# Patient Record
Sex: Female | Born: 1970
Health system: Southern US, Community
[De-identification: ages and names within clinical notes are randomized; demographics above are authoritative.]

## PROBLEM LIST (undated history)

## (undated) DIAGNOSIS — I951 Orthostatic hypotension: Secondary | ICD-10-CM

## (undated) DIAGNOSIS — M62838 Other muscle spasm: Secondary | ICD-10-CM

## (undated) DIAGNOSIS — R000 Tachycardia, unspecified: Secondary | ICD-10-CM

## (undated) DIAGNOSIS — R52 Pain, unspecified: Secondary | ICD-10-CM

## (undated) DIAGNOSIS — G43909 Migraine, unspecified, not intractable, without status migrainosus: Secondary | ICD-10-CM

## (undated) DIAGNOSIS — S3769XA Other injury of uterus, initial encounter: Secondary | ICD-10-CM

## (undated) DIAGNOSIS — I498 Other specified cardiac arrhythmias: Secondary | ICD-10-CM

## (undated) DIAGNOSIS — F319 Bipolar disorder, unspecified: Secondary | ICD-10-CM

## (undated) DIAGNOSIS — R55 Syncope and collapse: Secondary | ICD-10-CM

## (undated) DIAGNOSIS — G90A Postural orthostatic tachycardia syndrome (POTS): Secondary | ICD-10-CM

## (undated) DIAGNOSIS — F410 Panic disorder [episodic paroxysmal anxiety] without agoraphobia: Secondary | ICD-10-CM

## (undated) DIAGNOSIS — M797 Fibromyalgia: Secondary | ICD-10-CM

## (undated) DIAGNOSIS — M1990 Unspecified osteoarthritis, unspecified site: Secondary | ICD-10-CM

## (undated) HISTORY — PX: DILATION AND CURETTAGE OF UTERUS: SHX78

## (undated) HISTORY — DX: Syncope and collapse: R55

## (undated) HISTORY — DX: Other injury of uterus, initial encounter: S37.69XA

## (undated) HISTORY — DX: Other muscle spasm: M62.838

## (undated) HISTORY — DX: Migraine, unspecified, not intractable, without status migrainosus: G43.909

## (undated) HISTORY — DX: Panic disorder (episodic paroxysmal anxiety): F41.0

---

## 1998-11-16 ENCOUNTER — Emergency Department (HOSPITAL_COMMUNITY): Admission: EM | Admit: 1998-11-16 | Discharge: 1998-11-16 | Payer: Self-pay | Admitting: Emergency Medicine

## 1999-09-17 ENCOUNTER — Other Ambulatory Visit: Admission: RE | Admit: 1999-09-17 | Discharge: 1999-09-17 | Payer: Self-pay | Admitting: *Deleted

## 2000-11-16 ENCOUNTER — Other Ambulatory Visit: Admission: RE | Admit: 2000-11-16 | Discharge: 2000-11-16 | Payer: Self-pay | Admitting: *Deleted

## 2001-12-04 ENCOUNTER — Other Ambulatory Visit: Admission: RE | Admit: 2001-12-04 | Discharge: 2001-12-04 | Payer: Self-pay | Admitting: *Deleted

## 2002-12-06 ENCOUNTER — Other Ambulatory Visit: Admission: RE | Admit: 2002-12-06 | Discharge: 2002-12-06 | Payer: Self-pay | Admitting: Obstetrics and Gynecology

## 2004-05-21 ENCOUNTER — Observation Stay (HOSPITAL_COMMUNITY): Admission: AD | Admit: 2004-05-21 | Discharge: 2004-05-22 | Payer: Self-pay | Admitting: Obstetrics & Gynecology

## 2005-02-03 ENCOUNTER — Inpatient Hospital Stay: Payer: Self-pay | Admitting: Unknown Physician Specialty

## 2006-11-27 ENCOUNTER — Other Ambulatory Visit: Payer: Self-pay

## 2006-11-28 ENCOUNTER — Observation Stay: Payer: Self-pay | Admitting: Internal Medicine

## 2007-12-11 ENCOUNTER — Emergency Department: Payer: Self-pay | Admitting: Internal Medicine

## 2008-05-28 ENCOUNTER — Emergency Department: Payer: Self-pay | Admitting: Emergency Medicine

## 2009-03-11 ENCOUNTER — Emergency Department: Payer: Self-pay | Admitting: Emergency Medicine

## 2009-03-24 ENCOUNTER — Emergency Department: Payer: Self-pay | Admitting: Emergency Medicine

## 2009-04-30 ENCOUNTER — Inpatient Hospital Stay: Payer: Self-pay | Admitting: Psychiatry

## 2009-10-05 ENCOUNTER — Ambulatory Visit: Payer: Self-pay | Admitting: Internal Medicine

## 2009-10-05 ENCOUNTER — Emergency Department: Payer: Self-pay | Admitting: Emergency Medicine

## 2009-12-26 ENCOUNTER — Emergency Department: Payer: Self-pay | Admitting: Emergency Medicine

## 2010-01-21 ENCOUNTER — Emergency Department: Payer: Self-pay | Admitting: Internal Medicine

## 2010-06-23 ENCOUNTER — Emergency Department: Payer: Self-pay | Admitting: Emergency Medicine

## 2010-09-30 ENCOUNTER — Other Ambulatory Visit: Payer: Self-pay | Admitting: Obstetrics and Gynecology

## 2010-11-12 ENCOUNTER — Emergency Department: Payer: Self-pay

## 2010-12-02 ENCOUNTER — Emergency Department: Payer: Self-pay | Admitting: Emergency Medicine

## 2010-12-08 ENCOUNTER — Emergency Department: Payer: Self-pay | Admitting: *Deleted

## 2010-12-29 ENCOUNTER — Observation Stay: Payer: Self-pay | Admitting: Internal Medicine

## 2011-02-24 ENCOUNTER — Emergency Department: Payer: Self-pay | Admitting: Emergency Medicine

## 2011-06-02 ENCOUNTER — Inpatient Hospital Stay: Payer: Self-pay | Admitting: Internal Medicine

## 2011-08-08 ENCOUNTER — Emergency Department: Payer: Self-pay | Admitting: Emergency Medicine

## 2011-08-08 LAB — COMPREHENSIVE METABOLIC PANEL
Albumin: 3.8 g/dL (ref 3.4–5.0)
Alkaline Phosphatase: 68 U/L (ref 50–136)
Anion Gap: 10 (ref 7–16)
BUN: 10 mg/dL (ref 7–18)
Bilirubin,Total: 0.3 mg/dL (ref 0.2–1.0)
Calcium, Total: 8.2 mg/dL — ABNORMAL LOW (ref 8.5–10.1)
Creatinine: 0.95 mg/dL (ref 0.60–1.30)
SGPT (ALT): 22 U/L
Total Protein: 6.7 g/dL (ref 6.4–8.2)

## 2011-08-08 LAB — CBC WITH DIFFERENTIAL/PLATELET
Basophil #: 0.1 10*3/uL (ref 0.0–0.1)
Basophil %: 1.1 %
Eosinophil #: 0.1 10*3/uL (ref 0.0–0.7)
Eosinophil %: 0.8 %
HCT: 40.6 % (ref 35.0–47.0)
HGB: 13.6 g/dL (ref 12.0–16.0)
Lymphocyte %: 31 %
MCH: 28.7 pg (ref 26.0–34.0)
MCHC: 33.6 g/dL (ref 32.0–36.0)
MCV: 86 fL (ref 80–100)
Monocyte #: 0.4 10*3/uL (ref 0.0–0.7)
Neutrophil %: 61.8 %
RBC: 4.75 10*6/uL (ref 3.80–5.20)

## 2011-08-27 ENCOUNTER — Emergency Department: Payer: Self-pay | Admitting: Internal Medicine

## 2011-08-27 LAB — URINALYSIS, COMPLETE
Bacteria: NONE SEEN
Bilirubin,UR: NEGATIVE
Glucose,UR: NEGATIVE mg/dL (ref 0–75)
Ketone: NEGATIVE
Leukocyte Esterase: NEGATIVE
Nitrite: NEGATIVE
Ph: 7 (ref 4.5–8.0)
Squamous Epithelial: <1
WBC UR: 2 /HPF (ref 0–5)

## 2011-08-27 LAB — DRUG SCREEN, URINE
Amphetamines, Ur Screen: NEGATIVE (ref ?–1000)
Barbiturates, Ur Screen: NEGATIVE (ref ?–200)
Benzodiazepine, Ur Scrn: NEGATIVE (ref ?–200)
Cocaine Metabolite,Ur ~~LOC~~: NEGATIVE (ref ?–300)
MDMA (Ecstasy)Ur Screen: NEGATIVE (ref ?–500)
Methadone, Ur Screen: NEGATIVE (ref ?–300)
Tricyclic, Ur Screen: NEGATIVE (ref ?–1000)

## 2011-08-27 LAB — COMPREHENSIVE METABOLIC PANEL
Anion Gap: 10 (ref 7–16)
BUN: 8 mg/dL (ref 7–18)
Bilirubin,Total: 0.2 mg/dL (ref 0.2–1.0)
Calcium, Total: 8.1 mg/dL — ABNORMAL LOW (ref 8.5–10.1)
Chloride: 112 mmol/L — ABNORMAL HIGH (ref 98–107)
Co2: 22 mmol/L (ref 21–32)
EGFR (African American): >60
EGFR (Non-African Amer.): >60
Potassium: 3.7 mmol/L (ref 3.5–5.1)
SGOT(AST): 24 U/L (ref 15–37)
SGPT (ALT): 21 U/L
Total Protein: 6.7 g/dL (ref 6.4–8.2)

## 2011-08-27 LAB — CBC
Platelet: 184 10*3/uL (ref 150–440)
RBC: 4.45 10*6/uL (ref 3.80–5.20)
RDW: 15.2 % — ABNORMAL HIGH (ref 11.5–14.5)
WBC: 10 10*3/uL (ref 3.6–11.0)

## 2011-08-27 LAB — HCG, QUANTITATIVE, PREGNANCY: Beta Hcg, Quant.: <1 m[IU]/mL — ABNORMAL LOW

## 2011-08-27 LAB — TROPONIN I: Troponin-I: <0.02 ng/mL

## 2011-08-27 LAB — PREGNANCY, URINE: Pregnancy Test, Urine: NEGATIVE m[IU]/mL

## 2011-08-27 LAB — CK TOTAL AND CKMB (NOT AT ARMC): CK, Total: 65 U/L (ref 21–215)

## 2011-09-10 DIAGNOSIS — F329 Major depressive disorder, single episode, unspecified: Secondary | ICD-10-CM | POA: Insufficient documentation

## 2011-09-10 DIAGNOSIS — K219 Gastro-esophageal reflux disease without esophagitis: Secondary | ICD-10-CM | POA: Insufficient documentation

## 2011-09-10 DIAGNOSIS — G478 Other sleep disorders: Secondary | ICD-10-CM | POA: Insufficient documentation

## 2011-09-10 DIAGNOSIS — R42 Dizziness and giddiness: Secondary | ICD-10-CM | POA: Insufficient documentation

## 2011-09-10 DIAGNOSIS — R5383 Other fatigue: Secondary | ICD-10-CM | POA: Insufficient documentation

## 2011-09-10 DIAGNOSIS — F32A Depression, unspecified: Secondary | ICD-10-CM | POA: Insufficient documentation

## 2011-09-10 DIAGNOSIS — R5381 Other malaise: Secondary | ICD-10-CM | POA: Insufficient documentation

## 2011-09-10 DIAGNOSIS — G479 Sleep disorder, unspecified: Secondary | ICD-10-CM | POA: Insufficient documentation

## 2011-09-20 DIAGNOSIS — R102 Pelvic and perineal pain: Secondary | ICD-10-CM | POA: Insufficient documentation

## 2012-02-03 ENCOUNTER — Inpatient Hospital Stay: Payer: Self-pay | Admitting: Internal Medicine

## 2012-02-03 LAB — CBC WITH DIFFERENTIAL/PLATELET
Basophil #: 0.1 10*3/uL (ref 0.0–0.1)
Eosinophil #: 0.1 10*3/uL (ref 0.0–0.7)
Eosinophil %: 1.5 %
HCT: 39.3 % (ref 35.0–47.0)
Lymphocyte #: 3.2 10*3/uL (ref 1.0–3.6)
MCHC: 32.8 g/dL (ref 32.0–36.0)
MCV: 85 fL (ref 80–100)
Monocyte %: 6.8 %
Neutrophil #: 5.5 10*3/uL (ref 1.4–6.5)
RBC: 4.6 10*6/uL (ref 3.80–5.20)
RDW: 14.7 % — ABNORMAL HIGH (ref 11.5–14.5)
WBC: 9.7 10*3/uL (ref 3.6–11.0)

## 2012-02-03 LAB — COMPREHENSIVE METABOLIC PANEL
Albumin: 3.5 g/dL (ref 3.4–5.0)
Alkaline Phosphatase: 64 U/L (ref 50–136)
Bilirubin,Total: 0.2 mg/dL (ref 0.2–1.0)
Calcium, Total: 8.2 mg/dL — ABNORMAL LOW (ref 8.5–10.1)
Co2: 21 mmol/L (ref 21–32)
Creatinine: 0.99 mg/dL (ref 0.60–1.30)
Glucose: 83 mg/dL (ref 65–99)
Osmolality: 282 (ref 275–301)
SGPT (ALT): 23 U/L (ref 12–78)

## 2012-02-03 LAB — CK TOTAL AND CKMB (NOT AT ARMC): CK-MB: 0.6 ng/mL (ref 0.5–3.6)

## 2012-02-04 LAB — LIPID PANEL
Cholesterol: 203 mg/dL — ABNORMAL HIGH (ref 0–200)
Ldl Cholesterol, Calc: 114 mg/dL — ABNORMAL HIGH (ref 0–100)
Triglycerides: 300 mg/dL — ABNORMAL HIGH (ref 0–200)
VLDL Cholesterol, Calc: 60 mg/dL — ABNORMAL HIGH (ref 5–40)

## 2012-08-28 DIAGNOSIS — H6500 Acute serous otitis media, unspecified ear: Secondary | ICD-10-CM | POA: Insufficient documentation

## 2012-09-12 ENCOUNTER — Encounter (HOSPITAL_COMMUNITY): Payer: Self-pay | Admitting: Emergency Medicine

## 2012-09-12 ENCOUNTER — Emergency Department (HOSPITAL_COMMUNITY)
Admission: EM | Admit: 2012-09-12 | Discharge: 2012-09-12 | Disposition: A | Discharge status: Home / Self Care | Payer: BC Managed Care – PPO | Attending: Emergency Medicine | Admitting: Emergency Medicine

## 2012-09-12 DIAGNOSIS — Z79899 Other long term (current) drug therapy: Secondary | ICD-10-CM | POA: Insufficient documentation

## 2012-09-12 DIAGNOSIS — H9209 Otalgia, unspecified ear: Secondary | ICD-10-CM | POA: Insufficient documentation

## 2012-09-12 DIAGNOSIS — IMO0001 Reserved for inherently not codable concepts without codable children: Secondary | ICD-10-CM | POA: Insufficient documentation

## 2012-09-12 DIAGNOSIS — M542 Cervicalgia: Secondary | ICD-10-CM | POA: Insufficient documentation

## 2012-09-12 DIAGNOSIS — G8929 Other chronic pain: Secondary | ICD-10-CM

## 2012-09-12 DIAGNOSIS — M797 Fibromyalgia: Secondary | ICD-10-CM

## 2012-09-12 DIAGNOSIS — M549 Dorsalgia, unspecified: Secondary | ICD-10-CM | POA: Insufficient documentation

## 2012-09-12 DIAGNOSIS — T733XXA Exhaustion due to excessive exertion, initial encounter: Secondary | ICD-10-CM | POA: Insufficient documentation

## 2012-09-12 DIAGNOSIS — R112 Nausea with vomiting, unspecified: Secondary | ICD-10-CM | POA: Insufficient documentation

## 2012-09-12 DIAGNOSIS — Z792 Long term (current) use of antibiotics: Secondary | ICD-10-CM | POA: Insufficient documentation

## 2012-09-12 DIAGNOSIS — Y93E9 Activity, other interior property and clothing maintenance: Secondary | ICD-10-CM | POA: Insufficient documentation

## 2012-09-12 DIAGNOSIS — R1084 Generalized abdominal pain: Secondary | ICD-10-CM | POA: Insufficient documentation

## 2012-09-12 DIAGNOSIS — Y92009 Unspecified place in unspecified non-institutional (private) residence as the place of occurrence of the external cause: Secondary | ICD-10-CM | POA: Insufficient documentation

## 2012-09-12 DIAGNOSIS — F319 Bipolar disorder, unspecified: Secondary | ICD-10-CM | POA: Insufficient documentation

## 2012-09-12 HISTORY — DX: Fibromyalgia: M79.7

## 2012-09-12 HISTORY — DX: Bipolar disorder, unspecified: F31.9

## 2012-09-12 LAB — COMPREHENSIVE METABOLIC PANEL
ALT: 12 U/L (ref 0–35)
AST: 14 U/L (ref 0–37)
Albumin: 3.5 g/dL (ref 3.5–5.2)
BUN: 8 mg/dL (ref 6–23)
CO2: 20 mEq/L (ref 19–32)
Calcium: 8.8 mg/dL (ref 8.4–10.5)
Chloride: 107 mEq/L (ref 96–112)
Creatinine, Ser: 1 mg/dL (ref 0.50–1.10)
GFR calc non Af Amer: 68 mL/min — ABNORMAL LOW (ref >90)
Glucose, Bld: 85 mg/dL (ref 70–99)
Sodium: 138 mEq/L (ref 135–145)
Total Bilirubin: 0.2 mg/dL — ABNORMAL LOW (ref 0.3–1.2)
Total Protein: 6.6 g/dL (ref 6.0–8.3)

## 2012-09-12 LAB — ACETAMINOPHEN LEVEL: Acetaminophen (Tylenol), Serum: <15 ug/mL (ref 10–30)

## 2012-09-12 MED ORDER — DIAZEPAM 5 MG PO TABS: 5.0000 mg | ORAL_TABLET | Freq: Three times a day (TID) | ORAL | Status: DC | PRN

## 2012-09-12 MED ORDER — HYDROMORPHONE HCL PF 2 MG/ML IJ SOLN
2.0000 mg | INTRAMUSCULAR | Status: AC
  Administered 2012-09-12: 2 mg via INTRAMUSCULAR
  Filled 2012-09-12: qty 1

## 2012-09-12 MED ORDER — OXYCODONE-ACETAMINOPHEN 7.5-325 MG PO TABS: 1.0000 | ORAL_TABLET | ORAL | Status: DC | PRN

## 2012-09-12 MED ORDER — DIAZEPAM 5 MG PO TABS
5.0000 mg | ORAL_TABLET | Freq: Once | ORAL | Status: AC
  Administered 2012-09-12: 5 mg via ORAL
  Filled 2012-09-12: qty 1

## 2012-09-12 NOTE — ED Notes (Signed)
Pt c/o generalized body aches from fibromyalgia x 5 days with home meds not helping; pt told to come by pain clinic

## 2012-09-12 NOTE — ED Provider Notes (Signed)
History    This chart was scribed for non-physician practitioner, Trixie Dredge working with Nelia Shi, MD by Smitty Pluck, ED scribe. This patient was seen in room TR08C/TR08C and the patient's care was started at 5:47 PM.   CSN: [id number]  Arrival date & time 09/12/12  1504      Chief Complaint  Patient presents with  . Generalized Body Aches    The history is provided by the patient. No language interpreter was used.   Melissa [name] is a 42 y.o. female with hx of fibromyalgia (diagnosed 20 years) who presents to the Emergency Department complaining of waxing and waning, aching, severe generalized body aches onset 5 days ago. Pain is rated at 9/10. She reports that the pain will be tolerable for about 1 hour then suddenly return. Pt states that she was doing house work 5 days ago and had gradual worsening after completing the chores, thinks she "overdid it" and that is what has caused her increased pain. She states she has moderate bilateral arm pain, back pain, hip pain, bilateral leg pain and neck pain. States the quality and location of the pain is the same as her normal fibromyalgia pain, different only because it is lasting longer and is more painful than usual.  Pt has had nausea and vomiting onset 2 days ago (two episodes, one yesterday, one today). She mentions that she normally does not have nausea and vomiting with her episodes of fibromyalgia. She states she has taken percocet and tylenol without relief. She reports that she has taken 2 percocet (5-325 mg) and 2 500mg  tylenol every 5 hours for the past 2 days. She was seen by pain management clinic and was told to come to ED if her pain was not controlled by pain medication at home. She mentions having right ear pain due to eustachian tube being blocked. She has taken steroids and antibiotics for ear symptoms but has not had relief. Pt denies fever, chills, sore throat, cough, SOB, abdominal pain, urinary symptoms, abnormal vaginal  discharge, diarrhea,  and any other pain. She has been given dilaudid in the past with relief.    Past Medical History  Diagnosis Date  . Fibromyalgia   . Bipolar disorder     History reviewed. No pertinent past surgical history.  History reviewed. No pertinent family history.  History  Substance Use Topics  . Smoking status: Never Smoker   . Smokeless tobacco: Not on file  . Alcohol Use: No    OB History   Grav Para Term Preterm Abortions TAB SAB Ect Mult Living                  Review of Systems  Constitutional: Negative for fever and chills.  HENT: Positive for neck pain.   Respiratory: Negative for cough and shortness of breath.   Gastrointestinal: Negative for nausea, vomiting and diarrhea.  Musculoskeletal: Positive for back pain and arthralgias.  Neurological: Negative for weakness.    Allergies  Azithromycin; Penicillins; and Sulfa antibiotics  Home Medications   Current Outpatient Rx  Name  Route  Sig  Dispense  Refill  . acetaminophen (TYLENOL) 500 MG tablet   Oral   Take 1,000 mg by mouth every 6 (six) hours as needed for pain.         . clonazePAM (KLONOPIN) 1 MG tablet   Oral   Take 1 mg by mouth 3 (three) times daily as needed for anxiety (or sleep).         Marland Kitchen  fexofenadine (ALLEGRA) 180 MG tablet   Oral   Take 180 mg by mouth daily.         Marland Kitchen gabapentin (NEURONTIN) 300 MG capsule   Oral   Take 600 mg by mouth 2 (two) times daily.         . meclizine (ANTIVERT) 25 MG tablet   Oral   Take 25-50 mg by mouth daily as needed for dizziness.         Marland Kitchen oxyCODONE-acetaminophen (PERCOCET/ROXICET) 5-325 MG per tablet   Oral   Take 1 tablet by mouth every 6 (six) hours as needed for pain.         . ranitidine (ZANTAC) 150 MG tablet   Oral   Take 150 mg by mouth daily.         Marland Kitchen tiZANidine (ZANAFLEX) 4 MG tablet   Oral   Take 4-8 mg by mouth at bedtime.         . topiramate (TOPAMAX) 50 MG tablet   Oral   Take 50 mg by mouth  2 (two) times daily.         . traZODone (DESYREL) 100 MG tablet   Oral   Take 100-200 mg by mouth at bedtime.         Marland Kitchen venlafaxine XR (EFFEXOR-XR) 75 MG 24 hr capsule   Oral   Take 150 mg by mouth every morning.         Marland Kitchen doxycycline (VIBRA-TABS) 100 MG tablet   Oral   Take 100 mg by mouth 2 (two) times daily.           BP 115/83  Pulse 132  Temp(Src) 98.1 F (36.7 C) (Oral)  Resp 18  SpO2 95%  Physical Exam  Nursing note and vitals reviewed. Constitutional: She appears well-developed and well-nourished. No distress.  HENT:  Head: Normocephalic and atraumatic.  Right Ear: Tympanic membrane and ear canal normal.  Neck: Neck supple.  Cardiovascular: Normal rate and regular rhythm.   Pulmonary/Chest: Effort normal and breath sounds normal. No respiratory distress. She has no wheezes. She has no rales.  Abdominal: Soft. She exhibits no distension. There is generalized tenderness (diffuse). There is no rebound and no guarding.  Mild diffuse abdominal tenderness  Musculoskeletal: She exhibits tenderness.  All joints examined.  No erythema, edema, warmth.  Pt is tender to palpation throughout body.  No specific location worse than any other.  No skin changes noted.   Neurological: She is alert.  Skin: She is not diaphoretic.    ED Course  Procedures (including critical care time) DIAGNOSTIC STUDIES: Oxygen Saturation is 95% on room air, adequate by my interpretation.    COORDINATION OF CARE: 5:54 PM Discussed ED treatment with pt and pt agrees.  5:59 PM Ordered:  Medications  HYDROmorphone (DILAUDID) injection 2 mg (not administered)     Labs Reviewed  COMPREHENSIVE METABOLIC PANEL - Abnormal; Notable for the following:    Total Bilirubin 0.2 (*)    GFR calc non Af Amer 68 (*)    GFR calc Af Amer 79 (*)    All other components within normal limits  ACETAMINOPHEN LEVEL   No results found.   1. Fibromyalgia   2. Chronic pain       MDM  Pt with  fibromyalgia with pain exacerbation.  Has been taking percocet and tylenol, taking more than she should.  Discussed risks of overdose with patient and spouse.  6:05 PM Ordered tylenol level and CMP.  APAP level and LFTs are normal.  Pt treated with IM dilaudid.  D/C home with short term increase in her percocet dosage and change from flexeril to valium. Encouraged patient to return for any new symptoms.  No additional symptoms at this time to suggest infection causing exacerbation.  Likely exacerbation due to overexertion with housework as patient suggests.  No concerning findings to suggest septic joints.  Pt is afebrile, nontoxic.   Discussed all results and plan with patient and spouse.  Pt given return precautions.  Pt verbalizes understanding and agrees with plan.       I personally performed the services described in this documentation, which was scribed in my presence. The recorded information has been reviewed and is accurate.    Trixie Dredge, PA-C 09/12/12 1953

## 2012-09-12 NOTE — ED Notes (Signed)
Discussed not performing activities that requires alertness when taking the prescribed medications.

## 2012-09-13 NOTE — ED Provider Notes (Signed)
Medical screening examination/treatment/procedure(s) were performed by non-physician practitioner and as supervising physician I was immediately available for consultation/collaboration.   Nicholson Starace L Altamese Deguire, MD 09/13/12 2240 

## 2012-09-18 ENCOUNTER — Emergency Department (HOSPITAL_COMMUNITY): Payer: BC Managed Care – PPO

## 2012-09-18 ENCOUNTER — Emergency Department (HOSPITAL_COMMUNITY)
Admission: EM | Admit: 2012-09-18 | Discharge: 2012-09-18 | Disposition: A | Discharge status: Home / Self Care | Payer: BC Managed Care – PPO | Attending: Emergency Medicine | Admitting: Emergency Medicine

## 2012-09-18 ENCOUNTER — Encounter (HOSPITAL_COMMUNITY): Payer: Self-pay | Admitting: Emergency Medicine

## 2012-09-18 DIAGNOSIS — F319 Bipolar disorder, unspecified: Secondary | ICD-10-CM | POA: Insufficient documentation

## 2012-09-18 DIAGNOSIS — Z79899 Other long term (current) drug therapy: Secondary | ICD-10-CM | POA: Insufficient documentation

## 2012-09-18 DIAGNOSIS — IMO0001 Reserved for inherently not codable concepts without codable children: Secondary | ICD-10-CM | POA: Insufficient documentation

## 2012-09-18 DIAGNOSIS — R55 Syncope and collapse: Secondary | ICD-10-CM | POA: Insufficient documentation

## 2012-09-18 DIAGNOSIS — R51 Headache: Secondary | ICD-10-CM | POA: Insufficient documentation

## 2012-09-18 LAB — CSF CELL COUNT WITH DIFFERENTIAL
Eosinophils, CSF: NONE SEEN % (ref 0–1)
Tube #: 1
Tube #: 4

## 2012-09-18 LAB — BASIC METABOLIC PANEL WITH GFR
BUN: 8 mg/dL (ref 6–23)
CO2: 20 meq/L (ref 19–32)
Calcium: 8.7 mg/dL (ref 8.4–10.5)
Chloride: 110 meq/L (ref 96–112)
Creatinine, Ser: 0.95 mg/dL (ref 0.50–1.10)
GFR calc Af Amer: 84 mL/min — ABNORMAL LOW
GFR calc non Af Amer: 72 mL/min — ABNORMAL LOW
Glucose, Bld: 91 mg/dL (ref 70–99)
Potassium: 4.7 meq/L (ref 3.5–5.1)
Sodium: 139 meq/L (ref 135–145)

## 2012-09-18 LAB — URINALYSIS, ROUTINE W REFLEX MICROSCOPIC
Bilirubin Urine: NEGATIVE
Glucose, UA: NEGATIVE mg/dL
Hgb urine dipstick: NEGATIVE
Ketones, ur: NEGATIVE mg/dL
Leukocytes, UA: NEGATIVE
Nitrite: NEGATIVE
Protein, ur: NEGATIVE mg/dL
Specific Gravity, Urine: 1.005 (ref 1.005–1.030)
Urobilinogen, UA: 0.2 mg/dL (ref 0.0–1.0)
pH: 6.5 (ref 5.0–8.0)

## 2012-09-18 LAB — CBC WITH DIFFERENTIAL/PLATELET
Basophils Absolute: 0 10*3/uL (ref 0.0–0.1)
Eosinophils Relative: 1 % (ref 0–5)
Lymphocytes Relative: 28 % (ref 12–46)
MCV: 84 fL (ref 78.0–100.0)
Neutro Abs: 5 10*3/uL (ref 1.7–7.7)
Platelets: 241 10*3/uL (ref 150–400)
RDW: 13.7 % (ref 11.5–15.5)
WBC: 7.9 10*3/uL (ref 4.0–10.5)

## 2012-09-18 LAB — POCT I-STAT TROPONIN I

## 2012-09-18 LAB — PROTEIN AND GLUCOSE, CSF
Glucose, CSF: 60 mg/dL (ref 43–76)
Total  Protein, CSF: 27 mg/dL (ref 15–45)

## 2012-09-18 MED ORDER — HYDROMORPHONE HCL PF 1 MG/ML IJ SOLN
1.0000 mg | Freq: Once | INTRAMUSCULAR | Status: AC
  Administered 2012-09-18: 1 mg via INTRAVENOUS
  Filled 2012-09-18: qty 1

## 2012-09-18 MED ORDER — MORPHINE SULFATE 4 MG/ML IJ SOLN
4.0000 mg | Freq: Once | INTRAMUSCULAR | Status: AC
  Administered 2012-09-18: 4 mg via INTRAVENOUS
  Filled 2012-09-18: qty 1

## 2012-09-18 MED ORDER — SODIUM CHLORIDE 0.9 % IV BOLUS (SEPSIS)
1000.0000 mL | Freq: Once | INTRAVENOUS | Status: AC
  Administered 2012-09-18: 1000 mL via INTRAVENOUS

## 2012-09-18 MED ORDER — MORPHINE SULFATE 4 MG/ML IJ SOLN: 4.0000 mg | Freq: Once | INTRAMUSCULAR | Status: DC

## 2012-09-18 MED ORDER — FENTANYL CITRATE 0.05 MG/ML IJ SOLN
100.0000 ug | Freq: Once | INTRAMUSCULAR | Status: AC
  Administered 2012-09-18: 100 ug via INTRAVENOUS
  Filled 2012-09-18: qty 2

## 2012-09-18 NOTE — ED Notes (Signed)
Pt states numbness around mouth; pt alert and oriented at this time; pt c/o dizziness and lightheadedness; pt denies fever; pt denies n/v/d; family at bedside with pt

## 2012-09-18 NOTE — ED Notes (Signed)
Husband at bedside and PA at bedside assessing pt

## 2012-09-18 NOTE — ED Notes (Signed)
Pt states she still feels dizzy; pt denies numbness and tingling; pt alert and mentating appropriately; NAD noted currently

## 2012-09-18 NOTE — ED Notes (Signed)
post lumbar punture; pt able to move all extremities; no swelling or discoloration noted at puncture site; pt states she is a little dizzy; no drainage noted at site; bandaid in place; pt alert and mentating appropriately; pt c/o pain

## 2012-09-18 NOTE — Procedures (Signed)
LP R L3-4 8 cc clear CSF Opening P 14 cm water No comp

## 2012-09-18 NOTE — ED Provider Notes (Signed)
History     CSN: [id number]  Arrival date & time 09/18/12  1500   First MD Initiated Contact with Patient 09/18/12 1503      Chief Complaint  Patient presents with  . Loss of Consciousness    (Consider location/radiation/quality/duration/timing/severity/associated sxs/prior treatment) HPI Comments: Pt presenting to the ED via EMS for two syncopal episodes occuring PTA.  Pt states she was eating lunch with her husband when she began feeling weak.  States he escorted her to the bathroom.  When she went to pull up her pants, she began having a sudden headache along her left temple.  Headache is without visual disturbance, photophobia, or phonophobia.  Started a new medication today, naltrexone, from her pain management physician.  States she did not want to come to the ED but EMS encouraged her to do so.  Pt has long hx of syncopal episodes evaluated with prior head MRI and CT at [facility] without explanation of her sx.  Last MRI 03/2012.  Right AOM present x 2 months- 2 courses of abx from PCP without resolution of sx.  Denies any chest pain, SOB, abdominal pain, nausea, vomiting, diarrhea, fever, chills, or dysuria.  Husband notes that she has not had a healthy appetite lately.  The history is provided by the patient.    Past Medical History  Diagnosis Date  . Fibromyalgia   . Bipolar disorder     History reviewed. No pertinent past surgical history.  History reviewed. No pertinent family history.  History  Substance Use Topics  . Smoking status: Never Smoker   . Smokeless tobacco: Not on file  . Alcohol Use: No    OB History   Grav Para Term Preterm Abortions TAB SAB Ect Mult Living                  Review of Systems  Neurological: Positive for syncope.  All other systems reviewed and are negative.    Allergies  Azithromycin; Penicillins; and Sulfa antibiotics  Home Medications   Current Outpatient Rx  Name  Route  Sig  Dispense  Refill  . acetaminophen (TYLENOL)  500 MG tablet   Oral   Take 1,000 mg by mouth every 6 (six) hours as needed for pain.         . clonazePAM (KLONOPIN) 1 MG tablet   Oral   Take 1 mg by mouth 3 (three) times daily as needed for anxiety (or sleep).         . diazepam (VALIUM) 5 MG tablet   Oral   Take 1 tablet (5 mg total) by mouth every 8 (eight) hours as needed (muscle spasm or pain).   10 tablet   0   . doxycycline (VIBRA-TABS) 100 MG tablet   Oral   Take 100 mg by mouth 2 (two) times daily.         . fexofenadine (ALLEGRA) 180 MG tablet   Oral   Take 180 mg by mouth daily.         Marland Kitchen gabapentin (NEURONTIN) 300 MG capsule   Oral   Take 600 mg by mouth 2 (two) times daily.         . meclizine (ANTIVERT) 25 MG tablet   Oral   Take 25-50 mg by mouth daily as needed for dizziness.         Marland Kitchen oxyCODONE-acetaminophen (PERCOCET) 7.5-325 MG per tablet   Oral   Take 1-2 tablets by mouth every 4 (four) hours as needed for  pain.   8 tablet   0   . ranitidine (ZANTAC) 150 MG tablet   Oral   Take 150 mg by mouth daily.         Marland Kitchen tiZANidine (ZANAFLEX) 4 MG tablet   Oral   Take 4-8 mg by mouth at bedtime.         . topiramate (TOPAMAX) 50 MG tablet   Oral   Take 50 mg by mouth 2 (two) times daily.         . traZODone (DESYREL) 100 MG tablet   Oral   Take 100-200 mg by mouth at bedtime.         Marland Kitchen venlafaxine XR (EFFEXOR-XR) 75 MG 24 hr capsule   Oral   Take 150 mg by mouth every morning.           BP 110/72  Pulse 104  Temp(Src) 97.7 F (36.5 C) (Oral)  Resp 21  SpO2 98%  Physical Exam  Nursing note and vitals reviewed. Constitutional: She is oriented to person, place, and time. She appears well-developed and well-nourished.  HENT:  Head: Normocephalic and atraumatic.  Mouth/Throat: Oropharynx is clear and moist.  Eyes: Conjunctivae and EOM are normal. Pupils are equal, round, and reactive to light.  Neck: Normal range of motion. Neck supple.  Cardiovascular: Normal rate,  regular rhythm and normal heart sounds.   Pulmonary/Chest: Effort normal and breath sounds normal. She has no wheezes.  Abdominal: Soft. Bowel sounds are normal. There is no tenderness. There is no guarding.  Musculoskeletal: Normal range of motion. She exhibits no edema.  Neurological: She is alert and oriented to person, place, and time. She has normal strength. She displays no tremor. No cranial nerve deficit or sensory deficit. She displays no seizure activity.  Pt awake and oriented, speaking in full sentences; Normal strength UE and LE bilaterally, no facial droop appreciated  Skin: Skin is warm and dry.  Psychiatric: She has a normal mood and affect.    ED Course  Procedures (including critical care time)   Date: 09/18/2012  Rate: 104  Rhythm: sinus tachycardia  QRS Axis: normal  Intervals: normal  ST/T Wave abnormalities: normal  Conduction Disutrbances:none  Narrative Interpretation:   Old EKG Reviewed: none available   Labs Reviewed  BASIC METABOLIC PANEL - Abnormal; Notable for the following:    GFR calc non Af Amer 72 (*)    GFR calc Af Amer 84 (*)    All other components within normal limits  CSF CELL COUNT WITH DIFFERENTIAL - Abnormal; Notable for the following:    RBC Count, CSF 3 (*)    All other components within normal limits  CSF CELL COUNT WITH DIFFERENTIAL - Abnormal; Notable for the following:    RBC Count, CSF 1 (*)    All other components within normal limits  GRAM STAIN  CSF CULTURE  URINALYSIS, ROUTINE W REFLEX MICROSCOPIC  PROTEIN AND GLUCOSE, CSF  CBC WITH DIFFERENTIAL  CBC WITH DIFFERENTIAL  POCT I-STAT TROPONIN I   Ct Head Wo Contrast  09/18/2012  *RADIOLOGY REPORT*  Clinical Data: Perioral numbness.  Dizziness.  Lightheadedness. Headache.  Syncope.  CT HEAD WITHOUT CONTRAST  Technique:  Contiguous axial images were obtained from the base of the skull through the vertex without contrast.  Comparison: None.  Findings: Right-sided mastoid  effusion fills the mastoid air cells. Greater than expected soft tissue density along the right ossicular chain, suspicious for otitis media.  No CT findings of otitis externa.  No asymmetric soft tissue stranding or bony destructive findings in the mastoid.  The brain stem, cerebellum, cerebral peduncles, thalami, basal ganglia, basilar cisterns, and ventricular system appear unremarkable.  No intracranial hemorrhage, mass lesion, or acute infarction is identified.  Partial opacification of several left sided ethmoid air cells as on image 13 of series 3.  IMPRESSION:  1.  Right mastoid effusion, possibly with a small amount of fluid in the right middle ear. 2.  Minimal left ethmoid chronic sinusitis. 3.   Otherwise, no significant abnormality identified.   Original Report Authenticated By: Gaylyn Rong, M.D.    Dg Fluoro Guide Lumbar Puncture  09/18/2012  *RADIOLOGY REPORT*  Clinical Data:  Severe headache  DIAGNOSTIC LUMBAR PUNCTURE UNDER FLUOROSCOPIC GUIDANCE  Fluoroscopy time:  1.0 minutes.  Technique:  Informed consent was obtained from the patient prior to the procedure, including potential complications of headache, allergy, and pain.   With the patient prone, the lower back was prepped with Betadine.  1% Lidocaine was used for local anesthesia. Lumbar puncture was performed at the right L3-4 level using a 20 gauge needle with return of clear CSF with an opening pressure of 14 cm water.   Eight ml of CSF were obtained for laboratory studies.  The patient tolerated the procedure well and there were no apparent complications.  IMPRESSION: Successful lumbar puncture for fluid.   Original Report Authenticated By: Jolaine Click, M.D.      1. Syncopal episodes       MDM   Pt with recurrent syncopal episodes over the past few months, evaluated by head CT and MRI without explanation to her sx.  Concern for [facility] given sudden onset of headache earlier today without trauma.  CT head, LP, and cardiac  work-up negative.  Labs largely normal.  No further syncopal episodes in the ED.  VS stable.  Pt ok for discharge.  Pt on several sedating medications as well as a new medication, naltrexone- FU with PCP to discuss these issues.  Discussed with Dr. Manus Gunning who agrees with plan.  Return precautions advised.        Garlon Hatchet, PA-C 09/18/12 2348

## 2012-09-18 NOTE — ED Notes (Signed)
Per EMS: pt fainted twice; pt took naltrexone, this was her first time taking this medication and states she did not feel well; pt went back to [facility] to ask about medication was helped to floor by husband states passed out and came to with sternal rub; pt passed out again with EMS and responded only to sternal rub again; pt has hx of passing out/fainting and has been diagnosed with anything; normal 12 lead; BP 105/70 pulse 90 regular CBG 82; pt c/o headache; pt alert oriented x 4; pt did not originally want to come to ED

## 2012-09-18 NOTE — ED Notes (Signed)
Pt alert and mentating appropriately upon d/c; pt given d/c teaching and follow up care instructions; pt verbalizes understanding of d/c teaching and has no further questions upon d/c. NAD noted upon d/c. Pt brought to husband's vehicle in wheelchair; pt instructed not to drive and husband is driving home

## 2012-09-18 NOTE — ED Notes (Signed)
Urine was taken at 3:07pm JG.

## 2012-09-19 NOTE — ED Provider Notes (Signed)
Medical screening examination/treatment/procedure(s) were conducted as a shared visit with non-physician practitioner(s) and myself.  I personally evaluated the patient during the encounter  Sudden onset headache today followed by syncopal episode.  Previous history of syncopal episodes. HA is new.  CN 2-12 intact, 5/5 strength throughout, no ataxia on finger to nose.  CT/LP given sudden onset headache and syncope.  Glynn Octave, MD 09/19/12 [place] Manners

## 2012-09-22 LAB — CSF CULTURE W GRAM STAIN

## 2012-10-05 DIAGNOSIS — E782 Mixed hyperlipidemia: Secondary | ICD-10-CM | POA: Insufficient documentation

## 2012-10-05 DIAGNOSIS — R799 Abnormal finding of blood chemistry, unspecified: Secondary | ICD-10-CM | POA: Insufficient documentation

## 2012-10-05 DIAGNOSIS — J309 Allergic rhinitis, unspecified: Secondary | ICD-10-CM | POA: Insufficient documentation

## 2012-10-05 DIAGNOSIS — F3170 Bipolar disorder, currently in remission, most recent episode unspecified: Secondary | ICD-10-CM | POA: Insufficient documentation

## 2012-10-05 DIAGNOSIS — Z3009 Encounter for other general counseling and advice on contraception: Secondary | ICD-10-CM | POA: Insufficient documentation

## 2012-11-02 ENCOUNTER — Emergency Department: Payer: Self-pay | Admitting: Emergency Medicine

## 2012-11-02 LAB — TROPONIN I: Troponin-I: <0.02 ng/mL

## 2012-11-02 LAB — BASIC METABOLIC PANEL
Anion Gap: 8 (ref 7–16)
BUN: 13 mg/dL (ref 7–18)
Calcium, Total: 8.7 mg/dL (ref 8.5–10.1)
Chloride: 112 mmol/L — ABNORMAL HIGH (ref 98–107)
EGFR (African American): >60
Glucose: 116 mg/dL — ABNORMAL HIGH (ref 65–99)
Osmolality: 282 (ref 275–301)

## 2012-11-02 LAB — URINALYSIS, COMPLETE
Blood: NEGATIVE
Ketone: NEGATIVE
Nitrite: NEGATIVE
Ph: 7 (ref 4.5–8.0)
RBC,UR: 1 /HPF (ref 0–5)
Specific Gravity: 1.004 (ref 1.003–1.030)
Squamous Epithelial: 1
WBC UR: 7 /HPF (ref 0–5)

## 2012-11-02 LAB — CBC
HGB: 14 g/dL (ref 12.0–16.0)
RBC: 4.93 10*6/uL (ref 3.80–5.20)

## 2012-11-04 ENCOUNTER — Observation Stay: Payer: Self-pay | Admitting: Internal Medicine

## 2012-11-04 LAB — COMPREHENSIVE METABOLIC PANEL
Albumin: 3.6 g/dL (ref 3.4–5.0)
Alkaline Phosphatase: 70 U/L (ref 50–136)
Anion Gap: 11 (ref 7–16)
Bilirubin,Total: 0.2 mg/dL (ref 0.2–1.0)
Calcium, Total: 8.3 mg/dL — ABNORMAL LOW (ref 8.5–10.1)
Chloride: 110 mmol/L — ABNORMAL HIGH (ref 98–107)
Co2: 20 mmol/L — ABNORMAL LOW (ref 21–32)
Creatinine: 1.06 mg/dL (ref 0.60–1.30)
EGFR (African American): >60
EGFR (Non-African Amer.): >60
Glucose: 150 mg/dL — ABNORMAL HIGH (ref 65–99)
Sodium: 141 mmol/L (ref 136–145)
Total Protein: 6.8 g/dL (ref 6.4–8.2)

## 2012-11-04 LAB — URINALYSIS, COMPLETE
Bacteria: NONE SEEN
Bilirubin,UR: NEGATIVE
Blood: NEGATIVE
Ketone: NEGATIVE
Ph: 7 (ref 4.5–8.0)
Protein: NEGATIVE
RBC,UR: 1 /HPF (ref 0–5)
Squamous Epithelial: 1

## 2012-11-04 LAB — PREGNANCY, URINE: Pregnancy Test, Urine: NEGATIVE m[IU]/mL

## 2012-11-04 LAB — CBC
HCT: 41.8 % (ref 35.0–47.0)
HGB: 14 g/dL (ref 12.0–16.0)
Platelet: 267 10*3/uL (ref 150–440)
RBC: 5.02 10*6/uL (ref 3.80–5.20)
WBC: 15.6 10*3/uL — ABNORMAL HIGH (ref 3.6–11.0)

## 2012-11-04 LAB — TROPONIN I: Troponin-I: <0.02 ng/mL

## 2012-11-04 LAB — CK TOTAL AND CKMB (NOT AT ARMC): CK-MB: <0.5 ng/mL — ABNORMAL LOW (ref 0.5–3.6)

## 2012-11-05 LAB — BASIC METABOLIC PANEL
BUN: 14 mg/dL (ref 7–18)
Calcium, Total: 8.1 mg/dL — ABNORMAL LOW (ref 8.5–10.1)
Co2: 23 mmol/L (ref 21–32)
Creatinine: 1.03 mg/dL (ref 0.60–1.30)
Glucose: 79 mg/dL (ref 65–99)
Osmolality: 283 (ref 275–301)
Potassium: 3.9 mmol/L (ref 3.5–5.1)
Sodium: 142 mmol/L (ref 136–145)

## 2012-11-05 LAB — CBC WITH DIFFERENTIAL/PLATELET
Basophil %: 0.4 %
Eosinophil #: 0.1 10*3/uL (ref 0.0–0.7)
Eosinophil %: 0.6 %
Lymphocyte #: 4.2 10*3/uL — ABNORMAL HIGH (ref 1.0–3.6)
Lymphocyte %: 32.5 %
MCH: 28.7 pg (ref 26.0–34.0)
MCHC: 33.9 g/dL (ref 32.0–36.0)
MCV: 85 fL (ref 80–100)
Neutrophil %: 59.2 %
RDW: 14.7 % — ABNORMAL HIGH (ref 11.5–14.5)
WBC: 13 10*3/uL — ABNORMAL HIGH (ref 3.6–11.0)

## 2012-11-06 LAB — CK: CK, Total: 31 U/L (ref 21–215)

## 2012-11-28 ENCOUNTER — Encounter: Payer: Self-pay | Admitting: Family Medicine

## 2012-11-28 ENCOUNTER — Ambulatory Visit (INDEPENDENT_AMBULATORY_CARE_PROVIDER_SITE_OTHER): Payer: BC Managed Care – PPO | Admitting: Family Medicine

## 2012-11-28 VITALS — BP 110/72 | HR 96 | Temp 97.8°F | Ht 69.0 in | Wt 147.5 lb

## 2012-11-28 DIAGNOSIS — IMO0001 Reserved for inherently not codable concepts without codable children: Secondary | ICD-10-CM

## 2012-11-28 DIAGNOSIS — F319 Bipolar disorder, unspecified: Secondary | ICD-10-CM

## 2012-11-28 DIAGNOSIS — Z309 Encounter for contraceptive management, unspecified: Secondary | ICD-10-CM

## 2012-11-28 DIAGNOSIS — R55 Syncope and collapse: Secondary | ICD-10-CM

## 2012-11-28 DIAGNOSIS — M797 Fibromyalgia: Secondary | ICD-10-CM

## 2012-11-28 DIAGNOSIS — G43909 Migraine, unspecified, not intractable, without status migrainosus: Secondary | ICD-10-CM

## 2012-11-28 NOTE — Patient Instructions (Addendum)
We'll request your records and then go from there.  Take care.

## 2012-11-29 ENCOUNTER — Encounter: Payer: Self-pay | Admitting: Family Medicine

## 2012-11-29 DIAGNOSIS — G43909 Migraine, unspecified, not intractable, without status migrainosus: Secondary | ICD-10-CM | POA: Insufficient documentation

## 2012-11-29 DIAGNOSIS — Z309 Encounter for contraceptive management, unspecified: Secondary | ICD-10-CM | POA: Insufficient documentation

## 2012-11-29 DIAGNOSIS — R55 Syncope and collapse: Secondary | ICD-10-CM | POA: Insufficient documentation

## 2012-11-29 NOTE — Assessment & Plan Note (Signed)
No change in meds.   Requesting records.  I'll have to review before we do anything further.   

## 2012-11-29 NOTE — Assessment & Plan Note (Addendum)
With f/u pending.  She'll have [facility] send records.  >30 min spent with face to face with patient, >50% counseling and/or coordinating care.  She agrees with the plan for me to review records first.

## 2012-11-29 NOTE — Progress Notes (Signed)
New patient.  To est care.   Chronic pain/fibromyalgia.  Followed by [facility] pain clinic.  Requesting records.  Longstanding diffuse pain.    Syncope. Mult episodes, f/u at [facility] syncope clinic pending.   BAD per psych.   Requesting records.  In counseling.    On depo for ~10 years per patient.  Has seen gyn clinic.   Requesting records.  Recently admitted to [facility] with weakness.  Unclear cause per patient.   Requesting records.  Improving some, using a walker now.   PMH and SH reviewed  ROS: See HPI, otherwise noncontributory.  Meds, vitals, and allergies reviewed.   nad ncat Mmm rrr ctab Abd soft, not ttp Ext w/o edema Speech fluent.

## 2012-11-29 NOTE — Assessment & Plan Note (Signed)
Improved with topamax.  Much less frequent.  No change in meds.   Requesting records.  I'll have to review before we do anything further.

## 2012-11-29 NOTE — Assessment & Plan Note (Signed)
No change in meds.   Requesting records.  I'll have to review before we do anything further.

## 2012-12-09 ENCOUNTER — Encounter (HOSPITAL_COMMUNITY): Payer: Self-pay | Admitting: *Deleted

## 2012-12-09 ENCOUNTER — Emergency Department (HOSPITAL_COMMUNITY)
Admission: EM | Admit: 2012-12-09 | Discharge: 2012-12-10 | Disposition: A | Discharge status: Home / Self Care | Payer: BC Managed Care – PPO | Attending: Emergency Medicine | Admitting: Emergency Medicine

## 2012-12-09 DIAGNOSIS — Z880 Allergy status to penicillin: Secondary | ICD-10-CM | POA: Insufficient documentation

## 2012-12-09 DIAGNOSIS — F319 Bipolar disorder, unspecified: Secondary | ICD-10-CM | POA: Insufficient documentation

## 2012-12-09 DIAGNOSIS — F410 Panic disorder [episodic paroxysmal anxiety] without agoraphobia: Secondary | ICD-10-CM | POA: Insufficient documentation

## 2012-12-09 DIAGNOSIS — Z8739 Personal history of other diseases of the musculoskeletal system and connective tissue: Secondary | ICD-10-CM | POA: Insufficient documentation

## 2012-12-09 DIAGNOSIS — Z79899 Other long term (current) drug therapy: Secondary | ICD-10-CM | POA: Insufficient documentation

## 2012-12-09 DIAGNOSIS — R42 Dizziness and giddiness: Secondary | ICD-10-CM | POA: Insufficient documentation

## 2012-12-09 DIAGNOSIS — Z87828 Personal history of other (healed) physical injury and trauma: Secondary | ICD-10-CM | POA: Insufficient documentation

## 2012-12-09 DIAGNOSIS — R5381 Other malaise: Secondary | ICD-10-CM | POA: Insufficient documentation

## 2012-12-09 DIAGNOSIS — R55 Syncope and collapse: Secondary | ICD-10-CM | POA: Insufficient documentation

## 2012-12-09 DIAGNOSIS — G43909 Migraine, unspecified, not intractable, without status migrainosus: Secondary | ICD-10-CM | POA: Insufficient documentation

## 2012-12-09 DIAGNOSIS — E860 Dehydration: Secondary | ICD-10-CM | POA: Insufficient documentation

## 2012-12-09 LAB — POCT I-STAT, CHEM 8
BUN: 9 mg/dL (ref 6–23)
HCT: 42 % (ref 36.0–46.0)
Sodium: 143 mEq/L (ref 135–145)
TCO2: 19 mmol/L (ref 0–100)

## 2012-12-09 MED ORDER — SODIUM CHLORIDE 0.9 % IV BOLUS (SEPSIS)
1000.0000 mL | Freq: Once | INTRAVENOUS | Status: AC
  Administered 2012-12-09: 1000 mL via INTRAVENOUS

## 2012-12-09 NOTE — ED Provider Notes (Signed)
History     CSN: [id number]  Arrival date & time 12/09/12  1818   First MD Initiated Contact with Patient 12/09/12 2140      Chief Complaint  Patient presents with  . Near Syncope   HPI  History provided by the patient. Patient is a 42 year old female with a history of bipolar disorder, fibromyalgia, POTS presents with complaints of feeling dehydrated and near syncopal. Patient states she has been large part of the day outside and has not been drinking good amounts of water. She is felt weak and dehydrated. She has also felt lightheaded and like passing out when she stands. This is similar to her previous episodes of syncope and she believes is related to her POTS. She denies having any chest pain or shortness of breath. She denies any headache, numbness or weakness. No other aggravating or alleviating factors. No other associated symptoms. No fever, chills or sweats. No nausea or vomiting.    Past Medical History  Diagnosis Date  . Bipolar disorder   . Fibromyalgia     on disability  . Migraine   . Panic attack   . Muscle spasm   . Uterine perforation     hx of  . Syncope     Past Surgical History  Procedure Laterality Date  . Dilation and curettage of uterus      Family History  Problem Relation Age of Onset  . Diabetes Mother   . Hypertension Mother   . Cancer Father     brain tumor    History  Substance Use Topics  . Smoking status: Never Smoker   . Smokeless tobacco: Not on file  . Alcohol Use: No    OB History   Grav Para Term Preterm Abortions TAB SAB Ect Mult Living                  Review of Systems  Constitutional: Negative for fever, chills and diaphoresis.  Respiratory: Negative for shortness of breath.   Cardiovascular: Negative for chest pain.  Neurological: Positive for light-headedness. Negative for syncope, weakness, numbness and headaches.  All other systems reviewed and are negative.    Allergies  Azithromycin; Depakote; Lithium;  Penicillins; Seroquel; and Sulfa antibiotics  Home Medications   Current Outpatient Rx  Name  Route  Sig  Dispense  Refill  . acetaminophen (TYLENOL) 500 MG tablet   Oral   Take 1,000 mg by mouth every 6 (six) hours as needed for pain.         . clonazePAM (KLONOPIN) 1 MG tablet   Oral   Take 1 mg by mouth 3 (three) times daily as needed for anxiety (or sleep).         . fexofenadine (ALLEGRA) 180 MG tablet   Oral   Take 180 mg by mouth daily.         Marland Kitchen gabapentin (NEURONTIN) 300 MG capsule   Oral   Take 600 mg by mouth 2 (two) times daily.         . medroxyPROGESTERone (DEPO-PROVERA) 150 MG/ML injection   Intramuscular   Inject 150 mg into the muscle every 3 (three) months.         Marland Kitchen NALTREXONE HCL PO   Oral   Take 4.5 mg by mouth daily. Pt has medication compounded         . oxyCODONE-acetaminophen (PERCOCET/ROXICET) 5-325 MG per tablet   Oral   Take 1 tablet by mouth every 6 (six) hours as needed  for pain.         . ranitidine (ZANTAC) 150 MG tablet   Oral   Take 150 mg by mouth daily.         . SUMAtriptan (IMITREX) 100 MG tablet   Oral   Take 100 mg by mouth daily as needed for migraine.         Marland Kitchen tiZANidine (ZANAFLEX) 4 MG tablet   Oral   Take 12 mg by mouth at bedtime.          . topiramate (TOPAMAX) 50 MG tablet   Oral   Take 100 mg by mouth daily.          . traZODone (DESYREL) 100 MG tablet   Oral   Take 200 mg by mouth at bedtime.          Marland Kitchen venlafaxine XR (EFFEXOR-XR) 75 MG 24 hr capsule   Oral   Take 150 mg by mouth every morning.           BP 110/63  Pulse 109  Temp(Src) 98.4 F (36.9 C) (Oral)  Resp 16  SpO2 98%  Physical Exam  Nursing note and vitals reviewed. Constitutional: She is oriented to person, place, and time. She appears well-developed and well-nourished. No distress.  HENT:  Head: Normocephalic.  Cardiovascular: Normal rate and regular rhythm.   No murmur heard. Pulmonary/Chest: Effort  normal and breath sounds normal. No respiratory distress. She has no wheezes. She has no rales.  Abdominal: Soft. There is no tenderness.  Musculoskeletal: Normal range of motion. She exhibits no edema and no tenderness.  Neurological: She is alert and oriented to person, place, and time.  Skin: Skin is warm and dry. No rash noted.  Psychiatric: She has a normal mood and affect. Her behavior is normal.    ED Course  Procedures   Results for orders placed during the hospital encounter of 12/09/12  POCT I-STAT, CHEM 8      Result Value Range   Sodium 143  135 - 145 mEq/L   Potassium 3.8  3.5 - 5.1 mEq/L   Chloride 111  96 - 112 mEq/L   BUN 9  6 - 23 mg/dL   Creatinine, Ser 2.95 (*) 0.50 - 1.10 mg/dL   Glucose, Bld 98  70 - 99 mg/dL   Calcium, Ion 6.21  3.08 - 1.23 mmol/L   TCO2 19  0 - 100 mmol/L   Hemoglobin 14.3  12.0 - 15.0 g/dL   HCT 65.7  84.6 - 96.2 %       1. Dehydration   2. Near syncope       MDM  9:40PM patient seen and evaluated. Patient resting comfortably appears in no acute distress.  Symptoms to her she'll like dehydration similar to her pots syndrome. Will rehydrate with IV fluids.  Patient feeling much better after 2 L of fluids. She is now ready to return home.   Date: 12/09/2012  Rate: 102  Rhythm: sinus tachycardia  QRS Axis: right  Intervals: normal  ST/T Wave abnormalities: normal  Conduction Disutrbances:none  Narrative Interpretation:   Old EKG Reviewed: unchanged     Angus Seller, PA-C 12/09/12 2351

## 2012-12-09 NOTE — ED Notes (Signed)
Discontinued IV Line.

## 2012-12-09 NOTE — ED Notes (Signed)
After walking and being up for over five mins pt's heart rate was 101 when I checked it, also o2 is 100% on room air. 10:00pm JG.

## 2012-12-09 NOTE — ED Notes (Signed)
Pt reports having near syncopal episodes x 4 years, recently diagnosed with POTS. Reports not drinking enough fluids today and being out in the sun, feeling weak and dehydrated.

## 2012-12-12 NOTE — ED Provider Notes (Signed)
Medical screening examination/treatment/procedure(s) were performed by non-physician practitioner and as supervising physician I was immediately available for consultation/collaboration.   Marly Schuld, MD 12/12/12 2336 

## 2012-12-17 ENCOUNTER — Telehealth: Payer: Self-pay | Admitting: Family Medicine

## 2012-12-17 NOTE — Telephone Encounter (Signed)
Call pt.  I reviewed records from [facility] pain clinic, GYN clinic and [facility].  I can follow for general medical concerns.  However, she'll need to follow with psych clinic for BAD and her psych meds, pain clinic for her pain meds, and gyn clinic re: depo (vs alternative).  I am awaiting [facility] syncope clinic notes.  I don't have anything to offer at this point.

## 2012-12-18 ENCOUNTER — Telehealth: Payer: Self-pay | Admitting: Family Medicine

## 2012-12-18 NOTE — Telephone Encounter (Signed)
Patient was seen for her initial visit and you reviewed her old records.  Patient is calling saying that your personalities didn't click and she needs a doctor that is more lighthearted.  Patient said she would like to see another doctor here.  Patient said it's fine for her to see her specialists like you advised.  Please advise about her seeing another provider.

## 2012-12-18 NOTE — Telephone Encounter (Signed)
Left detailed message on voicemail.  

## 2012-12-19 NOTE — Telephone Encounter (Addendum)
Remove me from the PCP line.  She'll need to make an appointment with another MD.  I have no preference about who she establishes with.

## 2012-12-19 NOTE — Telephone Encounter (Signed)
Yes I can see her at my first available new pt appointment.

## 2012-12-19 NOTE — Telephone Encounter (Signed)
Dr.Aron can you see this patient?  Please see the phone note below.  If you do agree to see her, does she wait until your next new patient opening in September or can she just switch to you?

## 2012-12-21 ENCOUNTER — Emergency Department: Payer: Self-pay | Admitting: Emergency Medicine

## 2012-12-21 LAB — COMPREHENSIVE METABOLIC PANEL
Anion Gap: 8 (ref 7–16)
BUN: 7 mg/dL (ref 7–18)
Calcium, Total: 8.2 mg/dL — ABNORMAL LOW (ref 8.5–10.1)
Creatinine: 1.2 mg/dL (ref 0.60–1.30)
EGFR (Non-African Amer.): 55 — ABNORMAL LOW
Osmolality: 281 (ref 275–301)
SGOT(AST): 19 U/L (ref 15–37)
SGPT (ALT): 21 U/L (ref 12–78)
Sodium: 141 mmol/L (ref 136–145)
Total Protein: 6.9 g/dL (ref 6.4–8.2)

## 2012-12-21 LAB — CBC
HCT: 39.1 % (ref 35.0–47.0)
MCV: 83 fL (ref 80–100)
RDW: 14.9 % — ABNORMAL HIGH (ref 11.5–14.5)
WBC: 8.8 10*3/uL (ref 3.6–11.0)

## 2012-12-26 ENCOUNTER — Encounter: Payer: Self-pay | Admitting: Family Medicine

## 2012-12-27 ENCOUNTER — Emergency Department: Payer: Self-pay | Admitting: Emergency Medicine

## 2013-01-03 ENCOUNTER — Emergency Department: Payer: Self-pay | Admitting: Internal Medicine

## 2013-01-17 ENCOUNTER — Emergency Department (HOSPITAL_COMMUNITY)
Admission: EM | Admit: 2013-01-17 | Discharge: 2013-01-17 | Disposition: A | Discharge status: Home / Self Care | Payer: BC Managed Care – PPO | Attending: Emergency Medicine | Admitting: Emergency Medicine

## 2013-01-17 ENCOUNTER — Encounter (HOSPITAL_COMMUNITY): Payer: Self-pay | Admitting: *Deleted

## 2013-01-17 DIAGNOSIS — R42 Dizziness and giddiness: Secondary | ICD-10-CM | POA: Insufficient documentation

## 2013-01-17 DIAGNOSIS — E860 Dehydration: Secondary | ICD-10-CM | POA: Insufficient documentation

## 2013-01-17 DIAGNOSIS — Z880 Allergy status to penicillin: Secondary | ICD-10-CM | POA: Insufficient documentation

## 2013-01-17 DIAGNOSIS — Z79899 Other long term (current) drug therapy: Secondary | ICD-10-CM | POA: Insufficient documentation

## 2013-01-17 DIAGNOSIS — F319 Bipolar disorder, unspecified: Secondary | ICD-10-CM | POA: Insufficient documentation

## 2013-01-17 DIAGNOSIS — F410 Panic disorder [episodic paroxysmal anxiety] without agoraphobia: Secondary | ICD-10-CM | POA: Insufficient documentation

## 2013-01-17 DIAGNOSIS — Z8739 Personal history of other diseases of the musculoskeletal system and connective tissue: Secondary | ICD-10-CM | POA: Insufficient documentation

## 2013-01-17 DIAGNOSIS — I498 Other specified cardiac arrhythmias: Secondary | ICD-10-CM | POA: Insufficient documentation

## 2013-01-17 DIAGNOSIS — G43909 Migraine, unspecified, not intractable, without status migrainosus: Secondary | ICD-10-CM | POA: Insufficient documentation

## 2013-01-17 DIAGNOSIS — G90A Postural orthostatic tachycardia syndrome (POTS): Secondary | ICD-10-CM

## 2013-01-17 HISTORY — DX: Orthostatic hypotension: I95.1

## 2013-01-17 HISTORY — DX: Other specified cardiac arrhythmias: I49.8

## 2013-01-17 HISTORY — DX: Tachycardia, unspecified: R00.0

## 2013-01-17 HISTORY — DX: Postural orthostatic tachycardia syndrome (POTS): G90.A

## 2013-01-17 LAB — POCT I-STAT, CHEM 8
BUN: 11 mg/dL (ref 6–23)
Calcium, Ion: 1.07 mmol/L — ABNORMAL LOW (ref 1.12–1.23)
Chloride: 114 mEq/L — ABNORMAL HIGH (ref 96–112)
Creatinine, Ser: 1 mg/dL (ref 0.50–1.10)
Glucose, Bld: 94 mg/dL (ref 70–99)
HCT: 36 % (ref 36.0–46.0)
Hemoglobin: 12.2 g/dL (ref 12.0–15.0)
Potassium: 4.7 mEq/L (ref 3.5–5.1)
Sodium: 140 mEq/L (ref 135–145)
TCO2: 16 mmol/L (ref 0–100)

## 2013-01-17 MED ORDER — SODIUM CHLORIDE 0.9 % IV BOLUS (SEPSIS)
1000.0000 mL | Freq: Once | INTRAVENOUS | Status: AC
  Administered 2013-01-17: 1000 mL via INTRAVENOUS

## 2013-01-17 MED ORDER — OXYCODONE-ACETAMINOPHEN 5-325 MG PO TABS
1.0000 | ORAL_TABLET | Freq: Once | ORAL | Status: AC
  Administered 2013-01-17: 1 via ORAL
  Filled 2013-01-17: qty 1

## 2013-01-17 MED ORDER — ONDANSETRON HCL 4 MG/2ML IJ SOLN
4.0000 mg | Freq: Once | INTRAMUSCULAR | Status: AC
  Administered 2013-01-17: 4 mg via INTRAVENOUS
  Filled 2013-01-17: qty 2

## 2013-01-17 MED ORDER — LORAZEPAM 1 MG PO TABS
1.0000 mg | ORAL_TABLET | Freq: Once | ORAL | Status: AC
  Administered 2013-01-17: 1 mg via ORAL
  Filled 2013-01-17: qty 1

## 2013-01-17 NOTE — ED Notes (Signed)
Bed:WA22<BR> Expected date:<BR> Expected time:<BR> Means of arrival:<BR> Comments:<BR> ems °

## 2013-01-17 NOTE — ED Provider Notes (Signed)
CSN: [id number]     Arrival date & time 01/17/13  1525 History     First MD Initiated Contact with Patient 01/17/13 1548     Chief Complaint  Patient presents with  . hypotension/ POTS    (Consider location/radiation/quality/duration/timing/severity/associated sxs/prior Treatment) HPI  History provided by the patient. Patient is a 42 year old female with a history of bipolar disorder, fibromyalgia, POTS presents with complaints of feeling dehydrated and near syncopal. Her sister in law was in a very bad accident yesterday and she did not get the fluids she was supposed to get for her POTS. She has a note from her physician at [facility] here with her expressing that sometimes the patient needs two liters of fluids that she has no heart problems and that her EF is greater than 55% checked only a few months ago. She feels as though this is her normal exacerbation of POTS. She denies having any chest pain or shortness of breath. She denies any headache, numbness or weakness. No other aggravating or alleviating factors. No other associated symptoms. No fever, chills or sweats. No nausea or vomiting.   Past Medical History  Diagnosis Date  . Bipolar disorder   . Fibromyalgia     on disability  . Migraine   . Panic attack   . Muscle spasm   . Uterine perforation     hx of  . Syncope   . POTS (postural orthostatic tachycardia syndrome)    Past Surgical History  Procedure Laterality Date  . Dilation and curettage of uterus     Family History  Problem Relation Age of Onset  . Diabetes Mother   . Hypertension Mother   . Cancer Father     brain tumor   History  Substance Use Topics  . Smoking status: Never Smoker   . Smokeless tobacco: Not on file  . Alcohol Use: No   OB History   Grav Para Term Preterm Abortions TAB SAB Ect Mult Living                 Review of Systems Constitutional: Negative for fever, chills and diaphoresis.  Respiratory: Negative for shortness of breath.   Cardiovascular: Negative for chest pain.  Neurological: Positive for light-headedness. Negative for syncope, weakness, numbness and headaches.  All other systems reviewed and are negative.  Allergies  Azithromycin; Depakote; Lithium; Penicillins; Seroquel; and Sulfa antibiotics  Home Medications   Current Outpatient Rx  Name  Route  Sig  Dispense  Refill  . acetaminophen (TYLENOL) 500 MG tablet   Oral   Take 1,000 mg by mouth every 6 (six) hours as needed for pain.         Marland Kitchen atenolol (TENORMIN) 25 MG tablet   Oral   Take 12.5 mg by mouth daily.         . clonazePAM (KLONOPIN) 1 MG tablet   Oral   Take 1 mg by mouth 3 (three) times daily as needed for anxiety.          . fexofenadine (ALLEGRA) 180 MG tablet   Oral   Take 180 mg by mouth daily.         Marland Kitchen gabapentin (NEURONTIN) 300 MG capsule   Oral   Take 600 mg by mouth 2 (two) times daily.         . medroxyPROGESTERone (DEPO-PROVERA) 150 MG/ML injection   Intramuscular   Inject 150 mg into the muscle every 3 (three) months.         Marland Kitchen  NALTREXONE HCL PO   Oral   Take 4.5 mg by mouth daily. Pt has medication compounded         . oxyCODONE-acetaminophen (PERCOCET/ROXICET) 5-325 MG per tablet   Oral   Take 1 tablet by mouth every 6 (six) hours as needed for pain.         . ranitidine (ZANTAC) 150 MG tablet   Oral   Take 150 mg by mouth daily.         . SUMAtriptan (IMITREX) 100 MG tablet   Oral   Take 100 mg by mouth daily as needed for migraine.         Marland Kitchen tiZANidine (ZANAFLEX) 4 MG tablet   Oral   Take 4 mg by mouth every 8 (eight) hours as needed (for muscle spasms).          . topiramate (TOPAMAX) 50 MG tablet   Oral   Take 100 mg by mouth daily.          . traZODone (DESYREL) 100 MG tablet   Oral   Take 200 mg by mouth at bedtime.          Marland Kitchen venlafaxine XR (EFFEXOR-XR) 75 MG 24 hr capsule   Oral   Take 150 mg by mouth every morning.          BP 106/61  Pulse 84   Temp(Src) 97.8 F (36.6 C) (Oral)  Resp 20  SpO2 98% Physical Exam  Nursing note and vitals reviewed. Constitutional: She appears well-developed and well-nourished. No distress.  HENT:  Head: Normocephalic and atraumatic.  Eyes: Pupils are equal, round, and reactive to light.  Neck: Normal range of motion. Neck supple.  Cardiovascular: Normal rate and regular rhythm.   Pulmonary/Chest: Effort normal.  Abdominal: Soft.  Neurological: She is alert.  Skin: Skin is warm and dry.    ED Course   Procedures (including critical care time)  Labs Reviewed  POCT I-STAT, CHEM 8 - Abnormal; Notable for the following:    Chloride 114 (*)    Calcium, Ion 1.07 (*)    All other components within normal limits   No results found. 1. POTS (postural orthostatic tachycardia syndrome)   2. Dehydration     MDM  4pm-  Pt seen and evaluated, resting comfortably and not in any distress.   6:14 pm - Patient re-evaluated. Fluids have finished After fluids, nausea and pain medication patient admits that she feels very anxious because she has so much going on. She takes psychiatric medications and does not have her anxiety medication here. Requests that I give her anxiety medication. Then to recheck on her. She does not want any  More tests done.  7:35pm - pt and husband would like to leave. She is no longer feeling anxious although she does not feel 100% better. She was offered transfer to [facility] where her provider is but she declines, she also does not want to stay here or any more work up done. Her vitals are stable and her blood work is not acute. Will d/c her.  42 y.o.Quita Skye Hildreth's evaluation in the Emergency Department is complete. It has been determined that no acute conditions requiring further emergency intervention are present at this time. The patient/guardian have been advised of the diagnosis and plan. We have discussed signs and symptoms that warrant return to the ED, such as changes or  worsening in symptoms.  Vital signs are stable at discharge. Filed Vitals:   01/17/13 1827  BP:  119/68  Pulse: 81  Temp:   Resp: 18    Patient/guardian has voiced understanding and agreed to follow-up with the PCP or specialist.          Dorthula Matas, PA-C 01/17/13 1937

## 2013-01-17 NOTE — ED Notes (Signed)
Pt's husband called out stating the pt fell. Upon entering the room, the pt was laying on the floor on several blankets that had been spread out underneath her. Her husband said he was not sure what happened and pt was not responding to verbal prompting. A light sternal rub was given and pt immediately began responding. Upon further discussion, it was discovered that the pt spread the blankets on the floor and laid down before calling out for assistance. No fall occurred.

## 2013-01-17 NOTE — ED Notes (Signed)
Pt reports hx of POTS, has note from [facility] stating pt requires aggressive hydration. See note pt has.

## 2013-01-17 NOTE — ED Notes (Signed)
Bedside report received from previous RN, Taquita. 

## 2013-01-18 NOTE — ED Provider Notes (Signed)
Medical screening examination/treatment/procedure(s) were performed by non-physician practitioner and as supervising physician I was immediately available for consultation/collaboration.  Diana Armijo, MD 01/18/13 0015 

## 2013-01-27 DIAGNOSIS — A09 Infectious gastroenteritis and colitis, unspecified: Secondary | ICD-10-CM | POA: Insufficient documentation

## 2013-02-08 ENCOUNTER — Encounter (HOSPITAL_COMMUNITY): Payer: Self-pay

## 2013-02-08 ENCOUNTER — Emergency Department (HOSPITAL_COMMUNITY)
Admission: EM | Admit: 2013-02-08 | Discharge: 2013-02-08 | Disposition: A | Discharge status: Home / Self Care | Payer: BC Managed Care – PPO | Attending: Emergency Medicine | Admitting: Emergency Medicine

## 2013-02-08 DIAGNOSIS — IMO0001 Reserved for inherently not codable concepts without codable children: Secondary | ICD-10-CM | POA: Insufficient documentation

## 2013-02-08 DIAGNOSIS — E860 Dehydration: Secondary | ICD-10-CM

## 2013-02-08 DIAGNOSIS — G43909 Migraine, unspecified, not intractable, without status migrainosus: Secondary | ICD-10-CM | POA: Insufficient documentation

## 2013-02-08 DIAGNOSIS — F410 Panic disorder [episodic paroxysmal anxiety] without agoraphobia: Secondary | ICD-10-CM | POA: Insufficient documentation

## 2013-02-08 DIAGNOSIS — Z880 Allergy status to penicillin: Secondary | ICD-10-CM | POA: Insufficient documentation

## 2013-02-08 DIAGNOSIS — Z8679 Personal history of other diseases of the circulatory system: Secondary | ICD-10-CM | POA: Insufficient documentation

## 2013-02-08 DIAGNOSIS — R5381 Other malaise: Secondary | ICD-10-CM | POA: Insufficient documentation

## 2013-02-08 DIAGNOSIS — Z8739 Personal history of other diseases of the musculoskeletal system and connective tissue: Secondary | ICD-10-CM | POA: Insufficient documentation

## 2013-02-08 DIAGNOSIS — R42 Dizziness and giddiness: Secondary | ICD-10-CM | POA: Insufficient documentation

## 2013-02-08 DIAGNOSIS — R000 Tachycardia, unspecified: Secondary | ICD-10-CM | POA: Insufficient documentation

## 2013-02-08 DIAGNOSIS — Z79899 Other long term (current) drug therapy: Secondary | ICD-10-CM | POA: Insufficient documentation

## 2013-02-08 DIAGNOSIS — R110 Nausea: Secondary | ICD-10-CM | POA: Insufficient documentation

## 2013-02-08 DIAGNOSIS — F319 Bipolar disorder, unspecified: Secondary | ICD-10-CM | POA: Insufficient documentation

## 2013-02-08 LAB — URINALYSIS, ROUTINE W REFLEX MICROSCOPIC
Glucose, UA: NEGATIVE mg/dL
Hgb urine dipstick: NEGATIVE
Protein, ur: NEGATIVE mg/dL
Urobilinogen, UA: 0.2 mg/dL (ref 0.0–1.0)

## 2013-02-08 LAB — CBC WITH DIFFERENTIAL/PLATELET
Eosinophils Absolute: 0.1 10*3/uL (ref 0.0–0.7)
Eosinophils Relative: 1 % (ref 0–5)
Hemoglobin: 13 g/dL (ref 12.0–15.0)
Lymphocytes Relative: 34 % (ref 12–46)
Lymphs Abs: 2.2 10*3/uL (ref 0.7–4.0)
MCH: 27.8 pg (ref 26.0–34.0)
MCV: 83.5 fL (ref 78.0–100.0)
Monocytes Relative: 6 % (ref 3–12)
Neutrophils Relative %: 58 % (ref 43–77)
RBC: 4.67 MIL/uL (ref 3.87–5.11)
WBC: 6.7 10*3/uL (ref 4.0–10.5)

## 2013-02-08 LAB — BASIC METABOLIC PANEL
BUN: 9 mg/dL (ref 6–23)
CO2: 18 mEq/L — ABNORMAL LOW (ref 19–32)
GFR calc non Af Amer: 66 mL/min — ABNORMAL LOW (ref >90)
Glucose, Bld: 97 mg/dL (ref 70–99)
Potassium: 3.7 mEq/L (ref 3.5–5.1)
Sodium: 138 mEq/L (ref 135–145)

## 2013-02-08 MED ORDER — PROMETHAZINE HCL 25 MG RE SUPP: 25.0000 mg | Freq: Four times a day (QID) | RECTAL | Status: DC | PRN

## 2013-02-08 MED ORDER — SODIUM CHLORIDE 0.9 % IV BOLUS (SEPSIS)
1000.0000 mL | Freq: Once | INTRAVENOUS | Status: AC
  Administered 2013-02-08: 1000 mL via INTRAVENOUS

## 2013-02-08 MED ORDER — PROMETHAZINE HCL 25 MG/ML IJ SOLN
12.5000 mg | Freq: Once | INTRAMUSCULAR | Status: AC
  Administered 2013-02-08: 12.5 mg via INTRAVENOUS
  Filled 2013-02-08: qty 1

## 2013-02-08 NOTE — ED Notes (Signed)
States that she has been dizzy since yesterday. Has a letter with her from [facility] stating that she may need intermittent hydration of 2L due to inadequate PO intake. States that she has chronic diarrhea due to IBS. States that the medication to treat POTS is causing nausea and is difficult to get fluid in.

## 2013-02-08 NOTE — ED Provider Notes (Signed)
Labs reviewed and discussed with patient.  Will add urine culture to collected specimen.  Discharge instructions reviewed with patient.  Jimmye Norman, NP 02/08/13 [phone]

## 2013-02-08 NOTE — ED Notes (Signed)
PA at bedside.

## 2013-02-08 NOTE — ED Notes (Signed)
Pt presents with NAD reports HX of POTS c/o fo dizziness with N/D for several days. Continuous nausea from meds, diarrhea from IBS, eased several days ago.  Pt here for hydration.  Pt denies CP.  HX of the same

## 2013-02-08 NOTE — ED Provider Notes (Signed)
Medical screening examination/treatment/procedure(s) were performed by non-physician practitioner and as supervising physician I was immediately available for consultation/collaboration.  Shon Baton, MD 02/08/13 [phone]

## 2013-02-08 NOTE — ED Provider Notes (Signed)
CSN: [id number]     Arrival date & time 02/08/13  0845 History     First MD Initiated Contact with Patient 02/08/13 0920     Chief Complaint  Patient presents with  . Nausea  . Diarrhea    x5 days stopped 2 days ago Hx of POTS   (Consider location/radiation/quality/duration/timing/severity/associated sxs/prior Treatment) Patient is a 42 y.o. female presenting with weakness. The history is provided by the patient. No language interpreter was used.  Weakness This is a recurrent problem. The current episode started today. Associated symptoms include myalgias, nausea and weakness. Pertinent negatives include no chest pain, chills, coughing or fever. Associated symptoms comments: She presents with dizziness, generalized weakness and feels dehydrated similar to multiple previous presentations secondary to POTS syndrome. She is currently being treated by cardiology who she reports is changing medications to treat orthostasis. No new symptoms. She denies pain, fever, hematemesis or bloody bowel movements. She has chronic diarrhea which she reports is actually doing well today. .    Past Medical History  Diagnosis Date  . Bipolar disorder   . Fibromyalgia     on disability  . Migraine   . Panic attack   . Muscle spasm   . Uterine perforation     hx of  . Syncope   . POTS (postural orthostatic tachycardia syndrome)    Past Surgical History  Procedure Laterality Date  . Dilation and curettage of uterus     Family History  Problem Relation Age of Onset  . Diabetes Mother   . Hypertension Mother   . Cancer Father     brain tumor   History  Substance Use Topics  . Smoking status: Never Smoker   . Smokeless tobacco: Not on file  . Alcohol Use: No   OB History   Grav Para Term Preterm Abortions TAB SAB Ect Mult Living                 Review of Systems  Constitutional: Negative for fever and chills.  Respiratory: Negative.  Negative for cough and shortness of breath.    Cardiovascular: Negative.  Negative for chest pain.  Gastrointestinal: Positive for nausea and diarrhea.  Genitourinary: Negative.  Negative for dysuria and flank pain.  Musculoskeletal: Positive for myalgias.  Skin: Negative.   Neurological: Positive for dizziness and weakness.  Psychiatric/Behavioral: Negative.  Negative for confusion.    Allergies  Azithromycin; Depakote; Lithium; Penicillins; Seroquel; and Sulfa antibiotics  Home Medications   Current Outpatient Rx  Name  Route  Sig  Dispense  Refill  . acetaminophen (TYLENOL) 500 MG tablet   Oral   Take 1,000 mg by mouth every 6 (six) hours as needed for pain.         Marland Kitchen atenolol (TENORMIN) 25 MG tablet   Oral   Take 12.5 mg by mouth daily.         . clonazePAM (KLONOPIN) 1 MG tablet   Oral   Take 1 mg by mouth 3 (three) times daily as needed for anxiety.          . fexofenadine (ALLEGRA) 180 MG tablet   Oral   Take 180 mg by mouth daily.         Marland Kitchen gabapentin (NEURONTIN) 300 MG capsule   Oral   Take 600 mg by mouth 2 (two) times daily.         . medroxyPROGESTERone (DEPO-PROVERA) 150 MG/ML injection   Intramuscular   Inject 150 mg into the muscle  every 3 (three) months.         Marland Kitchen NALTREXONE HCL PO   Oral   Take 4.5 mg by mouth daily. Pt has medication compounded         . oxyCODONE-acetaminophen (PERCOCET/ROXICET) 5-325 MG per tablet   Oral   Take 1 tablet by mouth every 6 (six) hours as needed for pain.         . ranitidine (ZANTAC) 150 MG tablet   Oral   Take 150 mg by mouth daily.         . SUMAtriptan (IMITREX) 100 MG tablet   Oral   Take 100 mg by mouth daily as needed for migraine.         Marland Kitchen tiZANidine (ZANAFLEX) 4 MG tablet   Oral   Take 4 mg by mouth every 8 (eight) hours as needed (for muscle spasms).          . topiramate (TOPAMAX) 50 MG tablet   Oral   Take 100 mg by mouth daily.          . traZODone (DESYREL) 100 MG tablet   Oral   Take 200 mg by mouth at  bedtime.          Marland Kitchen venlafaxine XR (EFFEXOR-XR) 75 MG 24 hr capsule   Oral   Take 150 mg by mouth every morning.          BP 102/69  Pulse 78  Temp(Src) 99 F (37.2 C) (Rectal)  Resp 16  Ht 5\' 9"  (1.753 m)  Wt 170 lb (77.111 kg)  BMI 25.09 kg/m2  SpO2 100% Physical Exam  Constitutional: She is oriented to person, place, and time. She appears well-developed and well-nourished.  HENT:  Head: Normocephalic.  Mouth/Throat: Mucous membranes are dry.  Neck: Normal range of motion. Neck supple.  Cardiovascular: Normal rate and regular rhythm.   Pulmonary/Chest: Effort normal and breath sounds normal.  Abdominal: Soft. Bowel sounds are normal. There is no tenderness. There is no rebound and no guarding.  Musculoskeletal: Normal range of motion.  Neurological: She is alert and oriented to person, place, and time.  Skin: Skin is warm and dry. No rash noted.  Psychiatric: She has a normal mood and affect.    ED Course   Procedures (including critical care time)  Labs Reviewed  URINALYSIS, ROUTINE W REFLEX MICROSCOPIC - Abnormal; Notable for the following:    APPearance CLOUDY (*)    Bilirubin Urine SMALL (*)    Leukocytes, UA SMALL (*)    All other components within normal limits  URINE MICROSCOPIC-ADD ON - Abnormal; Notable for the following:    Squamous Epithelial / LPF FEW (*)    All other components within normal limits  BASIC METABOLIC PANEL - Abnormal; Notable for the following:    CO2 18 (*)    Calcium 8.2 (*)    GFR calc non Af Amer 66 (*)    GFR calc Af Amer 77 (*)    All other components within normal limits  CBC WITH DIFFERENTIAL   No results found. No diagnosis found. 1. Dehydration  MDM  She is feeling improved with IV fluids. An additional liter is ordered during PO challenge. Anticipate discharge home.   Arnoldo Hooker, PA-C 02/08/13 1418

## 2013-02-09 LAB — URINE CULTURE
Colony Count: NO GROWTH
Culture: NO GROWTH

## 2013-02-09 NOTE — ED Provider Notes (Signed)
I was available for consultation the completion of this patient's care.   Gerhard Munch, MD 02/09/13 [phone]

## 2013-02-16 ENCOUNTER — Ambulatory Visit: Payer: BC Managed Care – PPO | Admitting: Family Medicine

## 2013-02-26 ENCOUNTER — Ambulatory Visit (INDEPENDENT_AMBULATORY_CARE_PROVIDER_SITE_OTHER): Payer: BC Managed Care – PPO | Admitting: Family Medicine

## 2013-02-26 ENCOUNTER — Encounter: Payer: Self-pay | Admitting: Family Medicine

## 2013-02-26 VITALS — BP 98/60 | HR 100 | Temp 97.9°F | Wt 173.0 lb

## 2013-02-26 DIAGNOSIS — Z309 Encounter for contraceptive management, unspecified: Secondary | ICD-10-CM

## 2013-02-26 DIAGNOSIS — M545 Low back pain, unspecified: Secondary | ICD-10-CM | POA: Insufficient documentation

## 2013-02-26 DIAGNOSIS — M797 Fibromyalgia: Secondary | ICD-10-CM

## 2013-02-26 DIAGNOSIS — G43909 Migraine, unspecified, not intractable, without status migrainosus: Secondary | ICD-10-CM

## 2013-02-26 DIAGNOSIS — Z8639 Personal history of other endocrine, nutritional and metabolic disease: Secondary | ICD-10-CM

## 2013-02-26 DIAGNOSIS — Z793 Long term (current) use of hormonal contraceptives: Secondary | ICD-10-CM

## 2013-02-26 DIAGNOSIS — Z79899 Other long term (current) drug therapy: Secondary | ICD-10-CM

## 2013-02-26 DIAGNOSIS — R000 Tachycardia, unspecified: Secondary | ICD-10-CM

## 2013-02-26 DIAGNOSIS — Z136 Encounter for screening for cardiovascular disorders: Secondary | ICD-10-CM

## 2013-02-26 DIAGNOSIS — G90A Postural orthostatic tachycardia syndrome (POTS): Secondary | ICD-10-CM | POA: Insufficient documentation

## 2013-02-26 DIAGNOSIS — IMO0001 Reserved for inherently not codable concepts without codable children: Secondary | ICD-10-CM

## 2013-02-26 DIAGNOSIS — F319 Bipolar disorder, unspecified: Secondary | ICD-10-CM

## 2013-02-26 DIAGNOSIS — Z1231 Encounter for screening mammogram for malignant neoplasm of breast: Secondary | ICD-10-CM

## 2013-02-26 DIAGNOSIS — I951 Orthostatic hypotension: Secondary | ICD-10-CM

## 2013-02-26 LAB — LDL CHOLESTEROL, DIRECT: Direct LDL: 107.8 mg/dL

## 2013-02-26 LAB — LIPID PANEL: HDL: 34.2 mg/dL — ABNORMAL LOW (ref >39.00)

## 2013-02-26 MED ORDER — DEXAMETHASONE SOD PHOSPHATE PF 10 MG/ML IJ SOLN
10.0000 mg | Freq: Once | INTRAMUSCULAR | Status: AC
  Administered 2013-02-26: 10 mg via INTRAMUSCULAR

## 2013-02-26 NOTE — Patient Instructions (Addendum)
Good to see you. We will call you with your lab results.  We will also call you with your cardiology, mammogram and bone density appointments- please ask them about 3 D mammogram.  Please make an appointment to see me in October for a follow up and Depo shot.

## 2013-02-26 NOTE — Progress Notes (Signed)
Subjective:    Patient ID: Melissa [name], female    DOB: September 27, 1970, 42 y.o.   MRN: [id number]  HPI  42 yo pt with complicated medical history here with her husband to establish care with me- was seeing Dr. Para March.  Recurrent syncope- recently diagnosed with POTs after full work up.  Seeing Dr. Pattricia Boss- Arvilla Market at [facility]. Brings in note from her today that states she needs 2 L IV NS infusion weekly and Ardyth would like to have that done locally rather than at [facility].  Per pt, Dr. Keith Rake tried to call [facility] and was told someone locally needed to order this.  Normal echo on 06/30/2012.  Low back pain- 1.5 weeks ago, was walking in store and felt "something catch."  Went to walk in clinic, given "an injection" and symptoms improved slightly but now she feels it is worse. No radiculopathy, no LE weakness.    Fibromyalgia- followed by pain management.  On Gabapentin, Naltrexone and receives lidocaine infusions.   Still has chronic pain.  Not taking any supplements.  Does report a h/o Vit D deficiency.  Migraines- have been well controlled on prophylactic topamax.  Uses Imitrex for abortive therapy.  Has been on depro provera for over 10 years for contraception.  Has OBGYN (Dr. Seymour Bars).  Has never had a mammogram.  Due for Pap smear in spring 2015.  Patient Active Problem List   Diagnosis Date Noted  . POTS (postural orthostatic tachycardia syndrome) 02/26/2013  . Low back pain 02/26/2013  . Syncope 11/29/2012  . Migraine 11/29/2012  . Contraception management 11/29/2012  . Fibromyalgia   . Bipolar disorder    Past Medical History  Diagnosis Date  . Bipolar disorder   . Fibromyalgia     on disability  . Migraine   . Panic attack   . Muscle spasm   . Uterine perforation     hx of  . Syncope   . POTS (postural orthostatic tachycardia syndrome)    Past Surgical History  Procedure Laterality Date  . Dilation and curettage of uterus     History  Substance  Use Topics  . Smoking status: Never Smoker   . Smokeless tobacco: Not on file  . Alcohol Use: No   Family History  Problem Relation Age of Onset  . Diabetes Mother   . Hypertension Mother   . Cancer Father     brain tumor   Allergies  Allergen Reactions  . Azithromycin     rash  . Depakote [Divalproex Sodium]     Hair loss  . Lithium     Rash  . Penicillins     rash  . Seroquel [Quetiapine Fumarate]     irritable  . Sulfa Antibiotics     Rash   Current Outpatient Prescriptions on File Prior to Visit  Medication Sig Dispense Refill  . acetaminophen (TYLENOL) 500 MG tablet Take 1,000 mg by mouth every 6 (six) hours as needed for pain.      Marland Kitchen atenolol (TENORMIN) 25 MG tablet Take 12.5 mg by mouth daily.      . clonazePAM (KLONOPIN) 1 MG tablet Take 1 mg by mouth 3 (three) times daily as needed for anxiety.       . fexofenadine (ALLEGRA) 180 MG tablet Take 180 mg by mouth daily.      Marland Kitchen gabapentin (NEURONTIN) 300 MG capsule Take 600 mg by mouth 2 (two) times daily.      . medroxyPROGESTERone (DEPO-PROVERA) 150 MG/ML  injection Inject 150 mg into the muscle every 3 (three) months.      Marland Kitchen NALTREXONE HCL PO Take 4.5 mg by mouth daily. Pt has medication compounded      . oxyCODONE-acetaminophen (PERCOCET/ROXICET) 5-325 MG per tablet Take 1 tablet by mouth every 6 (six) hours as needed for pain.      . promethazine (PHENERGAN) 25 MG suppository Place 1 suppository (25 mg total) rectally every 6 (six) hours as needed for nausea.  12 each  0  . ranitidine (ZANTAC) 150 MG tablet Take 150 mg by mouth daily.      . SUMAtriptan (IMITREX) 100 MG tablet Take 100 mg by mouth daily as needed for migraine.      Marland Kitchen tiZANidine (ZANAFLEX) 4 MG tablet Take 4 mg by mouth every 8 (eight) hours as needed (for muscle spasms).       . topiramate (TOPAMAX) 50 MG tablet Take 100 mg by mouth daily.       . traZODone (DESYREL) 100 MG tablet Take 200 mg by mouth at bedtime.       Marland Kitchen venlafaxine XR (EFFEXOR-XR)  75 MG 24 hr capsule Take 150 mg by mouth every morning.       No current facility-administered medications on file prior to visit.   The PMH, PSH, Social History, Family History, Medications, and allergies have been reviewed in [facility], and have been updated if relevant.     Review of Systems See HPI No CP or SOB +peristent dizziness +shoulder, arm, leg pain    Objective:   Physical Exam BP 98/60  Pulse 100  Temp(Src) 97.9 F (36.6 C)  Wt 173 lb (78.472 kg)  BMI 25.54 kg/m2  General:  Well-developed,well-nourished,in no acute distress; alert,appropriate and cooperative throughout examination Head:  normocephalic and atraumatic.   Eyes:  vision grossly intact, pupils equal, pupils round, and pupils reactive to light.   Ears:  R ear normal and L ear normal.   Nose:  no external deformity.   Mouth:  good dentition.   Neck:  No deformities, masses, or tenderness noted. Lungs:  Normal respiratory effort, chest expands symmetrically. Lungs are clear to auscultation, no crackles or wheezes. Heart:  Normal rate and regular rhythm. S1 and S2 normal without gallop, murmur, click, rub or other extra sounds. Abdomen:  Bowel sounds positive,abdomen soft and non-tender without masses, organomegaly or hernias noted. Msk:  No deformity or scoliosis noted of thoracic or lumbar spine.  No TTP over spine, SLR neg bilaterally. Extremities:  No clubbing, cyanosis, edema, or deformity noted with normal full range of motion of all joints.   Neurologic:  alert & oriented X3 and gait normal.   Skin:  Intact without suspicious lesions or rashes Cervical Nodes:  No lymphadenopathy noted Axillary Nodes:  No palpable lymphadenopathy Psych:  Cognition and judgment appear intact. Alert and cooperative with normal attention span and concentration. No apparent delusions, illusions, hallucinations     Assessment & Plan:  1. POTS (postural orthostatic tachycardia syndrome) I cannot order infusions at a cancer  center as I am not treating her for POTs.  I did explain this to her.  Will refer her to local cardiologist but I did advise she continue seeing her specialist at [facility]. The patient indicates understanding of these issues and agrees with the plan.  - Ambulatory referral to Cardiology  2. Fibromyalgia Managed by pain management.  I did discuss supplements with her- given Dr. Fatima Sanger fibromyalgia handout. Will also check Vit D today.  4. Migraine Well controlled on prophylaxis.  5. Contraception management On Depo provera long term- DEXA ordered.  See below.  6. Long term current use of hormonal contraceptive  - DG Bone Density; Future  7. History of vitamin D deficiency  - Vitamin D, 25-hydroxy  8. Screening for ischemic heart disease  - Lipid Panel  9. Other screening mammogram  - MM Digital Screening; Future  10. Low back pain No red flag symptoms, imaging not necessary at this time.  IM decadron given today. - dexamethasone Sod Phosphate PF SOLN 10 mg; Inject 1 mL (10 mg total) as directed once.

## 2013-02-27 LAB — VITAMIN D 25 HYDROXY (VIT D DEFICIENCY, FRACTURES): Vit D, 25-Hydroxy: 18 ng/mL — ABNORMAL LOW (ref 30–89)

## 2013-02-28 MED ORDER — VITAMIN D (ERGOCALCIFEROL) 1.25 MG (50000 UNIT) PO CAPS: ORAL_CAPSULE | ORAL | Status: DC

## 2013-02-28 NOTE — Addendum Note (Signed)
Addended by: Eliezer Bottom on: 02/28/2013 04:19 PM   Modules accepted: Orders

## 2013-03-01 ENCOUNTER — Ambulatory Visit: Payer: Self-pay | Admitting: Family Medicine

## 2013-03-02 ENCOUNTER — Emergency Department (HOSPITAL_COMMUNITY): Payer: BC Managed Care – PPO

## 2013-03-02 ENCOUNTER — Encounter: Payer: Self-pay | Admitting: Family Medicine

## 2013-03-02 ENCOUNTER — Emergency Department (HOSPITAL_COMMUNITY)
Admission: EM | Admit: 2013-03-02 | Discharge: 2013-03-02 | Disposition: A | Discharge status: Home / Self Care | Payer: BC Managed Care – PPO | Attending: Emergency Medicine | Admitting: Emergency Medicine

## 2013-03-02 ENCOUNTER — Encounter (HOSPITAL_COMMUNITY): Payer: Self-pay | Admitting: Emergency Medicine

## 2013-03-02 DIAGNOSIS — G90A Postural orthostatic tachycardia syndrome (POTS): Secondary | ICD-10-CM

## 2013-03-02 DIAGNOSIS — R42 Dizziness and giddiness: Secondary | ICD-10-CM | POA: Insufficient documentation

## 2013-03-02 DIAGNOSIS — F411 Generalized anxiety disorder: Secondary | ICD-10-CM | POA: Insufficient documentation

## 2013-03-02 DIAGNOSIS — M542 Cervicalgia: Secondary | ICD-10-CM | POA: Insufficient documentation

## 2013-03-02 DIAGNOSIS — G43909 Migraine, unspecified, not intractable, without status migrainosus: Secondary | ICD-10-CM | POA: Insufficient documentation

## 2013-03-02 DIAGNOSIS — Z87448 Personal history of other diseases of urinary system: Secondary | ICD-10-CM | POA: Insufficient documentation

## 2013-03-02 DIAGNOSIS — R61 Generalized hyperhidrosis: Secondary | ICD-10-CM | POA: Insufficient documentation

## 2013-03-02 DIAGNOSIS — I498 Other specified cardiac arrhythmias: Secondary | ICD-10-CM | POA: Insufficient documentation

## 2013-03-02 DIAGNOSIS — Z880 Allergy status to penicillin: Secondary | ICD-10-CM | POA: Insufficient documentation

## 2013-03-02 DIAGNOSIS — R0602 Shortness of breath: Secondary | ICD-10-CM | POA: Insufficient documentation

## 2013-03-02 DIAGNOSIS — G8929 Other chronic pain: Secondary | ICD-10-CM | POA: Insufficient documentation

## 2013-03-02 DIAGNOSIS — R197 Diarrhea, unspecified: Secondary | ICD-10-CM | POA: Insufficient documentation

## 2013-03-02 DIAGNOSIS — R071 Chest pain on breathing: Secondary | ICD-10-CM | POA: Insufficient documentation

## 2013-03-02 DIAGNOSIS — R110 Nausea: Secondary | ICD-10-CM | POA: Insufficient documentation

## 2013-03-02 DIAGNOSIS — M797 Fibromyalgia: Secondary | ICD-10-CM

## 2013-03-02 DIAGNOSIS — R0789 Other chest pain: Secondary | ICD-10-CM

## 2013-03-02 DIAGNOSIS — Z79899 Other long term (current) drug therapy: Secondary | ICD-10-CM | POA: Insufficient documentation

## 2013-03-02 DIAGNOSIS — M549 Dorsalgia, unspecified: Secondary | ICD-10-CM | POA: Insufficient documentation

## 2013-03-02 DIAGNOSIS — F319 Bipolar disorder, unspecified: Secondary | ICD-10-CM | POA: Insufficient documentation

## 2013-03-02 DIAGNOSIS — IMO0001 Reserved for inherently not codable concepts without codable children: Secondary | ICD-10-CM | POA: Insufficient documentation

## 2013-03-02 LAB — CBC
HCT: 42.5 % (ref 36.0–46.0)
Hemoglobin: 14.2 g/dL (ref 12.0–15.0)
MCV: 83.3 fL (ref 78.0–100.0)
RBC: 5.1 MIL/uL (ref 3.87–5.11)
RDW: 14.2 % (ref 11.5–15.5)
WBC: 12.1 10*3/uL — ABNORMAL HIGH (ref 4.0–10.5)

## 2013-03-02 LAB — BASIC METABOLIC PANEL
BUN: 8 mg/dL (ref 6–23)
CO2: 19 mEq/L (ref 19–32)
Chloride: 110 mEq/L (ref 96–112)
Creatinine, Ser: 0.92 mg/dL (ref 0.50–1.10)
GFR calc Af Amer: 87 mL/min — ABNORMAL LOW (ref >90)
Glucose, Bld: 103 mg/dL — ABNORMAL HIGH (ref 70–99)
Potassium: 3.6 mEq/L (ref 3.5–5.1)

## 2013-03-02 LAB — URINALYSIS, ROUTINE W REFLEX MICROSCOPIC
Hgb urine dipstick: NEGATIVE
Nitrite: NEGATIVE
Protein, ur: NEGATIVE mg/dL
Specific Gravity, Urine: 1.017 (ref 1.005–1.030)
Urobilinogen, UA: 0.2 mg/dL (ref 0.0–1.0)

## 2013-03-02 LAB — POCT I-STAT TROPONIN I: Troponin i, poc: 0 ng/mL (ref 0.00–0.08)

## 2013-03-02 LAB — URINE MICROSCOPIC-ADD ON

## 2013-03-02 MED ORDER — SODIUM CHLORIDE 0.9 % IV BOLUS (SEPSIS): 500.0000 mL | Freq: Once | INTRAVENOUS | Status: DC

## 2013-03-02 MED ORDER — MORPHINE SULFATE 4 MG/ML IJ SOLN
4.0000 mg | Freq: Once | INTRAMUSCULAR | Status: AC
  Administered 2013-03-02: 4 mg via INTRAVENOUS
  Filled 2013-03-02: qty 1

## 2013-03-02 MED ORDER — ONDANSETRON HCL 4 MG/2ML IJ SOLN
4.0000 mg | Freq: Once | INTRAMUSCULAR | Status: AC
  Administered 2013-03-02: 4 mg via INTRAVENOUS
  Filled 2013-03-02: qty 2

## 2013-03-02 MED ORDER — SODIUM CHLORIDE 0.9 % IV BOLUS (SEPSIS)
1000.0000 mL | Freq: Once | INTRAVENOUS | Status: AC
  Administered 2013-03-02: 1000 mL via INTRAVENOUS

## 2013-03-02 MED ORDER — HYDROMORPHONE HCL PF 1 MG/ML IJ SOLN
1.0000 mg | Freq: Once | INTRAMUSCULAR | Status: AC
  Administered 2013-03-02: 1 mg via INTRAVENOUS
  Filled 2013-03-02: qty 1

## 2013-03-02 NOTE — ED Notes (Signed)
Off floor for testing 

## 2013-03-02 NOTE — ED Provider Notes (Signed)
CSN: [id number]     Arrival date & time 03/02/13  1645 History   First MD Initiated Contact with Patient 03/02/13 1659     Chief Complaint  Patient presents with  . Chest Pain   (Consider location/radiation/quality/duration/timing/severity/associated sxs/prior Treatment) The history is provided by the patient and medical records. No language interpreter was used.    Melissa [name] is a 42 y.o. female  with a hx of bipolar disorder, fibromyalgia, POTS presents to the Emergency Department complaining of gradual, persistent, progressively worsening general illness 2/2 fibromyalgia.  Pt began to have epigastric and substernal chest pain beginning at 12:00pm today.  Pt reports she thought it was anxiety and took clonazepam and a nap without relief. Associated symptoms include nausea, SOB, left sided neck muscle tightness and pain.  Pt reports chest pain is non radiating, chest tightness, dull pain, rated at an 8/10.  Pt also reports that she has associated diaphoresis, dizziness, diarrhea, frontal headache, nausea and back pain all of which are present at baseline.  Nothing makes the chest pain better or worse.  Pt denies fever, chills, dysuria, heamturia.  Pt reports normal stress test and echo in 2013.  She is seeing Dr. Pattricia Boss- Arvilla Market at [facility] for management of her POTS.       Past Medical History  Diagnosis Date  . Bipolar disorder   . Fibromyalgia     on disability  . Migraine   . Panic attack   . Muscle spasm   . Uterine perforation     hx of  . Syncope   . POTS (postural orthostatic tachycardia syndrome)    Past Surgical History  Procedure Laterality Date  . Dilation and curettage of uterus     Family History  Problem Relation Age of Onset  . Diabetes Mother   . Hypertension Mother   . Cancer Father     brain tumor   History  Substance Use Topics  . Smoking status: Never Smoker   . Smokeless tobacco: Not on file  . Alcohol Use: No   OB History   Grav Para Term Preterm Abortions TAB SAB Ect Mult Living                 Review of Systems  Constitutional: Positive for diaphoresis (chronic). Negative for fever, appetite change, fatigue and unexpected weight change.  HENT: Positive for neck pain. Negative for facial swelling, mouth sores and neck stiffness.   Eyes: Negative for visual disturbance.  Respiratory: Positive for chest tightness and shortness of breath. Negative for cough and wheezing.   Cardiovascular: Positive for chest pain.  Gastrointestinal: Positive for nausea (chronic) and diarrhea (chronic). Negative for vomiting, abdominal pain and constipation.  Endocrine: Negative for polydipsia, polyphagia and polyuria.  Genitourinary: Negative for dysuria, urgency, frequency and hematuria.  Musculoskeletal: Positive for myalgias (chronic) and back pain ( chronic).  Skin: Negative for rash.  Allergic/Immunologic: Negative for immunocompromised state.  Neurological: Positive for dizziness (chronic) and headaches ( chronic). Negative for syncope and light-headedness.  Hematological: Does not bruise/bleed easily.  Psychiatric/Behavioral: Negative for sleep disturbance. The patient is nervous/anxious.     Allergies  Azithromycin; Depakote; Lithium; Penicillins; Seroquel; and Sulfa antibiotics  Home Medications   Current Outpatient Rx  Name  Route  Sig  Dispense  Refill  . acetaminophen (TYLENOL) 500 MG tablet   Oral   Take 1,000 mg by mouth every 6 (six) hours as needed for pain.         Marland Kitchen  atenolol (TENORMIN) 25 MG tablet   Oral   Take 12.5 mg by mouth daily.         . clonazePAM (KLONOPIN) 1 MG tablet   Oral   Take 1 mg by mouth 3 (three) times daily as needed for anxiety.          . fexofenadine (ALLEGRA) 180 MG tablet   Oral   Take 180 mg by mouth daily.         Marland Kitchen gabapentin (NEURONTIN) 300 MG capsule   Oral   Take 600 mg by mouth 2 (two) times daily.         . medroxyPROGESTERone (DEPO-PROVERA) 150  MG/ML injection   Intramuscular   Inject 150 mg into the muscle every 3 (three) months.         Marland Kitchen NALTREXONE HCL PO   Oral   Take 4.5 mg by mouth daily. Pt has medication compounded         . oxyCODONE-acetaminophen (PERCOCET/ROXICET) 5-325 MG per tablet   Oral   Take 1 tablet by mouth every 6 (six) hours as needed for pain.         . ranitidine (ZANTAC) 150 MG tablet   Oral   Take 150 mg by mouth daily.         . SUMAtriptan (IMITREX) 100 MG tablet   Oral   Take 100 mg by mouth daily as needed for migraine.         Marland Kitchen tiZANidine (ZANAFLEX) 4 MG tablet   Oral   Take 4 mg by mouth every 8 (eight) hours as needed (for muscle spasms).          . topiramate (TOPAMAX) 50 MG tablet   Oral   Take 100 mg by mouth daily.          . traZODone (DESYREL) 100 MG tablet   Oral   Take 200 mg by mouth at bedtime.          Marland Kitchen venlafaxine XR (EFFEXOR-XR) 75 MG 24 hr capsule   Oral   Take 150 mg by mouth every morning.         . Vitamin D, Ergocalciferol, (DRISDOL) 50000 UNITS CAPS capsule      Take one by mouth once a week x 6 weeks.   6 capsule   0    BP 117/61  Pulse 96  Temp(Src) 98.3 F (36.8 C) (Oral)  Resp 16  SpO2 95% Physical Exam  Nursing note and vitals reviewed. Constitutional: She is oriented to person, place, and time. She appears well-developed and well-nourished. No distress.  Awake, alert, nontoxic appearance  HENT:  Head: Normocephalic and atraumatic.  Mouth/Throat: Oropharynx is clear and moist. No oropharyngeal exudate.  Eyes: Conjunctivae are normal. Pupils are equal, round, and reactive to light. No scleral icterus.  Neck: Normal range of motion. Neck supple.  Cardiovascular: Regular rhythm, normal heart sounds and intact distal pulses.   No murmur heard. Tachycardia  Pulmonary/Chest: Effort normal and breath sounds normal. No respiratory distress. She has no wheezes. She has no rales. She exhibits tenderness.    Mild anterior chest  tenderness to palpation   Abdominal: Soft. Bowel sounds are normal. She exhibits no distension and no mass. There is no tenderness. There is no rebound and no guarding.  Musculoskeletal: Normal range of motion. She exhibits no edema.  Lymphadenopathy:    She has no cervical adenopathy.  Neurological: She is alert and oriented to person, place, and time. She  exhibits normal muscle tone. Coordination normal.  Speech is clear and goal oriented Moves extremities without ataxia  Skin: Skin is warm and dry. She is not diaphoretic. No erythema.  Psychiatric: She has a normal mood and affect.    ED Course  Procedures (including critical care time) Labs Review Labs Reviewed  CBC - Abnormal; Notable for the following:    WBC 12.1 (*)    All other components within normal limits  BASIC METABOLIC PANEL - Abnormal; Notable for the following:    Glucose, Bld 103 (*)    GFR calc non Af Amer 75 (*)    GFR calc Af Amer 87 (*)    All other components within normal limits  URINALYSIS, ROUTINE W REFLEX MICROSCOPIC - Abnormal; Notable for the following:    Leukocytes, UA MODERATE (*)    All other components within normal limits  URINE MICROSCOPIC-ADD ON  POCT I-STAT TROPONIN I  POCT I-STAT TROPONIN I   Imaging Review Dg Chest 2 View  03/02/2013   CLINICAL DATA:  Chest pain, shortness of breath  EXAM: CHEST  2 VIEW  COMPARISON:  None.  FINDINGS: The heart size and mediastinal contours are within normal limits. Both lungs are clear. The visualized skeletal structures are unremarkable.  IMPRESSION: No active cardiopulmonary disease.   Electronically Signed   By: Natasha Mead   On: 03/02/2013 18:10    ECG:  Date: 03/02/2013  Rate: 121  Rhythm: sinus tachycardia  QRS Axis: normal  Intervals: normal  ST/T Wave abnormalities: normal  Conduction Disutrbances:none  Narrative Interpretation: inverted p waves V1-V2 (L atrial abnormality); nonischemic ECG, unchanged from previous on 12/09/12  Old EKG  Reviewed: unchanged    MDM   1. Chest wall pain   2. POTS (postural orthostatic tachycardia syndrome)   3. Fibromyalgia      Volney Presser presents with CP and now resolved SOB.  Pt without hypoxia.  Tachycardic on exam; pt reports this is baseline for her.  Pt also with anterior chest tenderness on exam.  Pt with hx of fibromyalgia and she reports that at times this makes her chest hurt.  She reports more stress than normal at home in the past few weeks.  Will treat pain, give fluids and proceed with cardiac work-up.  Record review shows that pt was seen recently with c/o feeling dehydrated and near syncopal with a note from her physician at [facility] expressing that sometimes the patient needs two liters of fluids and that she has no heart problems with an EF of greater than 55% checked only a few months ago.    Patient is to be discharged with recommendation to follow up with PCP in regards to today's hospital visit. Chest pain is not likely of cardiac or pulmonary etiology d/t presentation, perc negative, VSS, no tracheal deviation, no JVD or new murmur, RRR, breath sounds equal bilaterally, EKG without acute abnormalities, negative troponin x2, and negative CXR. Pt is no longer tachycardic and chest pain is controlled here in the ED.  patient remains alert, nontoxic, nonseptic appearing. Her vital signs are stable. Chest pain remains reproducible.    Pt has been advised to return to the ED if CP becomes exertional, associated with diaphoresis or nausea, radiates to left jaw/arm, worsens or becomes concerning in any way. Pt appears reliable for follow up and is agreeable to discharge.   Case has been discussed with Dr. Pricilla Loveless who agrees with the above plan to discharge.  Dahlia Client Haydan Wedig, PA-C 03/02/13 2111

## 2013-03-02 NOTE — ED Notes (Signed)
Pt reports 8/10 centralized chest pain with radiation to back and neck. Pt reports shortness of breath, dizziness, lightheadedness, nausea, back pain, and weakness, and elevated cholesterol.

## 2013-03-03 NOTE — ED Provider Notes (Signed)
Medical screening examination/treatment/procedure(s) were performed by non-physician practitioner and as supervising physician I was immediately available for consultation/collaboration.   Audree Camel, MD 03/03/13 0002

## 2013-03-07 DIAGNOSIS — R079 Chest pain, unspecified: Secondary | ICD-10-CM | POA: Insufficient documentation

## 2013-03-08 ENCOUNTER — Encounter: Payer: Self-pay | Admitting: Family Medicine

## 2013-03-08 ENCOUNTER — Ambulatory Visit: Payer: Self-pay | Admitting: Family Medicine

## 2013-03-12 ENCOUNTER — Ambulatory Visit: Payer: BC Managed Care – PPO | Admitting: Cardiovascular Disease

## 2013-03-16 ENCOUNTER — Other Ambulatory Visit: Payer: Self-pay

## 2013-03-16 MED ORDER — OXYCODONE-ACETAMINOPHEN 5-325 MG PO TABS: 1.0000 | ORAL_TABLET | Freq: Four times a day (QID) | ORAL | Status: DC | PRN

## 2013-03-16 NOTE — Telephone Encounter (Signed)
Pt left v/m requesting rx # 60 of oxycodone apap. Call when ready for pick up.

## 2013-03-19 ENCOUNTER — Encounter: Payer: Self-pay | Admitting: Internal Medicine

## 2013-03-19 ENCOUNTER — Ambulatory Visit (INDEPENDENT_AMBULATORY_CARE_PROVIDER_SITE_OTHER): Payer: BC Managed Care – PPO | Admitting: Internal Medicine

## 2013-03-19 VITALS — BP 118/68 | HR 72 | Ht 69.0 in

## 2013-03-19 DIAGNOSIS — R000 Tachycardia, unspecified: Secondary | ICD-10-CM

## 2013-03-19 DIAGNOSIS — R55 Syncope and collapse: Secondary | ICD-10-CM

## 2013-03-19 DIAGNOSIS — G90A Postural orthostatic tachycardia syndrome (POTS): Secondary | ICD-10-CM

## 2013-03-19 DIAGNOSIS — I951 Orthostatic hypotension: Secondary | ICD-10-CM

## 2013-03-19 MED ORDER — OXYCODONE-ACETAMINOPHEN 5-325 MG PO TABS: 1.0000 | ORAL_TABLET | Freq: Four times a day (QID) | ORAL | Status: DC | PRN

## 2013-03-19 MED ORDER — SODIUM CHLORIDE 0.9 % IV BOLUS (SEPSIS)
1500.0000 mL | Freq: Once | INTRAVENOUS | Status: AC
  Administered 2013-03-19 (×2): 1500 mL via INTRAVENOUS

## 2013-03-19 MED ORDER — SODIUM CHLORIDE 0.9 % IV SOLN: 1500.0000 mL | Freq: Once | INTRAVENOUS | Status: DC

## 2013-03-19 MED ORDER — SODIUM CHLORIDE 0.9 % IV SOLN: 1000.0000 mL | Freq: Once | INTRAVENOUS | Status: DC

## 2013-03-19 MED ORDER — SODIUM CHLORIDE 0.9 % IV BOLUS (SEPSIS): 500.0000 mL | Freq: Once | INTRAVENOUS | Status: DC

## 2013-03-19 NOTE — Addendum Note (Signed)
Addended by: Antony Odea on: 03/19/2013 06:04 PM   Modules accepted: Orders

## 2013-03-19 NOTE — Progress Notes (Signed)
ELECTROPHYSIOLOGY CONSULT NOTE  Patient ID: Melissa [name], MRN: [id number], DOB/AGE: 1970-11-13 42 y.o. Admit date: (Not on file) Date of Consult: 03/19/2013  Primary Physician: Ruthe Mannan, MD Primary Cardiologist: none  Chief Complaint:  POTS   HPI Melissa [name] is a 42 y.o. female with a complex history of bipolar disease, fibromyalgia, recurrent syncope and an unusual form of POTS which was associated with post tachycardic vasodilatation. She is followed at [facility].  She also has a history of recurrent chest pains and was recently evaluated at [facility] where was felt to be noncardiac. Stress testing was recommended to help confirm this diagnosis; the patient thought that based on the very unlikely scenario as suggested by the cardiologist that it wasn't necessary to pursue.  She is here today as her PCP would like her to see if we could arrange for local infusions to have "a cardiologist" available. Past Medical History  Diagnosis Date  . Bipolar disorder   . Fibromyalgia     on disability  . Migraine   . Panic attack   . Muscle spasm   . Uterine perforation     hx of  . Syncope   . POTS (postural orthostatic tachycardia syndrome)       Surgical History:  Past Surgical History  Procedure Laterality Date  . Dilation and curettage of uterus       Home Meds: Prior to Admission medications   Medication Sig Start Date End Date Taking? Authorizing Provider  acetaminophen (TYLENOL) 500 MG tablet Take 1,000 mg by mouth every 6 (six) hours as needed for pain.   Yes Historical Provider, MD  atenolol (TENORMIN) 25 MG tablet Take 12.5 mg by mouth daily.   Yes Historical Provider, MD  clonazePAM (KLONOPIN) 1 MG tablet Take 1 mg by mouth 3 (three) times daily as needed for anxiety.    Yes Historical Provider, MD  fexofenadine (ALLEGRA) 180 MG tablet Take 180 mg by mouth daily.   Yes Historical Provider, MD  gabapentin (NEURONTIN) 300 MG capsule Take 600 mg by mouth 2 (two) times daily.    Yes Historical Provider, MD  medroxyPROGESTERone (DEPO-PROVERA) 150 MG/ML injection Inject 150 mg into the muscle every 3 (three) months.   Yes Historical Provider, MD  NALTREXONE HCL PO Take 4.5 mg by mouth daily. Pt has medication compounded   Yes Historical Provider, MD  oxyCODONE-acetaminophen (PERCOCET/ROXICET) 5-325 MG per tablet Take 1 tablet by mouth every 6 (six) hours as needed for pain. 03/19/13  Yes Dianne Dun, MD  ranitidine (ZANTAC) 150 MG tablet Take 150 mg by mouth daily.   Yes Historical Provider, MD  SUMAtriptan (IMITREX) 100 MG tablet Take 100 mg by mouth daily as needed for migraine.   Yes Historical Provider, MD  tiZANidine (ZANAFLEX) 4 MG tablet Take 4 mg by mouth every 8 (eight) hours as needed (for muscle spasms).    Yes Historical Provider, MD  topiramate (TOPAMAX) 50 MG tablet Take 100 mg by mouth daily.    Yes Historical Provider, MD  traZODone (DESYREL) 100 MG tablet Take 200 mg by mouth at bedtime.    Yes Historical Provider, MD  venlafaxine XR (EFFEXOR-XR) 75 MG 24 hr capsule Take 150 mg by mouth every morning.   Yes Historical Provider, MD  Vitamin D, Ergocalciferol, (DRISDOL) 50000 UNITS CAPS capsule Take one by mouth once a week x 6 weeks. 02/28/13  Yes Dianne Dun, MD    Inpatient Medications:  . sodium chloride  1,500 mL  Intravenous Once     Allergies:  Allergies  Allergen Reactions  . Azithromycin     rash  . Depakote [Divalproex Sodium]     Hair loss  . Lithium     Rash  . Penicillins     rash  . Seroquel [Quetiapine Fumarate]     irritable  . Sulfa Antibiotics     Rash    History   Social History  . Marital Status: Married    Spouse Name: N/A    Number of Children: N/A  . Years of Education: N/A   Occupational History  . Not on file.   Social History Main Topics  . Smoking status: Never Smoker   . Smokeless tobacco: Not on file  . Alcohol Use: No  . Drug Use: No  . Sexual Activity: Not on file   Other Topics Concern  . Not on  file   Social History Narrative   Married 20+ years   From [place]   On disability for fibromyalgia     Family History  Problem Relation Age of Onset  . Diabetes Mother   . Hypertension Mother   . Cancer Father     brain tumor     ROS:  Please see the history of present illness.     All other systems reviewed and negative.    Physical Exam:   Blood pressure 100/60, pulse 102, height 5\' 9"  (1.753 m), SpO2 99.00%. General: Well developed, well nourished female in no acute distress. Head: Normocephalic, atraumatic, sclera non-icteric, no xanthomas, nares are without discharge. EENT: normal Lymph Nodes:  none Back: without scoliosis/kyphosis, no CVA tendersness Neck: Negative for carotid bruits. JVD not elevated. Lungs: Clear bilaterally to auscultation without wheezes, rales, or rhonchi. Breathing is unlabored. Heart: RRR with S1 S2. No  murmur , rubs, or gallops appreciated. Abdomen: Soft, non-tender, non-distended with normoactive bowel sounds. No hepatomegaly. No rebound/guarding. No obvious abdominal masses. Msk:  Strength and tone appear normal for age. Extremities: No clubbing or cyanosis. No  edema.  Distal pedal pulses are 2+ and equal bilaterally. Skin: Warm and Dry Neuro: Alert and oriented X 3. CN III-XII intact Grossly normal sensory and motor function . Psych:  Responds to questions appropriately with a normal affect.      Labs: Cardiac Enzymes No results found for this basename: CKTOTAL, CKMB, TROPONINI,  in the last 72 hours CBC Lab Results  Component Value Date   WBC 12.1* 03/02/2013   HGB 14.2 03/02/2013   HCT 42.5 03/02/2013   MCV 83.3 03/02/2013   PLT 239 03/02/2013   PROTIME: No results found for this basename: LABPROT, INR,  in the last 72 hours Chemistry No results found for this basename: NA, K, CL, CO2, BUN, CREATININE, CALCIUM, LABALBU, PROT, BILITOT, ALKPHOS, ALT, AST, GLUCOSE,  in the last 168 hours Lipids Lab Results  Component Value Date    CHOL 245* 02/26/2013   HDL 34.20* 02/26/2013   TRIG 742.0* 02/26/2013   BNP No results found for this basename: probnp   Miscellaneous No results found for this basename: DDIMER    Radiology/Studies:  Dg Chest 2 View  03/02/2013   CLINICAL DATA:  Chest pain, shortness of breath  EXAM: CHEST  2 VIEW  COMPARISON:  None.  FINDINGS: The heart size and mediastinal contours are within normal limits. Both lungs are clear. The visualized skeletal structures are unremarkable.  IMPRESSION: No active cardiopulmonary disease.   Electronically Signed   By: Natasha Mead  On: 03/02/2013 18:10    EKG: nsr with out acute cahnges 8/14   Assessment and Plan:   Dyautonomia The patient has a complex dysautonomic syndrome as noted above. She is under the care of Dr. Lollie Sails. The physiology is thought to be POTS followed by vasodilatation and has been managed most effectively by volume repletion. There've been some concerns about the use of beta blockers. I discussed with Dr. Lollie Sails the role of Mestinon and ProAmatine. At this point she would like to increase her Florinef from 0.1--0.2 mg twice daily. She will arrange for followup of potassium next week.  We will work on trying to arrange 3 L saline infusions every week. It is not anticipated that there'll be a central line.  We'll see her again at the request of Dr. Webb Laws

## 2013-03-19 NOTE — Addendum Note (Signed)
Addended by: Dianne Dun on: 03/19/2013 12:14 PM   Modules accepted: Orders

## 2013-03-19 NOTE — Telephone Encounter (Signed)
Patient notified that script is up front and ready for pickup. 

## 2013-03-19 NOTE — Patient Instructions (Addendum)
Your physician recommends that you schedule a follow-up appointment in: WITH [facility] (DR. Read Drivers)

## 2013-03-19 NOTE — Telephone Encounter (Signed)
Script did not apparently print.

## 2013-03-28 ENCOUNTER — Ambulatory Visit (INDEPENDENT_AMBULATORY_CARE_PROVIDER_SITE_OTHER): Payer: Medicare Other | Admitting: Internal Medicine

## 2013-03-28 ENCOUNTER — Encounter: Payer: Self-pay | Admitting: Internal Medicine

## 2013-03-28 VITALS — BP 90/60 | HR 112 | Temp 98.2°F | Resp 20 | Ht 69.0 in | Wt 169.5 lb

## 2013-03-28 DIAGNOSIS — R197 Diarrhea, unspecified: Secondary | ICD-10-CM

## 2013-03-28 DIAGNOSIS — R110 Nausea: Secondary | ICD-10-CM

## 2013-03-28 MED ORDER — ONDANSETRON 4 MG PO TBDP: 4.0000 mg | ORAL_TABLET | Freq: Three times a day (TID) | ORAL | Status: DC | PRN

## 2013-03-28 NOTE — Progress Notes (Signed)
Subjective:    Patient ID: Melissa [name], female    DOB: [birth year], 42 y.o.   MRN: [id number]  HPI  Pt presents to the clinic today with c/o nausea, vomiting and diarrhea. This started on Friday. It came on all of a sudden. She did not eat anything that would have caused this. She has not traveled, or drank contaminated water. She reports that anytime she eats/drinks anything it goes right through her. She is having multiple loose stools per day (5-7). She denies blood in her stool. She has not vomited in the last 24 hours and has not had diarrhea today. She has taken some left over phenergan tablets as well as Imodium. She is not sure this is helping. She does have a history of IBS with diarrhea but she reports it has never been this bad. She has not had abx in the last 4 weeks. She does reports she has felt like she is running a fever with chills and sweats but has not taken her temperature. She denies contact with pt with similar symptoms.  Review of Systems      Past Medical History  Diagnosis Date  . Bipolar disorder   . Fibromyalgia     on disability  . Migraine   . Panic attack   . Muscle spasm   . Uterine perforation     hx of  . Syncope   . POTS (postural orthostatic tachycardia syndrome)     Current Outpatient Prescriptions  Medication Sig Dispense Refill  . acetaminophen (TYLENOL) 500 MG tablet Take 1,000 mg by mouth every 6 (six) hours as needed for pain.      Marland Kitchen atenolol (TENORMIN) 25 MG tablet Take 12.5 mg by mouth daily.      . clonazePAM (KLONOPIN) 1 MG tablet Take 1 mg by mouth 3 (three) times daily as needed for anxiety.       . fexofenadine (ALLEGRA) 180 MG tablet Take 180 mg by mouth daily.      Marland Kitchen gabapentin (NEURONTIN) 300 MG capsule Take 600 mg by mouth 2 (two) times daily.      . medroxyPROGESTERone (DEPO-PROVERA) 150 MG/ML injection Inject 150 mg into the muscle every 3 (three) months.      Marland Kitchen NALTREXONE HCL PO Take 4.5 mg by mouth daily. Pt has medication  compounded      . oxyCODONE-acetaminophen (PERCOCET/ROXICET) 5-325 MG per tablet Take 1 tablet by mouth every 6 (six) hours as needed for pain.  60 tablet  0  . ranitidine (ZANTAC) 150 MG tablet Take 150 mg by mouth daily.      . sodium chloride 0.9 % Inject 500 mLs into the vein once.  125 mL  0  . SUMAtriptan (IMITREX) 100 MG tablet Take 100 mg by mouth daily as needed for migraine.      Marland Kitchen tiZANidine (ZANAFLEX) 4 MG tablet Take 4 mg by mouth every 8 (eight) hours as needed (for muscle spasms).       . topiramate (TOPAMAX) 50 MG tablet Take 100 mg by mouth daily.       . traZODone (DESYREL) 100 MG tablet Take 200 mg by mouth at bedtime.       Marland Kitchen venlafaxine XR (EFFEXOR-XR) 75 MG 24 hr capsule Take 150 mg by mouth every morning.      . Vitamin D, Ergocalciferol, (DRISDOL) 50000 UNITS CAPS capsule Take one by mouth once a week x 6 weeks.  6 capsule  0   No current facility-administered  medications for this visit.    Allergies  Allergen Reactions  . Azithromycin     rash  . Depakote [Divalproex Sodium]     Hair loss  . Lithium     Rash  . Penicillins     rash  . Seroquel [Quetiapine Fumarate]     irritable  . Sulfa Antibiotics     Rash    Family History  Problem Relation Age of Onset  . Diabetes Mother   . Hypertension Mother   . Cancer Father     brain tumor    History   Social History  . Marital Status: Married    Spouse Name: N/A    Number of Children: N/A  . Years of Education: N/A   Occupational History  . Not on file.   Social History Main Topics  . Smoking status: Never Smoker   . Smokeless tobacco: Not on file  . Alcohol Use: No  . Drug Use: No  . Sexual Activity: Not on file   Other Topics Concern  . Not on file   Social History Narrative   Married 20+ years   From [place]   On disability for fibromyalgia     Constitutional: Pt reports fever. Denies malaise, fatigue, headache or abrupt weight changes.  Gastrointestinal: Pt reports nausea and  diarrhea. Denies abdominal pain, bloating, constipation, or blood in the stool.  GU: Denies urgency, frequency, pain with urination, burning sensation, blood in urine, odor or discharge.  Neurological: Denies dizziness, difficulty with memory, difficulty with speech or problems with balance and coordination.   No other specific complaints in a complete review of systems (except as listed in HPI above).  Objective:   Physical Exam   BP 90/60  Pulse 112  Temp(Src) 98.2 F (36.8 C) (Oral)  Resp 20  Ht 5\' 9"  (1.753 m)  Wt 169 lb 8 oz (76.885 kg)  BMI 25.02 kg/m2 Wt Readings from Last 3 Encounters:  03/28/13 169 lb 8 oz (76.885 kg)  02/26/13 173 lb (78.472 kg)  02/08/13 170 lb (77.111 kg)    General: Appears her stated age, overweight but well developed, well nourished in NAD. Skin: Warm, dry and intact. No rashes, lesions or ulcerations noted. Increased tenting. Cardiovascular: Normal rate and rhythm. S1,S2 noted.  No murmur, rubs or gallops noted. No JVD or BLE edema. No carotid bruits noted. Pulmonary/Chest: Normal effort and positive vesicular breath sounds. No respiratory distress. No wheezes, rales or ronchi noted.  Abdomen: Soft and nontender. Hypoactive bowel sounds, no bruits noted. No distention or masses noted. Liver, spleen and kidneys non palpable.  Neurological: Alert and oriented. Cranial nerves II-XII intact. Coordination normal. +DTRs bilaterally.  BMET    Component Value Date/Time   NA 139 03/02/2013 1718   K 3.6 03/02/2013 1718   CL 110 03/02/2013 1718   CO2 19 03/02/2013 1718   GLUCOSE 103* 03/02/2013 1718   BUN 8 03/02/2013 1718   CREATININE 0.92 03/02/2013 1718   CALCIUM 8.8 03/02/2013 1718   GFRNONAA 75* 03/02/2013 1718   GFRAA 87* 03/02/2013 1718    Lipid Panel     Component Value Date/Time   CHOL 245* 02/26/2013 1053   TRIG 742.0* 02/26/2013 1053   HDL 34.20* 02/26/2013 1053   CHOLHDL 7 02/26/2013 1053   VLDL 148.4* 02/26/2013 1053    CBC    Component Value  Date/Time   WBC 12.1* 03/02/2013 1718   RBC 5.10 03/02/2013 1718   HGB 14.2 03/02/2013 1718  HCT 42.5 03/02/2013 1718   PLT 239 03/02/2013 1718   MCV 83.3 03/02/2013 1718   MCH 27.8 03/02/2013 1718   MCHC 33.4 03/02/2013 1718   RDW 14.2 03/02/2013 1718   LYMPHSABS 2.2 02/08/2013 0930   MONOABS 0.4 02/08/2013 0930   EOSABS 0.1 02/08/2013 0930   BASOSABS 0.1 02/08/2013 0930    Hgb A1C No results found for this basename: HGBA1C        Assessment & Plan:   Nausea and diarrhea, unknown origin:  Low suspicion for C diff or parasite infection but will check stool culture and Cdiff eRx for Zofran ODT for nausea Try to drink plenty of fluids to avoid dehydration No Imodium until Cdiff results come back Information given on diet for diarrhea  Will follow up with after test results in. If diarrhea worsens or you become dehydrated, go to the nearest ER/Urgent Care

## 2013-03-28 NOTE — Patient Instructions (Signed)
Diarrhea Diarrhea is frequent loose and watery bowel movements. It can cause you to feel weak and dehydrated. Dehydration can cause you to become tired and thirsty, have a dry mouth, and have decreased urination that often is dark yellow. Diarrhea is a sign of another problem, most often an infection that will not last long. In most cases, diarrhea typically lasts 2 3 days. However, it can last longer if it is a sign of something more serious. It is important to treat your diarrhea as directed by your caregive to lessen or prevent future episodes of diarrhea. CAUSES  Some common causes include:  Gastrointestinal infections caused by viruses, bacteria, or parasites.  Food poisoning or food allergies.  Certain medicines, such as antibiotics, chemotherapy, and laxatives.  Artificial sweeteners and fructose.  Digestive disorders. HOME CARE INSTRUCTIONS  Ensure adequate fluid intake (hydration): have 1 cup (8 oz) of fluid for each diarrhea episode. Avoid fluids that contain simple sugars or sports drinks, fruit juices, whole milk products, and sodas. Your urine should be clear or pale yellow if you are drinking enough fluids. Hydrate with an oral rehydration solution that you can purchase at pharmacies, retail stores, and online. You can prepare an oral rehydration solution at home by mixing the following ingredients together:    tsp table salt.   tsp baking soda.   tsp salt substitute containing potassium chloride.  1  tablespoons sugar.  1 L (34 oz) of water.  Certain foods and beverages may increase the speed at which food moves through the gastrointestinal (GI) tract. These foods and beverages should be avoided and include:  Caffeinated and alcoholic beverages.  High-fiber foods, such as raw fruits and vegetables, nuts, seeds, and whole grain breads and cereals.  Foods and beverages sweetened with sugar alcohols, such as xylitol, sorbitol, and mannitol.  Some foods may be well  tolerated and may help thicken stool including:  Starchy foods, such as rice, toast, pasta, low-sugar cereal, oatmeal, grits, baked potatoes, crackers, and bagels.  Bananas.  Applesauce.  Add probiotic-rich foods to help increase healthy bacteria in the GI tract, such as yogurt and fermented milk products.  Wash your hands well after each diarrhea episode.  Only take over-the-counter or prescription medicines as directed by your caregiver.  Take a warm bath to relieve any burning or pain from frequent diarrhea episodes. SEEK IMMEDIATE MEDICAL CARE IF:   You are unable to keep fluids down.  You have persistent vomiting.  You have blood in your stool, or your stools are black and tarry.  You do not urinate in 6 8 hours, or there is only a small amount of very dark urine.  You have abdominal pain that increases or localizes.  You have weakness, dizziness, confusion, or lightheadedness.  You have a severe headache.  Your diarrhea gets worse or does not get better.  You have a fever or persistent symptoms for more than 2 3 days.  You have a fever and your symptoms suddenly get worse. MAKE SURE YOU:   Understand these instructions.  Will watch your condition.  Will get help right away if you are not doing well or get worse. Document Released: 05/28/2002 Document Revised: 05/24/2012 Document Reviewed: 02/13/2012 [employer] Patient Information 2014 [employer]  

## 2013-03-30 NOTE — Addendum Note (Signed)
Addended by: Alvina Chou on: 03/30/2013 10:05 AM   Modules accepted: Orders

## 2013-04-03 ENCOUNTER — Other Ambulatory Visit: Payer: Self-pay | Admitting: *Deleted

## 2013-04-03 ENCOUNTER — Ambulatory Visit (INDEPENDENT_AMBULATORY_CARE_PROVIDER_SITE_OTHER): Payer: BC Managed Care – PPO | Admitting: Family Medicine

## 2013-04-03 VITALS — BP 108/68 | HR 88 | Temp 97.9°F | Ht 69.0 in | Wt 172.8 lb

## 2013-04-03 DIAGNOSIS — Z0000 Encounter for general adult medical examination without abnormal findings: Secondary | ICD-10-CM | POA: Insufficient documentation

## 2013-04-03 DIAGNOSIS — E785 Hyperlipidemia, unspecified: Secondary | ICD-10-CM | POA: Insufficient documentation

## 2013-04-03 DIAGNOSIS — E559 Vitamin D deficiency, unspecified: Secondary | ICD-10-CM | POA: Insufficient documentation

## 2013-04-03 DIAGNOSIS — M8580 Other specified disorders of bone density and structure, unspecified site: Secondary | ICD-10-CM | POA: Insufficient documentation

## 2013-04-03 DIAGNOSIS — M899 Disorder of bone, unspecified: Secondary | ICD-10-CM

## 2013-04-03 DIAGNOSIS — Z309 Encounter for contraceptive management, unspecified: Secondary | ICD-10-CM

## 2013-04-03 LAB — STOOL CULTURE

## 2013-04-03 LAB — POCT URINE PREGNANCY: Preg Test, Ur: NEGATIVE

## 2013-04-03 MED ORDER — MEDROXYPROGESTERONE ACETATE 150 MG/ML IM SUSP
150.0000 mg | Freq: Once | INTRAMUSCULAR | Status: AC
  Administered 2013-04-03: 150 mg via INTRAMUSCULAR

## 2013-04-03 MED ORDER — TOPIRAMATE 50 MG PO TABS
100.0000 mg | ORAL_TABLET | Freq: Every day | ORAL | Status: DC
Start: 2013-04-03 — End: 2013-09-25

## 2013-04-03 MED ORDER — ALENDRONATE SODIUM 70 MG PO TABS: 70.0000 mg | ORAL_TABLET | ORAL | Status: DC

## 2013-04-03 MED ORDER — CLONAZEPAM 1 MG PO TABS: 1.0000 mg | ORAL_TABLET | Freq: Two times a day (BID) | ORAL | Status: DC | PRN

## 2013-04-03 NOTE — Telephone Encounter (Signed)
Last filled for # 60 on 03/01/13

## 2013-04-03 NOTE — Progress Notes (Signed)
Subjective:    Patient ID: Melissa [name], female    DOB: 1971/06/12, 42 y.o.   MRN: [id number]  HPI  42 yo pt with complicated medical history here with her for CPX.  Sees Dr. Seymour Bars for yearly GYN visits. UTD mammogram (02/2013). Does receive depo injections and would like to come here rather than drive to [place] to receive them.  Osteopenia- has been on depo provera for over 10 years.  T score -1.6.  She is now on high dose vit D and calcium supplements.  Also trying to eat more yogurt and cheeses.  Does not like milk or several other sources of non dairy calcium like salmon, oatmeal, tofu.  Fibromyalgia- followed by pain management.  On Gabapentin, Naltrexone and receives lidocaine infusions.   Still has chronic pain.   Started supplements from Dr. Fatima Sanger rheum handout I gave to her last time.  Vit D deficiency- 18 this month- she is taking high dose Vit D as I prescribed.  Migraines- have been well controlled on prophylactic topamax.  Uses Imitrex for abortive therapy.  HLD- TG extremely elevated but she refuses fasting labs and or lipid tx.    Patient Active Problem List   Diagnosis Date Noted  . Unspecified vitamin D deficiency 04/03/2013  . HLD (hyperlipidemia) 04/03/2013  . Routine general medical examination at a health care facility 04/03/2013  . Osteopenia 04/03/2013  . POTS (postural orthostatic tachycardia syndrome) 02/26/2013  . Syncope 11/29/2012  . Migraine 11/29/2012  . Fibromyalgia   . Bipolar disorder    Past Medical History  Diagnosis Date  . Bipolar disorder   . Fibromyalgia     on disability  . Migraine   . Panic attack   . Muscle spasm   . Uterine perforation     hx of  . Syncope   . POTS (postural orthostatic tachycardia syndrome)    Past Surgical History  Procedure Laterality Date  . Dilation and curettage of uterus     History  Substance Use Topics  . Smoking status: Never Smoker   . Smokeless tobacco: Not on file  . Alcohol  Use: No   Family History  Problem Relation Age of Onset  . Diabetes Mother   . Hypertension Mother   . Cancer Father     brain tumor   Allergies  Allergen Reactions  . Azithromycin     rash  . Depakote [Divalproex Sodium]     Hair loss  . Lithium     Rash  . Penicillins     rash  . Seroquel [Quetiapine Fumarate]     irritable  . Sulfa Antibiotics     Rash   Current Outpatient Prescriptions on File Prior to Visit  Medication Sig Dispense Refill  . acetaminophen (TYLENOL) 500 MG tablet Take 1,000 mg by mouth every 6 (six) hours as needed for pain.      Marland Kitchen atenolol (TENORMIN) 25 MG tablet Take 12.5 mg by mouth daily.      . clonazePAM (KLONOPIN) 1 MG tablet Take 1 mg by mouth 3 (three) times daily as needed for anxiety.       . fexofenadine (ALLEGRA) 180 MG tablet Take 180 mg by mouth daily.      Marland Kitchen gabapentin (NEURONTIN) 300 MG capsule Take 600 mg by mouth 2 (two) times daily.      . medroxyPROGESTERone (DEPO-PROVERA) 150 MG/ML injection Inject 150 mg into the muscle every 3 (three) months.      Marland Kitchen NALTREXONE HCL  PO Take 4.5 mg by mouth daily. Pt has medication compounded      . ondansetron (ZOFRAN-ODT) 4 MG disintegrating tablet Take 1 tablet (4 mg total) by mouth every 8 (eight) hours as needed for nausea.  20 tablet  0  . oxyCODONE-acetaminophen (PERCOCET/ROXICET) 5-325 MG per tablet Take 1 tablet by mouth every 6 (six) hours as needed for pain.  60 tablet  0  . ranitidine (ZANTAC) 150 MG tablet Take 150 mg by mouth daily.      . sodium chloride 0.9 % Inject 500 mLs into the vein once.  125 mL  0  . SUMAtriptan (IMITREX) 100 MG tablet Take 100 mg by mouth daily as needed for migraine.      Marland Kitchen tiZANidine (ZANAFLEX) 4 MG tablet Take 4 mg by mouth every 8 (eight) hours as needed (for muscle spasms).       . topiramate (TOPAMAX) 50 MG tablet Take 100 mg by mouth daily.       . traZODone (DESYREL) 100 MG tablet Take 200 mg by mouth at bedtime.       Marland Kitchen venlafaxine XR (EFFEXOR-XR) 75  MG 24 hr capsule Take 150 mg by mouth every morning.      . Vitamin D, Ergocalciferol, (DRISDOL) 50000 UNITS CAPS capsule Take one by mouth once a week x 6 weeks.  6 capsule  0   No current facility-administered medications on file prior to visit.   The PMH, PSH, Social History, Family History, Medications, and allergies have been reviewed in [facility], and have been updated if relevant.     Review of Systems See HPI +malaise No changes in bowel habits No CP or SOB No Edema No vaginal pain    Objective:   Physical Exam BP 108/68  Pulse 88  Temp(Src) 97.9 F (36.6 C) (Oral)  Ht 5\' 9"  (1.753 m)  Wt 172 lb 12 oz (78.359 kg)  BMI 25.5 kg/m2  General:  Well-developed,well-nourished,in no acute distress; alert,appropriate and cooperative throughout examination Head:  normocephalic and atraumatic.   Eyes:  vision grossly intact, pupils equal, pupils round, and pupils reactive to light.   Ears:  R ear normal and L ear normal.   Nose:  no external deformity.   Mouth:  good dentition.   Neck:  No deformities, masses, or tenderness noted. Lungs:  Normal respiratory effort, chest expands symmetrically. Lungs are clear to auscultation, no crackles or wheezes. Heart:  Normal rate and regular rhythm. S1 and S2 normal without gallop, murmur, click, rub or other extra sounds. Abdomen:  Bowel sounds positive,abdomen soft and non-tender without masses, organomegaly or hernias noted. Msk:  No deformity or scoliosis noted of thoracic or lumbar spine.  No TTP over spine, SLR neg bilaterally. Extremities:  No clubbing, cyanosis, edema, or deformity noted with normal full range of motion of all joints.   Neurologic:  alert & oriented X3 and gait normal.   Skin:  Intact without suspicious lesions or rashes Cervical Nodes:  No lymphadenopathy noted Axillary Nodes:  No palpable lymphadenopathy Psych:  Cognition and judgment appear intact. Alert and cooperative with normal attention span and concentration.  No apparent delusions, illusions, hallucinations     Assessment & Plan:   1. Unspecified vitamin D deficiency Continue Vit D supplementation.  Schedule follow up labs.  2. HLD (hyperlipidemia) Refusing repeat fasting labs and lipid medication.  3. Routine general medical examination at a health care facility Reviewed preventive care protocols, scheduled due services, and updated immunizations Discussed nutrition,  exercise, diet, and healthy lifestyle.   4. Osteopenia New.  Discussed tx options.  She is willing to start fosamax.  eRx sent.  She will continue calcium and vit D. Repeat DEXA in 1 year.  5. Contraception management  - POCT urine pregnancy - medroxyPROGESTERone (DEPO-PROVERA) injection 150 mg; Inject 1 mL (150 mg total) into the muscle once.

## 2013-04-03 NOTE — Patient Instructions (Signed)
Good to see you. We are starting Fosamax- please take as directed. If you cannot tolerate this, we can try Prolia which is a twice yearly injection.  Please make sure you have a lab appointment to recheck your Vitamin D within the next 3 months.

## 2013-04-05 NOTE — Telephone Encounter (Signed)
rx called into pharmacy

## 2013-04-12 ENCOUNTER — Institutional Professional Consult (permissible substitution): Payer: BC Managed Care – PPO | Admitting: Internal Medicine

## 2013-04-18 ENCOUNTER — Encounter (HOSPITAL_COMMUNITY): Payer: Self-pay | Admitting: Emergency Medicine

## 2013-04-18 ENCOUNTER — Emergency Department (HOSPITAL_COMMUNITY)
Admission: EM | Admit: 2013-04-18 | Discharge: 2013-04-18 | Disposition: A | Discharge status: Home / Self Care | Payer: Medicare Other | Attending: Emergency Medicine | Admitting: Emergency Medicine

## 2013-04-18 DIAGNOSIS — IMO0001 Reserved for inherently not codable concepts without codable children: Secondary | ICD-10-CM | POA: Insufficient documentation

## 2013-04-18 DIAGNOSIS — G90A Postural orthostatic tachycardia syndrome (POTS): Secondary | ICD-10-CM

## 2013-04-18 DIAGNOSIS — Z79899 Other long term (current) drug therapy: Secondary | ICD-10-CM | POA: Insufficient documentation

## 2013-04-18 DIAGNOSIS — R197 Diarrhea, unspecified: Secondary | ICD-10-CM | POA: Insufficient documentation

## 2013-04-18 DIAGNOSIS — R5381 Other malaise: Secondary | ICD-10-CM | POA: Insufficient documentation

## 2013-04-18 DIAGNOSIS — R6883 Chills (without fever): Secondary | ICD-10-CM | POA: Insufficient documentation

## 2013-04-18 DIAGNOSIS — R110 Nausea: Secondary | ICD-10-CM | POA: Insufficient documentation

## 2013-04-18 DIAGNOSIS — N390 Urinary tract infection, site not specified: Secondary | ICD-10-CM

## 2013-04-18 DIAGNOSIS — Z880 Allergy status to penicillin: Secondary | ICD-10-CM | POA: Insufficient documentation

## 2013-04-18 DIAGNOSIS — R000 Tachycardia, unspecified: Secondary | ICD-10-CM | POA: Insufficient documentation

## 2013-04-18 DIAGNOSIS — R42 Dizziness and giddiness: Secondary | ICD-10-CM | POA: Insufficient documentation

## 2013-04-18 DIAGNOSIS — F319 Bipolar disorder, unspecified: Secondary | ICD-10-CM | POA: Insufficient documentation

## 2013-04-18 DIAGNOSIS — I951 Orthostatic hypotension: Secondary | ICD-10-CM | POA: Insufficient documentation

## 2013-04-18 LAB — URINALYSIS, ROUTINE W REFLEX MICROSCOPIC
Nitrite: NEGATIVE
Specific Gravity, Urine: 1.032 — ABNORMAL HIGH (ref 1.005–1.030)
pH: 6 (ref 5.0–8.0)

## 2013-04-18 LAB — CBC WITH DIFFERENTIAL/PLATELET
Basophils Absolute: 0.1 10*3/uL (ref 0.0–0.1)
Lymphocytes Relative: 33 % (ref 12–46)
Lymphs Abs: 2.5 10*3/uL (ref 0.7–4.0)
Neutro Abs: 4.4 10*3/uL (ref 1.7–7.7)
Platelets: 248 10*3/uL (ref 150–400)
RBC: 4.83 MIL/uL (ref 3.87–5.11)
RDW: 14 % (ref 11.5–15.5)
WBC: 7.6 10*3/uL (ref 4.0–10.5)

## 2013-04-18 LAB — URINE MICROSCOPIC-ADD ON

## 2013-04-18 LAB — POCT I-STAT, CHEM 8
BUN: 8 mg/dL (ref 6–23)
Calcium, Ion: 1.21 mmol/L (ref 1.12–1.23)
Creatinine, Ser: 1.2 mg/dL — ABNORMAL HIGH (ref 0.50–1.10)
Glucose, Bld: 84 mg/dL (ref 70–99)
Hemoglobin: 13.9 g/dL (ref 12.0–15.0)
TCO2: 17 mmol/L (ref 0–100)

## 2013-04-18 MED ORDER — SODIUM CHLORIDE 0.9 % IV SOLN
1000.0000 mL | Freq: Once | INTRAVENOUS | Status: AC
  Administered 2013-04-18: 1000 mL via INTRAVENOUS

## 2013-04-18 MED ORDER — CEPHALEXIN 500 MG PO CAPS: 500.0000 mg | ORAL_CAPSULE | Freq: Four times a day (QID) | ORAL | Status: DC

## 2013-04-18 MED ORDER — SODIUM CHLORIDE 0.9 % IV SOLN: 1000.0000 mL | INTRAVENOUS | Status: DC

## 2013-04-18 MED ORDER — ONDANSETRON HCL 4 MG/2ML IJ SOLN
4.0000 mg | Freq: Once | INTRAMUSCULAR | Status: AC
  Administered 2013-04-18: 4 mg via INTRAVENOUS
  Filled 2013-04-18: qty 2

## 2013-04-18 NOTE — ED Notes (Signed)
Pt walked to restroom and became lightheaded and as I went to get a wheel chair pt was in the floor when I came back. Pt states she sat on the floor and her husband laid her back, pt states that's how she passes out. I have pt a sternum rub with pt waking up and a/o x4

## 2013-04-18 NOTE — ED Provider Notes (Addendum)
CSN: [id number]     Arrival date & time 04/18/13  1200 History   First MD Initiated Contact with Patient 04/18/13 1255     Chief Complaint  Patient presents with  . Dehydration    HPI Pt states she has not been feeling well lately.  She has history of POTS and needs to drink 3-5 liters a day.  Pt states she cannot get these fluids in usually and has to go to [facility] weekly for a couple of liters of fluid.  She has not been able to get to [facility] due to family circumstances.  She has been feeling weaker and dizzier.  The symptoms have progressed.  She constantly feels like she might pass out. Past Medical History  Diagnosis Date  . Bipolar disorder   . Fibromyalgia     on disability  . Migraine   . Panic attack   . Muscle spasm   . Uterine perforation     hx of  . Syncope   . POTS (postural orthostatic tachycardia syndrome)    Past Surgical History  Procedure Laterality Date  . Dilation and curettage of uterus     Family History  Problem Relation Age of Onset  . Diabetes Mother   . Hypertension Mother   . Cancer Father     brain tumor   History  Substance Use Topics  . Smoking status: Never Smoker   . Smokeless tobacco: Not on file  . Alcohol Use: No   OB History   Grav Para Term Preterm Abortions TAB SAB Ect Mult Living                 Review of Systems  Constitutional: Positive for chills. Negative for fever.  Gastrointestinal: Positive for nausea and diarrhea. Negative for vomiting.  Genitourinary: Negative for dysuria.  All other systems reviewed and are negative.    Allergies  Azithromycin; Depakote; Lithium; Penicillins; Seroquel; and Sulfa antibiotics  Home Medications   Current Outpatient Rx  Name  Route  Sig  Dispense  Refill  . acetaminophen (TYLENOL) 500 MG tablet   Oral   Take 1,000 mg by mouth every 6 (six) hours as needed for pain.         Marland Kitchen alendronate (FOSAMAX) 70 MG tablet   Oral   Take 70 mg by mouth every 7 (seven) days. Take with a full  glass of water on an empty stomach. Take on Wednesday.         Marland Kitchen atenolol (TENORMIN) 25 MG tablet   Oral   Take 12.5 mg by mouth daily.         . clonazePAM (KLONOPIN) 1 MG tablet   Oral   Take 1 mg by mouth 3 (three) times daily as needed for anxiety.         . fexofenadine (ALLEGRA) 180 MG tablet   Oral   Take 180 mg by mouth daily.         Marland Kitchen gabapentin (NEURONTIN) 300 MG capsule   Oral   Take 600 mg by mouth 2 (two) times daily.         . medroxyPROGESTERone (DEPO-PROVERA) 150 MG/ML injection   Intramuscular   Inject 150 mg into the muscle every 3 (three) months.         Marland Kitchen NALTREXONE HCL PO   Oral   Take 4.5 mg by mouth daily. Pt has medication compounded         . ondansetron (ZOFRAN-ODT) 4 MG disintegrating tablet  Oral   Take 1 tablet (4 mg total) by mouth every 8 (eight) hours as needed for nausea.   20 tablet   0   . oxyCODONE-acetaminophen (PERCOCET/ROXICET) 5-325 MG per tablet   Oral   Take 1 tablet by mouth every 6 (six) hours as needed for pain.   60 tablet   0   . ranitidine (ZANTAC) 150 MG tablet   Oral   Take 150 mg by mouth daily.         . SUMAtriptan (IMITREX) 100 MG tablet   Oral   Take 100 mg by mouth daily as needed for migraine.         Marland Kitchen tiZANidine (ZANAFLEX) 4 MG tablet   Oral   Take 4 mg by mouth every 8 (eight) hours as needed (for muscle spasms).          . topiramate (TOPAMAX) 50 MG tablet   Oral   Take 2 tablets (100 mg total) by mouth daily.   60 tablet   3   . traZODone (DESYREL) 100 MG tablet   Oral   Take 200 mg by mouth at bedtime.          Marland Kitchen venlafaxine XR (EFFEXOR-XR) 75 MG 24 hr capsule   Oral   Take 150 mg by mouth every morning.         . Vitamin D, Ergocalciferol, (DRISDOL) 50000 UNITS CAPS capsule      Take one by mouth once a week x 6 weeks.   6 capsule   0   . cephALEXin (KEFLEX) 500 MG capsule   Oral   Take 1 capsule (500 mg total) by mouth 4 (four) times daily.   20 capsule    0    BP 110/74  Pulse 98  Temp(Src) 97.6 F (36.4 C) (Oral)  Resp 16  Ht 5\' 9"  (1.753 m)  Wt 170 lb (77.111 kg)  BMI 25.09 kg/m2  SpO2 96% Physical Exam  Nursing note and vitals reviewed. Constitutional: She appears well-developed and well-nourished. No distress.  HENT:  Head: Normocephalic and atraumatic.  Right Ear: External ear normal.  Left Ear: External ear normal.  Eyes: Conjunctivae are normal. Right eye exhibits no discharge. Left eye exhibits no discharge. No scleral icterus.  Neck: Neck supple. No tracheal deviation present.  Cardiovascular: Normal rate, regular rhythm and intact distal pulses.   Pulmonary/Chest: Effort normal and breath sounds normal. No stridor. No respiratory distress. She has no wheezes. She has no rales.  Abdominal: Soft. Bowel sounds are normal. She exhibits no distension. There is no tenderness. There is no rebound and no guarding.  Musculoskeletal: She exhibits no edema and no tenderness.  Neurological: She is alert. She has normal strength. No sensory deficit. Cranial nerve deficit:  no gross defecits noted. She exhibits normal muscle tone. She displays no seizure activity. Coordination normal.  Skin: Skin is warm and dry. No rash noted.  Psychiatric: She has a normal mood and affect.    ED Course  Procedures (including critical care time) Labs Review Labs Reviewed  URINALYSIS, ROUTINE W REFLEX MICROSCOPIC - Abnormal; Notable for the following:    APPearance CLOUDY (*)    Specific Gravity, Urine 1.032 (*)    Leukocytes, UA MODERATE (*)    All other components within normal limits  URINE MICROSCOPIC-ADD ON - Abnormal; Notable for the following:    Squamous Epithelial / LPF FEW (*)    Bacteria, UA FEW (*)    All other components  within normal limits  POCT I-STAT, CHEM 8 - Abnormal; Notable for the following:    Potassium 3.4 (*)    Creatinine, Ser 1.20 (*)    All other components within normal limits  URINE CULTURE  CBC WITH  DIFFERENTIAL   Imaging Review No results found.  EKG Interpretation     Ventricular Rate:  64 PR Interval:  170 QRS Duration: 81 QT Interval:  423 QTC Calculation: 436 R Axis:   81 Text Interpretation:  Sinus rhythm Low voltage, precordial leads No significant change since last tracing except rate slower           While in the emergency department, the patient walked to the bathroom. She began feeling very lightheaded.  The nurse  found her lying down on the ground. She was helped to the floor by her husband. The patient is now alert  MDM   1. POTS (postural orthostatic tachycardia syndrome)   2. UTI (urinary tract infection)     The patient has known postural orthostatic tachycardic syndrome.  She has had recurrent syncopal evidence for associated with this condition.   The patient has seen cardiology in [place]. He plans to have her get weekly infusions of normal saline. The patient has not been able to do that for the last couple of weeks. She was hydrated here in emergent apartment with several liters of fluid. Her electrolytes are unremarkable. The patient's blood pressure is 110/74 which is normal for her.      Incidentally, the patient appears to have a urinary tract infection. I will discharge her home on a course of Keflex. Her penicillin allergy is a rash   Celene Kras, MD 04/18/13 1550  Celene Kras, MD 04/18/13 [phone]

## 2013-04-18 NOTE — Progress Notes (Signed)
   CARE MANAGEMENT ED NOTE 04/18/2013  Patient:  Melissa [name], Melissa [name]   Account Number:  [id number]  Date Initiated:  04/18/2013  Documentation initiated by:  Edd Arbour  Subjective/Objective Assessment:   42 yr old female d/c from [facility] ED on keflex 500 mg po qid CM received a call from [place] [facility] requesting change in Rx for pt who is allergic to PCN Pt at the pharmacy to get rx filled     Subjective/Objective Assessment Detail:   Pt also allergic to sulfa and zithromax     Action/Plan:   CM spoke with Dr Cheri Rous who provided oral order change to Cipro 500 mg po bid for 7 days total tab count 14 no refills   Action/Plan Detail:   Anticipated DC Date:  04/18/2013     Status Recommendation to Physician:   Result of Recommendation:    Other ED Services  Consult Working Plan    DC Planning Services  Medication Assistance  Other    Choice offered to / List presented to:            Status of service:  Completed, signed off  ED Comments:   ED Comments Detail:

## 2013-04-18 NOTE — ED Notes (Signed)
Pt states last night started to feel dehydrated and cold chills/ hot flashes, states she has POTS syndrome and requires IV fluids at times.

## 2013-04-19 ENCOUNTER — Telehealth: Payer: Self-pay | Admitting: Internal Medicine

## 2013-04-19 NOTE — Telephone Encounter (Signed)
New message     Dr Graciela Husbands was going was going to make it possible where pt could go to [facility] and get outpt fluid.  She has not heard and wanted to know if this has been scheduled.

## 2013-04-20 LAB — URINE CULTURE

## 2013-04-20 NOTE — Telephone Encounter (Signed)
Explained to patient that I am in process of getting order form from short stay so that Dr. Graciela Husbands may fill out order request allowing her to get IVF boluses. I will contact her once we have finalized order and sent it to short stay department. Patient verbalized understanding and agreeable to plan.

## 2013-04-27 NOTE — Telephone Encounter (Signed)
Left message with husband (per ok from wife) that POTS orders for fluids are at short stay - so patient may go if she needs a bolus. Patient's husband very thankful that we got this arranged for his wife. He verbalized understanding and agreeable to pass information to patient.

## 2013-05-01 ENCOUNTER — Other Ambulatory Visit (HOSPITAL_COMMUNITY): Payer: Self-pay | Admitting: *Deleted

## 2013-05-02 ENCOUNTER — Other Ambulatory Visit (HOSPITAL_COMMUNITY): Payer: Self-pay | Admitting: *Deleted

## 2013-05-02 ENCOUNTER — Encounter (HOSPITAL_COMMUNITY)
Admission: RE | Admit: 2013-05-02 | Discharge: 2013-05-02 | Disposition: A | Discharge status: Home / Self Care | Payer: Medicare Other | Source: Ambulatory Visit | Attending: Internal Medicine | Admitting: Internal Medicine

## 2013-05-02 DIAGNOSIS — I951 Orthostatic hypotension: Secondary | ICD-10-CM | POA: Insufficient documentation

## 2013-05-02 DIAGNOSIS — I498 Other specified cardiac arrhythmias: Secondary | ICD-10-CM | POA: Insufficient documentation

## 2013-05-02 MED ORDER — SODIUM CHLORIDE 0.9 % IV SOLN
Freq: Once | INTRAVENOUS | Status: AC
  Administered 2013-05-02: 1000 mL via INTRAVENOUS

## 2013-05-08 ENCOUNTER — Telehealth: Payer: Self-pay | Admitting: Internal Medicine

## 2013-05-08 NOTE — Telephone Encounter (Signed)
Patient not at home - husband explains that they want to see if Dr. Graciela Husbands is ok to increase her prn IVF bolus to 2L (it is currently written for 1L). Patient also interested in Zofran prn when receiving fluids. I will discuss with Dr Graciela Husbands and get back with them on decision. Patient's husband agreeable to plan.

## 2013-05-08 NOTE — Telephone Encounter (Signed)
New Problem:  Pt states she is calling in regards to her short stay infusion and she has some concerns about the orders. Pt states she wants to see if the doctor can change the orders. Please advise

## 2013-05-09 ENCOUNTER — Encounter (HOSPITAL_COMMUNITY): Payer: Self-pay | Admitting: Emergency Medicine

## 2013-05-09 ENCOUNTER — Telehealth: Payer: Self-pay | Admitting: Internal Medicine

## 2013-05-09 ENCOUNTER — Emergency Department (HOSPITAL_COMMUNITY)
Admission: EM | Admit: 2013-05-09 | Discharge: 2013-05-09 | Disposition: A | Discharge status: Home / Self Care | Payer: Medicare Other | Attending: Emergency Medicine | Admitting: Emergency Medicine

## 2013-05-09 DIAGNOSIS — G90A Postural orthostatic tachycardia syndrome (POTS): Secondary | ICD-10-CM

## 2013-05-09 DIAGNOSIS — I951 Orthostatic hypotension: Secondary | ICD-10-CM | POA: Insufficient documentation

## 2013-05-09 DIAGNOSIS — G43909 Migraine, unspecified, not intractable, without status migrainosus: Secondary | ICD-10-CM | POA: Insufficient documentation

## 2013-05-09 DIAGNOSIS — F319 Bipolar disorder, unspecified: Secondary | ICD-10-CM | POA: Insufficient documentation

## 2013-05-09 DIAGNOSIS — R5381 Other malaise: Secondary | ICD-10-CM | POA: Insufficient documentation

## 2013-05-09 DIAGNOSIS — Z880 Allergy status to penicillin: Secondary | ICD-10-CM | POA: Insufficient documentation

## 2013-05-09 DIAGNOSIS — Z8739 Personal history of other diseases of the musculoskeletal system and connective tissue: Secondary | ICD-10-CM | POA: Insufficient documentation

## 2013-05-09 DIAGNOSIS — Z3202 Encounter for pregnancy test, result negative: Secondary | ICD-10-CM | POA: Insufficient documentation

## 2013-05-09 DIAGNOSIS — Z79899 Other long term (current) drug therapy: Secondary | ICD-10-CM | POA: Insufficient documentation

## 2013-05-09 DIAGNOSIS — R000 Tachycardia, unspecified: Secondary | ICD-10-CM | POA: Insufficient documentation

## 2013-05-09 DIAGNOSIS — F410 Panic disorder [episodic paroxysmal anxiety] without agoraphobia: Secondary | ICD-10-CM | POA: Insufficient documentation

## 2013-05-09 LAB — POCT I-STAT, CHEM 8
BUN: 5 mg/dL — ABNORMAL LOW (ref 6–23)
Calcium, Ion: 1.14 mmol/L (ref 1.12–1.23)
Chloride: 112 mEq/L (ref 96–112)
Creatinine, Ser: 1.1 mg/dL (ref 0.50–1.10)
Glucose, Bld: 92 mg/dL (ref 70–99)
HCT: 41 % (ref 36.0–46.0)
Hemoglobin: 13.9 g/dL (ref 12.0–15.0)
Potassium: 3.6 mEq/L (ref 3.5–5.1)
Sodium: 143 mEq/L (ref 135–145)
TCO2: 16 mmol/L (ref 0–100)

## 2013-05-09 LAB — CBC
HCT: 40.3 % (ref 36.0–46.0)
Hemoglobin: 13.6 g/dL (ref 12.0–15.0)
MCH: 28.5 pg (ref 26.0–34.0)
MCHC: 33.7 g/dL (ref 30.0–36.0)
MCV: 84.3 fL (ref 78.0–100.0)
Platelets: 209 10*3/uL (ref 150–400)
RBC: 4.78 MIL/uL (ref 3.87–5.11)
RDW: 14 % (ref 11.5–15.5)
WBC: 7.4 10*3/uL (ref 4.0–10.5)

## 2013-05-09 LAB — POCT PREGNANCY, URINE: Preg Test, Ur: NEGATIVE

## 2013-05-09 MED ORDER — ONDANSETRON 8 MG PO TBDP
8.0000 mg | ORAL_TABLET | Freq: Once | ORAL | Status: AC
  Administered 2013-05-09: 8 mg via ORAL
  Filled 2013-05-09: qty 1

## 2013-05-09 MED ORDER — OXYCODONE-ACETAMINOPHEN 5-325 MG PO TABS
1.0000 | ORAL_TABLET | Freq: Once | ORAL | Status: AC
  Administered 2013-05-09: 1 via ORAL
  Filled 2013-05-09: qty 1

## 2013-05-09 MED ORDER — ONDANSETRON HCL 4 MG/2ML IJ SOLN
4.0000 mg | Freq: Once | INTRAMUSCULAR | Status: AC
  Administered 2013-05-09: 4 mg via INTRAVENOUS
  Filled 2013-05-09: qty 2

## 2013-05-09 MED ORDER — SODIUM CHLORIDE 0.9 % IV BOLUS (SEPSIS)
1000.0000 mL | Freq: Once | INTRAVENOUS | Status: AC
  Administered 2013-05-09: 1000 mL via INTRAVENOUS

## 2013-05-09 NOTE — ED Notes (Signed)
Pt states that about two hours ago she has a condition (POTS) if she doesn't have enough fluid on her that she gets syncopal.  Pt c/o dizziness.

## 2013-05-09 NOTE — ED Provider Notes (Signed)
CSN: [id number]     Arrival date & time 05/09/13  1222 History   First MD Initiated Contact with Patient 05/09/13 1300     Chief Complaint  Patient presents with  . Near Syncope  . Dizziness   (Consider location/radiation/quality/duration/timing/severity/associated sxs/prior Treatment) HPI Comments: The patient is a 42 year-old female with a past medical history of Postural Orthostatic Tachycardia Syndrome, presenting the Emergency Department with a chief complaint of near syncope and weakness. She denies LOC or trauma to her head. She reports a gradual onset of weakness and lightheadedness while driving today.  She states she had associated body pain.  She reports her last 2L NS infusion was 4 weeks ago.  She reports only getting 1 L of fluids 2 weeks ago.  No nausea, vomiting, tachycardia, Lower extremity swelling, reports she has depo and has not had a period in 5 years,  abdominal pain, fever or chills.    The history is provided by the patient. No language interpreter was used.    Past Medical History  Diagnosis Date  . Bipolar disorder   . Fibromyalgia     on disability  . Migraine   . Panic attack   . Muscle spasm   . Uterine perforation     hx of  . Syncope   . POTS (postural orthostatic tachycardia syndrome)    Past Surgical History  Procedure Laterality Date  . Dilation and curettage of uterus     Family History  Problem Relation Age of Onset  . Diabetes Mother   . Hypertension Mother   . Cancer Father     brain tumor   History  Substance Use Topics  . Smoking status: Never Smoker   . Smokeless tobacco: Not on file  . Alcohol Use: No   OB History   Grav Para Term Preterm Abortions TAB SAB Ect Mult Living                 Review of Systems  All other systems reviewed and are negative.    Allergies  Azithromycin; Depakote; Lithium; Penicillins; Seroquel; and Sulfa antibiotics  Home Medications   Current Outpatient Rx  Name  Route  Sig  Dispense   Refill  . acetaminophen (TYLENOL) 500 MG tablet   Oral   Take 1,000 mg by mouth every 6 (six) hours as needed for pain.         Marland Kitchen atenolol (TENORMIN) 25 MG tablet   Oral   Take 12.5 mg by mouth daily.         . cholecalciferol (VITAMIN D) 400 UNITS TABS tablet   Oral   Take 2,000 Units by mouth daily.         . clonazePAM (KLONOPIN) 1 MG tablet   Oral   Take 1 mg by mouth 3 (three) times daily as needed for anxiety.         . conjugated estrogens (PREMARIN) vaginal cream   Vaginal   Place 1 Applicatorful vaginally 2 (two) times daily.         . fexofenadine (ALLEGRA) 180 MG tablet   Oral   Take 180 mg by mouth daily.         Marland Kitchen gabapentin (NEURONTIN) 300 MG capsule   Oral   Take 600 mg by mouth 2 (two) times daily.         . medroxyPROGESTERone (DEPO-PROVERA) 150 MG/ML injection   Intramuscular   Inject 150 mg into the muscle every 3 (three) months.         Marland Kitchen  oxyCODONE-acetaminophen (PERCOCET/ROXICET) 5-325 MG per tablet   Oral   Take 1 tablet by mouth every 6 (six) hours as needed for pain.   60 tablet   0   . promethazine (PHENERGAN) 25 MG tablet   Oral   Take 25 mg by mouth every 6 (six) hours as needed for nausea or vomiting.         . ranitidine (ZANTAC) 150 MG tablet   Oral   Take 150 mg by mouth daily.         . SUMAtriptan (IMITREX) 100 MG tablet   Oral   Take 100 mg by mouth daily as needed for migraine.         Marland Kitchen tiZANidine (ZANAFLEX) 4 MG tablet   Oral   Take 4 mg by mouth every 8 (eight) hours as needed (for muscle spasms).          . topiramate (TOPAMAX) 50 MG tablet   Oral   Take 2 tablets (100 mg total) by mouth daily.   60 tablet   3   . traZODone (DESYREL) 100 MG tablet   Oral   Take 200 mg by mouth at bedtime.          Marland Kitchen venlafaxine XR (EFFEXOR-XR) 75 MG 24 hr capsule   Oral   Take 150 mg by mouth every morning.          BP 108/64  Pulse 92  Temp(Src) 97.5 F (36.4 C) (Oral)  Resp 14  SpO2  97% Physical Exam  Nursing note and vitals reviewed. Constitutional: She is oriented to person, place, and time. She appears well-developed and well-nourished. No distress.  HENT:  Head: Normocephalic and atraumatic.  Neck: Neck supple.  Cardiovascular: Normal rate, regular rhythm and normal heart sounds.   Pulmonary/Chest: Effort normal and breath sounds normal. No respiratory distress. She has no wheezes. She has no rales.  Abdominal: Soft. Bowel sounds are normal. She exhibits no distension. There is no tenderness. There is no rebound and no guarding.  Musculoskeletal: She exhibits no edema.  Neurological: She is alert and oriented to person, place, and time.  Skin: Skin is warm. She is not diaphoretic.  Psychiatric: She has a normal mood and affect.    ED Course  Procedures (including critical care time) Labs Review Labs Reviewed  POCT I-STAT, CHEM 8 - Abnormal; Notable for the following:    BUN 5 (*)    All other components within normal limits  CBC  GLUCOSE, CAPILLARY  POCT PREGNANCY, URINE   Imaging Review No results found.  EKG Interpretation    Date/Time:  Wednesday May 09 2013 12:38:06 EST Ventricular Rate:  85 PR Interval:  159 QRS Duration: 74 QT Interval:  382 QTC Calculation: 454 R Axis:   14 Text Interpretation:  Sinus rhythm Probable left atrial enlargement No significant change since last tracing Confirmed by KNAPP  MD-J, JON ([id number]) on 05/09/2013 4:12:41 PM            MDM   1. POTS (postural orthostatic tachycardia syndrome)     The patient with POTS, with multiple episodes of near syncope in the past. She reports she has been unable to get her normal weekly 2L infusions for the past 4 weeks.  Labs order. BP 108/64 EKG-without significant changes from previous. Discussed patient condition with Dr. Lynelle Doctor who reports he has evaluated the patient in the past and reports checking labs and if no abnormality give her 2L NS.   Patient denies  nausea reports minimal relief with pain.  Pain medication given. Discussed lab results, imagine results, and treatment plan with the patient.  She reports understanding and no other concerns at this time.   Patient is stable for discharge at this time.  Meds given in ED:  Medications  ondansetron (ZOFRAN-ODT) disintegrating tablet 8 mg (not administered)  sodium chloride 0.9 % bolus 1,000 mL (0 mLs Intravenous Stopped 05/09/13 1525)  ondansetron (ZOFRAN) injection 4 mg (4 mg Intravenous Given 05/09/13 1441)  oxyCODONE-acetaminophen (PERCOCET/ROXICET) 5-325 MG per tablet 1 tablet (1 tablet Oral Given 05/09/13 1442)  sodium chloride 0.9 % bolus 1,000 mL (1,000 mLs Intravenous New Bag/Given 05/09/13 1524)  oxyCODONE-acetaminophen (PERCOCET/ROXICET) 5-325 MG per tablet 1 tablet (1 tablet Oral Given 05/09/13 1602)    New Prescriptions   No medications on file      Clabe Seal, PA-C 05/09/13 1641

## 2013-05-09 NOTE — Telephone Encounter (Signed)
New message     They did give pt nausea medication-------He said you would understand.

## 2013-05-09 NOTE — ED Notes (Signed)
Pt walking in room and bathroom before discharge. Pt discharged and upon walking out pt reported passing out. Pt stated that she was nauseated and was given zofran and evaluated by Leotis Shames PA twice. Pt did wanted to leave with no further tx. Assisted out to car in wheelchair.

## 2013-05-09 NOTE — Telephone Encounter (Signed)
Patient is currently at [facility], according to husband. Patient went there for IVF. I explained to husband that I did get new orders for her per Dr. Graciela Husbands and I would send them over to short stay for the next time patient needs to receive fluids. I did not send order for Zofran over, as Dr. Graciela Husbands would like patient to ask for it as needed. Patient's husband verbalized understanding and agreeable to plan.

## 2013-05-10 ENCOUNTER — Encounter (HOSPITAL_COMMUNITY): Payer: BC Managed Care – PPO

## 2013-05-10 NOTE — ED Provider Notes (Signed)
Medical screening examination/treatment/procedure(s) were conducted as a shared visit with non-physician practitioner(s) and myself.  I personally evaluated the patient during the encounter.  EKG Interpretation    Date/Time:  Wednesday May 09 2013 12:38:06 EST Ventricular Rate:  85 PR Interval:  159 QRS Duration: 74 QT Interval:  382 QTC Calculation: 454 R Axis:   14 Text Interpretation:  Sinus rhythm Probable left atrial enlargement No significant change since last tracing Confirmed by Tawania Daponte  MD-J, Ingri Diemer ([id number]) on 05/09/2013 4:12:41 PM            She is well known to the emergency room. She presents with recurrent episodes of postural orthostatic tachycardia syndrome. Today in the emergency department the patient was not hypotensive or tachycardic. She requested IV fluids. While here the patient was requesting IV narcotic pain medications for her fibromyalgia chronic condition  Celene Kras, MD 05/10/13 1557

## 2013-05-11 NOTE — Telephone Encounter (Signed)
Patient received IVF at hospital for her POTs - I asked them to keep [place] informed if they needed zofran during this procedure.

## 2013-05-14 ENCOUNTER — Other Ambulatory Visit (HOSPITAL_COMMUNITY): Payer: Self-pay | Admitting: *Deleted

## 2013-05-15 ENCOUNTER — Encounter (HOSPITAL_COMMUNITY)
Admission: RE | Admit: 2013-05-15 | Discharge: 2013-05-15 | Disposition: A | Discharge status: Home / Self Care | Payer: Medicare Other | Source: Ambulatory Visit | Attending: Internal Medicine | Admitting: Internal Medicine

## 2013-05-15 MED ORDER — SODIUM CHLORIDE 0.9 % IV SOLN
Freq: Once | INTRAVENOUS | Status: AC
  Administered 2013-05-15: 09:00:00 via INTRAVENOUS

## 2013-05-15 NOTE — Progress Notes (Signed)
IVF infusion complete. Pt tolerated well.

## 2013-05-21 HISTORY — PX: CATARACT EXTRACTION: SUR2

## 2013-05-21 HISTORY — PX: LASIK: SHX215

## 2013-05-22 ENCOUNTER — Other Ambulatory Visit (HOSPITAL_COMMUNITY): Payer: Self-pay | Admitting: *Deleted

## 2013-05-23 ENCOUNTER — Other Ambulatory Visit (HOSPITAL_COMMUNITY): Payer: Self-pay

## 2013-05-23 ENCOUNTER — Encounter (HOSPITAL_COMMUNITY): Payer: BC Managed Care – PPO

## 2013-05-24 ENCOUNTER — Encounter (HOSPITAL_COMMUNITY)
Admission: RE | Admit: 2013-05-24 | Discharge: 2013-05-24 | Disposition: A | Discharge status: Home / Self Care | Payer: Medicare Other | Source: Ambulatory Visit | Attending: Internal Medicine | Admitting: Internal Medicine

## 2013-05-24 DIAGNOSIS — I951 Orthostatic hypotension: Secondary | ICD-10-CM | POA: Insufficient documentation

## 2013-05-24 DIAGNOSIS — I498 Other specified cardiac arrhythmias: Secondary | ICD-10-CM | POA: Insufficient documentation

## 2013-05-24 MED ORDER — SODIUM CHLORIDE 0.9 % IV SOLN
INTRAVENOUS | Status: DC
  Administered 2013-05-24 (×2): via INTRAVENOUS

## 2013-06-05 ENCOUNTER — Encounter (HOSPITAL_COMMUNITY)
Admission: RE | Admit: 2013-06-05 | Discharge: 2013-06-05 | Disposition: A | Discharge status: Home / Self Care | Payer: Medicare Other | Source: Ambulatory Visit | Attending: Internal Medicine | Admitting: Internal Medicine

## 2013-06-05 ENCOUNTER — Other Ambulatory Visit (HOSPITAL_COMMUNITY): Payer: Self-pay

## 2013-06-05 MED ORDER — SODIUM CHLORIDE 0.9 % IV SOLN
INTRAVENOUS | Status: DC
  Administered 2013-06-05: 11:00:00 via INTRAVENOUS

## 2013-06-12 ENCOUNTER — Encounter (HOSPITAL_COMMUNITY)
Admission: RE | Admit: 2013-06-12 | Discharge: 2013-06-12 | Disposition: A | Discharge status: Home / Self Care | Payer: Medicare Other | Source: Ambulatory Visit | Attending: Internal Medicine | Admitting: Internal Medicine

## 2013-06-12 MED ORDER — SODIUM CHLORIDE 0.9 % IV SOLN
INTRAVENOUS | Status: DC
  Administered 2013-06-12 (×2): 1000 mL via INTRAVENOUS

## 2013-06-19 ENCOUNTER — Encounter (HOSPITAL_COMMUNITY)
Admission: RE | Admit: 2013-06-19 | Discharge: 2013-06-19 | Disposition: A | Discharge status: Home / Self Care | Payer: Medicare Other | Source: Ambulatory Visit | Attending: Internal Medicine | Admitting: Internal Medicine

## 2013-06-19 MED ORDER — ONDANSETRON HCL 4 MG/2ML IJ SOLN
4.0000 mg | Freq: Once | INTRAMUSCULAR | Status: AC
  Administered 2013-06-19: 4 mg via INTRAVENOUS

## 2013-06-19 MED ORDER — ONDANSETRON HCL 4 MG/2ML IJ SOLN
INTRAMUSCULAR | Status: AC
  Filled 2013-06-19: qty 2

## 2013-06-19 MED ORDER — SODIUM CHLORIDE 0.9 % IV SOLN: INTRAVENOUS | Status: DC

## 2013-06-27 ENCOUNTER — Encounter: Payer: Self-pay | Admitting: Family Medicine

## 2013-06-27 ENCOUNTER — Other Ambulatory Visit (HOSPITAL_COMMUNITY): Payer: Self-pay | Admitting: *Deleted

## 2013-06-27 ENCOUNTER — Ambulatory Visit (INDEPENDENT_AMBULATORY_CARE_PROVIDER_SITE_OTHER): Payer: Medicare Other | Admitting: Family Medicine

## 2013-06-27 VITALS — BP 122/68 | HR 96 | Temp 97.8°F | Ht 68.0 in | Wt 172.2 lb

## 2013-06-27 DIAGNOSIS — M8580 Other specified disorders of bone density and structure, unspecified site: Secondary | ICD-10-CM

## 2013-06-27 DIAGNOSIS — Z3042 Encounter for surveillance of injectable contraceptive: Secondary | ICD-10-CM

## 2013-06-27 DIAGNOSIS — R000 Tachycardia, unspecified: Secondary | ICD-10-CM

## 2013-06-27 DIAGNOSIS — IMO0001 Reserved for inherently not codable concepts without codable children: Secondary | ICD-10-CM

## 2013-06-27 DIAGNOSIS — F319 Bipolar disorder, unspecified: Secondary | ICD-10-CM

## 2013-06-27 DIAGNOSIS — Z3049 Encounter for surveillance of other contraceptives: Secondary | ICD-10-CM

## 2013-06-27 DIAGNOSIS — M797 Fibromyalgia: Secondary | ICD-10-CM

## 2013-06-27 DIAGNOSIS — G90A Postural orthostatic tachycardia syndrome (POTS): Secondary | ICD-10-CM

## 2013-06-27 DIAGNOSIS — G43909 Migraine, unspecified, not intractable, without status migrainosus: Secondary | ICD-10-CM

## 2013-06-27 DIAGNOSIS — Z309 Encounter for contraceptive management, unspecified: Secondary | ICD-10-CM

## 2013-06-27 DIAGNOSIS — E559 Vitamin D deficiency, unspecified: Secondary | ICD-10-CM

## 2013-06-27 DIAGNOSIS — I951 Orthostatic hypotension: Secondary | ICD-10-CM

## 2013-06-27 DIAGNOSIS — E785 Hyperlipidemia, unspecified: Secondary | ICD-10-CM

## 2013-06-27 LAB — POCT URINE PREGNANCY: Preg Test, Ur: NEGATIVE

## 2013-06-27 MED ORDER — OXYCODONE-ACETAMINOPHEN 5-325 MG PO TABS: 1.0000 | ORAL_TABLET | Freq: Four times a day (QID) | ORAL | Status: DC | PRN

## 2013-06-27 MED ORDER — MEDROXYPROGESTERONE ACETATE 150 MG/ML IM SUSP
150.0000 mg | Freq: Once | INTRAMUSCULAR | Status: AC
  Administered 2013-06-27: 150 mg via INTRAMUSCULAR

## 2013-06-27 NOTE — Assessment & Plan Note (Signed)
Doing well with infusions.

## 2013-06-27 NOTE — Assessment & Plan Note (Signed)
Continue current dose of supplementation. Recheck Vit D today. Orders Placed This Encounter  Procedures  . Vitamin D, 25-hydroxy  . POCT urine pregnancy  \

## 2013-06-27 NOTE — Progress Notes (Signed)
Subjective:    Patient ID: Melissa [name], female    DOB: [birth year], 43 y.o.   MRN: [id number]  HPI  43 yo pt with complicated medical history here for 3 month follow up.    POTS- saw Dr. Caryl Comes in 02/2013 who was able to set up her infusions at short stay which is wonderful.  Now she no longer needs to drive to [facility] to get her infusions.  Receiving 2 L every week.     Osteopenia- has been on depo provera for over 10 years.  T score -1.6.  She is now on high dose vit D and calcium supplements and weekly fosamax.  Also trying to eat more yogurt and cheeses.  Does not like milk or several other sources of non dairy calcium like salmon, oatmeal, tofu.  Fibromyalgia- followed by pain management.  On Gabapentin, Naltrexone and receives lidocaine infusions.   Still has chronic pain.   Started supplements from Dr. Arlean Hopping rheum handout I gave to her last time.  Vit D deficiency- s/p high dose supplementation, now taking 2000 IU daily.  Last Vit D was 18.      Patient Active Problem List   Diagnosis Date Noted  . Unspecified vitamin D deficiency 04/03/2013  . HLD (hyperlipidemia) 04/03/2013  . Routine general medical examination at a health care facility 04/03/2013  . Osteopenia 04/03/2013  . POTS (postural orthostatic tachycardia syndrome) 02/26/2013  . Syncope 11/29/2012  . Migraine 11/29/2012  . Fibromyalgia   . Bipolar disorder    Past Medical History  Diagnosis Date  . Bipolar disorder   . Fibromyalgia     on disability  . Migraine   . Panic attack   . Muscle spasm   . Uterine perforation     hx of  . Syncope   . POTS (postural orthostatic tachycardia syndrome)    Past Surgical History  Procedure Laterality Date  . Dilation and curettage of uterus    . Cataract extraction  dec 2014  . Lasik  dec 2014   History  Substance Use Topics  . Smoking status: Never Smoker   . Smokeless tobacco: Not on file  . Alcohol Use: No   Family History  Problem Relation Age of  Onset  . Diabetes Mother   . Hypertension Mother   . Cancer Father     brain tumor   Allergies  Allergen Reactions  . Azithromycin     rash  . Depakote [Divalproex Sodium]     Hair loss  . Lithium     Rash  . Penicillins     rash  . Seroquel [Quetiapine Fumarate]     irritable  . Sulfa Antibiotics     Rash   Current Outpatient Prescriptions on File Prior to Visit  Medication Sig Dispense Refill  . acetaminophen (TYLENOL) 500 MG tablet Take 1,000 mg by mouth every 6 (six) hours as needed for pain.      Marland Kitchen atenolol (TENORMIN) 25 MG tablet Take 12.5 mg by mouth daily.      . cholecalciferol (VITAMIN D) 400 UNITS TABS tablet Take 2,000 Units by mouth daily.      . clonazePAM (KLONOPIN) 1 MG tablet Take 1 mg by mouth 3 (three) times daily as needed for anxiety.      . conjugated estrogens (PREMARIN) vaginal cream Place 1 Applicatorful vaginally 2 (two) times daily.      . fexofenadine (ALLEGRA) 180 MG tablet Take 180 mg by mouth daily.      Marland Kitchen  gabapentin (NEURONTIN) 300 MG capsule Take 600 mg by mouth 2 (two) times daily.      . medroxyPROGESTERone (DEPO-PROVERA) 150 MG/ML injection Inject 150 mg into the muscle every 3 (three) months.      Marland Kitchen oxyCODONE-acetaminophen (PERCOCET/ROXICET) 5-325 MG per tablet Take 1 tablet by mouth every 6 (six) hours as needed for pain.  60 tablet  0  . promethazine (PHENERGAN) 25 MG tablet Take 25 mg by mouth every 6 (six) hours as needed for nausea or vomiting.      . ranitidine (ZANTAC) 150 MG tablet Take 150 mg by mouth daily.      . SUMAtriptan (IMITREX) 100 MG tablet Take 100 mg by mouth daily as needed for migraine.      Marland Kitchen tiZANidine (ZANAFLEX) 4 MG tablet Take 4 mg by mouth every 8 (eight) hours as needed (for muscle spasms).       . topiramate (TOPAMAX) 50 MG tablet Take 2 tablets (100 mg total) by mouth daily.  60 tablet  3  . traZODone (DESYREL) 100 MG tablet Take 200 mg by mouth at bedtime.       Marland Kitchen venlafaxine XR (EFFEXOR-XR) 75 MG 24 hr  capsule Take 150 mg by mouth every morning.       No current facility-administered medications on file prior to visit.   The PMH, PSH, Social History, Family History, Medications, and allergies have been reviewed in [facility], and have been updated if relevant.     Review of Systems See HPI +malaise No changes in bowel habits No CP or SOB No Edema No vaginal pain    Objective:   Physical Exam BP 122/68  Pulse 96  Temp(Src) 97.8 F (36.6 C) (Oral)  Ht 5\' 8"  (1.727 m)  Wt 172 lb 4 oz (78.132 kg)  BMI 26.20 kg/m2  SpO2 97%  General:  Well-developed,well-nourished,in no acute distress; alert,appropriate and cooperative throughout examination Head:  normocephalic and atraumatic.   Eyes:  vision grossly intact, pupils equal, pupils round, and pupils reactive to light.   Ears:  R ear normal and L ear normal.   Nose:  no external deformity.   Mouth:  good dentition.   Neck:  No deformities, masses, or tenderness noted. Lungs:  Normal respiratory effort, chest expands symmetrically. Lungs are clear to auscultation, no crackles or wheezes. Heart:  Normal rate and regular rhythm. S1 and S2 normal without gallop, murmur, click, rub or other extra sounds. Abdomen:  Bowel sounds positive,abdomen soft and non-tender without masses, organomegaly or hernias noted. Msk:  No deformity or scoliosis noted of thoracic or lumbar spine.  No TTP over spine, SLR neg bilaterally. Extremities:  No clubbing, cyanosis, edema, or deformity noted with normal full range of motion of all joints.   Neurologic:  alert & oriented X3 and gait normal.   Skin:  Intact without suspicious lesions or rashes Cervical Nodes:  No lymphadenopathy noted Axillary Nodes:  No palpable lymphadenopathy Psych:  Cognition and judgment appear intact. Alert and cooperative with normal attention span and concentration. No apparent delusions, illusions, hallucinations     Assessment & Plan:

## 2013-06-27 NOTE — Assessment & Plan Note (Signed)
Continue Fosamax and caltrate. She will talk to her GYN about coming off of Depo now that her husband had vasectomy. She did have menorrhagia in past so she would like to discuss her options.

## 2013-06-27 NOTE — Patient Instructions (Signed)
Great to see you. I will call you with your Vitamin D results.  Follow up in 3 months.

## 2013-06-27 NOTE — Progress Notes (Signed)
Pre-visit discussion using our clinic review tool. No additional management support is needed unless otherwise documented below in the visit note.  

## 2013-06-28 ENCOUNTER — Encounter: Payer: Self-pay | Admitting: *Deleted

## 2013-06-28 ENCOUNTER — Encounter (HOSPITAL_COMMUNITY)
Admission: RE | Admit: 2013-06-28 | Discharge: 2013-06-28 | Disposition: A | Discharge status: Home / Self Care | Payer: Medicare Other | Source: Ambulatory Visit | Attending: Internal Medicine | Admitting: Internal Medicine

## 2013-06-28 DIAGNOSIS — I951 Orthostatic hypotension: Secondary | ICD-10-CM | POA: Insufficient documentation

## 2013-06-28 DIAGNOSIS — I498 Other specified cardiac arrhythmias: Secondary | ICD-10-CM | POA: Insufficient documentation

## 2013-06-28 LAB — VITAMIN D 25 HYDROXY (VIT D DEFICIENCY, FRACTURES): Vit D, 25-Hydroxy: 38 ng/mL (ref 30–89)

## 2013-06-28 MED ORDER — SODIUM CHLORIDE 0.9 % IV SOLN
INTRAVENOUS | Status: DC
  Administered 2013-06-28: 08:00:00 via INTRAVENOUS

## 2013-06-29 ENCOUNTER — Other Ambulatory Visit: Payer: Self-pay | Admitting: *Deleted

## 2013-07-05 DIAGNOSIS — M545 Low back pain, unspecified: Secondary | ICD-10-CM | POA: Insufficient documentation

## 2013-07-12 ENCOUNTER — Encounter (HOSPITAL_COMMUNITY)
Admission: RE | Admit: 2013-07-12 | Discharge: 2013-07-12 | Disposition: A | Discharge status: Home / Self Care | Payer: Medicare Other | Source: Ambulatory Visit | Attending: Internal Medicine | Admitting: Internal Medicine

## 2013-07-12 MED ORDER — SODIUM CHLORIDE 0.9 % IV SOLN
INTRAVENOUS | Status: DC
  Administered 2013-07-12: 12:00:00 via INTRAVENOUS
  Administered 2013-07-12: 1000 mL via INTRAVENOUS

## 2013-07-19 ENCOUNTER — Inpatient Hospital Stay (HOSPITAL_COMMUNITY): Admission: RE | Admit: 2013-07-19 | Payer: Medicare Other | Source: Ambulatory Visit

## 2013-08-20 ENCOUNTER — Other Ambulatory Visit: Payer: Self-pay

## 2013-08-20 MED ORDER — GABAPENTIN 300 MG PO CAPS: 600.0000 mg | ORAL_CAPSULE | Freq: Two times a day (BID) | ORAL | Status: DC

## 2013-08-20 NOTE — Telephone Encounter (Signed)
Melissa [name] left v/m; [facility] sent refill request last week for gabapentin and has not gotten response; request refill to [facility].Please advise.

## 2013-09-13 ENCOUNTER — Encounter (HOSPITAL_COMMUNITY)
Admission: RE | Admit: 2013-09-13 | Discharge: 2013-09-13 | Disposition: A | Discharge status: Home / Self Care | Payer: Medicare Other | Source: Ambulatory Visit | Attending: Internal Medicine | Admitting: Internal Medicine

## 2013-09-13 DIAGNOSIS — I498 Other specified cardiac arrhythmias: Secondary | ICD-10-CM | POA: Insufficient documentation

## 2013-09-13 DIAGNOSIS — I951 Orthostatic hypotension: Secondary | ICD-10-CM | POA: Insufficient documentation

## 2013-09-13 MED ORDER — SODIUM CHLORIDE 0.9 % IV SOLN
INTRAVENOUS | Status: DC
  Administered 2013-09-13 (×2): 1000 mL/h via INTRAVENOUS

## 2013-09-20 ENCOUNTER — Ambulatory Visit (INDEPENDENT_AMBULATORY_CARE_PROVIDER_SITE_OTHER): Payer: Medicare Other | Admitting: Family Medicine

## 2013-09-20 ENCOUNTER — Encounter: Payer: Self-pay | Admitting: Family Medicine

## 2013-09-20 VITALS — BP 124/70 | HR 99 | Temp 98.1°F | Wt 168.0 lb

## 2013-09-20 DIAGNOSIS — K589 Irritable bowel syndrome without diarrhea: Secondary | ICD-10-CM | POA: Insufficient documentation

## 2013-09-20 DIAGNOSIS — E559 Vitamin D deficiency, unspecified: Secondary | ICD-10-CM

## 2013-09-20 DIAGNOSIS — I951 Orthostatic hypotension: Principal | ICD-10-CM

## 2013-09-20 DIAGNOSIS — Z309 Encounter for contraceptive management, unspecified: Secondary | ICD-10-CM | POA: Insufficient documentation

## 2013-09-20 DIAGNOSIS — G90A Postural orthostatic tachycardia syndrome (POTS): Secondary | ICD-10-CM

## 2013-09-20 DIAGNOSIS — R000 Tachycardia, unspecified: Principal | ICD-10-CM

## 2013-09-20 DIAGNOSIS — I498 Other specified cardiac arrhythmias: Secondary | ICD-10-CM

## 2013-09-20 MED ORDER — MEDROXYPROGESTERONE ACETATE 150 MG/ML IM SUSP
150.0000 mg | Freq: Once | INTRAMUSCULAR | Status: AC
  Administered 2013-09-20: 150 mg via INTRAMUSCULAR

## 2013-09-20 NOTE — Progress Notes (Signed)
Pre visit review using our clinic review tool, if applicable. No additional management support is needed unless otherwise documented below in the visit note. 

## 2013-09-20 NOTE — Assessment & Plan Note (Signed)
Improved.  Continue current dose vit D. Recheck Vit D in 3 months. The patient indicates understanding of these issues and agrees with the plan.

## 2013-09-20 NOTE — Assessment & Plan Note (Signed)
Advised adding probiotic. The patient indicates understanding of these issues and agrees with the plan.

## 2013-09-20 NOTE — Progress Notes (Signed)
Subjective:    Patient ID: Melissa [name], female    DOB: [birth year], 43 y.o.   MRN: [id number]  HPI  43 yo pleasant female with complicated medical history here for 3 month follow up.    Has been under more stress lately- mother in law is staying with her.  Causes her to have lose stools.  POTS- saw Dr. Caryl Comes in 02/2013 who was able to set up her infusions at short stay which is wonderful.  Now she no longer needs to drive to [facility] to get her infusions.  Receiving 2 L every week.   She did miss a few doses when she went to [place] to see her mother.  Last infusion was last week.   Osteopenia- has been on depo provera for over 10 years.  T score -1.6.  She is now on high dose vit D and calcium supplements and weekly fosamax.  Also trying to eat more yogurt and cheeses.  Does not like milk or several other sources of non dairy calcium like salmon, oatmeal, tofu.  Fibromyalgia- followed by pain management.  On Gabapentin, Naltrexone and receives lidocaine infusions.     Vit D deficiency- s/p high dose supplementation, now taking 2000 IU daily.    Vit D 2 months ago normal at 38.    Patient Active Problem List   Diagnosis Date Noted  . Contraception management 09/20/2013  . Unspecified vitamin D deficiency 04/03/2013  . HLD (hyperlipidemia) 04/03/2013  . Osteopenia 04/03/2013  . POTS (postural orthostatic tachycardia syndrome) 02/26/2013  . Syncope 11/29/2012  . Migraine 11/29/2012  . Fibromyalgia   . Bipolar disorder    Past Medical History  Diagnosis Date  . Bipolar disorder   . Fibromyalgia     on disability  . Migraine   . Panic attack   . Muscle spasm   . Uterine perforation     hx of  . Syncope   . POTS (postural orthostatic tachycardia syndrome)    Past Surgical History  Procedure Laterality Date  . Dilation and curettage of uterus    . Cataract extraction  dec 2014  . Lasik  dec 2014   History  Substance Use Topics  . Smoking status: Never Smoker   . Smokeless  tobacco: Not on file  . Alcohol Use: No   Family History  Problem Relation Age of Onset  . Diabetes Mother   . Hypertension Mother   . Cancer Father     brain tumor   Allergies  Allergen Reactions  . Azithromycin     rash  . Depakote [Divalproex Sodium]     Hair loss  . Lithium     Rash  . Penicillins     rash  . Seroquel [Quetiapine Fumarate]     irritable  . Sulfa Antibiotics     Rash   Current Outpatient Prescriptions on File Prior to Visit  Medication Sig Dispense Refill  . acetaminophen (TYLENOL) 500 MG tablet Take 1,000 mg by mouth every 6 (six) hours as needed for pain.      . cholecalciferol (VITAMIN D) 400 UNITS TABS tablet Take 2,000 Units by mouth daily.      . clonazePAM (KLONOPIN) 1 MG tablet Take 1 mg by mouth 3 (three) times daily as needed for anxiety.      . fexofenadine (ALLEGRA) 180 MG tablet Take 180 mg by mouth daily.      Marland Kitchen gabapentin (NEURONTIN) 300 MG capsule Take 2 capsules (600 mg total) by  mouth 2 (two) times daily.  120 capsule  2  . medroxyPROGESTERone (DEPO-PROVERA) 150 MG/ML injection Inject 150 mg into the muscle every 3 (three) months.      Marland Kitchen oxyCODONE-acetaminophen (PERCOCET/ROXICET) 5-325 MG per tablet Take 1 tablet by mouth every 6 (six) hours as needed.  60 tablet  0  . promethazine (PHENERGAN) 25 MG tablet Take 25 mg by mouth every 6 (six) hours as needed for nausea or vomiting.      . ranitidine (ZANTAC) 150 MG tablet Take 150 mg by mouth daily.      . SUMAtriptan (IMITREX) 100 MG tablet Take 100 mg by mouth daily as needed for migraine.      Marland Kitchen tiZANidine (ZANAFLEX) 4 MG tablet Take 4 mg by mouth every 8 (eight) hours as needed (for muscle spasms).       . topiramate (TOPAMAX) 50 MG tablet Take 2 tablets (100 mg total) by mouth daily.  60 tablet  3  . traZODone (DESYREL) 100 MG tablet Take 200 mg by mouth at bedtime.       Marland Kitchen venlafaxine XR (EFFEXOR-XR) 75 MG 24 hr capsule Take 150 mg by mouth every morning.       No current  facility-administered medications on file prior to visit.   The PMH, PSH, Social History, Family History, Medications, and allergies have been reviewed in [facility], and have been updated if relevant.     Review of Systems See HPI +anxiety  No depression No SI or HI + loose stools No abdominal pain No blood in stool No n/v    Objective:   Physical Exam BP 124/70  Pulse 99  Temp(Src) 98.1 F (36.7 C) (Oral)  Wt 168 lb (76.204 kg)  SpO2 98%  General:  Well-developed,well-nourished,in no acute distress; alert,appropriate and cooperative throughout examination Head:  normocephalic and atraumatic.   Eyes:  vision grossly intact, pupils equal, pupils round, and pupils reactive to light.   Ears:  R ear normal and L ear normal.   Nose:  no external deformity.   Mouth:  good dentition.   Neck:  No deformities, masses, or tenderness noted. Lungs:  Normal respiratory effort, chest expands symmetrically. Lungs are clear to auscultation, no crackles or wheezes. Heart:  Normal rate and regular rhythm. S1 and S2 normal without gallop, murmur, click, rub or other extra sounds. Abdomen:  Bowel sounds positive,abdomen soft and non-tender without masses, organomegaly or hernias noted. Msk:  No deformity or scoliosis noted of thoracic or lumbar spine.  No TTP over spine, SLR neg bilaterally. Extremities:  No clubbing, cyanosis, edema, or deformity noted with normal full range of motion of all joints.   Neurologic:  alert & oriented X3 and gait normal.   Skin:  Intact without suspicious lesions or rashes Cervical Nodes:  No lymphadenopathy noted Axillary Nodes:  No palpable lymphadenopathy Psych:  Cognition and judgment appear intact. Alert and cooperative with normal attention span and concentration. No apparent delusions, illusions, hallucinations     Assessment & Plan:

## 2013-09-20 NOTE — Patient Instructions (Addendum)
Please restart your probiotics- Humphrey Rolls is a good product.  Hang in there.  Let me know if I can help.

## 2013-09-20 NOTE — Assessment & Plan Note (Signed)
IM depo today. 

## 2013-09-20 NOTE — Assessment & Plan Note (Signed)
Continue infusions. Advised not skipping them. The patient indicates understanding of these issues and agrees with the plan.

## 2013-09-20 NOTE — Addendum Note (Signed)
Addended by: Modena Nunnery on: 09/20/2013 08:26 AM   Modules accepted: Orders

## 2013-09-24 ENCOUNTER — Telehealth: Payer: Self-pay

## 2013-09-24 NOTE — Telephone Encounter (Signed)
Mr Kuntzman said pt passed out this afternoon 1 -1:30 pm; pt was nauseated and Mr Nielson gave pt Phenergan. Pt complaining of trouble breathing. Pt has POTS. Pt and Mr Phung went to sleep and I woke them up when I called back; pt still has trouble breathing (hard to get her breath) It has been 1 year since pt passed out. Asked if pt could be at office by 4PM but Mr Wussow said no; spoke with RN team lead and she advised to take pt to [facility] now. Mr Sannes will take pt to [facility] now.

## 2013-09-25 ENCOUNTER — Telehealth: Payer: Self-pay | Admitting: Internal Medicine

## 2013-09-25 ENCOUNTER — Other Ambulatory Visit (HOSPITAL_COMMUNITY): Payer: Self-pay | Admitting: *Deleted

## 2013-09-25 ENCOUNTER — Emergency Department (HOSPITAL_COMMUNITY)
Admission: EM | Admit: 2013-09-25 | Discharge: 2013-09-25 | Disposition: A | Discharge status: Home / Self Care | Payer: Medicare Other | Attending: Emergency Medicine | Admitting: Emergency Medicine

## 2013-09-25 ENCOUNTER — Encounter (HOSPITAL_COMMUNITY): Payer: Self-pay | Admitting: Emergency Medicine

## 2013-09-25 DIAGNOSIS — F319 Bipolar disorder, unspecified: Secondary | ICD-10-CM | POA: Insufficient documentation

## 2013-09-25 DIAGNOSIS — E860 Dehydration: Secondary | ICD-10-CM

## 2013-09-25 DIAGNOSIS — Z3202 Encounter for pregnancy test, result negative: Secondary | ICD-10-CM | POA: Insufficient documentation

## 2013-09-25 DIAGNOSIS — Z79899 Other long term (current) drug therapy: Secondary | ICD-10-CM | POA: Insufficient documentation

## 2013-09-25 DIAGNOSIS — Z880 Allergy status to penicillin: Secondary | ICD-10-CM | POA: Insufficient documentation

## 2013-09-25 DIAGNOSIS — Z87828 Personal history of other (healed) physical injury and trauma: Secondary | ICD-10-CM | POA: Insufficient documentation

## 2013-09-25 DIAGNOSIS — R1110 Vomiting, unspecified: Secondary | ICD-10-CM | POA: Insufficient documentation

## 2013-09-25 DIAGNOSIS — Z8679 Personal history of other diseases of the circulatory system: Secondary | ICD-10-CM | POA: Insufficient documentation

## 2013-09-25 DIAGNOSIS — G43909 Migraine, unspecified, not intractable, without status migrainosus: Secondary | ICD-10-CM | POA: Insufficient documentation

## 2013-09-25 DIAGNOSIS — F410 Panic disorder [episodic paroxysmal anxiety] without agoraphobia: Secondary | ICD-10-CM | POA: Insufficient documentation

## 2013-09-25 LAB — COMPREHENSIVE METABOLIC PANEL
ALT: 13 U/L (ref 0–35)
AST: 18 U/L (ref 0–37)
Albumin: 3.9 g/dL (ref 3.5–5.2)
Alkaline Phosphatase: 48 U/L (ref 39–117)
BUN: 12 mg/dL (ref 6–23)
CO2: 19 mEq/L (ref 19–32)
Calcium: 8.7 mg/dL (ref 8.4–10.5)
Chloride: 109 mEq/L (ref 96–112)
Creatinine, Ser: 1.09 mg/dL (ref 0.50–1.10)
GFR calc Af Amer: 70 mL/min — ABNORMAL LOW (ref >90)
GFR calc non Af Amer: 61 mL/min — ABNORMAL LOW (ref >90)
Glucose, Bld: 76 mg/dL (ref 70–99)
Potassium: 4.1 mEq/L (ref 3.7–5.3)
Sodium: 139 mEq/L (ref 137–147)
Total Bilirubin: 0.3 mg/dL (ref 0.3–1.2)
Total Protein: 7 g/dL (ref 6.0–8.3)

## 2013-09-25 LAB — CBC WITH DIFFERENTIAL/PLATELET
Basophils Absolute: 0 10*3/uL (ref 0.0–0.1)
Basophils Relative: 1 % (ref 0–1)
Eosinophils Absolute: 0.1 10*3/uL (ref 0.0–0.7)
Eosinophils Relative: 1 % (ref 0–5)
HCT: 42.8 % (ref 36.0–46.0)
Hemoglobin: 14.3 g/dL (ref 12.0–15.0)
Lymphocytes Relative: 30 % (ref 12–46)
Lymphs Abs: 2.4 10*3/uL (ref 0.7–4.0)
MCH: 28.7 pg (ref 26.0–34.0)
MCHC: 33.4 g/dL (ref 30.0–36.0)
MCV: 85.8 fL (ref 78.0–100.0)
Monocytes Absolute: 0.7 10*3/uL (ref 0.1–1.0)
Monocytes Relative: 9 % (ref 3–12)
Neutro Abs: 4.8 10*3/uL (ref 1.7–7.7)
Neutrophils Relative %: 60 % (ref 43–77)
Platelets: 264 10*3/uL (ref 150–400)
RBC: 4.99 MIL/uL (ref 3.87–5.11)
RDW: 13.8 % (ref 11.5–15.5)
WBC: 8.1 10*3/uL (ref 4.0–10.5)

## 2013-09-25 LAB — URINALYSIS, ROUTINE W REFLEX MICROSCOPIC
Bilirubin Urine: NEGATIVE
Glucose, UA: NEGATIVE mg/dL
Hgb urine dipstick: NEGATIVE
Ketones, ur: NEGATIVE mg/dL
Nitrite: NEGATIVE
Protein, ur: NEGATIVE mg/dL
Specific Gravity, Urine: 1.017 (ref 1.005–1.030)
Urobilinogen, UA: 0.2 mg/dL (ref 0.0–1.0)
pH: 6 (ref 5.0–8.0)

## 2013-09-25 LAB — URINE MICROSCOPIC-ADD ON

## 2013-09-25 LAB — PREGNANCY, URINE: Preg Test, Ur: NEGATIVE

## 2013-09-25 MED ORDER — SODIUM CHLORIDE 0.9 % IV BOLUS (SEPSIS)
1000.0000 mL | Freq: Once | INTRAVENOUS | Status: AC
  Administered 2013-09-25: 1000 mL via INTRAVENOUS

## 2013-09-25 MED ORDER — ONDANSETRON HCL 4 MG/2ML IJ SOLN
4.0000 mg | Freq: Once | INTRAMUSCULAR | Status: AC
  Administered 2013-09-25: 4 mg via INTRAVENOUS
  Filled 2013-09-25: qty 2

## 2013-09-25 MED ORDER — MORPHINE SULFATE 4 MG/ML IJ SOLN
6.0000 mg | Freq: Once | INTRAMUSCULAR | Status: AC
  Administered 2013-09-25: 6 mg via INTRAVENOUS
  Filled 2013-09-25: qty 2

## 2013-09-25 MED ORDER — PROMETHAZINE HCL 25 MG PO TABS: 25.0000 mg | ORAL_TABLET | Freq: Three times a day (TID) | ORAL | Status: DC | PRN

## 2013-09-25 MED ORDER — PROMETHAZINE HCL 25 MG RE SUPP: 25.0000 mg | Freq: Four times a day (QID) | RECTAL | Status: DC | PRN

## 2013-09-25 NOTE — Discharge Instructions (Signed)
Return here as needed.  Followup with your primary care Dr. for recheck.  Slowly increase your fluid intake, rest as much possible

## 2013-09-25 NOTE — Telephone Encounter (Signed)
Orders faxed

## 2013-09-25 NOTE — ED Provider Notes (Signed)
CSN: [id number]     Arrival date & time 09/25/13  1038 History   First MD Initiated Contact with Patient 09/25/13 1125     Chief Complaint  Patient presents with  . Dizziness  . Weakness  . Numbness     (Consider location/radiation/quality/duration/timing/severity/associated sxs/prior Treatment) HPI Patient presents to the emergency department with  weakness, , vomiting and dizzinessPatient, states, that this started yesterday.  She states she has had tingling in the area around her mouth .patient denies chest pain, shortness of breath, headache, blurred vision, back pain, fever, cough, nasal congestion, rash or syncope.  Patient, states she did not take any medications prior to arrival.  States nothing seems make her condition, better or worse. Past Medical History  Diagnosis Date  . Bipolar disorder   . Fibromyalgia     on disability  . Migraine   . Panic attack   . Muscle spasm   . Uterine perforation     hx of  . Syncope   . POTS (postural orthostatic tachycardia syndrome)    Past Surgical History  Procedure Laterality Date  . Dilation and curettage of uterus    . Cataract extraction  dec 2014  . Lasik  dec 2014   Family History  Problem Relation Age of Onset  . Diabetes Mother   . Hypertension Mother   . Cancer Father     brain tumor   History  Substance Use Topics  . Smoking status: Never Smoker   . Smokeless tobacco: Not on file  . Alcohol Use: No   OB History   Grav Para Term Preterm Abortions TAB SAB Ect Mult Living                 Review of Systems   All other systems negative except as documented in the HPI. All pertinent positives and negatives as reviewed in the HPI. Allergies  Azithromycin; Depakote; Lithium; Penicillins; Seroquel; and Sulfa antibiotics  Home Medications   Current Outpatient Rx  Name  Route  Sig  Dispense  Refill  . acetaminophen (TYLENOL) 500 MG tablet   Oral   Take 1,000 mg by mouth every 6 (six) hours as needed for  pain.         . cholecalciferol (VITAMIN D) 400 UNITS TABS tablet   Oral   Take 2,000 Units by mouth daily.         . clonazePAM (KLONOPIN) 1 MG tablet   Oral   Take 1 mg by mouth 3 (three) times daily as needed for anxiety.         . fexofenadine (ALLEGRA) 180 MG tablet   Oral   Take 180 mg by mouth daily.         Marland Kitchen gabapentin (NEURONTIN) 300 MG capsule   Oral   Take 600 mg by mouth 2 (two) times daily.         Marland Kitchen oxyCODONE-acetaminophen (PERCOCET/ROXICET) 5-325 MG per tablet   Oral   Take 1 tablet by mouth every 6 (six) hours as needed for severe pain.         . Probiotic Product (PROBIOTIC DAILY PO)   Oral   Take by mouth. Take 1 tablet by mouth daily         . promethazine (PHENERGAN) 25 MG tablet   Oral   Take 25 mg by mouth every 6 (six) hours as needed for nausea or vomiting.         . ranitidine (ZANTAC) 150 MG tablet  Oral   Take 150 mg by mouth daily.         Marland Kitchen tiZANidine (ZANAFLEX) 4 MG tablet   Oral   Take 4 mg by mouth every 8 (eight) hours as needed (for muscle spasms).          . topiramate (TOPAMAX) 50 MG tablet   Oral   Take 100 mg by mouth daily.         . traZODone (DESYREL) 100 MG tablet   Oral   Take 200 mg by mouth at bedtime.          Marland Kitchen venlafaxine XR (EFFEXOR-XR) 75 MG 24 hr capsule   Oral   Take 150 mg by mouth every morning.         . medroxyPROGESTERone (DEPO-PROVERA) 150 MG/ML injection   Intramuscular   Inject 150 mg into the muscle every 3 (three) months.         . SUMAtriptan (IMITREX) 100 MG tablet   Oral   Take 100 mg by mouth daily as needed for migraine.          BP 115/65  Pulse 88  Temp(Src) 98.1 F (36.7 C) (Oral)  Resp 18  SpO2 99% Physical Exam  Nursing note and vitals reviewed. Constitutional: She is oriented to person, place, and time. She appears well-developed and well-nourished. No distress.  HENT:  Head: Normocephalic and atraumatic.  Mouth/Throat: Mucous membranes are  dry.  Eyes: Pupils are equal, round, and reactive to light.  Neck: Normal range of motion. Neck supple.  Cardiovascular: Normal rate, regular rhythm and normal heart sounds.  Exam reveals no gallop and no friction rub.   No murmur heard. Pulmonary/Chest: Effort normal and breath sounds normal. No respiratory distress.  Abdominal: Soft. Bowel sounds are normal. She exhibits no distension. There is no tenderness. There is no guarding.  Neurological: She is alert and oriented to person, place, and time. She exhibits normal muscle tone. Coordination normal.  Skin: Skin is warm and dry. No rash noted.    ED Course  Procedures (including critical care time) Labs Review Labs Reviewed  COMPREHENSIVE METABOLIC PANEL - Abnormal; Notable for the following:    GFR calc non Af Amer 61 (*)    GFR calc Af Amer 70 (*)    All other components within normal limits  URINALYSIS, ROUTINE W REFLEX MICROSCOPIC - Abnormal; Notable for the following:    Leukocytes, UA MODERATE (*)    All other components within normal limits  URINE CULTURE  CBC WITH DIFFERENTIAL  PREGNANCY, URINE  URINE MICROSCOPIC-ADD ON    Patient has some improvement following IV fluids.  Patient, states, that she has pain when she gets cold with her fibromyalgia.  Patient will be referred back to her primary care Dr. she states she gets IV fluids on a regular basis.  This is fairly consistent with her normal presentation, when she needs fluids.  Patient's mucous membranes were dry on exam  Patient is feeling better following IV fluids.  She is told to follow up with her primary care Dr. told to return here as needed.   Brent General, PA-C 09/25/13 [phone]

## 2013-09-25 NOTE — Telephone Encounter (Signed)
New message     Need orders for normal saline ordered for tomorrow.  Fax order to [phone]

## 2013-09-25 NOTE — ED Notes (Signed)
Pt reports dizziness, weakness, left face numbness since yesterday at 1200. Pt went to [facility], sent here for eval. Stroke screen negative, equal smile, equal hand grips, arm strength. Left leg slightly weaker than right, pt states this is unchanged from baseline, has been weaker for years. Sensation to face she sts is equal, pressure feels the same on each side, just slightly numb and tingling on left. Pt sts she was unable to sleep last night because she couldn't catch her breath. Used husbands albuterol inhaler without relief, shob worse lying flat. C/o nausea as well.

## 2013-09-25 NOTE — ED Notes (Signed)
Pt then reports she passed out yesterday. Spouse sts that is the first time since last summer. Pt has Hx of POTS.

## 2013-09-26 ENCOUNTER — Encounter (HOSPITAL_COMMUNITY): Payer: Medicare Other

## 2013-09-26 LAB — URINE CULTURE: Colony Count: 80000

## 2013-09-28 NOTE — ED Provider Notes (Signed)
Medical screening examination/treatment/procedure(s) were performed by non-physician practitioner and as supervising physician I was immediately available for consultation/collaboration.   EKG Interpretation None        Hoy Morn, MD 09/28/13 [phone]

## 2013-10-03 ENCOUNTER — Emergency Department (HOSPITAL_COMMUNITY)
Admission: EM | Admit: 2013-10-03 | Discharge: 2013-10-03 | Disposition: A | Discharge status: Home / Self Care | Payer: Medicare Other | Attending: Emergency Medicine | Admitting: Emergency Medicine

## 2013-10-03 ENCOUNTER — Encounter (HOSPITAL_COMMUNITY): Payer: Self-pay | Admitting: Emergency Medicine

## 2013-10-03 ENCOUNTER — Other Ambulatory Visit: Payer: Self-pay

## 2013-10-03 ENCOUNTER — Emergency Department (HOSPITAL_COMMUNITY): Payer: Medicare Other

## 2013-10-03 DIAGNOSIS — Z79899 Other long term (current) drug therapy: Secondary | ICD-10-CM | POA: Insufficient documentation

## 2013-10-03 DIAGNOSIS — Z87828 Personal history of other (healed) physical injury and trauma: Secondary | ICD-10-CM | POA: Insufficient documentation

## 2013-10-03 DIAGNOSIS — Z880 Allergy status to penicillin: Secondary | ICD-10-CM | POA: Insufficient documentation

## 2013-10-03 DIAGNOSIS — Z3202 Encounter for pregnancy test, result negative: Secondary | ICD-10-CM | POA: Insufficient documentation

## 2013-10-03 DIAGNOSIS — I951 Orthostatic hypotension: Secondary | ICD-10-CM

## 2013-10-03 DIAGNOSIS — R000 Tachycardia, unspecified: Secondary | ICD-10-CM

## 2013-10-03 DIAGNOSIS — R55 Syncope and collapse: Secondary | ICD-10-CM | POA: Insufficient documentation

## 2013-10-03 DIAGNOSIS — G43909 Migraine, unspecified, not intractable, without status migrainosus: Secondary | ICD-10-CM | POA: Insufficient documentation

## 2013-10-03 DIAGNOSIS — F319 Bipolar disorder, unspecified: Secondary | ICD-10-CM | POA: Insufficient documentation

## 2013-10-03 DIAGNOSIS — I498 Other specified cardiac arrhythmias: Secondary | ICD-10-CM | POA: Insufficient documentation

## 2013-10-03 DIAGNOSIS — F410 Panic disorder [episodic paroxysmal anxiety] without agoraphobia: Secondary | ICD-10-CM | POA: Insufficient documentation

## 2013-10-03 DIAGNOSIS — G90A Postural orthostatic tachycardia syndrome (POTS): Secondary | ICD-10-CM

## 2013-10-03 DIAGNOSIS — R51 Headache: Secondary | ICD-10-CM | POA: Insufficient documentation

## 2013-10-03 LAB — CBC WITH DIFFERENTIAL/PLATELET
BASOS PCT: 1 % (ref 0–1)
Basophils Absolute: 0.1 10*3/uL (ref 0.0–0.1)
EOS ABS: 0.1 10*3/uL (ref 0.0–0.7)
Eosinophils Relative: 1 % (ref 0–5)
HCT: 39.4 % (ref 36.0–46.0)
HEMOGLOBIN: 13.2 g/dL (ref 12.0–15.0)
Lymphocytes Relative: 31 % (ref 12–46)
Lymphs Abs: 2.3 10*3/uL (ref 0.7–4.0)
MCH: 28.7 pg (ref 26.0–34.0)
MCHC: 33.5 g/dL (ref 30.0–36.0)
MCV: 85.7 fL (ref 78.0–100.0)
MONOS PCT: 12 % (ref 3–12)
Monocytes Absolute: 0.9 10*3/uL (ref 0.1–1.0)
Neutro Abs: 4.1 10*3/uL (ref 1.7–7.7)
Neutrophils Relative %: 56 % (ref 43–77)
PLATELETS: 223 10*3/uL (ref 150–400)
RBC: 4.6 MIL/uL (ref 3.87–5.11)
RDW: 13.8 % (ref 11.5–15.5)
WBC: 7.4 10*3/uL (ref 4.0–10.5)

## 2013-10-03 LAB — URINALYSIS, ROUTINE W REFLEX MICROSCOPIC
Bilirubin Urine: NEGATIVE
Glucose, UA: NEGATIVE mg/dL
HGB URINE DIPSTICK: NEGATIVE
Ketones, ur: NEGATIVE mg/dL
Nitrite: NEGATIVE
Protein, ur: NEGATIVE mg/dL
SPECIFIC GRAVITY, URINE: 1.007 (ref 1.005–1.030)
Urobilinogen, UA: 0.2 mg/dL (ref 0.0–1.0)
pH: 7 (ref 5.0–8.0)

## 2013-10-03 LAB — URINE MICROSCOPIC-ADD ON

## 2013-10-03 LAB — I-STAT TROPONIN, ED: Troponin i, poc: 0 ng/mL (ref 0.00–0.08)

## 2013-10-03 LAB — BASIC METABOLIC PANEL
BUN: 9 mg/dL (ref 6–23)
CO2: 19 mEq/L (ref 19–32)
Calcium: 8.5 mg/dL (ref 8.4–10.5)
Chloride: 113 mEq/L — ABNORMAL HIGH (ref 96–112)
Creatinine, Ser: 1.11 mg/dL — ABNORMAL HIGH (ref 0.50–1.10)
GFR, EST AFRICAN AMERICAN: 69 mL/min — AB (ref >90)
GFR, EST NON AFRICAN AMERICAN: 59 mL/min — AB (ref >90)
Glucose, Bld: 84 mg/dL (ref 70–99)
Potassium: 4.5 mEq/L (ref 3.7–5.3)
Sodium: 143 mEq/L (ref 137–147)

## 2013-10-03 LAB — POC URINE PREG, ED: Preg Test, Ur: NEGATIVE

## 2013-10-03 MED ORDER — MORPHINE SULFATE 4 MG/ML IJ SOLN
4.0000 mg | Freq: Once | INTRAMUSCULAR | Status: AC
  Administered 2013-10-03: 4 mg via INTRAVENOUS
  Filled 2013-10-03: qty 1

## 2013-10-03 MED ORDER — HYDROMORPHONE HCL PF 1 MG/ML IJ SOLN
1.0000 mg | Freq: Once | INTRAMUSCULAR | Status: AC
  Administered 2013-10-03: 1 mg via INTRAVENOUS
  Filled 2013-10-03: qty 1

## 2013-10-03 MED ORDER — ONDANSETRON HCL 4 MG/2ML IJ SOLN
4.0000 mg | Freq: Once | INTRAMUSCULAR | Status: AC
  Administered 2013-10-03: 4 mg via INTRAVENOUS
  Filled 2013-10-03: qty 2

## 2013-10-03 MED ORDER — PROCHLORPERAZINE EDISYLATE 5 MG/ML IJ SOLN: 10.0000 mg | Freq: Once | INTRAMUSCULAR | Status: DC

## 2013-10-03 MED ORDER — SODIUM CHLORIDE 0.9 % IV BOLUS (SEPSIS)
1000.0000 mL | Freq: Once | INTRAVENOUS | Status: AC
  Administered 2013-10-03: 1000 mL via INTRAVENOUS

## 2013-10-03 MED ORDER — SUMATRIPTAN SUCCINATE 6 MG/0.5ML ~~LOC~~ SOLN
6.0000 mg | Freq: Once | SUBCUTANEOUS | Status: AC
  Administered 2013-10-03: 6 mg via SUBCUTANEOUS
  Filled 2013-10-03: qty 0.5

## 2013-10-03 MED ORDER — KETOROLAC TROMETHAMINE 30 MG/ML IJ SOLN
30.0000 mg | Freq: Once | INTRAMUSCULAR | Status: AC
  Administered 2013-10-03: 30 mg via INTRAVENOUS
  Filled 2013-10-03: qty 1

## 2013-10-03 NOTE — ED Provider Notes (Signed)
CSN: [id number]     Arrival date & time 10/03/13  1144 History   First MD Initiated Contact with Patient 10/03/13 1202     Chief Complaint  Patient presents with  . Loss of Consciousness     (Consider location/radiation/quality/duration/timing/severity/associated sxs/prior Treatment) HPI Comments: Patient is a 43 year old female with history of POTS, bipolar disorder, fibromyalgia, migraines, syncope who presents today after having 2 syncopal episodes. She reports that she was sitting in the MRI waiting room, waiting for her husband when she began to feel "bad". She states she had a gradually worsening headache which was worse with standing up. She felt as though she needed fresh air and walked outside. She returned to the waiting area and asked to lay down as she felt as though she was going to pass out. She laid down on a bed. When she tried to go from a laying to standing position she synopsized, falling back on to the bed. She was unconscious for a matter of seconds. She then had an additional syncopal episode transitioning from the bed to the wheelchair. She was woken with an ammonia stick and lost consciousness for 4 minutes. She did not hit her head during either of these episodes. This feels similar to her POTS, but she received 2L of [place] on Monday. Generally when she does this is prevents these episodes. No fevers, chills, vomiting, shortness of breath, or chest pain.   The history is provided by the patient. No language interpreter was used.    Past Medical History  Diagnosis Date  . Bipolar disorder   . Fibromyalgia     on disability  . Migraine   . Panic attack   . Muscle spasm   . Uterine perforation     hx of  . Syncope   . POTS (postural orthostatic tachycardia syndrome)    Past Surgical History  Procedure Laterality Date  . Dilation and curettage of uterus    . Cataract extraction  dec 2014  . Lasik  dec 2014   Family History  Problem Relation Age of Onset  .  Diabetes Mother   . Hypertension Mother   . Cancer Father     brain tumor   History  Substance Use Topics  . Smoking status: Never Smoker   . Smokeless tobacco: Not on file  . Alcohol Use: No   OB History   Grav Para Term Preterm Abortions TAB SAB Ect Mult Living                 Review of Systems  Constitutional: Negative for fever and chills.  Eyes: Negative for photophobia and visual disturbance.  Respiratory: Negative for shortness of breath.   Cardiovascular: Negative for chest pain.  Gastrointestinal: Negative for vomiting and abdominal pain.  Neurological: Positive for syncope and headaches.  All other systems reviewed and are negative.     Allergies  Azithromycin; Depakote; Lithium; Penicillins; Seroquel; and Sulfa antibiotics  Home Medications   Prior to Admission medications   Medication Sig Start Date End Date Taking? Authorizing Provider  acetaminophen (TYLENOL) 500 MG tablet Take 1,000 mg by mouth every 6 (six) hours as needed for pain.   Yes Historical Provider, MD  alendronate (FOSAMAX) 70 MG tablet Take 70 mg by mouth once a week. Take with a full glass of water on an empty stomach. On saturdays   Yes Historical Provider, MD  cholecalciferol (VITAMIN D) 400 UNITS TABS tablet Take 3,000 Units by mouth daily.  Yes Historical Provider, MD  clonazePAM (KLONOPIN) 1 MG tablet Take 1 mg by mouth 3 (three) times daily as needed for anxiety. 04/03/13  Yes Lucille Passy, MD  fexofenadine (ALLEGRA) 180 MG tablet Take 180 mg by mouth daily.   Yes Historical Provider, MD  gabapentin (NEURONTIN) 300 MG capsule Take 600 mg by mouth 2 (two) times daily.   Yes Historical Provider, MD  medroxyPROGESTERone (DEPO-PROVERA) 150 MG/ML injection Inject 150 mg into the muscle every 3 (three) months.   Yes Historical Provider, MD  oxyCODONE-acetaminophen (PERCOCET/ROXICET) 5-325 MG per tablet Take 1 tablet by mouth every 6 (six) hours as needed for severe pain.   Yes Historical  Provider, MD  Probiotic Product (PROBIOTIC DAILY PO) Take 1 tablet by mouth daily.    Yes Historical Provider, MD  promethazine (PHENERGAN) 25 MG tablet Take 1 tablet (25 mg total) by mouth every 8 (eight) hours as needed for nausea or vomiting. 09/25/13  Yes Resa Miner Lawyer, PA-C  ranitidine (ZANTAC) 150 MG tablet Take 150 mg by mouth daily.   Yes Historical Provider, MD  SUMAtriptan (IMITREX) 100 MG tablet Take 100 mg by mouth daily as needed for migraine.   Yes Historical Provider, MD  tiZANidine (ZANAFLEX) 4 MG tablet Take 4 mg by mouth every 8 (eight) hours as needed (for muscle spasms).    Yes Historical Provider, MD  topiramate (TOPAMAX) 50 MG tablet Take 100 mg by mouth daily.   Yes Historical Provider, MD  traZODone (DESYREL) 100 MG tablet Take 200 mg by mouth at bedtime.    Yes Historical Provider, MD  venlafaxine XR (EFFEXOR-XR) 75 MG 24 hr capsule Take 150 mg by mouth every morning.   Yes Historical Provider, MD   BP 104/63  Pulse 80  Temp(Src) 97.7 F (36.5 C) (Oral)  Resp 19  SpO2 96% Physical Exam  Nursing note and vitals reviewed. Constitutional: She is oriented to person, place, and time. She appears well-developed and well-nourished. No distress.  Laying comfortably flat on bed  HENT:  Head: Normocephalic and atraumatic.  Right Ear: External ear normal.  Left Ear: External ear normal.  Nose: Nose normal.  Mouth/Throat: Oropharynx is clear and moist.  Eyes: Conjunctivae and EOM are normal. Pupils are equal, round, and reactive to light.  Neck: Normal range of motion.  No nuchal rigidity or meningeal signs  Cardiovascular: Normal rate, regular rhythm, normal heart sounds, intact distal pulses and normal pulses.   Pulmonary/Chest: Effort normal and breath sounds normal. No stridor. No respiratory distress. She has no wheezes. She has no rales.  Abdominal: Soft. She exhibits no distension. There is no tenderness.  Musculoskeletal: Normal range of motion.   Neurological: She is alert and oriented to person, place, and time. She has normal strength. No sensory deficit.  Finger nose finger normal, rapid alternating movements normal.  Grip strength 5/5 Strength 5/5 in all extremities.   Skin: Skin is warm and dry. She is not diaphoretic. No erythema.  Psychiatric: She has a normal mood and affect. Her behavior is normal.    ED Course  Procedures (including critical care time) Labs Review Labs Reviewed  BASIC METABOLIC PANEL - Abnormal; Notable for the following:    Chloride 113 (*)    Creatinine, Ser 1.11 (*)    GFR calc non Af Amer 59 (*)    GFR calc Af Amer 69 (*)    All other components within normal limits  URINALYSIS, ROUTINE W REFLEX MICROSCOPIC - Abnormal; Notable for the  following:    Leukocytes, UA MODERATE (*)    All other components within normal limits  URINE MICROSCOPIC-ADD ON - Abnormal; Notable for the following:    Squamous Epithelial / LPF MANY (*)    All other components within normal limits  URINE CULTURE  CBC WITH DIFFERENTIAL  I-STAT TROPOININ, ED  POC URINE PREG, ED    Imaging Review Ct Head Wo Contrast  10/03/2013   CLINICAL DATA:  Severe headache; syncope  EXAM: CT HEAD WITHOUT CONTRAST  TECHNIQUE: Contiguous axial images were obtained from the base of the skull through the vertex without intravenous contrast. Study was obtained within 24 hr of patient's arrival at the emergency department.  COMPARISON:  September 18, 2012  FINDINGS: The ventricles are normal in size and configuration. Slight frontal atrophy bilaterally is stable. There is no mass, hemorrhage, extra-axial fluid collection, or midline shift. Gray-white compartments appear normal. There is no demonstrable acute infarct. Bony calvarium appears intact. Mastoid air cells are clear.  IMPRESSION: Slight frontal atrophy bilaterally. No intracranial mass, hemorrhage, or acute infarct.   Electronically Signed   By: Lowella Grip M.D.   On: 10/03/2013 15:55      EKG Interpretation None      MDM   Final diagnoses:  Syncope and collapse  POTS (postural orthostatic tachycardia syndrome)    Patient presents to ED after 2 episodes of syncope. Hx of POTS disease. Labs, EKG, and head CT are unremarkable. Likely etiology of syncope related to POTS disease. Patient does not feel improved after morphine, Toradol, and Imitrex. Will given aggressive IVF and reassess. Patient signed out to [place], PA-C at change of shift. She will reassess, likely dispo home. Discussed case with Dr. Reather Converse who agrees with plan. Patient is hemodynamically stable. Patient / Family / Caregiver informed of clinical course, understand medical decision-making process, and agree with plan.    Elwyn Lade, PA-C 10/04/13 1006

## 2013-10-03 NOTE — ED Notes (Signed)
Initial Contact - pt resting on stretcher, reports hx of syncope secondary to POTS, reports rec'd 2L [place] x2 days ago "and it's strange that i passed out after that".  Pt reports c/o 8/10 diffuse HA onset approx 1/2hr prior to event.  Pt reports hx of migranes "but this isn't the same".  Pt reports HA worse with position changes.  Pt denies CP/SOB, n/v or other complaints.  Skin PWD.  A+Ox4.  Neuros grossly intact.  NAD.

## 2013-10-03 NOTE — ED Notes (Signed)
EDPA notified, pt continues to c/o 9/10 HA.  Awaiting orders.

## 2013-10-03 NOTE — ED Notes (Signed)
Bed: WA20 Expected date:  Expected time:  Means of arrival:  Comments: EMS- Syncope x 2

## 2013-10-03 NOTE — ED Provider Notes (Signed)
Pt received in sign out from PA Merrell at shift change.  43 y.o. F with hx of POTS disease, bipolar d/o, fibromyalgia, migraines, recurrent syncope, presenting to the ED for 2 syncopal episodes today.  States she had a worsening headache, went to sit down and syncopized onto a bed.  Had another syncopal episode while getting into wheelchair.  CT head obtained which was normal.  Labs are reassuring.  Neuro exam is non-focal.  Plan:  IVFB given, will re-check and likely discharge home.  Results for orders placed during the hospital encounter of 10/03/13  CBC WITH DIFFERENTIAL      Result Value Ref Range   WBC 7.4  4.0 - 10.5 K/uL   RBC 4.60  3.87 - 5.11 MIL/uL   Hemoglobin 13.2  12.0 - 15.0 g/dL   HCT 39.4  36.0 - 46.0 %   MCV 85.7  78.0 - 100.0 fL   MCH 28.7  26.0 - 34.0 pg   MCHC 33.5  30.0 - 36.0 g/dL   RDW 13.8  11.5 - 15.5 %   Platelets 223  150 - 400 K/uL   Neutrophils Relative % 56  43 - 77 %   Neutro Abs 4.1  1.7 - 7.7 K/uL   Lymphocytes Relative 31  12 - 46 %   Lymphs Abs 2.3  0.7 - 4.0 K/uL   Monocytes Relative 12  3 - 12 %   Monocytes Absolute 0.9  0.1 - 1.0 K/uL   Eosinophils Relative 1  0 - 5 %   Eosinophils Absolute 0.1  0.0 - 0.7 K/uL   Basophils Relative 1  0 - 1 %   Basophils Absolute 0.1  0.0 - 0.1 K/uL  BASIC METABOLIC PANEL      Result Value Ref Range   Sodium 143  137 - 147 mEq/L   Potassium 4.5  3.7 - 5.3 mEq/L   Chloride 113 (*) 96 - 112 mEq/L   CO2 19  19 - 32 mEq/L   Glucose, Bld 84  70 - 99 mg/dL   BUN 9  6 - 23 mg/dL   Creatinine, Ser 1.11 (*) 0.50 - 1.10 mg/dL   Calcium 8.5  8.4 - 10.5 mg/dL   GFR calc non Af Amer 59 (*) >90 mL/min   GFR calc Af Amer 69 (*) >90 mL/min  URINALYSIS, ROUTINE W REFLEX MICROSCOPIC      Result Value Ref Range   Color, Urine YELLOW  YELLOW   APPearance CLEAR  CLEAR   Specific Gravity, Urine 1.007  1.005 - 1.030   pH 7.0  5.0 - 8.0   Glucose, UA NEGATIVE  NEGATIVE mg/dL   Hgb urine dipstick NEGATIVE  NEGATIVE   Bilirubin Urine NEGATIVE  NEGATIVE   Ketones, ur NEGATIVE  NEGATIVE mg/dL   Protein, ur NEGATIVE  NEGATIVE mg/dL   Urobilinogen, UA 0.2  0.0 - 1.0 mg/dL   Nitrite NEGATIVE  NEGATIVE   Leukocytes, UA MODERATE (*) NEGATIVE  URINE MICROSCOPIC-ADD ON      Result Value Ref Range   Squamous Epithelial / LPF MANY (*) RARE   WBC, UA 7-10  <3 WBC/hpf   Bacteria, UA RARE  RARE  I-STAT TROPOININ, ED      Result Value Ref Range   Troponin i, poc 0.00  0.00 - 0.08 ng/mL   Comment 3           POC URINE PREG, ED      Result Value Ref Range   Preg  Test, Ur NEGATIVE  NEGATIVE   Ct Head Wo Contrast  10/03/2013   CLINICAL DATA:  Severe headache; syncope  EXAM: CT HEAD WITHOUT CONTRAST  TECHNIQUE: Contiguous axial images were obtained from the base of the skull through the vertex without intravenous contrast. Study was obtained within 24 hr of patient's arrival at the emergency department.  COMPARISON:  September 18, 2012  FINDINGS: The ventricles are normal in size and configuration. Slight frontal atrophy bilaterally is stable. There is no mass, hemorrhage, extra-axial fluid collection, or midline shift. Gray-white compartments appear normal. There is no demonstrable acute infarct. Bony calvarium appears intact. Mastoid air cells are clear.  IMPRESSION: Slight frontal atrophy bilaterally. No intracranial mass, hemorrhage, or acute infarct.   Electronically Signed   By: Lowella Grip M.D.   On: 10/03/2013 15:55   Fluids have finished infusing, patient given additional dose of dilaudid for her ongoing headache with some improvement. Neuro exam remains non-focal, VS stable.  She states now she feels sleepy and is comfortable going home to take home meds and rest.  She will FU with her PCP within 1 week for re-check and to discuss this ED visit.  Discussed plan with pt and husband, she acknowdedged understanding and agreed with plan of care.  Larene Pickett, PA-C 10/03/13 1921

## 2013-10-03 NOTE — Discharge Instructions (Signed)
Continue all home meds as directed. Follow up with your primary care physician within 1 week for re-check and to discuss this ED visit. Return to the ED for new concerns.

## 2013-10-03 NOTE — ED Notes (Signed)
PER EMS - pt with syncope x2 this AM, no fall, pt c/o HA prior to events.  Pt with POTS and normally receives 2L [place]/week, syncope is not abnormal per pt.  Pt reports HA is new.  Stroke scale negative per EMS.  A+Ox4.  No complaints CP or other complaints.  153ml NS given.

## 2013-10-04 ENCOUNTER — Telehealth: Payer: Self-pay | Admitting: Family Medicine

## 2013-10-04 ENCOUNTER — Emergency Department (HOSPITAL_COMMUNITY)
Admission: EM | Admit: 2013-10-04 | Discharge: 2013-10-04 | Disposition: A | Discharge status: Home / Self Care | Payer: Medicare Other | Attending: Emergency Medicine | Admitting: Emergency Medicine

## 2013-10-04 ENCOUNTER — Encounter (HOSPITAL_COMMUNITY): Payer: Self-pay | Admitting: Emergency Medicine

## 2013-10-04 DIAGNOSIS — G8929 Other chronic pain: Secondary | ICD-10-CM | POA: Insufficient documentation

## 2013-10-04 DIAGNOSIS — IMO0001 Reserved for inherently not codable concepts without codable children: Secondary | ICD-10-CM | POA: Insufficient documentation

## 2013-10-04 DIAGNOSIS — F410 Panic disorder [episodic paroxysmal anxiety] without agoraphobia: Secondary | ICD-10-CM | POA: Insufficient documentation

## 2013-10-04 DIAGNOSIS — R7989 Other specified abnormal findings of blood chemistry: Secondary | ICD-10-CM | POA: Insufficient documentation

## 2013-10-04 DIAGNOSIS — R3915 Urgency of urination: Secondary | ICD-10-CM | POA: Insufficient documentation

## 2013-10-04 DIAGNOSIS — R51 Headache: Secondary | ICD-10-CM

## 2013-10-04 DIAGNOSIS — Z79899 Other long term (current) drug therapy: Secondary | ICD-10-CM | POA: Insufficient documentation

## 2013-10-04 DIAGNOSIS — G43909 Migraine, unspecified, not intractable, without status migrainosus: Secondary | ICD-10-CM | POA: Insufficient documentation

## 2013-10-04 DIAGNOSIS — R300 Dysuria: Secondary | ICD-10-CM | POA: Insufficient documentation

## 2013-10-04 DIAGNOSIS — R42 Dizziness and giddiness: Secondary | ICD-10-CM | POA: Insufficient documentation

## 2013-10-04 DIAGNOSIS — R5381 Other malaise: Secondary | ICD-10-CM | POA: Insufficient documentation

## 2013-10-04 DIAGNOSIS — R519 Headache, unspecified: Secondary | ICD-10-CM

## 2013-10-04 DIAGNOSIS — M549 Dorsalgia, unspecified: Secondary | ICD-10-CM | POA: Insufficient documentation

## 2013-10-04 DIAGNOSIS — Z8679 Personal history of other diseases of the circulatory system: Secondary | ICD-10-CM | POA: Insufficient documentation

## 2013-10-04 DIAGNOSIS — F319 Bipolar disorder, unspecified: Secondary | ICD-10-CM | POA: Insufficient documentation

## 2013-10-04 DIAGNOSIS — R110 Nausea: Secondary | ICD-10-CM | POA: Insufficient documentation

## 2013-10-04 DIAGNOSIS — Z880 Allergy status to penicillin: Secondary | ICD-10-CM | POA: Insufficient documentation

## 2013-10-04 DIAGNOSIS — R5383 Other fatigue: Secondary | ICD-10-CM

## 2013-10-04 DIAGNOSIS — R269 Unspecified abnormalities of gait and mobility: Secondary | ICD-10-CM | POA: Insufficient documentation

## 2013-10-04 LAB — URINE CULTURE: Colony Count: 50000

## 2013-10-04 LAB — URINALYSIS, ROUTINE W REFLEX MICROSCOPIC
Bilirubin Urine: NEGATIVE
Glucose, UA: NEGATIVE mg/dL
Hgb urine dipstick: NEGATIVE
Ketones, ur: NEGATIVE mg/dL
Nitrite: NEGATIVE
Protein, ur: NEGATIVE mg/dL
Specific Gravity, Urine: 1.005 (ref 1.005–1.030)
Urobilinogen, UA: 0.2 mg/dL (ref 0.0–1.0)
pH: 6.5 (ref 5.0–8.0)

## 2013-10-04 LAB — URINE MICROSCOPIC-ADD ON

## 2013-10-04 MED ORDER — KETOROLAC TROMETHAMINE 30 MG/ML IJ SOLN
30.0000 mg | Freq: Once | INTRAMUSCULAR | Status: AC
  Administered 2013-10-04: 30 mg via INTRAVENOUS
  Filled 2013-10-04: qty 1

## 2013-10-04 MED ORDER — ASPIRIN 81 MG PO CHEW
324.0000 mg | CHEWABLE_TABLET | Freq: Once | ORAL | Status: AC
  Administered 2013-10-04: 324 mg via ORAL
  Filled 2013-10-04: qty 4

## 2013-10-04 MED ORDER — HYDROMORPHONE HCL PF 1 MG/ML IJ SOLN
1.0000 mg | Freq: Once | INTRAMUSCULAR | Status: AC
  Administered 2013-10-04: 1 mg via INTRAVENOUS
  Filled 2013-10-04: qty 1

## 2013-10-04 MED ORDER — METOCLOPRAMIDE HCL 5 MG/ML IJ SOLN
10.0000 mg | Freq: Once | INTRAMUSCULAR | Status: AC
  Administered 2013-10-04: 10 mg via INTRAVENOUS
  Filled 2013-10-04: qty 2

## 2013-10-04 MED ORDER — SODIUM CHLORIDE 0.9 % IV BOLUS (SEPSIS)
1000.0000 mL | INTRAVENOUS | Status: AC
  Administered 2013-10-04: 1000 mL via INTRAVENOUS

## 2013-10-04 MED ORDER — DIPHENHYDRAMINE HCL 50 MG/ML IJ SOLN
25.0000 mg | Freq: Once | INTRAMUSCULAR | Status: AC
  Administered 2013-10-04: 25 mg via INTRAVENOUS
  Filled 2013-10-04: qty 1

## 2013-10-04 NOTE — ED Provider Notes (Signed)
Patient received in handoff . Intractable migraine headaches and hx of POTS. Fluids, and multiple meds for pain relief given. CT negative. No focal neuro deficits on exam. I spoke with Dr. Leonel Ramsay who recommends depakote, however patient is allergic. Patient  Seen 3 days ago with negative work up at that time.  Pt HA treated without improvement. HA and non concerning for [facility], ICH, Meningitis, or temporal arteritis. Pt is afebrile with no focal neuro deficits, nuchal rigidity, or change in vision. Pt is to follow up with PCP to discuss medication. Pt verbalizes understanding and is agreeable with plan to dc.      Margarita Mail, PA-C 10/08/13 2219

## 2013-10-04 NOTE — ED Notes (Signed)
Pt was here yesterday, c/o migraine today.

## 2013-10-04 NOTE — Discharge Instructions (Signed)
You are having a headache. No specific cause was found today for your headache. It may have been a migraine or other cause of headache. Stress, anxiety, fatigue, and depression are common triggers for headaches. Your headache today does not appear to be life-threatening or require hospitalization, but often the exact cause of headaches is not determined in the emergency department. Therefore, follow-up with your doctor is very important to find out what may have caused your headache, and whether or not you need any further diagnostic testing or treatment. Sometimes headaches can appear benign (not harmful), but then more serious symptoms can develop which should prompt an immediate re-evaluation by your doctor or the emergency department. SEEK MEDICAL ATTENTION IF: You develop possible problems with medications prescribed.  The medications don't resolve your headache, if it recurs , or if you have multiple episodes of vomiting or can't take fluids. You have a change from the usual headache. RETURN IMMEDIATELY IF you develop a sudden, severe headache or confusion, become poorly responsive or faint, develop a fever above 100.19F or problem breathing, have a change in speech, vision, swallowing, or understanding, or develop new weakness, numbness, tingling, incoordination, or have a seizure.  Please have your primary care doctor recheck your creatinine in the next 48 hours.

## 2013-10-04 NOTE — Telephone Encounter (Signed)
Patient Information:  Caller Name: Lynnlee  Phone: [phone]  Patient: Melissa [name], Melissa [name]  Gender: Female  DOB: [birth year]  Age: 43 Years  PCP: Arnette Norris [facility])  Pregnant: No  Office Follow Up:  Does the office need to follow up with this patient?: No  Instructions For The Office: N/A  RN Note:  On Depoprovera/no menses in years.  Reports sudden onset (within 1 minute) of severe right sided headache while waiting for huband at [place]. After sudden headache, felt weak, and "strange all over" so staff had her lie down.  Taken by 911 to [facility] ED due to syncope twice when tried to get up at [facility].  No head injury.  CT scan negative. Blood sugar was 93.  Treated with IV fluids.  Was advised to follow up with PCP within 1 week.  Current head pain rated 8/10 and severely dizzy. Requires assistance to walk.  Reports numbness around mouth that was present during evaluation in ED. Reports chest discomfort present now (and at ED visit) described as "more effort to breath." Can take a deep breath but it makes head pain worse.  Headache also worsens when stand up or sits; slightly better when lies down. Currently a passenger in car coming back from appointment with psychiatrist.  Symptoms  Reason For Call & Symptoms: Headache for 24 hours. Head pain is on right side of head.  Reviewed Health History In EMR: Yes  Reviewed Medications In EMR: Yes  Reviewed Allergies In EMR: Yes  Reviewed Surgeries / Procedures: Yes  Date of Onset of Symptoms: 10/03/2013  Treatments Tried: Percocet, Tylenol, Topomax, Imitrex, IV fluids  Treatments Tried Worked: No OB / GYN:  LMP: Unknown  Guideline(s) Used:  Headache  Disposition Per Guideline:   Go to ED Now (or to Office with PCP Approval)  Reason For Disposition Reached:   Patient sounds very sick or weak to the triager  Advice Given:  Rest:   Lie down in a dark, quiet place and try to relax. Close your eyes and  imagine your entire body relaxing.  Call Back If:  Headache lasts longer than 24 hours  You become worse.  RN Overrode Recommendation:  Go To ED  Disposition discussed with office staff who spoke with Dr Deborra Medina; ordered to return to ED now.

## 2013-10-04 NOTE — ED Provider Notes (Signed)
Medical screening examination/treatment/procedure(s) were performed by non-physician practitioner and as supervising physician I was immediately available for consultation/collaboration.   EKG Interpretation None        Mariea Clonts, MD 10/04/13 1556

## 2013-10-04 NOTE — ED Provider Notes (Signed)
CSN: [id number]     Arrival date & time 10/04/13  1311 History   First MD Initiated Contact with Patient 10/04/13 1333     Chief Complaint  Patient presents with  . Headache     (Consider location/radiation/quality/duration/timing/severity/associated sxs/prior Treatment) HPI Pt is a 43yo female with hx of bipolar disorder, fibromyalgia, migraine, panic attack, syncope and POTS presenting to ED c/o gradually worsening headache that started yesterday.  Pt was evaluated in ED yesterday, 4/15 for reports of multiple syncopal episodes.  Pt states yesterday they were not able to determine cause of her headache in her lab work or head CT.  Pt states her headache was improved while in ED yesterday because she was "doped up" on medication.  Pt states she was able to get about 4 hours of sleep last night and when she woke up, her headache had improved, however, as she attempted to do different things today, her headache worsened. Pt states this morning she did a load of laundry, then road in a car to [facility] for her psychiatrist. On the way, they stopped in a store.  Pt states she car ride as well as walking around the store made her headache worse, causing her to return to the ED today.  Pt also reports associated nausea, dizziness, and difficulty urinating. Pt states she does have back pain but that is chronic.  Today's headache is different from previous migraines as today she does not have photophobia and pain is worse with head movements. Denies falls or additional syncopal episodes. Denies fever, vomiting or diarrhea.   Past Medical History  Diagnosis Date  . Bipolar disorder   . Fibromyalgia     on disability  . Migraine   . Panic attack   . Muscle spasm   . Uterine perforation     hx of  . Syncope   . POTS (postural orthostatic tachycardia syndrome)    Past Surgical History  Procedure Laterality Date  . Dilation and curettage of uterus    . Cataract extraction  dec 2014  . Lasik  dec 2014    Family History  Problem Relation Age of Onset  . Diabetes Mother   . Hypertension Mother   . Cancer Father     brain tumor   History  Substance Use Topics  . Smoking status: Never Smoker   . Smokeless tobacco: Not on file  . Alcohol Use: No   OB History   Grav Para Term Preterm Abortions TAB SAB Ect Mult Living                 Review of Systems  Constitutional: Positive for fatigue. Negative for fever and chills.  Respiratory: Negative for cough, chest tightness and shortness of breath.   Cardiovascular: Negative for chest pain and palpitations.  Gastrointestinal: Positive for nausea. Negative for vomiting, abdominal pain, diarrhea and constipation.  Genitourinary: Positive for dysuria and urgency. Negative for frequency, hematuria, flank pain, vaginal bleeding, vaginal discharge, vaginal pain and pelvic pain.  Musculoskeletal: Positive for back pain ( chronic). Negative for myalgias, neck pain and neck stiffness.  Neurological: Positive for dizziness, weakness and headaches. Negative for seizures, syncope, speech difficulty, light-headedness and numbness.  All other systems reviewed and are negative.     Allergies  Azithromycin; Depakote; Lithium; Penicillins; Seroquel; and Sulfa antibiotics  Home Medications   Prior to Admission medications   Medication Sig Start Date End Date Taking? Authorizing Provider  acetaminophen (TYLENOL) 500 MG tablet Take 1,000 mg by  mouth every 6 (six) hours as needed for pain.   Yes Historical Provider, MD  alendronate (FOSAMAX) 70 MG tablet Take 70 mg by mouth once a week. Take with a full glass of water on an empty stomach. On saturdays   Yes Historical Provider, MD  cholecalciferol (VITAMIN D) 400 UNITS TABS tablet Take 3,000 Units by mouth daily.    Yes Historical Provider, MD  clonazePAM (KLONOPIN) 1 MG tablet Take 1 mg by mouth 3 (three) times daily as needed for anxiety. 04/03/13  Yes Lucille Passy, MD  fexofenadine (ALLEGRA) 180 MG  tablet Take 180 mg by mouth daily.   Yes Historical Provider, MD  gabapentin (NEURONTIN) 300 MG capsule Take 600 mg by mouth 2 (two) times daily.   Yes Historical Provider, MD  medroxyPROGESTERone (DEPO-PROVERA) 150 MG/ML injection Inject 150 mg into the muscle every 3 (three) months.   Yes Historical Provider, MD  oxyCODONE-acetaminophen (PERCOCET/ROXICET) 5-325 MG per tablet Take 1 tablet by mouth every 6 (six) hours as needed for severe pain.   Yes Historical Provider, MD  Probiotic Product (PROBIOTIC DAILY PO) Take 1 tablet by mouth daily.    Yes Historical Provider, MD  promethazine (PHENERGAN) 25 MG tablet Take 1 tablet (25 mg total) by mouth every 8 (eight) hours as needed for nausea or vomiting. 09/25/13  Yes Resa Miner Lawyer, PA-C  ranitidine (ZANTAC) 150 MG tablet Take 150 mg by mouth daily.   Yes Historical Provider, MD  SUMAtriptan (IMITREX) 100 MG tablet Take 100 mg by mouth daily as needed for migraine.   Yes Historical Provider, MD  tiZANidine (ZANAFLEX) 4 MG tablet Take 4 mg by mouth every 8 (eight) hours as needed (for muscle spasms).    Yes Historical Provider, MD  topiramate (TOPAMAX) 50 MG tablet Take 100 mg by mouth daily.   Yes Historical Provider, MD  traZODone (DESYREL) 100 MG tablet Take 200 mg by mouth at bedtime.    Yes Historical Provider, MD  venlafaxine XR (EFFEXOR-XR) 75 MG 24 hr capsule Take 150 mg by mouth every morning.   Yes Historical Provider, MD   BP 104/64  Pulse 64  Temp(Src) 98.4 F (36.9 C) (Oral)  Resp 18  SpO2 99% Physical Exam  Nursing note and vitals reviewed. Constitutional: She is oriented to person, place, and time. She appears well-developed and well-nourished. No distress.  Pt lying flat on exam bed, lights turned on in exam room, NAD.  HENT:  Head: Normocephalic and atraumatic.  Eyes: Conjunctivae and EOM are normal. Pupils are equal, round, and reactive to light. No scleral icterus.  Neck: Normal range of motion. Neck supple.  No  nuchal rigidity or meningeal signs.  Cardiovascular: Normal rate, regular rhythm and normal heart sounds.   Pulmonary/Chest: Effort normal and breath sounds normal. No respiratory distress. She has no wheezes. She has no rales. She exhibits no tenderness.  Abdominal: Soft. Bowel sounds are normal. She exhibits no distension and no mass. There is no tenderness. There is no rebound and no guarding.  Musculoskeletal: Normal range of motion.  Neurological: She is alert and oriented to person, place, and time. She has normal strength. No cranial nerve deficit or sensory deficit. Gait abnormal. Coordination normal. GCS eye subscore is 4. GCS verbal subscore is 5. GCS motor subscore is 6.  Headache and lightheadedness worsen with change in position. Pt falls backward with romberg test (chronic per pt and husband)  Slow steady gait.   Skin: Skin is warm and dry. She  is not diaphoretic.    ED Course  Procedures (including critical care time) Labs Review Labs Reviewed  URINALYSIS, ROUTINE W REFLEX MICROSCOPIC    Imaging Review Ct Head Wo Contrast  10/03/2013   CLINICAL DATA:  Severe headache; syncope  EXAM: CT HEAD WITHOUT CONTRAST  TECHNIQUE: Contiguous axial images were obtained from the base of the skull through the vertex without intravenous contrast. Study was obtained within 24 hr of patient's arrival at the emergency department.  COMPARISON:  September 18, 2012  FINDINGS: The ventricles are normal in size and configuration. Slight frontal atrophy bilaterally is stable. There is no mass, hemorrhage, extra-axial fluid collection, or midline shift. Gray-white compartments appear normal. There is no demonstrable acute infarct. Bony calvarium appears intact. Mastoid air cells are clear.  IMPRESSION: Slight frontal atrophy bilaterally. No intracranial mass, hemorrhage, or acute infarct.   Electronically Signed   By: Lowella Grip M.D.   On: 10/03/2013 15:55     EKG Interpretation None      MDM    Final diagnoses:  None    pt with hx of migraines evaluated yesterday in ED for headache and syncopal episodes c/o gradually worsening headache.  Pt's medical records reviewed which indicated Labs and head CT were unremarkable. Today, pt appears well, lying flat on exam bed, normal neuro exam. Normal vitals. Pt is c/o urinary symptoms. Will get UA to ensure UTI is not cause of symptoms.  Pt tx with reglan, benadryl, IV fluids and aspirin. Headache was not improved.  Pt given dilaudid and toradol which pt stated made her more fatigued.    Discussed pt with Dr. Ashok Cordia who stated pt may be discharged home if UA unremarkable and pt feels comfortable with outpatient neurology f/u as pt has had multiple head CTs and MRIs w/o obvious source of pt's symptoms.  Doubt normal pressure hydrocephalus, TIA, CVA, or SAH.  3:50 PM Pt signed out to Margarita Mail, PA-C at shift change. Plan is to f/u on UA and reevaluate steadiness of pt.  May consider neurology consult.  If pt feels comfortable being discharge home for outpatient neurology f/u, tx appropriately with nausea and pain medication as well as strict return precautions.     Noland Fordyce, PA-C 10/04/13 [phone]

## 2013-10-05 ENCOUNTER — Telehealth: Payer: Self-pay | Admitting: *Deleted

## 2013-10-05 NOTE — Telephone Encounter (Signed)
Spoke to pts husband who states that pt returned to ED at the advice of Dr Deborra Medina. He states that when they returned they were instructed that there was nothing else that they could do for her, and that she should f/u with her PCP. The ED has confirmed with the neurologist that the pt did not have a stroke, and was only having a bad HA. Pts husband feels as though "we are going in circles." I explained to him that standard protocol for the pt to f/u with her PCP after being seen in the ER. The reasoning behind her returning to the ED is so that she may be able to receive urgent, more extensive care. There may be Tx there that we may not be able to offer her in office, or in the event that we are booked and are unable to be seen here, she may go directly to the ED to receive and be seen.

## 2013-10-05 NOTE — Telephone Encounter (Signed)
Yes call a nurse gave that advise for reasons you stated and they were appropriate.  I agree with what you told him. Marland Kitchen

## 2013-10-08 NOTE — ED Provider Notes (Signed)
Medical screening examination/treatment/procedure(s) were conducted as a shared visit with non-physician practitioner(s) and myself.  I personally evaluated the patient during the encounter.   EKG Interpretation None      Pt with hx fibromyalgia, migraine headaches, c/o intermittent dull, moderate, frontal headaches. Recent eval for same. No positional change to headaches. No problems w balance or coordination. No neck stiffness or rigidity. Afeb.   Mirna Mires, MD 10/08/13 1009

## 2013-10-11 ENCOUNTER — Other Ambulatory Visit: Payer: Self-pay

## 2013-10-11 ENCOUNTER — Other Ambulatory Visit: Payer: Self-pay | Admitting: Family Medicine

## 2013-10-11 MED ORDER — OXYCODONE-ACETAMINOPHEN 5-325 MG PO TABS: 1.0000 | ORAL_TABLET | Freq: Four times a day (QID) | ORAL | Status: DC | PRN

## 2013-10-11 NOTE — Telephone Encounter (Signed)
Pt notified Rx ready for pickup 

## 2013-10-11 NOTE — Telephone Encounter (Signed)
Electronic refill request, last OV 09/20/13

## 2013-10-11 NOTE — Telephone Encounter (Signed)
Rx placed at front desk with her other controlled sub. for pick-up, pt aware

## 2013-10-11 NOTE — Telephone Encounter (Signed)
Pt left v/m requesting rx oxycodone apap. Call when ready for pick up. Pt going out of town next week.

## 2013-10-12 ENCOUNTER — Other Ambulatory Visit: Payer: Self-pay | Admitting: *Deleted

## 2013-10-12 ENCOUNTER — Encounter: Payer: Self-pay | Admitting: Family Medicine

## 2013-10-12 MED ORDER — TIZANIDINE HCL 4 MG PO TABS: 4.0000 mg | ORAL_TABLET | Freq: Three times a day (TID) | ORAL | Status: DC | PRN

## 2013-10-12 NOTE — Telephone Encounter (Signed)
Ok to refill? It doesn't appear that you have refilled this for her before.

## 2013-10-17 ENCOUNTER — Encounter (HOSPITAL_COMMUNITY)
Admission: RE | Admit: 2013-10-17 | Discharge: 2013-10-17 | Disposition: A | Discharge status: Home / Self Care | Payer: Medicare Other | Source: Ambulatory Visit | Attending: Internal Medicine | Admitting: Internal Medicine

## 2013-10-17 DIAGNOSIS — I498 Other specified cardiac arrhythmias: Secondary | ICD-10-CM | POA: Insufficient documentation

## 2013-10-17 DIAGNOSIS — I951 Orthostatic hypotension: Secondary | ICD-10-CM | POA: Insufficient documentation

## 2013-10-17 MED ORDER — SODIUM CHLORIDE 0.9 % IV SOLN
INTRAVENOUS | Status: DC
  Administered 2013-10-17: 11:00:00 via INTRAVENOUS

## 2013-11-05 ENCOUNTER — Emergency Department (HOSPITAL_COMMUNITY)
Admission: EM | Admit: 2013-11-05 | Discharge: 2013-11-05 | Disposition: A | Discharge status: Home / Self Care | Payer: Medicare Other | Attending: Emergency Medicine | Admitting: Emergency Medicine

## 2013-11-05 ENCOUNTER — Encounter (HOSPITAL_COMMUNITY): Payer: Self-pay | Admitting: Emergency Medicine

## 2013-11-05 ENCOUNTER — Emergency Department (HOSPITAL_COMMUNITY): Payer: Medicare Other

## 2013-11-05 ENCOUNTER — Telehealth: Payer: Self-pay | Admitting: Family Medicine

## 2013-11-05 ENCOUNTER — Encounter (HOSPITAL_COMMUNITY)
Admission: RE | Admit: 2013-11-05 | Discharge: 2013-11-05 | Disposition: A | Discharge status: Home / Self Care | Payer: Medicare Other | Source: Ambulatory Visit | Attending: Internal Medicine | Admitting: Internal Medicine

## 2013-11-05 DIAGNOSIS — I498 Other specified cardiac arrhythmias: Secondary | ICD-10-CM | POA: Insufficient documentation

## 2013-11-05 DIAGNOSIS — M542 Cervicalgia: Secondary | ICD-10-CM | POA: Insufficient documentation

## 2013-11-05 DIAGNOSIS — Z880 Allergy status to penicillin: Secondary | ICD-10-CM | POA: Insufficient documentation

## 2013-11-05 DIAGNOSIS — R6889 Other general symptoms and signs: Secondary | ICD-10-CM | POA: Insufficient documentation

## 2013-11-05 DIAGNOSIS — M79609 Pain in unspecified limb: Secondary | ICD-10-CM | POA: Insufficient documentation

## 2013-11-05 DIAGNOSIS — Z79899 Other long term (current) drug therapy: Secondary | ICD-10-CM | POA: Insufficient documentation

## 2013-11-05 DIAGNOSIS — M79601 Pain in right arm: Secondary | ICD-10-CM

## 2013-11-05 DIAGNOSIS — Z8742 Personal history of other diseases of the female genital tract: Secondary | ICD-10-CM | POA: Insufficient documentation

## 2013-11-05 DIAGNOSIS — F410 Panic disorder [episodic paroxysmal anxiety] without agoraphobia: Secondary | ICD-10-CM | POA: Insufficient documentation

## 2013-11-05 DIAGNOSIS — Z87828 Personal history of other (healed) physical injury and trauma: Secondary | ICD-10-CM | POA: Insufficient documentation

## 2013-11-05 DIAGNOSIS — G43909 Migraine, unspecified, not intractable, without status migrainosus: Secondary | ICD-10-CM | POA: Insufficient documentation

## 2013-11-05 DIAGNOSIS — Z8679 Personal history of other diseases of the circulatory system: Secondary | ICD-10-CM | POA: Insufficient documentation

## 2013-11-05 DIAGNOSIS — I951 Orthostatic hypotension: Secondary | ICD-10-CM | POA: Insufficient documentation

## 2013-11-05 DIAGNOSIS — Z8739 Personal history of other diseases of the musculoskeletal system and connective tissue: Secondary | ICD-10-CM

## 2013-11-05 DIAGNOSIS — M79604 Pain in right leg: Secondary | ICD-10-CM

## 2013-11-05 DIAGNOSIS — R6883 Chills (without fever): Secondary | ICD-10-CM | POA: Insufficient documentation

## 2013-11-05 DIAGNOSIS — M549 Dorsalgia, unspecified: Secondary | ICD-10-CM | POA: Insufficient documentation

## 2013-11-05 DIAGNOSIS — R209 Unspecified disturbances of skin sensation: Secondary | ICD-10-CM | POA: Insufficient documentation

## 2013-11-05 DIAGNOSIS — F319 Bipolar disorder, unspecified: Secondary | ICD-10-CM | POA: Insufficient documentation

## 2013-11-05 LAB — CBC WITH DIFFERENTIAL/PLATELET
BASOS ABS: 0 10*3/uL (ref 0.0–0.1)
Basophils Relative: 1 % (ref 0–1)
EOS PCT: 1 % (ref 0–5)
Eosinophils Absolute: 0.1 10*3/uL (ref 0.0–0.7)
HEMATOCRIT: 40 % (ref 36.0–46.0)
Hemoglobin: 13.4 g/dL (ref 12.0–15.0)
LYMPHS ABS: 2.1 10*3/uL (ref 0.7–4.0)
Lymphocytes Relative: 32 % (ref 12–46)
MCH: 29.3 pg (ref 26.0–34.0)
MCHC: 33.5 g/dL (ref 30.0–36.0)
MCV: 87.5 fL (ref 78.0–100.0)
Monocytes Absolute: 0.5 10*3/uL (ref 0.1–1.0)
Monocytes Relative: 7 % (ref 3–12)
Neutro Abs: 3.8 10*3/uL (ref 1.7–7.7)
Neutrophils Relative %: 59 % (ref 43–77)
Platelets: 219 10*3/uL (ref 150–400)
RBC: 4.57 MIL/uL (ref 3.87–5.11)
RDW: 13.6 % (ref 11.5–15.5)
WBC: 6.5 10*3/uL (ref 4.0–10.5)

## 2013-11-05 LAB — BASIC METABOLIC PANEL
BUN: 9 mg/dL (ref 6–23)
CALCIUM: 8.2 mg/dL — AB (ref 8.4–10.5)
CO2: 17 mEq/L — ABNORMAL LOW (ref 19–32)
Chloride: 110 mEq/L (ref 96–112)
Creatinine, Ser: 1.11 mg/dL — ABNORMAL HIGH (ref 0.50–1.10)
GFR calc Af Amer: 69 mL/min — ABNORMAL LOW (ref >90)
GFR calc non Af Amer: 59 mL/min — ABNORMAL LOW (ref >90)
GLUCOSE: 85 mg/dL (ref 70–99)
Potassium: 4.4 mEq/L (ref 3.7–5.3)
Sodium: 139 mEq/L (ref 137–147)

## 2013-11-05 MED ORDER — HYDROMORPHONE HCL PF 1 MG/ML IJ SOLN
1.0000 mg | Freq: Once | INTRAMUSCULAR | Status: AC
  Administered 2013-11-05: 1 mg via INTRAVENOUS
  Filled 2013-11-05: qty 1

## 2013-11-05 MED ORDER — SODIUM CHLORIDE 0.9 % IV SOLN
INTRAVENOUS | Status: DC
  Administered 2013-11-05: 1000 mL via INTRAVENOUS

## 2013-11-05 MED ORDER — PREDNISONE 10 MG PO TABS: 20.0000 mg | ORAL_TABLET | Freq: Two times a day (BID) | ORAL | Status: DC

## 2013-11-05 MED ORDER — METHYLPREDNISOLONE SODIUM SUCC 125 MG IJ SOLR
125.0000 mg | Freq: Once | INTRAMUSCULAR | Status: AC
  Administered 2013-11-05: 125 mg via INTRAVENOUS
  Filled 2013-11-05: qty 2

## 2013-11-05 MED ORDER — KETOROLAC TROMETHAMINE 30 MG/ML IJ SOLN
30.0000 mg | Freq: Once | INTRAMUSCULAR | Status: AC
  Administered 2013-11-05: 30 mg via INTRAVENOUS
  Filled 2013-11-05: qty 1

## 2013-11-05 MED ORDER — SODIUM CHLORIDE 0.9 % IV BOLUS (SEPSIS)
1000.0000 mL | Freq: Once | INTRAVENOUS | Status: AC
  Administered 2013-11-05: 1000 mL via INTRAVENOUS

## 2013-11-05 NOTE — ED Notes (Signed)
Called for triage with no answer

## 2013-11-05 NOTE — ED Notes (Signed)
Pt c/o pain in right arm and leg that started 4 days ago then right lateral neck pain started today. Denies decrease in mobility, sts that with movement pain increases. Denies swelling/bruising/injury/trauma to all these locations. Pt has hx of fibromyalgia, sts this pain isnt in her normal fibromyalgia spots. Nad, skin warm and dry, resp e/u.

## 2013-11-05 NOTE — ED Notes (Addendum)
She c/o R arm, R leg, R side of neck pain since Wednesday. She just drove in car from [place] and that's when the pain started. She denies any injuries. Her pain management doctor did not return their phone call so they decided to come here.

## 2013-11-05 NOTE — Telephone Encounter (Signed)
Attempted to contact pt. Person answering the phone indicates that the pt is currently being seen at [facility] ED

## 2013-11-05 NOTE — Telephone Encounter (Signed)
Is this her fibromyalgia pain?  I think she is followed by pain clinic so she would need to contact them.

## 2013-11-05 NOTE — ED Notes (Signed)
Pt in radiology 

## 2013-11-05 NOTE — ED Notes (Signed)
Pt returned from radiology.

## 2013-11-05 NOTE — Telephone Encounter (Signed)
Pt's husband called and stated that they had left a message on 11/02/13 with the "triage nurse" and had not heard anything, checked with Levada Dy, no calls came through at [facility].  Pt is in significant pain, right arm and leg, pain is at a 10 on scale of 1-10.  Best number to contact is [phone].  [facility] choice [facility]

## 2013-11-05 NOTE — Discharge Instructions (Signed)
Prednisone as prescribed.  Continue your Percocet as needed for pain.  Followup with your primary Dr. if not improving in the next several days, and return if your symptoms substantially worsen or change.   Musculoskeletal Pain Musculoskeletal pain is muscle and boney aches and pains. These pains can occur in any part of the body. Your caregiver may treat you without knowing the cause of the pain. They may treat you if blood or urine tests, X-rays, and other tests were normal.  CAUSES There is often not a definite cause or reason for these pains. These pains may be caused by a type of germ (virus). The discomfort may also come from overuse. Overuse includes working out too hard when your body is not fit. Boney aches also come from weather changes. Bone is sensitive to atmospheric pressure changes. HOME CARE INSTRUCTIONS   Ask when your test results will be ready. Make sure you get your test results.  Only take over-the-counter or prescription medicines for pain, discomfort, or fever as directed by your caregiver. If you were given medications for your condition, do not drive, operate machinery or power tools, or sign legal documents for 24 hours. Do not drink alcohol. Do not take sleeping pills or other medications that may interfere with treatment.  Continue all activities unless the activities cause more pain. When the pain lessens, slowly resume normal activities. Gradually increase the intensity and duration of the activities or exercise.  During periods of severe pain, bed rest may be helpful. Lay or sit in any position that is comfortable.  Putting ice on the injured area.  Put ice in a bag.  Place a towel between your skin and the bag.  Leave the ice on for 15 to 20 minutes, 3 to 4 times a day.  Follow up with your caregiver for continued problems and no reason can be found for the pain. If the pain becomes worse or does not go away, it may be necessary to repeat tests or do  additional testing. Your caregiver may need to look further for a possible cause. SEEK IMMEDIATE MEDICAL CARE IF:  You have pain that is getting worse and is not relieved by medications.  You develop chest pain that is associated with shortness or breath, sweating, feeling sick to your stomach (nauseous), or throw up (vomit).  Your pain becomes localized to the abdomen.  You develop any new symptoms that seem different or that concern you. MAKE SURE YOU:   Understand these instructions.  Will watch your condition.  Will get help right away if you are not doing well or get worse. Document Released: 06/07/2005 Document Revised: 08/30/2011 Document Reviewed: 02/09/2013 [facility] Patient Information 2014 [place].

## 2013-11-05 NOTE — ED Provider Notes (Signed)
CSN: [id number]     Arrival date & time 11/05/13  1039 History  This chart was scribed for non-physician practitioner, Debroah Baller, [place] working with Veryl Speak, MD by Frederich Balding, ED scribe. This patient was seen in room TR05C/TR05C and the patient's care was started at 11:21 AM.   Chief Complaint  Patient presents with  . Generalized Body Aches   The history is provided by the patient. No language interpreter was used.   HPI Comments: Melissa [name] is a 43 y.o. female with history of fibromyalgia and POTS who presents to the Emergency Department complaining of worsening generalized myalgias started about one week ago. She states the pain is primarily on the right side of her neck and right side of her back. Pt states this is not like her normal fibromyalgia pain. States she has had numbness in RUE, chills and cold sweats. Denies fever, sore throat, ear pain, cough, chest pain, palpitations. Pt has tried to get an appointment with her PCP but has not gotten a call back yet. States her blood pressure is normally low. Pt get IV fluids every other week for POTS.  Past Medical History  Diagnosis Date  . Bipolar disorder   . Fibromyalgia     on disability  . Migraine   . Panic attack   . Muscle spasm   . Uterine perforation     hx of  . Syncope   . POTS (postural orthostatic tachycardia syndrome)    Past Surgical History  Procedure Laterality Date  . Dilation and curettage of uterus    . Cataract extraction  dec 2014  . Lasik  dec 2014   Family History  Problem Relation Age of Onset  . Diabetes Mother   . Hypertension Mother   . Cancer Father     brain tumor   History  Substance Use Topics  . Smoking status: Never Smoker   . Smokeless tobacco: Not on file  . Alcohol Use: No   OB History   Grav Para Term Preterm Abortions TAB SAB Ect Mult Living                 Review of Systems  Constitutional: Positive for chills. Negative for fever.  HENT: Negative for sore throat.    Respiratory: Negative for cough.   Cardiovascular: Negative for chest pain and palpitations.  Musculoskeletal: Positive for back pain, myalgias and neck pain.  Neurological: Positive for numbness.  All other systems reviewed and are negative.  Allergies  Azithromycin; Depakote; Lithium; Penicillins; Seroquel; and Sulfa antibiotics  Home Medications   Prior to Admission medications   Medication Sig Start Date End Date Taking? Authorizing Provider  acetaminophen (TYLENOL) 500 MG tablet Take 1,000 mg by mouth every 6 (six) hours as needed for pain.    Historical Provider, MD  alendronate (FOSAMAX) 70 MG tablet Take 70 mg by mouth once a week. Take with a full glass of water on an empty stomach. On saturdays    Historical Provider, MD  cholecalciferol (VITAMIN D) 400 UNITS TABS tablet Take 3,000 Units by mouth daily.     Historical Provider, MD  clonazePAM (KLONOPIN) 1 MG tablet TAKE ONE TABLET TWICE DAILY FOR ANXIETY 10/11/13   Lucille Passy, MD  fexofenadine (ALLEGRA) 180 MG tablet Take 180 mg by mouth daily.    Historical Provider, MD  gabapentin (NEURONTIN) 300 MG capsule Take 600 mg by mouth 2 (two) times daily.    Historical Provider, MD  medroxyPROGESTERone (DEPO-PROVERA)  150 MG/ML injection Inject 150 mg into the muscle every 3 (three) months.    Historical Provider, MD  oxyCODONE-acetaminophen (PERCOCET/ROXICET) 5-325 MG per tablet Take 1 tablet by mouth every 6 (six) hours as needed for severe pain. 10/11/13   Lucille Passy, MD  Probiotic Product (PROBIOTIC DAILY PO) Take 1 tablet by mouth daily.     Historical Provider, MD  promethazine (PHENERGAN) 25 MG tablet Take 1 tablet (25 mg total) by mouth every 8 (eight) hours as needed for nausea or vomiting. 09/25/13   [place], PA-C  ranitidine (ZANTAC) 150 MG tablet Take 150 mg by mouth daily.    Historical Provider, MD  SUMAtriptan (IMITREX) 100 MG tablet Take 100 mg by mouth daily as needed for migraine.    Historical Provider,  MD  tiZANidine (ZANAFLEX) 4 MG tablet Take 1 tablet (4 mg total) by mouth every 8 (eight) hours as needed (for muscle spasms). 10/12/13   Lucille Passy, MD  topiramate (TOPAMAX) 50 MG tablet Take 100 mg by mouth daily.    Historical Provider, MD  traZODone (DESYREL) 100 MG tablet Take 200 mg by mouth at bedtime.     Historical Provider, MD  venlafaxine XR (EFFEXOR-XR) 75 MG 24 hr capsule Take 150 mg by mouth every morning.    Historical Provider, MD   BP 99/57  Pulse 88  Temp(Src) 98.1 F (36.7 C) (Oral)  Resp 16  SpO2 97%  Physical Exam  Nursing note and vitals reviewed. Constitutional: She is oriented to person, place, and time. She appears well-developed and well-nourished. No distress.  HENT:  Head: Normocephalic and atraumatic.  Right Ear: Tympanic membrane and ear canal normal.  Left Ear: Tympanic membrane and ear canal normal.  Mouth/Throat: Uvula is midline and oropharynx is clear and moist. No posterior oropharyngeal edema or posterior oropharyngeal erythema.  Eyes: EOM are normal.  Neck: Neck supple. No tracheal deviation present.  Cardiovascular: Normal rate.   Pulmonary/Chest: Effort normal. No respiratory distress.  Musculoskeletal: Normal range of motion.  Tenderness over cervical spine. Tenderness over right side of the neck.   Lymphadenopathy:    She has no cervical adenopathy.  Neurological: She is alert and oriented to person, place, and time.  Reflex Scores:      Patellar reflexes are 2+ on the right side and 2+ on the left side. Slightly decreased grip strength on the right.   Skin: Skin is warm and dry.  Psychiatric: She has a normal mood and affect. Her behavior is normal.    ED Course  Procedures (including critical care time)  DIAGNOSTIC STUDIES: Oxygen Saturation is 97% on RA, normal by my interpretation.    COORDINATION OF CARE: 11:28 AM-Discussed treatment plan which includes lab work with pt at bedside and pt agreed to plan.   Labs Review  MDM   43 y.o. female with right side pain and weakness x 24 hours that has gotten worse. I discussed with Dr. Stark Jock and he will assume care of the patient for further evaluation. She will be moved to [place] A room # 2.   I personally performed the services described in this documentation, which was scribed in my presence. The recorded information has been reviewed and is accurate.  Ashley [place], [place] 11/05/13 1141

## 2013-11-06 NOTE — ED Provider Notes (Signed)
Medical screening examination/treatment/procedure(s) were conducted as a shared visit with non-physician practitioner(s) and myself.  I personally evaluated the patient during the encounter. Patient is a 43 year old female with history of fibromyalgia, POTS disease, migraines. She presents today with complaints of pain in the right arm and right leg in the absence of any injury or trauma. She denies any weakness or numbness. She denies any neck pain. She states this feels different from her fibromyalgia pain.  On exam, vitals are stable and the patient is afebrile. Head is atraumatic, normocephalic. Neck is supple. There is no soft tissue or midline tenderness to palpation. She has full range of motion without limitation. Heart is regular rate and rhythm and lungs are clear. The right upper and right lower extremity appear grossly normal and are symmetrical with the left. Strength is 5 out of 5 in the bilateral upper and lower extremities. Patellar and Achilles DTRs are 2+ and symmetrical.  Patient presents with pain in the right arm and leg which appears musculoskeletal in nature. There is no evidence for CVA or cervical spine pathology. She is feeling better with medications given in the ER and I feel as though she is stable for discharge. She understands to return if her symptoms substantially worsen or change.     Veryl Speak, MD 11/06/13 [phone]

## 2013-11-14 ENCOUNTER — Other Ambulatory Visit: Payer: Self-pay | Admitting: Family Medicine

## 2013-11-14 ENCOUNTER — Encounter: Payer: Self-pay | Admitting: Family Medicine

## 2013-11-15 ENCOUNTER — Ambulatory Visit (INDEPENDENT_AMBULATORY_CARE_PROVIDER_SITE_OTHER): Payer: Medicare Other | Admitting: Family Medicine

## 2013-11-15 ENCOUNTER — Encounter: Payer: Self-pay | Admitting: Family Medicine

## 2013-11-15 VITALS — BP 124/78 | HR 102 | Temp 97.9°F | Wt 171.2 lb

## 2013-11-15 DIAGNOSIS — I951 Orthostatic hypotension: Secondary | ICD-10-CM

## 2013-11-15 DIAGNOSIS — IMO0001 Reserved for inherently not codable concepts without codable children: Secondary | ICD-10-CM

## 2013-11-15 DIAGNOSIS — G90A Postural orthostatic tachycardia syndrome (POTS): Secondary | ICD-10-CM

## 2013-11-15 DIAGNOSIS — N289 Disorder of kidney and ureter, unspecified: Secondary | ICD-10-CM

## 2013-11-15 DIAGNOSIS — M797 Fibromyalgia: Secondary | ICD-10-CM

## 2013-11-15 DIAGNOSIS — R52 Pain, unspecified: Secondary | ICD-10-CM

## 2013-11-15 DIAGNOSIS — R000 Tachycardia, unspecified: Secondary | ICD-10-CM

## 2013-11-15 DIAGNOSIS — I498 Other specified cardiac arrhythmias: Secondary | ICD-10-CM

## 2013-11-15 LAB — COMPREHENSIVE METABOLIC PANEL
ALBUMIN: 3.8 g/dL (ref 3.5–5.2)
ALT: 24 U/L (ref 0–35)
AST: 23 U/L (ref 0–37)
Alkaline Phosphatase: 35 U/L — ABNORMAL LOW (ref 39–117)
BUN: 7 mg/dL (ref 6–23)
CALCIUM: 8.5 mg/dL (ref 8.4–10.5)
CHLORIDE: 114 meq/L — AB (ref 96–112)
CO2: 22 mEq/L (ref 19–32)
Creatinine, Ser: 1.3 mg/dL — ABNORMAL HIGH (ref 0.4–1.2)
GFR: 48.54 mL/min — ABNORMAL LOW (ref >60.00)
GLUCOSE: 100 mg/dL — AB (ref 70–99)
POTASSIUM: 3.5 meq/L (ref 3.5–5.1)
Sodium: 140 mEq/L (ref 135–145)
TOTAL PROTEIN: 6.4 g/dL (ref 6.0–8.3)
Total Bilirubin: 0.5 mg/dL (ref 0.2–1.2)

## 2013-11-15 LAB — T4, FREE: Free T4: 0.67 ng/dL (ref 0.60–1.60)

## 2013-11-15 LAB — VITAMIN B12: Vitamin B-12: 202 pg/mL — ABNORMAL LOW (ref 211–911)

## 2013-11-15 MED ORDER — OXYCODONE-ACETAMINOPHEN 5-325 MG PO TABS: 1.0000 | ORAL_TABLET | Freq: Four times a day (QID) | ORAL | Status: DC | PRN

## 2013-11-15 NOTE — Assessment & Plan Note (Signed)
In a very complicated pt. Will check some further lab work today.  Since she feels this is different from her fibromyalgia pain and resistant to her typical fibro treatment, will also get an MRI of her head. Consider neuro referral if neg. Percocet rx refilled and given to pt.

## 2013-11-15 NOTE — Progress Notes (Signed)
   Subjective:   Patient ID: Melissa [name], female    DOB: Apr 25, 1971, 43 y.o.   MRN: [id number]  Melissa [name] is a pleasant 43 y.o. year old female who presents to clinic today with Fibromyalgia  on 11/15/2013  HPI: Known h/o fibromyalgia and POTs here for ER follow up. Was seen in ER on 11/05/2013- note reviewed. Seen for worsening generalized myalgias and right sided pain of her body- leg, ankle, hip, arms, back.  Per pt, different than her fibromyalgia pain.  Cervical spine xray neg.  Dg Cervical Spine Complete  11/05/2013   CLINICAL DATA:  Right neck and arm pain.  No injury.  EXAM: CERVICAL SPINE  4+ VIEWS  COMPARISON:  None.  FINDINGS: Vertebral body alignment, heights and disc spaces are within normal. There is mild spondylosis present. Prevertebral soft tissues are within normal. There is no significant neural foraminal narrowing. The atlantoaxial articulation is within normal.  IMPRESSION: Mild spondylosis.   Electronically Signed   By: Marin Olp M.D.   On: 11/05/2013 13:16    CBC, BMET unremarkable.  Was sent home and advised to follow up with me.  Does receive weekly IVF infusions for POTs.  Saw her pain doctor last Thursday and was told it was her fibromyalgia- given injection of Toradol which did not help. Also received her lidocaine infusion (for fibro)on Monday which typically helps her fibro but right side of her body is still hurting.  No results found for this basename: TSH   Today having pain in right shoulder into her hands, right hip all the way into her ankle and toes.  Pain is constant- 9/10 all the time.  Crying in pain.  No known injury.  Never had anything like this in past.  Percocet helps but does not take it away.      Review of Systems No new hand tremors +bilateral HA No blurred vision  No nausea or vomiting     Objective:    BP 124/78  Pulse 102  Temp(Src) 97.9 F (36.6 C) (Oral)  Wt 171 lb 4 oz (77.678 kg)  SpO2 98%   Physical  Exam  Gen: alert, tearful Neuro: Slow gait, no shuffling No facial droop CNII-XII intact Right decreased grip strength +mildly increased Patellar reflexes      Assessment & Plan:   Pain of right side of body No Follow-up on file.

## 2013-11-15 NOTE — Progress Notes (Signed)
Pre visit review using our clinic review tool, if applicable. No additional management support is needed unless otherwise documented below in the visit note. 

## 2013-11-15 NOTE — Patient Instructions (Signed)
Good to see you. I am so sorry you are hurting.  Hang in there.  After you go to the lab, please stop by to see Rosaria Ferries on your way out.

## 2013-11-16 LAB — VITAMIN D 25 HYDROXY (VIT D DEFICIENCY, FRACTURES): Vit D, 25-Hydroxy: 62 ng/mL (ref 30–89)

## 2013-11-16 NOTE — Addendum Note (Signed)
Addended by: Modena Nunnery on: 11/16/2013 02:39 PM   Modules accepted: Orders

## 2013-11-18 ENCOUNTER — Ambulatory Visit
Admission: RE | Admit: 2013-11-18 | Discharge: 2013-11-18 | Disposition: A | Discharge status: Home / Self Care | Payer: Medicare Other | Source: Ambulatory Visit | Attending: Family Medicine | Admitting: Family Medicine

## 2013-11-18 DIAGNOSIS — R52 Pain, unspecified: Secondary | ICD-10-CM

## 2013-11-18 MED ORDER — GADOBENATE DIMEGLUMINE 529 MG/ML IV SOLN
15.0000 mL | Freq: Once | INTRAVENOUS | Status: AC | PRN
  Administered 2013-11-18: 15 mL via INTRAVENOUS

## 2013-11-19 ENCOUNTER — Other Ambulatory Visit: Payer: Self-pay | Admitting: Family Medicine

## 2013-11-19 DIAGNOSIS — R52 Pain, unspecified: Secondary | ICD-10-CM

## 2013-11-20 ENCOUNTER — Emergency Department (HOSPITAL_COMMUNITY)
Admission: EM | Admit: 2013-11-20 | Discharge: 2013-11-21 | Disposition: A | Discharge status: Home / Self Care | Payer: Medicare Other | Attending: Emergency Medicine | Admitting: Emergency Medicine

## 2013-11-20 ENCOUNTER — Encounter (HOSPITAL_COMMUNITY): Payer: Self-pay | Admitting: Emergency Medicine

## 2013-11-20 ENCOUNTER — Telehealth: Payer: Self-pay | Admitting: *Deleted

## 2013-11-20 DIAGNOSIS — IMO0001 Reserved for inherently not codable concepts without codable children: Secondary | ICD-10-CM | POA: Insufficient documentation

## 2013-11-20 DIAGNOSIS — F319 Bipolar disorder, unspecified: Secondary | ICD-10-CM | POA: Insufficient documentation

## 2013-11-20 DIAGNOSIS — M79609 Pain in unspecified limb: Secondary | ICD-10-CM | POA: Insufficient documentation

## 2013-11-20 DIAGNOSIS — R52 Pain, unspecified: Secondary | ICD-10-CM

## 2013-11-20 DIAGNOSIS — F410 Panic disorder [episodic paroxysmal anxiety] without agoraphobia: Secondary | ICD-10-CM | POA: Insufficient documentation

## 2013-11-20 DIAGNOSIS — Z79899 Other long term (current) drug therapy: Secondary | ICD-10-CM | POA: Insufficient documentation

## 2013-11-20 DIAGNOSIS — G90A Postural orthostatic tachycardia syndrome (POTS): Secondary | ICD-10-CM

## 2013-11-20 DIAGNOSIS — Z8742 Personal history of other diseases of the female genital tract: Secondary | ICD-10-CM | POA: Insufficient documentation

## 2013-11-20 DIAGNOSIS — G43909 Migraine, unspecified, not intractable, without status migrainosus: Secondary | ICD-10-CM | POA: Insufficient documentation

## 2013-11-20 DIAGNOSIS — Z880 Allergy status to penicillin: Secondary | ICD-10-CM | POA: Insufficient documentation

## 2013-11-20 DIAGNOSIS — R000 Tachycardia, unspecified: Secondary | ICD-10-CM

## 2013-11-20 DIAGNOSIS — Z3202 Encounter for pregnancy test, result negative: Secondary | ICD-10-CM | POA: Insufficient documentation

## 2013-11-20 DIAGNOSIS — G909 Disorder of the autonomic nervous system, unspecified: Secondary | ICD-10-CM | POA: Insufficient documentation

## 2013-11-20 DIAGNOSIS — R55 Syncope and collapse: Secondary | ICD-10-CM

## 2013-11-20 DIAGNOSIS — I951 Orthostatic hypotension: Secondary | ICD-10-CM

## 2013-11-20 LAB — BASIC METABOLIC PANEL
BUN: 10 mg/dL (ref 6–23)
CHLORIDE: 110 meq/L (ref 96–112)
CO2: 21 mEq/L (ref 19–32)
Calcium: 8.7 mg/dL (ref 8.4–10.5)
Creatinine, Ser: 1.1 mg/dL (ref 0.50–1.10)
GFR calc Af Amer: 70 mL/min — ABNORMAL LOW (ref >90)
GFR calc non Af Amer: 60 mL/min — ABNORMAL LOW (ref >90)
Glucose, Bld: 86 mg/dL (ref 70–99)
POTASSIUM: 4.4 meq/L (ref 3.7–5.3)
SODIUM: 140 meq/L (ref 137–147)

## 2013-11-20 LAB — CBC
HEMATOCRIT: 37.8 % (ref 36.0–46.0)
Hemoglobin: 12.6 g/dL (ref 12.0–15.0)
MCH: 29 pg (ref 26.0–34.0)
MCHC: 33.3 g/dL (ref 30.0–36.0)
MCV: 87.1 fL (ref 78.0–100.0)
PLATELETS: 239 10*3/uL (ref 150–400)
RBC: 4.34 MIL/uL (ref 3.87–5.11)
RDW: 13.7 % (ref 11.5–15.5)
WBC: 8.3 10*3/uL (ref 4.0–10.5)

## 2013-11-20 LAB — TROPONIN I

## 2013-11-20 LAB — PREGNANCY, URINE: PREG TEST UR: NEGATIVE

## 2013-11-20 MED ORDER — HYDROMORPHONE HCL PF 1 MG/ML IJ SOLN
1.0000 mg | Freq: Once | INTRAMUSCULAR | Status: AC
  Administered 2013-11-20: 1 mg via INTRAVENOUS
  Filled 2013-11-20: qty 1

## 2013-11-20 MED ORDER — SODIUM CHLORIDE 0.9 % IV BOLUS (SEPSIS)
1000.0000 mL | Freq: Once | INTRAVENOUS | Status: AC
  Administered 2013-11-20: 1000 mL via INTRAVENOUS

## 2013-11-20 MED ORDER — ONDANSETRON HCL 4 MG/2ML IJ SOLN
4.0000 mg | Freq: Once | INTRAMUSCULAR | Status: AC
  Administered 2013-11-20: 4 mg via INTRAVENOUS
  Filled 2013-11-20: qty 2

## 2013-11-20 MED ORDER — HYDROMORPHONE HCL PF 1 MG/ML IJ SOLN
1.0000 mg | Freq: Once | INTRAMUSCULAR | Status: AC
Start: 2013-11-20 — End: 2013-11-20
  Administered 2013-11-20: 1 mg via INTRAVENOUS
  Filled 2013-11-20: qty 1

## 2013-11-20 MED ORDER — MORPHINE SULFATE 4 MG/ML IJ SOLN
4.0000 mg | Freq: Once | INTRAMUSCULAR | Status: DC
  Filled 2013-11-20: qty 1

## 2013-11-20 NOTE — ED Notes (Signed)
Pt reports R side pain since 5/15.  She reports having neck pain with her R side pain, also R arm and R leg pain.  Reports having MRI of her neck recently.  Pt has hx of fibromyalgia.  Pt was on the floor, pt's husband reports pt has been having syncopal episodes, last episode was yesterday.  Pt is A&Ox 4.

## 2013-11-20 NOTE — Telephone Encounter (Signed)
pts husband contact office on today and wanted to discuss wife's pain. He was informed that the referral has been placed for wife to see neuro. Dr Deborra Medina also advised pt to contact fibromyalgia physician to see if they are able to help pt manage pain until she can be seen by neuro. Pts husband verbally expressed understanding and states that they will await a call from [facility] with appt details.

## 2013-11-20 NOTE — ED Provider Notes (Signed)
CSN: [id number]     Arrival date & time 11/20/13  1821 History   First MD Initiated Contact with Patient 11/20/13 2042     Chief Complaint  Patient presents with  . Extremity Pain     (Consider location/radiation/quality/duration/timing/severity/associated sxs/prior Treatment) HPI Comments: Patient is a 43 year old female past medical history significant for bipolar disorder, fibromyalgia, migraines, panic attack, muscle spasm, syncope, postural orthostatic tachycardia syndrome presenting to the emergency department for complaints. Patient's first complaint is continued right-sided, arm and leg pain. Patient states she has had increased pain from her typical chronic pain flares since just prior to May 15th without any known injuries or falls. Patient was evaluated in the emergency department initially for this on the 18th and had a normal MRI of her brain and normal films of her cervical spine on the 28th.  Patient denies any changes to her pain or new symptoms or new injuries. She states she feels that her current care physician is not appropriately managing her chronic pain.  Patient's second complaint is 2 episodes of syncope. Patient states that she had a syncopal episode yesterday without precipitating headache, chest pain, shortness of breath. Patient states that she had a syncopal episode in triage d/t increased right arm pain during palpation portion of assessment. Patient states she is mostly concerned about her right-sided pain rather then syncopal episodes.   Denies any fevers, chills, nausea, vomiting, diarrhea, abdominal pain.    Past Medical History  Diagnosis Date  . Bipolar disorder   . Fibromyalgia     on disability  . Migraine   . Panic attack   . Muscle spasm   . Uterine perforation     hx of  . Syncope   . POTS (postural orthostatic tachycardia syndrome)    Past Surgical History  Procedure Laterality Date  . Dilation and curettage of uterus    . Cataract  extraction  dec 2014  . Lasik  dec 2014   Family History  Problem Relation Age of Onset  . Diabetes Mother   . Hypertension Mother   . Cancer Father     brain tumor   History  Substance Use Topics  . Smoking status: Never Smoker   . Smokeless tobacco: Not on file  . Alcohol Use: No   OB History   Grav Para Term Preterm Abortions TAB SAB Ect Mult Living                 Review of Systems  Constitutional: Negative for fever and chills.  Respiratory: Negative for shortness of breath.   Cardiovascular: Negative for chest pain.  Gastrointestinal: Negative for nausea, vomiting and abdominal pain.  Musculoskeletal: Positive for arthralgias and myalgias.  Neurological: Positive for syncope. Negative for dizziness, light-headedness and headaches.  All other systems reviewed and are negative.     Allergies  Azithromycin; Depakote; Lithium; Penicillins; Seroquel; and Sulfa antibiotics  Home Medications   Prior to Admission medications   Medication Sig Start Date End Date Taking? Authorizing Provider  alendronate (FOSAMAX) 70 MG tablet Take 70 mg by mouth once a week. Take with a full glass of water on an empty stomach. On saturdays   Yes Historical Provider, MD  cholecalciferol (VITAMIN D) 400 UNITS TABS tablet Take 3,000 Units by mouth daily.    Yes Historical Provider, MD  clonazePAM (KLONOPIN) 1 MG tablet Take 1 mg by mouth 3 (three) times daily as needed for anxiety (anxiety).   Yes Historical Provider, MD  fexofenadine Delma Freeze)  180 MG tablet Take 180 mg by mouth daily.   Yes Historical Provider, MD  Lurasidone HCl (LATUDA) 20 MG TABS Take 1 tablet by mouth daily.   Yes Historical Provider, MD  Probiotic Product (PROBIOTIC DAILY PO) Take 1 tablet by mouth daily.    Yes Historical Provider, MD  promethazine (PHENERGAN) 25 MG tablet Take 1 tablet (25 mg total) by mouth every 8 (eight) hours as needed for nausea or vomiting. 09/25/13  Yes Resa Miner [place], PA-C  ranitidine  (ZANTAC) 150 MG tablet Take 150 mg by mouth daily.   Yes Historical Provider, MD  tiZANidine (ZANAFLEX) 4 MG tablet TAKE 1 TABLET EVERY 8 HOURS AS NEEDED FOR MUSCLE SPASMS. 11/14/13  Yes Lucille Passy, MD  topiramate (TOPAMAX) 50 MG tablet Take 100 mg by mouth daily.   Yes Historical Provider, MD  traZODone (DESYREL) 100 MG tablet Take 200 mg by mouth at bedtime.    Yes Historical Provider, MD  venlafaxine XR (EFFEXOR-XR) 75 MG 24 hr capsule Take 150 mg by mouth every morning.   Yes Historical Provider, MD  vitamin B-12 (CYANOCOBALAMIN) 1000 MCG tablet Take 1,000 mcg by mouth daily.   Yes Historical Provider, MD  gabapentin (NEURONTIN) 300 MG capsule TAKE TWO CAPSULES TWICE A DAY 11/14/13   Lucille Passy, MD  medroxyPROGESTERone (DEPO-PROVERA) 150 MG/ML injection Inject 150 mg into the muscle every 3 (three) months.    Historical Provider, MD  oxyCODONE-acetaminophen (PERCOCET/ROXICET) 5-325 MG per tablet Take 1 tablet by mouth every 6 (six) hours as needed for severe pain. 11/15/13   Lucille Passy, MD   BP 109/65  Pulse 88  Temp(Src) 98.2 F (36.8 C) (Oral)  Resp 18  SpO2 98% Physical Exam  Nursing note and vitals reviewed. Constitutional: She is oriented to person, place, and time. She appears well-developed and well-nourished. No distress.  HENT:  Head: Normocephalic and atraumatic.  Right Ear: External ear normal.  Left Ear: External ear normal.  Nose: Nose normal.  Mouth/Throat: Oropharynx is clear and moist. No oropharyngeal exudate.  Eyes: Conjunctivae and EOM are normal. Pupils are equal, round, and reactive to light.  Neck: Normal range of motion. Neck supple.  Cardiovascular: Normal rate, regular rhythm, normal heart sounds and intact distal pulses.   Pulmonary/Chest: Effort normal and breath sounds normal. No respiratory distress.  Abdominal: Soft. There is no tenderness.  Musculoskeletal: She exhibits no edema.  Neurological: She is alert and oriented to person, place, and time.  She displays no tremor. No cranial nerve deficit or sensory deficit. She displays no seizure activity. Gait normal. GCS eye subscore is 4. GCS verbal subscore is 5. GCS motor subscore is 6.  Right upper and right lower extremity 4+/5  LUE and LLE extremity 5/5.  Sensation grossly intact.  No pronator drift.  Bilateral heel-knee-shin intact.  Skin: Skin is warm and dry. She is not diaphoretic.    ED Course  Procedures (including critical care time) Medications  sodium chloride 0.9 % bolus 1,000 mL (0 mLs Intravenous Stopped 11/20/13 2303)  ondansetron (ZOFRAN) injection 4 mg (4 mg Intravenous Given 11/20/13 2211)  HYDROmorphone (DILAUDID) injection 1 mg (1 mg Intravenous Given 11/20/13 2238)  HYDROmorphone (DILAUDID) injection 1 mg (1 mg Intravenous Given 11/20/13 2344)    Labs Review Labs Reviewed  BASIC METABOLIC PANEL - Abnormal; Notable for the following:    GFR calc non Af Amer 60 (*)    GFR calc Af Amer 70 (*)    All other components  within normal limits  PREGNANCY, URINE  CBC  TROPONIN I    Imaging Review No results found.   EKG Interpretation None       Date: 11/21/2013  Rate: 60  Rhythm: normal sinus rhythm  QRS Axis: normal  Intervals: normal  ST/T Wave abnormalities: normal  Conduction Disutrbances:none  Narrative Interpretation:   Old EKG Reviewed: unchanged    MDM   Final diagnoses:  Pain of right side of body  Syncope and collapse  POTS (postural orthostatic tachycardia syndrome)    Filed Vitals:   11/21/13 0030  BP: 109/65  Pulse: 88  Temp: 98.2 F (36.8 C)  Resp: 18   Afebrile, NAD, non-toxic appearing, AAOx4.   Patient endorsed a syncopal episode during triaging assessment, there is no collaborating story with nursing and triage staff that this actually occurred.  Plan attempted to give patient pain medication for chronic right-sided pain she proceeded to verbally accost him regarding the pain medication choice.   Patient with 4+/5  strength in right upper and lower extremities, this is baseline for patient. Otherwise no neural focal deficits noted. The rest of the physical examination is unremarkable. EKG normal w/o evidence of pregnancy negative. CBC and BMP are unremarkable. Syncopal episode likely related to her POTS. Pain and symptoms improved on emergency department. Discussed with patient that she will need to follow up with her primary care physician to discuss the chronic nature of her pain further. Advised her to keep her neurology followup. Return precautions discussed. Patient is agreeable to plan. Patient stable at time of discharge.  [place], PA-C 11/21/13 0132

## 2013-11-21 ENCOUNTER — Encounter: Payer: Self-pay | Admitting: Family Medicine

## 2013-11-21 ENCOUNTER — Ambulatory Visit (INDEPENDENT_AMBULATORY_CARE_PROVIDER_SITE_OTHER): Payer: Medicare Other | Admitting: Family Medicine

## 2013-11-21 VITALS — BP 110/66 | HR 96 | Temp 97.6°F | Wt 172.5 lb

## 2013-11-21 DIAGNOSIS — R52 Pain, unspecified: Secondary | ICD-10-CM

## 2013-11-21 DIAGNOSIS — R55 Syncope and collapse: Secondary | ICD-10-CM

## 2013-11-21 DIAGNOSIS — IMO0001 Reserved for inherently not codable concepts without codable children: Secondary | ICD-10-CM

## 2013-11-21 DIAGNOSIS — M797 Fibromyalgia: Secondary | ICD-10-CM

## 2013-11-21 NOTE — Progress Notes (Signed)
Pre visit review using our clinic review tool, if applicable. No additional management support is needed unless otherwise documented below in the visit note. 

## 2013-11-21 NOTE — Assessment & Plan Note (Signed)
Discussed with referral's cooridinator. Appt with [facility] scheduled for this Friday. Pt pleased with plan.

## 2013-11-21 NOTE — ED Provider Notes (Signed)
Medical screening examination/treatment/procedure(s) were performed by non-physician practitioner and as supervising physician I was immediately available for consultation/collaboration.   EKG Interpretation None        Malvin Johns, MD 11/21/13 [phone]

## 2013-11-21 NOTE — Progress Notes (Signed)
   Subjective:   Patient ID: Melissa [name], female    DOB: 17-Apr-1971, 43 y.o.   MRN: [id number]  Melissa [name] is a pleasant 43 y.o. year old female who presents to clinic today with Hospitalization Follow-up  on 11/21/2013  HPI: I saw her for this complaint on 5/28- ordered MRI which was neg and referred to neuro. Percocet also refilled.  Recent cervical spine neg (5/18).  Known h/o fibromyalgia and POTs.  Returned to [facility] ER again last night (6/2)- note reviewed.  CBC, BMET, Troponin unremarkable.  Was sent home and advised to follow up with me.  She is frustrated because she does not have a neuro appt yet.    Does receive weekly IVF infusions for POTs.  Saw her pain doctor last week and was told it was her fibromyalgia- given injection of Toradol which did not help. Also received her lidocaine infusion (for fibro)on Monday which typically helps her fibro but right side of her body is still hurting.  She is frustrated.    No results found for this basename: TSH   No known injury.  Never had anything like this in past.  Percocet helps but does not take it away.      Review of Systems No new hand tremors +bilateral HA No blurred vision  No nausea or vomiting     Objective:    BP 110/66  Pulse 96  Temp(Src) 97.6 F (36.4 C) (Oral)  Wt 172 lb 8 oz (78.245 kg)  SpO2 97%   Physical Exam  Gen: alert, tearful Neuro: Slow gait, no shuffling No facial droop CNII-XII intact Right decreased grip strength (basline_ +mildly increased Patellar reflexes      Assessment & Plan:   Fibromyalgia  Syncope  Pain of right side of body No Follow-up on file.

## 2013-11-21 NOTE — Discharge Instructions (Signed)
Please follow up with your primary care physician in 1-2 days. If you do not have one please call the [place] number listed above. Please follow up with the neurologist at scheduled follow up appointment. Please take your at home pain medications as prescribed by your doctor. Please discuss any further pain medications needed with them. Please read all discharge instructions and return precautions.   Syncope Syncope is a fainting spell. This means the person loses consciousness and drops to the ground. The person is generally unconscious for less than 5 minutes. The person may have some muscle twitches for up to 15 seconds before waking up and returning to normal. Syncope occurs more often in elderly people, but it can happen to anyone. While most causes of syncope are not dangerous, syncope can be a sign of a serious medical problem. It is important to seek medical care.  CAUSES  Syncope is caused by a sudden decrease in blood flow to the brain. The specific cause is often not determined. Factors that can trigger syncope include:  Taking medicines that lower blood pressure.  Sudden changes in posture, such as standing up suddenly.  Taking more medicine than prescribed.  Standing in one place for too long.  Seizure disorders.  Dehydration and excessive exposure to heat.  Low blood sugar (hypoglycemia).  Straining to have a bowel movement.  Heart disease, irregular heartbeat, or other circulatory problems.  Fear, emotional distress, seeing blood, or severe pain. SYMPTOMS  Right before fainting, you may:  Feel dizzy or lightheaded.  Feel nauseous.  See all white or all black in your field of vision.  Have cold, clammy skin. DIAGNOSIS  Your caregiver will ask about your symptoms, perform a physical exam, and perform electrocardiography (ECG) to record the electrical activity of your heart. Your caregiver may also perform other heart or blood tests to  determine the cause of your syncope. TREATMENT  In most cases, no treatment is needed. Depending on the cause of your syncope, your caregiver may recommend changing or stopping some of your medicines. HOME CARE INSTRUCTIONS  Have someone stay with you until you feel stable.  Do not drive, operate machinery, or play sports until your caregiver says it is okay.  Keep all follow-up appointments as directed by your caregiver.  Lie down right away if you start feeling like you might faint. Breathe deeply and steadily. Wait until all the symptoms have passed.  Drink enough fluids to keep your urine clear or pale yellow.  If you are taking blood pressure or heart medicine, get up slowly, taking several minutes to sit and then stand. This can reduce dizziness. SEEK IMMEDIATE MEDICAL CARE IF:   You have a severe headache.  You have unusual pain in the chest, abdomen, or back.  You are bleeding from the mouth or rectum, or you have black or tarry stool.  You have an irregular or very fast heartbeat.  You have pain with breathing.  You have repeated fainting or seizure-like jerking during an episode.  You faint when sitting or lying down.  You have confusion.  You have difficulty walking.  You have severe weakness.  You have vision problems. If you fainted, call your local emergency services (911 in [place] Do not drive yourself to the hospital.  MAKE SURE YOU:  Understand these instructions.  Will watch your condition.  Will get help right away if you are not doing well or get worse. Document Released: 06/07/2005 Document Revised: 12/07/2011  Document Reviewed: 08/06/2011 [facility] Patient Information 2014 [place].

## 2013-11-23 ENCOUNTER — Encounter: Payer: Self-pay | Admitting: Neurology

## 2013-11-23 ENCOUNTER — Ambulatory Visit (INDEPENDENT_AMBULATORY_CARE_PROVIDER_SITE_OTHER): Payer: Medicare Other | Admitting: Neurology

## 2013-11-23 VITALS — BP 103/73 | HR 111 | Temp 97.0°F | Ht 69.0 in | Wt 170.0 lb

## 2013-11-23 DIAGNOSIS — M542 Cervicalgia: Secondary | ICD-10-CM | POA: Insufficient documentation

## 2013-11-23 NOTE — Progress Notes (Signed)
PATIENT: Melissa [name] DOB: 1970/08/10  HISTORICAL  MELITTA TIGUE physical 43 years old right-handed Caucasian female, accompanied by her husband, referred by her primary care physician Dr. Deborra Medina for evaluation of severe pain involving her right arm, and leg.  She has past medical history of bipolar disorder, fibromyalgia for more than 20 years, has been on chronic polypharmacy treatment, including, pain, Neurontin, tizanidine, Topamax for migraine prevention, trazodone, Effexor,   In Nov 02 2013, without clear trigger, while riding the car, she began to complain right arm, right leg pain, getting worse over the past few weeks, now also involving the right neck, constant, deep achy pain,  She has to take out of recommended dose of tizanidine 4 mg 3 tablets at one time, also oxycodone, Tylenol, for her pain, she has tried Neurontin, tramadol, without helping her symptoms,  MRI brain reviewed in April 2015 at [facility], that was normal, x-ray of cervical showed mild spondylitic changes.  REVIEW OF SYSTEMS: Full 14 system review of systems performed and notable only for fatigue, blurred vision, diarrhea, urination problem, achy muscles, memory loss, confusion, weakness, dizziness, passing out, anxiety, change in appetite, disinterested in activities  ALLERGIES: Allergies  Allergen Reactions  . Azithromycin     rash  . Depakote [Divalproex Sodium]     Hair loss  . Lithium     Rash  . Penicillins     rash  . Seroquel [Quetiapine Fumarate]     irritable  . Sulfa Antibiotics     Rash  . Pregabalin Palpitations    Manic symptoms, no sleep x's 3 days, no appetite.    HOME MEDICATIONS: Current Outpatient Prescriptions on File Prior to Visit  Medication Sig Dispense Refill  . alendronate (FOSAMAX) 70 MG tablet Take 70 mg by mouth once a week. Take with a full glass of water on an empty stomach. On saturdays      . cholecalciferol (VITAMIN D) 400 UNITS TABS tablet Take 3,000  Units by mouth daily.       . clonazePAM (KLONOPIN) 1 MG tablet Take 1 mg by mouth 3 (three) times daily as needed for anxiety (anxiety).      . fexofenadine (ALLEGRA) 180 MG tablet Take 180 mg by mouth daily.      Marland Kitchen gabapentin (NEURONTIN) 300 MG capsule TAKE TWO CAPSULES TWICE A DAY  120 capsule  0  . Lurasidone HCl (LATUDA) 20 MG TABS Take 1 tablet by mouth daily.      . medroxyPROGESTERone (DEPO-PROVERA) 150 MG/ML injection Inject 150 mg into the muscle every 3 (three) months.      Marland Kitchen oxyCODONE-acetaminophen (PERCOCET/ROXICET) 5-325 MG per tablet Take 1 tablet by mouth every 6 (six) hours as needed for severe pain.  30 tablet  0  . promethazine (PHENERGAN) 25 MG tablet Take 1 tablet (25 mg total) by mouth every 8 (eight) hours as needed for nausea or vomiting.  15 tablet  0  . ranitidine (ZANTAC) 150 MG tablet Take 150 mg by mouth daily.      Marland Kitchen tiZANidine (ZANAFLEX) 4 MG tablet TAKE 1 TABLET EVERY 8 HOURS AS NEEDED FOR MUSCLE SPASMS.  30 tablet  0  . topiramate (TOPAMAX) 50 MG tablet Take 100 mg by mouth daily.      . traZODone (DESYREL) 100 MG tablet Take 200 mg by mouth at bedtime.       Marland Kitchen venlafaxine XR (EFFEXOR-XR) 75 MG 24 hr capsule Take 150 mg by mouth every morning.      Marland Kitchen  vitamin B-12 (CYANOCOBALAMIN) 1000 MCG tablet Take 1,000 mcg by mouth daily.         PAST MEDICAL HISTORY: Past Medical History  Diagnosis Date  . Bipolar disorder   . Fibromyalgia     on disability  . Migraine   . Panic attack   . Muscle spasm   . Uterine perforation     hx of  . Syncope   . POTS (postural orthostatic tachycardia syndrome)     PAST SURGICAL HISTORY: Past Surgical History  Procedure Laterality Date  . Dilation and curettage of uterus    . Cataract extraction  dec 2014  . Lasik  dec 2014    FAMILY HISTORY: Family History  Problem Relation Age of Onset  . Diabetes Mother   . Hypertension Mother   . Cancer Father     brain tumor  . Cancer Maternal Grandmother   . Cancer  Maternal Grandfather   . Cancer Paternal Grandmother   . Cancer Paternal Grandfather     SOCIAL HISTORY:  History   Social History  . Marital Status: Married    Spouse Name: Izell Wetherington    Number of Children: 0  . Years of Education: HS/college   Occupational History    disabled   Social History Main Topics  . Smoking status: Never Smoker   . Smokeless tobacco: Never Used  . Alcohol Use: No     Comment: 1 holiday drink yearly  . Drug Use: No  . Sexual Activity: Not on file   Other Topics Concern  . Not on file   Social History Narrative   Married 20+ years   From [place]   On disability for fibromyalgia   Caffeine Use: 1 soda daily     PHYSICAL EXAM   Filed Vitals:   11/23/13 0841  BP: 103/73  Pulse: 111  Temp: 97 F (36.1 C)  TempSrc: Oral  Height: 5\' 9"  (1.753 m)  Weight: 170 lb (77.111 kg)    Body mass index is 25.09 kg/(m^2).   Generalized: In no acute distress  Neck: Supple, no carotid bruits   Cardiac: Regular rate rhythm  Pulmonary: Clear to auscultation bilaterally  Musculoskeletal: No deformity  Neurological examination  Mentation: Alert oriented to time, place, history taking, and causual conversation  Cranial nerve II-XII: Pupils were equal round reactive to light. Extraocular movements were full.  Visual field were full on confrontational test. Bilateral fundi were sharp.  Facial sensation and strength were normal. Hearing was intact to finger rubbing bilaterally. Uvula tongue midline.  Head turning and shoulder shrug and were normal and symmetric.Tongue protrusion into cheek strength was normal.  Motor: Normal tone, bulk and strength.  Sensory: Intact to fine touch, pinprick, preserved vibratory sensation, and proprioception at toes.  Coordination: Normal finger to nose, heel-to-shin bilaterally there was no truncal ataxia  Gait: Rising up from seated position without assistance, normal stance, without trunk ataxia, moderate stride,  good arm swing, smooth turning, able to perform tiptoe, and heel walking without difficulty.   Romberg signs: Negative  Deep tendon reflexes: Brachioradialis 2/2, biceps 2/2, triceps 2/2, patellar 2/2, Achilles 2/2, plantar responses were flexor bilaterally.   DIAGNOSTIC DATA (LABS, IMAGING, TESTING) - I reviewed patient records, labs, notes, testing and imaging myself where available.  Lab Results  Component Value Date   WBC 8.3 11/20/2013   HGB 12.6 11/20/2013   HCT 37.8 11/20/2013   MCV 87.1 11/20/2013   PLT 239 11/20/2013      Component Value  Date/Time   NA 140 11/20/2013 2050   K 4.4 11/20/2013 2050   CL 110 11/20/2013 2050   CO2 21 11/20/2013 2050   GLUCOSE 86 11/20/2013 2050   BUN 10 11/20/2013 2050   CREATININE 1.10 11/20/2013 2050   CALCIUM 8.7 11/20/2013 2050   PROT 6.4 11/15/2013 0809   ALBUMIN 3.8 11/15/2013 0809   AST 23 11/15/2013 0809   ALT 24 11/15/2013 0809   ALKPHOS 35* 11/15/2013 0809   BILITOT 0.5 11/15/2013 0809   GFRNONAA 60* 11/20/2013 2050   GFRAA 70* 11/20/2013 2050   Lab Results  Component Value Date   CHOL 245* 02/26/2013   HDL 34.20* 02/26/2013   LDLDIRECT 107.8 02/26/2013   TRIG 742.0* 02/26/2013   CHOLHDL 7 02/26/2013   No results found for this basename: HGBA1C   Lab Results  Component Value Date   VITAMINB12 202* 11/15/2013   ASSESSMENT AND PLAN  SITLALI KOERNER is a 43 y.o. female complains of  right-sided neck pain, right arm, neck achy pain, essentially normal neurological examination, normal MRI of brain,  1, need to rule out right cervical pathology, MRI of cervical, EMG nerve conduction study  2. She is already on polypharmacy treatment, tends to take medicine out of recommended ranges, I will refer her to pain management  Marcial Pacas, M.D. Ph.D.  [facility] [address] [place], [place] [address] [phone]

## 2013-11-28 ENCOUNTER — Other Ambulatory Visit: Payer: Self-pay

## 2013-11-28 ENCOUNTER — Encounter: Payer: Self-pay | Admitting: Neurology

## 2013-11-28 ENCOUNTER — Emergency Department (HOSPITAL_COMMUNITY)
Admission: EM | Admit: 2013-11-28 | Discharge: 2013-11-28 | Disposition: A | Discharge status: Home / Self Care | Payer: Medicare Other | Attending: Emergency Medicine | Admitting: Emergency Medicine

## 2013-11-28 ENCOUNTER — Encounter (HOSPITAL_COMMUNITY): Payer: Self-pay | Admitting: Emergency Medicine

## 2013-11-28 DIAGNOSIS — Z79899 Other long term (current) drug therapy: Secondary | ICD-10-CM | POA: Insufficient documentation

## 2013-11-28 DIAGNOSIS — N390 Urinary tract infection, site not specified: Secondary | ICD-10-CM

## 2013-11-28 DIAGNOSIS — G8929 Other chronic pain: Secondary | ICD-10-CM | POA: Insufficient documentation

## 2013-11-28 DIAGNOSIS — IMO0001 Reserved for inherently not codable concepts without codable children: Secondary | ICD-10-CM | POA: Insufficient documentation

## 2013-11-28 DIAGNOSIS — G43909 Migraine, unspecified, not intractable, without status migrainosus: Secondary | ICD-10-CM | POA: Diagnosis not present

## 2013-11-28 DIAGNOSIS — Z3202 Encounter for pregnancy test, result negative: Secondary | ICD-10-CM | POA: Insufficient documentation

## 2013-11-28 DIAGNOSIS — F319 Bipolar disorder, unspecified: Secondary | ICD-10-CM | POA: Diagnosis not present

## 2013-11-28 DIAGNOSIS — M62838 Other muscle spasm: Secondary | ICD-10-CM | POA: Insufficient documentation

## 2013-11-28 DIAGNOSIS — R000 Tachycardia, unspecified: Secondary | ICD-10-CM | POA: Diagnosis not present

## 2013-11-28 DIAGNOSIS — R404 Transient alteration of awareness: Secondary | ICD-10-CM | POA: Diagnosis present

## 2013-11-28 DIAGNOSIS — Z880 Allergy status to penicillin: Secondary | ICD-10-CM | POA: Insufficient documentation

## 2013-11-28 DIAGNOSIS — F410 Panic disorder [episodic paroxysmal anxiety] without agoraphobia: Secondary | ICD-10-CM | POA: Diagnosis not present

## 2013-11-28 DIAGNOSIS — Z9889 Other specified postprocedural states: Secondary | ICD-10-CM | POA: Diagnosis not present

## 2013-11-28 DIAGNOSIS — R55 Syncope and collapse: Secondary | ICD-10-CM

## 2013-11-28 HISTORY — DX: Pain, unspecified: R52

## 2013-11-28 LAB — URINALYSIS, ROUTINE W REFLEX MICROSCOPIC
Bilirubin Urine: NEGATIVE
Glucose, UA: NEGATIVE mg/dL
Ketones, ur: NEGATIVE mg/dL
Nitrite: NEGATIVE
Protein, ur: NEGATIVE mg/dL
Specific Gravity, Urine: 1.015 (ref 1.005–1.030)
Urobilinogen, UA: 0.2 mg/dL (ref 0.0–1.0)
pH: 5.5 (ref 5.0–8.0)

## 2013-11-28 LAB — BASIC METABOLIC PANEL
BUN: 9 mg/dL (ref 6–23)
CO2: 17 meq/L — AB (ref 19–32)
Calcium: 9.6 mg/dL (ref 8.4–10.5)
Chloride: 102 mEq/L (ref 96–112)
Creatinine, Ser: 1.04 mg/dL (ref 0.50–1.10)
GFR calc Af Amer: 75 mL/min — ABNORMAL LOW (ref >90)
GFR, EST NON AFRICAN AMERICAN: 64 mL/min — AB (ref >90)
GLUCOSE: 98 mg/dL (ref 70–99)
POTASSIUM: 3.9 meq/L (ref 3.7–5.3)
SODIUM: 138 meq/L (ref 137–147)

## 2013-11-28 LAB — I-STAT TROPONIN, ED: Troponin i, poc: 0 ng/mL (ref 0.00–0.08)

## 2013-11-28 LAB — CBC
HCT: 41.5 % (ref 36.0–46.0)
HEMOGLOBIN: 14.2 g/dL (ref 12.0–15.0)
MCH: 29.3 pg (ref 26.0–34.0)
MCHC: 34.2 g/dL (ref 30.0–36.0)
MCV: 85.6 fL (ref 78.0–100.0)
Platelets: 253 10*3/uL (ref 150–400)
RBC: 4.85 MIL/uL (ref 3.87–5.11)
RDW: 13.6 % (ref 11.5–15.5)
WBC: 8.6 10*3/uL (ref 4.0–10.5)

## 2013-11-28 LAB — URINE MICROSCOPIC-ADD ON

## 2013-11-28 LAB — PREGNANCY, URINE: Preg Test, Ur: NEGATIVE

## 2013-11-28 LAB — CK: Total CK: 75 U/L (ref 7–177)

## 2013-11-28 MED ORDER — OXYCODONE-ACETAMINOPHEN 5-325 MG PO TABS: 1.0000 | ORAL_TABLET | Freq: Four times a day (QID) | ORAL | Status: DC | PRN

## 2013-11-28 MED ORDER — CIPROFLOXACIN HCL 500 MG PO TABS: 500.0000 mg | ORAL_TABLET | Freq: Once | ORAL | Status: DC

## 2013-11-28 MED ORDER — LEVOFLOXACIN 750 MG PO TABS
750.0000 mg | ORAL_TABLET | Freq: Once | ORAL | Status: AC
  Administered 2013-11-28: 750 mg via ORAL
  Filled 2013-11-28: qty 1

## 2013-11-28 MED ORDER — SODIUM CHLORIDE 0.9 % IV BOLUS (SEPSIS)
2000.0000 mL | Freq: Once | INTRAVENOUS | Status: AC
  Administered 2013-11-28: 2000 mL via INTRAVENOUS

## 2013-11-28 MED ORDER — OXYCODONE-ACETAMINOPHEN 5-325 MG PO TABS
2.0000 | ORAL_TABLET | Freq: Once | ORAL | Status: AC
Start: 2013-11-28 — End: 2013-11-28
  Administered 2013-11-28: 2 via ORAL
  Filled 2013-11-28: qty 2

## 2013-11-28 MED ORDER — SODIUM CHLORIDE 0.9 % IV BOLUS (SEPSIS)
1000.0000 mL | Freq: Once | INTRAVENOUS | Status: AC
  Administered 2013-11-28: 1000 mL via INTRAVENOUS

## 2013-11-28 MED ORDER — LEVOFLOXACIN 750 MG PO TABS: 750.0000 mg | ORAL_TABLET | Freq: Every day | ORAL | Status: DC

## 2013-11-28 NOTE — Telephone Encounter (Signed)
Pt request rx oxycodone apap. Call when ready for pick up. Pt has 2 tabs left; pt said Dr Deborra Medina aware of her situation; pt said taking more than usual due to rt side pain.

## 2013-11-28 NOTE — Progress Notes (Signed)
This encounter was created in error - please disregard.

## 2013-11-28 NOTE — Telephone Encounter (Signed)
Spoke to pt and informed her Rx is available at the front desk for pickup; advised a govt issued photo id required for pickup

## 2013-11-28 NOTE — ED Notes (Signed)
Per pt was at the doctor office with her husband and was standing in line and passed out. denies hitting head. Per husband pt slid at the store. Per pt sts HA. sts she hasn't been drinking a lot and possibly dehydrated.

## 2013-11-28 NOTE — ED Provider Notes (Signed)
CSN: [id number]     Arrival date & time 11/28/13  1455 History   First MD Initiated Contact with Patient 11/28/13 1614     Chief Complaint  Patient presents with  . Dehydration  . Loss of Consciousness     HPI Pt was seen at 1615. Per pt and her husband, c/o sudden onset and resolution of 2 brief episodes of syncope that occurred today PTA. Pt was standing in line at the doctor's office with her husband when her husband states she "very slowly and gracefully fell to the floor onto her side." Pt's husband states pt did not hit her head. Pt's husband states the office staff woke pt up with an ammonia inhalant. The office staff helped lift the pt into a wheelchair when she "passed out again." Pt again was arousable to an ammonia inhalant. Pt states she has hx frequent episodes of syncope due to dx POTS. Endorses she has not been drinking "my usual 5 to 7 liters of fluid a day like I'm supposed to" for the past 1 to 2 days. Denies prodromal symptoms before syncope. Denies CP/palpitations, no SOB/cough, no abd pain, no N/V/D, no focal motor weakness, no tingling/numbness in extremities. Pt is requesting her usual dose of percocet on arrival to the ED "because I missed my dose today." Pt states she is taking percocet for her chronic right UE and LE "pain." States these "pains" are currently being worked up as outpatient by her PMD and Neuro MD. Denies any change in her usual chronic pain today.     Past Medical History  Diagnosis Date  . Bipolar disorder   . Fibromyalgia     on disability  . Migraine   . Panic attack   . Muscle spasm   . Uterine perforation     hx of  . Syncope     recurrent  . POTS (postural orthostatic tachycardia syndrome)   . Pain of right side of body     chronic   Past Surgical History  Procedure Laterality Date  . Dilation and curettage of uterus    . Cataract extraction  dec 2014  . Lasik  dec 2014   Family History  Problem Relation Age of Onset  . Diabetes  Mother   . Hypertension Mother   . Cancer Father     brain tumor  . Cancer Maternal Grandmother   . Cancer Maternal Grandfather   . Cancer Paternal Grandmother   . Cancer Paternal Grandfather    History  Substance Use Topics  . Smoking status: Never Smoker   . Smokeless tobacco: Never Used  . Alcohol Use: No     Comment: 1 holiday drink yearly    Review of Systems ROS: Statement: All systems negative except as marked or noted in the HPI; Constitutional: Negative for fever and chills. ; ; Eyes: Negative for eye pain, redness and discharge. ; ; ENMT: Negative for ear pain, hoarseness, nasal congestion, sinus pressure and sore throat. ; ; Cardiovascular: Negative for chest pain, palpitations, diaphoresis, dyspnea and peripheral edema. ; ; Respiratory: Negative for cough, wheezing and stridor. ; ; Gastrointestinal: Negative for nausea, vomiting, diarrhea, abdominal pain, blood in stool, hematemesis, jaundice and rectal bleeding. . ; ; Genitourinary: Negative for dysuria, flank pain and hematuria. ; ; Musculoskeletal: Negative for back pain and neck pain. Negative for swelling and deformity.; ; Skin: Negative for pruritus, rash, abrasions, blisters, bruising and skin lesion.; ; Neuro: Negative for headache, lightheadedness and neck stiffness. Negative  for weakness, altered level of consciousness , altered mental status, extremity weakness, paresthesias, involuntary movement, seizure and +syncope.      Allergies  Azithromycin; Depakote; Lithium; Penicillins; Seroquel; Sulfa antibiotics; and Pregabalin  Home Medications   Prior to Admission medications   Medication Sig Start Date End Date Taking? Authorizing Provider  alendronate (FOSAMAX) 70 MG tablet Take 70 mg by mouth once a week. Take with a full glass of water on an empty stomach. On saturdays   Yes Historical Provider, MD  cholecalciferol (VITAMIN D) 1000 UNITS tablet Take 3,000 Units by mouth daily.   Yes Historical Provider, MD   clonazePAM (KLONOPIN) 1 MG tablet Take 1 mg by mouth 3 (three) times daily as needed for anxiety (anxiety).   Yes Historical Provider, MD  fexofenadine (ALLEGRA) 180 MG tablet Take 180 mg by mouth daily.   Yes Historical Provider, MD  gabapentin (NEURONTIN) 300 MG capsule Take 600 mg by mouth 2 (two) times daily.   Yes Historical Provider, MD  lurasidone (LATUDA) 40 MG TABS tablet Take 40 mg by mouth daily with breakfast.   Yes Historical Provider, MD  medroxyPROGESTERone (DEPO-PROVERA) 150 MG/ML injection Inject 150 mg into the muscle every 3 (three) months.   Yes Historical Provider, MD  oxyCODONE-acetaminophen (PERCOCET/ROXICET) 5-325 MG per tablet Take 1 tablet by mouth every 6 (six) hours as needed for severe pain. 11/28/13  Yes Lucille Passy, MD  promethazine (PHENERGAN) 25 MG tablet Take 1 tablet (25 mg total) by mouth every 8 (eight) hours as needed for nausea or vomiting. 09/25/13  Yes Resa Miner Lawyer, PA-C  ranitidine (ZANTAC) 150 MG tablet Take 150 mg by mouth daily.   Yes Historical Provider, MD  tiZANidine (ZANAFLEX) 4 MG tablet Take 4 mg by mouth every 8 (eight) hours as needed for muscle spasms.   Yes Historical Provider, MD  topiramate (TOPAMAX) 50 MG tablet Take 100 mg by mouth daily.   Yes Historical Provider, MD  traZODone (DESYREL) 100 MG tablet Take 200 mg by mouth at bedtime.    Yes Historical Provider, MD  venlafaxine XR (EFFEXOR-XR) 75 MG 24 hr capsule Take 225 mg by mouth every morning.    Yes Historical Provider, MD  vitamin B-12 (CYANOCOBALAMIN) 1000 MCG tablet Take 1,000 mcg by mouth daily.   Yes Historical Provider, MD   BP 116/90  Pulse 112  Temp(Src) 98.6 F (37 C) (Oral)  Resp 20  SpO2 97%  Filed Vitals:   11/28/13 1830 11/28/13 1854 11/28/13 1900 11/28/13 2003  BP: 116/67 121/55 121/64 113/75  Pulse: 93 102 102   Temp:  98.4 F (36.9 C)  98.6 F (37 C)  TempSrc:  Oral  Oral  Resp: 18 24 22 18   SpO2: 100% 100% 100% 98%     Physical  Exam 1620: Physical examination:  Nursing notes reviewed; Vital signs and O2 SAT reviewed;  Constitutional: Well developed, Well nourished, Well hydrated, In no acute distress; Head:  Normocephalic, atraumatic; Eyes: EOMI, PERRL, No scleral icterus; ENMT: Mouth and pharynx normal, Mucous membranes moist; Neck: Supple, Full range of motion, No lymphadenopathy; Cardiovascular: Tachycardic rate and rhythm, No gallop; Respiratory: Breath sounds clear & equal bilaterally, No wheezes.  Speaking full sentences with ease, Normal respiratory effort/excursion; Chest: Nontender, Movement normal; Abdomen: Soft, Nontender, Nondistended, Normal bowel sounds; Genitourinary: No CVA tenderness; Extremities: Pulses normal, No tenderness, No edema, No calf edema or asymmetry.; Neuro: AA&Ox3, Major CN grossly intact. No facial droop. Speech clear. No gross focal motor or sensory  deficits in extremities. Climbs on and off stretcher easily by herself. Gait steady.; Skin: Color normal, Warm, Dry.    ED Course  Procedures     EKG Interpretation   Date/Time:  Wednesday November 28 2013 15:12:57 EDT Ventricular Rate:  127 PR Interval:  142 QRS Duration: 72 QT Interval:  294 QTC Calculation: 427 R Axis:   134 Text Interpretation:  Sinus tachycardia Baseline wander Possible Left  atrial enlargement Right axis deviation Cannot rule out Anterior infarct ,  age undetermined Abnormal ECG When compared with ECG of 11/20/2013 Rate  faster Confirmed by [facility]  MD, Nunzio Cory [phone]) on 11/28/2013 4:28:47 PM      MDM  MDM Reviewed: previous chart, nursing note and vitals Reviewed previous: labs and ECG Interpretation: labs and ECG    Results for orders placed during the hospital encounter of 11/28/13  CBC      Result Value Ref Range   WBC 8.6  4.0 - 10.5 K/uL   RBC 4.85  3.87 - 5.11 MIL/uL   Hemoglobin 14.2  12.0 - 15.0 g/dL   HCT 41.5  36.0 - 46.0 %   MCV 85.6  78.0 - 100.0 fL   MCH 29.3  26.0 - 34.0 pg   MCHC  34.2  30.0 - 36.0 g/dL   RDW 13.6  11.5 - 15.5 %   Platelets 253  150 - 400 K/uL  BASIC METABOLIC PANEL      Result Value Ref Range   Sodium 138  137 - 147 mEq/L   Potassium 3.9  3.7 - 5.3 mEq/L   Chloride 102  96 - 112 mEq/L   CO2 17 (*) 19 - 32 mEq/L   Glucose, Bld 98  70 - 99 mg/dL   BUN 9  6 - 23 mg/dL   Creatinine, Ser 1.04  0.50 - 1.10 mg/dL   Calcium 9.6  8.4 - 10.5 mg/dL   GFR calc non Af Amer 64 (*) >90 mL/min   GFR calc Af Amer 75 (*) >90 mL/min  URINALYSIS, ROUTINE W REFLEX MICROSCOPIC      Result Value Ref Range   Color, Urine YELLOW  YELLOW   APPearance CLOUDY (*) CLEAR   Specific Gravity, Urine 1.015  1.005 - 1.030   pH 5.5  5.0 - 8.0   Glucose, UA NEGATIVE  NEGATIVE mg/dL   Hgb urine dipstick SMALL (*) NEGATIVE   Bilirubin Urine NEGATIVE  NEGATIVE   Ketones, ur NEGATIVE  NEGATIVE mg/dL   Protein, ur NEGATIVE  NEGATIVE mg/dL   Urobilinogen, UA 0.2  0.0 - 1.0 mg/dL   Nitrite NEGATIVE  NEGATIVE   Leukocytes, UA MODERATE (*) NEGATIVE  PREGNANCY, URINE      Result Value Ref Range   Preg Test, Ur NEGATIVE  NEGATIVE  CK      Result Value Ref Range   Total CK 75  7 - 177 U/L  URINE MICROSCOPIC-ADD ON      Result Value Ref Range   Squamous Epithelial / LPF MANY (*) RARE   WBC, UA 11-20  <3 WBC/hpf   RBC / HPF 0-2  <3 RBC/hpf   Bacteria, UA FEW (*) RARE   Urine-Other MUCOUS PRESENT    I-STAT TROPOININ, ED      Result Value Ref Range   Troponin i, poc 0.00  0.00 - 0.08 ng/mL   Comment 3              1945:  Pt states she feels "better now" and  wants to go home. Tachycardia has improved after IVF. +UTI, UC pending; pt with multiple drug allergies, will dose levaquin. Dx and testing d/w pt and family.  Questions answered.  Verb understanding, agreeable to d/c home with outpt f/u.      Alfonzo Feller, DO 11/30/13 1709

## 2013-11-28 NOTE — Discharge Instructions (Signed)
°Emergency Department Resource Guide °1) Find a Doctor and Pay Out of Pocket °Although you won't have to find out who is covered by your insurance plan, it is a good idea to ask around and get recommendations. You will then need to call the office and see if the doctor you have chosen will accept you as a new patient and what types of options they offer for patients who are self-pay. Some doctors offer discounts or will set up payment plans for their patients who do not have insurance, but you will need to ask so you aren't surprised when you get to your appointment. ° °2) Contact Your Local Health Department °Not all health departments have doctors that can see patients for sick visits, but many do, so it is worth a call to see if yours does. If you don't know where your local health department is, you can check in your phone book. The CDC also has a tool to help you locate your state's health department, and many state websites also have listings of all of their local health departments. ° °3) Find a Walk-in Clinic °If your illness is not likely to be very severe or complicated, you may want to try a walk in clinic. These are popping up all over the country in pharmacies, drugstores, and shopping centers. They're usually staffed by nurse practitioners or physician assistants that have been trained to treat common illnesses and complaints. They're usually fairly quick and inexpensive. However, if you have serious medical issues or chronic medical problems, these are probably not your best option. ° °No Primary Care Doctor: °- Call [facility] Connect at  832-8000 - they can help you locate a primary care doctor that  accepts your insurance, provides certain services, etc. °- Physician Referral Service- 1-800-533-3463 ° °Chronic Pain Problems: °Organization         Address  Phone   Notes  °[facility]  (336) 297-2271 Patients need to be referred by their primary care doctor.  ° °Medication  Assistance: °Organization         Address  Phone   Notes  °[place] [facility] [address] °[place], [place] [address] (336) 641-8030 --Must be a resident of [place] °-- Must have NO insurance coverage whatsoever (no Medicaid/ Medicare, etc.) °-- The pt. MUST have a primary care doctor that directs their care regularly and follows them in the community °  °[facility]  (866) 331-1348   °[employer]  (888) 892-1162   ° °Agencies that provide inexpensive medical care: °Organization         Address  Phone   Notes  °[facility]  (336) 832-8035   °[facility]    (336) 832-7272   °[facility] [address] °[place], [place] [address] (336) 832-4777   °[facility] [address], °[place] (336) 271-4999   °[facility]    (336) 373-0678   °[facility]    (336) 272-1050   °[facility] ° [address], [place] Phone:  (336) 832-4444, Fax:  (336) 832-4440 Hours of Operation:  9 am - 6 pm, M-F.  Also accepts Medicaid/Medicare and self-pay.  °[facility] ° [address],  Phone: (336) 832-3150, Fax: (336) 832-3151. Hours of Operation:  8:30 am - 5:30 pm, M-F.  Also accepts Medicaid and self-pay.  °[facility] [place] [address], [place] Phone: (336) 878-6027   °[facility] [address], [place], [place] (336)723-1848, Ext. 123 Mondays & Thursdays: 7-9 AM.  First 15 patients are seen on a first come, first serve basis. °  ° °Medicaid-accepting [place] Providers: ° °Organization         Address  Phone   Notes  °[facility] [address], [place] (336) 641-2100 Also accepts self-pay patients.  °[facility] [address], [place] ° (336) 856-9996   °[facility] [address], [place]  (336) 288-8857   °[facility] [address] [place] [address], [place] (336) 299-7000   °Veita Bland [address], [place]  ° (336) 373-1557 Only accepts [place] Access Medicaid patients after they have their name applied to their card.  ° °Self-Pay (no insurance) in [place]: ° °Organization         Address  Phone   Notes  °Sickle Cell Patients, [facility] [address], [place] (336) 832-1970   °[facility] [address], [place] (336) 832-4400   °[facility] [place] ° [address], [place] (336) 992-4800   °[facility]/Dr. Osei-Bonsu ° [address] [place] [address], [place] or [address], [place] (336) 841-8500 Phone number for both [place] and [place] locations is the same.  °[facility] [address], [place] (336) 299-0000   °[facility] [address] [place] [address], [place] or [address] (336) 852-7530 °(336) 878-2260   °[facility] [address], [place] (336) 350-1642, phone; (336) 294-5005, fax Sees patients 1st and 3rd Saturday of every month.  Must not qualify for public or private insurance (i.e. Medicaid, Medicare, [place] Health Choice, Veterans' Benefits) • Household income should be no more than 200% of the poverty level •The clinic cannot treat you if you are pregnant or think you are pregnant • Sexually transmitted diseases are not treated at the clinic.  ° ° °Dental Care: °Organization         Address  Phone  Notes  °[place] [facility] [address], [place] (336) 641-6152 Accepts children up to age 21 who are enrolled in Medicaid or [place] Health Choice; pregnant women with a Medicaid card; and children who have applied for Medicaid or [place] Health Choice, but were declined, whose parents can pay a reduced fee at time of service.  °[place]  [facility] [place]  [address], [place] (336) 641-7733 Accepts children up to age 21 who are enrolled in Medicaid or [place] Health Choice; pregnant women with a Medicaid card; and children who have applied for Medicaid or [place] Health Choice, but were declined, whose parents can pay a reduced fee at time of service.  °[facility] ° [address], [place] (336) 641-4533 Patients are seen by appointment only. Walk-ins are not accepted. [facility] will see patients 18 years of age and older. °Monday - Tuesday (8am-5pm) °Most Wednesdays (8:30-5pm) °$30 per visit, cash only  °[facility] ° [address], [place] (336) 641-4533 Patients are seen by appointment only. Walk-ins are not accepted. [facility] will see patients 18 years of age and older. °One   Wednesday Evening (Monthly: Volunteer Based).  $30 per visit, cash only  °[facility]  (919) 537-3737 for adults; Children under age 4, call Graduate Pediatric Dentistry at (919) [phone]. Children aged 4-14, please call (919) [phone] to request a pediatric application. ° Dental services are provided in all areas of dental care including fillings, crowns and bridges, complete and partial dentures, implants, gum treatment, root canals, and extractions. Preventive care is also provided. Treatment is provided to both adults and children. °Patients are selected via a lottery and there is often a waiting list. °  °[facility] [address], °[place] ° (336) 763-8833 www.drcivils.com °  °[facility] [address], [place], [place] (336)723-1848, Ext. 123 Second and Fourth Thursday of each month, opens at 6:30 AM; Clinic ends at 9 AM.  Patients are seen on a first-come first-served basis, and a limited number are seen during each clinic.  ° °[facility] ° [address], [place], [place] (336) 723-7904    Eligibility Requirements °You must have lived in [place], [place], or [place] counties for at least the last three months. °  You cannot be eligible for state or federal sponsored healthcare insurance, including Veterans Administration, Medicaid, or Medicare. °  You generally cannot be eligible for healthcare insurance through your employer.  °  How to apply: °Eligibility screenings are held every Tuesday and Wednesday afternoon from 1:00 pm until 4:00 pm. You do not need an appointment for the interview!  °[facility] [address], [place], [place] 336-631-2330   °[facility]  336-342-8273   °[facility]  336-703-3100   °[facility]  336-570-6415   ° °Behavioral Health Resources in the Community: °Intensive Outpatient Programs °Organization         Address  Phone  Notes  °[facility] [address], [place], [place] 336-878-6098   °[facility] [address], [place], [place] 336-832-9800   °[facility] [address], [place], [place] ° 336-882-2125   °[facility] [address],  °[place], [place] 1-800-853-5163 or 336-641-4981   °Substance Abuse Resources °Organization         Address  Phone  Notes  °[facility]  336-882-2125   °[facility]  336-784-9470   °[facility]  336-285-9073   °[facility]  336-845-3988   °Residential & Outpatient Substance Abuse Program  1-800-659-3381   °Psychological Services °Organization         Address  Phone  Notes  °[facility]  336- 832-9600   °[facility]  336- 378-7881   °[facility] [address], [place] 1-800-853-5163 or 336-641-4981   ° °Mobile Crisis Teams °Organization         Address  Phone  Notes  °[facility], Mobile Crisis Care Unit  1-877-626-1772   °Assertive °[facility] ° [address]  [place], [place] 336-834-9664   °Sharon DeEsch [address] °[place] 336-554-5454   ° °Self-Help/Support Groups °Organization         Address  Phone             Notes  °[facility]  - variety of support groups  336- 373-1402 Call for more information  °Narcotics Anonymous (NA), [facility] [address], °[place]  2 meetings at this location  ° °  Residential Treatment Programs Organization         Address  Phone  Notes  [facility] [address]    [place]  1-450-753-0124   [facility]  [address] [place] [address], [place], [place]   [place], [place] 424-230-6403 Admissions: 8am-3pm M-F  [facility] [place] [address]    [place], [place] 882-800-3491   [facility] [address] [place], [place], [place]   [facility] [place] [address]  [place], [place]   [facility] Intensive Outpatient [address], [place], [place]   [facility] [address]  [place], [place] 1-905-584-0395 or 251-335-0380   [facility] ([facility] [address] [place], [place] Accepts Medicaid  [facility] [address]  [place] 1-(902)750-2269 Substance Abuse/Addiction Treatment   [facility] Organization         Address  Phone  Notes  [facility]  6096592606   Domenic Schwab, PhD [address] [place], [place]   [phone] or 276-742-6300   [place], [place] [phone]   [facility] [address], [place], [place] [phone] Insurance/Medicaid/sponsorship through [facility] [address]                                    [place], [place] (857) [phone] [facility] [address], [place] [phone]    Dr. Adele Schilder  [phone]   [facility] Dept. 1) [address], [place] 2) [place] 3)  [address], [place] [phone] [phone])[phone]  [phone]   [place] 415-662-3143 or 7862793737 (After Hours)       Take the prescription as directed.  Call your regular medical doctor tomorrow morning to schedule a follow up appointment within the next 2 days.  Return to the Emergency Department immediately sooner if worsening.

## 2013-11-29 ENCOUNTER — Telehealth: Payer: Self-pay | Admitting: *Deleted

## 2013-11-29 ENCOUNTER — Ambulatory Visit
Admission: RE | Admit: 2013-11-29 | Discharge: 2013-11-29 | Disposition: A | Discharge status: Home / Self Care | Payer: Medicare Other | Source: Ambulatory Visit | Attending: Neurology | Admitting: Neurology

## 2013-11-29 DIAGNOSIS — M542 Cervicalgia: Secondary | ICD-10-CM

## 2013-11-29 NOTE — Telephone Encounter (Signed)
I called and spoke to [place], husband.  Follow-up phone call after syncope episodes (2) yesterday in office. Pt did go to ER as recommended.  Was evaluated.  Given 3 liter of IVF, was dehydrated, also UTI.  Started on abx.  Is feeling better today. Has f/u appt with Dr. Aleen Sells at [facility].

## 2013-11-30 ENCOUNTER — Other Ambulatory Visit (INDEPENDENT_AMBULATORY_CARE_PROVIDER_SITE_OTHER): Payer: Medicare Other

## 2013-11-30 DIAGNOSIS — N289 Disorder of kidney and ureter, unspecified: Secondary | ICD-10-CM

## 2013-11-30 LAB — BASIC METABOLIC PANEL
BUN: 11 mg/dL (ref 6–23)
CHLORIDE: 111 meq/L (ref 96–112)
CO2: 22 mEq/L (ref 19–32)
CREATININE: 1.2 mg/dL (ref 0.4–1.2)
Calcium: 9 mg/dL (ref 8.4–10.5)
GFR: 52.32 mL/min — ABNORMAL LOW (ref >60.00)
Glucose, Bld: 81 mg/dL (ref 70–99)
Potassium: 4 mEq/L (ref 3.5–5.1)
Sodium: 139 mEq/L (ref 135–145)

## 2013-11-30 LAB — URINE CULTURE

## 2013-12-04 NOTE — Progress Notes (Signed)
Quick Note:  Shared MR Cervical Spine results with patient, she verbalized understanding ______

## 2013-12-05 ENCOUNTER — Encounter (HOSPITAL_COMMUNITY)
Admission: RE | Admit: 2013-12-05 | Discharge: 2013-12-05 | Disposition: A | Discharge status: Home / Self Care | Payer: Medicare Other | Source: Ambulatory Visit | Attending: Internal Medicine | Admitting: Internal Medicine

## 2013-12-05 DIAGNOSIS — I951 Orthostatic hypotension: Secondary | ICD-10-CM | POA: Insufficient documentation

## 2013-12-05 DIAGNOSIS — I498 Other specified cardiac arrhythmias: Secondary | ICD-10-CM | POA: Insufficient documentation

## 2013-12-05 MED ORDER — SODIUM CHLORIDE 0.9 % IV SOLN
INTRAVENOUS | Status: DC
  Administered 2013-12-05: 12:00:00 via INTRAVENOUS

## 2013-12-06 ENCOUNTER — Telehealth (HOSPITAL_BASED_OUTPATIENT_CLINIC_OR_DEPARTMENT_OTHER): Payer: Self-pay | Admitting: Emergency Medicine

## 2013-12-06 NOTE — Telephone Encounter (Signed)
Post ED Visit - Positive Culture Follow-up  Culture report reviewed by antimicrobial stewardship pharmacist: []  Wes Rosenberg, Pharm.D., BCPS []  Heide Guile, Pharm.D., BCPS [x]  Alycia Rossetti, Pharm.D., BCPS []  [place], [place].D., BCPS, AAHIVP []  Legrand Como, Pharm.D., BCPS, AAHIVP []  Juliene Pina, Pharm.D.  Positive urine culture Treated with Levofloxacin, organism sensitive to the same and no further patient follow-up is required at this time.  Enigma, Rex Kras 12/06/2013, 8:16 PM

## 2013-12-17 ENCOUNTER — Other Ambulatory Visit (HOSPITAL_COMMUNITY): Payer: Self-pay | Admitting: *Deleted

## 2013-12-18 ENCOUNTER — Ambulatory Visit (INDEPENDENT_AMBULATORY_CARE_PROVIDER_SITE_OTHER): Payer: Medicare Other | Admitting: Family Medicine

## 2013-12-18 ENCOUNTER — Encounter (HOSPITAL_COMMUNITY)
Admission: RE | Admit: 2013-12-18 | Discharge: 2013-12-18 | Disposition: A | Discharge status: Home / Self Care | Payer: Medicare Other | Source: Ambulatory Visit | Attending: Internal Medicine | Admitting: Internal Medicine

## 2013-12-18 ENCOUNTER — Encounter: Payer: Self-pay | Admitting: Family Medicine

## 2013-12-18 VITALS — BP 124/68 | HR 99 | Temp 98.2°F | Wt 168.0 lb

## 2013-12-18 DIAGNOSIS — N390 Urinary tract infection, site not specified: Secondary | ICD-10-CM | POA: Insufficient documentation

## 2013-12-18 DIAGNOSIS — R52 Pain, unspecified: Secondary | ICD-10-CM

## 2013-12-18 DIAGNOSIS — G90A Postural orthostatic tachycardia syndrome (POTS): Secondary | ICD-10-CM

## 2013-12-18 DIAGNOSIS — N3000 Acute cystitis without hematuria: Secondary | ICD-10-CM

## 2013-12-18 DIAGNOSIS — R000 Tachycardia, unspecified: Principal | ICD-10-CM

## 2013-12-18 DIAGNOSIS — I951 Orthostatic hypotension: Principal | ICD-10-CM

## 2013-12-18 DIAGNOSIS — I498 Other specified cardiac arrhythmias: Secondary | ICD-10-CM

## 2013-12-18 DIAGNOSIS — Z309 Encounter for contraceptive management, unspecified: Secondary | ICD-10-CM

## 2013-12-18 MED ORDER — SODIUM CHLORIDE 0.9 % IV SOLN
INTRAVENOUS | Status: DC
  Administered 2013-12-18 (×2): via INTRAVENOUS

## 2013-12-18 MED ORDER — OXYCODONE-ACETAMINOPHEN 5-325 MG PO TABS: 1.0000 | ORAL_TABLET | Freq: Four times a day (QID) | ORAL | Status: DC | PRN

## 2013-12-18 MED ORDER — MEDROXYPROGESTERONE ACETATE 150 MG/ML IM SUSP
150.0000 mg | Freq: Once | INTRAMUSCULAR | Status: AC
  Administered 2013-12-18: 150 mg via INTRAMUSCULAR

## 2013-12-18 NOTE — Assessment & Plan Note (Signed)
With recurrent syncope. Followed by cards, receives infusions.

## 2013-12-18 NOTE — Assessment & Plan Note (Signed)
>  25 minutes spent in face to face time with patient, >50% spent in counselling or coordination of care She is very upset- feels frustrated that it will take weeks to get in with a pain clinic.  I advised her that I cannot do anything about this and I suggested she try again with her fibro pain management doctor.  She already knows her and perhaps they can work something out.  She has asked for another neuro Melissa [name], does not want to see [name] again.  I discussed possibly not changing doctors so frequently ( I started caring for her when she fired [name] in our practice).  It is hard for doctors to help her with her complicated case when she is changing doctors so frequently.  She did not leave my office very happy today- she may consider firing me as well.

## 2013-12-18 NOTE — Assessment & Plan Note (Signed)
Asymptomatic s/p course of levaquin.

## 2013-12-18 NOTE — Progress Notes (Signed)
Subjective:   Patient ID: Melissa [name], female    DOB: [birth year], 43 y.o.   MRN: [id number]  CIRCE CHILTON is a pleasant 43 y.o. year old female who presents to clinic today with Follow-up  on 12/18/2013  HPI:  Right sided pain/weakness-  I saw her for this complaint on 5/28- ordered MRI which was neg and referred to neuro. Percocet also refilled.  Neuro note reviewed from 6/5- saw [name]- MRI of cervical spine neg. EMG ordered- but pt refused because she had been through two in past. Referred to pain management.  She is very unhappy with that visit.  Has multiple issues to discuss today.  Known h/o fibromyalgia and POTs.  Was seen in ER on 6/10 for two episodes of syncope. Notes reviewed.  Treated for UTI with Levquin.  Urine cx >100,000 GBS.  Psychiatrist is adjusting her medications because of a study of "how she metabolizes meds." Awaiting records.  UTD DEXA- due for Depo today.   Current Outpatient Prescriptions on File Prior to Visit  Medication Sig Dispense Refill  . cholecalciferol (VITAMIN D) 1000 UNITS tablet Take 3,000 Units by mouth daily.      . clonazePAM (KLONOPIN) 1 MG tablet Take 1 mg by mouth 3 (three) times daily as needed for anxiety (anxiety).      . fexofenadine (ALLEGRA) 180 MG tablet Take 180 mg by mouth daily.      Marland Kitchen gabapentin (NEURONTIN) 300 MG capsule Take 600 mg by mouth 2 (two) times daily.      . medroxyPROGESTERone (DEPO-PROVERA) 150 MG/ML injection Inject 150 mg into the muscle every 3 (three) months.      . promethazine (PHENERGAN) 25 MG tablet Take 1 tablet (25 mg total) by mouth every 8 (eight) hours as needed for nausea or vomiting.  15 tablet  0  . ranitidine (ZANTAC) 150 MG tablet Take 150 mg by mouth daily.      Marland Kitchen tiZANidine (ZANAFLEX) 4 MG tablet Take 4 mg by mouth every 8 (eight) hours as needed for muscle spasms.      Marland Kitchen topiramate (TOPAMAX) 50 MG tablet Take 100 mg by mouth daily.      Marland Kitchen venlafaxine XR (EFFEXOR-XR) 75 MG 24 hr  capsule Take 75 mg by mouth every morning.       . vitamin B-12 (CYANOCOBALAMIN) 1000 MCG tablet Take 1,000 mcg by mouth daily.       No current facility-administered medications on file prior to visit.    Allergies  Allergen Reactions  . Azithromycin     rash  . Depakote [Divalproex Sodium]     Hair loss  . Lithium     Rash  . Penicillins     rash  . Seroquel [Quetiapine Fumarate]     irritable  . Sulfa Antibiotics     Rash  . Pregabalin Palpitations    Manic symptoms, no sleep x's 3 days, no appetite.    Past Medical History  Diagnosis Date  . Bipolar disorder   . Fibromyalgia     on disability  . Migraine   . Panic attack   . Muscle spasm   . Uterine perforation     hx of  . Syncope     recurrent  . POTS (postural orthostatic tachycardia syndrome)   . Pain of right side of body     chronic    Past Surgical History  Procedure Laterality Date  . Dilation and curettage of uterus    .  Cataract extraction  dec 2014  . Lasik  dec 2014    Family History  Problem Relation Age of Onset  . Diabetes Mother   . Hypertension Mother   . Cancer Father     brain tumor  . Cancer Maternal Grandmother   . Cancer Maternal Grandfather   . Cancer Paternal Grandmother   . Cancer Paternal Grandfather     History   Social History  . Marital Status: Married    Spouse Name: Izell [place]    Number of Children: 0  . Years of Education: HS/college   Occupational History  .  Other    disabled   Social History Main Topics  . Smoking status: Never Smoker   . Smokeless tobacco: Never Used  . Alcohol Use: No     Comment: 1 holiday drink yearly  . Drug Use: No  . Sexual Activity: Not on file   Other Topics Concern  . Not on file   Social History Narrative   Married 20+ years   From [place]   On disability for fibromyalgia   Caffeine Use: 1 soda daily   The PMH, PSH, Social History, Family History, Medications, and allergies have been reviewed in [facility], and have been  updated if relevant.     Review of Systems No new hand tremors +bilateral HA No blurred vision  No nausea or vomiting No dysuria     Objective:    BP 124/68  Pulse 99  Temp(Src) 98.2 F (36.8 C) (Oral)  Wt 168 lb (76.204 kg)  SpO2 97%   Physical Exam  Gen: alert, NAD, tearful Abd:  Soft, NT, no CVA tenderness Neuro: Slow gait, no shuffling No facial droop CNII-XII intact Right decreased grip strength (baseline) Normal gait      Assessment & Plan:   POTS (postural orthostatic tachycardia syndrome)  Pain of right side of body  Acute cystitis without hematuria No Follow-up on file.

## 2013-12-18 NOTE — Addendum Note (Signed)
Addended by: Modena Nunnery on: 12/18/2013 08:06 AM   Modules accepted: Orders

## 2013-12-18 NOTE — Progress Notes (Signed)
Pre visit review using our clinic review tool, if applicable. No additional management support is needed unless otherwise documented below in the visit note. 

## 2013-12-24 ENCOUNTER — Other Ambulatory Visit: Payer: Self-pay | Admitting: Family Medicine

## 2013-12-24 NOTE — Telephone Encounter (Signed)
Lm on pts vm informing her Rx has been faxed to requested pharmacy.  

## 2013-12-24 NOTE — Telephone Encounter (Signed)
Pt requesting medication refill. Last f/u appt 11/2013 with no future appts scheduled. pls advise

## 2014-01-02 ENCOUNTER — Telehealth: Payer: Self-pay | Admitting: *Deleted

## 2014-01-02 NOTE — Telephone Encounter (Signed)
lmtcb   (when pt calls back - need to inform her that guidelines show non-therapeutic value from prn IVF, and as such Dr. Caryl Comes is not ordering these anymore)

## 2014-01-02 NOTE — Telephone Encounter (Signed)
Dr. Caryl Comes called and spoke with patient, explaining reasoning for no more prn IVF.

## 2014-01-02 NOTE — Telephone Encounter (Signed)
Pt calls me back, informed her of new guidelines/findings. Pt is concerned as to what to do if she can't get prn IVF. She states that if she doesn't get them she ends up passing out at home, ends up in the ED and still receives fluids for dehydration. She wants recommendations about this. Will review with Dr. Caryl Comes and get back with her and she is agreeable.

## 2014-01-03 ENCOUNTER — Encounter (HOSPITAL_COMMUNITY): Payer: Medicare Other

## 2014-01-03 ENCOUNTER — Encounter: Payer: Self-pay | Admitting: Neurology

## 2014-01-03 ENCOUNTER — Ambulatory Visit (INDEPENDENT_AMBULATORY_CARE_PROVIDER_SITE_OTHER): Payer: Medicare Other | Admitting: Neurology

## 2014-01-03 ENCOUNTER — Encounter (INDEPENDENT_AMBULATORY_CARE_PROVIDER_SITE_OTHER): Payer: Self-pay

## 2014-01-03 VITALS — BP 102/69 | HR 87 | Ht 69.0 in | Wt 167.4 lb

## 2014-01-03 DIAGNOSIS — R5383 Other fatigue: Secondary | ICD-10-CM

## 2014-01-03 DIAGNOSIS — R202 Paresthesia of skin: Secondary | ICD-10-CM

## 2014-01-03 DIAGNOSIS — R209 Unspecified disturbances of skin sensation: Secondary | ICD-10-CM

## 2014-01-03 DIAGNOSIS — R531 Weakness: Secondary | ICD-10-CM

## 2014-01-03 DIAGNOSIS — R5381 Other malaise: Secondary | ICD-10-CM

## 2014-01-03 NOTE — Patient Instructions (Signed)
Overall you are doing fairly well but I do want to suggest a few things today:   Remember to drink plenty of fluid, eat healthy meals and do not skip any meals. Try to eat protein with a every meal and eat a healthy snack such as fruit or nuts in between meals. Try to keep a regular sleep-wake schedule and try to exercise daily, particularly in the form of walking, 20-30 minutes a day, if you can.   Please follow up with your pain clinic referral. If you have trouble getting an appointment then please call us and we will find you another doctor.   As far as diagnostic testing:  1)Please schedule an EMG/NCS when you check out 2)Please have some blood work completed today  I would like to see you back once the workup is completed, sooner if we need to. Please call us with any interim questions, concerns, problems, updates or refill requests.   My clinical assistant and will answer any of your questions and relay your messages to me and also relay most of my messages to you.   Our phone number is [phone]. We also have an after hours call service for urgent matters and there is a physician on-call for urgent questions. For any emergencies you know to call 911 or go to the nearest emergency room

## 2014-01-03 NOTE — Progress Notes (Signed)
PATIENT: Melissa [name] DOB: [birth year]  HISTORICAL  Melissa [name] is a 43 years old right-handed Caucasian female, accompanied by her husband, referred by her primary care physician Dr. Deborra Medina for evaluation of severe pain involving her right arm, and leg. At last visit had a MRI of the cervical spine which was unremarkable. Pain is mainly the whole right side arm and leg. Described as a constant aching pain that radiates proximal to distal. Can be worsened by exertion but no triggering factors. No alleviating factors. Notes intermittent episodes of pins and needles in her distal extremities on the right. Denies any weakness. Notes intermittent episodes of blurry vision. Had recent cataract surgery. She has had a recent MRI of brain and C spine which were both unremarkable. Has not had an EMG/NCS since recent onset of these symptoms.   She has a history of fibromyalgia and states that this is a different type of pain. She was diagnosed with fibromyalgia around 18 years ago.   Has been given Percocet by her PCP which seems to help. Currently taking Gabapentin 616m BID, zanaflex 410mbid, topamax 10029mAlso takes Effexor and Geodon for Bipolar disorder.   Prior visit with Dr YanKrista Bluehe has past medical history of bipolar disorder, fibromyalgia for more than 20 years, has been on chronic polypharmacy treatment, including, pain, Neurontin, tizanidine, Topamax for migraine prevention, trazodone, Effexor,   In Nov 02 2013, without clear trigger, while riding the car, she began to complain right arm, right leg pain, getting worse over the past few weeks, now also involving the right neck, constant, deep achy pain,  She has to take out of recommended dose of tizanidine 4 mg 3 tablets at one time, also oxycodone, Tylenol, for her pain, she has tried Neurontin, tramadol, without helping her symptoms,  [facility] that [facility] normal, x-ray of cervical showed mild  spondylitic changes.  REVIEW OF SYSTEMS: Full 14 system review of systems performed and notable only for fatigue, blurred vision, diarrhea, urination problem, achy muscles, memory loss, confusion, weakness, dizziness, passing out, anxiety, change in appetite, disinterested in activities  ALLERGIES: Allergies  Allergen Reactions  . Azithromycin     rash  . Depakote [Divalproex Sodium]     Hair loss  . Lithium     Rash  . Penicillins     rash  . Seroquel [Quetiapine Fumarate]     irritable  . Sulfa Antibiotics     Rash  . Pregabalin Palpitations    Manic symptoms, no sleep x's 3 days, no appetite.    HOME MEDICATIONS: Current Outpatient Prescriptions on File Prior to Visit  Medication Sig Dispense Refill  . alendronate (FOSAMAX) 70 MG tablet Take 70 mg by mouth once a week. Take with a full glass of water on an empty stomach. On saturdays      . cholecalciferol (VITAMIN D) 400 UNITS TABS tablet Take 3,000 Units by mouth daily.       . clonazePAM (KLONOPIN) 1 MG tablet Take 1 mg by mouth 3 (three) times daily as needed for anxiety (anxiety).      . fexofenadine (ALLEGRA) 180 MG tablet Take 180 mg by mouth daily.      . gMarland Kitchenbapentin (NEURONTIN) 300 MG capsule TAKE TWO CAPSULES TWICE A DAY  120 capsule  0  . Lurasidone HCl (LATUDA) 20 MG TABS Take 1 tablet by mouth daily.      . medroxyPROGESTERone (DEPO-PROVERA) 150 MG/ML injection  Inject 150 mg into the muscle every 3 (three) months.      Marland Kitchen oxyCODONE-acetaminophen (PERCOCET/ROXICET) 5-325 MG per tablet Take 1 tablet by mouth every 6 (six) hours as needed for severe pain.  30 tablet  0  . promethazine (PHENERGAN) 25 MG tablet Take 1 tablet (25 mg total) by mouth every 8 (eight) hours as needed for nausea or vomiting.  15 tablet  0  . ranitidine (ZANTAC) 150 MG tablet Take 150 mg by mouth daily.      Marland Kitchen tiZANidine (ZANAFLEX) 4 MG tablet TAKE 1 TABLET EVERY 8 HOURS AS NEEDED FOR MUSCLE SPASMS.  30 tablet  0  . topiramate (TOPAMAX) 50 MG  tablet Take 100 mg by mouth daily.      . traZODone (DESYREL) 100 MG tablet Take 200 mg by mouth at bedtime.       Marland Kitchen venlafaxine XR (EFFEXOR-XR) 75 MG 24 hr capsule Take 150 mg by mouth every morning.      . vitamin B-12 (CYANOCOBALAMIN) 1000 MCG tablet Take 1,000 mcg by mouth daily.         PAST MEDICAL HISTORY: Past Medical History  Diagnosis Date  . Bipolar disorder   . Fibromyalgia     on disability  . Migraine   . Panic attack   . Muscle spasm   . Uterine perforation     hx of  . Syncope   . POTS (postural orthostatic tachycardia syndrome)     PAST SURGICAL HISTORY: Past Surgical History  Procedure Laterality Date  . Dilation and curettage of uterus    . Cataract extraction  dec 2014  . Lasik  dec 2014    FAMILY HISTORY: Family History  Problem Relation Age of Onset  . Diabetes Mother   . Hypertension Mother   . Cancer Father     brain tumor  . Cancer Maternal Grandmother   . Cancer Maternal Grandfather   . Cancer Paternal Grandmother   . Cancer Paternal Grandfather     SOCIAL HISTORY:  History   Social History  . Marital Status: Married    Spouse Name: Izell [place]    Number of Children: 0  . Years of Education: HS/college   Occupational History    disabled   Social History Main Topics  . Smoking status: Never Smoker   . Smokeless tobacco: Never Used  . Alcohol Use: No     Comment: 1 holiday drink yearly  . Drug Use: No  . Sexual Activity: Not on file   Other Topics Concern  . Not on file   Social History Narrative   Married 20+ years   From [place]   On disability for fibromyalgia   Caffeine Use: 1 soda daily     PHYSICAL EXAM   Filed Vitals:   01/03/14 1001  BP: 102/69  Pulse: 87  Height: '5\' 9"'  (1.753 m)  Weight: 167 lb 6.4 oz (75.932 kg)    Body mass index is 24.71 kg/(m^2).   Generalized: In no acute distress  Neck: Supple, no carotid bruits   Cardiac: Regular rate rhythm  Pulmonary: Clear to auscultation  bilaterally  Musculoskeletal: No deformity  Neurological examination  Mentation: Alert oriented to time, place, history taking, and causual conversation  Cranial nerve II-XII: Pupils were equal round reactive to light. Extraocular movements were full.  Visual field were full on confrontational test. Bilateral fundi were sharp.  Facial sensation and strength were normal. Hearing was intact to finger rubbing bilaterally. Uvula tongue midline.  Head turning and shoulder shrug and were normal and symmetric.Tongue protrusion into cheek strength was normal.  Motor: Normal tone, bulk and strength.  Sensory: Intact to fine touch, pinprick, preserved vibratory sensation, and proprioception at toes.  Coordination: Normal finger to nose, heel-to-shin bilaterally there was no truncal ataxia  Gait: Rising up from seated position without assistance, normal stance, without trunk ataxia, moderate stride, good arm swing, smooth turning, able to perform tiptoe, and heel walking without difficulty.   Romberg signs: Negative  Deep tendon reflexes: Brachioradialis 2/2, biceps 2/2, triceps 2/2, patellar 2/2, Achilles 2/2, plantar responses were flexor bilaterally.   DIAGNOSTIC DATA (LABS, IMAGING, TESTING) - I reviewed patient records, labs, notes, testing and imaging myself where available.  Lab Results  Component Value Date   WBC 8.6 11/28/2013   HGB 14.2 11/28/2013   HCT 41.5 11/28/2013   MCV 85.6 11/28/2013   PLT 253 11/28/2013      Component Value Date/Time   NA 139 11/30/2013 1431   K 4.0 11/30/2013 1431   CL 111 11/30/2013 1431   CO2 22 11/30/2013 1431   GLUCOSE 81 11/30/2013 1431   BUN 11 11/30/2013 1431   CREATININE 1.2 11/30/2013 1431   CALCIUM 9.0 11/30/2013 1431   PROT 6.4 11/15/2013 0809   ALBUMIN 3.8 11/15/2013 0809   AST 23 11/15/2013 0809   ALT 24 11/15/2013 0809   ALKPHOS 35* 11/15/2013 0809   BILITOT 0.5 11/15/2013 0809   GFRNONAA 64* 11/28/2013 1550   GFRAA 75* 11/28/2013 1550   Lab  Results  Component Value Date   CHOL 245* 02/26/2013   HDL 34.20* 02/26/2013   LDLDIRECT 107.8 02/26/2013   TRIG 742.0* 02/26/2013   CHOLHDL 7 02/26/2013   No results found for this basename: HGBA1C   Lab Results  Component Value Date   VITAMINB12 202* 11/15/2013   ASSESSMENT AND PLAN  LEA BAINE is a 43 y.o. female complains of  right-sided pain, predominantly of the UE and LE in a radiating pattern. She has had a normal brain and cervical spine MRI. Unclear etiology of her symptoms. Radiating symptoms raise concern for possible radiculopathy. Patient does have a history of fibromyalgia which could be contributing to her symptoms. Will check EMG/NCS and ANA, Lyme, ESR. Follow up once workup completed. Can consider switching gabapentin to Lyrica. Patient has pain clinic referral pending.   Jim Like, DO  [facility] [address], [place], [place] [address] [phone]

## 2014-01-04 LAB — SEDIMENTATION RATE: SED RATE: 2 mm/h (ref 0–32)

## 2014-01-04 LAB — ANA W/REFLEX IF POSITIVE: ANA: NEGATIVE

## 2014-01-04 LAB — LYME, TOTAL AB TEST/REFLEX: Lyme IgG/IgM Ab: <0.91 {ISR} (ref 0.00–0.90)

## 2014-01-07 ENCOUNTER — Telehealth: Payer: Self-pay | Admitting: Internal Medicine

## 2014-01-07 ENCOUNTER — Other Ambulatory Visit: Payer: Self-pay

## 2014-01-07 ENCOUNTER — Telehealth: Payer: Self-pay

## 2014-01-07 ENCOUNTER — Telehealth: Payer: Self-pay | Admitting: *Deleted

## 2014-01-07 MED ORDER — OXYCODONE-ACETAMINOPHEN 5-325 MG PO TABS: 1.0000 | ORAL_TABLET | Freq: Three times a day (TID) | ORAL | Status: DC

## 2014-01-07 NOTE — Telephone Encounter (Signed)
°  Patient has questions about going over to short stay and getting fluids. Please call and advise.

## 2014-01-07 NOTE — Telephone Encounter (Signed)
Message copied by Vivi Barrack on Mon Jan 07, 2014  2:26 PM ------      Message from: Drema Dallas      Created: Mon Jan 07, 2014  7:45 AM       Please let her know her labs are normal and we are awaiting her EMG/NCS study. Thanks. ------

## 2014-01-07 NOTE — Telephone Encounter (Signed)
Pain clinic may be managing this now.  Please check.

## 2014-01-07 NOTE — Telephone Encounter (Signed)
Marion, did we not already refer her to pain management?  I thought she had an appointment coming up.

## 2014-01-07 NOTE — Telephone Encounter (Signed)
Per Dr Deborra Medina, ok to change sig and number of tabs. Spoke to pt and informed her Rx is available for pickup at the front desk

## 2014-01-07 NOTE — Telephone Encounter (Signed)
Mr Eustache is concerned about pt having so much pain; request that pt be referred to pain mgt. Mr Bath request cb. Advised Mr Novak could not find DPR form so I cannot give him any information but I can send the note. Mr Prashad said he and his wife were coming to office this afternoon and pt would sign DPR.

## 2014-01-07 NOTE — Telephone Encounter (Signed)
Spoke to patient and she is aware of normal lab results. Patient is aware that someone will call her back when the NCS/EMG results become available.

## 2014-01-07 NOTE — Telephone Encounter (Signed)
Spoke with pt's husband (pt brushing her teeth). Pt states Dr. Caryl Comes stated ok to continue prn IVF for another 2 months. I explained that I would need to verify with him and get back with them, they are agreeable to this.

## 2014-01-07 NOTE — Telephone Encounter (Signed)
Spoke to pt who states that it has been appx 8-9 wks since she was to see the pain clinic, but has not been scheduled an appt as of yet. Last refill showing 12/18/13, which indicates the refill is early. Pt states that she has been taking meds TID. Pls advise

## 2014-01-07 NOTE — Telephone Encounter (Signed)
Pt left v/m requesting rx oxycodone apap. Call when ready for pick up.

## 2014-01-10 NOTE — Telephone Encounter (Signed)
Spoke with patient yesterday.  Orders for another 2 months faxed to short stay.

## 2014-01-11 ENCOUNTER — Encounter (HOSPITAL_COMMUNITY)
Admission: RE | Admit: 2014-01-11 | Discharge: 2014-01-11 | Disposition: A | Discharge status: Home / Self Care | Payer: Medicare Other | Source: Ambulatory Visit | Attending: Internal Medicine | Admitting: Internal Medicine

## 2014-01-11 DIAGNOSIS — I498 Other specified cardiac arrhythmias: Secondary | ICD-10-CM | POA: Diagnosis present

## 2014-01-11 DIAGNOSIS — I951 Orthostatic hypotension: Secondary | ICD-10-CM | POA: Diagnosis not present

## 2014-01-11 MED ORDER — SODIUM CHLORIDE 0.9 % IV SOLN
INTRAVENOUS | Status: DC
  Administered 2014-01-11: 09:00:00 via INTRAVENOUS

## 2014-01-14 NOTE — Telephone Encounter (Signed)
This patient was referred to Dr Neomia Dear by Dr Krista Blue for Pain Management.

## 2014-01-21 ENCOUNTER — Other Ambulatory Visit (HOSPITAL_COMMUNITY): Payer: Self-pay | Admitting: *Deleted

## 2014-01-22 ENCOUNTER — Encounter (INDEPENDENT_AMBULATORY_CARE_PROVIDER_SITE_OTHER): Payer: Self-pay | Admitting: Radiology

## 2014-01-22 ENCOUNTER — Inpatient Hospital Stay (HOSPITAL_COMMUNITY): Admission: RE | Admit: 2014-01-22 | Payer: Medicare Other | Source: Ambulatory Visit

## 2014-01-22 ENCOUNTER — Ambulatory Visit (INDEPENDENT_AMBULATORY_CARE_PROVIDER_SITE_OTHER): Payer: Medicare Other | Admitting: Diagnostic Neuroimaging

## 2014-01-22 DIAGNOSIS — Z0289 Encounter for other administrative examinations: Secondary | ICD-10-CM

## 2014-01-22 DIAGNOSIS — R5381 Other malaise: Secondary | ICD-10-CM

## 2014-01-22 DIAGNOSIS — R202 Paresthesia of skin: Secondary | ICD-10-CM

## 2014-01-22 DIAGNOSIS — M542 Cervicalgia: Secondary | ICD-10-CM

## 2014-01-22 DIAGNOSIS — R531 Weakness: Secondary | ICD-10-CM

## 2014-01-22 DIAGNOSIS — R5383 Other fatigue: Secondary | ICD-10-CM

## 2014-01-22 NOTE — Procedures (Signed)
   [facility]  NCS (NERVE CONDUCTION STUDY) WITH EMG (ELECTROMYOGRAPHY) REPORT   STUDY DATE: 01/22/14 PATIENT NAME: Melissa [name] DOB: 06-01-1971 MRN: [id number]  ORDERING CLINICIAN: Jim Like, DO   TECHNOLOGIST: Towana Badger  ELECTROMYOGRAPHER: Earlean Polka. Makaela Cando, MD  CLINICAL INFORMATION: 43 year old female with right arm and right leg pain and numbness.  FINDINGS: NERVE CONDUCTION STUDY: Right median, right ulnar, right peroneal, right tibial motor responses and F-wave latencies are normal. Right median, right ulnar, right sural sensory responses are normal.  NEEDLE ELECTROMYOGRAPHY: Needle examination of right upper extremity deltoid, biceps, triceps, flexor carpi radialis, first dorsal interosseous is normal.  IMPRESSION:  Normal study. No evidence of large fiber neuropathy at this time.    INTERPRETING PHYSICIAN:  Penni Bombard, MD Certified in Neurology, Neurophysiology and Neuroimaging  [facility] [address] [place], [place] [address] [phone]

## 2014-01-24 ENCOUNTER — Ambulatory Visit (INDEPENDENT_AMBULATORY_CARE_PROVIDER_SITE_OTHER)
Admission: RE | Admit: 2014-01-24 | Discharge: 2014-01-24 | Disposition: A | Discharge status: Home / Self Care | Payer: Medicare Other | Source: Ambulatory Visit | Attending: Internal Medicine | Admitting: Internal Medicine

## 2014-01-24 ENCOUNTER — Encounter: Payer: Self-pay | Admitting: Internal Medicine

## 2014-01-24 ENCOUNTER — Ambulatory Visit (INDEPENDENT_AMBULATORY_CARE_PROVIDER_SITE_OTHER): Payer: Medicare Other | Admitting: Internal Medicine

## 2014-01-24 VITALS — BP 124/62 | HR 68 | Temp 98.2°F | Wt 171.0 lb

## 2014-01-24 DIAGNOSIS — R1115 Cyclical vomiting syndrome unrelated to migraine: Secondary | ICD-10-CM | POA: Diagnosis not present

## 2014-01-24 DIAGNOSIS — R1032 Left lower quadrant pain: Secondary | ICD-10-CM | POA: Diagnosis not present

## 2014-01-24 DIAGNOSIS — R1110 Vomiting, unspecified: Secondary | ICD-10-CM

## 2014-01-24 LAB — COMPREHENSIVE METABOLIC PANEL
ALBUMIN: 4.2 g/dL (ref 3.5–5.2)
ALK PHOS: 42 U/L (ref 39–117)
ALT: 9 U/L (ref 0–35)
AST: 18 U/L (ref 0–37)
BILIRUBIN TOTAL: 0.4 mg/dL (ref 0.2–1.2)
BUN: 9 mg/dL (ref 6–23)
CO2: 19 mEq/L (ref 19–32)
Calcium: 8.9 mg/dL (ref 8.4–10.5)
Chloride: 111 mEq/L (ref 96–112)
Creatinine, Ser: 1.2 mg/dL (ref 0.4–1.2)
GFR: 50.32 mL/min — ABNORMAL LOW (ref >60.00)
GLUCOSE: 90 mg/dL (ref 70–99)
POTASSIUM: 3.9 meq/L (ref 3.5–5.1)
SODIUM: 139 meq/L (ref 135–145)
Total Protein: 7.1 g/dL (ref 6.0–8.3)

## 2014-01-24 LAB — POCT URINALYSIS DIPSTICK
BILIRUBIN UA: NEGATIVE
Blood, UA: NEGATIVE
Glucose, UA: NEGATIVE
KETONES UA: NEGATIVE
LEUKOCYTES UA: NEGATIVE
Nitrite, UA: NEGATIVE
PROTEIN UA: NEGATIVE
SPEC GRAV UA: 1.01
Urobilinogen, UA: NEGATIVE
pH, UA: 6

## 2014-01-24 LAB — CBC
HEMATOCRIT: 40.5 % (ref 36.0–46.0)
Hemoglobin: 13.4 g/dL (ref 12.0–15.0)
MCHC: 33.1 g/dL (ref 30.0–36.0)
MCV: 86.1 fl (ref 78.0–100.0)
Platelets: 236 10*3/uL (ref 150.0–400.0)
RBC: 4.7 Mil/uL (ref 3.87–5.11)
RDW: 14.4 % (ref 11.5–15.5)
WBC: 9.4 10*3/uL (ref 4.0–10.5)

## 2014-01-24 LAB — POCT URINE PREGNANCY: Preg Test, Ur: NEGATIVE

## 2014-01-24 MED ORDER — KETOROLAC TROMETHAMINE 30 MG/ML IJ SOLN
30.0000 mg | Freq: Once | INTRAMUSCULAR | Status: AC
  Administered 2014-01-24: 30 mg via INTRAMUSCULAR

## 2014-01-24 MED ORDER — IOHEXOL 300 MG/ML  SOLN
100.0000 mL | Freq: Once | INTRAMUSCULAR | Status: AC | PRN
  Administered 2014-01-24: 100 mL via INTRAVENOUS

## 2014-01-24 NOTE — Addendum Note (Signed)
Addended by: Royann Shivers A on: 01/24/2014 01:50 PM   Modules accepted: Orders

## 2014-01-24 NOTE — Progress Notes (Signed)
Pre visit review using our clinic review tool, if applicable. No additional management support is needed unless otherwise documented below in the visit note. 

## 2014-01-24 NOTE — Progress Notes (Signed)
Subjective:    Patient ID: Melissa [name], female    DOB: [birth year], 43 y.o.   MRN: [id number]  HPI  Pt presents to the clinic today with c/o LLQ pain. She reports this started today. It came on very sudden. She describes the pain as sharp, shooting and constant.There is nausea and vomiting associated with it. She denies diarrhea, constipation, blood in stool or urinary symptoms. She does not menstruate. She is on Depo every 3 months. She has never had pain like this in the past. She has not tried anything OTC.  Review of Systems   Past Medical History  Diagnosis Date  . Bipolar disorder   . Fibromyalgia     on disability  . Migraine   . Panic attack   . Muscle spasm   . Uterine perforation     hx of  . Syncope     recurrent  . POTS (postural orthostatic tachycardia syndrome)   . Pain of right side of body     chronic    Current Outpatient Prescriptions  Medication Sig Dispense Refill  . cholecalciferol (VITAMIN D) 1000 UNITS tablet Take 3,000 Units by mouth daily.      . clonazePAM (KLONOPIN) 1 MG tablet TAKE 1 TABLET BY MOUTH TWICE A DAILY FORANXIETY  60 tablet  0  . fexofenadine (ALLEGRA) 180 MG tablet Take 180 mg by mouth daily.      Marland Kitchen gabapentin (NEURONTIN) 300 MG capsule Take 600 mg by mouth 2 (two) times daily.      . medroxyPROGESTERone (DEPO-PROVERA) 150 MG/ML injection Inject 150 mg into the muscle every 3 (three) months.      Marland Kitchen oxyCODONE-acetaminophen (PERCOCET/ROXICET) 5-325 MG per tablet Take 1 tablet by mouth 3 (three) times daily.  90 tablet  0  . promethazine (PHENERGAN) 25 MG tablet Take 1 tablet (25 mg total) by mouth every 8 (eight) hours as needed for nausea or vomiting.  15 tablet  0  . ranitidine (ZANTAC) 150 MG tablet Take 150 mg by mouth daily.      Marland Kitchen tiZANidine (ZANAFLEX) 4 MG tablet Take 4 mg by mouth every 8 (eight) hours as needed for muscle spasms.      Marland Kitchen topiramate (TOPAMAX) 50 MG tablet Take 100 mg by mouth daily.      Marland Kitchen venlafaxine (EFFEXOR)  37.5 MG tablet Take 37.5 mg by mouth 2 (two) times daily.      Marland Kitchen venlafaxine XR (EFFEXOR-XR) 75 MG 24 hr capsule Take 75 mg by mouth every morning.       . vitamin B-12 (CYANOCOBALAMIN) 1000 MCG tablet Take 1,000 mcg by mouth daily.      . ziprasidone (GEODON) 60 MG capsule Take 60 mg by mouth daily.       No current facility-administered medications for this visit.    Allergies  Allergen Reactions  . Azithromycin     rash  . Depakote [Divalproex Sodium]     Hair loss  . Lithium     Rash  . Penicillins     rash  . Seroquel [Quetiapine Fumarate]     irritable  . Sulfa Antibiotics     Rash  . Pregabalin Palpitations    Manic symptoms, no sleep x's 3 days, no appetite.    Family History  Problem Relation Age of Onset  . Diabetes Mother   . Hypertension Mother   . Cancer Father     brain tumor  . Cancer Maternal Grandmother   . Cancer  Maternal Grandfather   . Cancer Paternal Grandmother   . Cancer Paternal Grandfather     History   Social History  . Marital Status: Married    Spouse Name: Izell [place]    Number of Children: 0  . Years of Education: HS/college   Occupational History  .  Other    disabled   Social History Main Topics  . Smoking status: Never Smoker   . Smokeless tobacco: Never Used  . Alcohol Use: No     Comment: 1 holiday drink yearly  . Drug Use: No  . Sexual Activity: Not on file   Other Topics Concern  . Not on file   Social History Narrative   Married 20+ years   From [place]   On disability for fibromyalgia   Caffeine Use: 1 soda daily     Constitutional: Denies fever, malaise, fatigue, headache or abrupt weight changes.  Respiratory: Denies difficulty breathing, shortness of breath, cough or sputum production.   Cardiovascular: Denies chest pain, chest tightness, palpitations or swelling in the hands or feet.  Gastrointestinal: Pt reports abdominal pain. Denies bloating, constipation, diarrhea or blood in the stool.  GU: Denies  urgency, frequency, pain with urination, burning sensation, blood in urine, odor or discharge. Skin: Denies redness, rashes, lesions or ulcercations.   No other specific complaints in a complete review of systems (except as listed in HPI above).   Objective:   Physical Exam   BP 124/62  Pulse 68  Temp(Src) 98.2 F (36.8 C) (Oral)  Wt 171 lb (77.565 kg) Wt Readings from Last 3 Encounters:  01/24/14 171 lb (77.565 kg)  01/03/14 167 lb 6.4 oz (75.932 kg)  12/18/13 168 lb (76.204 kg)    General: Appears her stated age, in obvious pain, shaking but in NAD. Cardiovascular: Normal rate and rhythm. S1,S2 noted.  No murmur, rubs or gallops noted. No JVD or BLE edema. No carotid bruits noted. Pulmonary/Chest: Normal effort and positive vesicular breath sounds. No respiratory distress. No wheezes, rales or ronchi noted.  Abdomen: Soft and tender over the bladder and LLQ. She is guarding. Normal bowel sounds, no bruits noted. No distention or masses noted. Liver, spleen and kidneys non palpable. No CVA tenderness.   BMET    Component Value Date/Time   NA 139 11/30/2013 1431   K 4.0 11/30/2013 1431   CL 111 11/30/2013 1431   CO2 22 11/30/2013 1431   GLUCOSE 81 11/30/2013 1431   BUN 11 11/30/2013 1431   CREATININE 1.2 11/30/2013 1431   CALCIUM 9.0 11/30/2013 1431   GFRNONAA 64* 11/28/2013 1550   GFRAA 75* 11/28/2013 1550    Lipid Panel     Component Value Date/Time   CHOL 245* 02/26/2013 1053   TRIG 742.0* 02/26/2013 1053   HDL 34.20* 02/26/2013 1053   CHOLHDL 7 02/26/2013 1053   VLDL 148.4* 02/26/2013 1053    CBC    Component Value Date/Time   WBC 8.6 11/28/2013 1550   RBC 4.85 11/28/2013 1550   HGB 14.2 11/28/2013 1550   HCT 41.5 11/28/2013 1550   PLT 253 11/28/2013 1550   MCV 85.6 11/28/2013 1550   MCH 29.3 11/28/2013 1550   MCHC 34.2 11/28/2013 1550   RDW 13.6 11/28/2013 1550   LYMPHSABS 2.1 11/05/2013 1239   MONOABS 0.5 11/05/2013 1239   EOSABS 0.1 11/05/2013 1239   BASOSABS 0.0 11/05/2013  1239    Hgb A1C No results found for this basename: HGBA1C  Assessment & Plan:   LLQ abdominal pain:  Urinalysis: normal Urine Hcg: neg Will obtain STAT CBC and CMET Toradol 30 mg IM today Will order CT scan of abd- diverticulitis vs ovarian torsion  Will follow up with you after the test results are back. If pain worsens, go to ER immediately

## 2014-01-24 NOTE — Patient Instructions (Addendum)

## 2014-01-29 ENCOUNTER — Telehealth: Payer: Self-pay | Admitting: *Deleted

## 2014-01-29 NOTE — Telephone Encounter (Signed)
Called Dr. Unice Bailey office to ask about the Pain Clinic referral that was sent in on November 23, 2013.  The patient states she has not heard anything from her office.

## 2014-01-30 ENCOUNTER — Encounter (HOSPITAL_COMMUNITY)
Admission: RE | Admit: 2014-01-30 | Discharge: 2014-01-30 | Disposition: A | Discharge status: Home / Self Care | Payer: Medicare Other | Source: Ambulatory Visit | Attending: Internal Medicine | Admitting: Internal Medicine

## 2014-01-30 DIAGNOSIS — I951 Orthostatic hypotension: Secondary | ICD-10-CM | POA: Diagnosis not present

## 2014-01-30 DIAGNOSIS — I498 Other specified cardiac arrhythmias: Secondary | ICD-10-CM | POA: Diagnosis not present

## 2014-01-30 MED ORDER — SODIUM CHLORIDE 0.9 % IV SOLN
INTRAVENOUS | Status: DC
  Administered 2014-01-30: 2000 mL via INTRAVENOUS

## 2014-01-30 NOTE — Telephone Encounter (Signed)
Called Dr. Unice Bailey office again and was sent to the vm left a detailed message asking for a call back to check on the referral status for the patient.

## 2014-02-01 ENCOUNTER — Telehealth: Payer: Self-pay | Admitting: Family Medicine

## 2014-02-01 NOTE — Telephone Encounter (Signed)
Spoke to pts huband, Izell [place], who states that they live 45 min away from the office and would not be able to make it here within the 24min frame provided. Pts husband advised per Dr Diona Browner and verbally expressed understanding.

## 2014-02-01 NOTE — Telephone Encounter (Signed)
Dr Deborra Medina is out of the office for the remainder of the day; Rena asked that it be forwarded it to you

## 2014-02-01 NOTE — Telephone Encounter (Signed)
Patient Information:  Caller Name: Melissa [name]  Phone: [phone]  Patient: Melissa [name], Melissa [name]  Gender: Female  DOB: 06-11-1971  Age: 43 Years  PCP: Melissa [name] [facility])  Pregnant: No  Office Follow Up:  Does the office need to follow up with this patient?: Yes  Instructions For The Office: Migraine  RN Note:  Spouse calling regarding wife/Melissa [name] who has a history of migraines.  States "Headache started around 11:30.  Melissa [name] took a Ambulance person and laid down.  She has now tried Imitrex without relief."  Calling to see if they can come to office for injection.  Symptoms  Reason For Call & Symptoms: migraines  Reviewed Health History In EMR: Yes  Reviewed Medications In EMR: Yes  Reviewed Allergies In EMR: Yes  Reviewed Surgeries / Procedures: Yes  Date of Onset of Symptoms: 02/01/2014 OB / GYN:  LMP: Unknown  Guideline(s) Used:  Headache  Disposition Per Guideline:   Callback by PCP or Subspecialist within 1 Hour  Reason For Disposition Reached:   Severe headache and has had severe headaches before  Advice Given:  N/A  Patient Will Follow Care Advice:  YES

## 2014-02-01 NOTE — Telephone Encounter (Signed)
If pt can get to office in next 15 minutes I could see her. Otherwise, she can repeat imitrex 2 hours after initial dose and if migraine not resolved, she would need to go to urgent care to treat.

## 2014-02-06 NOTE — Telephone Encounter (Signed)
Dr. Ace Gins office sent over letters that they do no accept [facility] so I am forwarding the referral to [facility].

## 2014-02-07 ENCOUNTER — Ambulatory Visit (INDEPENDENT_AMBULATORY_CARE_PROVIDER_SITE_OTHER): Payer: Medicare Other | Admitting: Internal Medicine

## 2014-02-07 ENCOUNTER — Encounter: Payer: Self-pay | Admitting: Internal Medicine

## 2014-02-07 VITALS — BP 122/84 | HR 77 | Temp 97.9°F | Wt 169.0 lb

## 2014-02-07 DIAGNOSIS — G43919 Migraine, unspecified, intractable, without status migrainosus: Secondary | ICD-10-CM

## 2014-02-07 MED ORDER — SUMATRIPTAN SUCCINATE 100 MG PO TABS: 100.0000 mg | ORAL_TABLET | ORAL | Status: DC | PRN

## 2014-02-07 MED ORDER — KETOROLAC TROMETHAMINE 30 MG/ML IJ SOLN
30.0000 mg | Freq: Once | INTRAMUSCULAR | Status: AC
  Administered 2014-02-07: 30 mg via INTRAMUSCULAR

## 2014-02-07 MED ORDER — PROMETHAZINE HCL 25 MG/ML IJ SOLN
25.0000 mg | Freq: Once | INTRAMUSCULAR | Status: AC
  Administered 2014-02-07: 12.5 mg via INTRAMUSCULAR

## 2014-02-07 NOTE — Addendum Note (Signed)
Addended by: Lurlean Nanny on: 02/07/2014 04:09 PM   Modules accepted: Orders

## 2014-02-07 NOTE — Progress Notes (Signed)
Pre visit review using our clinic review tool, if applicable. No additional management support is needed unless otherwise documented below in the visit note. 

## 2014-02-07 NOTE — Patient Instructions (Signed)

## 2014-02-07 NOTE — Progress Notes (Signed)
Subjective:    Patient ID: Melissa [name], female    DOB: 1970-10-25, 43 y.o.   MRN: [id number]  HPI  Pt presents to the clinic today with c/o a migraine. She reports this started last night. She has had some associated nausea and sensitivity to light and sound. She reports that she has tried her Imitrex but it has not really helped. She has also tried Tylenol. She did have a migraine like this 1 week ago. She went to [facility] where they gave her toradol and phenergan injections. This did provided good relief.  Review of Systems      Past Medical History  Diagnosis Date  . Bipolar disorder   . Fibromyalgia     on disability  . Migraine   . Panic attack   . Muscle spasm   . Uterine perforation     hx of  . Syncope     recurrent  . POTS (postural orthostatic tachycardia syndrome)   . Pain of right side of body     chronic    Current Outpatient Prescriptions  Medication Sig Dispense Refill  . cholecalciferol (VITAMIN D) 1000 UNITS tablet Take 3,000 Units by mouth daily.      . clonazePAM (KLONOPIN) 1 MG tablet TAKE 1 TABLET BY MOUTH TWICE A DAILY FORANXIETY  60 tablet  0  . fexofenadine (ALLEGRA) 180 MG tablet Take 180 mg by mouth daily.      Marland Kitchen gabapentin (NEURONTIN) 300 MG capsule Take 600 mg by mouth 2 (two) times daily.      . medroxyPROGESTERone (DEPO-PROVERA) 150 MG/ML injection Inject 150 mg into the muscle every 3 (three) months.      Marland Kitchen oxyCODONE-acetaminophen (PERCOCET/ROXICET) 5-325 MG per tablet Take 1 tablet by mouth 3 (three) times daily.  90 tablet  0  . promethazine (PHENERGAN) 25 MG tablet Take 1 tablet (25 mg total) by mouth every 8 (eight) hours as needed for nausea or vomiting.  15 tablet  0  . ranitidine (ZANTAC) 150 MG tablet Take 150 mg by mouth daily.      Marland Kitchen tiZANidine (ZANAFLEX) 4 MG tablet Take 4 mg by mouth every 8 (eight) hours as needed for muscle spasms.      Marland Kitchen topiramate (TOPAMAX) 50 MG tablet Take 100 mg by mouth daily.      Marland Kitchen venlafaxine XR (EFFEXOR-XR)  75 MG 24 hr capsule Take 75 mg by mouth every morning.       . vitamin B-12 (CYANOCOBALAMIN) 1000 MCG tablet Take 1,000 mcg by mouth daily.      . ziprasidone (GEODON) 60 MG capsule Take 60 mg by mouth daily.       No current facility-administered medications for this visit.    Allergies  Allergen Reactions  . Azithromycin     rash  . Depakote [Divalproex Sodium]     Hair loss  . Lithium     Rash  . Penicillins     rash  . Seroquel [Quetiapine Fumarate]     irritable  . Sulfa Antibiotics     Rash  . Pregabalin Palpitations    Manic symptoms, no sleep x's 3 days, no appetite.    Family History  Problem Relation Age of Onset  . Diabetes Mother   . Hypertension Mother   . Cancer Father     brain tumor  . Cancer Maternal Grandmother   . Cancer Maternal Grandfather   . Cancer Paternal Grandmother   . Cancer Paternal Grandfather  History   Social History  . Marital Status: Married    Spouse Name: Izell [place]    Number of Children: 0  . Years of Education: HS/college   Occupational History  .  Other    disabled   Social History Main Topics  . Smoking status: Never Smoker   . Smokeless tobacco: Never Used  . Alcohol Use: No     Comment: 1 holiday drink yearly  . Drug Use: No  . Sexual Activity: Not on file   Other Topics Concern  . Not on file   Social History Narrative   Married 20+ years   From [place]   On disability for fibromyalgia   Caffeine Use: 1 soda daily     Constitutional: Pt reports headache. Denies fever, malaise, fatigue, or abrupt weight changes.  HEENT: Pt reports pain behind the eyes. Denies eye pain, eye redness, ear pain, ringing in the ears, wax buildup, runny nose, nasal congestion, bloody nose, or sore throat. Respiratory: Denies difficulty breathing, shortness of breath, cough or sputum production.   Cardiovascular: Denies chest pain, chest tightness, palpitations or swelling in the hands or feet.  Gastrointestinal: Pt reports  nausea. Denies abdominal pain, bloating, constipation, diarrhea or blood in the stool.  Neurological: Denies dizziness, difficulty with memory, difficulty with speech or problems with balance and coordination.   No other specific complaints in a complete review of systems (except as listed in HPI above).  Objective:   Physical Exam   BP 122/84  Pulse 77  Temp(Src) 97.9 F (36.6 C) (Oral)  Wt 169 lb (76.658 kg)  SpO2 99% Wt Readings from Last 3 Encounters:  02/07/14 169 lb (76.658 kg)  01/24/14 171 lb (77.565 kg)  01/03/14 167 lb 6.4 oz (75.932 kg)    General:  Appears her stated age, well developed, well nourished in NAD. HEENT: Head: normal shape and size; Eyes: sclera white, no icterus, conjunctiva pink, PERRLA and EOMs intact; Cardiovascular: Normal rate and rhythm. S1,S2 noted.  No murmur, rubs or gallops noted. No JVD or BLE edema. No carotid bruits noted. Pulmonary/Chest: Normal effort and positive vesicular breath sounds. No respiratory distress. No wheezes, rales or ronchi noted.  Abdomen: Soft and nontender. Normal bowel sounds, no bruits noted. No distention or masses noted. Liver, spleen and kidneys non palpable. Neurological: Alert and oriented.   BMET    Component Value Date/Time   NA 139 01/24/2014 1345   K 3.9 01/24/2014 1345   CL 111 01/24/2014 1345   CO2 19 01/24/2014 1345   GLUCOSE 90 01/24/2014 1345   BUN 9 01/24/2014 1345   CREATININE 1.2 01/24/2014 1345   CALCIUM 8.9 01/24/2014 1345   GFRNONAA 64* 11/28/2013 1550   GFRAA 75* 11/28/2013 1550    Lipid Panel     Component Value Date/Time   CHOL 245* 02/26/2013 1053   TRIG 742.0* 02/26/2013 1053   HDL 34.20* 02/26/2013 1053   CHOLHDL 7 02/26/2013 1053   VLDL 148.4* 02/26/2013 1053    CBC    Component Value Date/Time   WBC 9.4 01/24/2014 1345   RBC 4.70 01/24/2014 1345   HGB 13.4 01/24/2014 1345   HCT 40.5 01/24/2014 1345   PLT 236.0 01/24/2014 1345   MCV 86.1 01/24/2014 1345   MCH 29.3 11/28/2013 1550   MCHC 33.1 01/24/2014 1345     RDW 14.4 01/24/2014 1345   LYMPHSABS 2.1 11/05/2013 1239   MONOABS 0.5 11/05/2013 1239   EOSABS 0.1 11/05/2013 1239   BASOSABS 0.0 11/05/2013  1239    Hgb A1C No results found for this basename: HGBA1C        Assessment & Plan:   Migraine:  Advised her to drink plenty of fluids as dehydration can make this worse 30 mg Toradol IM today 12.5 mg Phenergan IM today Imitrex refilled  RTC as needed or if symptoms persist or worsen

## 2014-02-08 NOTE — Telephone Encounter (Signed)
Kristeen Miss calling to get clarification on patient's referral, states that they are not a pain management clinic so they are wondering why the patient is being referred to them. Please return her call at [phone].

## 2014-02-08 NOTE — Telephone Encounter (Signed)
Noted, and the referral was sent to Dr. Dillard Cannon office on 02/07/2014 and waiting to hear if they will be able to see the patient.

## 2014-02-12 ENCOUNTER — Encounter: Payer: Self-pay | Admitting: Neurology

## 2014-02-12 ENCOUNTER — Ambulatory Visit (INDEPENDENT_AMBULATORY_CARE_PROVIDER_SITE_OTHER): Payer: Medicare Other | Admitting: Neurology

## 2014-02-12 VITALS — BP 120/80 | HR 93 | Ht 69.0 in | Wt 169.0 lb

## 2014-02-12 DIAGNOSIS — R259 Unspecified abnormal involuntary movements: Secondary | ICD-10-CM

## 2014-02-12 DIAGNOSIS — R251 Tremor, unspecified: Secondary | ICD-10-CM

## 2014-02-12 MED ORDER — GABAPENTIN 600 MG PO TABS: 600.0000 mg | ORAL_TABLET | Freq: Three times a day (TID) | ORAL | Status: DC

## 2014-02-12 NOTE — Telephone Encounter (Signed)
I called over to the [name] and they had been trying to reach the patient, was in the office seeing [name] so we scheduled an appointment for September 8th at 8:30 am there.

## 2014-02-12 NOTE — Patient Instructions (Signed)
Overall you are doing fairly well but I do want to suggest a few things today:   Remember to drink plenty of fluid, eat healthy meals and do not skip any meals. Try to eat protein with a every meal and eat a healthy snack such as fruit or nuts in between meals. Try to keep a regular sleep-wake schedule and try to exercise daily, particularly in the form of walking, 20-30 minutes a day, if you can.   As far as your medications are concerned, I would like to suggest you increase the gabapentin to 600mg  three times a day  As far as diagnostic testing: 1)Please have some blood work completed today   Please follow up with Dr Brenton Grills. Please call us with any interim questions, concerns, problems, updates or refill requests.   My clinical assistant and will answer any of your questions and relay your messages to me and also relay most of my messages to you.   Our phone number is [phone]. We also have an after hours call service for urgent matters and there is a physician on-call for urgent questions. For any emergencies you know to call 911 or go to the nearest emergency room

## 2014-02-12 NOTE — Progress Notes (Signed)
PATIENT: Melissa [name] DOB: 1971/06/10  HISTORICAL  Melissa [name] is a 43 years old right-handed Caucasian female, accompanied by her husband, referred by her primary care physician Dr. Deborra Medina for evaluation of severe pain involving her right arm, and leg. She has had an extensive workup done, including MRI brain, C spine and EMG/NCS, all of which have been unremarkable. Continues to have difficulty with pain and muscle stiffness of her RUE. Notes some difficulty with fine motor tasks, notes increasing muscle cramps. No improvement in symptoms. No noted benefit from gabapentin, currently taking 600mg  BID.   Had recent migraine flare up. Taking Topamax 100mg  daily, reports getting good benefit from this medication, currently having < 1 per month.   She has a history of fibromyalgia and states that this is a different type of pain. She was diagnosed with fibromyalgia around 18 years ago.   Currently taking Gabapentin 600mg  BID, zanaflex 4mg  bid, topamax 100mg . Also takes Effexor and Latuda for Bipolar disorder.   Prior visit with Dr Krista Blue: She has past medical history of bipolar disorder, fibromyalgia for more than 20 years, has been on chronic polypharmacy treatment, including, pain, Neurontin, tizanidine, Topamax for migraine prevention, trazodone, Effexor,   In Nov 02 2013, without clear trigger, while riding the car, she began to complain right arm, right leg pain, getting worse over the past few weeks, now also involving the right neck, constant, deep achy pain,  She has to take out of recommended dose of tizanidine 4 mg 3 tablets at one time, also oxycodone, Tylenol, for her pain, she has tried Neurontin, tramadol, without helping her symptoms,  MRI brain reviewed in April 2015 at [facility], that was normal, x-ray of cervical showed mild spondylitic changes.  REVIEW OF SYSTEMS: Full 14 system review of systems performed and notable only for fatigue, blurred vision, diarrhea,  urination problem, achy muscles, memory loss, confusion, weakness, dizziness, passing out, anxiety, change in appetite, disinterested in activities  ALLERGIES: Allergies  Allergen Reactions  . Azithromycin     rash  . Depakote [Divalproex Sodium]     Hair loss  . Lithium     Rash  . Penicillins     rash  . Seroquel [Quetiapine Fumarate]     irritable  . Sulfa Antibiotics     Rash  . Pregabalin Palpitations    Manic symptoms, no sleep x's 3 days, no appetite.    HOME MEDICATIONS: Current Outpatient Prescriptions on File Prior to Visit  Medication Sig Dispense Refill  . alendronate (FOSAMAX) 70 MG tablet Take 70 mg by mouth once a week. Take with a full glass of water on an empty stomach. On saturdays      . cholecalciferol (VITAMIN D) 400 UNITS TABS tablet Take 3,000 Units by mouth daily.       . clonazePAM (KLONOPIN) 1 MG tablet Take 1 mg by mouth 3 (three) times daily as needed for anxiety (anxiety).      . fexofenadine (ALLEGRA) 180 MG tablet Take 180 mg by mouth daily.      Marland Kitchen gabapentin (NEURONTIN) 300 MG capsule TAKE TWO CAPSULES TWICE A DAY  120 capsule  0  . Lurasidone HCl (LATUDA) 20 MG TABS Take 1 tablet by mouth daily.      . medroxyPROGESTERone (DEPO-PROVERA) 150 MG/ML injection Inject 150 mg into the muscle every 3 (three) months.      Marland Kitchen oxyCODONE-acetaminophen (PERCOCET/ROXICET) 5-325 MG per tablet Take 1 tablet by mouth every 6 (six)  hours as needed for severe pain.  30 tablet  0  . promethazine (PHENERGAN) 25 MG tablet Take 1 tablet (25 mg total) by mouth every 8 (eight) hours as needed for nausea or vomiting.  15 tablet  0  . ranitidine (ZANTAC) 150 MG tablet Take 150 mg by mouth daily.      Marland Kitchen tiZANidine (ZANAFLEX) 4 MG tablet TAKE 1 TABLET EVERY 8 HOURS AS NEEDED FOR MUSCLE SPASMS.  30 tablet  0  . topiramate (TOPAMAX) 50 MG tablet Take 100 mg by mouth daily.      . traZODone (DESYREL) 100 MG tablet Take 200 mg by mouth at bedtime.       Marland Kitchen venlafaxine XR  (EFFEXOR-XR) 75 MG 24 hr capsule Take 150 mg by mouth every morning.      . vitamin B-12 (CYANOCOBALAMIN) 1000 MCG tablet Take 1,000 mcg by mouth daily.         PAST MEDICAL HISTORY: Past Medical History  Diagnosis Date  . Bipolar disorder   . Fibromyalgia     on disability  . Migraine   . Panic attack   . Muscle spasm   . Uterine perforation     hx of  . Syncope   . POTS (postural orthostatic tachycardia syndrome)     PAST SURGICAL HISTORY: Past Surgical History  Procedure Laterality Date  . Dilation and curettage of uterus    . Cataract extraction  dec 2014  . Lasik  dec 2014    FAMILY HISTORY: Family History  Problem Relation Age of Onset  . Diabetes Mother   . Hypertension Mother   . Cancer Father     brain tumor  . Cancer Maternal Grandmother   . Cancer Maternal Grandfather   . Cancer Paternal Grandmother   . Cancer Paternal Grandfather     SOCIAL HISTORY:  History   Social History  . Marital Status: Married    Spouse Name: Izell [place]el[place]    Number of Children: 0  . Years of Education: HS/college   Occupational History    disabled   Social History Main Topics  . Smoking status: Never Smoker   . Smokeless tobacco: Never Used  . Alcohol Use: No     Comment: 1 holiday drink yearly  . Drug Use: No  . Sexual Activity: Not on file   Other Topics Concern  . Not on file   Social History Narrative   Married 20+ years   From [place]   On disability for fibromyalgia   Caffeine Use: 1 soda daily     PHYSICAL EXAM   There were no vitals filed for this visit.  There is no weight on file to calculate BMI.   Generalized: In no acute distress  Neck: Supple, no carotid bruits   Cardiac: Regular rate rhythm  Pulmonary: Clear to auscultation bilaterally  Musculoskeletal: No deformity  Neurological examination  Mentation: Alert oriented to time, place, history taking, and causual conversation  Cranial nerve II-XII: Pupils were equal round reactive  to light. Extraocular movements were full.  Visual field were full on confrontational test. Bilateral fundi were sharp.  Facial sensation and strength were normal. Hearing was intact to finger rubbing bilaterally. Uvula tongue midline.  Head turning and shoulder shrug and were normal and symmetric.Tongue protrusion into cheek strength was normal.  Motor: Normal tone, bulk and strength.  Sensory: Intact to fine touch, pinprick, preserved vibratory sensation, and proprioception at toes.  Coordination: Normal finger to nose, heel-to-shin bilaterally there was  no truncal ataxia. Mild rest and postural tremor noted in RUE  Gait: Rising up from seated position without assistance, normal stance, without trunk ataxia, moderate stride, good arm swing, smooth turning, able to perform tiptoe, and heel walking without difficulty.   Romberg signs: Negative  Deep tendon reflexes: Brachioradialis 2/2, biceps 2/2, triceps 2/2, patellar 2/2, Achilles 2/2, plantar responses were flexor bilaterally.   DIAGNOSTIC DATA (LABS, IMAGING, TESTING) - I reviewed patient records, labs, notes, testing and imaging myself where available.  Lab Results  Component Value Date   WBC 9.4 01/24/2014   HGB 13.4 01/24/2014   HCT 40.5 01/24/2014   MCV 86.1 01/24/2014   PLT 236.0 01/24/2014      Component Value Date/Time   NA 139 01/24/2014 1345   K 3.9 01/24/2014 1345   CL 111 01/24/2014 1345   CO2 19 01/24/2014 1345   GLUCOSE 90 01/24/2014 1345   BUN 9 01/24/2014 1345   CREATININE 1.2 01/24/2014 1345   CALCIUM 8.9 01/24/2014 1345   PROT 7.1 01/24/2014 1345   ALBUMIN 4.2 01/24/2014 1345   AST 18 01/24/2014 1345   ALT 9 01/24/2014 1345   ALKPHOS 42 01/24/2014 1345   BILITOT 0.4 01/24/2014 1345   GFRNONAA 64* 11/28/2013 1550   GFRAA 75* 11/28/2013 1550   Lab Results  Component Value Date   CHOL 245* 02/26/2013   HDL 34.20* 02/26/2013   LDLDIRECT 107.8 02/26/2013   TRIG 742.0* 02/26/2013   CHOLHDL 7 02/26/2013   No results found for this basename: HGBA1C     Lab Results  Component Value Date   VITAMINB12 202* 11/15/2013   ASSESSMENT AND PLAN  KINZEE HAPPEL is a 43 y.o. female complains of  right-sided pain, predominantly of the UE and LE in a radiating pattern. Of note she does appear to have a mild rest tremor in her RUE. She has had a normal brain and cervical spine MRI. Normal EMG/NCS. Lab workup so far unremarkable. Unclear etiology of her symptoms.  Patient does have a history of fibromyalgia which could be contributing to her symptoms. Will check ceruloplasmin and heavy metal screen. Follow up once workup completed. Increased gabapentin to 600mg  TID. She is unable to tolerate Lyrica. Patient has pain clinic referral pending.   Jim Like, DO  [facility] [address], [place], [place] [address] [phone]

## 2014-02-14 LAB — HEAVY METALS, BLOOD
Arsenic: 7 ug/L (ref 2–23)
Lead, Blood: NOT DETECTED ug/dL (ref 0–19)
Mercury: NOT DETECTED ug/L (ref 0.0–14.9)

## 2014-02-14 LAB — CERULOPLASMIN: Ceruloplasmin: 30.2 mg/dL (ref 16.0–45.0)

## 2014-02-20 ENCOUNTER — Ambulatory Visit (INDEPENDENT_AMBULATORY_CARE_PROVIDER_SITE_OTHER): Payer: Medicare Other | Admitting: Family Medicine

## 2014-02-20 ENCOUNTER — Encounter: Payer: Self-pay | Admitting: Family Medicine

## 2014-02-20 VITALS — BP 146/82 | HR 87 | Temp 98.1°F | Wt 166.5 lb

## 2014-02-20 DIAGNOSIS — R112 Nausea with vomiting, unspecified: Secondary | ICD-10-CM | POA: Insufficient documentation

## 2014-02-20 DIAGNOSIS — R1115 Cyclical vomiting syndrome unrelated to migraine: Secondary | ICD-10-CM

## 2014-02-20 MED ORDER — TOPIRAMATE 50 MG PO TABS: 100.0000 mg | ORAL_TABLET | Freq: Every day | ORAL | Status: DC

## 2014-02-20 MED ORDER — TIZANIDINE HCL 4 MG PO TABS: 4.0000 mg | ORAL_TABLET | Freq: Three times a day (TID) | ORAL | Status: DC | PRN

## 2014-02-20 MED ORDER — CLONAZEPAM 1 MG PO TABS: ORAL_TABLET | ORAL | Status: DC

## 2014-02-20 NOTE — Progress Notes (Signed)
Subjective:   Patient ID: Melissa [name], female    DOB: Nov 18, 1970, 43 y.o.   MRN: [id number]  Melissa [name] is a pleasant 43 y.o. year old female who presents to clinic today with Nausea and Emesis  on 06/23/7515  HPI: Very complicated medical history, including POTS, fibromyalgia, bipolar disorder.  Past several months, nausea and vomiting"around the time I would get my period if I wasn't on Depo." She also gets very diaphoretic and has diarrhea associated with these episodes. She thinks perhaps she has had syncope with some of the episodes.  Only change is she started Geodon through her psychiatrist shortly before these episodes began.  Followed by neurology- saw [name] on 8/25- note reviewed.  Saw [facility] two times in last month for nausea and migraine.  Notes reviewed.  CT of abdomen done:  EXAM:  CT ABDOMEN AND PELVIS WITH CONTRAST  TECHNIQUE:  Multidetector CT imaging of the abdomen and pelvis was performed  using the standard protocol following bolus administration of  intravenous contrast.  CONTRAST: 150mL OMNIPAQUE IOHEXOL 300 MG/ML SOLN  COMPARISON: None  FINDINGS:  The lung bases are clear. No pleural effusion noted.  Low-attenuation structure within the posterior right hepatic lobe  measures 9 mm. This measures 37 Hounsfield units. No additional  liver abnormalities noted. The gallbladder appears normal. No  biliary dilatation. Normal appearance of the pancreas. The spleen  appears normal. The adrenal glands are both normal. 4 mm cyst is too  small to reliably characterize arising from the inferior pole of the  right kidney. The left kidney is normal. The urinary bladder appears  within normal limits. The uterus and adnexal structures are both  unremarkable. Normal caliber of the abdominal aorta. No aneurysm.  There is no upper abdominal adenopathy. No pelvic or inguinal  adenopathy. The uterus and adnexal structures are both unremarkable.  No free  fluid or fluid collections within the abdomen or pelvis. The  stomach appears normal. The small bowel loops have a normal course  and caliber without evidence for bowel obstruction. A normal  appearing appendix is identified within the left side of pelvis,  image 74/series 3. The colon appears within normal limits. No  evidence for diverticulitis.  Review of the visualized bony structures is negative.  IMPRESSION:  1. No acute findings within the abdomen or pelvis. No explanation  for patient's left lower quadrant pain and vomiting  2. Indeterminate, low to intermediate attenuation structure in the  right hepatic lobe measures 9 mm. Followup examination in 6 months  is recommended. At this time the study of choice would be a contrast  enhanced MRI of the liver.    Current Outpatient Prescriptions on File Prior to Visit  Medication Sig Dispense Refill  . Cholecalciferol (VITAMIN D3) 1000 UNITS CAPS Take by mouth. 3000 units daily      . fexofenadine (ALLEGRA) 180 MG tablet Take 180 mg by mouth daily.      Marland Kitchen gabapentin (NEURONTIN) 600 MG tablet Take 1 tablet (600 mg total) by mouth 3 (three) times daily.  90 tablet  6  . medroxyPROGESTERone (DEPO-PROVERA) 150 MG/ML injection Inject 150 mg into the muscle every 3 (three) months.      Marland Kitchen oxyCODONE-acetaminophen (PERCOCET/ROXICET) 5-325 MG per tablet Take 1 tablet by mouth 3 (three) times daily.  90 tablet  0  . promethazine (PHENERGAN) 25 MG tablet Take 1 tablet (25 mg total) by mouth every 8 (eight) hours as needed for nausea or  vomiting.  15 tablet  0  . ranitidine (ZANTAC) 150 MG tablet Take 150 mg by mouth daily.      . SUMAtriptan (IMITREX) 100 MG tablet Take 1 tablet (100 mg total) by mouth every 2 (two) hours as needed for migraine or headache. May repeat in 2 hours if headache persists or recurs.  10 tablet  0  . venlafaxine XR (EFFEXOR-XR) 75 MG 24 hr capsule Take 75 mg by mouth every morning.       . vitamin B-12 (CYANOCOBALAMIN) 1000  MCG tablet Take 1,000 mcg by mouth daily.      . ziprasidone (GEODON) 60 MG capsule Take 60 mg by mouth daily.       No current facility-administered medications on file prior to visit.    Allergies  Allergen Reactions  . Azithromycin     rash  . Depakote [Divalproex Sodium]     Hair loss  . Lithium     Rash  . Penicillins     rash  . Seroquel [Quetiapine Fumarate]     irritable  . Sulfa Antibiotics     Rash  . Pregabalin Palpitations    Manic symptoms, no sleep x's 3 days, no appetite.    Past Medical History  Diagnosis Date  . Bipolar disorder   . Fibromyalgia     on disability  . Migraine   . Panic attack   . Muscle spasm   . Uterine perforation     hx of  . Syncope     recurrent  . POTS (postural orthostatic tachycardia syndrome)   . Pain of right side of body     chronic    Past Surgical History  Procedure Laterality Date  . Dilation and curettage of uterus    . Cataract extraction  dec 2014  . Lasik  dec 2014    Family History  Problem Relation Age of Onset  . Diabetes Mother   . Hypertension Mother   . Cancer Father     brain tumor  . Cancer Maternal Grandmother   . Cancer Maternal Grandfather   . Cancer Paternal Grandmother   . Cancer Paternal Grandfather     History   Social History  . Marital Status: Married    Spouse Name: [name]    Number of Children: 0  . Years of Education: HS/college   Occupational History  .  Other    disabled   Social History Main Topics  . Smoking status: Never Smoker   . Smokeless tobacco: Never Used  . Alcohol Use: No     Comment: 1 holiday drink yearly  . Drug Use: No  . Sexual Activity: Not on file   Other Topics Concern  . Not on file   Social History Narrative   Patient is married [name]) and lives at home with her husband.   Married 20+ years   From [place]   On disability for fibromyalgia   Caffeine Use: 1 soda daily   The PMH, PSH, Social History, Family History, Medications, and  allergies have been reviewed in [facility], and have been updated if relevant.   Review of Systems See HPI No HA- episodes not associated with her migraines No seizure like activity No CP or SOB    Objective:    BP 146/82  Pulse 87  Temp(Src) 98.1 F (36.7 C) (Oral)  Wt 166 lb 8 oz (75.524 kg)  SpO2 98%   Physical Exam  Nursing note and vitals reviewed. Constitutional: She  appears well-developed and well-nourished. No distress.  Neurological: She is alert.  Skin: Skin is warm and dry.  Psychiatric: She has a normal mood and affect. Her behavior is normal. Judgment and thought content normal.          Assessment & Plan:   Intractable cyclical vomiting with nausea No Follow-up on file.

## 2014-02-20 NOTE — Assessment & Plan Note (Signed)
Cyclical in nature. >25 minutes spent in face to face time with patient and her husband, >50% spent in counselling or coordination of care. Reviewed side effects of Geodon- this could be contributing factor- advised touching base with her psychiatrist. Also POTs could be part of this issue as well. She will follow up with me after she talks with psychiatrist.

## 2014-02-20 NOTE — Progress Notes (Signed)
Pre visit review using our clinic review tool, if applicable. No additional management support is needed unless otherwise documented below in the visit note. 

## 2014-02-21 ENCOUNTER — Encounter (HOSPITAL_COMMUNITY): Payer: Self-pay | Admitting: Emergency Medicine

## 2014-02-21 ENCOUNTER — Emergency Department (HOSPITAL_COMMUNITY)
Admission: EM | Admit: 2014-02-21 | Discharge: 2014-02-21 | Disposition: A | Discharge status: Home / Self Care | Payer: Medicare Other | Attending: Emergency Medicine | Admitting: Emergency Medicine

## 2014-02-21 DIAGNOSIS — G43909 Migraine, unspecified, not intractable, without status migrainosus: Secondary | ICD-10-CM | POA: Insufficient documentation

## 2014-02-21 DIAGNOSIS — Z880 Allergy status to penicillin: Secondary | ICD-10-CM | POA: Insufficient documentation

## 2014-02-21 DIAGNOSIS — Z8679 Personal history of other diseases of the circulatory system: Secondary | ICD-10-CM | POA: Diagnosis not present

## 2014-02-21 DIAGNOSIS — IMO0001 Reserved for inherently not codable concepts without codable children: Secondary | ICD-10-CM | POA: Insufficient documentation

## 2014-02-21 DIAGNOSIS — F319 Bipolar disorder, unspecified: Secondary | ICD-10-CM | POA: Diagnosis not present

## 2014-02-21 DIAGNOSIS — F410 Panic disorder [episodic paroxysmal anxiety] without agoraphobia: Secondary | ICD-10-CM | POA: Insufficient documentation

## 2014-02-21 DIAGNOSIS — R55 Syncope and collapse: Secondary | ICD-10-CM | POA: Diagnosis present

## 2014-02-21 DIAGNOSIS — E860 Dehydration: Secondary | ICD-10-CM

## 2014-02-21 DIAGNOSIS — R000 Tachycardia, unspecified: Secondary | ICD-10-CM | POA: Diagnosis not present

## 2014-02-21 DIAGNOSIS — Z79899 Other long term (current) drug therapy: Secondary | ICD-10-CM | POA: Diagnosis not present

## 2014-02-21 DIAGNOSIS — Z3202 Encounter for pregnancy test, result negative: Secondary | ICD-10-CM | POA: Insufficient documentation

## 2014-02-21 LAB — CBC
HCT: 41 % (ref 36.0–46.0)
HEMOGLOBIN: 14 g/dL (ref 12.0–15.0)
MCH: 28.5 pg (ref 26.0–34.0)
MCHC: 34.1 g/dL (ref 30.0–36.0)
MCV: 83.3 fL (ref 78.0–100.0)
Platelets: 265 10*3/uL (ref 150–400)
RBC: 4.92 MIL/uL (ref 3.87–5.11)
RDW: 13.3 % (ref 11.5–15.5)
WBC: 8.1 10*3/uL (ref 4.0–10.5)

## 2014-02-21 LAB — CBG MONITORING, ED: GLUCOSE-CAPILLARY: 90 mg/dL (ref 70–99)

## 2014-02-21 LAB — I-STAT CHEM 8, ED
BUN: 6 mg/dL (ref 6–23)
CREATININE: 1 mg/dL (ref 0.50–1.10)
Calcium, Ion: 1.17 mmol/L (ref 1.12–1.23)
Chloride: 111 mEq/L (ref 96–112)
Glucose, Bld: 115 mg/dL — ABNORMAL HIGH (ref 70–99)
HEMATOCRIT: 42 % (ref 36.0–46.0)
HEMOGLOBIN: 14.3 g/dL (ref 12.0–15.0)
Potassium: 3.2 mEq/L — ABNORMAL LOW (ref 3.7–5.3)
SODIUM: 143 meq/L (ref 137–147)
TCO2: 19 mmol/L (ref 0–100)

## 2014-02-21 LAB — POC URINE PREG, ED: Preg Test, Ur: NEGATIVE

## 2014-02-21 MED ORDER — SODIUM CHLORIDE 0.9 % IV SOLN
INTRAVENOUS | Status: DC
  Administered 2014-02-21: 17:00:00 via INTRAVENOUS

## 2014-02-21 MED ORDER — SODIUM CHLORIDE 0.9 % IV BOLUS (SEPSIS)
1000.0000 mL | Freq: Once | INTRAVENOUS | Status: AC
  Administered 2014-02-21: 1000 mL via INTRAVENOUS

## 2014-02-21 MED ORDER — POTASSIUM CHLORIDE CRYS ER 20 MEQ PO TBCR
40.0000 meq | EXTENDED_RELEASE_TABLET | Freq: Once | ORAL | Status: AC
  Administered 2014-02-21: 40 meq via ORAL
  Filled 2014-02-21: qty 2

## 2014-02-21 NOTE — ED Notes (Signed)
Pt states that she has "POTS and has been sick with diarrhea and believe i am dehydrated bc i feel like i am going to pass out."

## 2014-02-21 NOTE — Discharge Instructions (Signed)
Dehydration, Adult Dehydration is when you lose more fluids from the body than you take in. Vital organs like the kidneys, brain, and heart cannot function without a proper amount of fluids and salt. Any loss of fluids from the body can cause dehydration.  CAUSES   Vomiting.  Diarrhea.  Excessive sweating.  Excessive urine output.  Fever. SYMPTOMS  Mild dehydration  Thirst.  Dry lips.  Slightly dry mouth. Moderate dehydration  Very dry mouth.  Sunken eyes.  Skin does not bounce back quickly when lightly pinched and released.  Dark urine and decreased urine production.  Decreased tear production.  Headache. Severe dehydration  Very dry mouth.  Extreme thirst.  Rapid, weak pulse (more than 100 beats per minute at rest).  Cold hands and feet.  Not able to sweat in spite of heat and temperature.  Rapid breathing.  Blue lips.  Confusion and lethargy.  Difficulty being awakened.  Minimal urine production.  No tears. DIAGNOSIS  Your caregiver will diagnose dehydration based on your symptoms and your exam. Blood and urine tests will help confirm the diagnosis. The diagnostic evaluation should also identify the cause of dehydration. TREATMENT  Treatment of mild or moderate dehydration can often be done at home by increasing the amount of fluids that you drink. It is best to drink small amounts of fluid more often. Drinking too much at one time can make vomiting worse. Refer to the home care instructions below. Severe dehydration needs to be treated at the hospital where you will probably be given intravenous (IV) fluids that contain water and electrolytes. HOME CARE INSTRUCTIONS   Ask your caregiver about specific rehydration instructions.  Drink enough fluids to keep your urine clear or pale yellow.  Drink small amounts frequently if you have nausea and vomiting.  Eat as you normally do.  Avoid:  Foods or drinks high in sugar.  Carbonated  drinks.  Juice.  Extremely hot or cold fluids.  Drinks with caffeine.  Fatty, greasy foods.  Alcohol.  Tobacco.  Overeating.  Gelatin desserts.  Wash your hands well to avoid spreading bacteria and viruses.  Only take over-the-counter or prescription medicines for pain, discomfort, or fever as directed by your caregiver.  Ask your caregiver if you should continue all prescribed and over-the-counter medicines.  Keep all follow-up appointments with your caregiver. SEEK MEDICAL CARE IF:  You have abdominal pain and it increases or stays in one area (localizes).  You have a rash, stiff neck, or severe headache.  You are irritable, sleepy, or difficult to awaken.  You are weak, dizzy, or extremely thirsty. SEEK IMMEDIATE MEDICAL CARE IF:   You are unable to keep fluids down or you get worse despite treatment.  You have frequent episodes of vomiting or diarrhea.  You have blood or green matter (bile) in your vomit.  You have blood in your stool or your stool looks black and tarry.  You have not urinated in 6 to 8 hours, or you have only urinated a small amount of very dark urine.  You have a fever.  You faint. MAKE SURE YOU:   Understand these instructions.  Will watch your condition.  Will get help right away if you are not doing well or get worse. Document Released: 06/07/2005 Document Revised: 08/30/2011 Document Reviewed: 01/25/2011 Exit[employer] Patient Information 2015 [employer]. This information is not intended to replace advice given to you by your health care provider. Make sure you discuss any questions you have with your health care   provider.  

## 2014-02-21 NOTE — ED Provider Notes (Signed)
CSN: [id number]     Arrival date & time 02/21/14  1336 History   First MD Initiated Contact with Patient 02/21/14 1512     Chief Complaint  Patient presents with  . dehydrated   . Near Syncope     (Consider location/radiation/quality/duration/timing/severity/associated sxs/prior Treatment) HPI Comments: Patient here complaining of dizziness and near syncope. History of pots syndrome and states that any medications also have nausea and vomiting and diarrhea. Denies any fever chills. States that her vomiting diarrhea been nonbilious. No blood noted. Denies any abdominal or chest pain. Those symptoms have resolved but she still feels as if she is going to pass out when she stands up. She is requesting IV fluids.  Patient is a 43 y.o. female presenting with near-syncope. The history is provided by the patient and the spouse.  Near Syncope    Past Medical History  Diagnosis Date  . Bipolar disorder   . Fibromyalgia     on disability  . Migraine   . Panic attack   . Muscle spasm   . Uterine perforation     hx of  . Syncope     recurrent  . POTS (postural orthostatic tachycardia syndrome)   . Pain of right side of body     chronic   Past Surgical History  Procedure Laterality Date  . Dilation and curettage of uterus    . Cataract extraction  dec 2014  . Lasik  dec 2014   Family History  Problem Relation Age of Onset  . Diabetes Mother   . Hypertension Mother   . Cancer Father     brain tumor  . Cancer Maternal Grandmother   . Cancer Maternal Grandfather   . Cancer Paternal Grandmother   . Cancer Paternal Grandfather    History  Substance Use Topics  . Smoking status: Never Smoker   . Smokeless tobacco: Never Used  . Alcohol Use: No     Comment: 1 holiday drink yearly   OB History   Grav Para Term Preterm Abortions TAB SAB Ect Mult Living                 Review of Systems  Cardiovascular: Positive for near-syncope.  All other systems reviewed and are  negative.     Allergies  Azithromycin; Depakote; Lithium; Penicillins; Seroquel; Sulfa antibiotics; and Pregabalin  Home Medications   Prior to Admission medications   Medication Sig Start Date End Date Taking? Authorizing Provider  Cholecalciferol (VITAMIN D3) 1000 UNITS CAPS Take by mouth. 3000 units daily    Historical Provider, MD  clonazePAM (KLONOPIN) 1 MG tablet TAKE 1 TABLET BY MOUTH TWICE A DAILY FORANXIETY 02/20/14   Lucille Passy, MD  fexofenadine (ALLEGRA) 180 MG tablet Take 180 mg by mouth daily.    Historical Provider, MD  gabapentin (NEURONTIN) 600 MG tablet Take 1 tablet (600 mg total) by mouth 3 (three) times daily. 02/12/14   Hulen Luster, DO  medroxyPROGESTERone (DEPO-PROVERA) 150 MG/ML injection Inject 150 mg into the muscle every 3 (three) months.    Historical Provider, MD  oxyCODONE-acetaminophen (PERCOCET/ROXICET) 5-325 MG per tablet Take 1 tablet by mouth 3 (three) times daily. 01/07/14   Lucille Passy, MD  promethazine (PHENERGAN) 25 MG tablet Take 1 tablet (25 mg total) by mouth every 8 (eight) hours as needed for nausea or vomiting. 09/25/13   [place], PA-C  ranitidine (ZANTAC) 150 MG tablet Take 150 mg by mouth daily.    Historical Provider,  MD  SUMAtriptan (IMITREX) 100 MG tablet Take 1 tablet (100 mg total) by mouth every 2 (two) hours as needed for migraine or headache. May repeat in 2 hours if headache persists or recurs. 02/07/14   Jearld Fenton, NP  tiZANidine (ZANAFLEX) 4 MG tablet Take 1 tablet (4 mg total) by mouth every 8 (eight) hours as needed for muscle spasms. 02/20/14   Lucille Passy, MD  topiramate (TOPAMAX) 50 MG tablet Take 2 tablets (100 mg total) by mouth daily. 02/20/14   Lucille Passy, MD  venlafaxine XR (EFFEXOR-XR) 75 MG 24 hr capsule Take 75 mg by mouth every morning.     Historical Provider, MD  vitamin B-12 (CYANOCOBALAMIN) 1000 MCG tablet Take 1,000 mcg by mouth daily.    Historical Provider, MD  ziprasidone (GEODON) 60 MG  capsule Take 60 mg by mouth daily.    Historical Provider, MD   BP 134/88  Pulse 110  Temp(Src) 98.5 F (36.9 C) (Oral)  Resp 18  SpO2 98% Physical Exam  Nursing note and vitals reviewed. Constitutional: She is oriented to person, place, and time. She appears well-developed and well-nourished.  Non-toxic appearance. No distress.  HENT:  Head: Normocephalic and atraumatic.  Eyes: Conjunctivae, EOM and lids are normal. Pupils are equal, round, and reactive to light.  Neck: Normal range of motion. Neck supple. No tracheal deviation present. No mass present.  Cardiovascular: Regular rhythm and normal heart sounds.  Tachycardia present.  Exam reveals no gallop.   No murmur heard. Pulmonary/Chest: Effort normal and breath sounds normal. No stridor. No respiratory distress. She has no decreased breath sounds. She has no wheezes. She has no rhonchi. She has no rales.  Abdominal: Soft. Normal appearance and bowel sounds are normal. She exhibits no distension. There is no tenderness. There is no rebound and no CVA tenderness.  Musculoskeletal: Normal range of motion. She exhibits no edema and no tenderness.  Neurological: She is alert and oriented to person, place, and time. She has normal strength. No cranial nerve deficit or sensory deficit. GCS eye subscore is 4. GCS verbal subscore is 5. GCS motor subscore is 6.  Skin: Skin is warm and dry. No abrasion and no rash noted.  Psychiatric: She has a normal mood and affect. Her speech is normal and behavior is normal.    ED Course  Procedures (including critical care time) Labs Review Labs Reviewed  I-STAT CHEM 8, ED - Abnormal; Notable for the following:    Potassium 3.2 (*)    Glucose, Bld 115 (*)    All other components within normal limits  CBC  CBG MONITORING, ED  POC URINE PREG, ED    Imaging Review No results found.   EKG Interpretation   Date/Time:  Thursday February 21 2014 14:01:31 EDT Ventricular Rate:  111 PR Interval:   146 QRS Duration: 75 QT Interval:  336 QTC Calculation: 457 R Axis:   153 Text Interpretation:  Sinus tachycardia Right axis deviation Confirmed by  Chyanna Flock  MD, Bryna Razavi ([id number]) on 02/21/2014 3:12:48 PM      MDM   Final diagnoses:  None     Patient given IV fluids here feels better. Orthostatics are negative and she is stable for discharge   Leota Jacobsen, MD 02/21/14 1714

## 2014-02-27 ENCOUNTER — Encounter (HOSPITAL_COMMUNITY)
Admission: RE | Admit: 2014-02-27 | Discharge: 2014-02-27 | Disposition: A | Discharge status: Home / Self Care | Payer: Medicare Other | Source: Ambulatory Visit | Attending: Internal Medicine | Admitting: Internal Medicine

## 2014-02-27 DIAGNOSIS — I951 Orthostatic hypotension: Secondary | ICD-10-CM | POA: Insufficient documentation

## 2014-02-27 DIAGNOSIS — I498 Other specified cardiac arrhythmias: Secondary | ICD-10-CM | POA: Insufficient documentation

## 2014-02-27 MED ORDER — SODIUM CHLORIDE 0.9 % IV SOLN
Freq: Once | INTRAVENOUS | Status: AC
  Administered 2014-02-27: 2000 mL via INTRAVENOUS

## 2014-03-07 ENCOUNTER — Other Ambulatory Visit (HOSPITAL_COMMUNITY): Payer: Self-pay | Admitting: *Deleted

## 2014-03-08 ENCOUNTER — Encounter (HOSPITAL_COMMUNITY)
Admission: RE | Admit: 2014-03-08 | Discharge: 2014-03-08 | Disposition: A | Discharge status: Home / Self Care | Payer: Medicare Other | Source: Ambulatory Visit | Attending: Internal Medicine | Admitting: Internal Medicine

## 2014-03-08 DIAGNOSIS — I498 Other specified cardiac arrhythmias: Secondary | ICD-10-CM | POA: Diagnosis not present

## 2014-03-08 MED ORDER — SODIUM CHLORIDE 0.9 % IV SOLN
INTRAVENOUS | Status: DC
Start: 2014-03-08 — End: 2014-03-09
  Administered 2014-03-08 (×2): via INTRAVENOUS

## 2014-03-11 ENCOUNTER — Ambulatory Visit: Payer: Medicare Other | Admitting: Neurology

## 2014-03-12 ENCOUNTER — Encounter: Payer: Self-pay | Admitting: Family Medicine

## 2014-03-12 ENCOUNTER — Ambulatory Visit (INDEPENDENT_AMBULATORY_CARE_PROVIDER_SITE_OTHER): Payer: Medicare Other | Admitting: Family Medicine

## 2014-03-12 VITALS — BP 100/62 | HR 93 | Temp 97.8°F | Wt 166.5 lb

## 2014-03-12 DIAGNOSIS — K589 Irritable bowel syndrome without diarrhea: Secondary | ICD-10-CM

## 2014-03-12 DIAGNOSIS — I498 Other specified cardiac arrhythmias: Secondary | ICD-10-CM

## 2014-03-12 DIAGNOSIS — R1115 Cyclical vomiting syndrome unrelated to migraine: Secondary | ICD-10-CM

## 2014-03-12 DIAGNOSIS — I951 Orthostatic hypotension: Secondary | ICD-10-CM

## 2014-03-12 DIAGNOSIS — IMO0001 Reserved for inherently not codable concepts without codable children: Secondary | ICD-10-CM

## 2014-03-12 DIAGNOSIS — R5381 Other malaise: Secondary | ICD-10-CM

## 2014-03-12 DIAGNOSIS — F314 Bipolar disorder, current episode depressed, severe, without psychotic features: Secondary | ICD-10-CM

## 2014-03-12 DIAGNOSIS — R5383 Other fatigue: Secondary | ICD-10-CM

## 2014-03-12 DIAGNOSIS — G90A Postural orthostatic tachycardia syndrome (POTS): Secondary | ICD-10-CM

## 2014-03-12 DIAGNOSIS — M797 Fibromyalgia: Secondary | ICD-10-CM

## 2014-03-12 DIAGNOSIS — E538 Deficiency of other specified B group vitamins: Secondary | ICD-10-CM

## 2014-03-12 DIAGNOSIS — Z309 Encounter for contraceptive management, unspecified: Secondary | ICD-10-CM

## 2014-03-12 DIAGNOSIS — R000 Tachycardia, unspecified: Secondary | ICD-10-CM

## 2014-03-12 LAB — TSH: TSH: 1.27 u[IU]/mL (ref 0.35–4.50)

## 2014-03-12 LAB — T4, FREE: Free T4: 0.73 ng/dL (ref 0.60–1.60)

## 2014-03-12 LAB — VITAMIN B12: VITAMIN B 12: 421 pg/mL (ref 211–911)

## 2014-03-12 MED ORDER — MEDROXYPROGESTERONE ACETATE 150 MG/ML IM SUSP
150.0000 mg | Freq: Once | INTRAMUSCULAR | Status: AC
  Administered 2014-03-12: 150 mg via INTRAMUSCULAR

## 2014-03-12 NOTE — Assessment & Plan Note (Signed)
Persistent. Likely related to IBS and changes in rxs. Will check h pylori today. Advised GI follow up if this continues. She declined starting probiotic as they have caused her diarrhea in the past.

## 2014-03-12 NOTE — Assessment & Plan Note (Signed)
Deteriorated and not currently taking any rx. Advised immediate follow up with psychiatry. The patient indicates understanding of these issues and agrees with the plan.

## 2014-03-12 NOTE — Progress Notes (Signed)
Subjective:   Patient ID: Melissa [name], female    DOB: 1971/02/17, 43 y.o.   MRN: [id number]  Melissa [name] is a pleasant 43 y.o. year old female who presents to clinic today with Follow-up and Contraception  on 0/27/7412  HPI: Very complicated medical history, including POTS, fibromyalgia, bipolar disorder.  Past several months, nausea and vomiting. She also gets very diaphoretic and has diarrhea associated with these episodes. She thinks perhaps she has had syncope with some of the episodes.  Only change is she started Geodon through her psychiatrist shortly before these episodes began.  She has since stopped taking this and effexor with no improvement of symptoms.   CT of abdomen done:  EXAM:  CT ABDOMEN AND PELVIS WITH CONTRAST  TECHNIQUE:  Multidetector CT imaging of the abdomen and pelvis was performed  using the standard protocol following bolus administration of  intravenous contrast.  CONTRAST: 110mL OMNIPAQUE IOHEXOL 300 MG/ML SOLN  COMPARISON: None  FINDINGS:  The lung bases are clear. No pleural effusion noted.  Low-attenuation structure within the posterior right hepatic lobe  measures 9 mm. This measures 37 Hounsfield units. No additional  liver abnormalities noted. The gallbladder appears normal. No  biliary dilatation. Normal appearance of the pancreas. The spleen  appears normal. The adrenal glands are both normal. 4 mm cyst is too  small to reliably characterize arising from the inferior pole of the  right kidney. The left kidney is normal. The urinary bladder appears  within normal limits. The uterus and adnexal structures are both  unremarkable. Normal caliber of the abdominal aorta. No aneurysm.  There is no upper abdominal adenopathy. No pelvic or inguinal  adenopathy. The uterus and adnexal structures are both unremarkable.  No free fluid or fluid collections within the abdomen or pelvis. The  stomach appears normal. The small bowel loops have a  normal course  and caliber without evidence for bowel obstruction. A normal  appearing appendix is identified within the left side of pelvis,  image 74/series 3. The colon appears within normal limits. No  evidence for diverticulitis.  Review of the visualized bony structures is negative.  IMPRESSION:  1. No acute findings within the abdomen or pelvis. No explanation  for patient's left lower quadrant pain and vomiting  2. Indeterminate, low to intermediate attenuation structure in the  right hepatic lobe measures 9 mm. Followup examination in 6 months  is recommended. At this time the study of choice would be a contrast  enhanced MRI of the liver.   Feels much more depressed. Fibromyalgia pain is worse as well. Denies SI or HI but does not "want to do anything."  Has appt in syncope clinic 10/2. Current Outpatient Prescriptions on File Prior to Visit  Medication Sig Dispense Refill  . acetaminophen (TYLENOL) 500 MG tablet Take 500-1,000 mg by mouth every 6 (six) hours as needed for mild pain.      . Cholecalciferol (VITAMIN D3) 1000 UNITS CAPS Take 3,000 Units by mouth daily.       . clonazePAM (KLONOPIN) 1 MG tablet Take 1 mg by mouth 3 (three) times daily as needed for anxiety.      . fexofenadine (ALLEGRA) 180 MG tablet Take 180 mg by mouth daily.      Marland Kitchen gabapentin (NEURONTIN) 600 MG tablet Take 1 tablet (600 mg total) by mouth 3 (three) times daily.  90 tablet  6  . medroxyPROGESTERone (DEPO-PROVERA) 150 MG/ML injection Inject 150 mg into the muscle every  3 (three) months.      Marland Kitchen oxyCODONE-acetaminophen (PERCOCET/ROXICET) 5-325 MG per tablet Take 1 tablet by mouth 3 (three) times daily as needed for severe pain.      . promethazine (PHENERGAN) 25 MG tablet Take 1 tablet (25 mg total) by mouth every 8 (eight) hours as needed for nausea or vomiting.  15 tablet  0  . ranitidine (ZANTAC) 150 MG tablet Take 150 mg by mouth daily.      . SUMAtriptan (IMITREX) 100 MG tablet Take 1 tablet  (100 mg total) by mouth every 2 (two) hours as needed for migraine or headache. May repeat in 2 hours if headache persists or recurs.  10 tablet  0  . tiZANidine (ZANAFLEX) 4 MG tablet Take 1 tablet (4 mg total) by mouth every 8 (eight) hours as needed for muscle spasms.  30 tablet  1  . topiramate (TOPAMAX) 50 MG tablet Take 2 tablets (100 mg total) by mouth daily.  60 tablet  3  . venlafaxine XR (EFFEXOR-XR) 37.5 MG 24 hr capsule Take 37.5 mg by mouth daily with breakfast.      . vitamin B-12 (CYANOCOBALAMIN) 1000 MCG tablet Take 1,000 mcg by mouth daily.       No current facility-administered medications on file prior to visit.    Allergies  Allergen Reactions  . Depakote [Divalproex Sodium] Other (See Comments)    Hair loss  . Seroquel [Quetiapine Fumarate] Other (See Comments)    irritable  . Azithromycin Rash  . Lithium Rash  . Penicillins Rash  . Pregabalin Palpitations    Manic symptoms, no sleep x's 3 days, no appetite.  . Sulfa Antibiotics Rash    Past Medical History  Diagnosis Date  . Bipolar disorder   . Fibromyalgia     on disability  . Migraine   . Panic attack   . Muscle spasm   . Uterine perforation     hx of  . Syncope     recurrent  . POTS (postural orthostatic tachycardia syndrome)   . Pain of right side of body     chronic    Past Surgical History  Procedure Laterality Date  . Dilation and curettage of uterus    . Cataract extraction  dec 2014  . Lasik  dec 2014    Family History  Problem Relation Age of Onset  . Diabetes Mother   . Hypertension Mother   . Cancer Father     brain tumor  . Cancer Maternal Grandmother   . Cancer Maternal Grandfather   . Cancer Paternal Grandmother   . Cancer Paternal Grandfather     History   Social History  . Marital Status: Married    Spouse Name: Melissa [name]    Number of Children: 0  . Years of Education: HS/college   Occupational History  .  Other    disabled   Social History Main Topics  .  Smoking status: Never Smoker   . Smokeless tobacco: Never Used  . Alcohol Use: No     Comment: 1 holiday drink yearly  . Drug Use: No  . Sexual Activity: Not on file   Other Topics Concern  . Not on file   Social History Narrative   Patient is married [place]) and lives at home with her husband.   Married 20+ years   From [place]   On disability for fibromyalgia   Caffeine Use: 1 soda daily   The PMH, PSH, Social History, Family History, Medications,  and allergies have been reviewed in [facility], and have been updated if relevant.   Review of Systems  Constitutional: Positive for diaphoresis and fatigue. Negative for fever and appetite change.  Cardiovascular: Negative.   Gastrointestinal: Negative for abdominal pain.  Psychiatric/Behavioral: Negative for suicidal ideas, sleep disturbance and self-injury. The patient is not nervous/anxious.    See HPI No HA- episodes not associated with her migraines No seizure like activity No CP or SOB   Abdominal pain has resolved +fatigue Objective:    BP 100/62  Pulse 93  Temp(Src) 97.8 F (36.6 C) (Oral)  Wt 166 lb 8 oz (75.524 kg)  SpO2 98%   Physical Exam  Nursing note and vitals reviewed. Constitutional: She appears well-developed and well-nourished. No distress.  HENT:  Head: Normocephalic.  Abdominal: Soft. Bowel sounds are normal.  Musculoskeletal: Normal range of motion.  Neurological: She is alert.  Skin: Skin is warm and dry.  Psychiatric: She has a normal mood and affect. Her behavior is normal. Judgment and thought content normal.          Assessment & Plan:   IBS (irritable bowel syndrome) - Plan: H Pylori, IGM, IGG, IGA AB  Vitamin B12 deficiency - Plan: Vitamin B12  Other malaise and fatigue - Plan: TSH, T4, Free  Fibromyalgia - Plan: Ambulatory referral to Pain Clinic  Unspecified contraceptive management - Plan: medroxyPROGESTERone (DEPO-PROVERA) injection 150 mg No Follow-up on file.

## 2014-03-12 NOTE — Assessment & Plan Note (Signed)
Taking OTC supplements. Will recheck B12 today.

## 2014-03-12 NOTE — Progress Notes (Signed)
Pre visit review using our clinic review tool, if applicable. No additional management support is needed unless otherwise documented below in the visit note. 

## 2014-03-12 NOTE — Assessment & Plan Note (Signed)
Last went to ER at the beginning of month for dehydration. Advised to keep appt at syncope clinic.

## 2014-03-12 NOTE — Patient Instructions (Signed)
It was great to see you. Hang in there. Call your psychiatrist.  I will call you with your results.

## 2014-03-25 ENCOUNTER — Telehealth: Payer: Self-pay | Admitting: Family Medicine

## 2014-03-25 NOTE — Telephone Encounter (Signed)
Noted! Thank you

## 2014-03-25 NOTE — Telephone Encounter (Signed)
Called the patient about her husbands referral and she tells me to cancel the Referral to [facility] Wilford Grist that we requested last week. She says she is seeing [facility] and likes it very much. I will cancel the Referral, just wanted you to be aware of who she is now seeing.

## 2014-04-02 ENCOUNTER — Other Ambulatory Visit (HOSPITAL_COMMUNITY): Payer: Self-pay | Admitting: *Deleted

## 2014-04-03 ENCOUNTER — Encounter (HOSPITAL_COMMUNITY)
Admission: RE | Admit: 2014-04-03 | Discharge: 2014-04-03 | Disposition: A | Discharge status: Home / Self Care | Payer: Medicare Other | Source: Ambulatory Visit | Attending: Internal Medicine | Admitting: Internal Medicine

## 2014-04-03 DIAGNOSIS — I498 Other specified cardiac arrhythmias: Secondary | ICD-10-CM | POA: Diagnosis not present

## 2014-04-03 DIAGNOSIS — I951 Orthostatic hypotension: Secondary | ICD-10-CM | POA: Insufficient documentation

## 2014-04-03 MED ORDER — SODIUM CHLORIDE 0.9 % IV SOLN
INTRAVENOUS | Status: DC
  Administered 2014-04-03: 08:00:00 via INTRAVENOUS

## 2014-04-15 ENCOUNTER — Other Ambulatory Visit (HOSPITAL_COMMUNITY): Payer: Self-pay | Admitting: *Deleted

## 2014-04-16 ENCOUNTER — Encounter (HOSPITAL_COMMUNITY)
Admission: RE | Admit: 2014-04-16 | Discharge: 2014-04-16 | Disposition: A | Discharge status: Home / Self Care | Payer: Medicare Other | Source: Ambulatory Visit | Attending: Cardiology | Admitting: Cardiology

## 2014-04-16 DIAGNOSIS — I498 Other specified cardiac arrhythmias: Secondary | ICD-10-CM | POA: Diagnosis not present

## 2014-04-16 MED ORDER — SODIUM CHLORIDE 0.9 % IV SOLN
INTRAVENOUS | Status: AC
  Administered 2014-04-16: 08:00:00 via INTRAVENOUS

## 2014-04-17 ENCOUNTER — Encounter (HOSPITAL_COMMUNITY): Payer: Medicare Other

## 2014-04-21 ENCOUNTER — Emergency Department (HOSPITAL_COMMUNITY)
Admission: EM | Admit: 2014-04-21 | Discharge: 2014-04-21 | Disposition: A | Discharge status: Home / Self Care | Payer: Medicare Other | Attending: Emergency Medicine | Admitting: Emergency Medicine

## 2014-04-21 ENCOUNTER — Emergency Department (HOSPITAL_COMMUNITY): Payer: Medicare Other

## 2014-04-21 ENCOUNTER — Encounter (HOSPITAL_COMMUNITY): Payer: Self-pay | Admitting: Emergency Medicine

## 2014-04-21 DIAGNOSIS — F319 Bipolar disorder, unspecified: Secondary | ICD-10-CM | POA: Diagnosis not present

## 2014-04-21 DIAGNOSIS — H53133 Sudden visual loss, bilateral: Secondary | ICD-10-CM | POA: Diagnosis not present

## 2014-04-21 DIAGNOSIS — Z880 Allergy status to penicillin: Secondary | ICD-10-CM | POA: Insufficient documentation

## 2014-04-21 DIAGNOSIS — Z79899 Other long term (current) drug therapy: Secondary | ICD-10-CM | POA: Diagnosis not present

## 2014-04-21 DIAGNOSIS — G43909 Migraine, unspecified, not intractable, without status migrainosus: Secondary | ICD-10-CM | POA: Diagnosis not present

## 2014-04-21 DIAGNOSIS — Z7952 Long term (current) use of systemic steroids: Secondary | ICD-10-CM | POA: Diagnosis not present

## 2014-04-21 DIAGNOSIS — F410 Panic disorder [episodic paroxysmal anxiety] without agoraphobia: Secondary | ICD-10-CM | POA: Diagnosis not present

## 2014-04-21 DIAGNOSIS — Z8679 Personal history of other diseases of the circulatory system: Secondary | ICD-10-CM | POA: Diagnosis not present

## 2014-04-21 DIAGNOSIS — M797 Fibromyalgia: Secondary | ICD-10-CM | POA: Insufficient documentation

## 2014-04-21 DIAGNOSIS — M1990 Unspecified osteoarthritis, unspecified site: Secondary | ICD-10-CM | POA: Insufficient documentation

## 2014-04-21 DIAGNOSIS — H547 Unspecified visual loss: Secondary | ICD-10-CM | POA: Diagnosis present

## 2014-04-21 HISTORY — DX: Unspecified osteoarthritis, unspecified site: M19.90

## 2014-04-21 LAB — CBC WITH DIFFERENTIAL/PLATELET
Basophils Absolute: 0.1 K/uL (ref 0.0–0.1)
Basophils Relative: 1 % (ref 0–1)
Eosinophils Absolute: 0.2 K/uL (ref 0.0–0.7)
Eosinophils Relative: 2 % (ref 0–5)
HCT: 41.3 % (ref 36.0–46.0)
Hemoglobin: 13.5 g/dL (ref 12.0–15.0)
Lymphocytes Relative: 29 % (ref 12–46)
Lymphs Abs: 2.7 K/uL (ref 0.7–4.0)
MCH: 27.9 pg (ref 26.0–34.0)
MCHC: 32.7 g/dL (ref 30.0–36.0)
MCV: 85.3 fL (ref 78.0–100.0)
Monocytes Absolute: 0.8 K/uL (ref 0.1–1.0)
Monocytes Relative: 9 % (ref 3–12)
Neutro Abs: 5.6 K/uL (ref 1.7–7.7)
Neutrophils Relative %: 59 % (ref 43–77)
Platelets: 269 K/uL (ref 150–400)
RBC: 4.84 MIL/uL (ref 3.87–5.11)
RDW: 13.6 % (ref 11.5–15.5)
WBC: 9.4 K/uL (ref 4.0–10.5)

## 2014-04-21 LAB — URINALYSIS, ROUTINE W REFLEX MICROSCOPIC
BILIRUBIN URINE: NEGATIVE
Glucose, UA: NEGATIVE mg/dL
Hgb urine dipstick: NEGATIVE
Ketones, ur: NEGATIVE mg/dL
Leukocytes, UA: NEGATIVE
Nitrite: NEGATIVE
PH: 7.5 (ref 5.0–8.0)
Protein, ur: NEGATIVE mg/dL
Specific Gravity, Urine: 1.008 (ref 1.005–1.030)
Urobilinogen, UA: 0.2 mg/dL (ref 0.0–1.0)

## 2014-04-21 LAB — BASIC METABOLIC PANEL
Anion gap: 11 (ref 5–15)
BUN: 10 mg/dL (ref 6–23)
CALCIUM: 8.9 mg/dL (ref 8.4–10.5)
CO2: 20 mEq/L (ref 19–32)
Chloride: 109 mEq/L (ref 96–112)
Creatinine, Ser: 1.12 mg/dL — ABNORMAL HIGH (ref 0.50–1.10)
GFR calc Af Amer: 68 mL/min — ABNORMAL LOW (ref >90)
GFR calc non Af Amer: 59 mL/min — ABNORMAL LOW (ref >90)
GLUCOSE: 100 mg/dL — AB (ref 70–99)
Potassium: 4 mEq/L (ref 3.7–5.3)
SODIUM: 140 meq/L (ref 137–147)

## 2014-04-21 LAB — PREGNANCY, URINE: Preg Test, Ur: NEGATIVE

## 2014-04-21 LAB — RAPID URINE DRUG SCREEN, HOSP PERFORMED
Amphetamines: NOT DETECTED
BARBITURATES: NOT DETECTED
Benzodiazepines: NOT DETECTED
Cocaine: NOT DETECTED
OPIATES: NOT DETECTED
TETRAHYDROCANNABINOL: NOT DETECTED

## 2014-04-21 NOTE — ED Provider Notes (Signed)
CSN: [id number]     Arrival date & time 04/21/14  1510 History   First MD Initiated Contact with Patient 04/21/14 1541     Chief Complaint  Patient presents with  . Loss of Vision     (Consider location/radiation/quality/duration/timing/severity/associated sxs/prior Treatment) Patient is a 43 y.o. female presenting with eye problem. The history is provided by the patient. No language interpreter was used.  Eye Problem Location:  Both Quality: sudden onset visual loss. Severity:  Severe Onset quality:  Sudden Duration: 10--15 seconds. Timing:  Intermittent Progression:  Resolved (4 episodes since last night) Chronicity:  New Relieved by:  Nothing Worsened by:  Nothing tried Ineffective treatments:  None tried Associated symptoms: no decreased vision, no discharge, no double vision, no headaches, no inflammation, no itching, no nausea, no numbness, no photophobia, no scotomas, no swelling, no vomiting and no weakness   Risk factors: no conjunctival hemorrhage, not exposed to pinkeye, no previous injury to eye, no recent herpes zoster and no recent URI   Risk factors comment:  History of bilateral cataract removal   Past Medical History  Diagnosis Date  . Bipolar disorder   . Fibromyalgia     on disability  . Migraine   . Panic attack   . Muscle spasm   . Uterine perforation     hx of  . Syncope     recurrent  . POTS (postural orthostatic tachycardia syndrome)   . Pain of right side of body     chronic  . Arthritis    Past Surgical History  Procedure Laterality Date  . Dilation and curettage of uterus    . Cataract extraction  dec 2014  . Lasik  dec 2014   Family History  Problem Relation Age of Onset  . Diabetes Mother   . Hypertension Mother   . Cancer Father     brain tumor  . Cancer Maternal Grandmother   . Cancer Maternal Grandfather   . Cancer Paternal Grandmother   . Cancer Paternal Grandfather    History  Substance Use Topics  . Smoking status:  Never Smoker   . Smokeless tobacco: Never Used  . Alcohol Use: No     Comment: 1 holiday drink yearly   OB History    No data available     Review of Systems  Constitutional: Negative for fever, chills, diaphoresis, activity change, appetite change and fatigue.  HENT: Negative for congestion, facial swelling, rhinorrhea and sore throat.   Eyes: Negative for double vision, photophobia, discharge and itching.  Respiratory: Negative for cough, chest tightness and shortness of breath.   Cardiovascular: Negative for chest pain, palpitations and leg swelling.  Gastrointestinal: Negative for nausea, vomiting, abdominal pain and diarrhea.  Endocrine: Negative for polydipsia and polyuria.  Genitourinary: Negative for dysuria, frequency, difficulty urinating and pelvic pain.  Musculoskeletal: Negative for back pain, arthralgias, neck pain and neck stiffness.  Skin: Negative for color change and wound.  Allergic/Immunologic: Negative for immunocompromised state.  Neurological: Negative for facial asymmetry, weakness, numbness and headaches.  Hematological: Does not bruise/bleed easily.  Psychiatric/Behavioral: Negative for confusion and agitation.      Allergies  Depakote; Seroquel; Azithromycin; Penicillins; Pregabalin; and Sulfa antibiotics  Home Medications   Prior to Admission medications   Medication Sig Start Date End Date Taking? Authorizing Provider  acetaminophen (TYLENOL) 500 MG tablet Take 500-1,000 mg by mouth every 6 (six) hours as needed for mild pain.   Yes Historical Provider, MD  Cholecalciferol (VITAMIN D3) 1000  UNITS CAPS Take 3,000 Units by mouth daily.    Yes Historical Provider, MD  clonazePAM (KLONOPIN) 1 MG tablet Take 1 mg by mouth 3 (three) times daily as needed for anxiety.   Yes Historical Provider, MD  fexofenadine (ALLEGRA) 180 MG tablet Take 180 mg by mouth daily.   Yes Historical Provider, MD  gabapentin (NEURONTIN) 600 MG tablet Take 1 tablet (600 mg  total) by mouth 3 (three) times daily. 02/12/14  Yes Hulen Luster, DO  lithium carbonate (LITHOBID) 300 MG CR tablet Take 300 mg by mouth 2 (two) times daily.   Yes Historical Provider, MD  medroxyPROGESTERone (DEPO-PROVERA) 150 MG/ML injection Inject 150 mg into the muscle every 3 (three) months.   Yes Historical Provider, MD  oxymorphone (OPANA) 10 MG tablet Take 10 mg by mouth every 8 (eight) hours as needed for pain.    Yes Historical Provider, MD  promethazine (PHENERGAN) 25 MG tablet Take 1 tablet (25 mg total) by mouth every 8 (eight) hours as needed for nausea or vomiting. 09/25/13  Yes Resa Miner Lawyer, PA-C  ranitidine (ZANTAC) 150 MG tablet Take 150 mg by mouth daily.   Yes Historical Provider, MD  SUMAtriptan (IMITREX) 100 MG tablet Take 1 tablet (100 mg total) by mouth every 2 (two) hours as needed for migraine or headache. May repeat in 2 hours if headache persists or recurs. 02/07/14  Yes Jearld Fenton, NP  tiZANidine (ZANAFLEX) 4 MG tablet Take 1 tablet (4 mg total) by mouth every 8 (eight) hours as needed for muscle spasms. Patient taking differently: Take 4 mg by mouth at bedtime.  02/20/14  Yes Lucille Passy, MD  topiramate (TOPAMAX) 50 MG tablet Take 2 tablets (100 mg total) by mouth daily. 02/20/14  Yes Lucille Passy, MD  vitamin B-12 (CYANOCOBALAMIN) 1000 MCG tablet Take 1,000 mcg by mouth daily.   Yes Historical Provider, MD   BP 108/71 mmHg  Pulse 95  Temp(Src) 98.6 F (37 C) (Oral)  Resp 18  SpO2 100% Physical Exam  Constitutional: She is oriented to person, place, and time. She appears well-developed and well-nourished. No distress.  HENT:  Head: Normocephalic and atraumatic.  Mouth/Throat: No oropharyngeal exudate.  Eyes: Conjunctivae, EOM and lids are normal. Pupils are equal, round, and reactive to light. Right eye exhibits no chemosis and no discharge. Left eye exhibits no chemosis and no discharge. No scleral icterus.  Fundoscopic exam:      The right eye  shows no hemorrhage and no papilledema. The right eye shows red reflex.       The left eye shows no hemorrhage and no papilledema. The left eye shows red reflex.  Neck: Normal range of motion. Neck supple.  Cardiovascular: Normal rate, regular rhythm and normal heart sounds.  Exam reveals no gallop and no friction rub.   No murmur heard. Pulmonary/Chest: Effort normal and breath sounds normal. No respiratory distress. She has no wheezes. She has no rales.  Abdominal: Soft. Bowel sounds are normal. She exhibits no distension and no mass. There is no tenderness. There is no rebound and no guarding.  Musculoskeletal: Normal range of motion. She exhibits no edema or tenderness.  Neurological: She is alert and oriented to person, place, and time. She has normal strength. She displays no tremor. No cranial nerve deficit or sensory deficit. She exhibits normal muscle tone. She displays a negative Romberg sign. Coordination and gait normal. GCS eye subscore is 4. GCS verbal subscore is 5. GCS motor  subscore is 6.  Skin: Skin is warm and dry.  Psychiatric: She has a normal mood and affect.    ED Course  Procedures (including critical care time) Labs Review Labs Reviewed  URINE CULTURE  CBC WITH DIFFERENTIAL  BASIC METABOLIC PANEL  URINE RAPID DRUG SCREEN (HOSP PERFORMED)  URINALYSIS, ROUTINE W REFLEX MICROSCOPIC  PREGNANCY, URINE    Imaging Review No results found.   EKG Interpretation None      MDM   Final diagnoses:  Acute loss of vision, bilateral    Pt is a 43 y.o. female with Pmhx as above who presents with 4 episodes of sudden onset BL vision loss lasting 10-15 secs w/o assoc symptoms. 2 episodes while sitting, tow while standing. She has hx of POTS and frequent prior syncopal episodes and states that this was not likea syncopal or near syncopal episode. She has no complaints currently other than being somewhat more fatigued than usual over the past several weeks. Vital signs are  stable she is in no acute distress. Neuro exam is benign. HEENT exam is benign. Spoke w/ neurology will get MRI.   MRI nml, I do not feel pt requires further w/u. Symptoms not c/w CVA, TIA given nml MRI. Also not c/w glaucoma, retinal detachment. Will recommend inc fluid intake, outpt PCP f/u.         Ernestina Patches, MD 04/22/14 1901

## 2014-04-21 NOTE — Discharge Instructions (Signed)
Visual Disturbances You have had a disturbance in your vision. This may be caused by various conditions, such as:  Migraines. Migraine headaches are often preceded by a disturbance in vision. Blind spots or light flashes are followed by a headache. This type of visual disturbance is temporary. It does not damage the eye.  Glaucoma. This is caused by increased pressure in the eye. Symptoms include haziness, blurred vision, or seeing rainbow colored circles when looking at bright lights. Partial or complete visual loss can occur. You may or may not experience eye pain. Visual loss may be gradual or sudden and is irreversible. Glaucoma is the leading cause of blindness.  Retina problems. Vision will be reduced if the retina becomes detached or if there is a circulation problem as with diabetes, high blood pressure, or a mini-stroke. Symptoms include seeing "floaters," flashes of light, or shadows, as if a curtain has fallen over your eye.  Optic nerve problems. The main nerve in your eye can be damaged by redness, soreness, and swelling (inflammation), poor circulation, drugs, and toxins. It is very important to have a complete exam done by a specialist to determine the exact cause of your eye problem. The specialist may recommend medicines or surgery, depending on the cause of the problem. This can help prevent further loss of vision or reduce the risk of having a stroke. Contact the caregiver to whom you have been referred and arrange for follow-up care right away. SEEK IMMEDIATE MEDICAL CARE IF:   Your vision gets worse.  You develop severe headaches.  You have any weakness or numbness in the face, arms, or legs.  You have any trouble speaking or walking. Document Released: 07/15/2004 Document Revised: 08/30/2011 Document Reviewed: 11/05/2009 [facility] Patient Information 2015 [place], [place]. This information is not intended to replace advice given to you by your health care provider. Make sure  you discuss any questions you have with your health care provider.  Stress and Stress Management Stress is a normal reaction to life events. It is what you feel when life demands more than you are used to or more than you can handle. Some stress can be useful. For example, the stress reaction can help you catch the last bus of the day, study for a test, or meet a deadline at work. But stress that occurs too often or for too long can cause problems. It can affect your emotional health and interfere with relationships and normal daily activities. Too much stress can weaken your immune system and increase your risk for physical illness. If you already have a medical problem, stress can make it worse. CAUSES  All sorts of life events may cause stress. An event that causes stress for one person may not be stressful for another person. Major life events commonly cause stress. These may be positive or negative. Examples include losing your job, moving into a new home, getting married, having a baby, or losing a loved one. Less obvious life events may also cause stress, especially if they occur day after day or in combination. Examples include working long hours, driving in traffic, caring for children, being in debt, or being in a difficult relationship. SIGNS AND SYMPTOMS Stress may cause emotional symptoms including, the following:  Anxiety. This is feeling worried, afraid, on edge, overwhelmed, or out of control.  Anger. This is feeling irritated or impatient.  Depression. This is feeling sad, down, helpless, or guilty.  Difficulty focusing, remembering, or making decisions. Stress may cause physical symptoms, including  the following:   Aches and pains. These may affect your head, neck, back, stomach, or other areas of your body.  Tight muscles or clenched jaw.  Low energy or trouble sleeping. Stress may cause unhealthy behaviors, including the following:   Eating to feel better (overeating) or  skipping meals.  Sleeping too little, too much, or both.  Working too much or putting off tasks (procrastination).  Smoking, drinking alcohol, or using drugs to feel better. DIAGNOSIS  Stress is diagnosed through an assessment by your health care provider. Your health care provider will ask questions about your symptoms and any stressful life events.Your health care provider will also ask about your medical history and may order blood tests or other tests. Certain medical conditions and medicine can cause physical symptoms similar to stress. Mental illness can cause emotional symptoms and unhealthy behaviors similar to stress. Your health care provider may refer you to a mental health professional for further evaluation.  TREATMENT  Stress management is the recommended treatment for stress.The goals of stress management are reducing stressful life events and coping with stress in healthy ways.  Techniques for reducing stressful life events include the following:  Stress identification. Self-monitor for stress and identify what causes stress for you. These skills may help you to avoid some stressful events.  Time management. Set your priorities, keep a calendar of events, and learn to say "no." These tools can help you avoid making too many commitments. Techniques for coping with stress include the following:  Rethinking the problem. Try to think realistically about stressful events rather than ignoring them or overreacting. Try to find the positives in a stressful situation rather than focusing on the negatives.  Exercise. Physical exercise can release both physical and emotional tension. The key is to find a form of exercise you enjoy and do it regularly.  Relaxation techniques. These relax the body and mind. Examples include yoga, meditation, tai chi, biofeedback, deep breathing, progressive muscle relaxation, listening to music, being out in nature, journaling, and other hobbies. Again,  the key is to find one or more that you enjoy and can do regularly.  Healthy lifestyle. Eat a balanced diet, get plenty of sleep, and do not smoke. Avoid using alcohol or drugs to relax.  Strong support network. Spend time with family, friends, or other people you enjoy being around.Express your feelings and talk things over with someone you trust. Counseling or talktherapy with a mental health professional may be helpful if you are having difficulty managing stress on your own. Medicine is typically not recommended for the treatment of stress.Talk to your health care provider if you think you need medicine for symptoms of stress. HOME CARE INSTRUCTIONS  Keep all follow-up visits as directed by your health care provider.  Take all medicines as directed by your health care provider. SEEK MEDICAL CARE IF:  Your symptoms get worse or you start having new symptoms.  You feel overwhelmed by your problems and can no longer manage them on your own. SEEK IMMEDIATE MEDICAL CARE IF:  You feel like hurting yourself or someone else. Document Released: 12/01/2000 Document Revised: 10/22/2013 Document Reviewed: 01/30/2013 [facility] Patient Information 2015 [place], [place]. This information is not intended to replace advice given to you by your health care provider. Make sure you discuss any questions you have with your health care provider.

## 2014-04-21 NOTE — ED Notes (Signed)
Pt states that beginning last night, she started having intermittent complete loss of vision in bilateral eyes.  Pt states that these episodes last about 15 seconds and then she is back to normal.  No pain associated.  Pt states that last episode was about 1330 today.  No symptoms right now.  PERRLA.

## 2014-04-23 ENCOUNTER — Other Ambulatory Visit: Payer: Self-pay | Admitting: Family Medicine

## 2014-04-23 LAB — URINE CULTURE
COLONY COUNT: NO GROWTH
Culture: NO GROWTH

## 2014-04-23 NOTE — Telephone Encounter (Signed)
Pt requesting medication refill. Last f/u appt 02/2014. Ok to fill per Dr Deborra Medina. Rx to be faxed to requested pharmacy before end of day, 04/24/14

## 2014-05-20 ENCOUNTER — Encounter: Payer: Self-pay | Admitting: Family Medicine

## 2014-05-20 ENCOUNTER — Ambulatory Visit (INDEPENDENT_AMBULATORY_CARE_PROVIDER_SITE_OTHER): Payer: Medicare Other | Admitting: Family Medicine

## 2014-05-20 VITALS — BP 128/76 | HR 90 | Temp 98.1°F | Wt 168.2 lb

## 2014-05-20 DIAGNOSIS — G894 Chronic pain syndrome: Secondary | ICD-10-CM | POA: Insufficient documentation

## 2014-05-20 DIAGNOSIS — M797 Fibromyalgia: Secondary | ICD-10-CM

## 2014-05-20 MED ORDER — VENLAFAXINE HCL ER 75 MG PO CP24: 75.0000 mg | ORAL_CAPSULE | Freq: Two times a day (BID) | ORAL | Status: DC

## 2014-05-20 NOTE — Progress Notes (Signed)
Subjective:   Patient ID: Melissa [name], female    DOB: [birth year], 43 y.o.   MRN: [id number]  Melissa [name] is a pleasant 43 y.o. year old female who presents to clinic today with Referral  on 32/35/5732  HPI: Very complicated medical history, including POTS, fibromyalgia, bipolar disorder here for referral to new pain management clinic. Has been very pleased with Opana- controlling her pain well but her new insurance company will no longer cover that clinic.  No longer going to [facility] to see Fibromyalgia specialist since Lidocaine infusions were not helping.  She would like for me to take over prescribing her Effexor.   Current Outpatient Prescriptions on File Prior to Visit  Medication Sig Dispense Refill  . Cholecalciferol (VITAMIN D3) 1000 UNITS CAPS Take 3,000 Units by mouth daily.     . clonazePAM (KLONOPIN) 1 MG tablet TAKE ONE TABLET TWICE DAILY FOR ANXIETY 60 tablet 0  . fexofenadine (ALLEGRA) 180 MG tablet Take 180 mg by mouth daily.    Marland Kitchen gabapentin (NEURONTIN) 600 MG tablet Take 1 tablet (600 mg total) by mouth 3 (three) times daily. 90 tablet 6  . lithium carbonate (LITHOBID) 300 MG CR tablet Take 300 mg by mouth 3 (three) times daily.     . medroxyPROGESTERone (DEPO-PROVERA) 150 MG/ML injection Inject 150 mg into the muscle every 3 (three) months.    Marland Kitchen oxymorphone (OPANA) 10 MG tablet Take 10 mg by mouth every 8 (eight) hours as needed for pain.     . promethazine (PHENERGAN) 25 MG tablet Take 1 tablet (25 mg total) by mouth every 8 (eight) hours as needed for nausea or vomiting. 15 tablet 0  . ranitidine (ZANTAC) 150 MG tablet Take 150 mg by mouth daily.    . SUMAtriptan (IMITREX) 100 MG tablet Take 1 tablet (100 mg total) by mouth every 2 (two) hours as needed for migraine or headache. May repeat in 2 hours if headache persists or recurs. 10 tablet 0  . tiZANidine (ZANAFLEX) 4 MG tablet TAKE ONE TABLET EVERY EIGHT (8) HOURS ASNEEDED FOR MUSCLE SPASMS 30 tablet 1  .  topiramate (TOPAMAX) 50 MG tablet Take 2 tablets (100 mg total) by mouth daily. 60 tablet 3  . vitamin B-12 (CYANOCOBALAMIN) 1000 MCG tablet Take 1,000 mcg by mouth daily.     No current facility-administered medications on file prior to visit.    Allergies  Allergen Reactions  . Depakote [Divalproex Sodium] Other (See Comments)    Hair loss  . Seroquel [Quetiapine Fumarate] Other (See Comments)    irritable  . Azithromycin Rash  . Penicillins Rash  . Pregabalin Palpitations    Manic symptoms, no sleep x's 3 days, no appetite.  . Sulfa Antibiotics Rash    Past Medical History  Diagnosis Date  . Bipolar disorder   . Fibromyalgia     on disability  . Migraine   . Panic attack   . Muscle spasm   . Uterine perforation     hx of  . Syncope     recurrent  . POTS (postural orthostatic tachycardia syndrome)   . Pain of right side of body     chronic  . Arthritis     Past Surgical History  Procedure Laterality Date  . Dilation and curettage of uterus    . Cataract extraction  dec 2014  . Lasik  dec 2014    Family History  Problem Relation Age of Onset  . Diabetes Mother   .  Hypertension Mother   . Cancer Father     brain tumor  . Cancer Maternal Grandmother   . Cancer Maternal Grandfather   . Cancer Paternal Grandmother   . Cancer Paternal Grandfather     History   Social History  . Marital Status: Married    Spouse Name: Melissa [name]    Number of Children: 0  . Years of Education: HS/college   Occupational History  .  Other    disabled   Social History Main Topics  . Smoking status: Never Smoker   . Smokeless tobacco: Never Used  . Alcohol Use: No     Comment: 1 holiday drink yearly  . Drug Use: No  . Sexual Activity: Not on file   Other Topics Concern  . Not on file   Social History Narrative   Patient is married Melissa [name]) and lives at home with her husband.   Married 20+ years   From [place]   On disability for fibromyalgia   Caffeine Use: 1 soda  daily   The PMH, PSH, Social History, Family History, Medications, and allergies have been reviewed in [facility], and have been updated if relevant.   Review of Systems    See HPI No HA No CP No SOB  Objective:    BP 128/76 mmHg  Pulse 90  Temp(Src) 98.1 F (36.7 C) (Oral)  Wt 168 lb 4 oz (76.318 kg)  SpO2 98%   Physical Exam  Constitutional: She is oriented to person, place, and time. She appears well-developed and well-nourished. No distress.  HENT:  Head: Normocephalic and atraumatic.  Neurological: She is alert and oriented to person, place, and time.  Skin: Skin is warm and dry.  Psychiatric: She has a normal mood and affect. Her behavior is normal. Judgment and thought content normal.  Nursing note and vitals reviewed.         Assessment & Plan:   Fibromyalgia  Chronic pain syndrome No Follow-up on file.

## 2014-05-20 NOTE — Progress Notes (Signed)
Pre visit review using our clinic review tool, if applicable. No additional management support is needed unless otherwise documented below in the visit note. 

## 2014-05-20 NOTE — Assessment & Plan Note (Signed)
>  25 minutes spent in face to face time with patient, >50% spent in counselling or coordination of care Refer to pain clinic- she is getting relief from Turner. She is also considering epidural injections and asked my opinion about this.

## 2014-05-20 NOTE — Assessment & Plan Note (Signed)
I agreed to refill her Effexor. She does feel this is helping with both her mood and Fibro pain.

## 2014-06-04 ENCOUNTER — Telehealth: Payer: Self-pay | Admitting: Family Medicine

## 2014-06-04 NOTE — Telephone Encounter (Signed)
Butch Penny calling [facility] in [facility], they are saying no to the evaluation because drug test was negative for opiates.

## 2014-06-10 ENCOUNTER — Ambulatory Visit (INDEPENDENT_AMBULATORY_CARE_PROVIDER_SITE_OTHER): Payer: Medicare Other | Admitting: Family Medicine

## 2014-06-10 ENCOUNTER — Encounter: Payer: Self-pay | Admitting: Family Medicine

## 2014-06-10 VITALS — BP 106/62 | HR 85 | Temp 98.1°F | Wt 166.5 lb

## 2014-06-10 DIAGNOSIS — R232 Flushing: Secondary | ICD-10-CM | POA: Insufficient documentation

## 2014-06-10 DIAGNOSIS — N951 Menopausal and female climacteric states: Secondary | ICD-10-CM

## 2014-06-10 DIAGNOSIS — Z3009 Encounter for other general counseling and advice on contraception: Secondary | ICD-10-CM

## 2014-06-10 MED ORDER — MEDROXYPROGESTERONE ACETATE 150 MG/ML IM SUSP
150.0000 mg | Freq: Once | INTRAMUSCULAR | Status: AC
  Administered 2014-06-10: 150 mg via INTRAMUSCULAR

## 2014-06-10 NOTE — Progress Notes (Signed)
Pre visit review using our clinic review tool, if applicable. No additional management support is needed unless otherwise documented below in the visit note. 

## 2014-06-10 NOTE — Assessment & Plan Note (Addendum)
Im depo given today. Did discuss with Melissa [name] that she could possibly be perimenopausal, would be very difficult to assess while she is taking contraceptive. Advised to discuss further with GYN. The patient indicates understanding of these issues and agrees with the plan.  >15 minutes spent in face to face time with patient, >50% spent in counselling or coordination of care

## 2014-06-10 NOTE — Progress Notes (Signed)
Subjective:   Patient ID: Melissa [name], female    DOB: [birth year], 43 y.o.   MRN: [id number]  Melissa [name] is a pleasant 43 y.o. year old female who presents to clinic today with Follow-up  on 85/07/7739  HPI: Very complicated medical history, including POTS, fibromyalgia, bipolar disorder here for her q 3 months depo provera injection.  Also having questions about hot flashes she is having lately.  Has appt with her GYN in a few months.  Has always had issues with insomnia- this is no worse. Denies vaginal dryness or worsening mood swings.  No breast tenderness.  No recent ER visits for POTs.   Current Outpatient Prescriptions on File Prior to Visit  Medication Sig Dispense Refill  . Cholecalciferol (VITAMIN D3) 1000 UNITS CAPS Take 3,000 Units by mouth daily.     . clonazePAM (KLONOPIN) 1 MG tablet TAKE ONE TABLET TWICE DAILY FOR ANXIETY 60 tablet 0  . fexofenadine (ALLEGRA) 180 MG tablet Take 180 mg by mouth daily.    Marland Kitchen gabapentin (NEURONTIN) 600 MG tablet Take 1 tablet (600 mg total) by mouth 3 (three) times daily. 90 tablet 6  . lithium carbonate (LITHOBID) 300 MG CR tablet Take 300 mg by mouth 3 (three) times daily.     . medroxyPROGESTERone (DEPO-PROVERA) 150 MG/ML injection Inject 150 mg into the muscle every 3 (three) months.    Marland Kitchen oxymorphone (OPANA) 10 MG tablet Take 10 mg by mouth every 8 (eight) hours as needed for pain.     . promethazine (PHENERGAN) 25 MG tablet Take 1 tablet (25 mg total) by mouth every 8 (eight) hours as needed for nausea or vomiting. 15 tablet 0  . ranitidine (ZANTAC) 150 MG tablet Take 150 mg by mouth daily.    . SUMAtriptan (IMITREX) 100 MG tablet Take 1 tablet (100 mg total) by mouth every 2 (two) hours as needed for migraine or headache. May repeat in 2 hours if headache persists or recurs. 10 tablet 0  . tiZANidine (ZANAFLEX) 4 MG tablet TAKE ONE TABLET EVERY EIGHT (8) HOURS ASNEEDED FOR MUSCLE SPASMS 30 tablet 1  . topiramate (TOPAMAX) 50 MG  tablet Take 2 tablets (100 mg total) by mouth daily. 60 tablet 3  . venlafaxine XR (EFFEXOR-XR) 75 MG 24 hr capsule Take 1 capsule (75 mg total) by mouth 2 (two) times daily. 60 capsule 3  . vitamin B-12 (CYANOCOBALAMIN) 1000 MCG tablet Take 1,000 mcg by mouth daily.     No current facility-administered medications on file prior to visit.    Allergies  Allergen Reactions  . Depakote [Divalproex Sodium] Other (See Comments)    Hair loss  . Seroquel [Quetiapine Fumarate] Other (See Comments)    irritable  . Azithromycin Rash  . Penicillins Rash  . Pregabalin Palpitations    Manic symptoms, no sleep x's 3 days, no appetite.  . Sulfa Antibiotics Rash    Past Medical History  Diagnosis Date  . Bipolar disorder   . Fibromyalgia     on disability  . Migraine   . Panic attack   . Muscle spasm   . Uterine perforation     hx of  . Syncope     recurrent  . POTS (postural orthostatic tachycardia syndrome)   . Pain of right side of body     chronic  . Arthritis     Past Surgical History  Procedure Laterality Date  . Dilation and curettage of uterus    . Cataract extraction  dec 2014  . Lasik  dec 2014    Family History  Problem Relation Age of Onset  . Diabetes Mother   . Hypertension Mother   . Cancer Father     brain tumor  . Cancer Maternal Grandmother   . Cancer Maternal Grandfather   . Cancer Paternal Grandmother   . Cancer Paternal Grandfather     History   Social History  . Marital Status: Married    Spouse Name: Izell [place]    Number of Children: 0  . Years of Education: HS/college   Occupational History  .  Other    disabled   Social History Main Topics  . Smoking status: Never Smoker   . Smokeless tobacco: Never Used  . Alcohol Use: No     Comment: 1 holiday drink yearly  . Drug Use: No  . Sexual Activity: Not on file   Other Topics Concern  . Not on file   Social History Narrative   Patient is married Izell ) and lives at home with her  husband.   Married 20+ years   From [place]   On disability for fibromyalgia   Caffeine Use: 1 soda daily   The PMH, PSH, Social History, Family History, Medications, and allergies have been reviewed in [facility], and have been updated if relevant.   Review of Systems  Constitutional: Positive for diaphoresis and fatigue. Negative for fever and appetite change.  Cardiovascular: Negative.   Gastrointestinal: Negative for abdominal pain.  Psychiatric/Behavioral: Negative for suicidal ideas, sleep disturbance and self-injury. The patient is not nervous/anxious.    See HPI  No CP or SOB   Abdominal pain has resolved +fatigue Denies feeling depressed or anxious No blurred vision  Objective:    BP 106/62 mmHg  Pulse 85  Temp(Src) 98.1 F (36.7 C) (Oral)  Wt 166 lb 8 oz (75.524 kg)  SpO2 98%   Physical Exam  Constitutional: She is oriented to person, place, and time. She appears well-developed and well-nourished. No distress.  HENT:  Head: Normocephalic and atraumatic.  Cardiovascular: Normal rate.   Pulmonary/Chest: Effort normal and breath sounds normal.  Musculoskeletal: Normal range of motion.  Neurological: She is alert and oriented to person, place, and time.  Skin: Skin is dry.  Psychiatric: She has a normal mood and affect. Her behavior is normal. Judgment and thought content normal.          Assessment & Plan:   Encounter for other general counseling or advice on contraception - Plan: medroxyPROGESTERone (DEPO-PROVERA) injection 150 mg No Follow-up on file.

## 2014-06-17 ENCOUNTER — Emergency Department: Payer: Self-pay | Admitting: Emergency Medicine

## 2014-06-17 LAB — CBC
HCT: 46.4 % (ref 35.0–47.0)
HGB: 14.9 g/dL (ref 12.0–16.0)
MCH: 27.4 pg (ref 26.0–34.0)
MCHC: 32.1 g/dL (ref 32.0–36.0)
MCV: 85 fL (ref 80–100)
PLATELETS: 290 10*3/uL (ref 150–440)
RBC: 5.43 10*6/uL — AB (ref 3.80–5.20)
RDW: 14.7 % — ABNORMAL HIGH (ref 11.5–14.5)
WBC: 10 10*3/uL (ref 3.6–11.0)

## 2014-06-17 LAB — COMPREHENSIVE METABOLIC PANEL
ALT: 16 U/L
AST: 11 U/L — AB (ref 15–37)
Albumin: 4 g/dL (ref 3.4–5.0)
Alkaline Phosphatase: 74 U/L
Anion Gap: 9 (ref 7–16)
BUN: 11 mg/dL (ref 7–18)
Bilirubin,Total: 0.2 mg/dL (ref 0.2–1.0)
CALCIUM: 8.4 mg/dL — AB (ref 8.5–10.1)
CHLORIDE: 109 mmol/L — AB (ref 98–107)
Co2: 23 mmol/L (ref 21–32)
Creatinine: 1.16 mg/dL (ref 0.60–1.30)
EGFR (African American): >60
EGFR (Non-African Amer.): 54 — ABNORMAL LOW
Glucose: 88 mg/dL (ref 65–99)
Osmolality: 280 (ref 275–301)
Potassium: 4 mmol/L (ref 3.5–5.1)
SODIUM: 141 mmol/L (ref 136–145)
TOTAL PROTEIN: 7.5 g/dL (ref 6.4–8.2)

## 2014-06-18 ENCOUNTER — Telehealth: Payer: Self-pay | Admitting: Family Medicine

## 2014-06-18 ENCOUNTER — Ambulatory Visit: Payer: Medicare Other | Admitting: Internal Medicine

## 2014-06-18 NOTE — Telephone Encounter (Signed)
FYI  Pt spouse came in today for labs and canceled  ms Minnie appointment.  Her appointments was for trouble breathing/sent to [facility] pt

## 2014-06-18 NOTE — Telephone Encounter (Signed)
noted 

## 2014-06-24 ENCOUNTER — Other Ambulatory Visit: Payer: Self-pay | Admitting: Family Medicine

## 2014-06-24 NOTE — Telephone Encounter (Signed)
Rx called in to requested pharmacy 

## 2014-06-24 NOTE — Telephone Encounter (Signed)
Pt requesting medication refill. Last f/u appt 05/2014. pls advise

## 2014-07-31 ENCOUNTER — Other Ambulatory Visit: Payer: Self-pay | Admitting: Family Medicine

## 2014-08-31 ENCOUNTER — Other Ambulatory Visit: Payer: Self-pay | Admitting: Family Medicine

## 2014-09-09 ENCOUNTER — Encounter: Payer: Self-pay | Admitting: Family Medicine

## 2014-09-09 ENCOUNTER — Ambulatory Visit (INDEPENDENT_AMBULATORY_CARE_PROVIDER_SITE_OTHER): Payer: PPO | Admitting: Family Medicine

## 2014-09-09 VITALS — BP 112/70 | HR 86 | Temp 98.3°F | Wt 172.0 lb

## 2014-09-09 DIAGNOSIS — R232 Flushing: Secondary | ICD-10-CM

## 2014-09-09 DIAGNOSIS — E559 Vitamin D deficiency, unspecified: Secondary | ICD-10-CM | POA: Insufficient documentation

## 2014-09-09 DIAGNOSIS — K769 Liver disease, unspecified: Secondary | ICD-10-CM | POA: Insufficient documentation

## 2014-09-09 DIAGNOSIS — K7689 Other specified diseases of liver: Secondary | ICD-10-CM

## 2014-09-09 DIAGNOSIS — N951 Menopausal and female climacteric states: Secondary | ICD-10-CM

## 2014-09-09 DIAGNOSIS — Z3042 Encounter for surveillance of injectable contraceptive: Secondary | ICD-10-CM

## 2014-09-09 DIAGNOSIS — E538 Deficiency of other specified B group vitamins: Secondary | ICD-10-CM

## 2014-09-09 LAB — COMPREHENSIVE METABOLIC PANEL
ALT: 16 U/L (ref 0–35)
AST: 24 U/L (ref 0–37)
Albumin: 4.1 g/dL (ref 3.5–5.2)
Alkaline Phosphatase: 56 U/L (ref 39–117)
BILIRUBIN TOTAL: 0.4 mg/dL (ref 0.2–1.2)
BUN: 8 mg/dL (ref 6–23)
CO2: 24 mEq/L (ref 19–32)
Calcium: 9.3 mg/dL (ref 8.4–10.5)
Chloride: 107 mEq/L (ref 96–112)
Creatinine, Ser: 1.05 mg/dL (ref 0.40–1.20)
GFR: 60.23 mL/min (ref >60.00)
GLUCOSE: 79 mg/dL (ref 70–99)
POTASSIUM: 4.2 meq/L (ref 3.5–5.1)
SODIUM: 139 meq/L (ref 135–145)
Total Protein: 6.9 g/dL (ref 6.0–8.3)

## 2014-09-09 LAB — VITAMIN D 25 HYDROXY (VIT D DEFICIENCY, FRACTURES): VITD: 35.49 ng/mL (ref 30.00–100.00)

## 2014-09-09 LAB — VITAMIN B12: Vitamin B-12: 777 pg/mL (ref 211–911)

## 2014-09-09 MED ORDER — MEDROXYPROGESTERONE ACETATE 150 MG/ML IM SUSP
150.0000 mg | Freq: Once | INTRAMUSCULAR | Status: AC
  Administered 2014-09-09: 150 mg via INTRAMUSCULAR

## 2014-09-09 MED ORDER — CLONAZEPAM 1 MG PO TABS: ORAL_TABLET | ORAL | Status: DC

## 2014-09-09 NOTE — Progress Notes (Signed)
Subjective:   Patient ID: Melissa [name], female    DOB: Mar 04, 1971, 44 y.o.   MRN: [id number]  Melissa [name] is a pleasant 44 y.o. year old female who presents to clinic today with Follow-up  on 0/01/6760  HPI: Very complicated medical history, including POTS, fibromyalgia, bipolar disorder here for her q 3 months depo provera injection.  Hot flashes are a little better. Stopped Effexor, now on Prozac. Has always had issues with insomnia- this is no worse. Denies vaginal dryness or worsening mood swings- if anything, she and her husband feel these are better.  No recent ER visits for POTs.  Liver lesion on CT- abd CT from 01/24/14- recommended 6 month follow up imaging, preferably MRI. Per pt, liver function "looked good" when checked by psychiatry last month.  CLINICAL DATA: Left lower quadrant pain  EXAM: CT ABDOMEN AND PELVIS WITH CONTRAST  TECHNIQUE: Multidetector CT imaging of the abdomen and pelvis was performed using the standard protocol following bolus administration of intravenous contrast.  CONTRAST: 173mL OMNIPAQUE IOHEXOL 300 MG/ML SOLN  COMPARISON: None  FINDINGS: The lung bases are clear. No pleural effusion noted.  Low-attenuation structure within the posterior right hepatic lobe measures 9 mm. This measures 37 Hounsfield units. No additional liver abnormalities noted. The gallbladder appears normal. No biliary dilatation. Normal appearance of the pancreas. The spleen appears normal. The adrenal glands are both normal. 4 mm cyst is too small to reliably characterize arising from the inferior pole of the right kidney. The left kidney is normal. The urinary bladder appears within normal limits. The uterus and adnexal structures are both unremarkable. Normal caliber of the abdominal aorta. No aneurysm. There is no upper abdominal adenopathy. No pelvic or inguinal adenopathy. The uterus and adnexal structures are both unremarkable.  No free  fluid or fluid collections within the abdomen or pelvis. The stomach appears normal. The small bowel loops have a normal course and caliber without evidence for bowel obstruction. A normal appearing appendix is identified within the left side of pelvis, image 74/series 3. The colon appears within normal limits. No evidence for diverticulitis.  Review of the visualized bony structures is negative.  IMPRESSION: 1. No acute findings within the abdomen or pelvis. No explanation for patient's left lower quadrant pain and vomiting 2. Indeterminate, low to intermediate attenuation structure in the right hepatic lobe measures 9 mm. Followup examination in 6 months is recommended. At this time the study of choice would be a contrast enhanced MRI of the liver.   Current Outpatient Prescriptions on File Prior to Visit  Medication Sig Dispense Refill  . Cholecalciferol (VITAMIN D3) 1000 UNITS CAPS Take 3,000 Units by mouth daily.     . fexofenadine (ALLEGRA) 180 MG tablet Take 180 mg by mouth daily.    Marland Kitchen gabapentin (NEURONTIN) 600 MG tablet Take 1 tablet (600 mg total) by mouth 3 (three) times daily. 90 tablet 6  . lithium carbonate (LITHOBID) 300 MG CR tablet Take 300 mg by mouth 3 (three) times daily.     . medroxyPROGESTERone (DEPO-PROVERA) 150 MG/ML injection Inject 150 mg into the muscle every 3 (three) months.    Marland Kitchen oxymorphone (OPANA) 10 MG tablet Take 10 mg by mouth every 8 (eight) hours as needed for pain.     . promethazine (PHENERGAN) 25 MG tablet Take 1 tablet (25 mg total) by mouth every 8 (eight) hours as needed for nausea or vomiting. 15 tablet 0  . ranitidine (ZANTAC) 150 MG tablet Take 150  mg by mouth daily.    . SUMAtriptan (IMITREX) 100 MG tablet Take 1 tablet (100 mg total) by mouth every 2 (two) hours as needed for migraine or headache. May repeat in 2 hours if headache persists or recurs. 10 tablet 0  . tiZANidine (ZANAFLEX) 4 MG tablet TAKE ONE TABLET EVERY EIGHT (8) HOURS  ASNEEDED FOR MUSCLE SPASMS 30 tablet 5  . vitamin B-12 (CYANOCOBALAMIN) 1000 MCG tablet Take 1,000 mcg by mouth daily.     No current facility-administered medications on file prior to visit.    Allergies  Allergen Reactions  . Depakote [Divalproex Sodium] Other (See Comments)    Hair loss  . Seroquel [Quetiapine Fumarate] Other (See Comments)    irritable  . Azithromycin Rash  . Penicillins Rash  . Pregabalin Palpitations    Manic symptoms, no sleep x's 3 days, no appetite.  . Sulfa Antibiotics Rash    Past Medical History  Diagnosis Date  . Bipolar disorder   . Fibromyalgia     on disability  . Migraine   . Panic attack   . Muscle spasm   . Uterine perforation     hx of  . Syncope     recurrent  . POTS (postural orthostatic tachycardia syndrome)   . Pain of right side of body     chronic  . Arthritis     Past Surgical History  Procedure Laterality Date  . Dilation and curettage of uterus    . Cataract extraction  dec 2014  . Lasik  dec 2014    Family History  Problem Relation Age of Onset  . Diabetes Mother   . Hypertension Mother   . Cancer Father     brain tumor  . Cancer Maternal Grandmother   . Cancer Maternal Grandfather   . Cancer Paternal Grandmother   . Cancer Paternal Grandfather     History   Social History  . Marital Status: Married    Spouse Name: Izell [place]  . Number of Children: 0  . Years of Education: HS/college   Occupational History  .  Other    disabled   Social History Main Topics  . Smoking status: Never Smoker   . Smokeless tobacco: Never Used  . Alcohol Use: No     Comment: 1 holiday drink yearly  . Drug Use: No  . Sexual Activity: Not on file   Other Topics Concern  . Not on file   Social History Narrative   Patient is married Izell [place]) and lives at home with her husband.   Married 20+ years   From [place]   On disability for fibromyalgia   Caffeine Use: 1 soda daily   The PMH, PSH, Social History, Family  History, Medications, and allergies have been reviewed in [facility], and have been updated if relevant.   Review of Systems  Constitutional: Positive for fatigue. Negative for fever, diaphoresis and appetite change.  HENT: Negative.   Cardiovascular: Negative.   Gastrointestinal: Negative for vomiting and abdominal pain.  Endocrine: Negative.   Genitourinary: Negative.   Musculoskeletal: Negative.   Skin: Negative for color change.  Allergic/Immunologic: Negative.   Neurological: Negative.   Hematological: Negative.   Psychiatric/Behavioral: Negative for suicidal ideas, sleep disturbance and self-injury. The patient is not nervous/anxious.     Objective:    BP 112/70 mmHg  Pulse 86  Temp(Src) 98.3 F (36.8 C) (Oral)  Wt 172 lb (78.019 kg)  SpO2 97%   Physical Exam  Constitutional: She is oriented  to person, place, and time. She appears well-developed and well-nourished. No distress.  HENT:  Head: Normocephalic and atraumatic.  Eyes: Conjunctivae are normal. No scleral icterus.  Cardiovascular: Normal rate.   Pulmonary/Chest: Effort normal and breath sounds normal.  Abdominal: Soft. Bowel sounds are normal. She exhibits no distension. There is no tenderness.  Musculoskeletal: Normal range of motion.  Neurological: She is alert and oriented to person, place, and time.  Skin: Skin is warm and dry.  Psychiatric: She has a normal mood and affect. Her behavior is normal. Judgment and thought content normal.          Assessment & Plan:   Liver lesion - Plan: MR Abdomen W Contrast, Comprehensive metabolic panel  Vitamin Y61 deficiency - Plan: Vitamin B12  Encounter for surveillance of injectable contraceptive - Plan: medroxyPROGESTERone (DEPO-PROVERA) injection 150 mg  Vitamin D deficiency - Plan: Vit D  25 hydroxy (rtn osteoporosis monitoring) No Follow-up on file.

## 2014-09-09 NOTE — Patient Instructions (Signed)
We will call you with your lab results. Happy birthday!  I have ordered your MRI- please stop by to see Rosaria Ferries on your way out.

## 2014-09-09 NOTE — Assessment & Plan Note (Signed)
Improving. Continue current rx- no changes made today.

## 2014-09-09 NOTE — Assessment & Plan Note (Signed)
Due for labs today. 

## 2014-09-09 NOTE — Progress Notes (Signed)
Pre visit review using our clinic review tool, if applicable. No additional management support is needed unless otherwise documented below in the visit note. 

## 2014-09-09 NOTE — Assessment & Plan Note (Signed)
Discussed with pt - reviewed images/answered questions. She agrees to follow up imaging- MRI referral placed.

## 2014-09-10 ENCOUNTER — Encounter: Payer: Self-pay | Admitting: Family Medicine

## 2014-09-12 ENCOUNTER — Other Ambulatory Visit: Payer: Self-pay | Admitting: Family Medicine

## 2014-09-12 DIAGNOSIS — K769 Liver disease, unspecified: Secondary | ICD-10-CM

## 2014-09-12 NOTE — Addendum Note (Signed)
Addended by: Modena Nunnery on: 09/12/2014 10:59 AM   Modules accepted: Orders

## 2014-09-24 ENCOUNTER — Ambulatory Visit (HOSPITAL_COMMUNITY)
Admission: RE | Admit: 2014-09-24 | Discharge: 2014-09-24 | Disposition: A | Discharge status: Home / Self Care | Payer: PPO | Source: Ambulatory Visit | Attending: Family Medicine | Admitting: Family Medicine

## 2014-09-24 DIAGNOSIS — D1803 Hemangioma of intra-abdominal structures: Secondary | ICD-10-CM | POA: Diagnosis not present

## 2014-09-24 DIAGNOSIS — K769 Liver disease, unspecified: Secondary | ICD-10-CM | POA: Diagnosis present

## 2014-09-24 MED ORDER — GADOBENATE DIMEGLUMINE 529 MG/ML IV SOLN
16.0000 mL | Freq: Once | INTRAVENOUS | Status: AC | PRN
  Administered 2014-09-24: 16 mL via INTRAVENOUS

## 2014-10-02 ENCOUNTER — Ambulatory Visit (INDEPENDENT_AMBULATORY_CARE_PROVIDER_SITE_OTHER): Payer: PPO | Admitting: Internal Medicine

## 2014-10-02 ENCOUNTER — Encounter: Payer: Self-pay | Admitting: Internal Medicine

## 2014-10-02 VITALS — BP 120/78 | HR 90 | Temp 98.4°F | Wt 172.0 lb

## 2014-10-02 DIAGNOSIS — R209 Unspecified disturbances of skin sensation: Secondary | ICD-10-CM | POA: Diagnosis not present

## 2014-10-02 NOTE — Progress Notes (Signed)
Subjective:    Patient ID: Melissa [name], female    DOB: 1971/04/08, 44 y.o.   MRN: [id number]  HPI  Pt presents to the clinic today with c/o a rash. She first noticed it 3 months ago. It is mainly located on her upper back but she has noticed similar spots on her abdomen. She saw Dr. Deborra Medina for the same, she felt like the rash was acne and advised her to use topical OTC treatment. She is currently using Stridex, Erythromycin gel without relief. She reports the area has a burning sensation. It is constant but seems worse when she lays or her back. The only thing that has helped was laying on an ice pack. She does have fibromyalgia and takes Zanaflex and Gabapentin but reports no relief from those medications. She does have an appt with dermatology but not until May.  Review of Systems      Past Medical History  Diagnosis Date  . Bipolar disorder   . Fibromyalgia     on disability  . Migraine   . Panic attack   . Muscle spasm   . Uterine perforation     hx of  . Syncope     recurrent  . POTS (postural orthostatic tachycardia syndrome)   . Pain of right side of body     chronic  . Arthritis     Current Outpatient Prescriptions  Medication Sig Dispense Refill  . Cholecalciferol (VITAMIN D3) 1000 UNITS CAPS Take 3,000 Units by mouth daily.     . clonazePAM (KLONOPIN) 1 MG tablet TAKE ONE TABLET TWICE DAILY FOR ANXIETY 60 tablet 0  . fexofenadine (ALLEGRA) 180 MG tablet Take 180 mg by mouth daily.    Marland Kitchen gabapentin (NEURONTIN) 600 MG tablet Take 1 tablet (600 mg total) by mouth 3 (three) times daily. 90 tablet 6  . medroxyPROGESTERone (DEPO-PROVERA) 150 MG/ML injection Inject 150 mg into the muscle every 3 (three) months.    Marland Kitchen oxymorphone (OPANA) 10 MG tablet Take 10 mg by mouth every 8 (eight) hours as needed for pain.     . promethazine (PHENERGAN) 25 MG tablet Take 1 tablet (25 mg total) by mouth every 8 (eight) hours as needed for nausea or vomiting. 15 tablet 0  . ranitidine  (ZANTAC) 150 MG tablet Take 150 mg by mouth daily.    . SUMAtriptan (IMITREX) 100 MG tablet Take 1 tablet (100 mg total) by mouth every 2 (two) hours as needed for migraine or headache. May repeat in 2 hours if headache persists or recurs. 10 tablet 0  . tiZANidine (ZANAFLEX) 4 MG tablet TAKE ONE TABLET EVERY EIGHT (8) HOURS ASNEEDED FOR MUSCLE SPASMS 30 tablet 5  . vitamin B-12 (CYANOCOBALAMIN) 1000 MCG tablet Take 1,000 mcg by mouth daily.    Marland Kitchen lithium carbonate (LITHOBID) 300 MG CR tablet Take 300 mg by mouth 3 (three) times daily.      No current facility-administered medications for this visit.    Allergies  Allergen Reactions  . Depakote [Divalproex Sodium] Other (See Comments)    Hair loss  . Seroquel [Quetiapine Fumarate] Other (See Comments)    irritable  . Azithromycin Rash  . Penicillins Rash  . Pregabalin Palpitations    Manic symptoms, no sleep x's 3 days, no appetite.  . Sulfa Antibiotics Rash    Family History  Problem Relation Age of Onset  . Diabetes Mother   . Hypertension Mother   . Cancer Father     brain tumor  .  Cancer Maternal Grandmother   . Cancer Maternal Grandfather   . Cancer Paternal Grandmother   . Cancer Paternal Grandfather     History   Social History  . Marital Status: Married    Spouse Name: Izell [place]  . Number of Children: 0  . Years of Education: HS/college   Occupational History  .  Other    disabled   Social History Main Topics  . Smoking status: Never Smoker   . Smokeless tobacco: Never Used  . Alcohol Use: No     Comment: 1 holiday drink yearly  . Drug Use: No  . Sexual Activity: Not on file   Other Topics Concern  . Not on file   Social History Narrative   Patient is married Izell [place]) and lives at home with her husband.   Married 20+ years   From [place]   On disability for fibromyalgia   Caffeine Use: 1 soda daily     Constitutional: Denies fever, malaise, fatigue, headache or abrupt weight changes.  Respiratory:  Denies difficulty breathing, shortness of breath, cough or sputum production.   Cardiovascular: Denies chest pain, chest tightness, palpitations or swelling in the hands or feet.  Skin: Pt reports rash on back and burning sensation of skin. Denies redness, lesions or ulcercations.     No other specific complaints in a complete review of systems (except as listed in HPI above).  Objective:   Physical Exam    BP 120/78 mmHg  Pulse 90  Temp(Src) 98.4 F (36.9 C) (Oral)  Wt 172 lb (78.019 kg)  SpO2 98% Wt Readings from Last 3 Encounters:  10/02/14 172 lb (78.019 kg)  09/09/14 172 lb (78.019 kg)  06/10/14 166 lb 8 oz (75.524 kg)    General: Appears her stated age, well developed, well nourished in NAD. Skin: Warm, dry and intact. Comeodonic acne and some blackheads noted on superior back. The abnormal skin sensation is located bilaterally on both sides of the thoracic spine. Cardiovascular: Normal rate and rhythm. S1,S2 noted.  No murmur, rubs or gallops noted.  Pulmonary/Chest: Normal effort and positive vesicular breath sounds. No respiratory distress. No wheezes, rales or ronchi noted.  Musculoskeletal: Trapezius tender to palpation but no tension noted.  Neurological: Alert and oriented. Marland Kitchen   BMET    Component Value Date/Time   NA 139 09/09/2014 0904   K 4.2 09/09/2014 0904   CL 107 09/09/2014 0904   CO2 24 09/09/2014 0904   GLUCOSE 79 09/09/2014 0904   BUN 8 09/09/2014 0904   CREATININE 1.05 09/09/2014 0904   CALCIUM 9.3 09/09/2014 0904   GFRNONAA 59* 04/21/2014 1619   GFRAA 68* 04/21/2014 1619    Lipid Panel     Component Value Date/Time   CHOL 245* 02/26/2013 1053   TRIG 742.0* 02/26/2013 1053   HDL 34.20* 02/26/2013 1053   CHOLHDL 7 02/26/2013 1053   VLDL 148.4* 02/26/2013 1053    CBC    Component Value Date/Time   WBC 9.4 04/21/2014 1619   RBC 4.84 04/21/2014 1619   HGB 13.5 04/21/2014 1619   HCT 41.3 04/21/2014 1619   PLT 269 04/21/2014 1619   MCV  85.3 04/21/2014 1619   MCH 27.9 04/21/2014 1619   MCHC 32.7 04/21/2014 1619   RDW 13.6 04/21/2014 1619   LYMPHSABS 2.7 04/21/2014 1619   MONOABS 0.8 04/21/2014 1619   EOSABS 0.2 04/21/2014 1619   BASOSABS 0.1 04/21/2014 1619    Hgb A1C No results found for: HGBA1C  Assessment & Plan:   Abnormal skin sensation of her back:  This does not seem to be related to the acne on her back ? If it is neuropathic pain r/t her fibromyalgia She is already on gabapentin 400 mg TID, she reports she can not increase due to sever diarrhea She can not take Lyrica secondary to causing mania Advised her to continue ice if it is helping Advised her to keep her follow up appt with Derm  RTC as needed if symptoms persist or worsen

## 2014-10-02 NOTE — Patient Instructions (Signed)

## 2014-10-02 NOTE — Progress Notes (Signed)
Pre visit review using our clinic review tool, if applicable. No additional management support is needed unless otherwise documented below in the visit note. 

## 2014-10-08 NOTE — H&P (Signed)
PATIENT NAME:  Melissa [name], Melissa [name] MR#:  [id number] DATE OF BIRTH:  Nov 15, 1970  DATE OF ADMISSION:  02/03/2012  PRIMARY CARE PHYSICIAN: Dr. Lenon Oms  REFERRING PHYSICIAN: Dr. Ulice Brilliant  CHIEF COMPLAINT: Right side weakness and syncope today.   HISTORY OF PRESENT ILLNESS: 44 year old Caucasian female with past history of syncope, fibromyalgia and chronic pain, bipolar disorder, chronic migraine presented to the ED with right side weakness and syncope today. Patient is alert, awake, oriented in no acute distress. Patient said she started to have right side weakness, numbness and tingling about 2:00 p.m. while she was shopping. The weakness became worse so she tried to walk to her car and called her husband so her husband tool her to the truck. She said  she had neck pain and then passed out in her husband's truck. Patient was sent to urgent care where she passed out again. She said she cannot remember the time she passed out but she said it is a very short time. In addition she said she has a headache and dizziness but denies any incontinence or seizure activity. Patient's CAT scan of head and neck was normal. She is admitted to rule out cerebrovascular accident.   PAST MEDICAL HISTORY: As mentioned above:  1. Syncope. 2. Fibromyalgia. 3. Chronic pain. 4. Bipolar disorder. 5. Panic disorder. 6. Anxiety. 7. Chronic migraine. 8. Personality disorder. 9. Hyperlipidemia. 10. Gastroesophageal reflux disease. 11. Previous history of depression and suicidal ideation.   PAST SURGICAL HISTORY: Dilatation and curettage.    ALLERGIES: Penicillin, sulfa drugs.   HOME MEDICATIONS:  1. Allegra 180 mg p.o. daily.  2. Clonazepam 1 mg p.o. t.i.d. p.r.n. for anxiety.  3. Effexor XR 150 mg capsule 1 cap p.o. daily.  4. Famotidine 20 mg p.o. 1 to 2 tablets p.o. daily.  5. Neurontin 600 mg p.o. b.i.d.  6. Percocet 5/325 mg p.o. 1 tablet every six hours p.r.n.  7. Topamax 50 mg p.o. daily.  8. Trazodone 100  mg p.o. 2 tablets at bedtime.  9. Tylenol caplet extra strength 500 mg p.o. 2 tablets q.4 hours p.r.n.  10. Zanaflex 4 mg p.o. tablet 2 tablets once a day at bedtime and takes 2 more tablets during the day p.r.n.    SOCIAL HISTORY: Denies any smoking, alcohol drinking or illicit drugs.   FAMILY HISTORY: Mother has diabetes, but no stroke or heart attack in her family.    REVIEW OF SYSTEMS: CONSTITUTIONAL: Patient has no fever, chills but has a headache and dizziness and weakness. EYES: No double vision, blurred vision. ENT: No epistaxis, discharge. No dysphagia or slurred speech. RESPIRATORY: No cough, sputum, shortness of breath, or hematemesis. CARDIOVASCULAR: No chest pain, palpitation, orthopnea, or nocturnal dyspnea. No leg edema. PULMONARY: No cough, sputum, shortness of breath, or hemoptysis. GASTROINTESTINAL: No abdominal pain, nausea, vomiting, or diarrhea. No melena or bloody stool. GENITOURINARY: No dysuria, hematuria, or incontinence. HEMATOLOGY: No easy bruising, bleeding. ENDOCRINE: No polyuria, polydipsia but has cold intolerance. MUSCULOSKELETAL: Patient has fibromyalgia and has body pain all over including neck pain. NEUROLOGY: Positive for syncope, loss of consciousness but no seizure. Has right side weakness and numbness and tingling. PSYCH: Denies any anxiety or depression.   PHYSICAL EXAMINATION:  VITAL SIGNS: Temperature 97.3, blood pressure 137/80, pulse 90, respirations 18, oxygen saturation 97% on room air.   GENERAL: Patient is alert, awake, oriented in no acute distress.   HEENT: Pupils round, equal, reactive to light and accommodation. Moist oral mucosa. Clear oropharynx.   NECK: Supple. No  JVD or carotid bruits. No lymphadenopathy. No thyromegaly.   CARDIOVASCULAR: S1, S2 regular rate, rhythm. No murmurs, gallops.   PULMONARY: Bilateral air entry. No wheezing, rales. No use of accessory muscles to breathe.   ABDOMEN: Soft. No distention or tenderness. No  organomegaly. Bowel sounds present.   EXTREMITIES: No edema, clubbing, or cyanosis. No calf tenderness. Strong bilateral pedal pulses.    SKIN: No rash or jaundice.   NEUROLOGIC: Alert and oriented x3. Right upper and lower extremities weakness, power about 3 to 4/5. Sensation intact. Left side 5/5. No facial droop. Deep tendon reflexes 2+. Babinski negative.   LABORATORY, DIAGNOSTIC AND RADIOLOGICAL DATA: CBC normal. Glucose 83, BUN 8, creatinine 0.99. Electrolytes normal. CK 202. CAT scan of head without contrast no evidence of acute intracranial abnormality. CAT scan of cervical spine showed no acute abnormality. EKG showed sinus tachycardia at 50 beats per minute.   IMPRESSION:  1. Right side weakness, rule out cerebrovascular accident.  2. Syncope, unknown etiology.  3. Sinus bradycardia.  4. Hyperlipidemia.  5. History of fibromyalgia and chronic pain. 6. Bipolar disorder. 7. Chronic migraine. 8. Gastroesophageal reflux disease. 9. Personality disorder.   PLAN OF TREATMENT: Patient will be admitted to medical floor with telemonitor. Will get MRI of brain, carotid duplex, echocardiograph. Will get neuro check. Also will start aspirin 325 mg p.o. daily and start Zocor 20 mg p.o. at bedtime and check lipid panel. Also will give pain control for fibromyalgia and chronic pain. GI and deep vein thrombosis prophylaxis.  Discussed patient's situation and plan of treatment with patient.   TIME SPENT: About 60 minutes.   ____________________________ Demetrios Loll, MD qc:cms D: 02/03/2012 21:14:44 ET T: 02/04/2012 07:10:24 ET JOB#: [id number]  cc: Demetrios Loll, MD, <Dictator> Dr. Lenon Oms   Demetrios Loll MD ELECTRONICALLY SIGNED 02/05/2012 13:38

## 2014-10-08 NOTE — Discharge Summary (Signed)
PATIENT NAME:  Melissa [name], Melissa [name] MR#:  [id number] DATE OF BIRTH:  04/15/1971  DATE OF ADMISSION:  02/03/2012 DATE OF DISCHARGE:  02/04/2012  DISCHARGE DIAGNOSES:  1. Right-sided weakness, likely due to underlying fibromyalgia. No stroke or transient ischemic attack. 2. Hypercholesterolemia. The patient would not tolerate statin medication due to her fibromyalgia and reaction. She was recommended to start garlic tablet and fish oil tablets.  SECONDARY DIAGNOSES:  1. History of syncope. 2. Fibromyalgia.  3. Chronic pain. 4. Bipolar disorder.  5. Panic disorder. 6. Anxiety. 7. Chronic migraines. 8. Personality disorder.  9. Hyperlipidemia.  10. Gastroesophageal reflux disease. 11. Depression and suicidal ideation.   CONSULTANTS: Physical therapy.   PROCEDURES/RADIOLOGY: CT scan of the head without contrast on 02/03/2012 showed no acute intracranial abnormality.   CT scan of the cervical spine without contrast on 02/03/2012 showed no acute osseous abnormality.   Thoracic spine x-ray on 02/03/2012 showed no evidence of acute thoracic spine abnormality.   MRI of the brain without contrast showed no acute abnormalities.   Bilateral carotid Doppler's on 02/03/2012 showed no evidence of hemodynamically significant stenosis.   2-D echocardiogram on 02/04/2012 showed mild to moderate mitral regurgitation, mild tricuspid regurgitation, and ejection fraction of more than 55%.  HISTORY AND SHORT HOSPITAL COURSE: The patient is a 44 year old female with the above-mentioned medical problems who was admitted for right-sided weakness. There was a concern for possible stroke and/or transient ischemic attack, but she had a complete negative neurological work-up, as dictated above. She was feeling much better. She was evaluated by physical therapy who recommended discharging home. She did not have any skilled therapy needs.  DISCHARGE PHYSICAL EXAMINATION:  VITALS: On the day of discharge her  temperature was 98, heart rate 79 per minute, respirations 18 per minute, blood pressure 138/72 mmHg, and she was saturating 96% on room air.   CARDIOVASCULAR: S1 and S2 normal. No murmurs, rubs, or gallops.   LUNGS: Clear to auscultation bilaterally. No wheezing, rales, rhonchi, or crepitation.   ABDOMEN: Soft and benign.   NEUROLOGIC: She had about 2 to 4/5 strength on the right side. There was some inconsistency with her strength and functional mobility. All other physical examination remained at baseline.   DISCHARGE MEDICATIONS:  1. Allegra 180 mg p.o. daily. 2. Effexor-XR 150 mg p.o. daily. 3. Clonazepam 1 mg two or three times daily as needed.  4. Famotidine 20 mg 1 to 2 tablets p.o. daily. 5. Neurontin 600 mg p.o. twice a day. 6. Percocet 5/325 mg 1 tablet p.o. every six hours as needed.  7. Trazodone 100 mg 2 tablets p.o. at bedtime. 8. Zanaflex 8 mg p.o. at bedtime and can take 2 tablets more during the day as needed.  9. Tylenol 1000 mg p.o. every four hours as needed.  10. Topamax 50 mg p.o. daily.   DISCHARGE DIET: Low sodium.   DISCHARGE ACTIVITY: As tolerated.   DISCHARGE INSTRUCTIONS AND FOLLOW-UP: The patient was instructed to follow-up with her primary physician, Dr. Lenon Oms, in 1 to 2 weeks. She was also instructed to follow-up with [facility] in 2 to 4 weeks.   TOTAL TIME DISCHARGING THIS PATIENT: 55 minutes. ____________________________ Lucina Mellow. Manuella Ghazi, MD vss:slb D: 02/06/2012 23:52:23 ET T: 02/07/2012 11:52:28 ET JOB#: [id number]  cc: Alverna Fawley S. Manuella Ghazi, MD, <Dictator> PCP - Lenon Oms, MD [facility] Lucina Mellow [facility] MD ELECTRONICALLY SIGNED 02/08/2012 10:34

## 2014-10-11 NOTE — H&P (Signed)
PATIENT NAME:  Melissa [name], Melissa [name] MR#:  [id number] DATE OF BIRTH:  [birth year]  DATE OF ADMISSION:  11/04/2012  PRIMARY CARE PHYSICIAN: Regan Rakers, MD  ER PHYSICIAN: Dr. Corky Downs   CHIEF COMPLAINT: Chest pain, trouble breathing.   HISTORY OF PRESENT ILLNESS: This 44 year old female patient came in because of chest pain and shortness of breath. Chest pain started about two days ago in the middle of the chest, not radiating to the arms or the back, constant, no relieving factors, no aggravating factors. The patient also has trouble breathing that started today. The patient says that she is having shortness of breath, unable to ambulate. No orthopnea or PND. The patient has shortness of breath all the time. The patient was evaluated in the Emergency Room when she came. Her heart rate was around 100, so the patient was seen by ER doctor and the patient thought she had a PE. CT chest was done, which was negative for pulmonary emboli and the ER doctor went to discharge the patient but the patient was unable to walk even to the commode with severe weakness in the legs and the patient was referred for admission. History obtained from the patient. The patient says that she is on prednisone for fullness in the ear and eustachian tube dysfunction. Seen Dr. Kathyrn Sheriff since February of this year and she is taking prednisone for that. The patient says the chest pain and trouble breathing recently started. The patient says that she lost her dog on Wednesday and she is under a lot of stress with stress in the family and she is on disability with money issues. History is also obtained from the patient and patient's wife according to them. The patient unable to ambulate since this morning. She had to literally crawl from the bathroom to the bed with weakness in hands and legs. She also complains that she is not able to eat or drink for the past two days.   PAST MEDICAL HISTORY: Significant for fibromyalgia, bipolar depression,  history of anxiety, hyperlipidemia, GERD, history of depression and suicidal ideation, chronic migraines.   ALLERGIES: ERYTHROMYCIN, PENICILLIN AND SULFA.   SOCIAL HISTORY: No smoking, no drinking, no drugs.   PAST SURGICAL HISTORY: History of D and C.   MEDICATIONS: She is on multiple medications including Percocet 5/325 t.i.d., clonazepam 1 mg p.o. b.i.d., Diazepam 5 mg p.o. every 8 hours as needed, Neurontin 600 mg p.o. b.i.d., naproxen 500 mg p.o. b.i.d., Ondansetron 4 mg t.i.d., prednisone 10 mg daily; she has 2 more doses to go, Premarin one dose vaginally 2 times daily, (Dictation Anomaly) 20 mg q.6 hours, Tizanidine 4 mg p.o. t.i.d., Topamax 50 mg p.o. b.i.d., trazodone 100 mg p.o. daily, venlafaxine 75 mg; she takes 2 capsules in the morning, Ventolin 90 mcg, 2 puffs 4 times daily.   FAMILY HISTORY: No hypertension or diabetes. The patient's uncle had lung cancer.   SOCIAL HISTORY: As I mentioned. The patient used to work in a [employer] in [place] for 4 years.   REVIEW OF SYSTEMS:  CONSTITUTIONAL: Feels very weak and tired.  EYES: No blurred vision.  ENT: No tinnitus. No epistaxis. No difficulty swallowing.  RESPIRATORY: Shortness of breath since this morning. Denies any cough or wheezing. No chronic obstructive pulmonary disease.  CARDIOVASCULAR: Complains of mid-sternal chest pain. No orthopnea, no PND, no palpitations.  GASTROINTESTINAL: No nausea. No vomiting. No abdominal pain.  GENITOURINARY: No dysuria.  ENDOCRINE: No diabetes.  HEMATOLOGIC: No anemia.  INTEGUMENTARY: No skin rashes.  MUSCULOSKELETAL: Complains of joint pains and chronic fibromyalgia.  NEUROLOGIC: The patient feels weak in her legs and hands. No dysarthria, no epilepsy. Has chronic migraines.  PSYCHIATRIC: Has anxiety, bipolar disorder.   PHYSICAL EXAMINATION: VITAL SIGNS: Temperature 97.7, heart rate is 100, blood pressure 117/72, saturation 96% on room air.  GENERAL: She is an alert, awake, oriented female  slightly in distress because of her financial issues and stress. The patient also feels so weak in her legs and feels very sad about that.  HEENT: Head atraumatic, normocephalic. Pupils equally reacting to light. Extraocular movements are intact.  ENT: No tympanic membrane congestion. No turbinate hypertrophy. No oropharyngeal erythema.  NECK: Normal range of motion. No JVD. No carotid bruit. Thyroid is in the midline.  RESPIRATORY: Clear to auscultation. No use of accessory muscles for respiration. CARDIOVASCULAR: S1, S2 regular. No murmurs.  LUNGS: Clear to auscultation.  GASTROINTESTINAL: Abdomen is soft, nontender, nondistended. Bowel sounds present. No CVA tenderness.  MUSCULOSKELETAL: The patient has decreased strength in upper extremities and lower extremities. Complains of pain in the joints.  SKIN: Has no rashes.  NEUROLOGIC: Alert, awake, oriented. Cranial nerves II through XII intact. Power 5/5 in upper and lower extremities. Sensation intact. Deep tendon reflexes 2+ bilaterally.  PSYCHIATRIC: The patient is depressed. Judgment is intact.   LABORATORY, DIAGNOSTIC AND RADIOLOGICAL DATA: CT of the chest showed no evidence of PE, no evidence of acute pathology. Cardiac chambers are top-normal range. Mildly increased interstitial markings in the lungs, and she has a small pleural effusion.   Chest x-ray shows slightly increased prominence of pulmonary interstitial markings.   The patient's WBC 15.6, hemoglobin 14, hematocrit 41.8, platelets 267. The patient's electrolytes: Sodium is 141, potassium 4.1, chloride 110, bicarbonate 20, BUN 17, creatinine 1.06, glucose 150, troponin less than 0.02. BNP 0.02.   The patient's EKG shows normal sinus, no ST-T changes.   ASSESSMENT AND PLAN: A 44 year old female patient with nonspecific complaints of chest pain, shortness of breath, moderate weakness, unable to ambulate. The patient's symptoms mostly consistent with anxiety and stress reaction  rather than acute problem. Even though the CT chest shows some pulmonary infiltrate, the patient has no wheezing and no chronic obstructive pulmonary disease changes. The patient can have outpatient pulmonary followup regarding her chest pain. She already had troponins negative. EKG is normal. Likely secondary to anxiety and stress with the family and recent loss of her dog and had to close her house with financial issues. The patient referred for psychiatry consult. She said she has a psychiatrist, Dr. Leisure centre manager) at [facility]. She does not want to see any psychiatrist here. For her bipolar, we are going to continue her medications. Also, for her depression, we are going to continue her clonazepam and also trazodone and Topamax. The patient will be hydrated with (Dictation Anomaly) of normal saline for about a liter. For her weakness, hopefully she should get better by tomorrow. Her CK is also normal, so I believe it is most likely stress induced, so watch her overnight and see how she does. The patient will have Ventolin for her inhalers and she wanted me to check vitamin D levels for her muscle weakness and joint pains, so we are going to order vitamin D and she will be on observation status.   Time spent on history and physical about 55 minutes. Discussed the plan with the patient and patient's husband.    ____________________________ Epifanio Lesches, MD sk:aw D: 11/04/2012 41:74:08 ET T:  11/05/2012 05:15:23 ET JOB#: [id number]  cc: Epifanio Lesches, MD, <Dictator> Epifanio Lesches MD ELECTRONICALLY SIGNED 11/14/2012 22:58

## 2014-10-11 NOTE — Consult Note (Signed)
Referring Physician:  Epifanio Lesches :   Primary Care Physician:  Epifanio Lesches : [facility], [facility], [address] [place], [place] [address]  Reason for Consult: Admit Date: 04-Nov-2012  Chief Complaint: chest pain  Reason for Consult: weakness   History of Present Illness: History of Present Illness:   44 yo RHD F presents with chest pain and anxiety.  One day into admission she states that she is just diffusely weak.  No numbness, tingling, vision changes or new pain.  Pt has constant diffuse pain from fibromyalgia.  Pt denies diplopia, dysphagia or dyspnea as well.  She states that her weakness is actually better today but that is it is generalized and nonspecific.  Pt denies previous episode like this.  ROS:  General weakness  fatigue   HEENT ear pain   Lungs SOB   Cardiac chest pain   GI no complaints   GU no complaints   Musculoskeletal back pain   Skin no complaints   Neuro diffuse weakness   Endocrine no complaints   Psych no complaints   Past Medical/Surgical Hx:  Hyperlipidemia:   Anxiety:   migraines:   bipolar:   depression:   Fibromyalgia:   D&C - Dilation and Curretage:   Past Medical/ Surgical Hx:  Past Medical History as above   Past Surgical History as above   Home Medications: Medication Instructions Last Modified Date/Time  ciprofloxacin 500 mg oral tablet 1 tab(s) orally every 12 hours x 5 days 18-May-14 10:11  Ventolin HFA 90 mcg/inh inhalation aerosol 1-2 puffs  inhaled 4 times a day, As Needed- for Shortness of Breath , for Wheezing  17-May-14 21:42  ondansetron 4 mg oral tablet 1 tab(s) orally 3 times a day, As Needed - for Nausea, Vomiting 17-May-14 21:42  naproxen 500 mg oral tablet 1 tab(s) orally 2 times a day 17-May-14 21:42  acetaminophen-hydrocodone 325 mg-5 mg oral tablet 1 tab(s) orally 3 times a day, As Needed- for Pain  17-May-14 21:42  predniSONE 10 mg oral tablet  Taper as directed 17-May-14 21:42  diazepam 5 mg oral tablet 1 tab(s) orally every 8 hours, As Needed - for Anxiety or sleep 17-May-14 21:42  Topamax 50 mg oral tablet 1 tab(s) orally 2 times a day 17-May-14 21:42  promethazine 25 mg oral tablet 1 tab(s) orally every 6 hours, As Needed - for Nausea 17-May-14 21:42  Premarin Vaginal 0.625 mg/g vaginal cream with applicator 1 dose(s) vaginal 2 times a week 17-May-14 21:42  medroxyPROGESTERone 150 mg/mL intramuscular suspension 1 milliliter(s) intramuscular every 11-12 weeks 17-May-14 21:42  venlafaxine extended release 75 mg oral capsule, extended release 2 cap(s) orally once a day (in the morning) and 1 cap in the afternoon 17-May-14 21:42  gabapentin 600 mg oral tablet 2 tabs (1263m) orally once a day. 17-May-14 21:42  trazodone 100 mg oral tablet 1 to 2 tab(s) orally once a day (at bedtime). 17-May-14 21:42  clonazepam 1 mg oral tablet 1 tab(s) orally 2 times a day, As Needed 17-May-14 21:42  tiZANidine 4 mg oral capsule 1 cap(s) orally 3 times a day, As Needed 17-May-14 21:42   Allergies:  Sulfa drugs: Anaphylaxis, Swelling  PCN: Swelling  Azithromycin: Swelling  Allergies:  Allergies see above   Social/Family History: Lives With: spouse  Social History: denies EtOH, tob and illicits  Family History: n/c   Vital Signs: **Vital Signs.:   19-May-14 15:24  Vital Signs Type Routine  Temperature Temperature (F) 98.8  Celsius 37.1  Temperature Source oral  Pulse Pulse 65  Respirations Respirations 20  Systolic BP Systolic BP 353  Diastolic BP (mmHg) Diastolic BP (mmHg) 66  Mean BP 78  Pulse Ox % Pulse Ox % 95  Pulse Ox Activity Level  At rest  Oxygen Delivery Room Air/ 21 %   Physical Exam: General: NAD but extremely anxious, overweight  HEENT: normocephalic, sclera nonicteric, oropharynx clear  Neck: supple, no JVD, no bruits  Chest: CTA B, no wheezing, good movement  Cardiac: RRR, no murmurs, no edema, 2+ pulses   Extremities: no C/C/E, FROM   Neurologic Exam: Mental Status: alert and oriented x 3, normal speech and language, follows complex commands  Cranial Nerves: PERRLA, EOMI, nl VF, face symmetric, tongue midline, shoulder shrug equal  Motor Exam: 4/5 B UE, 2/5 B LE, nl tone;  pt puts no effort into exam; + Hoover B  Deep Tendon Reflexes: 2+/4 B, plantars downgoing B, no Hoffman  Sensory Exam: pinprick, temperature, and vibration intact B  Coordination: F to N normal, does not try to walk or tap shinsM   Lab Results: Hepatic:  17-May-14 16:37   Bilirubin, Total 0.2  Alkaline Phosphatase 70  SGPT (ALT) 30  SGOT (AST) 21  Total Protein, Serum 6.8  Albumin, Serum 3.6  Routine Chem:  17-May-14 16:37   B-Type Natriuretic Peptide ([facility]) 42 (Result(s) reported on 04 Nov 2012 at 07:38PM.)  eGFR (African American) >60  eGFR (Non-African American) >60 (eGFR values <63m/min/1.73 m2 may be an indication of chronic kidney disease (CKD). Calculated eGFR is useful in patients with stable renal function. The eGFR calculation will not be reliable in acutely ill patients when serum creatinine is changing rapidly. It is not useful in  patients on dialysis. The eGFR calculation may not be applicable to patients at the low and high extremes of body sizes, pregnant women, and vegetarians.)  18-May-14 05:47   Glucose, Serum 79  BUN 14  Creatinine (comp) 1.03  Sodium, Serum 142  Potassium, Serum 3.9  Chloride, Serum  112  CO2, Serum 23  Calcium (Total), Serum  8.1  Anion Gap 7 (Result(s) reported on 05 Nov 2012 at 06:39AM.)  Osmolality (calc) 283  Cardiac:  17-May-14 16:37   CPK-MB, Serum  < 0.5 (Result(s) reported on 04 Nov 2012 at 05:12PM.)  Troponin I < 0.02 (0.00-0.05 0.05 ng/mL or less: NEGATIVE  Repeat testing in 3-6 hrs  if clinically indicated. >0.05 ng/mL: POTENTIAL  MYOCARDIAL INJURY. Repeat  testing in 3-6 hrs if  clinically indicated. NOTE: An increase or decrease  of 30% or  more on serial  testing suggests a  clinically important change)  19-May-14 14:12   CK, Total 31 (Result(s) reported on 06 Nov 2012 at 02:35PM.)  Routine UA:  17-May-14 16:50   Color (UA) Yellow  Clarity (UA) Clear  Glucose (UA) Negative  Bilirubin (UA) Negative  Ketones (UA) Negative  Specific Gravity (UA) 1.012  Blood (UA) Negative  pH (UA) 7.0  Protein (UA) Negative  Nitrite (UA) Negative  Leukocyte Esterase (UA) Trace (Result(s) reported on 04 Nov 2012 at 05:10PM.)  RBC (UA) 1 /HPF  WBC (UA) 3 /HPF  Bacteria (UA) NONE SEEN  Epithelial Cells (UA) 1 /HPF  Mucous (UA) PRESENT  Amorphous Crystal (UA) PRESENT (Result(s) reported on 04 Nov 2012 at 05:10PM.)  Routine Sero:  17-May-14 16:50   Pregnancy Test, Urine NEGATIVE (The results of the qualitative urine HCG (Pregnancy Test) should be evaluated in light of other  clinical information.  There are limitations to the test which, in certain clinical situations, may result in a false positive or negative result. Thehigh dose hook effect can occur in urine samples with extremely high HCG concentrations.  This effect can produce a negative result in certain situations. It is suggested that results of the qualitative HCG be confirmed by an alternate methodology, such as the quantitative serum beta HCG test.)  Routine Hem:  18-May-14 05:47   WBC (CBC)  13.0  RBC (CBC) 4.51  Hemoglobin (CBC) 12.9  Hematocrit (CBC) 38.1  Platelet Count (CBC) 223  MCV 85  MCH 28.7  MCHC 33.9  RDW  14.7  Neutrophil % 59.2  Lymphocyte % 32.5  Monocyte % 7.3  Eosinophil % 0.6  Basophil % 0.4  Neutrophil #  7.7  Lymphocyte #  4.2  Monocyte #  1.0  Eosinophil # 0.1  Basophil # 0.1 (Result(s) reported on 05 Nov 2012 at 06:39AM.)   Radiology Results: MRI:    19-May-14 10:01, MRI Brain Without Contrast  MRI Brain Without Contrast   REASON FOR EXAM:    cva  COMMENTS:       PROCEDURE: MR  - MR BRAIN WO CONTRAST  - Nov 06 2012 10:01AM      RESULT: History: CVA    Technique: Multiplanar, multisequence MRI of the brain was obtained   without IV contrast.     Comparison:  None    Findings:     There is no acute infarct. There is no hemorrhage. There is no pathologic     extra-axial fluid collection.   There is no hydrocephalus. The ventricles   are normal for age. The basal cisterns are patent. There are no abnormal   extra-axial fluid collections.     There is mild right mastoid sinus mucosal thickening. The skull base and   calvarium demonstrate normal signal. The major intracranial flow voids,   including dural venous sinuses appear patent.    IMPRESSION:     No acute intracranial pathology.    Right mastoid sinus disease.    Dictation Site: 1    Verified By: Jennette Banker, M.D., MD  CT:    18-May-14 12:02, CT Head Without Contrast  CT Head Without Contrast   REASON FOR EXAM:    dizziness  COMMENTS:       PROCEDURE: CT  - CT HEAD WITHOUT CONTRAST  - Nov 05 2012 12:02PM     RESULT: Axial noncontrast CT scanning was performed through the brain   with reconstructions at 5 mm intervals and slice thicknesses. Comparison   is  made to study of February 03, 2012.    There is mild stable bifrontal cerebral atrophy. The ventricles are   normal in size and position. There is no intracranial hemorrhage nor   intracranial mass effect. The cerebellum and brainstem are normal in   density. There is no evidence of an evolving ischemic infarction. At bone   window settings the observed portions of the paranasal sinuses and   mastoid air cells are clear. There is no evidence of an acute skull     fracture.    IMPRESSION:   1. There is no evidence of an acute ischemic or hemorrhagic infarction.   2. There is no intracranial mass effect or hydrocephalus.   3. There is mild stable bifrontal cerebral atrophy.     Dictation Site: 5        Verified By: DAVID A. Martinique, M.D., MD  Radiology  Impression: Radiology Impression: MRI of brain personally reviewed by me and completely normal   Impression/Recommendations: Recommendations:   labs reviewed by me and normal notes reviewed by me   Generalized weakness-  this is not neurologic in origin and does not fit typical neurologic pathways.  Pt clearly does not try on exam and + hoover eludes to malingering vs. conversion.  There is a small chance that this could be steroid myopathy but the pattern of weakness would not be this either.  This is not myasthenia, AIDP or any other acute neurologic process including myositis with normal labs and exam. no further neurologic assessment necessary PT/OT d/c steroids will sign off  Electronic Signatures: Jamison Neighbor (MD)  (Signed 19-May-14 17:38)  Authored: REFERRING PHYSICIAN, Primary Care Physician, Consult, History of Present Illness, Review of Systems, PAST MEDICAL/SURGICAL HISTORY, HOME MEDICATIONS, ALLERGIES, Social/Family History, NURSING VITAL SIGNS, Physical Exam-, LAB RESULTS, RADIOLOGY RESULTS, Recommendations   Last Updated: 19-May-14 17:38 by Jamison Neighbor (MD)

## 2014-10-11 NOTE — Discharge Summary (Signed)
PATIENT NAME:  Melissa [name], Melissa [name] MR#:  [id number] DATE OF BIRTH:  Oct 22, 1970  DATE OF ADMISSION:  11/04/2012 DATE OF DISCHARGE:    ADMISSION DIAGNOSES:  1.  Atypical chest pain.  2.  Weakness.   DISCHARGE DIAGNOSES:  1.  Atypical chest pain.  2.  Weakness, malingering versus conversion disorder versus steroid-induced.  3.  Bipolar affective disorder.  4.  Shortness of breath, improved.   CONSULTS: Neurology.   PERTINENT IMAGING: CT of the chest showed no evidence of a pulmonary emboli or pneumonia. CT of the head showed no acute intracranial hemorrhage or CVA. MRI of the brain showed no evidence of acute intracranial pathology.   HOSPITAL COURSE: This is a 44 year old female with a history of fibromyalgia and bipolar affective disorder, who presented with atypical chest pain/vague symptoms of shortness of breath and weakness. For further details, please refer to the history and physical.  1.  Atypical chest pain. The patient's troponins were negative. Telemetry was negative. Unclear as to the etiology. I suspect this could be related to her fibromyalgia. Her chest pain seems to be constant in nature, lasting just days. Her CT of the chest showed no PE. This is not a typical presentation for an acute MI or acute coronary syndrome.  2.  Weakness. The patient apparently was unsteady on her feet. She was  week, so we obtained a physical therapy consult. We also obtained a neurology consult after a CT of the head and MRI were normal. As per neurology, they did not feel it was neurological origin and does not fit typical neurological pathways. The patient was not trying on her physical examination. This was either malingering versus conversion disorder. A small chance it could be steroid myopathy, but the pattern of weakness would not be consistent with this. At this time, physical therapy recommended home with home health care or skilled nursing facility for short-term placement; however, the patient only  wants to go to a skilled nursing if her insurance covers it, so we are awaiting this for now. Out discharge plan is home with home health care.  3.  Shortness of breath. Her CT of the chest was negative for PE. This actually has resolved. She was never hypoxic.  4.  Bipolar affective disorder. The patient was continued on medications. 5.  Fibromyalgia. The patient will continue her outpatient medications.  6.  Urinary tract infection. The patient was treated with ciprofloxacin. No urine culture was ordered.  DISCHARGE MEDICATIONS:  1.  Diazepam 5 mg every 8 hours p.r.n. anxiety.  2.  Topamax 50 mg b.i.d.  3.  Promethazine 25 mg every 6 hours p.r.n. nausea.  4.  Premarin 0.625 mg 3 times a week.  5.  Medroxyprogesterone 1 mL every 11 to 12 weeks.  6.  Effexor 75 mg 2 tablets a.m. and 1 tablet in the afternoon.  7.  Percocet 5/325 t.i.d. p.r.n. pain.  8.  Naproxen 500 mg b.i.d.  9.  Zofran 4 mg t.i.d. p.r.n. nausea.  10. Ventolin HFA 1 to 2 puffs 4 times a day p.r.n. shortness of breath.  11. Gabapentin 600 mg 2 tablets daily.  12. Trazodone 100 mg 1 to 2 tablets at bedtime.  13. Clonazepam 1 mg b.i.d. p.r.n.  14. Tizanidine 4 mg t.i.d. p.r.n.  15. Ciprofloxacin 500 mg q. 12 hours for 5 days for urinary tract infection.   DISCHARGE PLAN: Right now is for discharge with home health care and physical therapy.   DISCHARGE DIET: Regular diet.  DISCHARGE FOLLOWUP: She will follow up with Dr. Regan Rakers, her primary care physician, in 1 to 2 weeks.    TIME SPENT: Approximately 35 minutes.  ____________________________ Donell Beers. Benjie Karvonen, MD spm:aw D: 11/07/2012 14:06:05 ET T: 11/07/2012 14:25:29 ET JOB#: [id number]  cc: Briceson Broadwater P. Benjie Karvonen, MD, <Dictator> Regan Rakers, MD Donell Beers Kalisi Bevill MD ELECTRONICALLY SIGNED 11/07/2012 15:04

## 2014-10-13 NOTE — Consult Note (Signed)
PATIENT NAME:  Melissa [name], Melissa [name] MR#:  [id number] DATE OF BIRTH:  Sep 28, 1970  DATE OF CONSULTATION:  06/03/2011  REFERRING PHYSICIAN:  Loletha Grayer, MD  CONSULTING PHYSICIAN:  Hafiz Irion K. Manuella Ghazi, MD  REASON FOR CONSULTATION: Headache and left hemibody weakness and numbness.   HISTORY OF PRESENT ILLNESS: [name] is a 44 year old Caucasian female with a history of fibromyalgia, bipolar disorder, panic disorder, chronic migraine, histrionic personality disorder, etc. who was having diarrhea since November of 2012 and was "dehydrated". She went to Urgent Care on [facility] on 06/01/2011. While getting IV fluids, she had sudden onset of numbness on the top of the head up to the upper lip and then after 30 minutes the numbness came suddenly to the jaw area bilaterally.   After some time she felt that her left side was weak and numb (she had left-sided weakness for the last six months but it just got worse).   She also had significant pain in the left side, some chest pain which has improved.   She presently feels like the weakness has not improved. She has received MRI of the brain and cervical spine which have been unremarkable.   The patient had some difficulty breathing and some shortness of breath, was transferred to the [facility] ER and got hospitalized. She had Neurology evaluation.   The patient also complained of sudden pop in her head with headache which went down all over her body.   Medication-wise for her fibromyalgia, she has been tried on Lyrica. She takes Gabapentin 600 mg 4 times a day which does help her.   She mentioned that lately her life has been very stressful. The husband is out of work due to back pain on Workers Comp. She used to work as a Education officer, community and she is applying for [employer] disability as well as fighting to get short-term disability. Currently they are living off of their retirement.   PAST MEDICAL HISTORY:  1. Recurrent syncope related to  dehydration. Last year she was evaluated at [facility] with negative work-up.  2. Fibromyalgia, chronic pain (diagnosed since around the mid 1990's).. 3. Bipolar disorder. 4. Panic disorder. 5. Chronic migraine. 6. Histrionic personality disorder.  7. Hyperlipidemia.  8. Gastroesophageal reflux disease.  9. History of admission for depression and suicidal ideation.   PAST SURGICAL HISTORY: Significant for D and C.   ALLERGIES: She is allergic to penicillin and sulfa.  MEDICATIONS: I reviewed her hospital medication list.   REVIEW OF SYSTEMS: Positive for headache, facial numbness, left hemibody weakness, numbness, occasional chest pain, stomach pain, difficulty with urination, feeling of dryness, poor balance etc.   I reviewed her labs. I reviewed her MRI of the brain and cervical spine which were unremarkable.   PHYSICAL EXAMINATION:   VITAL SIGNS: Temperature 98.2, pulse 91, respiratory rate 18, blood pressure 117/75, pulse oximetry 96% on 2 liters.   GENERAL: She is a middle-aged Caucasian female lying in bed not in acute distress with the lights on. When I asked her if she was able to sit up and sit on the side of the bed, I did not see any neurological neglect or any difficulty with movement of the left upper and lower extremity. (Prosody of the movement of the left hand was normal). She used both the hands to put her hair back to take the nasal cannula out, put her eyeglasses on, etc.   She did use her left hand to put on the rail and on  the bed to adjust herself.   LUNGS: Clear to auscultation.   HEART: S1, S2 heart sounds. Carotid exam did not reveal any bruit.   MENTAL STATUS: She was alert, oriented. She provided full history. Occasionally she would have to ask the husband exactly what happened but she was able to recall all the events really well.   Her attention and concentration seem to be okay.   On her cranial nerve exam, her pupils are equal, round, and reactive.  Extraocular movements are intact. Her face was symmetric. Tongue was midline. Facial sensations were intact. Her visual fields seemed full. Her hearing was intact.   On her motor exam, she has normal tone. She had some give way weakness of the left upper extremity (she did not squeeze my left hand fingers as hard and she did not push with the left upper extremity but prosody of the movement was normal as well as when she stood up she did put good enough pressure with her left hand to maintain balance).   She took a couple of steps forward and backward and her left leg strength seemed to be normal at the time.   Her reflexes are symmetric +3. Her toes were downgoing. She does not have a Hoffmann sign.   Her gait was normal.   ASSESSMENT AND PLAN:  1. Headache. I had an extensive discussion with the patient regarding nonpharmacological treatment of the headache. I strongly advised the patient to refrain from narcotics for the headache which usually complicates the headache and increases chances of having medication rebound headache. She is on a prophylactic medication, Topamax. I gave her a reference of Dolovent which is a supplement for headache. If they cannot buy this proprietary combination, I gave them contents and they should be able to get it cheap formulation over-the-counter.  2. For her fibromyalgia, she should keep her appointment with [place] fibromyalgia specialist.  3. For her left-sided weakness and numbness and whole face numbness which is difficult to localize, she also has negative MRI of the brain and cervical spine and negative EEG.   I feel like further invasive and noninvasive diagnostic work-up for this might not be indicated at this time.   The patient seems to be agreeable to this plan and asked me that when she can be released. I will leave that up to Dr. Leslye Peer. Feel free to contact me with any further questions. I will see this patient on an as necessary basis.    ____________________________ Royetta Crochet. Manuella Ghazi, MD hks:drc D: 06/03/2011 17:40:30 ET T: 06/04/2011 06:41:18 ET JOB#: [id number]  cc: Amberia Bayless K. Manuella Ghazi, MD, <Dictator> Royetta Crochet [facility] MD ELECTRONICALLY SIGNED 06/25/2011 9:08

## 2014-10-16 ENCOUNTER — Ambulatory Visit (INDEPENDENT_AMBULATORY_CARE_PROVIDER_SITE_OTHER): Payer: PPO | Admitting: Primary Care

## 2014-10-16 ENCOUNTER — Encounter: Payer: Self-pay | Admitting: Primary Care

## 2014-10-16 VITALS — BP 118/82 | HR 88 | Temp 98.3°F | Ht 69.0 in | Wt 175.8 lb

## 2014-10-16 DIAGNOSIS — G43911 Migraine, unspecified, intractable, with status migrainosus: Secondary | ICD-10-CM | POA: Diagnosis not present

## 2014-10-16 MED ORDER — KETOROLAC TROMETHAMINE 60 MG/2ML IM SOLN
60.0000 mg | Freq: Once | INTRAMUSCULAR | Status: AC
Start: 2014-10-16 — End: 2014-10-16
  Administered 2014-10-16: 60 mg via INTRAMUSCULAR

## 2014-10-16 NOTE — Progress Notes (Signed)
Subjective:    Patient ID: Melissa [name], female    DOB: 1970/11/11, 44 y.o.   MRN: [id number]  HPI  Melissa [name] is a 44 year old female who presents today with a chief complaint of migraine headache. The headache has been present since last Friday evening with waxing and waning of symptoms, but has not completely dissapated. Her migraine is located to the left parietal side of cranium. She describes her pain as throbbing and constant. She has taken Imitrex yesterday (1 tablet then 2 hours later took another), phenergan, benadryl, tramadol, percocet, Excedrin migraine, and tylenol with some relief but without abortive effects. She was on preventative medication in the past (Topamax) but has not taken in a year or so due to concerns for memory loss. She has photophobia and phonophobia.   Review of Systems  Constitutional: Negative for fever and chills.  Eyes: Positive for photophobia. Negative for pain.  Respiratory: Negative for shortness of breath.   Cardiovascular: Positive for chest pain.  Gastrointestinal: Positive for nausea. Negative for vomiting.  Neurological: Positive for headaches. Negative for dizziness, weakness and numbness.       Past Medical History  Diagnosis Date  . Bipolar disorder   . Fibromyalgia     on disability  . Migraine   . Panic attack   . Muscle spasm   . Uterine perforation     hx of  . Syncope     recurrent  . POTS (postural orthostatic tachycardia syndrome)   . Pain of right side of body     chronic  . Arthritis     History   Social History  . Marital Status: Married    Spouse Name: Melissa [name]  . Number of Children: 0  . Years of Education: HS/college   Occupational History  .  Other    disabled   Social History Main Topics  . Smoking status: Never Smoker   . Smokeless tobacco: Never Used  . Alcohol Use: No     Comment: 1 holiday drink yearly  . Drug Use: No  . Sexual Activity: Not on file   Other Topics Concern  . Not on file    Social History Narrative   Patient is married Melissa [name]) and lives at home with her husband.   Married 20+ years   From [place]   On disability for fibromyalgia   Caffeine Use: 1 soda daily    Past Surgical History  Procedure Laterality Date  . Dilation and curettage of uterus    . Cataract extraction  dec 2014  . Lasik  dec 2014    Family History  Problem Relation Age of Onset  . Diabetes Mother   . Hypertension Mother   . Cancer Father     brain tumor  . Cancer Maternal Grandmother   . Cancer Maternal Grandfather   . Cancer Paternal Grandmother   . Cancer Paternal Grandfather     Allergies  Allergen Reactions  . Depakote [Divalproex Sodium] Other (See Comments)    Hair loss  . Seroquel [Quetiapine Fumarate] Other (See Comments)    irritable  . Azithromycin Rash  . Penicillins Rash  . Pregabalin Palpitations    Manic symptoms, no sleep x's 3 days, no appetite.  . Sulfa Antibiotics Rash    Current Outpatient Prescriptions on File Prior to Visit  Medication Sig Dispense Refill  . Cholecalciferol (VITAMIN D3) 1000 UNITS CAPS Take 3,000 Units by mouth daily.     . clonazePAM (KLONOPIN) 1  MG tablet TAKE ONE TABLET TWICE DAILY FOR ANXIETY 60 tablet 0  . fexofenadine (ALLEGRA) 180 MG tablet Take 180 mg by mouth daily.    Marland Kitchen gabapentin (NEURONTIN) 600 MG tablet Take 1 tablet (600 mg total) by mouth 3 (three) times daily. 90 tablet 6  . medroxyPROGESTERone (DEPO-PROVERA) 150 MG/ML injection Inject 150 mg into the muscle every 3 (three) months.    . promethazine (PHENERGAN) 25 MG tablet Take 1 tablet (25 mg total) by mouth every 8 (eight) hours as needed for nausea or vomiting. 15 tablet 0  . ranitidine (ZANTAC) 150 MG tablet Take 150 mg by mouth daily.    . SUMAtriptan (IMITREX) 100 MG tablet Take 1 tablet (100 mg total) by mouth every 2 (two) hours as needed for migraine or headache. May repeat in 2 hours if headache persists or recurs. 10 tablet 0  . tiZANidine (ZANAFLEX) 4  MG tablet TAKE ONE TABLET EVERY EIGHT (8) HOURS ASNEEDED FOR MUSCLE SPASMS 30 tablet 5  . vitamin B-12 (CYANOCOBALAMIN) 1000 MCG tablet Take 1,000 mcg by mouth daily.    Marland Kitchen lithium carbonate (LITHOBID) 300 MG CR tablet Take 300 mg by mouth 3 (three) times daily.     Marland Kitchen oxymorphone (OPANA) 10 MG tablet Take 10 mg by mouth every 8 (eight) hours as needed for pain.      No current facility-administered medications on file prior to visit.    BP 118/82 mmHg  Pulse 88  Temp(Src) 98.3 F (36.8 C) (Oral)  Ht 5\' 9"  (1.753 m)  Wt 175 lb 12.8 oz (79.742 kg)  BMI 25.95 kg/m2  SpO2 95%    Objective:   Physical Exam  Constitutional: She is oriented to person, place, and time.  Appears to be in moderate pain. She is shielding her eyes from the light in the room.  Eyes: EOM are normal. Pupils are equal, round, and reactive to light.  Neck: Neck supple.  Cardiovascular: Normal rate and regular rhythm.   Pulmonary/Chest: Effort normal and breath sounds normal.  Lymphadenopathy:    She has no cervical adenopathy.  Neurological: She is alert and oriented to person, place, and time. She has normal reflexes. No cranial nerve deficit. Coordination normal.  Skin: Skin is warm and dry.  Psychiatric:  Mood is appropriate for circumstances.          Assessment & Plan:

## 2014-10-16 NOTE — Patient Instructions (Signed)
You've been provided with a shot of Toradol 60 mg today for your migraine. This should start to work in your body within 15-30 minutes.  You may take an Imitrex tablet this afternoon, repeat in 2 hours if you're not feeling better.  If your migraine persists after the shot in the office and the 2 doses of imitrex, you will need to go to the emergency department. Make a follow up appointment with Dr. Deborra Medina for management of your migraines. It was nice to meet you. I hope you feel better soon!  Migraine Headache A migraine headache is an intense, throbbing pain on one or both sides of your head. A migraine can last for 30 minutes to several hours. CAUSES  The exact cause of a migraine headache is not always known. However, a migraine may be caused when nerves in the brain become irritated and release chemicals that cause inflammation. This causes pain. Certain things may also trigger migraines, such as:  Alcohol.  Smoking.  Stress.  Menstruation.  Aged cheeses.  Foods or drinks that contain nitrates, glutamate, aspartame, or tyramine.  Lack of sleep.  Chocolate.  Caffeine.  Hunger.  Physical exertion.  Fatigue.  Medicines used to treat chest pain (nitroglycerine), birth control pills, estrogen, and some blood pressure medicines. SIGNS AND SYMPTOMS  Pain on one or both sides of your head.  Pulsating or throbbing pain.  Severe pain that prevents daily activities.  Pain that is aggravated by any physical activity.  Nausea, vomiting, or both.  Dizziness.  Pain with exposure to bright lights, loud noises, or activity.  General sensitivity to bright lights, loud noises, or smells. Before you get a migraine, you may get warning signs that a migraine is coming (aura). An aura may include:  Seeing flashing lights.  Seeing bright spots, halos, or zigzag lines.  Having tunnel vision or blurred vision.  Having feelings of numbness or tingling.  Having trouble  talking.  Having muscle weakness. DIAGNOSIS  A migraine headache is often diagnosed based on:  Symptoms.  Physical exam.  A CT scan or MRI of your head. These imaging tests cannot diagnose migraines, but they can help rule out other causes of headaches. TREATMENT Medicines may be given for pain and nausea. Medicines can also be given to help prevent recurrent migraines.  HOME CARE INSTRUCTIONS  Only take over-the-counter or prescription medicines for pain or discomfort as directed by your health care provider. The use of long-term narcotics is not recommended.  Lie down in a dark, quiet room when you have a migraine.  Keep a journal to find out what may trigger your migraine headaches. For example, write down:  What you eat and drink.  How much sleep you get.  Any change to your diet or medicines.  Limit alcohol consumption.  Quit smoking if you smoke.  Get 7-9 hours of sleep, or as recommended by your health care provider.  Limit stress.  Keep lights dim if bright lights bother you and make your migraines worse. SEEK IMMEDIATE MEDICAL CARE IF:   Your migraine becomes severe.  You have a fever.  You have a stiff neck.  You have vision loss.  You have muscular weakness or loss of muscle control.  You start losing your balance or have trouble walking.  You feel faint or pass out.  You have severe symptoms that are different from your first symptoms. MAKE SURE YOU:   Understand these instructions.  Will watch your condition.  Will get help  right away if you are not doing well or get worse. Document Released: 06/07/2005 Document Revised: 10/22/2013 Document Reviewed: 02/12/2013 [facility] Patient Information 2015 [place], [place]. This information is not intended to replace advice given to you by your health care provider. Make sure you discuss any questions you have with your health care provider.

## 2014-10-16 NOTE — Progress Notes (Signed)
Pre visit review using our clinic review tool, if applicable. No additional management support is needed unless otherwise documented below in the visit note. 

## 2014-10-16 NOTE — Assessment & Plan Note (Addendum)
Migraine present for 6 days with out relief from RX and OTC meds. +photoohobia and phonophobia. Currently not on preventative medication.  Neuro exam unremarkable. Toradol 60 mg IM provided in office today. Instructed patient to go home and take 1 Imitrex. Repeat in 2 hours if no relief. If no relief from that she will need to go to the emergency department. She verbalized understanding.

## 2014-10-21 ENCOUNTER — Encounter (HOSPITAL_COMMUNITY): Payer: Self-pay | Admitting: Emergency Medicine

## 2014-10-21 ENCOUNTER — Emergency Department (HOSPITAL_COMMUNITY): Payer: PPO

## 2014-10-21 ENCOUNTER — Emergency Department (HOSPITAL_COMMUNITY)
Admission: EM | Admit: 2014-10-21 | Discharge: 2014-10-21 | Disposition: A | Discharge status: Home / Self Care | Payer: PPO | Attending: Emergency Medicine | Admitting: Emergency Medicine

## 2014-10-21 ENCOUNTER — Encounter: Payer: Self-pay | Admitting: Family Medicine

## 2014-10-21 ENCOUNTER — Ambulatory Visit (INDEPENDENT_AMBULATORY_CARE_PROVIDER_SITE_OTHER): Payer: PPO | Admitting: Family Medicine

## 2014-10-21 VITALS — BP 114/68 | HR 101 | Temp 98.1°F | Wt 177.2 lb

## 2014-10-21 DIAGNOSIS — W19XXXA Unspecified fall, initial encounter: Secondary | ICD-10-CM

## 2014-10-21 DIAGNOSIS — Z8679 Personal history of other diseases of the circulatory system: Secondary | ICD-10-CM | POA: Insufficient documentation

## 2014-10-21 DIAGNOSIS — L708 Other acne: Secondary | ICD-10-CM | POA: Diagnosis not present

## 2014-10-21 DIAGNOSIS — M797 Fibromyalgia: Secondary | ICD-10-CM | POA: Insufficient documentation

## 2014-10-21 DIAGNOSIS — Y998 Other external cause status: Secondary | ICD-10-CM | POA: Diagnosis not present

## 2014-10-21 DIAGNOSIS — G43801 Other migraine, not intractable, with status migrainosus: Secondary | ICD-10-CM | POA: Diagnosis not present

## 2014-10-21 DIAGNOSIS — Z79899 Other long term (current) drug therapy: Secondary | ICD-10-CM | POA: Diagnosis not present

## 2014-10-21 DIAGNOSIS — M179 Osteoarthritis of knee, unspecified: Secondary | ICD-10-CM | POA: Diagnosis not present

## 2014-10-21 DIAGNOSIS — R55 Syncope and collapse: Secondary | ICD-10-CM | POA: Insufficient documentation

## 2014-10-21 DIAGNOSIS — L709 Acne, unspecified: Secondary | ICD-10-CM | POA: Insufficient documentation

## 2014-10-21 DIAGNOSIS — F410 Panic disorder [episodic paroxysmal anxiety] without agoraphobia: Secondary | ICD-10-CM | POA: Diagnosis not present

## 2014-10-21 DIAGNOSIS — Z3202 Encounter for pregnancy test, result negative: Secondary | ICD-10-CM | POA: Insufficient documentation

## 2014-10-21 DIAGNOSIS — Y9289 Other specified places as the place of occurrence of the external cause: Secondary | ICD-10-CM | POA: Diagnosis not present

## 2014-10-21 DIAGNOSIS — W1839XA Other fall on same level, initial encounter: Secondary | ICD-10-CM | POA: Diagnosis not present

## 2014-10-21 DIAGNOSIS — I951 Orthostatic hypotension: Secondary | ICD-10-CM

## 2014-10-21 DIAGNOSIS — Y92481 Parking lot as the place of occurrence of the external cause: Secondary | ICD-10-CM | POA: Diagnosis not present

## 2014-10-21 LAB — CBC WITH DIFFERENTIAL/PLATELET
BASOS ABS: 0.1 10*3/uL (ref 0.0–0.1)
BASOS PCT: 1 % (ref 0–1)
EOS ABS: 0.2 10*3/uL (ref 0.0–0.7)
Eosinophils Relative: 2 % (ref 0–5)
HCT: 39.1 % (ref 36.0–46.0)
HEMOGLOBIN: 12.8 g/dL (ref 12.0–15.0)
LYMPHS ABS: 2.2 10*3/uL (ref 0.7–4.0)
Lymphocytes Relative: 27 % (ref 12–46)
MCH: 27.4 pg (ref 26.0–34.0)
MCHC: 32.7 g/dL (ref 30.0–36.0)
MCV: 83.5 fL (ref 78.0–100.0)
Monocytes Absolute: 0.6 10*3/uL (ref 0.1–1.0)
Monocytes Relative: 7 % (ref 3–12)
NEUTROS ABS: 5.1 10*3/uL (ref 1.7–7.7)
NEUTROS PCT: 63 % (ref 43–77)
Platelets: 212 10*3/uL (ref 150–400)
RBC: 4.68 MIL/uL (ref 3.87–5.11)
RDW: 13.6 % (ref 11.5–15.5)
WBC: 8.2 10*3/uL (ref 4.0–10.5)

## 2014-10-21 LAB — COMPREHENSIVE METABOLIC PANEL
ALBUMIN: 3.2 g/dL — AB (ref 3.5–5.0)
ALT: 18 U/L (ref 14–54)
AST: 28 U/L (ref 15–41)
Alkaline Phosphatase: 50 U/L (ref 38–126)
Anion gap: 7 (ref 5–15)
BUN: 9 mg/dL (ref 6–20)
CALCIUM: 8.6 mg/dL — AB (ref 8.9–10.3)
CHLORIDE: 111 mmol/L (ref 101–111)
CO2: 23 mmol/L (ref 22–32)
Creatinine, Ser: 0.96 mg/dL (ref 0.44–1.00)
GFR calc Af Amer: >60 mL/min (ref >60)
Glucose, Bld: 89 mg/dL (ref 70–99)
Potassium: 4.7 mmol/L (ref 3.5–5.1)
SODIUM: 141 mmol/L (ref 135–145)
Total Bilirubin: 0.8 mg/dL (ref 0.3–1.2)
Total Protein: 5.2 g/dL — ABNORMAL LOW (ref 6.5–8.1)

## 2014-10-21 LAB — URINALYSIS, ROUTINE W REFLEX MICROSCOPIC
Bilirubin Urine: NEGATIVE
GLUCOSE, UA: NEGATIVE mg/dL
HGB URINE DIPSTICK: NEGATIVE
Ketones, ur: NEGATIVE mg/dL
Leukocytes, UA: NEGATIVE
Nitrite: NEGATIVE
PROTEIN: NEGATIVE mg/dL
Specific Gravity, Urine: 1.003 — ABNORMAL LOW (ref 1.005–1.030)
UROBILINOGEN UA: 0.2 mg/dL (ref 0.0–1.0)
pH: 6 (ref 5.0–8.0)

## 2014-10-21 LAB — TROPONIN I: Troponin I: <0.03 ng/mL (ref <0.031)

## 2014-10-21 LAB — PREGNANCY, URINE: Preg Test, Ur: NEGATIVE

## 2014-10-21 MED ORDER — AMITRIPTYLINE HCL 10 MG PO TABS: 10.0000 mg | ORAL_TABLET | Freq: Every day | ORAL | Status: DC

## 2014-10-21 MED ORDER — METOCLOPRAMIDE HCL 5 MG/ML IJ SOLN
10.0000 mg | Freq: Once | INTRAMUSCULAR | Status: AC
  Administered 2014-10-21: 10 mg via INTRAVENOUS
  Filled 2014-10-21: qty 2

## 2014-10-21 MED ORDER — SODIUM CHLORIDE 0.9 % IV BOLUS (SEPSIS)
1000.0000 mL | Freq: Once | INTRAVENOUS | Status: AC
  Administered 2014-10-21: 1000 mL via INTRAVENOUS

## 2014-10-21 MED ORDER — KETOROLAC TROMETHAMINE 30 MG/ML IJ SOLN
30.0000 mg | Freq: Once | INTRAMUSCULAR | Status: AC
  Administered 2014-10-21: 30 mg via INTRAVENOUS
  Filled 2014-10-21: qty 1

## 2014-10-21 MED ORDER — TRETINOIN MICROSPHERE 0.04 % EX GEL: Freq: Every day | CUTANEOUS | Status: DC

## 2014-10-21 MED ORDER — DIPHENHYDRAMINE HCL 50 MG/ML IJ SOLN
25.0000 mg | Freq: Once | INTRAMUSCULAR | Status: AC
  Administered 2014-10-21: 25 mg via INTRAVENOUS
  Filled 2014-10-21: qty 1

## 2014-10-21 MED ORDER — SODIUM CHLORIDE 0.9 % IV BOLUS (SEPSIS): 1000.0000 mL | Freq: Once | INTRAVENOUS | Status: DC

## 2014-10-21 NOTE — Progress Notes (Signed)
Pre visit review using our clinic review tool, if applicable. No additional management support is needed unless otherwise documented below in the visit note. 

## 2014-10-21 NOTE — Assessment & Plan Note (Signed)
Deteriorated. Discussed tx options- add topical retinoid. Call or return to clinic prn if these symptoms worsen or fail to improve as anticipated. The patient indicates understanding of these issues and agrees with the plan.

## 2014-10-21 NOTE — ED Provider Notes (Signed)
CSN: [id number]     Arrival date & time 10/21/14  1303 History   First MD Initiated Contact with Patient 10/21/14 1304     Chief Complaint  Patient presents with  . Loss of Consciousness     (Consider location/radiation/quality/duration/timing/severity/associated sxs/prior Treatment) HPI Comments: Patient presents from PCPs office with loss of consciousness. She was being seen for chronic migraine headaches and was leaving and had a syncopal episode in the parking lot. Her husband witnessed this and caught her. Denies head trauma. She reports being lightheaded and dizzy and knew that she was going to pass out. Denies chest pain or shortness of breath. Denies abdominal pain, nausea or vomiting. No focal weakness, numbness or tingling. No bowel or bladder problems. Patient with history of pots syndrome status post extensive workup in the past including cardiac Holter monitor, echocardiogram, tilt table test. She reports the syncopal episode is similar to previous. Denies any changes other medications. Denies any possibly pregnancy. Endorses normal PO intake today.  The history is provided by the patient.    Past Medical History  Diagnosis Date  . Bipolar disorder   . Fibromyalgia     on disability  . Migraine   . Panic attack   . Muscle spasm   . Uterine perforation     hx of  . Syncope     recurrent  . POTS (postural orthostatic tachycardia syndrome)   . Pain of right side of body     chronic  . Arthritis    Past Surgical History  Procedure Laterality Date  . Dilation and curettage of uterus    . Cataract extraction  dec 2014  . Lasik  dec 2014   Family History  Problem Relation Age of Onset  . Diabetes Mother   . Hypertension Mother   . Cancer Father     brain tumor  . Cancer Maternal Grandmother   . Cancer Maternal Grandfather   . Cancer Paternal Grandmother   . Cancer Paternal Grandfather    History  Substance Use Topics  . Smoking status: Never Smoker   .  Smokeless tobacco: Never Used  . Alcohol Use: No     Comment: 1 holiday drink yearly   OB History    No data available     Review of Systems  Constitutional: Negative for fever, activity change and appetite change.  HENT: Negative for congestion and rhinorrhea.   Eyes: Negative for visual disturbance.  Respiratory: Negative for cough, chest tightness and shortness of breath.   Cardiovascular: Positive for syncope. Negative for chest pain.  Gastrointestinal: Negative for nausea, vomiting and abdominal pain.  Genitourinary: Negative for dysuria, hematuria, vaginal bleeding and vaginal discharge.  Musculoskeletal: Negative for arthralgias.  Neurological: Positive for dizziness, weakness and light-headedness. Negative for headaches.  A complete 10 system review of systems was obtained and all systems are negative except as noted in the HPI and PMH.      Allergies  Depakote; Seroquel; Azithromycin; Penicillins; Pregabalin; and Sulfa antibiotics  Home Medications   Prior to Admission medications   Medication Sig Start Date End Date Taking? Authorizing Provider  amitriptyline (ELAVIL) 10 MG tablet Take 1 tablet (10 mg total) by mouth at bedtime. 10/21/14  Yes Lucille Passy, MD  Cholecalciferol (VITAMIN D3) 1000 UNITS CAPS Take 3,000 Units by mouth daily.    Yes Historical Provider, MD  clonazePAM (KLONOPIN) 1 MG tablet TAKE ONE TABLET TWICE DAILY FOR ANXIETY 09/09/14  Yes Lucille Passy, MD  fexofenadine (  ALLEGRA) 180 MG tablet Take 180 mg by mouth daily.   Yes Historical Provider, MD  FLUoxetine (PROZAC) 10 MG capsule Take 10 mg by mouth daily.   Yes Historical Provider, MD  gabapentin (NEURONTIN) 600 MG tablet Take 1 tablet (600 mg total) by mouth 3 (three) times daily. 02/12/14  Yes Drema Dallas, DO  medroxyPROGESTERone (DEPO-PROVERA) 150 MG/ML injection Inject 150 mg into the muscle every 3 (three) months.   Yes Historical Provider, MD  promethazine (PHENERGAN) 25 MG tablet Take 1 tablet  (25 mg total) by mouth every 8 (eight) hours as needed for nausea or vomiting. 09/25/13  Yes Christopher Lawyer, PA-C  ranitidine (ZANTAC) 150 MG tablet Take 150 mg by mouth daily.   Yes Historical Provider, MD  SUMAtriptan (IMITREX) 100 MG tablet Take 1 tablet (100 mg total) by mouth every 2 (two) hours as needed for migraine or headache. May repeat in 2 hours if headache persists or recurs. 02/07/14  Yes Jearld Fenton, NP  tiZANidine (ZANAFLEX) 4 MG tablet TAKE ONE TABLET EVERY EIGHT (8) HOURS ASNEEDED FOR MUSCLE SPASMS 09/02/14  Yes Lucille Passy, MD  tretinoin microspheres (RETIN-A MICRO) 0.04 % gel Apply topically at bedtime. 10/21/14  Yes Lucille Passy, MD  vitamin B-12 (CYANOCOBALAMIN) 1000 MCG tablet Take 1,000 mcg by mouth daily.   Yes Historical Provider, MD   BP 115/59 mmHg  Pulse 91  Temp(Src) 98.1 F (36.7 C) (Oral)  Resp 18  SpO2 95% Physical Exam  Constitutional: She is oriented to person, place, and time. She appears well-developed and well-nourished. No distress.  HENT:  Head: Normocephalic and atraumatic.  Mouth/Throat: Oropharynx is clear and moist. No oropharyngeal exudate.  Eyes: Conjunctivae and EOM are normal. Pupils are equal, round, and reactive to light.  Neck: Normal range of motion. Neck supple.  No C spine tenderness  Cardiovascular: Normal rate, regular rhythm, normal heart sounds and intact distal pulses.   No murmur heard. Pulmonary/Chest: Effort normal and breath sounds normal. No respiratory distress.  Abdominal: Soft. There is no tenderness. There is no rebound and no guarding.  Musculoskeletal: Normal range of motion. She exhibits no edema or tenderness.  Neurological: She is alert and oriented to person, place, and time. No cranial nerve deficit. She exhibits normal muscle tone. Coordination normal.  No ataxia on finger to nose bilaterally. No pronator drift. 5/5 strength throughout. CN 2-12 intact. Negative Romberg. Equal grip strength. Sensation intact. Gait  is normal.   Skin: Skin is warm.  Psychiatric: She has a normal mood and affect. Her behavior is normal.  Nursing note and vitals reviewed.   ED Course  Procedures (including critical care time) Labs Review Labs Reviewed  COMPREHENSIVE METABOLIC PANEL - Abnormal; Notable for the following:    Calcium 8.6 (*)    Total Protein 5.2 (*)    Albumin 3.2 (*)    All other components within normal limits  URINALYSIS, ROUTINE W REFLEX MICROSCOPIC - Abnormal; Notable for the following:    Specific Gravity, Urine 1.003 (*)    All other components within normal limits  CBC WITH DIFFERENTIAL/PLATELET  TROPONIN I  PREGNANCY, URINE    Imaging Review Ct Head Wo Contrast  10/21/2014   CLINICAL DATA:  Fall after episode of syncope. One week history of migraine headaches  EXAM: CT HEAD WITHOUT CONTRAST  CT CERVICAL SPINE WITHOUT CONTRAST  TECHNIQUE: Multidetector CT imaging of the head and cervical spine was performed following the standard protocol without intravenous contrast. Multiplanar CT  image reconstructions of the cervical spine were also generated.  COMPARISON:  Head CT October 03, 2013; brain MRI May 01, 2014  FINDINGS: CT HEAD FINDINGS  The ventricles are normal in size and configuration. There is no mass, hemorrhage, extra-axial fluid collection, or midline shift. Gray-white compartments are normal. No acute infarct apparent. The bony calvarium appears intact. The mastoid air cells are clear.  CT CERVICAL SPINE FINDINGS  There is no fracture or spondylolisthesis. Prevertebral soft tissues and predental space regions are normal. Disc spaces appear intact. There are anterior osteophytes at C5 and C6. There is calcification in the anterior ligament at C5-6. There is no nerve root edema or effacement. No disc extrusion or stenosis.  IMPRESSION: CT head:  Study within normal limits.  CT cervical spine: Slight osteoarthritic changes C5-6. No fracture or spondylolisthesis.   Electronically Signed   By:  Lowella Grip III M.D.   On: 10/21/2014 14:47   Ct Cervical Spine Wo Contrast  10/21/2014   CLINICAL DATA:  Fall after episode of syncope. One week history of migraine headaches  EXAM: CT HEAD WITHOUT CONTRAST  CT CERVICAL SPINE WITHOUT CONTRAST  TECHNIQUE: Multidetector CT imaging of the head and cervical spine was performed following the standard protocol without intravenous contrast. Multiplanar CT image reconstructions of the cervical spine were also generated.  COMPARISON:  Head CT October 03, 2013; brain MRI May 01, 2014  FINDINGS: CT HEAD FINDINGS  The ventricles are normal in size and configuration. There is no mass, hemorrhage, extra-axial fluid collection, or midline shift. Gray-white compartments are normal. No acute infarct apparent. The bony calvarium appears intact. The mastoid air cells are clear.  CT CERVICAL SPINE FINDINGS  There is no fracture or spondylolisthesis. Prevertebral soft tissues and predental space regions are normal. Disc spaces appear intact. There are anterior osteophytes at C5 and C6. There is calcification in the anterior ligament at C5-6. There is no nerve root edema or effacement. No disc extrusion or stenosis.  IMPRESSION: CT head:  Study within normal limits.  CT cervical spine: Slight osteoarthritic changes C5-6. No fracture or spondylolisthesis.   Electronically Signed   By: Lowella Grip III M.D.   On: 10/21/2014 14:47     EKG Interpretation   Date/Time:  Monday Oct 21 2014 13:11:49 EDT Ventricular Rate:  85 PR Interval:  153 QRS Duration: 76 QT Interval:  391 QTC Calculation: 465 R Axis:   51 Text Interpretation:  Sinus rhythm Probable left atrial enlargement Low  voltage, precordial leads No significant change was found Confirmed by  Wyvonnia Dusky  MD, Ronte Parker [phone]) on 10/21/2014 1:17:13 PM      MDM   Final diagnoses:  Fall  Orthostatic hypotension  Syncope, unspecified syncope type   Syncopal episode with dizziness prodrome.  Hx POTS and  this episode is similar.  Denies focal deficits.  EKG unchanged.  Orthostatics positive.  HR elevates with standing to 111.  IVF given.  Patient tolerating by mouth. Heart rate has improved after IV fluids. Labs unremarkable.  Patient's had extensive workup for syncope in the past and this is consistent with her previous episodes of pots. She feels back to baseline and is ambulatory and tolerating by mouth. She appears safe for discharge.   Ezequiel Essex, MD 10/21/14 [phone]

## 2014-10-21 NOTE — Progress Notes (Signed)
Subjective:   Patient ID: Melissa [name], female    DOB: 1970-11-04, 44 y.o.   MRN: [id number]  Melissa [name] is a pleasant 44 y.o. year old female who presents to clinic today with Headache  on 10/21/2014  HPI:  Migraines- h/o migraines for years. Was on topamax which was working well but weaned herself off of it in January 2016 since it was making her feel "foggy headed." Last week, developed a typical migraine- unilateral, associated with nausea and phonophobia. No vomiting.  Imitrex helped a little but migraine seems to keep coming back.   Has not had one in months.  Acne- acne on back not improving despite using topical benzoyl peroxide and husband's clindagel. Now becoming painful.  Current Outpatient Prescriptions on File Prior to Visit  Medication Sig Dispense Refill  . Cholecalciferol (VITAMIN D3) 1000 UNITS CAPS Take 3,000 Units by mouth daily.     . clonazePAM (KLONOPIN) 1 MG tablet TAKE ONE TABLET TWICE DAILY FOR ANXIETY 60 tablet 0  . fexofenadine (ALLEGRA) 180 MG tablet Take 180 mg by mouth daily.    Marland Kitchen gabapentin (NEURONTIN) 600 MG tablet Take 1 tablet (600 mg total) by mouth 3 (three) times daily. 90 tablet 6  . medroxyPROGESTERone (DEPO-PROVERA) 150 MG/ML injection Inject 150 mg into the muscle every 3 (three) months.    . promethazine (PHENERGAN) 25 MG tablet Take 1 tablet (25 mg total) by mouth every 8 (eight) hours as needed for nausea or vomiting. 15 tablet 0  . ranitidine (ZANTAC) 150 MG tablet Take 150 mg by mouth daily.    . SUMAtriptan (IMITREX) 100 MG tablet Take 1 tablet (100 mg total) by mouth every 2 (two) hours as needed for migraine or headache. May repeat in 2 hours if headache persists or recurs. 10 tablet 0  . tiZANidine (ZANAFLEX) 4 MG tablet TAKE ONE TABLET EVERY EIGHT (8) HOURS ASNEEDED FOR MUSCLE SPASMS 30 tablet 5  . vitamin B-12 (CYANOCOBALAMIN) 1000 MCG tablet Take 1,000 mcg by mouth daily.     No current facility-administered medications on file  prior to visit.    Allergies  Allergen Reactions  . Depakote [Divalproex Sodium] Other (See Comments)    Hair loss  . Seroquel [Quetiapine Fumarate] Other (See Comments)    irritable  . Azithromycin Rash  . Penicillins Rash  . Pregabalin Palpitations    Manic symptoms, no sleep x's 3 days, no appetite.  . Sulfa Antibiotics Rash    Past Medical History  Diagnosis Date  . Bipolar disorder   . Fibromyalgia     on disability  . Migraine   . Panic attack   . Muscle spasm   . Uterine perforation     hx of  . Syncope     recurrent  . POTS (postural orthostatic tachycardia syndrome)   . Pain of right side of body     chronic  . Arthritis     Past Surgical History  Procedure Laterality Date  . Dilation and curettage of uterus    . Cataract extraction  dec 2014  . Lasik  dec 2014    Family History  Problem Relation Age of Onset  . Diabetes Mother   . Hypertension Mother   . Cancer Father     brain tumor  . Cancer Maternal Grandmother   . Cancer Maternal Grandfather   . Cancer Paternal Grandmother   . Cancer Paternal Grandfather     History   Social History  . Marital  Status: Married    Spouse Name: Melissa [name]  . Number of Children: 0  . Years of Education: HS/college   Occupational History  .  Other    disabled   Social History Main Topics  . Smoking status: Never Smoker   . Smokeless tobacco: Never Used  . Alcohol Use: No     Comment: 1 holiday drink yearly  . Drug Use: No  . Sexual Activity: Not on file   Other Topics Concern  . Not on file   Social History Narrative   Patient is married Melissa [name]) and lives at home with her husband.   Married 20+ years   From [place]   On disability for fibromyalgia   Caffeine Use: 1 soda daily   The PMH, PSH, Social History, Family History, Medications, and allergies have been reviewed in [facility], and have been updated if relevant.   Review of Systems  Constitutional: Negative.   Respiratory: Negative.     Neurological: Positive for headaches. Negative for dizziness, tremors, seizures, syncope, facial asymmetry, speech difficulty, weakness, light-headedness and numbness.  Psychiatric/Behavioral: Negative.   All other systems reviewed and are negative.      Objective:    BP 114/68 mmHg  Pulse 101  Temp(Src) 98.1 F (36.7 C) (Oral)  Wt 177 lb 4 oz (80.4 kg)  SpO2 97%   Physical Exam  Constitutional: She appears well-developed and well-nourished. No distress.  HENT:  Head: Normocephalic.  Eyes: Conjunctivae are normal.  Neck: Normal range of motion.  Cardiovascular: Normal rate.   Pulmonary/Chest: Effort normal.  Musculoskeletal: She exhibits no edema.  Skin:     Psychiatric: She has a normal mood and affect. Her behavior is normal. Judgment and thought content normal.  Nursing note and vitals reviewed.         Assessment & Plan:   Other migraine with status migrainosus, not intractable  Other acne No Follow-up on file.

## 2014-10-21 NOTE — Assessment & Plan Note (Signed)
Deteriorated. Discussed prophylactic treatment options- agrees to start elavil 10 mg qhs after we discussed risks and possible side effects. She will call me in a few weeks with an udpate.

## 2014-10-21 NOTE — Discharge Instructions (Signed)
Orthostatic Hypotension Keep herself hydrated and follow up with your doctor. Return to the ED if you develop new or worsening symptoms. Orthostatic hypotension is a sudden drop in blood pressure. It happens when you quickly stand up from a seated or lying position. You may feel dizzy or light-headed. This can last for just a few seconds or for up to a few minutes. It is usually not a serious problem. However, if this happens frequently or gets worse, it can be a sign of something more serious. CAUSES  Different things can cause orthostatic hypotension, including:   Loss of body fluids (dehydration).  Medicines that lower blood pressure.  Sudden changes in posture, such as standing up quickly after you have been sitting or lying down.  Taking too much of your medicine. SIGNS AND SYMPTOMS   Light-headedness or dizziness.   Fainting or near-fainting.   A fast heart rate.   Weakness.   Feeling tired (fatigue).  DIAGNOSIS  Your health care provider may do several things to help diagnose your condition and identify the cause. These may include:   Taking a medical history and doing a physical exam.  Checking your blood pressure. Your health care provider will check your blood pressure when you are:  Lying down.  Sitting.  Standing.  Using tilt table testing. In this test, you lie down on a table that moves from a lying position to a standing position. You will be strapped onto the table. This test monitors your blood pressure and heart rate when you are in different positions. TREATMENT  Treatment will vary depending on the cause. Possible treatments include:   Changing the dosage of your medicines.  Wearing compression stockings on your lower legs.  Standing up slowly after sitting or lying down.  Eating more salt.  Eating frequent, small meals.  In some cases, getting IV fluids.  Taking medicine to enhance fluid retention. HOME CARE INSTRUCTIONS  Only take  over-the-counter or prescription medicines as directed by your health care provider.  Follow your health care provider's instructions for changing the dosage of your current medicines.  Do not stop or adjust your medicine on your own.  Stand up slowly after sitting or lying down. This allows your body to adjust to the different position.  Wear compression stockings as directed.  Eat extra salt as directed.  Do not add extra salt to your diet unless directed to by your health care provider.  Eat frequent, small meals.  Avoid standing suddenly after eating.  Avoid hot showers or excessive heat as directed by your health care provider.  Keep all follow-up appointments. SEEK MEDICAL CARE IF:  You continue to feel dizzy or light-headed after standing.  You feel groggy or confused.  You feel cold, clammy, or sick to your stomach (nauseous).  You have blurred vision.  You feel short of breath. SEEK IMMEDIATE MEDICAL CARE IF:   You faint after standing.  You have chest pain.  You have difficulty breathing.   You lose feeling or movement in your arms or legs.   You have slurred speech or difficulty talking, or you are unable to talk.  MAKE SURE YOU:   Understand these instructions.  Will watch your condition.  Will get help right away if you are not doing well or get worse. Document Released: 05/28/2002 Document Revised: 06/12/2013 Document Reviewed: 03/30/2013 [facility] Patient Information 2015 [place], [place]. This information is not intended to replace advice given to you by your health care provider. Make  sure you discuss any questions you have with your health care provider.

## 2014-10-21 NOTE — ED Notes (Signed)
Pt returned from CT °

## 2014-10-21 NOTE — ED Notes (Addendum)
Per EMS pt was seen at PCP for migraine that she has had for one week. Pt was leaving PCP and had a syncopal episode. Pt has hx of [place]. EMS vitals 112/70, 86HR, CBG 77 Pt received 858ml NACL en route. Pt sts she did not hit head because of husband catching fall

## 2014-10-21 NOTE — ED Notes (Signed)
Pt just given IV meds and will be ready for discharge at 1705.

## 2014-10-28 ENCOUNTER — Other Ambulatory Visit: Payer: Self-pay | Admitting: Family Medicine

## 2014-10-28 NOTE — Telephone Encounter (Signed)
Received refill request electronically Last refill 02/13/15 #90/6, last office visit 5/216/acute Is it okay to refill medication?

## 2014-10-28 NOTE — Telephone Encounter (Signed)
Pt left v/m requesting cb for status of gabapentin refill; advised refill already done pt voiced understanding and will ck with pharmacy for pick up.

## 2014-11-08 ENCOUNTER — Ambulatory Visit (INDEPENDENT_AMBULATORY_CARE_PROVIDER_SITE_OTHER): Payer: PPO | Admitting: Primary Care

## 2014-11-08 ENCOUNTER — Encounter: Payer: Self-pay | Admitting: Primary Care

## 2014-11-08 VITALS — BP 116/70 | HR 98 | Temp 97.9°F | Ht 69.0 in | Wt 178.0 lb

## 2014-11-08 DIAGNOSIS — R350 Frequency of micturition: Secondary | ICD-10-CM

## 2014-11-08 LAB — POCT URINALYSIS DIPSTICK
Bilirubin, UA: NEGATIVE
Blood, UA: NEGATIVE
GLUCOSE UA: NEGATIVE
Ketones, UA: NEGATIVE
LEUKOCYTES UA: NEGATIVE
NITRITE UA: NEGATIVE
Protein, UA: NEGATIVE
Spec Grav, UA: >=1.03
UROBILINOGEN UA: NEGATIVE
pH, UA: 6

## 2014-11-08 NOTE — Progress Notes (Signed)
Pre visit review using our clinic review tool, if applicable. No additional management support is needed unless otherwise documented below in the visit note. 

## 2014-11-08 NOTE — Progress Notes (Signed)
Subjective:    Patient ID: Melissa [name], female    DOB: Aug 04, 1970, 44 y.o.   MRN: [id number]  HPI  Ms. Melissa [name] is a 44 year old female who presents today with a chief complaint of urinary frequency and dysuria. Her symptoms have been present since Sunday last weekend and reports worsening symptoms over the week. She denies fevers, chills, flank pain, nausea. She admits to little water consumption recently. She's not taken anything OTC for her symptoms.  Review of Systems  Constitutional: Negative for fever and chills.  Gastrointestinal: Negative for nausea.  Genitourinary: Positive for dysuria, urgency and frequency. Negative for flank pain, vaginal discharge, difficulty urinating and vaginal pain.       Past Medical History  Diagnosis Date  . Bipolar disorder   . Fibromyalgia     on disability  . Migraine   . Panic attack   . Muscle spasm   . Uterine perforation     hx of  . Syncope     recurrent  . POTS (postural orthostatic tachycardia syndrome)   . Pain of right side of body     chronic  . Arthritis     History   Social History  . Marital Status: Married    Spouse Name: Izell [place]  . Number of Children: 0  . Years of Education: HS/college   Occupational History  .  Other    disabled   Social History Main Topics  . Smoking status: Never Smoker   . Smokeless tobacco: Never Used  . Alcohol Use: No     Comment: 1 holiday drink yearly  . Drug Use: No  . Sexual Activity: Not on file   Other Topics Concern  . Not on file   Social History Narrative   Patient is married Izell Christmas) and lives at home with her husband.   Married 20+ years   From [place]   On disability for fibromyalgia   Caffeine Use: 1 soda daily    Past Surgical History  Procedure Laterality Date  . Dilation and curettage of uterus    . Cataract extraction  dec 2014  . Lasik  dec 2014    Family History  Problem Relation Age of Onset  . Diabetes Mother   . Hypertension Mother   . Cancer  Father     brain tumor  . Cancer Maternal Grandmother   . Cancer Maternal Grandfather   . Cancer Paternal Grandmother   . Cancer Paternal Grandfather     Allergies  Allergen Reactions  . Depakote [Divalproex Sodium] Other (See Comments)    Hair loss  . Seroquel [Quetiapine Fumarate] Other (See Comments)    irritable  . Azithromycin Rash  . Penicillins Rash  . Pregabalin Palpitations    Manic symptoms, no sleep x's 3 days, no appetite.  . Sulfa Antibiotics Rash    Current Outpatient Prescriptions on File Prior to Visit  Medication Sig Dispense Refill  . amitriptyline (ELAVIL) 10 MG tablet Take 1 tablet (10 mg total) by mouth at bedtime. 30 tablet 3  . Cholecalciferol (VITAMIN D3) 1000 UNITS CAPS Take 3,000 Units by mouth daily.     . clonazePAM (KLONOPIN) 1 MG tablet TAKE ONE TABLET TWICE DAILY FOR ANXIETY 60 tablet 0  . fexofenadine (ALLEGRA) 180 MG tablet Take 180 mg by mouth daily.    Marland Kitchen FLUoxetine (PROZAC) 10 MG capsule Take 10 mg by mouth daily.    Marland Kitchen gabapentin (NEURONTIN) 600 MG tablet TAKE ONE (1) TABLET THREE (  3) TIMES EACH DAY 90 tablet 0  . medroxyPROGESTERone (DEPO-PROVERA) 150 MG/ML injection Inject 150 mg into the muscle every 3 (three) months.    . promethazine (PHENERGAN) 25 MG tablet Take 1 tablet (25 mg total) by mouth every 8 (eight) hours as needed for nausea or vomiting. 15 tablet 0  . ranitidine (ZANTAC) 150 MG tablet Take 150 mg by mouth daily.    . SUMAtriptan (IMITREX) 100 MG tablet Take 1 tablet (100 mg total) by mouth every 2 (two) hours as needed for migraine or headache. May repeat in 2 hours if headache persists or recurs. 10 tablet 0  . tiZANidine (ZANAFLEX) 4 MG tablet TAKE ONE TABLET EVERY EIGHT (8) HOURS ASNEEDED FOR MUSCLE SPASMS 30 tablet 5  . tretinoin microspheres (RETIN-A MICRO) 0.04 % gel Apply topically at bedtime. 45 g 0  . vitamin B-12 (CYANOCOBALAMIN) 1000 MCG tablet Take 1,000 mcg by mouth daily.     No current facility-administered  medications on file prior to visit.    BP 116/70 mmHg  Pulse 98  Temp(Src) 97.9 F (36.6 C) (Oral)  Ht 5\' 9"  (1.753 m)  Wt 178 lb (80.74 kg)  BMI 26.27 kg/m2  SpO2 98%     Objective:   Physical Exam  Constitutional: She is oriented to person, place, and time. She appears well-nourished. No distress.  Cardiovascular: Normal rate and regular rhythm.   Pulmonary/Chest: Effort normal and breath sounds normal.  Abdominal: Soft. Bowel sounds are normal. There is no tenderness.  Neurological: She is alert and oriented to person, place, and time.  Skin: Skin is warm and dry.          Assessment & Plan:  Urinary frequency:  Urinalysis negative for leuks, nitrites, blood, glucose. Push fluids, especially water over the weekend.  Reassurance was provided that she did not have an infection. Try AZO OTC. Cranberry juice. Call if worsening symptoms, or go to urgent care over the weekend.

## 2014-11-08 NOTE — Patient Instructions (Signed)
Your urine test did not show any infection. Ensure that you drink plenty of water over the weekend. Try AZO over the counter for any symptoms. You may try drinking a cup of cranberry juice daily for the next several days. It was nice seeing you!

## 2014-11-14 ENCOUNTER — Other Ambulatory Visit: Payer: Self-pay | Admitting: Family Medicine

## 2014-11-27 ENCOUNTER — Other Ambulatory Visit: Payer: Self-pay | Admitting: Family Medicine

## 2014-12-05 ENCOUNTER — Ambulatory Visit: Payer: PPO | Admitting: Family Medicine

## 2014-12-10 ENCOUNTER — Ambulatory Visit (INDEPENDENT_AMBULATORY_CARE_PROVIDER_SITE_OTHER): Payer: PPO | Admitting: Family Medicine

## 2014-12-10 ENCOUNTER — Encounter: Payer: Self-pay | Admitting: Family Medicine

## 2014-12-10 VITALS — BP 102/72 | HR 94 | Temp 98.2°F | Wt 175.8 lb

## 2014-12-10 DIAGNOSIS — I951 Orthostatic hypotension: Secondary | ICD-10-CM

## 2014-12-10 DIAGNOSIS — G90A Postural orthostatic tachycardia syndrome (POTS): Secondary | ICD-10-CM

## 2014-12-10 DIAGNOSIS — F314 Bipolar disorder, current episode depressed, severe, without psychotic features: Secondary | ICD-10-CM | POA: Diagnosis not present

## 2014-12-10 DIAGNOSIS — Z3042 Encounter for surveillance of injectable contraceptive: Secondary | ICD-10-CM | POA: Diagnosis not present

## 2014-12-10 DIAGNOSIS — G43801 Other migraine, not intractable, with status migrainosus: Secondary | ICD-10-CM | POA: Diagnosis not present

## 2014-12-10 DIAGNOSIS — G894 Chronic pain syndrome: Secondary | ICD-10-CM

## 2014-12-10 DIAGNOSIS — E538 Deficiency of other specified B group vitamins: Secondary | ICD-10-CM

## 2014-12-10 DIAGNOSIS — R000 Tachycardia, unspecified: Secondary | ICD-10-CM

## 2014-12-10 DIAGNOSIS — M797 Fibromyalgia: Secondary | ICD-10-CM

## 2014-12-10 MED ORDER — MEDROXYPROGESTERONE ACETATE 150 MG/ML IM SUSP
150.0000 mg | Freq: Once | INTRAMUSCULAR | Status: AC
  Administered 2014-12-10: 150 mg via INTRAMUSCULAR

## 2014-12-10 NOTE — Progress Notes (Signed)
Pre visit review using our clinic review tool, if applicable. No additional management support is needed unless otherwise documented below in the visit note. 

## 2014-12-10 NOTE — Assessment & Plan Note (Signed)
Continue OTC Vit B12 1000 mcg daily.

## 2014-12-10 NOTE — Assessment & Plan Note (Signed)
Im depo given today.

## 2014-12-10 NOTE — Assessment & Plan Note (Signed)
Not currently taking rx for this. Attempting to "stay as hydrated as possible." No recent syncopal episodes.

## 2014-12-10 NOTE — Assessment & Plan Note (Signed)
No changes made to rxs. 

## 2014-12-10 NOTE — Progress Notes (Signed)
Subjective:   Patient ID: Melissa [name], female    DOB: 01-10-1971, 44 y.o.   MRN: [id number]  Melissa [name] is a pleasant 44 y.o. year old female who presents to clinic today with Follow-up  on 12/10/2014  HPI:  Migraines- has not had another migraine since we started elavil 10 mg nightly for prophylaxis last month. She is very pleased.  Acne- improved with topical retinoid, also started last month.  Due for depo today.  Vit B12 deficiency- still taking Vit B12 1000 mcg daily.  She does feel her energy has improved somewhat.  POTs- no recent syncopal episodes.  Current Outpatient Prescriptions on File Prior to Visit  Medication Sig Dispense Refill  . amitriptyline (ELAVIL) 10 MG tablet Take 1 tablet (10 mg total) by mouth at bedtime. 30 tablet 3  . Cholecalciferol (VITAMIN D3) 1000 UNITS CAPS Take 3,000 Units by mouth daily.     . clonazePAM (KLONOPIN) 1 MG tablet TAKE ONE TABLET TWICE DAILY FOR ANXIETY 60 tablet 0  . fexofenadine (ALLEGRA) 180 MG tablet Take 180 mg by mouth daily.    Marland Kitchen FLUoxetine (PROZAC) 10 MG capsule Take 10 mg by mouth daily.    Marland Kitchen gabapentin (NEURONTIN) 600 MG tablet TAKE ONE (1) TABLET THREE (3) TIMES EACH DAY 90 tablet 5  . promethazine (PHENERGAN) 25 MG tablet Take 1 tablet (25 mg total) by mouth every 8 (eight) hours as needed for nausea or vomiting. 15 tablet 0  . ranitidine (ZANTAC) 150 MG tablet Take 150 mg by mouth daily.    . SUMAtriptan (IMITREX) 100 MG tablet Take 1 tablet (100 mg total) by mouth every 2 (two) hours as needed for migraine or headache. May repeat in 2 hours if headache persists or recurs. 10 tablet 0  . tiZANidine (ZANAFLEX) 4 MG tablet TAKE ONE TABLET EVERY EIGHT (8) HOURS ASNEEDED FOR MUSCLE SPASMS 30 tablet 5  . tretinoin microspheres (RETIN-A MICRO) 0.04 % gel APPLY TOPICALLY AT BEDTIME 45 g 2  . vitamin B-12 (CYANOCOBALAMIN) 1000 MCG tablet Take 1,000 mcg by mouth daily.     No current facility-administered medications on file  prior to visit.    Allergies  Allergen Reactions  . Depakote [Divalproex Sodium] Other (See Comments)    Hair loss  . Seroquel [Quetiapine Fumarate] Other (See Comments)    irritable  . Azithromycin Rash  . Penicillins Rash  . Pregabalin Palpitations    Manic symptoms, no sleep x's 3 days, no appetite.  . Sulfa Antibiotics Rash    Past Medical History  Diagnosis Date  . Bipolar disorder   . Fibromyalgia     on disability  . Migraine   . Panic attack   . Muscle spasm   . Uterine perforation     hx of  . Syncope     recurrent  . POTS (postural orthostatic tachycardia syndrome)   . Pain of right side of body     chronic  . Arthritis     Past Surgical History  Procedure Laterality Date  . Dilation and curettage of uterus    . Cataract extraction  dec 2014  . Lasik  dec 2014    Family History  Problem Relation Age of Onset  . Diabetes Mother   . Hypertension Mother   . Cancer Father     brain tumor  . Cancer Maternal Grandmother   . Cancer Maternal Grandfather   . Cancer Paternal Grandmother   . Cancer Paternal Grandfather  History   Social History  . Marital Status: Married    Spouse Name: Izell [place]  . Number of Children: 0  . Years of Education: HS/college   Occupational History  .  Other    disabled   Social History Main Topics  . Smoking status: Never Smoker   . Smokeless tobacco: Never Used  . Alcohol Use: No     Comment: 1 holiday drink yearly  . Drug Use: No  . Sexual Activity: Not on file   Other Topics Concern  . Not on file   Social History Narrative   Patient is married Izell [place]) and lives at home with her husband.   Married 20+ years   From [place]   On disability for fibromyalgia   Caffeine Use: 1 soda daily   The PMH, PSH, Social History, Family History, Medications, and allergies have been reviewed in [facility], and have been updated if relevant.   Review of Systems  Constitutional: Negative.   HENT: Negative.   Respiratory:  Negative.   Cardiovascular: Negative.   Gastrointestinal: Negative.   Endocrine: Negative.   Genitourinary: Negative.   Musculoskeletal: Negative.   Skin: Negative.   Allergic/Immunologic: Negative.   Neurological: Negative.   Hematological: Negative.   Psychiatric/Behavioral: Negative.   All other systems reviewed and are negative.      Objective:    BP 102/72 mmHg  Pulse 94  Temp(Src) 98.2 F (36.8 C) (Oral)  Wt 175 lb 12 oz (79.72 kg)   Physical Exam  Constitutional: She is oriented to person, place, and time. She appears well-developed and well-nourished. No distress.  HENT:  Head: Normocephalic and atraumatic.  Eyes: Conjunctivae are normal.  Neck: Normal range of motion.  Cardiovascular: Normal rate, regular rhythm and normal heart sounds.   Pulmonary/Chest: Effort normal and breath sounds normal. No respiratory distress. She has no wheezes. She has no rales. She exhibits no tenderness.  Musculoskeletal: Normal range of motion. She exhibits no edema.  Neurological: She is alert and oriented to person, place, and time. No cranial nerve deficit.  Skin: Skin is warm and [name].  Psychiatric: She has a normal mood and affect. Her behavior is normal. Judgment and thought content normal.  Nursing note and vitals reviewed.         Assessment & Plan:   Encounter for surveillance of injectable contraceptive - Plan: medroxyPROGESTERone (DEPO-PROVERA) injection 150 mg  Bipolar disorder, current episode depressed, severe, without psychotic features  POTS (postural orthostatic tachycardia syndrome)  Other migraine with status migrainosus, not intractable  Chronic pain syndrome  Vitamin B12 deficiency No Follow-up on file.

## 2014-12-10 NOTE — Assessment & Plan Note (Signed)
Improved on elavil for prophylaxis. No changes made to rxs today.

## 2014-12-25 ENCOUNTER — Ambulatory Visit (INDEPENDENT_AMBULATORY_CARE_PROVIDER_SITE_OTHER): Payer: PPO | Admitting: Internal Medicine

## 2014-12-25 ENCOUNTER — Encounter: Payer: Self-pay | Admitting: Internal Medicine

## 2014-12-25 VITALS — BP 116/74 | HR 77 | Temp 98.2°F | Wt 175.0 lb

## 2014-12-25 DIAGNOSIS — G43019 Migraine without aura, intractable, without status migrainosus: Secondary | ICD-10-CM

## 2014-12-25 MED ORDER — KETOROLAC TROMETHAMINE 60 MG/2ML IM SOLN
60.0000 mg | Freq: Once | INTRAMUSCULAR | Status: AC
  Administered 2014-12-25: 60 mg via INTRAMUSCULAR

## 2014-12-25 MED ORDER — PROMETHAZINE HCL 25 MG/ML IJ SOLN
25.0000 mg | Freq: Once | INTRAMUSCULAR | Status: AC
  Administered 2014-12-25: 25 mg via INTRAMUSCULAR

## 2014-12-25 NOTE — Progress Notes (Signed)
Pre visit review using our clinic review tool, if applicable. No additional management support is needed unless otherwise documented below in the visit note. 

## 2014-12-25 NOTE — Progress Notes (Signed)
Subjective:    Patient ID: Melissa [name], female    DOB: [birth year], 44 y.o.   MRN: [id number]  HPI  Pt presents to the clinic today with c/o a headache. She reports this started 3 days ago. It is located on the right side of her head. She describes the pain as dull and achy. The pain is constant. She does have associated nausea, sensitivity to light and sound. She reports this migraine is similar to her usual migraine. She has taken Excedrin, Benadryl, Tylenol, Percocet and Imitrex without any relief. She denies any head injury. She reports Dr. Deborra Medina switched her from Topomax to Elavil about 8 weeks ago to hopefully get better control over her migraines.  Review of Systems      Past Medical History  Diagnosis Date  . Bipolar disorder   . Fibromyalgia     on disability  . Migraine   . Panic attack   . Muscle spasm   . Uterine perforation     hx of  . Syncope     recurrent  . POTS (postural orthostatic tachycardia syndrome)   . Pain of right side of body     chronic  . Arthritis     Current Outpatient Prescriptions  Medication Sig Dispense Refill  . amitriptyline (ELAVIL) 10 MG tablet Take 1 tablet (10 mg total) by mouth at bedtime. 30 tablet 3  . Cholecalciferol (VITAMIN D3) 1000 UNITS CAPS Take 3,000 Units by mouth daily.     . clonazePAM (KLONOPIN) 1 MG tablet TAKE ONE TABLET TWICE DAILY FOR ANXIETY 60 tablet 0  . fexofenadine (ALLEGRA) 180 MG tablet Take 180 mg by mouth daily.    Marland Kitchen FLUoxetine (PROZAC) 10 MG capsule Take 10 mg by mouth daily.    Marland Kitchen gabapentin (NEURONTIN) 600 MG tablet TAKE ONE (1) TABLET THREE (3) TIMES EACH DAY 90 tablet 5  . promethazine (PHENERGAN) 25 MG tablet Take 1 tablet (25 mg total) by mouth every 8 (eight) hours as needed for nausea or vomiting. 15 tablet 0  . ranitidine (ZANTAC) 150 MG tablet Take 150 mg by mouth daily.    . SUMAtriptan (IMITREX) 100 MG tablet Take 1 tablet (100 mg total) by mouth every 2 (two) hours as needed for migraine or  headache. May repeat in 2 hours if headache persists or recurs. 10 tablet 0  . tiZANidine (ZANAFLEX) 4 MG tablet TAKE ONE TABLET EVERY EIGHT (8) HOURS ASNEEDED FOR MUSCLE SPASMS 30 tablet 5  . tretinoin microspheres (RETIN-A MICRO) 0.04 % gel APPLY TOPICALLY AT BEDTIME 45 g 2  . vitamin B-12 (CYANOCOBALAMIN) 1000 MCG tablet Take 1,000 mcg by mouth daily.     No current facility-administered medications for this visit.    Allergies  Allergen Reactions  . Depakote [Divalproex Sodium] Other (See Comments)    Hair loss  . Seroquel [Quetiapine Fumarate] Other (See Comments)    irritable  . Azithromycin Rash  . Penicillins Rash  . Pregabalin Palpitations    Manic symptoms, no sleep x's 3 days, no appetite.  . Sulfa Antibiotics Rash    Family History  Problem Relation Age of Onset  . Diabetes Mother   . Hypertension Mother   . Cancer Father     brain tumor  . Cancer Maternal Grandmother   . Cancer Maternal Grandfather   . Cancer Paternal Grandmother   . Cancer Paternal Grandfather     History   Social History  . Marital Status: Married    Spouse  Name: Izell Carleton  . Number of Children: 0  . Years of Education: HS/college   Occupational History  .  Other    disabled   Social History Main Topics  . Smoking status: Never Smoker   . Smokeless tobacco: Never Used  . Alcohol Use: No     Comment: 1 holiday drink yearly  . Drug Use: No  . Sexual Activity: Not on file   Other Topics Concern  . Not on file   Social History Narrative   Patient is married Izell Ashkum) and lives at home with her husband.   Married 20+ years   From [place]   On disability for fibromyalgia   Caffeine Use: 1 soda daily     Constitutional: Pt reports headache, fatigue. Denies fever, malaise, or abrupt weight changes.  HEENT: Denies eye pain, eye redness, ear pain, ringing in the ears, wax buildup, runny nose, nasal congestion, bloody nose, or sore throat. Respiratory: Denies difficulty breathing,  shortness of breath, cough or sputum production.   Cardiovascular: Denies chest pain, chest tightness, palpitations or swelling in the hands or feet.   Neurological: Pt reports dizziness. Denies difficulty with memory, difficulty with speech or problems with balance and coordination.   No other specific complaints in a complete review of systems (except as listed in HPI above).  Objective:   Physical Exam  BP 116/74 mmHg  Pulse 77  Temp(Src) 98.2 F (36.8 C) (Oral)  Wt 175 lb (79.379 kg)  SpO2 98% Wt Readings from Last 3 Encounters:  12/25/14 175 lb (79.379 kg)  12/10/14 175 lb 12 oz (79.72 kg)  11/08/14 178 lb (80.74 kg)    General: Appears her stated age, uncomfortable but in NAD. Skin: Warm, dry and intact. No rashes, lesions or ulcerations noted. HEENT: Head: normal shape and size; Eyes: sclera white, no icterus, conjunctiva pink, PERRLA and EOMs intact; Cardiovascular: Normal rate and rhythm. S1,S2 noted.  No murmur, rubs or gallops noted.  Pulmonary/Chest: Normal effort and positive vesicular breath sounds. No respiratory distress. No wheezes, rales or ronchi noted.  Neurological: Alert and oriented.  BMET    Component Value Date/Time   NA 141 10/21/2014 1515   NA 141 06/17/2014 1511   K 4.7 10/21/2014 1515   K 4.0 06/17/2014 1511   CL 111 10/21/2014 1515   CL 109* 06/17/2014 1511   CO2 23 10/21/2014 1515   CO2 23 06/17/2014 1511   GLUCOSE 89 10/21/2014 1515   GLUCOSE 88 06/17/2014 1511   BUN 9 10/21/2014 1515   BUN 11 06/17/2014 1511   CREATININE 0.96 10/21/2014 1515   CREATININE 1.16 06/17/2014 1511   CALCIUM 8.6* 10/21/2014 1515   CALCIUM 8.4* 06/17/2014 1511   GFRNONAA >60 10/21/2014 1515   GFRNONAA 55* 12/21/2012 1636   GFRAA >60 10/21/2014 1515   GFRAA >60 12/21/2012 1636    Lipid Panel     Component Value Date/Time   CHOL 245* 02/26/2013 1053   TRIG 742.0* 02/26/2013 1053   HDL 34.20* 02/26/2013 1053   CHOLHDL 7 02/26/2013 1053   VLDL 148.4*  02/26/2013 1053    CBC    Component Value Date/Time   WBC 8.2 10/21/2014 1515   WBC 10.0 06/17/2014 1511   RBC 4.68 10/21/2014 1515   RBC 5.43* 06/17/2014 1511   HGB 12.8 10/21/2014 1515   HGB 14.9 06/17/2014 1511   HCT 39.1 10/21/2014 1515   HCT 46.4 06/17/2014 1511   PLT 212 10/21/2014 1515   PLT 290 06/17/2014 1511  MCV 83.5 10/21/2014 1515   MCV 85 06/17/2014 1511   MCH 27.4 10/21/2014 1515   MCH 27.4 06/17/2014 1511   MCHC 32.7 10/21/2014 1515   MCHC 32.1 06/17/2014 1511   RDW 13.6 10/21/2014 1515   RDW 14.7* 06/17/2014 1511   LYMPHSABS 2.2 10/21/2014 1515   LYMPHSABS 4.2* 11/05/2012 0547   MONOABS 0.6 10/21/2014 1515   MONOABS 1.0* 11/05/2012 0547   EOSABS 0.2 10/21/2014 1515   EOSABS 0.1 11/05/2012 0547   BASOSABS 0.1 10/21/2014 1515   BASOSABS 0.1 11/05/2012 0547    Hgb A1C No results found for: HGBA1C       Assessment & Plan:   Migraine:  Toradol 60 mg IM today Phenergan 25 mg IM today Continue Elavil and Imitrex Return precautions given  RTC as needed or if symptoms persist or worsen Ok to go home and take a Benadryl

## 2014-12-25 NOTE — Addendum Note (Signed)
Addended by: Lurlean Nanny on: 12/25/2014 04:09 PM   Modules accepted: Orders

## 2014-12-25 NOTE — Patient Instructions (Signed)

## 2014-12-30 ENCOUNTER — Other Ambulatory Visit: Payer: Self-pay | Admitting: Family Medicine

## 2014-12-30 NOTE — Telephone Encounter (Signed)
Last office visit 12/25/14 - Acute. Last refilled 09/09/14 #60. Is it okay to refill?

## 2014-12-30 NOTE — Telephone Encounter (Signed)
Rx called in as directed.   

## 2014-12-31 ENCOUNTER — Ambulatory Visit (INDEPENDENT_AMBULATORY_CARE_PROVIDER_SITE_OTHER): Payer: PPO | Admitting: Family Medicine

## 2014-12-31 ENCOUNTER — Encounter: Payer: Self-pay | Admitting: Family Medicine

## 2014-12-31 VITALS — BP 128/74 | HR 93 | Temp 98.2°F | Wt 178.0 lb

## 2014-12-31 DIAGNOSIS — G43801 Other migraine, not intractable, with status migrainosus: Secondary | ICD-10-CM | POA: Diagnosis not present

## 2014-12-31 MED ORDER — RIZATRIPTAN BENZOATE 10 MG PO TABS: 10.0000 mg | ORAL_TABLET | ORAL | Status: DC | PRN

## 2014-12-31 MED ORDER — PROMETHAZINE HCL 25 MG PO TABS: 25.0000 mg | ORAL_TABLET | Freq: Three times a day (TID) | ORAL | Status: DC | PRN

## 2014-12-31 MED ORDER — KETOROLAC TROMETHAMINE 60 MG/2ML IM SOLN
60.0000 mg | Freq: Once | INTRAMUSCULAR | Status: AC
  Administered 2014-12-31: 60 mg via INTRAMUSCULAR

## 2014-12-31 NOTE — Assessment & Plan Note (Signed)
Deteriorated. >25 minutes spent in face to face time with patient, >50% spent in counselling or coordination of care IM toradol given in office again, phenergan refilled to pharmacy. Continue elavil at current dose for prophylaxis as she thought this was effective until earlier this month. Imitrex "no longer effective."  We did discuss medication overuse headaches.  D/c imitrex, maxalt eRx sent for abortive therapy. Call or return to clinic prn if these symptoms worsen or fail to improve as anticipated. The patient indicates understanding of these issues and agrees with the plan.

## 2014-12-31 NOTE — Addendum Note (Signed)
Addended by: Modena Nunnery on: 12/31/2014 11:54 AM   Modules accepted: Orders

## 2014-12-31 NOTE — Patient Instructions (Signed)
Good to see you. Continue Elavil, stop imitrex. Start maxalt as needed.

## 2014-12-31 NOTE — Progress Notes (Signed)
Pre visit review using our clinic review tool, if applicable. No additional management support is needed unless otherwise documented below in the visit note. 

## 2014-12-31 NOTE — Progress Notes (Signed)
Subjective:   Patient ID: Melissa [name], female    DOB: 1971/01/03, 44 y.o.   MRN: [id number]  Melissa [name] is a pleasant 44 y.o. year old female who presents to clinic today with Migraine  on 12/31/2014  HPI:  Migraines- h/o migraines for years. Was on topamax which was working well but weaned herself off of it in January 2016 since it was making her feel "foggy headed."  In May, we started her on Elavil 10 mg nightly for prophylaxis which did seem effective initially.  Saw Webb Silversmith on 7/6 for 3 days of migraine symptoms.  Note reviewed. Percocet and imitrex at that time were ineffective.  Given IM Toradol and Phenergan.  Advised to continue Elavil and Imitrex.   Initially some relief from the Toradol and phenergan given in office "dulled" the headache but it never went away.  Past two days, has been come severe.  It is her typical migraine symptoms- left sided, associated with nausea and phonophobia. No vomiting.   Current Outpatient Prescriptions on File Prior to Visit  Medication Sig Dispense Refill  . amitriptyline (ELAVIL) 10 MG tablet Take 1 tablet (10 mg total) by mouth at bedtime. 30 tablet 3  . Cholecalciferol (VITAMIN D3) 1000 UNITS CAPS Take 3,000 Units by mouth daily.     . clonazePAM (KLONOPIN) 1 MG tablet TAKE ONE TABLET TWICE DAILY AS NEEDED FOR ANXIETY 60 tablet 0  . fexofenadine (ALLEGRA) 180 MG tablet Take 180 mg by mouth daily.    Marland Kitchen FLUoxetine (PROZAC) 10 MG capsule Take 10 mg by mouth daily.    Marland Kitchen gabapentin (NEURONTIN) 600 MG tablet TAKE ONE (1) TABLET THREE (3) TIMES EACH DAY 90 tablet 5  . promethazine (PHENERGAN) 25 MG tablet Take 1 tablet (25 mg total) by mouth every 8 (eight) hours as needed for nausea or vomiting. 15 tablet 0  . ranitidine (ZANTAC) 150 MG tablet Take 150 mg by mouth daily.    . SUMAtriptan (IMITREX) 100 MG tablet Take 1 tablet (100 mg total) by mouth every 2 (two) hours as needed for migraine or headache. May repeat in 2 hours if headache  persists or recurs. 10 tablet 0  . tiZANidine (ZANAFLEX) 4 MG tablet TAKE ONE TABLET EVERY EIGHT (8) HOURS ASNEEDED FOR MUSCLE SPASMS 30 tablet 5  . tretinoin microspheres (RETIN-A MICRO) 0.04 % gel APPLY TOPICALLY AT BEDTIME 45 g 2  . vitamin B-12 (CYANOCOBALAMIN) 1000 MCG tablet Take 1,000 mcg by mouth daily.     No current facility-administered medications on file prior to visit.    Allergies  Allergen Reactions  . Depakote [Divalproex Sodium] Other (See Comments)    Hair loss  . Seroquel [Quetiapine Fumarate] Other (See Comments)    irritable  . Azithromycin Rash  . Penicillins Rash  . Pregabalin Palpitations    Manic symptoms, no sleep x's 3 days, no appetite.  . Sulfa Antibiotics Rash    Past Medical History  Diagnosis Date  . Bipolar disorder   . Fibromyalgia     on disability  . Migraine   . Panic attack   . Muscle spasm   . Uterine perforation     hx of  . Syncope     recurrent  . POTS (postural orthostatic tachycardia syndrome)   . Pain of right side of body     chronic  . Arthritis     Past Surgical History  Procedure Laterality Date  . Dilation and curettage of uterus    .  Cataract extraction  dec 2014  . Lasik  dec 2014    Family History  Problem Relation Age of Onset  . Diabetes Mother   . Hypertension Mother   . Cancer Father     brain tumor  . Cancer Maternal Grandmother   . Cancer Maternal Grandfather   . Cancer Paternal Grandmother   . Cancer Paternal Grandfather     History   Social History  . Marital Status: Married    Spouse Name: Melissa [name]  . Number of Children: 0  . Years of Education: HS/college   Occupational History  .  Other    disabled   Social History Main Topics  . Smoking status: Never Smoker   . Smokeless tobacco: Never Used  . Alcohol Use: No     Comment: 1 holiday drink yearly  . Drug Use: No  . Sexual Activity: Not on file   Other Topics Concern  . Not on file   Social History Narrative   Patient is  married Melissa [name]) and lives at home with her husband.   Married 20+ years   From [place]   On disability for fibromyalgia   Caffeine Use: 1 soda daily   The PMH, PSH, Social History, Family History, Medications, and allergies have been reviewed in [facility], and have been updated if relevant.   Review of Systems  Constitutional: Negative.   Respiratory: Negative.   Neurological: Positive for headaches. Negative for dizziness, tremors, seizures, syncope, facial asymmetry, speech difficulty, weakness, light-headedness and numbness.  Psychiatric/Behavioral: Negative.   All other systems reviewed and are negative.      Objective:    BP 128/74 mmHg  Pulse 93  Temp(Src) 98.2 F (36.8 C) (Oral)  Wt 178 lb (80.74 kg)  SpO2 97%   Physical Exam  Constitutional: She appears well-developed and well-nourished. She appears distressed.  Appears uncomfortable, hand over left eye, eyes closed.  HENT:  Head: Normocephalic.  Eyes: Conjunctivae are normal.  Neck: Normal range of motion.  Cardiovascular: Normal rate.   Pulmonary/Chest: Effort normal.  Musculoskeletal: She exhibits no edema.  Skin:     Psychiatric: She has a normal mood and affect. Her behavior is normal. Judgment and thought content normal.  Nursing note and vitals reviewed.         Assessment & Plan:   Other migraine with status migrainosus, not intractable No Follow-up on file.

## 2015-01-01 ENCOUNTER — Telehealth: Payer: Self-pay | Admitting: *Deleted

## 2015-01-01 NOTE — Telephone Encounter (Signed)
Patient's husband called and said that her headache is still there despite the Rx that she was prescribed. She wants to know if there is anything else she can take or do. Or does she need to be re-evaluated?

## 2015-01-01 NOTE — Telephone Encounter (Signed)
Called patient notifying her of Dr. Hulen Shouts instructions. She states that she did repeat the dose of Maxalt. Patient verbalized understanding.

## 2015-01-01 NOTE — Telephone Encounter (Signed)
We dont have anything stronger we can give her in office in terms of injection.  Did she repeat the dose of maxalt?  She may need to go to an urgent care or ER if she needs something stronger as a one time tx for pain.

## 2015-01-03 ENCOUNTER — Encounter (HOSPITAL_COMMUNITY): Payer: Self-pay

## 2015-01-03 ENCOUNTER — Emergency Department (HOSPITAL_COMMUNITY)
Admission: EM | Admit: 2015-01-03 | Discharge: 2015-01-03 | Disposition: A | Discharge status: Home / Self Care | Payer: PPO | Attending: Emergency Medicine | Admitting: Emergency Medicine

## 2015-01-03 DIAGNOSIS — G43909 Migraine, unspecified, not intractable, without status migrainosus: Secondary | ICD-10-CM | POA: Insufficient documentation

## 2015-01-03 DIAGNOSIS — F319 Bipolar disorder, unspecified: Secondary | ICD-10-CM | POA: Diagnosis not present

## 2015-01-03 DIAGNOSIS — Z79899 Other long term (current) drug therapy: Secondary | ICD-10-CM | POA: Insufficient documentation

## 2015-01-03 DIAGNOSIS — M797 Fibromyalgia: Secondary | ICD-10-CM | POA: Insufficient documentation

## 2015-01-03 DIAGNOSIS — Z880 Allergy status to penicillin: Secondary | ICD-10-CM | POA: Insufficient documentation

## 2015-01-03 DIAGNOSIS — M1990 Unspecified osteoarthritis, unspecified site: Secondary | ICD-10-CM | POA: Diagnosis not present

## 2015-01-03 DIAGNOSIS — Z8742 Personal history of other diseases of the female genital tract: Secondary | ICD-10-CM | POA: Insufficient documentation

## 2015-01-03 DIAGNOSIS — F410 Panic disorder [episodic paroxysmal anxiety] without agoraphobia: Secondary | ICD-10-CM | POA: Diagnosis not present

## 2015-01-03 DIAGNOSIS — G43009 Migraine without aura, not intractable, without status migrainosus: Secondary | ICD-10-CM

## 2015-01-03 MED ORDER — METOCLOPRAMIDE HCL 5 MG/ML IJ SOLN
10.0000 mg | Freq: Once | INTRAMUSCULAR | Status: AC
  Administered 2015-01-03: 10 mg via INTRAVENOUS
  Filled 2015-01-03: qty 2

## 2015-01-03 MED ORDER — DIPHENHYDRAMINE HCL 50 MG/ML IJ SOLN
50.0000 mg | Freq: Once | INTRAMUSCULAR | Status: AC
  Administered 2015-01-03: 50 mg via INTRAVENOUS
  Filled 2015-01-03: qty 1

## 2015-01-03 MED ORDER — SODIUM CHLORIDE 0.9 % IV BOLUS (SEPSIS)
1000.0000 mL | Freq: Once | INTRAVENOUS | Status: AC
  Administered 2015-01-03: 1000 mL via INTRAVENOUS

## 2015-01-03 NOTE — Discharge Instructions (Signed)
Migraine Headache Follow-up with Dr. Deborra Medina as needed if headaches continue or if concern for any reason. No driving for the next 24 hours as you were given medication which can cause drowsiness. A migraine headache is very bad, throbbing pain on one or both sides of your head. Talk to your doctor about what things may bring on (trigger) your migraine headaches. HOME CARE  Only take medicines as told by your doctor.  Lie down in a dark, quiet room when you have a migraine.  Keep a journal to find out if certain things bring on migraine headaches. For example, write down:  What you eat and drink.  How much sleep you get.  Any change to your diet or medicines.  Lessen how much alcohol you drink.  Quit smoking if you smoke.  Get enough sleep.  Lessen any stress in your life.  Keep lights dim if bright lights bother you or make your migraines worse. GET HELP RIGHT AWAY IF:   Your migraine becomes really bad.  You have a fever.  You have a stiff neck.  You have trouble seeing.  Your muscles are weak, or you lose muscle control.  You lose your balance or have trouble walking.  You feel like you will pass out (faint), or you pass out.  You have really bad symptoms that are different than your first symptoms. MAKE SURE YOU:   Understand these instructions.  Will watch your condition.  Will get help right away if you are not doing well or get worse. Document Released: 03/16/2008 Document Revised: 08/30/2011 Document Reviewed: 02/12/2013 [facility] Patient Information 2015 [place], [place]. This information is not intended to replace advice given to you by your health care provider. Make sure you discuss any questions you have with your health care provider.

## 2015-01-03 NOTE — ED Provider Notes (Signed)
CSN: [id number]     Arrival date & time 01/03/15  1433 History   First MD Initiated Contact with Patient 01/03/15 1631     Chief Complaint  Patient presents with  . Migraine     (Consider location/radiation/quality/duration/timing/severity/associated sxs/prior Treatment) HPI Complains of typical migraine headache gradual onset p 2 weeks ago. Pain waxes and wanes left sided throbbing associated symptoms include photophobia, nausea and hyperacusis. She is also slightly lightheaded with standing she thinks is a result of nausea and not drinking adequately. Pain is made worse with bright lights and loud noises improved when she remains in a dark quiet area. She saw her primary care physician on 12/31/2014 she was treated with Phenergan and Toradol, with partial relief. She's also taken Percocet at home without relief. No other associated symptoms Past Medical History  Diagnosis Date  . Bipolar disorder   . Fibromyalgia     on disability  . Migraine   . Panic attack   . Muscle spasm   . Uterine perforation     hx of  . Syncope     recurrent  . POTS (postural orthostatic tachycardia syndrome)   . Pain of right side of body     chronic  . Arthritis    Past Surgical History  Procedure Laterality Date  . Dilation and curettage of uterus    . Cataract extraction  dec 2014  . Lasik  dec 2014   Family History  Problem Relation Age of Onset  . Diabetes Mother   . Hypertension Mother   . Cancer Father     brain tumor  . Cancer Maternal Grandmother   . Cancer Maternal Grandfather   . Cancer Paternal Grandmother   . Cancer Paternal Grandfather    History  Substance Use Topics  . Smoking status: Never Smoker   . Smokeless tobacco: Never Used  . Alcohol Use: No     Comment: 1 holiday drink yearly   OB History    No data available     Review of Systems  Constitutional: Negative.   HENT:       Hyperacusis  Eyes: Positive for photophobia.  Respiratory: Negative.    Cardiovascular: Negative.   Gastrointestinal: Negative.   Musculoskeletal: Negative.   Skin: Negative.   Neurological: Positive for light-headedness and headaches.  Psychiatric/Behavioral: Negative.   All other systems reviewed and are negative.     Allergies  Depakote; Seroquel; Azithromycin; Penicillins; Pregabalin; and Sulfa antibiotics  Home Medications   Prior to Admission medications   Medication Sig Start Date End Date Taking? Authorizing Provider  amitriptyline (ELAVIL) 10 MG tablet Take 1 tablet (10 mg total) by mouth at bedtime. 10/21/14   Lucille Passy, MD  Cholecalciferol (VITAMIN D3) 1000 UNITS CAPS Take 3,000 Units by mouth daily.     Historical Provider, MD  clonazePAM (KLONOPIN) 1 MG tablet TAKE ONE TABLET TWICE DAILY AS NEEDED FOR ANXIETY 12/30/14   Lucille Passy, MD  fexofenadine (ALLEGRA) 180 MG tablet Take 180 mg by mouth daily.    Historical Provider, MD  FLUoxetine (PROZAC) 10 MG capsule Take 10 mg by mouth daily.    Historical Provider, MD  gabapentin (NEURONTIN) 600 MG tablet TAKE ONE (1) TABLET THREE (3) TIMES EACH DAY 11/27/14   Lucille Passy, MD  promethazine (PHENERGAN) 25 MG tablet Take 1 tablet (25 mg total) by mouth every 8 (eight) hours as needed for nausea or vomiting. 12/31/14   Lucille Passy, MD  ranitidine Keturah Shavers)  150 MG tablet Take 150 mg by mouth daily.    Historical Provider, MD  rizatriptan (MAXALT) 10 MG tablet Take 1 tablet (10 mg total) by mouth as needed for migraine. May repeat in 2 hours if needed 12/31/14   Lucille Passy, MD  tiZANidine (ZANAFLEX) 4 MG tablet TAKE ONE TABLET EVERY EIGHT (8) HOURS ASNEEDED FOR MUSCLE SPASMS 09/02/14   Lucille Passy, MD  tretinoin microspheres (RETIN-A MICRO) 0.04 % gel APPLY TOPICALLY AT BEDTIME 11/14/14   Lucille Passy, MD  vitamin B-12 (CYANOCOBALAMIN) 1000 MCG tablet Take 1,000 mcg by mouth daily.    Historical Provider, MD   BP 120/62 mmHg  Pulse 90  Temp(Src) 98 F (36.7 C) (Oral)  Resp 18  SpO2 97% Physical  Exam  Constitutional: She is oriented to person, place, and time. She appears well-developed and well-nourished. No distress.  HENT:  Head: Normocephalic and atraumatic.  Eyes: Conjunctivae are normal. Pupils are equal, round, and reactive to light.  Neck: Neck supple. No tracheal deviation present. No thyromegaly present.  Cardiovascular: Normal rate and regular rhythm.   No murmur heard. Pulmonary/Chest: Effort normal and breath sounds normal.  Abdominal: Soft. Bowel sounds are normal. She exhibits no distension. There is no tenderness.  Musculoskeletal: Normal range of motion. She exhibits no edema or tenderness.  Neurological: She is alert and oriented to person, place, and time. No cranial nerve deficit. Coordination normal.  Gait normal finger to nose normal DTR symmetric bilaterally knee jerk ankle jerk and biceps toes or going bilaterally  Skin: Skin is warm and dry. No rash noted.  Psychiatric: She has a normal mood and affect.  Nursing note and vitals reviewed.   ED Course  Procedures (including critical care time) Labs Review Labs Reviewed - No data to display  Imaging Review No results found.   EKG Interpretation None     7:35 PM feels improved and ready to go home after treatment with intravenous Reglan and intravenous Benadryl and intravenous fluids  MDM  Plan follow-up with Dr. Deborra Medina as needed Diagnosis migraine headache Final diagnoses:  None        Orlie Dakin, MD 01/03/15 1936

## 2015-01-03 NOTE — ED Notes (Signed)
Pt presents with c/o migraine that she reports has been on and off for approx 2 weeks. Pt reports she was seen by her doctor on Tuesday of this week and they reported that there was nothing else that they could do for her in the office and that she would have to come to the ER to be seen as they could not give her another stronger than what she had at home or what they had given her. Pt reports some nausea with the migraine and some light sensitivity.

## 2015-01-08 ENCOUNTER — Telehealth: Payer: Self-pay | Admitting: Family Medicine

## 2015-01-08 DIAGNOSIS — G43819 Other migraine, intractable, without status migrainosus: Secondary | ICD-10-CM

## 2015-01-08 NOTE — Telephone Encounter (Signed)
Referral placed.

## 2015-01-08 NOTE — Telephone Encounter (Signed)
Pt spouse called and request referral to [facility]/Dr. Jaynee Eagles for her headaches. Pt spouse aware once referral is entered, [facility] will call them to set up appointment.

## 2015-01-27 ENCOUNTER — Other Ambulatory Visit: Payer: Self-pay | Admitting: Family Medicine

## 2015-01-27 NOTE — Telephone Encounter (Signed)
Rx called in to requested pharmacy 

## 2015-01-27 NOTE — Telephone Encounter (Signed)
Last f/u appt 11/2014 

## 2015-02-04 ENCOUNTER — Institutional Professional Consult (permissible substitution): Payer: Self-pay | Admitting: Neurology

## 2015-02-25 ENCOUNTER — Ambulatory Visit (INDEPENDENT_AMBULATORY_CARE_PROVIDER_SITE_OTHER): Payer: PPO | Admitting: Neurology

## 2015-02-25 ENCOUNTER — Encounter: Payer: Self-pay | Admitting: Neurology

## 2015-02-25 VITALS — BP 118/82 | HR 116 | Ht 69.0 in | Wt 177.0 lb

## 2015-02-25 DIAGNOSIS — R112 Nausea with vomiting, unspecified: Secondary | ICD-10-CM

## 2015-02-25 DIAGNOSIS — G43901 Migraine, unspecified, not intractable, with status migrainosus: Secondary | ICD-10-CM | POA: Diagnosis not present

## 2015-02-25 MED ORDER — SUMATRIPTAN SUCCINATE 11 MG/NOSEPC NA EXHP: 2.0000 | INHALANT_POWDER | Freq: Once | NASAL | Status: DC

## 2015-02-25 MED ORDER — ZOLMITRIPTAN 5 MG NA SOLN: 1.0000 | NASAL | Status: DC | PRN

## 2015-02-25 MED ORDER — ONDANSETRON 4 MG PO TBDP: 4.0000 mg | ORAL_TABLET | Freq: Three times a day (TID) | ORAL | Status: DC | PRN

## 2015-02-25 NOTE — Patient Instructions (Signed)
Overall you are doing fairly well but I do want to suggest a few things today:   Remember to drink plenty of fluid, eat healthy meals and do not skip any meals. Try to eat protein with a every meal and eat a healthy snack such as fruit or nuts in between meals. Try to keep a regular sleep-wake schedule and try to exercise daily, particularly in the form of walking, 20-30 minutes a day, if you can.   As far as your medications are concerned, I would like to suggest: At onset of headache take Onzetra. Can repeat in 2 hours. Can try Zomig but not on the same day as the Onzetra Take with tylenol or an nsaid Take with zofran  I would like to see you back as needed, sooner if we need to. Please call us with any interim questions, concerns, problems, updates or refill requests.   Our phone number is [phone]. We also have an after hours call service for urgent matters and there is a physician on-call for urgent questions. For any emergencies you know to call 911 or go to the nearest emergency room

## 2015-02-25 NOTE — Progress Notes (Signed)
[facility]    Provider:  Dr Jaynee Eagles Referring Provider: Lucille Passy, MD Primary Care Physician:  Arnette Norris, MD  CC:  migraines  HPI:  Melissa [name] is a lovely 44 y.o. female here as a referral from Dr. Deborra Medina for migraines. She has had migraines for "years and years". They are unilateral, anterior, like someone has stuck a steel rod through the brain. No light sensitivity, but sound really bothers her. She needs to be in a silent room ,movement makes it worse, she gets dizzy and lightheaded. She sees flashing lights, a cloudiness over the eye right eye during the migraine. No preceding aura. No triggers except stress. She is nauseated and vomiting during the headache. Can last up to a week, she goes to the emergency room and gets a cocktail. She has tried Topamax in the past. She took Topiramate 25 or 50 mg and she felt numb all the time. She stopped in February due to memory problems. She is more hyper since she is not on the Topamax. Noise bothers her more. She is more agitated and has less patience. Tried amitriptyline. She doesn't have then that often, 4x a year but they can last a week or more of the extremely severe. Even though she doesn't have the headaches that often when she gets them they can be persistent to keep her out of work that whole week. She takes imitrex and it diesn't work, she throws it up. She takes phenergan. When she was on the Topamax the headaches were the same frequency but they lasted shorter and they were less severe.  Reviewed notes, labs and imaging from outside physicians, which showed:  BMP and CBC normal. B12 777 apparently was low at 2 to a year ago. TSH within normal limits.  Personally reviewed images of MRI of the brain in November 2015 and agree with the following report The brain has a normal appearance on all pulse sequences without evidence of malformation, atrophy, old or acute infarction, mass lesion, hemorrhage, hydrocephalus or  extra-axial collection. No pituitary mass. No fluid in the sinuses, middle ears or mastoids. No skull or skullbase lesion. There is flow in the major vessels at the base of the brain. Major venous sinuses show flow. IMPRESSION: Normal examination. No abnormality seen to explain visual disturbance.  Review of Systems: Patient complains of symptoms per HPI as well as the following symptoms: Weight gain, fatigue, diarrhea, joint pain, aching muscles, allergies, headache, dizziness, passing out, insomnia. Pertinent negatives per HPI. All others negative.   Social History   Social History  . Marital Status: Married    Spouse Name: Izell [place]  . Number of Children: 0  . Years of Education: HS/college   Occupational History  .  Other    disabled   Social History Main Topics  . Smoking status: Never Smoker   . Smokeless tobacco: Never Used  . Alcohol Use: No     Comment: 1 holiday drink yearly  . Drug Use: No  . Sexual Activity: Not on file   Other Topics Concern  . Not on file   Social History Narrative   Patient is married Izell [place]) and lives at home with her husband.   Married 20+ years   From [place]   On disability for fibromyalgia   Caffeine Use: none        Family History  Problem Relation Age of Onset  . Diabetes Mother   . Hypertension Mother   . Cancer Father  brain tumor  . Cancer Maternal Grandmother   . Cancer Maternal Grandfather   . Cancer Paternal Grandmother   . Cancer Paternal Grandfather     Past Medical History  Diagnosis Date  . Bipolar disorder   . Fibromyalgia     on disability  . Migraine   . Panic attack   . Muscle spasm   . Uterine perforation     hx of  . Syncope     recurrent  . POTS (postural orthostatic tachycardia syndrome)   . Pain of right side of body     chronic  . Arthritis     Past Surgical History  Procedure Laterality Date  . Dilation and curettage of uterus    . Cataract extraction  dec 2014  . Lasik  dec 2014     Current Outpatient Prescriptions  Medication Sig Dispense Refill  . Cholecalciferol (VITAMIN D3) 1000 UNITS CAPS Take 3,000 Units by mouth daily.     . clonazePAM (KLONOPIN) 1 MG tablet TAKE ONE TABLET TWICE DAILY AS NEEDED FOR ANXIETY 60 tablet 0  . fexofenadine (ALLEGRA) 180 MG tablet Take 180 mg by mouth daily.    Marland Kitchen gabapentin (NEURONTIN) 600 MG tablet TAKE ONE (1) TABLET THREE (3) TIMES EACH DAY (Patient taking differently: Take one (1) TABLET three (3) times each day.) 90 tablet 5  . medroxyPROGESTERone (DEPO-PROVERA) 150 MG/ML injection Inject 150 mg into the muscle every 3 (three) months.    . promethazine (PHENERGAN) 25 MG tablet Take 1 tablet (25 mg total) by mouth every 8 (eight) hours as needed for nausea or vomiting. 30 tablet 1  . ranitidine (ZANTAC) 150 MG tablet Take 150 mg by mouth daily.    . rizatriptan (MAXALT) 10 MG tablet Take 1 tablet (10 mg total) by mouth as needed for migraine. May repeat in 2 hours if needed 10 tablet 0  . tiZANidine (ZANAFLEX) 4 MG tablet TAKE ONE TABLET EVERY EIGHT (8) HOURS ASNEEDED FOR MUSCLE SPASMS (Patient taking differently: Take one tablet every eight (8) hours as needed for muscle spasms.) 30 tablet 5  . vitamin B-12 (CYANOCOBALAMIN) 1000 MCG tablet Take 1,000 mcg by mouth daily.    . ondansetron (ZOFRAN-ODT) 4 MG disintegrating tablet Take 1 tablet (4 mg total) by mouth every 8 (eight) hours as needed for nausea. 30 tablet 11  . SUMAtriptan Succinate (ONZETRA XSAIL) 11 MG/NOSEPC EXHP Place 2 sprays into the nose once. 6 each 11  . zolmitriptan (ZOMIG) 5 MG nasal solution Place 1 spray into the nose as needed for migraine. 6 Units 0   No current facility-administered medications for this visit.    Allergies as of 02/25/2015 - Review Complete 02/25/2015  Allergen Reaction Noted  . Depakote [divalproex sodium] Other (See Comments) 11/28/2012  . Erythromycin base Swelling 01/03/2015  . Seroquel [quetiapine fumarate] Other (See Comments)  11/28/2012  . Ziprasidone hcl Other (See Comments) 01/03/2015  . Azithromycin Rash 09/12/2012  . Penicillins Rash 09/12/2012  . Pregabalin Palpitations 11/23/2013  . Sulfa antibiotics Rash 09/12/2012    Vitals: BP 118/82 mmHg  Pulse 116  Ht 5\' 9"  (1.753 m)  Wt 177 lb (80.287 kg)  BMI 26.13 kg/m2 Last Weight:  Wt Readings from Last 1 Encounters:  02/25/15 177 lb (80.287 kg)   Last Height:   Ht Readings from Last 1 Encounters:  02/25/15 5\' 9"  (1.753 m)   Physical exam: Exam: Gen: NAD, conversant, well nourised, well groomed  CV: RRR, no MRG. No Carotid Bruits. No peripheral edema, warm, nontender Eyes: Conjunctivae clear without exudates or hemorrhage  Neuro: Detailed Neurologic Exam  Speech:    Speech is normal; fluent and spontaneous with normal comprehension.  Cognition:    The patient is oriented to person, place, and time;     recent and remote memory intact;     language fluent;     normal attention, concentration,     fund of knowledge Cranial Nerves:    The pupils are equal, round, and reactive to light. The fundi are normal and spontaneous venous pulsations are present. Visual fields are full to finger confrontation. Extraocular movements are intact. Trigeminal sensation is intact and the muscles of mastication are normal. The face is symmetric. The palate elevates in the midline. Hearing intact. Voice is normal. Shoulder shrug is normal. The tongue has normal motion without fasciculations.   Coordination:    Normal finger to nose and heel to shin. Normal rapid alternating movements.   Gait:    Heel-toe and tandem gait are normal.   Motor Observation:    No asymmetry, no atrophy, and no involuntary movements noted. Tone:    Normal muscle tone.    Posture:    Posture is normal. normal erect    Strength:    Strength is V/V in the upper and lower limbs.      Sensation: intact to LT     Reflex Exam:  DTR's:    Deep tendon reflexes  in the upper and lower extremities are normal bilaterally.   Toes:    The toes are downgoing bilaterally.   Clonus:    Clonus is absent.       Assessment/Plan:  This is a very lovely 43 year old female with a long history of migraines. She has the migraines 4-6 times a year which is not often however when she does have some they can last up to a week, they can be severe, they can be persistent and she has missed weeks of work due to them. She has tried taking Topamax in the past. She has tried Imitrex which didn't work. I had a long discussion with her and her very nice husband about migraines and migraine treatment and whether to use preventative or acute management. Since she doesn't have migraines that often we can try treating them acutely and see if that works. We'll try [place] which is a new powdered form of sumatriptan with a nasal application. She can take that at onset with repeat 2 hours later, do not take more than twice in the same day. Discussed side effects including dizziness, nausea, chest pain, shortness of breath and serious side effects including vasospasm causing stroke or cardiac events. I would also like her to take this with naproxen or Tylenol as well as Zofran. Hopefully this will work for acute management. If not we'll try something else, encouraged patient to call me and give me feedback and let me now.  Sarina Ill, MD  [facility] [address] [place], [place] [address]  Phone [phone] Fax [phone]  A total of 60 minutes was spent face-to-face with this patient. Over half this time was spent on counseling patient on the migraine diagnosis and different diagnostic and therapeutic options available.

## 2015-02-26 ENCOUNTER — Encounter: Payer: Self-pay | Admitting: Neurology

## 2015-02-28 ENCOUNTER — Other Ambulatory Visit: Payer: Self-pay | Admitting: Family Medicine

## 2015-02-28 NOTE — Telephone Encounter (Signed)
Last office visit 12/31/2014.  Last refilled 09/02/2014 for #30 with 5 refills.  Ok to refill?

## 2015-03-10 ENCOUNTER — Encounter: Payer: Self-pay | Admitting: Family Medicine

## 2015-03-10 ENCOUNTER — Ambulatory Visit (INDEPENDENT_AMBULATORY_CARE_PROVIDER_SITE_OTHER): Payer: PPO | Admitting: Family Medicine

## 2015-03-10 VITALS — BP 126/80 | HR 87 | Temp 98.3°F | Wt 175.8 lb

## 2015-03-10 DIAGNOSIS — M797 Fibromyalgia: Secondary | ICD-10-CM | POA: Diagnosis not present

## 2015-03-10 DIAGNOSIS — G43801 Other migraine, not intractable, with status migrainosus: Secondary | ICD-10-CM | POA: Diagnosis not present

## 2015-03-10 DIAGNOSIS — F314 Bipolar disorder, current episode depressed, severe, without psychotic features: Secondary | ICD-10-CM

## 2015-03-10 DIAGNOSIS — Z3042 Encounter for surveillance of injectable contraceptive: Secondary | ICD-10-CM | POA: Diagnosis not present

## 2015-03-10 MED ORDER — TRAMADOL HCL 50 MG PO TABS: 50.0000 mg | ORAL_TABLET | Freq: Three times a day (TID) | ORAL | Status: DC | PRN

## 2015-03-10 MED ORDER — MEDROXYPROGESTERONE ACETATE 150 MG/ML IM SUSP: 150.0000 mg | Freq: Once | INTRAMUSCULAR | Status: DC

## 2015-03-10 MED ORDER — TRAMADOL HCL 50 MG PO TABS
50.0000 mg | ORAL_TABLET | Freq: Three times a day (TID) | ORAL | Status: DC | PRN
Start: 2015-03-10 — End: 2015-03-10

## 2015-03-10 NOTE — Progress Notes (Signed)
Pre visit review using our clinic review tool, if applicable. No additional management support is needed unless otherwise documented below in the visit note. 

## 2015-03-10 NOTE — Assessment & Plan Note (Signed)
Deteriorated. >25 minutes spent in face to face time with patient, >50% spent in counselling or coordination of care Rx given for prn tramadol for severe pain. Call or return to clinic prn if these symptoms worsen or fail to improve as anticipated. The patient indicates understanding of these issues and agrees with the plan.

## 2015-03-10 NOTE — Progress Notes (Signed)
Subjective:   Patient ID: Melissa [name], female    DOB: 21-Apr-1971, 44 y.o.   MRN: [id number]  Melissa [name] is a pleasant 44 y.o. year old female who presents to clinic today with Follow-up; insomnia; and Injections  on 03/10/2015  HPI:  Fibromyalgia- worsening lately.  No longer sees fibromyalgia specialist.  Only taking Gabapentin for this.  Pain was severe past 2 weeks, that she is having a hard time sleeping.  Pain is "everywhere."  Due for depo today.  Denies feeling depressed or anxious other than about her current pain.  She has been on disability for fibromyalgia for years.   Has been seeing Dr. Jaynee Eagles, neurology, for her migraines.  She is pleased with her. Current Outpatient Prescriptions on File Prior to Visit  Medication Sig Dispense Refill  . Cholecalciferol (VITAMIN D3) 1000 UNITS CAPS Take 3,000 Units by mouth daily.     . clonazePAM (KLONOPIN) 1 MG tablet TAKE ONE TABLET TWICE DAILY AS NEEDED FOR ANXIETY 60 tablet 0  . fexofenadine (ALLEGRA) 180 MG tablet Take 180 mg by mouth daily.    Marland Kitchen gabapentin (NEURONTIN) 600 MG tablet TAKE ONE (1) TABLET THREE (3) TIMES EACH DAY (Patient taking differently: Take one (1) TABLET three (3) times each day.) 90 tablet 5  . medroxyPROGESTERone (DEPO-PROVERA) 150 MG/ML injection Inject 150 mg into the muscle every 3 (three) months.    . ondansetron (ZOFRAN-ODT) 4 MG disintegrating tablet Take 1 tablet (4 mg total) by mouth every 8 (eight) hours as needed for nausea. 30 tablet 11  . promethazine (PHENERGAN) 25 MG tablet Take 1 tablet (25 mg total) by mouth every 8 (eight) hours as needed for nausea or vomiting. 30 tablet 1  . ranitidine (ZANTAC) 150 MG tablet Take 150 mg by mouth daily.    . SUMAtriptan Succinate (ONZETRA XSAIL) 11 MG/NOSEPC EXHP Place 2 sprays into the nose once. 6 each 11  . tiZANidine (ZANAFLEX) 4 MG tablet TAKE ONE TABLET EVERY EIGHT (8) HOURS ASNEEDED FOR MUSCLE SPASMS 30 tablet 0  . vitamin B-12 (CYANOCOBALAMIN) 1000  MCG tablet Take 1,000 mcg by mouth daily.    Marland Kitchen zolmitriptan (ZOMIG) 5 MG nasal solution Place 1 spray into the nose as needed for migraine. 6 Units 0   No current facility-administered medications on file prior to visit.    Allergies  Allergen Reactions  . Depakote [Divalproex Sodium] Other (See Comments)    Hair loss  . Erythromycin Base Swelling  . Seroquel [Quetiapine Fumarate] Other (See Comments)    irritable  . Ziprasidone Hcl Other (See Comments)    Heat/cold intolerance.   . Azithromycin Rash  . Penicillins Rash  . Pregabalin Palpitations    Manic symptoms, no sleep x's 3 days, no appetite.  . Sulfa Antibiotics Rash    Past Medical History  Diagnosis Date  . Bipolar disorder   . Fibromyalgia     on disability  . Migraine   . Panic attack   . Muscle spasm   . Uterine perforation     hx of  . Syncope     recurrent  . POTS (postural orthostatic tachycardia syndrome)   . Pain of right side of body     chronic  . Arthritis     Past Surgical History  Procedure Laterality Date  . Dilation and curettage of uterus    . Cataract extraction  dec 2014  . Lasik  dec 2014    Family History  Problem Relation Age  of Onset  . Diabetes Mother   . Hypertension Mother   . Cancer Father     brain tumor  . Cancer Maternal Grandmother   . Cancer Maternal Grandfather   . Cancer Paternal Grandmother   . Cancer Paternal Grandfather   . Migraines Neg Hx     Social History   Social History  . Marital Status: Married    Spouse Name: [name]  . Number of Children: 0  . Years of Education: HS/college   Occupational History  .  Other    disabled   Social History Main Topics  . Smoking status: Never Smoker   . Smokeless tobacco: Never Used  . Alcohol Use: No     Comment: 1 holiday drink yearly  . Drug Use: No  . Sexual Activity: Not on file   Other Topics Concern  . Not on file   Social History Narrative   Patient is married [name]) and lives at home with  her husband.   Married 20+ years   From [place]   On disability for fibromyalgia   Caffeine Use: none       The PMH, PSH, Social History, Family History, Medications, and allergies have been reviewed in [facility], and have been updated if relevant.   Review of Systems  Constitutional: Negative.   Respiratory: Negative.   Cardiovascular: Negative.   Gastrointestinal: Negative.   Endocrine: Negative.   Genitourinary: Negative.   Musculoskeletal: Positive for myalgias.  Skin: Negative.   Allergic/Immunologic: Negative.   Neurological: Positive for headaches.  Hematological: Negative.   Psychiatric/Behavioral: Negative.   All other systems reviewed and are negative.      Objective:    BP 126/80 mmHg  Pulse 87  Temp(Src) 98.3 F (36.8 C) (Oral)  Wt 175 lb 12 oz (79.72 kg)  SpO2 97%   Physical Exam  Constitutional: She is oriented to person, place, and time. She appears well-developed and well-nourished. No distress.  HENT:  Head: Normocephalic and atraumatic.  Eyes: Conjunctivae are normal.  Neck: Normal range of motion.  Cardiovascular: Normal rate, regular rhythm and normal heart sounds.   Pulmonary/Chest: Effort normal and breath sounds normal. No respiratory distress. She has no wheezes. She has no rales. She exhibits no tenderness.  Musculoskeletal: Normal range of motion. She exhibits no edema.  Neurological: She is alert and oriented to person, place, and time. No cranial nerve deficit.  Skin: Skin is warm and dry.  Psychiatric: She has a normal mood and affect. Her behavior is normal. Judgment and thought content normal.  tearful  Nursing note and vitals reviewed.         Assessment & Plan:   Fibromyalgia - Plan: traMADol (ULTRAM) 50 MG tablet, DISCONTINUED: traMADol (ULTRAM) 50 MG tablet  Bipolar disorder, current episode depressed, severe, without psychotic features  Other migraine with status migrainosus, not intractable  Encounter for surveillance of  injectable contraceptive - Plan: medroxyPROGESTERone (DEPO-PROVERA) injection 150 mg No Follow-up on file.

## 2015-03-10 NOTE — Patient Instructions (Signed)
Great to see you. We are trying tramadol as needed for needed.

## 2015-03-11 ENCOUNTER — Telehealth: Payer: Self-pay | Admitting: Family Medicine

## 2015-03-11 DIAGNOSIS — M797 Fibromyalgia: Secondary | ICD-10-CM

## 2015-03-11 NOTE — Telephone Encounter (Signed)
Spoke to pt and advised per Dr Deborra Medina. Pt verbally expressed understanding and will contact office back with update next week

## 2015-03-11 NOTE — Telephone Encounter (Signed)
Lm on pts vm requesting a call back 

## 2015-03-11 NOTE — Telephone Encounter (Signed)
Pt called to let you know the med that was prescribed yesterday is not working at [facility]

## 2015-03-11 NOTE — Telephone Encounter (Signed)
I'm sorry to hear that.  That's give it a little more time.

## 2015-03-11 NOTE — Telephone Encounter (Signed)
Pt called back and pt took Tramadol 50 mg two tabs one hour ago and still no relief from pain; pt understands those are not the instructions with the prescribed med but pt said she has not slept for 4 nights due to the muscular pain all over her body. Years ago pt took Tramadol without relief of pain and pt does not want to go another week hurting. Pt request different med to [facility]. Pt request cb.

## 2015-03-11 NOTE — Telephone Encounter (Signed)
Like we discussed yesterday, narcotics are not great for managing fibromyalgia chronically and they did not work well for her in the past.  I am unclear as to what she is asking for.  She is already taking clonazepam twice daily so we cannot use this as an additional sleep aid.  Has she tried trazodone for sleep?

## 2015-03-12 ENCOUNTER — Ambulatory Visit: Payer: PPO | Admitting: Family Medicine

## 2015-03-12 ENCOUNTER — Ambulatory Visit (INDEPENDENT_AMBULATORY_CARE_PROVIDER_SITE_OTHER): Payer: PPO | Admitting: Family Medicine

## 2015-03-12 ENCOUNTER — Encounter: Payer: Self-pay | Admitting: Family Medicine

## 2015-03-12 VITALS — BP 102/68 | HR 101 | Temp 98.3°F | Wt 176.0 lb

## 2015-03-12 DIAGNOSIS — M797 Fibromyalgia: Secondary | ICD-10-CM | POA: Diagnosis not present

## 2015-03-12 DIAGNOSIS — R3915 Urgency of urination: Secondary | ICD-10-CM

## 2015-03-12 LAB — POCT URINALYSIS DIPSTICK
BILIRUBIN UA: NEGATIVE
Glucose, UA: NEGATIVE
Ketones, UA: NEGATIVE
Leukocytes, UA: NEGATIVE
Nitrite, UA: NEGATIVE
PH UA: 6
Protein, UA: NEGATIVE
RBC UA: NEGATIVE
SPEC GRAV UA: 1.025
Urobilinogen, UA: 0.2

## 2015-03-12 MED ORDER — TRAZODONE HCL 150 MG PO TABS: 150.0000 mg | ORAL_TABLET | Freq: Every evening | ORAL | Status: DC | PRN

## 2015-03-12 MED ORDER — HYDROCODONE-ACETAMINOPHEN 5-325 MG PO TABS: 1.0000 | ORAL_TABLET | Freq: Four times a day (QID) | ORAL | Status: DC | PRN

## 2015-03-12 NOTE — Telephone Encounter (Signed)
Spoke to Melissa [name] and advised per Dr Deborra Medina. She states that she is in extreme pain, and is unable to urinate. She is requesting a referral to pain management.

## 2015-03-12 NOTE — Patient Instructions (Signed)
Please stop taking Tramadol. Try Norco and keep me updated.

## 2015-03-12 NOTE — Addendum Note (Signed)
Addended by: Lucille Passy on: 03/12/2015 08:55 AM   Modules accepted: Orders

## 2015-03-12 NOTE — Progress Notes (Signed)
Subjective:   Patient ID: Melissa [name], female    DOB: 10-Feb-1971, 44 y.o.   MRN: [id number]  MALESHA SULIMAN is a pleasant 44 y.o. year old female who presents to clinic today with Urinary Urgency; Pain; and Nausea  on 03/12/2015  HPI:  Fibromyalgia- I saw her for worsening fibromyalgia pain 2 days ago.   No longer sees fibromyalgia specialist.  Only taking Gabapentin for this.  Pain was severe past 2 weeks, that she is having a hard time sleeping.  Pain is "everywhere."  Denies feeling depressed or anxious other than about her current pain.  She has been on disability for fibromyalgia for years.  Given rx for tramadol to try as needed for pain.  She called office to say that it was ineffective at which point I suggested a trial of trazodone for insomnia and referral back to pain management.  She is not pleased with my suggestions and is here to discuss this with me today.  Not sleeping well.  Took trazodone 100 mg nightly years ago but it became ineffective.  Does take Kloninpin but not nightly.  Does not help much with insomnia, just anxiety.  Also has been having increased urinary frequency and urgency for past 2 days. No dysuria.  She has been nauseated but not vomiting.   No back pain.   Current Outpatient Prescriptions on File Prior to Visit  Medication Sig Dispense Refill  . Cholecalciferol (VITAMIN D3) 1000 UNITS CAPS Take 3,000 Units by mouth daily.     . clonazePAM (KLONOPIN) 1 MG tablet TAKE ONE TABLET TWICE DAILY AS NEEDED FOR ANXIETY 60 tablet 0  . fexofenadine (ALLEGRA) 180 MG tablet Take 180 mg by mouth daily.    Marland Kitchen gabapentin (NEURONTIN) 600 MG tablet TAKE ONE (1) TABLET THREE (3) TIMES EACH DAY (Patient taking differently: Take one (1) TABLET three (3) times each day.) 90 tablet 5  . medroxyPROGESTERone (DEPO-PROVERA) 150 MG/ML injection Inject 150 mg into the muscle every 3 (three) months.    . ondansetron (ZOFRAN-ODT) 4 MG disintegrating tablet Take 1 tablet (4 mg total)  by mouth every 8 (eight) hours as needed for nausea. 30 tablet 11  . promethazine (PHENERGAN) 25 MG tablet Take 1 tablet (25 mg total) by mouth every 8 (eight) hours as needed for nausea or vomiting. 30 tablet 1  . ranitidine (ZANTAC) 150 MG tablet Take 150 mg by mouth daily.    . SUMAtriptan Succinate (ONZETRA XSAIL) 11 MG/NOSEPC EXHP Place 2 sprays into the nose once. 6 each 11  . tiZANidine (ZANAFLEX) 4 MG tablet TAKE ONE TABLET EVERY EIGHT (8) HOURS ASNEEDED FOR MUSCLE SPASMS 30 tablet 0  . vitamin B-12 (CYANOCOBALAMIN) 1000 MCG tablet Take 1,000 mcg by mouth daily.    Marland Kitchen zolmitriptan (ZOMIG) 5 MG nasal solution Place 1 spray into the nose as needed for migraine. 6 Units 0   No current facility-administered medications on file prior to visit.    Allergies  Allergen Reactions  . Depakote [Divalproex Sodium] Other (See Comments)    Hair loss  . Erythromycin Base Swelling  . Seroquel [Quetiapine Fumarate] Other (See Comments)    irritable  . Tramadol   . Ziprasidone Hcl Other (See Comments)    Heat/cold intolerance.   . Azithromycin Rash  . Penicillins Rash  . Pregabalin Palpitations    Manic symptoms, no sleep x's 3 days, no appetite.  . Sulfa Antibiotics Rash    Past Medical History  Diagnosis Date  .  Bipolar disorder   . Fibromyalgia     on disability  . Migraine   . Panic attack   . Muscle spasm   . Uterine perforation     hx of  . Syncope     recurrent  . POTS (postural orthostatic tachycardia syndrome)   . Pain of right side of body     chronic  . Arthritis     Past Surgical History  Procedure Laterality Date  . Dilation and curettage of uterus    . Cataract extraction  dec 2014  . Lasik  dec 2014    Family History  Problem Relation Age of Onset  . Diabetes Mother   . Hypertension Mother   . Cancer Father     brain tumor  . Cancer Maternal Grandmother   . Cancer Maternal Grandfather   . Cancer Paternal Grandmother   . Cancer Paternal Grandfather     . Migraines Neg Hx     Social History   Social History  . Marital Status: Married    Spouse Name: Izell [place]  . Number of Children: 0  . Years of Education: HS/college   Occupational History  .  Other    disabled   Social History Main Topics  . Smoking status: Never Smoker   . Smokeless tobacco: Never Used  . Alcohol Use: No     Comment: 1 holiday drink yearly  . Drug Use: No  . Sexual Activity: Not on file   Other Topics Concern  . Not on file   Social History Narrative   Patient is married Izell [place]) and lives at home with her husband.   Married 20+ years   From [place]   On disability for fibromyalgia   Caffeine Use: none       The PMH, PSH, Social History, Family History, Medications, and allergies have been reviewed in [facility], and have been updated if relevant.   Review of Systems  Constitutional: Negative.   Respiratory: Negative.   Cardiovascular: Negative.   Gastrointestinal: Negative.   Endocrine: Negative.   Genitourinary: Negative.   Musculoskeletal: Positive for myalgias.  Skin: Negative.   Allergic/Immunologic: Negative.   Neurological: Positive for headaches.  Hematological: Negative.   Psychiatric/Behavioral: Negative.   All other systems reviewed and are negative.      Objective:    BP 102/68 mmHg  Pulse 101  Temp(Src) 98.3 F (36.8 C) (Oral)  Wt 176 lb (79.833 kg)  SpO2 97%   Physical Exam  Constitutional: She is oriented to person, place, and time. She appears well-developed and well-nourished. No distress.  HENT:  Head: Normocephalic and atraumatic.  Eyes: Conjunctivae are normal.  Neck: Normal range of motion.  Cardiovascular: Normal rate, regular rhythm and normal heart sounds.   Pulmonary/Chest: Effort normal and breath sounds normal. No respiratory distress. She has no wheezes. She has no rales. She exhibits no tenderness.  Musculoskeletal: Normal range of motion. She exhibits no edema.  Neurological: She is alert and oriented to  person, place, and time. No cranial nerve deficit.  Skin: Skin is warm and dry.  Psychiatric: She has a normal mood and affect. Her behavior is normal. Judgment and thought content normal.  tearful  Nursing note and vitals reviewed.         Assessment & Plan:   Fibromyalgia  Urinary urgency - Plan: Urinalysis Dipstick No Follow-up on file.

## 2015-03-12 NOTE — Telephone Encounter (Signed)
Patient's husband called with concerns on patient's pain management plan.  He states they were instructed at 9/19 OV to contact the office if pain was not improving.  Patient called yesterday morning to update you on her condition.  Pain was not changing.  She was advised per your telephone note to allow more time for improvement.  She is frustrated as she feels this is not what was discussed at the appointment.  An appointment was scheduled for this morning with you to address the unchanged pain and new symptoms that are occuring.  The patient would like to keep this appointment to discuss a new plan of care and to be sure she is aware of all instructions per your recommendations without the barrier of a third party.  She is unaware of pain management referral at this time, will discuss at [place].

## 2015-03-12 NOTE — Telephone Encounter (Signed)
If she is truly unable to urinate, needs to go to ER.  We unfortunately cannot cath her here.  I will place referral for pain management now.

## 2015-03-12 NOTE — Assessment & Plan Note (Signed)
>  25 minutes spent in face to face time with patient, >50% spent in counselling or coordination of care Expressed to her again today that I know she is frustrated with her pain level.  I do not manage chronic pain which is why I am referring her back to the pain clinic.  In the mean time, I do want her to stop taking Tramadol as it could be causing her urinary symptoms.  UA negative. Given rx for Norco- will only refill until her first appt with pain management. The patient indicates understanding of these issues and agrees with the plan.

## 2015-03-31 ENCOUNTER — Other Ambulatory Visit: Payer: Self-pay | Admitting: *Deleted

## 2015-03-31 NOTE — Telephone Encounter (Signed)
Last f/u 02/2015 

## 2015-04-01 MED ORDER — TIZANIDINE HCL 4 MG PO TABS: ORAL_TABLET | ORAL | Status: DC

## 2015-04-01 MED ORDER — CLONAZEPAM 1 MG PO TABS: ORAL_TABLET | ORAL | Status: DC

## 2015-04-01 NOTE — Telephone Encounter (Signed)
Rx called to pharmacy as instructed. 

## 2015-04-25 ENCOUNTER — Other Ambulatory Visit: Payer: Self-pay | Admitting: Family Medicine

## 2015-04-25 NOTE — Telephone Encounter (Signed)
Rx called in to requested pharmacy 

## 2015-04-25 NOTE — Telephone Encounter (Signed)
Last f/u 02/2015 

## 2015-05-20 ENCOUNTER — Telehealth: Payer: Self-pay

## 2015-05-20 DIAGNOSIS — F314 Bipolar disorder, current episode depressed, severe, without psychotic features: Secondary | ICD-10-CM

## 2015-05-20 NOTE — Telephone Encounter (Signed)
Pt left v/m requesting psychiatric referral to [facility] psychiatric [facility] Faheem. Pt last seen 03/10/15.Please advise.

## 2015-05-20 NOTE — Telephone Encounter (Signed)
Referral placed.

## 2015-05-26 ENCOUNTER — Other Ambulatory Visit: Payer: Self-pay | Admitting: Family Medicine

## 2015-05-26 NOTE — Telephone Encounter (Signed)
rx called in to requested pharmacy 

## 2015-05-26 NOTE — Telephone Encounter (Signed)
Last f/u 02/2015 

## 2015-06-02 ENCOUNTER — Encounter: Payer: Self-pay | Admitting: Anesthesiology

## 2015-06-02 ENCOUNTER — Ambulatory Visit: Payer: PPO | Attending: Anesthesiology | Admitting: Anesthesiology

## 2015-06-02 VITALS — BP 117/62 | HR 88 | Temp 98.4°F | Resp 16 | Ht 69.0 in | Wt 170.0 lb

## 2015-06-02 DIAGNOSIS — F319 Bipolar disorder, unspecified: Secondary | ICD-10-CM | POA: Diagnosis not present

## 2015-06-02 DIAGNOSIS — K589 Irritable bowel syndrome without diarrhea: Secondary | ICD-10-CM | POA: Diagnosis not present

## 2015-06-02 DIAGNOSIS — M545 Low back pain: Secondary | ICD-10-CM | POA: Insufficient documentation

## 2015-06-02 DIAGNOSIS — A1801 Tuberculosis of spine: Secondary | ICD-10-CM | POA: Insufficient documentation

## 2015-06-02 DIAGNOSIS — G8929 Other chronic pain: Secondary | ICD-10-CM | POA: Insufficient documentation

## 2015-06-02 DIAGNOSIS — M5136 Other intervertebral disc degeneration, lumbar region: Secondary | ICD-10-CM | POA: Insufficient documentation

## 2015-06-02 DIAGNOSIS — M479 Spondylosis, unspecified: Secondary | ICD-10-CM | POA: Insufficient documentation

## 2015-06-02 DIAGNOSIS — M797 Fibromyalgia: Secondary | ICD-10-CM | POA: Diagnosis present

## 2015-06-02 DIAGNOSIS — G894 Chronic pain syndrome: Secondary | ICD-10-CM

## 2015-06-02 DIAGNOSIS — M8580 Other specified disorders of bone density and structure, unspecified site: Secondary | ICD-10-CM

## 2015-06-02 MED ORDER — MEXILETINE HCL 150 MG PO CAPS: 150.0000 mg | ORAL_CAPSULE | Freq: Three times a day (TID) | ORAL | Status: DC

## 2015-06-02 NOTE — Progress Notes (Signed)
Safety precautions to be maintained throughout the outpatient stay will include: orient to surroundings, keep bed in low position, maintain call bell within reach at all times, provide assistance with transfer out of bed and ambulation.  

## 2015-06-02 NOTE — Patient Instructions (Signed)
A prescription for Mexitil was sent to your pharmacy.

## 2015-06-03 ENCOUNTER — Encounter: Payer: Self-pay | Admitting: Family Medicine

## 2015-06-03 ENCOUNTER — Emergency Department (HOSPITAL_COMMUNITY)
Admission: EM | Admit: 2015-06-03 | Discharge: 2015-06-03 | Disposition: A | Discharge status: Home / Self Care | Payer: PPO | Attending: Emergency Medicine | Admitting: Emergency Medicine

## 2015-06-03 ENCOUNTER — Encounter (HOSPITAL_COMMUNITY): Payer: Self-pay | Admitting: Emergency Medicine

## 2015-06-03 ENCOUNTER — Ambulatory Visit (INDEPENDENT_AMBULATORY_CARE_PROVIDER_SITE_OTHER): Payer: PPO | Admitting: Family Medicine

## 2015-06-03 VITALS — BP 128/52 | HR 81 | Temp 98.0°F | Wt 176.2 lb

## 2015-06-03 DIAGNOSIS — M797 Fibromyalgia: Secondary | ICD-10-CM | POA: Diagnosis not present

## 2015-06-03 DIAGNOSIS — Z3042 Encounter for surveillance of injectable contraceptive: Secondary | ICD-10-CM

## 2015-06-03 DIAGNOSIS — G43801 Other migraine, not intractable, with status migrainosus: Secondary | ICD-10-CM

## 2015-06-03 DIAGNOSIS — Z880 Allergy status to penicillin: Secondary | ICD-10-CM | POA: Diagnosis not present

## 2015-06-03 DIAGNOSIS — F314 Bipolar disorder, current episode depressed, severe, without psychotic features: Secondary | ICD-10-CM

## 2015-06-03 DIAGNOSIS — F410 Panic disorder [episodic paroxysmal anxiety] without agoraphobia: Secondary | ICD-10-CM | POA: Diagnosis not present

## 2015-06-03 DIAGNOSIS — F319 Bipolar disorder, unspecified: Secondary | ICD-10-CM | POA: Diagnosis not present

## 2015-06-03 DIAGNOSIS — I951 Orthostatic hypotension: Secondary | ICD-10-CM | POA: Diagnosis not present

## 2015-06-03 DIAGNOSIS — G90A Postural orthostatic tachycardia syndrome (POTS): Secondary | ICD-10-CM

## 2015-06-03 DIAGNOSIS — G43909 Migraine, unspecified, not intractable, without status migrainosus: Secondary | ICD-10-CM | POA: Insufficient documentation

## 2015-06-03 DIAGNOSIS — R000 Tachycardia, unspecified: Secondary | ICD-10-CM

## 2015-06-03 DIAGNOSIS — Z79899 Other long term (current) drug therapy: Secondary | ICD-10-CM | POA: Insufficient documentation

## 2015-06-03 DIAGNOSIS — R55 Syncope and collapse: Secondary | ICD-10-CM | POA: Diagnosis present

## 2015-06-03 LAB — BASIC METABOLIC PANEL
Anion gap: 9 (ref 5–15)
BUN: 10 mg/dL (ref 6–20)
CALCIUM: 9.3 mg/dL (ref 8.9–10.3)
CO2: 22 mmol/L (ref 22–32)
Chloride: 109 mmol/L (ref 101–111)
Creatinine, Ser: 1.06 mg/dL — ABNORMAL HIGH (ref 0.44–1.00)
GFR calc Af Amer: >60 mL/min (ref >60)
GFR calc non Af Amer: >60 mL/min (ref >60)
Glucose, Bld: 107 mg/dL — ABNORMAL HIGH (ref 65–99)
Potassium: 4 mmol/L (ref 3.5–5.1)
Sodium: 140 mmol/L (ref 135–145)

## 2015-06-03 LAB — CBC
HCT: 44.2 % (ref 36.0–46.0)
Hemoglobin: 14.6 g/dL (ref 12.0–15.0)
MCH: 27.8 pg (ref 26.0–34.0)
MCHC: 33 g/dL (ref 30.0–36.0)
MCV: 84 fL (ref 78.0–100.0)
PLATELETS: 245 10*3/uL (ref 150–400)
RBC: 5.26 MIL/uL — AB (ref 3.87–5.11)
RDW: 14.1 % (ref 11.5–15.5)
WBC: 6.8 10*3/uL (ref 4.0–10.5)

## 2015-06-03 MED ORDER — SODIUM CHLORIDE 0.9 % IV BOLUS (SEPSIS)
1000.0000 mL | Freq: Once | INTRAVENOUS | Status: AC
  Administered 2015-06-03: 1000 mL via INTRAVENOUS

## 2015-06-03 MED ORDER — MEDROXYPROGESTERONE ACETATE 150 MG/ML IM SUSP
150.0000 mg | Freq: Once | INTRAMUSCULAR | Status: AC
  Administered 2015-06-03: 150 mg via INTRAMUSCULAR

## 2015-06-03 MED ORDER — ACETAMINOPHEN-CODEINE #3 300-30 MG PO TABS
1.0000 | ORAL_TABLET | Freq: Once | ORAL | Status: AC
  Administered 2015-06-03: 1 via ORAL
  Filled 2015-06-03: qty 1

## 2015-06-03 MED ORDER — HYDROCODONE-ACETAMINOPHEN 5-325 MG PO TABS: 1.0000 | ORAL_TABLET | Freq: Four times a day (QID) | ORAL | Status: DC | PRN

## 2015-06-03 MED ORDER — ONDANSETRON HCL 4 MG/2ML IJ SOLN
4.0000 mg | Freq: Once | INTRAMUSCULAR | Status: AC
  Administered 2015-06-03: 4 mg via INTRAVENOUS
  Filled 2015-06-03: qty 2

## 2015-06-03 MED ORDER — ONDANSETRON 4 MG PO TBDP: ORAL_TABLET | ORAL | Status: DC

## 2015-06-03 NOTE — Progress Notes (Signed)
Pre visit review using our clinic review tool, if applicable. No additional management support is needed unless otherwise documented below in the visit note. 

## 2015-06-03 NOTE — Assessment & Plan Note (Signed)
IM depo given today. 

## 2015-06-03 NOTE — Discharge Instructions (Signed)
Please follow with your primary care doctor in the next 2 days for a check-up. They must obtain records for further management.   Do not hesitate to return to the Emergency Department for any new, worsening or concerning symptoms.    Postural Orthostatic Tachycardia Syndrome Postural orthostatic tachycardia syndrome (POTS) is an increased heart rate when going from a lying (supine) position to a standing position. The heart rate may increase more than 30 beats per minute (BPM) above its resting rate when going from a lying to a standing position. POTS occurs more frequently in women than in men.  SYMPTOMS  POTS symptoms may be increased in the morning. Symptoms of POTS include:  Fainting or near fainting.  Inability to think clearly.  Extreme or chronic fatigue.  Exercise intolerance.  Chest pain.  Having the lower legs develop a reddish-blue color due to decreased blood flow (acrocyanosis). CAUSES POTS can be caused by different conditions. Sometimes, it has no known cause (idiopathic). Some causes of POTS include:  Viral illness.  Pregnancy.  Autoimmune diseases.  Medications.  Major surgery.  Trauma such as a car accident or major injury.  Medical conditions such as anemia, dehydration, and hyperthyroidism. DIAGNOSIS  POTS is diagnosed by:  Taking a complete history and physical exam.  Measuring the heart rate while lying and then upon standing.  Measuring blood pressure when going from a lying to a standing position. POTS is usually not associated with low blood pressure (orthostatic hypotension) when going from a lying to standing position. While standing, blood pressure should be taken 2, 5, and 10 minutes after getting up. TREATMENT  Treatment of POTS depends upon the severity of the symptoms. Treatment includes:  Drinking plenty of fluids to avoid getting dehydrated.  Avoiding very hot environments to not get overheated.  Increasing your dietary salt intake  as instructed by your caregiver.  Taking different types of medications as prescribed for POTS.  Avoiding some classes of medications such as vasodilators and diuretics. SEEK IMMEDIATE MEDICAL CARE IF  You have severe chest pain that does not go away. Call your local emergency service immediately.  You feel your heart racing or beating rapidly.  You feel like passing out.  You have very confused thinking. MAKE SURE YOU  Understand these instructions.  Will watch your condition.  Will get help right away if you are not doing well or get worse.   This information is not intended to replace advice given to you by your health care provider. Make sure you discuss any questions you have with your health care provider.   Document Released: 05/28/2002 Document Revised: 06/28/2014 Document Reviewed: 08/05/2010 Elsevier Interactive Patient Education [employer].

## 2015-06-03 NOTE — ED Notes (Addendum)
Pt reports syncopal episode on Friday and near-syncope at this time. Was seen at PCP and was told she needed IV fluids. Emesis x1 today. C/o fibromyalgia related pain. No other c/c. Denies chest pain but says "I'm always short of breath but they can't find anything wrong." Has eaten today. No other c/c. Had Depakote shot today.

## 2015-06-03 NOTE — ED Provider Notes (Signed)
CSN: [id number]     Arrival date & time 06/03/15  1251 History   First MD Initiated Contact with Patient 06/03/15 1503     Chief Complaint  Patient presents with  . Near Syncope  . Nausea  . Fibromyalgia     (Consider location/radiation/quality/duration/timing/severity/associated sxs/prior Treatment) HPI  Blood pressure 112/74, pulse 95, temperature 97.8 F (36.6 C), temperature source Oral, resp. rate 18, SpO2 98 %.  Melissa [name] is a 44 y.o. female  with past medical history significant for bipolar, fibromyalgia, POTS complaining of syncopal events occurring 3 days ago and 2 syncopal events today. She also had 2 episodes of nonbloody, nonbilious, no coffee-ground emesis and she has a global headaches which she attributes to being "pissed off about the wait time in the ED" patient denies fever, chills, abdominal pain, diarrhea, change in urination, chest pain, shortness of breath.   Past Medical History  Diagnosis Date  . Bipolar disorder ([place])   . Fibromyalgia     on disability  . Migraine   . Panic attack   . Muscle spasm   . Uterine perforation     hx of  . Syncope     recurrent  . POTS (postural orthostatic tachycardia syndrome)   . Pain of right side of body     chronic  . Arthritis    Past Surgical History  Procedure Laterality Date  . Dilation and curettage of uterus    . Cataract extraction  dec 2014  . Lasik  dec 2014   Family History  Problem Relation Age of Onset  . Diabetes Mother   . Hypertension Mother   . Cancer Father     brain tumor  . Cancer Maternal Grandmother   . Cancer Maternal Grandfather   . Cancer Paternal Grandmother   . Cancer Paternal Grandfather   . Migraines Neg Hx    Social History  Substance Use Topics  . Smoking status: Never Smoker   . Smokeless tobacco: Never Used  . Alcohol Use: No     Comment: 1 holiday drink yearly   OB History    No data available     Review of Systems  10 systems reviewed and found to be  negative, except as noted in the HPI.  Allergies  Depakote; Erythromycin base; Lamotrigine; Seroquel; Tetanus toxoids; Tramadol; Ziprasidone hcl; Azithromycin; Lithium; Penicillins; Pregabalin; and Sulfa antibiotics  Home Medications   Prior to Admission medications   Medication Sig Start Date End Date Taking? Authorizing Provider  acetaminophen (TYLENOL) 500 MG tablet Take 1,000 mg by mouth every 6 (six) hours as needed for moderate pain.   Yes Historical Provider, MD  Cholecalciferol (VITAMIN D3) 1000 UNITS CAPS Take 3,000 Units by mouth daily.    Yes Historical Provider, MD  clonazePAM (KLONOPIN) 1 MG tablet TAKE ONE TABLET TWICE DAILY AS NEEDED FOR ANXIETY 05/26/15  Yes Lucille Passy, MD  fexofenadine (ALLEGRA) 180 MG tablet Take 180 mg by mouth daily.   Yes Historical Provider, MD  gabapentin (NEURONTIN) 600 MG tablet TAKE ONE (1) TABLET THREE (3) [place] Patient taking differently: Take one (1) TABLET three (3) times each day. 11/27/14  Yes Lucille Passy, MD  HYDROcodone-acetaminophen (NORCO) 5-325 MG tablet Take 1 tablet by mouth every 6 (six) hours as needed for moderate pain. 06/03/15  Yes Lucille Passy, MD  medroxyPROGESTERone (DEPO-PROVERA) 150 MG/ML injection Inject 150 mg into the muscle every 3 (three) months.   Yes Historical Provider, MD  mexiletine (MEXITIL) 150 MG capsule Take 1 capsule (150 mg total) by mouth 3 (three) times daily. 06/02/15  Yes Lance Bosch, MD  ondansetron (ZOFRAN-ODT) 4 MG disintegrating tablet Take 1 tablet (4 mg total) by mouth every 8 (eight) hours as needed for nausea. 02/25/15  Yes Melvenia Beam, MD  ranitidine (ZANTAC) 150 MG tablet Take 150 mg by mouth daily.   Yes Historical Provider, MD  SUMAtriptan Succinate (ONZETRA XSAIL) 11 MG/NOSEPC EXHP Place 2 sprays into the nose once. 02/25/15  Yes Melvenia Beam, MD  tiZANidine (ZANAFLEX) 4 MG tablet TAKE 1 TABLET BY MOUTH EVERY 8 HOURS AS NEEDED FOR MUSCLE SPASMS 05/26/15  Yes Lucille Passy, MD  vitamin  B-12 (CYANOCOBALAMIN) 1000 MCG tablet Take 1,000 mcg by mouth daily.   Yes Historical Provider, MD   BP 132/81 mmHg  Pulse 95  Temp(Src) 97.8 F (36.6 C) (Oral)  Resp 18  SpO2 100% Physical Exam  Constitutional: She is oriented to person, place, and time. She appears well-developed and well-nourished. No distress.  HENT:  Head: Normocephalic and atraumatic.  Mouth/Throat: Oropharynx is clear and moist.  Eyes: Conjunctivae and EOM are normal. Pupils are equal, round, and reactive to light.  No TTP of maxillary or frontal sinuses  No TTP or induration of temporal arteries bilaterally  Neck: Normal range of motion. Neck supple.  FROM to C-spine. Pt can touch chin to chest without discomfort. No TTP of midline cervical spine.   Cardiovascular: Normal rate, regular rhythm and intact distal pulses.   Pulmonary/Chest: Effort normal and breath sounds normal. No respiratory distress. She has no wheezes. She has no rales. She exhibits no tenderness.  Abdominal: Soft. Bowel sounds are normal. There is no tenderness.  Musculoskeletal: Normal range of motion. She exhibits no edema or tenderness.  Neurological: She is alert and oriented to person, place, and time. No cranial nerve deficit.  Follows commands, Clear, goal oriented speech, Strength is 5 out of 5x4 extremities, patient ambulates with a coordinated in nonantalgic gait. Sensation is grossly intact.    Skin: She is not diaphoretic.  Psychiatric: She has a normal mood and affect.  Nursing note and vitals reviewed.   ED Course  Procedures (including critical care time) Labs Review Labs Reviewed  BASIC METABOLIC PANEL - Abnormal; Notable for the following:    Glucose, Bld 107 (*)    Creatinine, Ser 1.06 (*)    All other components within normal limits  CBC - Abnormal; Notable for the following:    RBC 5.26 (*)    All other components within normal limits    Imaging Review No results found. I have personally reviewed and  evaluated these images and lab results as part of my medical decision-making.   EKG Interpretation None      MDM   Final diagnoses:  None    Filed Vitals:   06/03/15 1258 06/03/15 1530  BP: 132/81 112/74  Pulse: 95 95  Temp: 97.8 F (36.6 C)   TempSrc: Oral   Resp: 18 18  SpO2: 100% 98%    Medications  sodium chloride 0.9 % bolus 1,000 mL (not administered)  ondansetron (ZOFRAN) injection 4 mg (not administered)  acetaminophen-codeine (TYLENOL #3) 300-30 MG per tablet 1-2 tablet (not administered)    Melissa [name] is 44 y.o. female presenting with multiple episodes of syncope consistent with her prior POTS exacerbations. Patient had several episodes of emesis she denies abdominal pain. Abdominal exam is benign. She reports a headache which  she states is secondary to her anger at having to wait. Neuro exam nonfocal. EKG and blood work reassuring.   Patient reports subjective improvement, tolerated by mouth and ambulated without issue.  Evaluation does not show pathology that would require ongoing emergent intervention or inpatient treatment. Pt is hemodynamically stable and mentating appropriately. Discussed findings and plan with patient/guardian, who agrees with care plan. All questions answered. Return precautions discussed and outpatient follow up given.        Monico Blitz, PA-C 06/03/15 1706  Leonard Schwartz, MD 06/04/15 1256

## 2015-06-03 NOTE — ED Notes (Signed)
Pt is upset that she is having to wait to see an MD. She will leave in 20 mins if not seen. Explained that it is busy and the MD are trying to get to the pts. Labs have been drawn and resulted. No IV started due to pt stating that she wants to leave.

## 2015-06-03 NOTE — Progress Notes (Signed)
Subjective:   Patient ID: Melissa [name], female    DOB: April 11, 1971, 44 y.o.   MRN: [id number]  GWENDOLYNE [name] is a pleasant 44 y.o. year old female who presents to clinic today with Follow-up and Injections  on 06/03/2015  HPI:  Fibromyalgia- seeing pain specialist now, Dr. Idelia Salm..  Just saw him yesterday and he prescribed Mexitil which she has not started taking yet.  She was encouraged by the OV. She has been on disability for fibromyalgia for years. Does still need occassional hydrocodone which I prescribe.  Migraines- has only had one since Dr. Jaynee Eagles placed her on [place] on 02/25/15- note reviewed. She is very pleased.  Due for Depo injection today.     Current Outpatient Prescriptions on File Prior to Visit  Medication Sig Dispense Refill  . Cholecalciferol (VITAMIN D3) 1000 UNITS CAPS Take 3,000 Units by mouth daily.     . clonazePAM (KLONOPIN) 1 MG tablet TAKE ONE TABLET TWICE DAILY AS NEEDED FOR ANXIETY 60 tablet 0  . fexofenadine (ALLEGRA) 180 MG tablet Take 180 mg by mouth daily.    Marland Kitchen gabapentin (NEURONTIN) 600 MG tablet TAKE ONE (1) TABLET THREE (3) TIMES EACH DAY (Patient taking differently: Take one (1) TABLET three (3) times each day.) 90 tablet 5  . HYDROcodone-acetaminophen (NORCO) 5-325 MG per tablet Take 1 tablet by mouth every 6 (six) hours as needed for moderate pain. 60 tablet 0  . medroxyPROGESTERone (DEPO-PROVERA) 150 MG/ML injection Inject 150 mg into the muscle every 3 (three) months.    . mexiletine (MEXITIL) 150 MG capsule Take 1 capsule (150 mg total) by mouth 3 (three) times daily. 90 capsule 0  . ondansetron (ZOFRAN-ODT) 4 MG disintegrating tablet Take 1 tablet (4 mg total) by mouth every 8 (eight) hours as needed for nausea. 30 tablet 11  . ranitidine (ZANTAC) 150 MG tablet Take 150 mg by mouth daily.    . SUMAtriptan Succinate (ONZETRA XSAIL) 11 MG/NOSEPC EXHP Place 2 sprays into the nose once. 6 each 11  . tiZANidine (ZANAFLEX) 4 MG tablet TAKE 1  TABLET BY MOUTH EVERY 8 HOURS AS NEEDED FOR MUSCLE SPASMS 30 tablet 0  . vitamin B-12 (CYANOCOBALAMIN) 1000 MCG tablet Take 1,000 mcg by mouth daily.     No current facility-administered medications on file prior to visit.    Allergies  Allergen Reactions  . Depakote [Divalproex Sodium] Other (See Comments)    Hair loss  . Erythromycin Base Swelling  . Seroquel [Quetiapine Fumarate] Other (See Comments)    irritable  . Tramadol   . Ziprasidone Hcl Other (See Comments)    Heat/cold intolerance.   . Azithromycin Rash  . Penicillins Rash  . Pregabalin Palpitations    Manic symptoms, no sleep x's 3 days, no appetite.  . Sulfa Antibiotics Rash    Past Medical History  Diagnosis Date  . Bipolar disorder ([place])   . Fibromyalgia     on disability  . Migraine   . Panic attack   . Muscle spasm   . Uterine perforation     hx of  . Syncope     recurrent  . POTS (postural orthostatic tachycardia syndrome)   . Pain of right side of body     chronic  . Arthritis     Past Surgical History  Procedure Laterality Date  . Dilation and curettage of uterus    . Cataract extraction  dec 2014  . Lasik  dec 2014    Family History  Problem Relation Age of Onset  . Diabetes Mother   . Hypertension Mother   . Cancer Father     brain tumor  . Cancer Maternal Grandmother   . Cancer Maternal Grandfather   . Cancer Paternal Grandmother   . Cancer Paternal Grandfather   . Migraines Neg Hx     Social History   Social History  . Marital Status: Married    Spouse Name: Melissa [name]  . Number of Children: 0  . Years of Education: HS/college   Occupational History  .  Other    disabled   Social History Main Topics  . Smoking status: Never Smoker   . Smokeless tobacco: Never Used  . Alcohol Use: No     Comment: 1 holiday drink yearly  . Drug Use: No  . Sexual Activity: Not on file   Other Topics Concern  . Not on file   Social History Narrative   Patient is married Melissa Marion) and  lives at home with her husband.   Married 20+ years   From [place]   On disability for fibromyalgia   Caffeine Use: none       The PMH, PSH, Social History, Family History, Medications, and allergies have been reviewed in [facility], and have been updated if relevant.   Review of Systems  Constitutional: Negative.   Respiratory: Negative.   Cardiovascular: Negative.   Gastrointestinal: Negative.   Endocrine: Negative.   Genitourinary: Negative.   Musculoskeletal: Positive for myalgias.  Skin: Negative.   Allergic/Immunologic: Negative.   Neurological: Positive for headaches.  Hematological: Negative.   Psychiatric/Behavioral: Negative.   All other systems reviewed and are negative.      Objective:    BP 128/52 mmHg  Pulse 81  Temp(Src) 98 F (36.7 C) (Oral)  Wt 176 lb 4 oz (79.946 kg)  SpO2 98%   Physical Exam  Constitutional: She is oriented to person, place, and time. She appears well-developed and well-nourished. No distress.  HENT:  Head: Normocephalic and atraumatic.  Eyes: Conjunctivae are normal.  Neck: Normal range of motion.  Cardiovascular: Normal rate, regular rhythm and normal heart sounds.   Pulmonary/Chest: Effort normal and breath sounds normal. No respiratory distress. She has no wheezes. She has no rales. She exhibits no tenderness.  Musculoskeletal: Normal range of motion. She exhibits no edema.  Neurological: She is alert and oriented to person, place, and time. No cranial nerve deficit.  Skin: Skin is warm and dry.  Psychiatric: She has a normal mood and affect. Her behavior is normal. Judgment and thought content normal.  Nursing note and vitals reviewed.         Assessment & Plan:   Fibromyalgia  Bipolar disorder, current episode depressed, severe, without psychotic features ([place])  Other migraine with status migrainosus, not intractable No Follow-up on file.

## 2015-06-03 NOTE — ED Notes (Signed)
Pt was able to ambulate appropriately in room and did not vomit after drinking water.

## 2015-06-03 NOTE — ED Notes (Signed)
Bed: WA22 Expected date:  Expected time:  Means of arrival:  Comments: Triage 1  

## 2015-06-03 NOTE — ED Notes (Signed)
Patient was alert, oriented and stable upon discharge. RN went over AVS and patient had no further questions.  

## 2015-06-03 NOTE — Assessment & Plan Note (Signed)
Improved with new nasal triptan. No changes made today. Continue to follow along with neurology.

## 2015-06-03 NOTE — Assessment & Plan Note (Signed)
Now followed by pain management. I did refill her narcotic today.

## 2015-06-04 ENCOUNTER — Ambulatory Visit: Payer: PPO | Admitting: Family Medicine

## 2015-06-08 NOTE — Progress Notes (Signed)
Subjective:    Patient ID: Melissa [name], female    DOB: 02/06/1971, 44 y.o.   MRN: [id number] This patient is a 44 year old lady who comes in complaining of pain everywhere  In her body She has been diagnosed with fibromyalgia and has been treated for that condition for over 20 years  Pain intensity rating Her subjective pain intensity rating is 80%  Pain medications Her pain medications include gabapentin 600 mg 3 times a day, hydrocodone extra strength Tylenol multiple opioids including Opana prescribed by the [facility] She has also had treatment with intravenous lidocaine  Infusions in the past  Other medications Other medications include Allegra vitamin B12 vitamin D 3 Klonopin ranitidine tizanidine 4 mg 3 times a day and Zofran  Allergies He is allergic to penicillin area azithromycin depakote Lyrica and tramadol  Past medical history Medical history is positive for fibromyalgia and irritable bowel syndrome and bipolar disorder 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   HENT: Negative.   Eyes: Negative.   Respiratory: Negative.   Cardiovascular: Negative.   Gastrointestinal: Negative.   Endocrine: Negative.   Genitourinary: Negative.   Musculoskeletal: Positive for myalgias, back pain, joint swelling, arthralgias and gait problem. Negative for neck pain and neck stiffness.  Skin: Negative.   Allergic/Immunologic: Negative.   Neurological: Negative.   Hematological: Negative.   Psychiatric/Behavioral: Negative.        Objective:   Physical Exam  Constitutional: She is oriented to person, place, and time. She appears well-developed and  well-nourished.  HENT:  Head: Normocephalic and atraumatic.  Right Ear: External ear normal.  Left Ear: External ear normal.  Nose: Nose normal.  Mouth/Throat: No oropharyngeal exudate.  Eyes: Conjunctivae and EOM are normal. Pupils are equal, round, and reactive to light. Right eye exhibits no discharge. Left eye exhibits no discharge. No scleral icterus.  Neck: Normal range of motion. Neck supple. No JVD present. No tracheal deviation present. No thyromegaly present.  Cardiovascular: Normal rate, regular rhythm, normal heart sounds and intact distal pulses.  Exam reveals no gallop and no friction rub.   No murmur heard. Pulmonary/Chest: Effort normal and breath sounds normal. No stridor. No respiratory distress. She has no wheezes. She has no rales. She exhibits no tenderness.  Abdominal: Soft. Bowel sounds are normal. She exhibits no distension and no mass. There is no tenderness. There is no rebound and no guarding.  Genitourinary:  Genitourinary examination was deferred  Musculoskeletal: She exhibits tenderness. She exhibits no edema.  Patient had fine tremor of his hands Range of motion was decreased in the lower extremities   Lymphadenopathy:    She has no cervical adenopathy.  Neurological: She is alert and oriented to person, place, and time. She has normal reflexes. She displays normal reflexes. No cranial nerve deficit. She exhibits normal muscle tone. Coordination normal.  Skin: Skin is warm and dry. No rash noted. No erythema. No pallor.  Psychiatric: She has a normal mood and affect. Her behavior is normal. Judgment and thought content normal.  Nursing note and vitals reviewed.         Assessment & Plan:  Assessment 1 chronic total body pain 2 fibromyalgia 3 osteoarthritis of the back 4 lumbar degenerative disc disease    Plan of management 1 since patient has had intravenous lidocaine infusion and since I'll be unavailable for the next 3 wes a dayand given 90  pills 2 we'll consider intravenous lidocaine infusion in the future 3 caudal epidural steroid injection for her back pain 4 Will follow up with her in 1 month   New patient        [place] M.D.

## 2015-06-09 ENCOUNTER — Telehealth: Payer: Self-pay

## 2015-06-09 NOTE — Telephone Encounter (Signed)
Pt is taking Mexiletine and she is worried about the side affects because her blood pressure is going up and her heart rate is it. Pt says she normally has a lower blood pressures and she is really concerned.

## 2015-06-09 NOTE — Telephone Encounter (Signed)
Melissa [name] spoke with patient and advised that she should stop taking the medication if it is causing her BP to increase.  Patient is in agreement with this and will speak with Dr Idelia Salm at her next appt.

## 2015-06-23 ENCOUNTER — Other Ambulatory Visit: Payer: Self-pay | Admitting: Family Medicine

## 2015-06-24 NOTE — Telephone Encounter (Signed)
Rx called in to requested pharmacy 

## 2015-06-24 NOTE — Telephone Encounter (Signed)
Last f/u 05/2015 

## 2015-06-26 ENCOUNTER — Ambulatory Visit (INDEPENDENT_AMBULATORY_CARE_PROVIDER_SITE_OTHER): Payer: PPO | Admitting: Licensed Clinical Social Worker

## 2015-06-26 DIAGNOSIS — F3130 Bipolar disorder, current episode depressed, mild or moderate severity, unspecified: Secondary | ICD-10-CM | POA: Diagnosis not present

## 2015-06-26 NOTE — Progress Notes (Signed)
Patient ID: Melissa [name], female   DOB: April 19, 1971, 45 y.o.   MRN: [id number] Patient:   Melissa [name]   DOB:   04-Aug-1970  MR Number:  [id number]  Location:  [facility] [address] [place] [address] Dept: [phone]           Date of Service:   06/26/2015  Start Time:   9a End Time:   10a  Provider/Observer:  Lubertha South Counselor       Billing Code/Service: [phone]  Behavioral Observation: Melissa [name]  presents as a 45 y.o.-year-old Caucasian Female who appeared her stated age. her dress was Appropriate and she was Casual and her manners were Appropriate to the situation.  There were not any physical disabilities noted.  she displayed an appropriate level of cooperation and motivation.    Interactions:    Active   Attention:   within normal limits  Memory:   within normal limits  Speech (Volume):  normal  Speech:   normal pitch and normal volume  Thought Process:  Coherent and Relevant  Though Content:  WNL  Orientation:   person, place, time/date and situation  Judgment:   Good  Planning:   Good  Affect:    Appropriate  Mood:    Depressed  Insight:   Good  Intelligence:   normal  Chief Complaint:     Chief Complaint  Patient presents with  . Anxiety  . 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 Raised by both parents.  Father died when she was 59.  Has a younger sister.  Has a great relationship with her mother. Reports that she was a nerd in school.  Was the [place].     Children/Grand-children:   0  Natural/Informal Support:                           Husband [id number]   Substance Use:  No concerns of substance abuse are reported.     Medical History:   Past Medical History  Diagnosis Date  . Bipolar disorder ([place])   . Fibromyalgia     on disability  . Migraine   . Panic attack   . Muscle spasm   . Uterine perforation     hx of  . Syncope     recurrent  . POTS (postural orthostatic tachycardia syndrome)   . Pain of right side of body  chronic  . Arthritis           Medication List       This list is accurate as of: 06/26/15  9:14 AM.  Always use your most recent med list.               acetaminophen 500 MG tablet  Commonly known as:  TYLENOL  Take 1,000 mg by mouth every 6 (six) hours as needed for moderate pain.     clonazePAM 1 MG tablet  Commonly known as:  KLONOPIN  TAKE ONE TABLET TWICE DAILY AS NEEDED FOR ANXIETY     fexofenadine 180 MG tablet  Commonly known as:  ALLEGRA  Take 180 mg by mouth daily.     gabapentin 600 MG tablet  Commonly known as:  NEURONTIN  TAKE ONE (1) TABLET THREE (3) TIMES EACH DAY     HYDROcodone-acetaminophen 5-325 MG tablet  Commonly known as:  NORCO  Take 1 tablet by mouth every 6 (six) hours as needed for moderate pain.     medroxyPROGESTERone 150 MG/ML injection  Commonly known as:  DEPO-PROVERA  Inject 150 mg into the muscle every 3 (three) months.     mexiletine 150 MG capsule  Commonly known as:  MEXITIL  Take 1 capsule (150 mg total) by mouth 3  (three) times daily.     ondansetron 4 MG disintegrating tablet  Commonly known as:  ZOFRAN ODT  4mg  ODT q4 hours prn nausea/vomit     ranitidine 150 MG tablet  Commonly known as:  ZANTAC  Take 150 mg by mouth daily.     SUMAtriptan Succinate 11 MG/NOSEPC Exhp  Commonly known as:  ONZETRA XSAIL  Place 2 sprays into the nose once.     tiZANidine 4 MG tablet  Commonly known as:  ZANAFLEX  TAKE 1 TABLET BY MOUTH EVERY 8 HOURS AS NEEDED FOR MUSCLE SPASMS     vitamin B-12 1000 MCG tablet  Commonly known as:  CYANOCOBALAMIN  Take 1,000 mcg by mouth daily.     Vitamin D3 1000 units Caps  Take 3,000 Units by mouth daily.              Sexual History:   History  Sexual Activity  . Sexual Activity: Not on file     Abuse/Trauma History: Denies    Psychiatric History:  Dr. Thurmond Butts at [facility]; doctor in [place] at [place] visit was at [employer]; discharged herself due to billing issues  Strengths:   Organization, prompt, honest   Recovery Goals:  "Try to get my anxiety level down, get some sleep, learn some coping skills."  Hobbies/Interests:               crochet   Challenges/Barriers: Temper, mother in law early stages of dementia    Family Med/Psych History:  Family History  Problem Relation Age of Onset  . Diabetes Mother   . Hypertension Mother   . Cancer Father     brain tumor  . Cancer Maternal Grandmother   . Cancer Maternal Grandfather   . Cancer Paternal Grandmother   . Cancer Paternal Grandfather   . Migraines Neg Hx     Risk of Suicide/Violence: low  History of Suicide/Violence:  Passive thoughts about over 10 years ago  Psychosis:   Denies   Diagnosis:    Bipolar I disorder, most recent episode depressed ([place])   Recommendation/Plan: Writer recommends Outpatient Therapy at least twice monthly to include  but not limited to individual, group and or family therapy.  Medication Management is also  recommended to assist with her mood.

## 2015-06-30 ENCOUNTER — Ambulatory Visit: Payer: PPO | Attending: Anesthesiology | Admitting: Anesthesiology

## 2015-06-30 ENCOUNTER — Encounter: Payer: Self-pay | Admitting: Anesthesiology

## 2015-06-30 VITALS — BP 136/81 | HR 87 | Temp 98.6°F | Resp 18

## 2015-06-30 DIAGNOSIS — M542 Cervicalgia: Secondary | ICD-10-CM

## 2015-06-30 DIAGNOSIS — G8929 Other chronic pain: Secondary | ICD-10-CM | POA: Diagnosis not present

## 2015-06-30 DIAGNOSIS — R52 Pain, unspecified: Secondary | ICD-10-CM

## 2015-06-30 DIAGNOSIS — M797 Fibromyalgia: Secondary | ICD-10-CM | POA: Diagnosis not present

## 2015-06-30 DIAGNOSIS — M79629 Pain in unspecified upper arm: Secondary | ICD-10-CM | POA: Diagnosis not present

## 2015-06-30 DIAGNOSIS — M545 Low back pain: Secondary | ICD-10-CM | POA: Diagnosis not present

## 2015-06-30 DIAGNOSIS — M159 Polyosteoarthritis, unspecified: Secondary | ICD-10-CM | POA: Diagnosis not present

## 2015-06-30 NOTE — Progress Notes (Signed)
   Subjective:    Patient ID: Melissa [name], female    DOB: 05/30/1971, 45 y.o.   MRN: [id number]  HPI this patient was initially seen by me about 1 month ago complaining of generalized pain and in particular pain in the neck and the right side She was diagnosed as having fibromyalgia Because of my impending departure I treated her with mexiletine 150 mg twice a day and she said that this medication causes significant hypertension in her and she did not seem to get very much relief from the mexiletine She expressed an interest in pursuing the intravenous lidocaine infusion She admits to being very weak from the fact that she has not eaten for the day Therefore I would reschedule her for an intravenous lidocaine infusion tomorrow   Review of Systems  Constitutional: Negative.   HENT: Negative.   Eyes: Negative.   Respiratory: Negative.   Cardiovascular: Negative.   Gastrointestinal: Negative.   Endocrine: Negative.   Genitourinary: Negative.   Musculoskeletal: Negative.   Skin: Negative.   Allergic/Immunologic: Negative.   Neurological: Negative.   Hematological: Negative.   Psychiatric/Behavioral: Negative.        Objective:   Physical Exam  Cardiovascular:  This patient appears to be in no particular distress Her subjective pain intensity rating is 70% Her blood pressure is 136/81 mmHg Her pulse is 87 bpm Equal and regular Heart sounds 1 and 2 were heard in all areas There were no audible murmurs Temperature is 98.36F Respirations are 18 breaths per minute SPO2 was 100% Chest is clinically clear There no adventitious sounds Abdomen is soft and nontender There is no palpable organomegaly There is no significant lymphadenopathy Pupils are equal and reactive Cranial nerves are intact There are no new neurological or musculoskeletal findings  Nursing note and vitals reviewed.         Assessment & Plan:   Impression 1 chronic generalized body pain 2  fibromyalgia 3 chronic neck and arm pain  Plan of management For  intravenous lidocaine infusion tomorrow    Established patient     level II   Lance Bosch M.D.

## 2015-06-30 NOTE — Progress Notes (Signed)
Safety precautions to be maintained throughout the outpatient stay will include: orient to surroundings, keep bed in low position, maintain call bell within reach at all times, provide assistance with transfer out of bed and ambulation.  

## 2015-07-01 ENCOUNTER — Encounter: Payer: Self-pay | Admitting: Anesthesiology

## 2015-07-01 ENCOUNTER — Ambulatory Visit: Payer: PPO | Attending: Anesthesiology | Admitting: Anesthesiology

## 2015-07-01 VITALS — BP 148/76 | HR 90 | Temp 97.5°F | Resp 16 | Ht 69.0 in | Wt 170.0 lb

## 2015-07-01 DIAGNOSIS — M797 Fibromyalgia: Secondary | ICD-10-CM | POA: Insufficient documentation

## 2015-07-01 DIAGNOSIS — M542 Cervicalgia: Secondary | ICD-10-CM

## 2015-07-01 DIAGNOSIS — R52 Pain, unspecified: Secondary | ICD-10-CM

## 2015-07-01 MED ORDER — LIDOCAINE IN D5W 4-5 MG/ML-% IV SOLN
INTRAVENOUS | Status: AC
  Filled 2015-07-01: qty 500

## 2015-07-01 MED ORDER — DIAZEPAM 5 MG PO TABS
ORAL_TABLET | ORAL | Status: AC
  Filled 2015-07-01: qty 2

## 2015-07-01 NOTE — Progress Notes (Signed)
0930 Valium 10 mg given PO

## 2015-07-01 NOTE — Progress Notes (Signed)
Safety precautions to be maintained throughout the outpatient stay will include: orient to surroundings, keep bed in low position, maintain call bell within reach at all times, provide assistance with transfer out of bed and ambulation.  1055 Inusion complete for 10 minutes. Called to patient room and pateints upper body is shaking. Pateint alert and oriented X 3. VS as charted. Dr. Idelia Salm called to patient room. Pateint denies any other symptoms. Orders given to increase fluids and observe patient for any new complaints/symptoms. EKG with NSR. 1108 No new symptoms or complaints. NSR. VSS. 1115 VSS. Shaking has decreased. Denies any new symptoms. 1131 up to BR to void. Tolerated well with no new complaints. Shaking has stopped as well. 1500cc IVF complete as ordered. VSS. 1138 Stood patient up to obtain BP. Was standing up less than 10 seconds and collapsed to floor. Help called. HR remained in the 90s. Patient came to spontaneously. Dr. Idelia Salm here. Monitors intact. o2 started. 1200 Dr. Idelia Salm in to see patient. VSS. No new complaints. NSR. 1214 Dr. Idelia Salm states pateint is ok for discharge home. Husband and patient given precautions. VSS.

## 2015-07-01 NOTE — Progress Notes (Signed)
   Subjective:    Patient ID: Melissa [name], female    DOB: Oct 26, 1970, 45 y.o.   MRN: [id number]  HPI    Review of Systems     Objective:   Physical Exam        Assessment & Plan:    Procedure Intravenous lidocaine infusion  Date of procedure  January 10th 2017  Informed consent was obtained and the patient appeared to accept and understand the benefits and risks of this procedure  Patient was taken into the procedure room and placed in the supine position Intravenous access was established The patient was attached to the customary monitors including automated blood pressure monitoring and pulse oximetry electrode cardiography and the patient was attended by a nurse in the room continuously The patient was cautioned about doing need to be on the lockout for tinnitus circumoral numbness and metallic taste in the mouth She was weighed and her weight was 78 kg The dose to lidocaine to be administered was 4 mg/kg and this turned out to be 312 mg which was added to 250 cc of 5% dextrose water 10 minutes prior to starting the infusion the patient was given 10 mg of diazepam orally and this was intended as a prophylaxis to any seizure perform activities that may occur following the lidocaine administration The infusion was began and the 312 mg of lidocaine into 50 cc of 5% dextrose water was administered over the period of one hour During the administration of the lidocaine and there were no side effects or any prodromal signs At the end of the infusion the patient was observed for one additional hour About half way during that observation stage the patient got slightly hypotensive and her pressure dropped to 98/57 mmHg; her pulse was about 96 bpm She began to have some  seizure type activities of her arms and legs Her tongue was quite dry and firmly and we determined that she may have been dehydrated All other vital signs were stable and she was alert and well oriented in time and  space I increased her fluids and give her an additional thousand cc of 5% dextrose water and her pressure came up to the normal preprocedure levels At the end of full she was back to normal She got up and was going to the bathroom with the nurses and she had a vasovagal episode and had to lie down At that time all her vital signs were stable and she was given oxygen and an additional 500 cc of fluids She indicated that she experiences this activity from time to time because she has a diagnosis of pots (postural orthostatic tachycardia syndrome) and that she usually has those experiences from time to time She was observed for an additional 30 minutes and during that time she was normotensive in her vital signs were stable There were no other complications or side effects to the intravenous lidocaine infusion. Patient was discharged home with her partner and I will follow-up with her in 1 month  Established patient      level II  Lance Bosch M.D.

## 2015-07-01 NOTE — Patient Instructions (Signed)

## 2015-07-02 ENCOUNTER — Telehealth: Payer: Self-pay | Admitting: *Deleted

## 2015-07-02 NOTE — Telephone Encounter (Signed)
Denies complications post procedure. 

## 2015-07-14 ENCOUNTER — Telehealth: Payer: Self-pay | Admitting: Family Medicine

## 2015-07-14 DIAGNOSIS — M797 Fibromyalgia: Secondary | ICD-10-CM

## 2015-07-14 NOTE — Telephone Encounter (Signed)
Noted  Referral placed.

## 2015-07-14 NOTE — Telephone Encounter (Signed)
Patient was referred to [place] at [facility].  She was very happy with him, but just received a bill from [facility] for almost $100 for a treatment room charge.  Patient can't afford the charge.  Patient wants to be referred to Dr.Fras at [facility] - phone [phone].  Patient saw Dr.Fras a year ago.  If Dr.Fras isn't still at the office, she can see anyone there.  She needs treatment for Lidocaine Infusion for fibromyalgia.

## 2015-07-17 ENCOUNTER — Telehealth: Payer: Self-pay | Admitting: Psychiatry

## 2015-07-17 ENCOUNTER — Ambulatory Visit (INDEPENDENT_AMBULATORY_CARE_PROVIDER_SITE_OTHER): Payer: PPO | Admitting: Psychiatry

## 2015-07-17 ENCOUNTER — Encounter: Payer: Self-pay | Admitting: Psychiatry

## 2015-07-17 VITALS — BP 120/78 | HR 98 | Temp 98.4°F | Ht 69.0 in | Wt 174.0 lb

## 2015-07-17 DIAGNOSIS — Z8739 Personal history of other diseases of the musculoskeletal system and connective tissue: Secondary | ICD-10-CM | POA: Diagnosis not present

## 2015-07-17 DIAGNOSIS — F3160 Bipolar disorder, current episode mixed, unspecified: Secondary | ICD-10-CM | POA: Diagnosis not present

## 2015-07-17 DIAGNOSIS — Z8659 Personal history of other mental and behavioral disorders: Secondary | ICD-10-CM | POA: Diagnosis not present

## 2015-07-17 MED ORDER — BREXPIPRAZOLE 1 MG PO TABS: 1.0000 mg | ORAL_TABLET | ORAL | Status: DC

## 2015-07-17 MED ORDER — BREXPIPRAZOLE 0.5 MG PO TABS: 0.5000 mg | ORAL_TABLET | ORAL | Status: DC

## 2015-07-17 MED ORDER — HYDROXYZINE PAMOATE 50 MG PO CAPS: 50.0000 mg | ORAL_CAPSULE | Freq: Every day | ORAL | Status: DC

## 2015-07-17 NOTE — Progress Notes (Signed)
Psychiatric Initial Adult Assessment   Patient Identification: Melissa [name] MRN:  [id number] Date of Evaluation:  07/17/2015 Referral Source: [facility] Chief Complaint:   Chief Complaint    Medication Refill; Establish Care; Anxiety; Insomnia; Depression; Fatigue     Visit Diagnosis:    ICD-9-CM ICD-10-CM   1. Bipolar I disorder, most recent episode mixed ([place]) 296.60 F31.60    Diagnosis:   Patient Active Problem List   Diagnosis Date Noted  . Urinary urgency [R39.15] 03/12/2015  . Acne [L70.9] 10/21/2014  . Liver lesion [K76.89] 09/09/2014  . Vitamin D deficiency [E55.9] 09/09/2014  . Hot flashes [N95.1] 06/10/2014  . Chronic pain syndrome [G89.4] 05/20/2014  . Vitamin B12 deficiency [E53.8] 03/12/2014  . Neck pain on right side [M54.2] 11/23/2013  . Pain of right side of body [R52] 11/15/2013  . Contraception management [Z30.9] 09/20/2013  . IBS (irritable bowel syndrome) [K58.9] 09/20/2013  . Unspecified vitamin D deficiency [E55.9] 04/03/2013  . HLD (hyperlipidemia) [E78.5] 04/03/2013  . Osteopenia [M85.80] 04/03/2013  . POTS (postural orthostatic tachycardia syndrome) [R00.0, I95.1] 02/26/2013  . Syncope [R55] 11/29/2012  . Migraine [G43.909] 11/29/2012  . Fibromyalgia [M79.7]   . Bipolar disorder ([place]) [F31.9]    History of Present Illness:   Patient is a 45 year old married female is currently on disability for fibromyalgia and has long history of bipolar disorder presented for initial assessment. She is referred by her primary care physician. Patient reported that she was following Dr. Delaney Meigs at [facility] for almost 15 years but she left the practice last year in May due to billing issues. Patient reported that she has not taken any medications except for Klonopin since her last year. Currently she is experiencing agitation and more swings anger anxiety and unable to control her symptoms. She reported that initially she was only taking Klonopin on a  when necessary basis but due to her worsening symptoms she has been taking Klonopin 3 times daily which is prescribed by her primary care physician. Patient reported that she is unable to sleep at night and not able to shut her mind off. She currently denied having any suicidal ideations or plans.  She reported that she is allergic to all the mood stabilizers including lithium lamotrigine Depakote Seroquel Risperdal Latuda and reported different directions to the medications. The only medication which has worked effectively his [place]. She is currently willing to try Rexulti.  T. She reported that she is also taking gabapentin. She is also on medications to help with her headaches as well as Zofran Patient was showing pressured speech during the interview. She currently denied having any suicidal homicidal ideations or plans.  Elements:  Severity:  moderate. Associated Signs/Symptoms: Depression Symptoms:  insomnia, psychomotor agitation, fatigue, difficulty concentrating, anxiety, (Hypo) Manic Symptoms:  Distractibility, Flight of Ideas, Impulsivity, Irritable Mood, Labiality of Mood, Anxiety Symptoms:  Excessive Worry, Psychotic Symptoms:  none PTSD Symptoms: Negative NA  Past Medical History:  Past Medical History  Diagnosis Date  . Bipolar disorder ([place])   . Fibromyalgia     on disability  . Migraine   . Panic attack   . Muscle spasm   . Uterine perforation     hx of  . Syncope     recurrent  . POTS (postural orthostatic tachycardia syndrome)   . Pain of right side of body     chronic  . Arthritis     Past Surgical History  Procedure Laterality Date  . Dilation and curettage of uterus    .  Cataract extraction  dec 2014  . Lasik  dec 2014   Family History:  Family History  Problem Relation Age of Onset  . Diabetes Mother   . Hypertension Mother   . Cancer Father     brain tumor  . Cancer Maternal Grandmother   . Cancer Maternal Grandfather   . Cancer  Paternal Grandmother   . Cancer Paternal Grandfather   . Migraines Neg Hx    Social History:   Social History   Social History  . Marital Status: Married    Spouse Name: Izell [place]  . Number of Children: 0  . Years of Education: HS/college   Occupational History  .  Other    disabled   Social History Main Topics  . Smoking status: Never Smoker   . Smokeless tobacco: Never Used  . Alcohol Use: No     Comment: 1 holiday drink yearly  . Drug Use: No  . Sexual Activity: Yes    Birth Control/ Protection: Injection   Other Topics Concern  . 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   Married 20+ years   From [place]   On disability for fibromyalgia   Caffeine Use: none       Additional Social History:  Married x 27 years. Does not have any children. Her husband is also on disability. She stays at home.  Musculoskeletal: Strength & Muscle Tone: within normal limits Gait & Station: normal Patient leans: N/A  Psychiatric Specialty Exam: HPI  ROS  Blood pressure 120/78, pulse 98, temperature 98.4 F (36.9 C), temperature source Tympanic, height 5\' 9"  (1.753 m), weight 174 lb (78.926 kg), SpO2 94 %.Body mass index is 25.68 kg/(m^2).  General Appearance: Casual and Fairly Groomed  Eye Contact:  Fair  Speech:  Pressured  Volume:  Increased  Mood:  Anxious  Affect:  Congruent  Thought Process:  Circumstantial  Orientation:  Full (Time, Place, and Person)  Thought Content:  WDL  Suicidal Thoughts:  No  Homicidal Thoughts:  No  Memory:  Immediate;   Fair  Judgement:  Intact  Insight:  Fair  Psychomotor Activity:  Normal  Concentration:  Fair  Recall:  [employer] of Knowledge:Fair  Language: Fair  Akathisia:  No  Handed:  Right  AIMS (if indicated):    Assets:  Communication Skills Physical Health Social Support  ADL's:  Intact  Cognition: WNL  Sleep:     Is the patient at risk to self?  No. Has the  patient been a risk to self in the past 6 months?  No. Has the patient been a risk to self within the distant past?  Yes.   Is the patient a risk to others?  No. Has the patient been a risk to others in the past 6 months?  No. Has the patient been a risk to others within the distant past?  No.  Allergies:   Allergies  Allergen Reactions  . Abilify [Aripiprazole] Other (See Comments)    Weight gain, did not help,   . Depakote [Divalproex Sodium] Other (See Comments)    Hair loss  . Erythromycin Base Swelling  . Lamotrigine Other (See Comments)    Weight gain, angioedema, hives  . Seroquel [Quetiapine Fumarate] Other (See Comments)    irritable  . Tetanus Toxoids     Other reaction(s): OTHER  . Tramadol   . Ziprasidone Hcl Other (See Comments)  Heat/cold intolerance.   . Azithromycin Rash  . Lithium Rash  . Penicillins Rash    Has patient had a PCN reaction causing immediate rash, facial/tongue/throat swelling, SOB or lightheadedness with hypotension: Yes- swelling, rash  Has patient had a PCN reaction causing severe rash involving mucus membranes or skin necrosis: Yes Has patient had a PCN reaction that required hospitalization No Has patient had a PCN reaction occurring within the last 10 years: No If all of the above answers are "NO", then may proceed with Cephalosporin use.   . Pregabalin Palpitations    Manic symptoms, no sleep x's 3 days, no appetite.  . Sulfa Antibiotics Rash   Current Medications: Current Outpatient Prescriptions  Medication Sig Dispense Refill  . acetaminophen (TYLENOL) 500 MG tablet Take 1,000 mg by mouth every 6 (six) hours as needed for moderate pain.    . Cholecalciferol (VITAMIN D3) 1000 UNITS CAPS Take 3,000 Units by mouth daily.     . clonazePAM (KLONOPIN) 1 MG tablet TAKE ONE TABLET TWICE DAILY AS NEEDED FOR ANXIETY 60 tablet 0  . fexofenadine (ALLEGRA) 180 MG tablet Take 180 mg by mouth daily.    [place] gabapentin (NEURONTIN) 600 MG tablet TAKE  ONE (1) TABLET THREE (3) TIMES EACH DAY 90 tablet 0  . medroxyPROGESTERone (DEPO-PROVERA) 150 MG/ML injection Inject 150 mg into the muscle every 3 (three) months.    . ondansetron (ZOFRAN ODT) 4 MG disintegrating tablet 4mg  ODT q4 hours prn nausea/vomit 15 tablet 0  . ranitidine (ZANTAC) 150 MG tablet Take 150 mg by mouth daily.    . SUMAtriptan Succinate (ONZETRA XSAIL) 11 MG/NOSEPC EXHP Place 2 sprays into the nose once. 6 each 11  . tiZANidine (ZANAFLEX) 4 MG tablet TAKE 1 TABLET BY MOUTH EVERY 8 HOURS AS NEEDED FOR MUSCLE SPASMS 30 tablet 0  . vitamin B-12 (CYANOCOBALAMIN) 1000 MCG tablet Take 1,000 mcg by mouth daily.    [place] HYDROcodone-acetaminophen (NORCO) 5-325 MG tablet Take 1 tablet by mouth every 6 (six) hours as needed for moderate pain. (Patient not taking: Reported on 07/17/2015) 60 tablet 0  . mexiletine (MEXITIL) 150 MG capsule Take 1 capsule (150 mg total) by mouth 3 (three) times daily. (Patient not taking: Reported on 07/17/2015) 90 capsule 0   No current facility-administered medications for this visit.    Previous Psychotropic Medications:  Depakote- Hair loss Lamotrigine- Dont remember Seroquel- ? Swelling, sweating Lithium- acne Geodon- cant remember Risperdal- dont remember Zyprexa-  Latuda- Shakes rexulti - not tried prozac- not doing well.   Seen Psychiatrist for 15 plus years. Lithium was helping in the past Saw Dr Delaney Meigs for many years at [facility] but had billing issues and stopped going to her practice last year.   Psychiatric Hospitalization [facility] x 2- Depressed, suicidal thoughts.  [facility] X1- Extremely agitated and paranoid      Substance Abuse History in the last 12 months:  No.  Consequences of Substance Abuse: Negative NA  Medical Decision Making:  Review of Psycho-Social Stressors (1) and Decision to obtain old records (1)  Treatment Plan Summary: Medication management   Advised patient that we needed [facility] records from [facility] as she has been a patient at that practice for almost 15 years. She has signed a release of information. I will start her Rexulti 0.5 mg for one week and will then titrate the dose to 1 mg daily. Discussed with her about the side effects of the medication in detail and she demonstrated understanding She  will also continue on Klonopin 1 mg 3 times a day which is prescribed by her primary care physician She will also continue on Vistaril 50 mg at bedtime to help with insomnia Discussed with her about the side effects of the medication Follow-up in 1 month   Rainey [place], MD    1/26/201711:06 AM

## 2015-07-25 ENCOUNTER — Other Ambulatory Visit: Payer: Self-pay | Admitting: Family Medicine

## 2015-07-25 NOTE — Telephone Encounter (Signed)
Last f/u 05/2015 

## 2015-07-25 NOTE — Telephone Encounter (Signed)
Rx called in to requested pharmacy 

## 2015-07-28 ENCOUNTER — Ambulatory Visit (INDEPENDENT_AMBULATORY_CARE_PROVIDER_SITE_OTHER): Payer: PPO | Admitting: Family Medicine

## 2015-07-28 ENCOUNTER — Encounter: Payer: Self-pay | Admitting: Family Medicine

## 2015-07-28 VITALS — BP 112/82 | HR 89 | Temp 98.2°F | Ht 69.0 in | Wt 172.8 lb

## 2015-07-28 DIAGNOSIS — M545 Low back pain, unspecified: Secondary | ICD-10-CM

## 2015-07-28 DIAGNOSIS — G894 Chronic pain syndrome: Secondary | ICD-10-CM | POA: Diagnosis not present

## 2015-07-28 DIAGNOSIS — M533 Sacrococcygeal disorders, not elsewhere classified: Secondary | ICD-10-CM

## 2015-07-28 DIAGNOSIS — F314 Bipolar disorder, current episode depressed, severe, without psychotic features: Secondary | ICD-10-CM

## 2015-07-28 DIAGNOSIS — M797 Fibromyalgia: Secondary | ICD-10-CM | POA: Diagnosis not present

## 2015-07-28 MED ORDER — DICLOFENAC SODIUM 75 MG PO TBEC: 75.0000 mg | DELAYED_RELEASE_TABLET | Freq: Two times a day (BID) | ORAL | Status: DC

## 2015-07-28 NOTE — Progress Notes (Signed)
Dr. Frederico Hamman T. Aalyssa Elderkin, MD, [facility] [place], [address] Phone: [phone] Fax: [phone]  07/28/2015  Patient: Melissa [name], MRN: [id number], DOB: 02-13-1971, 45 y.o.  Primary Physician:  Arnette Norris, MD   Chief Complaint  Patient presents with  . Back Pain    ?Pulled Muscle-started Friday night   Subjective:   Melissa [name] is a 45 y.o. very pleasant female patient who presents with the following:  Very pleasant patient who has a history of chronic pain, chronic back pain, fibromyalgia, and bipolar disorder who is currently taking Klonopin for this who has routine follow-up with psychiatry, who presents after an acute back injury on Friday evening. Since then she has had some quite significant severe pain in the lower aspect of her spine around L5 and S1 on either side. She also has some pain in the SI joints. At baseline she has some radiculopathy into her legs, and this is no different compared to her baseline sensation or pain sensations. No weakness. No bowel or bladder incontinence.  Could not get out of her chair - touble moving leg or back. Will catch with trying to do something - a lot better today.   Went to [employer] for for only about 20 mins.  Acute sudden pain.   Heat, Zanaflex. Has been taking a lot of Tylenol.   Past Medical History, Surgical History, Social History, Family History, Problem List, Medications, and Allergies have been reviewed and updated if relevant.  Patient Active Problem List   Diagnosis Date Noted  . Urinary urgency 03/12/2015  . Acne 10/21/2014  . Liver lesion 09/09/2014  . Vitamin D deficiency 09/09/2014  . Hot flashes 06/10/2014  . Chronic pain syndrome 05/20/2014  . Vitamin B12 deficiency 03/12/2014  . Neck pain on right side 11/23/2013  . Pain of right side of body 11/15/2013  . Contraception management 09/20/2013  . IBS (irritable bowel syndrome) 09/20/2013  .  Unspecified vitamin D deficiency 04/03/2013  . HLD (hyperlipidemia) 04/03/2013  . Osteopenia 04/03/2013  . POTS (postural orthostatic tachycardia syndrome) 02/26/2013  . Syncope 11/29/2012  . Migraine 11/29/2012  . Fibromyalgia   . Bipolar disorder [facility])     Past Medical History  Diagnosis Date  . Bipolar disorder ([place])   . Fibromyalgia     on disability  . Migraine   . Panic attack   . Muscle spasm   . Uterine perforation     hx of  . Syncope     recurrent  . POTS (postural orthostatic tachycardia syndrome)   . Pain of right side of body     chronic  . Arthritis     Past Surgical History  Procedure Laterality Date  . Dilation and curettage of uterus    . Cataract extraction  dec 2014  . Lasik  dec 2014    Social History   Social History  . Marital Status: Married    Spouse Name: Izell Montague  . Number of Children: 0  . Years of Education: HS/college   Occupational History  .  Other    disabled   Social History Main Topics  . Smoking status: Never Smoker   . Smokeless tobacco: Never Used  . Alcohol Use: No     Comment: 1 holiday drink yearly  . Drug Use: No  . Sexual Activity: Yes    Birth Control/ Protection: Injection   Other Topics Concern  . Not on  file   Social History Narrative   Patient is married Izell [place]) and lives at home with her husband.   Married 20+ years   From [place]   On disability for fibromyalgia   Caffeine Use: none        Family History  Problem Relation Age of Onset  . Diabetes Mother   . Hypertension Mother   . Cancer Father     brain tumor  . Cancer Maternal Grandmother   . Cancer Maternal Grandfather   . Cancer Paternal Grandmother   . Cancer Paternal Grandfather   . Migraines Neg Hx     Allergies  Allergen Reactions  . Abilify [Aripiprazole] Other (See Comments)    Weight gain, did not help,   . Depakote [Divalproex Sodium] Other (See Comments)    Hair loss  . Erythromycin Base Swelling  . Lamotrigine Other (See  Comments)    Weight gain, angioedema, hives  . Seroquel [Quetiapine Fumarate] Other (See Comments)    irritable  . Tetanus Toxoids     Other reaction(s): OTHER  . Tramadol   . Ziprasidone Hcl Other (See Comments)    Heat/cold intolerance.   . Azithromycin Rash  . Lithium Rash  . Penicillins Rash    Has patient had a PCN reaction causing immediate rash, facial/tongue/throat swelling, SOB or lightheadedness with hypotension: Yes- swelling, rash  Has patient had a PCN reaction causing severe rash involving mucus membranes or skin necrosis: Yes Has patient had a PCN reaction that required hospitalization No Has patient had a PCN reaction occurring within the last 10 years: No If all of the above answers are "NO", then may proceed with Cephalosporin use.   . Pregabalin Palpitations    Manic symptoms, no sleep x's 3 days, no appetite.  . Sulfa Antibiotics Rash    Medication list reviewed and updated in full in [facility].  GEN: no acute illness or fever CV: No chest pain or shortness of breath MSK: detailed above Neuro: neurological signs are described above ROS O/w per HPI  Objective:   BP 112/82 mmHg  Pulse 89  Temp(Src) 98.2 F (36.8 C) (Oral)  Ht 5\' 9"  (1.753 m)  Wt 172 lb 12 oz (78.359 kg)  BMI 25.50 kg/m2   GEN: Well-developed,well-nourished,in no acute distress; alert,appropriate and cooperative throughout examination HEENT: Normocephalic and atraumatic without obvious abnormalities. Ears, externally no deformities PULM: Breathing comfortably in no respiratory distress EXT: No clubbing, cyanosis, or edema PSYCH: Normally interactive. Cooperative during the interview. Pleasant. Friendly and conversant. Not anxious or depressed appearing. Normal, full affect.  Range of motion at  the waist: Flexion, extension, lateral bending and rotation: With some mild limitation of forward flexion and extension as well as lateral bending and rotation.  No echymosis or  edema Rises to examination table with mild difficulty Gait: minimally antalgic  Inspection/Deformity: N Paraspinus Tenderness: Modest tenderness from L2-S1. Notable and dramatic tenderness around S1.  B Ankle Dorsiflexion (L5,4): 5/5 B Great Toe Dorsiflexion (L5,4): 5/5 Heel Walk (L5): WNL Toe Walk (S1): WNL Rise/Squat (L4): WNL, mild pain  SENSORY B Medial Foot (L4): WNL B Dorsum (L5): WNL B Lateral (S1): WNL Light Touch: WNL Pinprick: WNL  REFLEXES Knee (L4): 2+ Ankle (S1): 2+  B SLR, seated: neg B SLR, supine: neg B FABER: + B Reverse FABER: neg B Greater Troch: NT B Log Roll: neg B Sciatic Notch: TTP  Radiology: No results found.  Assessment and Plan:   Acute bilateral low back pain without  sciatica  Fibromyalgia  Bipolar disorder, current episode depressed, severe, without psychotic features (HCC)  Chronic pain syndrome  Sacroiliac joint dysfunction of both sides  Continue basic range of motion, heat and massage. Add Voltaren by mouth twice a day.  Continue with Zanaflex by mouth at nighttime as well as Klonopin twice a day. Continue with gabapentin as well.  Follow-up: prn  New Prescriptions   DICLOFENAC (VOLTAREN) 75 MG EC TABLET    Take 1 tablet (75 mg total) by mouth 2 (two) times daily.   Signed,  Maud Deed. Loi Rennaker, MD   Patient's Medications  New Prescriptions   DICLOFENAC (VOLTAREN) 75 MG EC TABLET    Take 1 tablet (75 mg total) by mouth 2 (two) times daily.  Previous Medications   CHOLECALCIFEROL (VITAMIN D3) 1000 UNITS CAPS    Take 3,000 Units by mouth daily.    CLONAZEPAM (KLONOPIN) 1 MG TABLET    TAKE ONE TABLET TWICE DAILY AS NEEDED FOR ANXIETY   FEXOFENADINE (ALLEGRA) 180 MG TABLET    Take 180 mg by mouth daily.   GABAPENTIN (NEURONTIN) 600 MG TABLET    TAKE ONE (1) TABLET THREE (3) TIMES EACH DAY   HYDROCODONE-ACETAMINOPHEN (NORCO) 5-325 MG TABLET    Take 1 tablet by mouth every 6 (six) hours as needed for moderate pain.    MEDROXYPROGESTERONE (DEPO-PROVERA) 150 MG/ML INJECTION    Inject 150 mg into the muscle every 3 (three) months.   ONDANSETRON (ZOFRAN ODT) 4 MG DISINTEGRATING TABLET    4mg  ODT q4 hours prn nausea/vomit   RANITIDINE (ZANTAC) 150 MG TABLET    Take 150 mg by mouth daily.   TIZANIDINE (ZANAFLEX) 4 MG TABLET    TAKE 1 TABLET BY MOUTH EVERY 8 HOURS AS NEEDED FOR MUSCLE SPASMS   VITAMIN B-12 (CYANOCOBALAMIN) 1000 MCG TABLET    Take 1,000 mcg by mouth daily.  Modified Medications   No medications on file  Discontinued Medications   BREXPIPRAZOLE (REXULTI) 0.5 MG TABS    Take 0.5 mg by mouth 1 day or 1 dose. Samples given   BREXPIPRAZOLE (REXULTI) 1 MG TABS    Take 1 mg by mouth 1 day or 1 dose.   HYDROXYZINE (VISTARIL) 50 MG CAPSULE    Take 1 capsule (50 mg total) by mouth at bedtime.

## 2015-07-28 NOTE — Progress Notes (Signed)
Pre visit review using our clinic review tool, if applicable. No additional management support is needed unless otherwise documented below in the visit note. 

## 2015-07-29 ENCOUNTER — Ambulatory Visit: Payer: PPO | Admitting: Anesthesiology

## 2015-08-14 ENCOUNTER — Encounter: Payer: Self-pay | Admitting: Psychiatry

## 2015-08-14 ENCOUNTER — Ambulatory Visit (INDEPENDENT_AMBULATORY_CARE_PROVIDER_SITE_OTHER): Payer: PPO | Admitting: Psychiatry

## 2015-08-14 VITALS — BP 122/78 | Temp 99.2°F | Ht 69.0 in | Wt 175.6 lb

## 2015-08-14 DIAGNOSIS — F3160 Bipolar disorder, current episode mixed, unspecified: Secondary | ICD-10-CM | POA: Diagnosis not present

## 2015-08-14 DIAGNOSIS — H269 Unspecified cataract: Secondary | ICD-10-CM | POA: Insufficient documentation

## 2015-08-14 MED ORDER — RISPERIDONE 0.25 MG PO TABS: 0.5000 mg | ORAL_TABLET | Freq: Two times a day (BID) | ORAL | Status: DC

## 2015-08-14 NOTE — Progress Notes (Signed)
Psychiatric MD Follow up NOTE  Patient Identification: MARIEA KINGHORN MRN:  [id number] Date of Evaluation:  08/14/2015 Referral Source: [facility] Chief Complaint:   Chief Complaint    Follow-up; Medication Refill     Visit Diagnosis:    ICD-9-CM ICD-10-CM   1. Bipolar I disorder, most recent episode mixed ([place]) 296.60 F31.60    Diagnosis:   Patient Active Problem List   Diagnosis Date Noted  . Bilateral cataracts [H26.9] 08/14/2015  . Urinary urgency [R39.15] 03/12/2015  . Acne [L70.9] 10/21/2014  . Liver lesion [K76.89] 09/09/2014  . Vitamin D deficiency [E55.9] 09/09/2014  . Hot flashes [N95.1] 06/10/2014  . Chronic pain syndrome [G89.4] 05/20/2014  . Vitamin B12 deficiency [E53.8] 03/12/2014  . Neck pain on right side [M54.2] 11/23/2013  . Pain of right side of body [R52] 11/15/2013  . Contraception management [Z30.9] 09/20/2013  . IBS (irritable bowel syndrome) [K58.9] 09/20/2013  . Low back pain [M54.5] 07/05/2013  . Unspecified vitamin D deficiency [E55.9] 04/03/2013  . HLD (hyperlipidemia) [E78.5] 04/03/2013  . Osteopenia [M85.80] 04/03/2013  . Chest pain [R07.9] 03/07/2013  . POTS (postural orthostatic tachycardia syndrome) [R00.0, I95.1] 02/26/2013  . Intestinal infection [A08.8] 01/27/2013  . Syncope [R55] 11/29/2012  . Migraine [G43.909] 11/29/2012  . Allergic rhinitis [J30.9] 10/05/2012  . Mixed bipolar I disorder in remission ([place]) [F31.70] 10/05/2012  . Family planning [Z30.09] 10/05/2012  . Combined fat and carbohydrate induced hyperlipemia [E78.2] 10/05/2012  . Abnormal blood chemistry [R79.9] 10/05/2012  . Fibromyalgia [M79.7]   . Bipolar disorder ([place]) [F31.9]   . Acute serous otitis media [H65.00] 08/28/2012  . Pelvic and perineal pain [R10.2] 09/20/2011  . CFIDS (chronic fatigue and immune dysfunction syndrome) [R53.82] 09/10/2011  . Clinical depression [F32.9] 09/10/2011  . Acid reflux [K21.9] 09/10/2011  . Feeling faint [R42]  09/10/2011  . Malaise and fatigue [R53.81, R53.83] 09/10/2011  . Non-restorative sleep [G47.8] 09/10/2011  . Disturbance in sleep behavior [G47.9] 09/10/2011   History of Present Illness:   Patient is a 45 year old married female is currently on disability for fibromyalgia and has long history of bipolar disorder presented for follow-up. She presented with her husband. Patient reported that she is feeling very agitated and angry as she did not start taking her Rexulti  since her last appointment. She found out from her pharmacy that it will cost her approximately $500 and she was unable to afford the medication. She decided not to take the medication. She reported that Vistaril was not helping her so she did not take any medication. She reported that she called the office twice but found out that I was on vacation so she was unable to talk to anybody. She reported that she has tried several psycho topic medications in the past and she is not sure that this medication will help her. She was talking nonstop during the interview. Her husband remains supportive. Patient reported that she has done well on Depakote in the past but it caused hair loss. She is receptive to medication changes at this time. She currently denied having any anger anxiety or paranoia. She reported that she wants to sleep well at night as she is only sleeping 2-3 hours on a daily basis.  Patient reported that she is unable to sleep at night and not able to shut her mind off. She currently denied having any suicidal ideations or plans.   Elements:  Severity:  moderate. Associated Signs/Symptoms: Depression Symptoms:  insomnia, psychomotor agitation, fatigue, difficulty concentrating, anxiety, (Hypo)  Manic Symptoms:  Distractibility, Flight of Ideas, Impulsivity, Irritable Mood, Labiality of Mood, Anxiety Symptoms:  Excessive Worry, Psychotic Symptoms:  none PTSD Symptoms: Negative NA  Past Medical History:  Past Medical  History  Diagnosis Date  . Bipolar disorder ([place])   . Fibromyalgia     on disability  . Migraine   . Panic attack   . Muscle spasm   . Uterine perforation     hx of  . Syncope     recurrent  . POTS (postural orthostatic tachycardia syndrome)   . Pain of right side of body     chronic  . Arthritis     Past Surgical History  Procedure Laterality Date  . Dilation and curettage of uterus    . Cataract extraction  dec 2014  . Lasik  dec 2014   Family History:  Family History  Problem Relation Age of Onset  . Diabetes Mother   . Hypertension Mother   . Cancer Father     brain tumor  . Cancer Maternal Grandmother   . Cancer Maternal Grandfather   . Cancer Paternal Grandmother   . Cancer Paternal Grandfather   . Migraines Neg Hx    Social History:   Social History   Social History  . Marital Status: Married    Spouse Name: Izell [place]  . Number of Children: 0  . Years of Education: HS/college   Occupational History  .  Other    disabled   Social History Main Topics  . Smoking status: Never Smoker   . Smokeless tobacco: Never Used  . Alcohol Use: No     Comment: 1 holiday drink yearly  . Drug Use: No  . Sexual Activity: Yes    Birth Control/ Protection: Injection   Other Topics Concern  . 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   Married 20+ years   From [place]   On disability for fibromyalgia   Caffeine Use: none       Additional Social History:  Married x 27 years. Does not have any children. Her husband is also on disability. She stays at home.  Musculoskeletal: Strength & Muscle Tone: within normal limits Gait & Station: normal Patient leans: N/A  Psychiatric Specialty Exam: HPI   ROS   Blood pressure 122/78, temperature 99.2 F (37.3 C), temperature source Tympanic, height 5\' 9"  (1.753 m), weight 175 lb 9.6 oz (79.652 kg).Body mass index is 25.92 kg/(m^2).  General Appearance: Casual  and Fairly Groomed  Eye Contact:  Fair  Speech:  Pressured  Volume:  Increased  Mood:  Anxious  Affect:  Congruent  Thought Process:  Circumstantial  Orientation:  Full (Time, Place, and Person)  Thought Content:  WDL  Suicidal Thoughts:  No  Homicidal Thoughts:  No  Memory:  Immediate;   Fair  Judgement:  Intact  Insight:  Fair  Psychomotor Activity:  Normal  Concentration:  Fair  Recall:  [employer] of Knowledge:Fair  Language: Fair  Akathisia:  No  Handed:  Right  AIMS (if indicated):    Assets:  Communication Skills Physical Health Social Support  ADL's:  Intact  Cognition: WNL  Sleep:     Is the patient at risk to self?  No. Has the patient been a risk to self in the past 6 months?  No. Has the patient been a risk to self within the distant past?  Yes.   Is  the patient a risk to others?  No. Has the patient been a risk to others in the past 6 months?  No. Has the patient been a risk to others within the distant past?  No.  Allergies:   Allergies  Allergen Reactions  . Abilify [Aripiprazole] Other (See Comments)    Weight gain, did not help,   . Depakote [Divalproex Sodium] Other (See Comments)    Hair loss  . Erythromycin Base Swelling  . Lamotrigine Other (See Comments)    Weight gain, angioedema, hives  . Seroquel [Quetiapine Fumarate] Other (See Comments)    irritable  . Tetanus Toxoids     Other reaction(s): OTHER  . Tramadol   . Ziprasidone Hcl Other (See Comments)    Heat/cold intolerance.   . Azithromycin Rash  . Lithium Rash  . Penicillins Rash    Has patient had a PCN reaction causing immediate rash, facial/tongue/throat swelling, SOB or lightheadedness with hypotension: Yes- swelling, rash  Has patient had a PCN reaction causing severe rash involving mucus membranes or skin necrosis: Yes Has patient had a PCN reaction that required hospitalization No Has patient had a PCN reaction occurring within the last 10 years: No If all of the above  answers are "NO", then may proceed with Cephalosporin use.   . Pregabalin Palpitations    Manic symptoms, no sleep x's 3 days, no appetite.  . Sulfa Antibiotics Rash   Current Medications: Current Outpatient Prescriptions  Medication Sig Dispense Refill  . Cholecalciferol (VITAMIN D3) 1000 UNITS CAPS Take 3,000 Units by mouth daily.     . clonazePAM (KLONOPIN) 1 MG tablet TAKE ONE TABLET TWICE DAILY AS NEEDED FOR ANXIETY 60 tablet 0  . fexofenadine (ALLEGRA) 180 MG tablet Take 180 mg by mouth daily.    Marland Kitchen gabapentin (NEURONTIN) 600 MG tablet TAKE ONE (1) TABLET THREE (3) TIMES EACH DAY 90 tablet 5  . HYDROcodone-acetaminophen (NORCO) 5-325 MG tablet Take 1 tablet by mouth every 6 (six) hours as needed for moderate pain. 60 tablet 0  . medroxyPROGESTERone (DEPO-PROVERA) 150 MG/ML injection Inject 150 mg into the muscle every 3 (three) months.    . ranitidine (ZANTAC) 150 MG tablet Take 150 mg by mouth daily.    [facility] tiZANidine (ZANAFLEX) 4 MG tablet TAKE 1 TABLET BY MOUTH EVERY 8 HOURS AS NEEDED FOR MUSCLE SPASMS 30 tablet 5  . vitamin B-12 (CYANOCOBALAMIN) 1000 MCG tablet Take 1,000 mcg by mouth daily.     No current facility-administered medications for this visit.    Previous Psychotropic Medications:  Depakote- Hair loss Lamotrigine- Dont remember Seroquel- ? Swelling, sweating Lithium- acne Geodon- cant remember Risperdal- dont remember Zyprexa-  Latuda- Shakes rexulti - not tried prozac- not doing well.   Seen Psychiatrist for 15 plus years. Lithium was helping in the past Saw Dr Delaney Meigs for many years at [facility] but had billing issues and stopped going to her practice last year.   Psychiatric Hospitalization [facility] x 2- Depressed, suicidal thoughts.  [facility] X1- Extremely agitated and paranoid      Substance Abuse History in the last 12 months:  No.  Consequences of Substance Abuse: Negative NA  Medical Decision Making:  Review of Psycho-Social Stressors (1)  and Decision to obtain old records (1)  Treatment Plan Summary: Medication management   Discussed with patient about the medications and I will start her on Risperdal 0.25 mg at bedtime and advised her to titrate the dose after 2 days to 0.5 mg and she  demonstrated understanding. She was also advised to decrease the Klonopin to 0.5 mg at bedtime and she agreed with the plan. Follow-up in 3 weeks   More than 50% of the time spent in psychoeducation, counseling and coordination of care.  Time spent with pt 25 mins.    This note was generated in part or whole with voice recognition software. Voice regonition is usually quite accurate but there are transcription errors that can and very often do occur. I apologize for any typographical errors that were not detected and corrected.    Rainey Pines, MD    2/23/20173:34 PM

## 2015-08-14 NOTE — Telephone Encounter (Signed)
pt had appt today  

## 2015-08-22 NOTE — Progress Notes (Signed)
Medications being discontinued - pharmacy notified

## 2015-08-25 ENCOUNTER — Other Ambulatory Visit: Payer: Self-pay | Admitting: Family Medicine

## 2015-08-25 NOTE — Telephone Encounter (Signed)
Rx called in to requested pharmacy 

## 2015-08-25 NOTE — Telephone Encounter (Signed)
Last f/u 05/2015 

## 2015-08-28 ENCOUNTER — Encounter: Payer: Self-pay | Admitting: Family Medicine

## 2015-08-28 ENCOUNTER — Ambulatory Visit: Payer: PPO

## 2015-08-28 ENCOUNTER — Ambulatory Visit (INDEPENDENT_AMBULATORY_CARE_PROVIDER_SITE_OTHER): Payer: PPO | Admitting: Family Medicine

## 2015-08-28 VITALS — BP 126/72 | HR 85 | Temp 97.9°F | Wt 174.0 lb

## 2015-08-28 DIAGNOSIS — M797 Fibromyalgia: Secondary | ICD-10-CM

## 2015-08-28 DIAGNOSIS — R000 Tachycardia, unspecified: Secondary | ICD-10-CM | POA: Diagnosis not present

## 2015-08-28 DIAGNOSIS — Z3042 Encounter for surveillance of injectable contraceptive: Secondary | ICD-10-CM

## 2015-08-28 DIAGNOSIS — F314 Bipolar disorder, current episode depressed, severe, without psychotic features: Secondary | ICD-10-CM | POA: Diagnosis not present

## 2015-08-28 DIAGNOSIS — G90A Postural orthostatic tachycardia syndrome (POTS): Secondary | ICD-10-CM

## 2015-08-28 DIAGNOSIS — I951 Orthostatic hypotension: Secondary | ICD-10-CM

## 2015-08-28 MED ORDER — MEDROXYPROGESTERONE ACETATE 150 MG/ML IM SUSP
150.0000 mg | Freq: Once | INTRAMUSCULAR | Status: AC
  Administered 2015-08-28: 150 mg via INTRAMUSCULAR

## 2015-08-28 NOTE — Progress Notes (Signed)
Pre visit review using our clinic review tool, if applicable. No additional management support is needed unless otherwise documented below in the visit note. 

## 2015-08-28 NOTE — Assessment & Plan Note (Signed)
IM depo today. Follow up in 3 months.

## 2015-08-28 NOTE — Progress Notes (Signed)
Subjective:   Patient ID: Melissa [name], female    DOB: [birth year], 45 y.o.   MRN: [id number]  Melissa [name] is a pleasant 45 y.o. year old female who presents to clinic today with Follow-up and Injections  on 08/28/2015  HPI:  Fibromyalgia- doing better.. She has been on disability for fibromyalgia for years. Does still need occassional hydrocodone which I prescribe.  Migraines- has only had one since Dr. Jaynee Eagles placed her on [place] on 02/25/15- note reviewed. She is very pleased.  Due for Depo injection today.     Current Outpatient Prescriptions on File Prior to Visit  Medication Sig Dispense Refill  . Cholecalciferol (VITAMIN D3) 1000 UNITS CAPS Take 3,000 Units by mouth daily.     . clonazePAM (KLONOPIN) 1 MG tablet TAKE ONE TABLET TWICE DAILY AS NEEDED FOR ANXIETY 60 tablet 0  . fexofenadine (ALLEGRA) 180 MG tablet Take 180 mg by mouth daily.    Marland Kitchen gabapentin (NEURONTIN) 600 MG tablet TAKE ONE (1) TABLET THREE (3) TIMES EACH DAY 90 tablet 5  . HYDROcodone-acetaminophen (NORCO) 5-325 MG tablet Take 1 tablet by mouth every 6 (six) hours as needed for moderate pain. 60 tablet 0  . medroxyPROGESTERone (DEPO-PROVERA) 150 MG/ML injection Inject 150 mg into the muscle every 3 (three) months.    . ranitidine (ZANTAC) 150 MG tablet Take 150 mg by mouth daily.    . risperiDONE (RISPERDAL) 0.25 MG tablet Take 2 tablets (0.5 mg total) by mouth 2 (two) times daily. 60 tablet 1  . tiZANidine (ZANAFLEX) 4 MG tablet TAKE 1 TABLET BY MOUTH EVERY 8 HOURS AS NEEDED FOR MUSCLE SPASMS 30 tablet 5  . vitamin B-12 (CYANOCOBALAMIN) 1000 MCG tablet Take 1,000 mcg by mouth daily.     No current facility-administered medications on file prior to visit.    Allergies  Allergen Reactions  . Abilify [Aripiprazole] Other (See Comments)    Weight gain, did not help,   . Depakote [Divalproex Sodium] Other (See Comments)    Hair loss  . Erythromycin Base Swelling  . Lamotrigine Other (See Comments)   Weight gain, angioedema, hives  . Seroquel [Quetiapine Fumarate] Other (See Comments)    irritable  . Tetanus Toxoids     Other reaction(s): OTHER  . Tramadol   . Ziprasidone Hcl Other (See Comments)    Heat/cold intolerance.   . Azithromycin Rash  . Lithium Rash  . Penicillins Rash    Has patient had a PCN reaction causing immediate rash, facial/tongue/throat swelling, SOB or lightheadedness with hypotension: Yes- swelling, rash  Has patient had a PCN reaction causing severe rash involving mucus membranes or skin necrosis: Yes Has patient had a PCN reaction that required hospitalization No Has patient had a PCN reaction occurring within the last 10 years: No If all of the above answers are "NO", then may proceed with Cephalosporin use.   . Pregabalin Palpitations    Manic symptoms, no sleep x's 3 days, no appetite.  . Sulfa Antibiotics Rash    Past Medical History  Diagnosis Date  . Bipolar disorder ([place])   . Fibromyalgia     on disability  . Migraine   . Panic attack   . Muscle spasm   . Uterine perforation     hx of  . Syncope     recurrent  . POTS (postural orthostatic tachycardia syndrome)   . Pain of right side of body     chronic  . Arthritis     Past  Surgical History  Procedure Laterality Date  . Dilation and curettage of uterus    . Cataract extraction  dec 2014  . Lasik  dec 2014    Family History  Problem Relation Age of Onset  . Diabetes Mother   . Hypertension Mother   . Cancer Father     brain tumor  . Cancer Maternal Grandmother   . Cancer Maternal Grandfather   . Cancer Paternal Grandmother   . Cancer Paternal Grandfather   . Migraines Neg Hx     Social History   Social History  . Marital Status: Married    Spouse Name: Izell [place]  . Number of Children: 0  . Years of Education: HS/college   Occupational History  .  Other    disabled   Social History Main Topics  . Smoking status: Never Smoker   . Smokeless tobacco: Never Used  .  Alcohol Use: No     Comment: 1 holiday drink yearly  . Drug Use: No  . Sexual Activity: Yes    Birth Control/ Protection: Injection   Other Topics Concern  . Not on file   Social History Narrative   Patient is married Izell ) and lives at home with her husband.   Married 20+ years   From [place]   On disability for fibromyalgia   Caffeine Use: none       The PMH, PSH, Social History, Family History, Medications, and allergies have been reviewed in [facility], and have been updated if relevant.   Review of Systems  Constitutional: Negative.   Respiratory: Negative.   Cardiovascular: Negative.   Gastrointestinal: Negative.   Endocrine: Negative.   Genitourinary: Negative.   Musculoskeletal: Positive for myalgias.  Skin: Negative.   Allergic/Immunologic: Negative.   Neurological: Negative for headaches.  Hematological: Negative.   Psychiatric/Behavioral: Negative.   All other systems reviewed and are negative.      Objective:    BP 126/72 mmHg  Pulse 85  Temp(Src) 97.9 F (36.6 C) (Oral)  Wt 174 lb (78.926 kg)  SpO2 97%   Physical Exam  Constitutional: She is oriented to person, place, and time. She appears well-developed and well-nourished. No distress.  HENT:  Head: Normocephalic and atraumatic.  Eyes: Conjunctivae are normal.  Neck: Normal range of motion.  Cardiovascular: Normal rate, regular rhythm and normal heart sounds.   Pulmonary/Chest: Effort normal and breath sounds normal. No respiratory distress. She has no wheezes. She has no rales. She exhibits no tenderness.  Musculoskeletal: Normal range of motion. She exhibits no edema.  Neurological: She is alert and oriented to person, place, and time. No cranial nerve deficit.  Skin: Skin is warm and dry.  Psychiatric: She has a normal mood and affect. Her behavior is normal. Judgment and thought content normal.  Nursing note and vitals reviewed.         Assessment & Plan:   Encounter for surveillance of  injectable contraceptive  Fibromyalgia  POTS (postural orthostatic tachycardia syndrome) No Follow-up on file.

## 2015-08-28 NOTE — Assessment & Plan Note (Signed)
Stable

## 2015-09-04 ENCOUNTER — Ambulatory Visit: Payer: PPO | Admitting: Psychiatry

## 2015-09-10 ENCOUNTER — Ambulatory Visit: Payer: PPO | Admitting: Psychiatry

## 2015-09-20 ENCOUNTER — Emergency Department (HOSPITAL_COMMUNITY)
Admission: EM | Admit: 2015-09-20 | Discharge: 2015-09-20 | Disposition: A | Discharge status: Home / Self Care | Payer: PPO | Attending: Emergency Medicine | Admitting: Emergency Medicine

## 2015-09-20 ENCOUNTER — Encounter (HOSPITAL_COMMUNITY): Payer: Self-pay | Admitting: Emergency Medicine

## 2015-09-20 DIAGNOSIS — G43909 Migraine, unspecified, not intractable, without status migrainosus: Secondary | ICD-10-CM | POA: Insufficient documentation

## 2015-09-20 DIAGNOSIS — F410 Panic disorder [episodic paroxysmal anxiety] without agoraphobia: Secondary | ICD-10-CM | POA: Diagnosis not present

## 2015-09-20 DIAGNOSIS — Z880 Allergy status to penicillin: Secondary | ICD-10-CM | POA: Diagnosis not present

## 2015-09-20 DIAGNOSIS — M542 Cervicalgia: Secondary | ICD-10-CM | POA: Insufficient documentation

## 2015-09-20 DIAGNOSIS — M1990 Unspecified osteoarthritis, unspecified site: Secondary | ICD-10-CM | POA: Insufficient documentation

## 2015-09-20 DIAGNOSIS — M25512 Pain in left shoulder: Secondary | ICD-10-CM | POA: Insufficient documentation

## 2015-09-20 DIAGNOSIS — G8929 Other chronic pain: Secondary | ICD-10-CM | POA: Insufficient documentation

## 2015-09-20 DIAGNOSIS — F319 Bipolar disorder, unspecified: Secondary | ICD-10-CM | POA: Insufficient documentation

## 2015-09-20 DIAGNOSIS — Z79899 Other long term (current) drug therapy: Secondary | ICD-10-CM | POA: Insufficient documentation

## 2015-09-20 MED ORDER — HYDROCODONE-ACETAMINOPHEN 5-325 MG PO TABS
1.0000 | ORAL_TABLET | Freq: Once | ORAL | Status: AC
  Administered 2015-09-20: 1 via ORAL
  Filled 2015-09-20: qty 1

## 2015-09-20 MED ORDER — DEXAMETHASONE SODIUM PHOSPHATE 10 MG/ML IJ SOLN
10.0000 mg | Freq: Once | INTRAMUSCULAR | Status: AC
  Administered 2015-09-20: 10 mg via INTRAMUSCULAR
  Filled 2015-09-20: qty 1

## 2015-09-20 MED ORDER — NAPROXEN 500 MG PO TABS: 500.0000 mg | ORAL_TABLET | Freq: Two times a day (BID) | ORAL | Status: DC

## 2015-09-20 MED ORDER — PREDNISONE 20 MG PO TABS: 40.0000 mg | ORAL_TABLET | Freq: Every day | ORAL | Status: DC

## 2015-09-20 MED ORDER — METHOCARBAMOL 500 MG PO TABS
500.0000 mg | ORAL_TABLET | Freq: Once | ORAL | Status: AC
  Administered 2015-09-20: 500 mg via ORAL
  Filled 2015-09-20: qty 1

## 2015-09-20 MED ORDER — METHOCARBAMOL 500 MG PO TABS: 500.0000 mg | ORAL_TABLET | Freq: Two times a day (BID) | ORAL | Status: DC

## 2015-09-20 MED ORDER — KETOROLAC TROMETHAMINE 60 MG/2ML IM SOLN
30.0000 mg | Freq: Once | INTRAMUSCULAR | Status: AC
  Administered 2015-09-20: 30 mg via INTRAMUSCULAR
  Filled 2015-09-20: qty 2

## 2015-09-20 MED ORDER — DICLOFENAC SODIUM 1 % TD GEL: TRANSDERMAL | Status: DC

## 2015-09-20 NOTE — ED Notes (Signed)
Pt reports understanding of discharge information. No questions at time of discharge 

## 2015-09-20 NOTE — Discharge Instructions (Signed)
I will give you a couple prescriptions to help with your pain. You may use the robaxin OR the Zanaflex you have at home. Do not take both. You may also take the Vicodin you have at home in addition to the medications I prescribed you. Warm soaks as discussed. Please follow up with your primary care provider within one week. Return to the ER for new or worsening symptoms.

## 2015-09-20 NOTE — ED Provider Notes (Signed)
CSN: [id number]     Arrival date & time 09/20/15  1456 History  By signing my name below, I, Dora Sims, attest that this documentation has been prepared under the direction and in the presence of non-physician practitioner, Delrae Rend, PA-C. Electronically Signed: Dora Sims, Scribe. 09/20/2015. 3:19 PM.    Chief Complaint  Patient presents with  . Neck Pain    The history is provided by the patient. No language interpreter was used.     HPI Comments: Melissa [name] is a 45 y.o. female with h/o fibromyalgia and arthritis who presents to the Emergency Department complaining of sudden onset, constant, worsening, left-sided neck pain beginning around 4PM yesterday. Pt was watching television when the pain presented; she did not jerk or move her neck suddenly. Pt notes no alleviating factors; she has taken OTC meds, Zanaflex, hydrocodone and applied ice/heat to her neck. She notes that her neck pain radiates into the middle of her posterior neck, left shoulder and her left upper arm. Pt notes that she generally experiences stiffness in her neck due to fibromyalgia but never experiences pain of this character. She denies fever, chills, left arm numbness, or any other associated symptoms. She notes that she took Klonopin and Zanaflex around 8AM this morning. Pt has never seen a neurologist or neurosurgeon for her neck pain, though has seen neurology in the past for fibromyalgia and migraines. Denies headache currently.  Past Medical History  Diagnosis Date  . Bipolar disorder ([place])   . Fibromyalgia     on disability  . Migraine   . Panic attack   . Muscle spasm   . Uterine perforation     hx of  . Syncope     recurrent  . POTS (postural orthostatic tachycardia syndrome)   . Pain of right side of body     chronic  . Arthritis    Past Surgical History  Procedure Laterality Date  . Dilation and curettage of uterus    . Cataract extraction  dec 2014  . Lasik  dec 2014   Family  History  Problem Relation Age of Onset  . Diabetes Mother   . Hypertension Mother   . Cancer Father     brain tumor  . Cancer Maternal Grandmother   . Cancer Maternal Grandfather   . Cancer Paternal Grandmother   . Cancer Paternal Grandfather   . Migraines Neg Hx    Social History  Substance Use Topics  . Smoking status: Never Smoker   . Smokeless tobacco: Never Used  . Alcohol Use: No     Comment: 1 holiday drink yearly   OB History    No data available     Review of Systems  Constitutional: Negative for fever and chills.  Musculoskeletal: Positive for arthralgias (left shoulder, left arm) and neck pain (left).  Neurological: Negative for numbness.  All other systems reviewed and are negative.     Allergies  Abilify; Depakote; Erythromycin base; Lamotrigine; Seroquel; Tetanus toxoids; Tramadol; Ziprasidone hcl; Azithromycin; Lithium; Penicillins; Pregabalin; and Sulfa antibiotics  Home Medications   Prior to Admission medications   Medication Sig Start Date End Date Taking? Authorizing Provider  Cholecalciferol (VITAMIN D3) 1000 UNITS CAPS Take 3,000 Units by mouth daily.     Historical Provider, MD  clonazePAM (KLONOPIN) 1 MG tablet TAKE ONE TABLET TWICE DAILY AS NEEDED FOR ANXIETY 08/25/15   Lucille Passy, MD  fexofenadine (ALLEGRA) 180 MG tablet Take 180 mg by mouth daily.  Historical Provider, MD  gabapentin (NEURONTIN) 600 MG tablet TAKE ONE (1) TABLET THREE (3) TIMES EACH DAY 07/25/15   Lucille Passy, MD  HYDROcodone-acetaminophen (NORCO) 5-325 MG tablet Take 1 tablet by mouth every 6 (six) hours as needed for moderate pain. 06/03/15   Lucille Passy, MD  medroxyPROGESTERone (DEPO-PROVERA) 150 MG/ML injection Inject 150 mg into the muscle every 3 (three) months.    Historical Provider, MD  ranitidine (ZANTAC) 150 MG tablet Take 150 mg by mouth daily.    Historical Provider, MD  risperiDONE (RISPERDAL) 0.25 MG tablet Take 2 tablets (0.5 mg total) by mouth 2 (two) times  daily. 08/14/15   Rainey Pines, MD  tiZANidine (ZANAFLEX) 4 MG tablet TAKE 1 TABLET BY MOUTH EVERY 8 HOURS AS NEEDED FOR MUSCLE SPASMS 07/25/15   Lucille Passy, MD  vitamin B-12 (CYANOCOBALAMIN) 1000 MCG tablet Take 1,000 mcg by mouth daily.    Historical Provider, MD   BP 124/82 mmHg  Pulse 87  Temp(Src) 98 F (36.7 C) (Oral)  Resp 18  SpO2 100% Physical Exam  Constitutional: She is oriented to person, place, and time. She appears well-developed and well-nourished. No distress.  HENT:  Head: Normocephalic and atraumatic.  Eyes: Conjunctivae and EOM are normal.  Neck: Neck supple. No tracheal deviation present.  Full passive ROM without pain  No c-spine tenderness No paraspinal tenderness  Neck is not rigid  Cardiovascular: Normal rate.   Pulmonary/Chest: Effort normal. No respiratory distress.  Musculoskeletal: Normal range of motion.  Neurological: She is alert and oriented to person, place, and time.  UE strength and sensation intact bilaterally. Normal finger to nose. No pronator drift.  Skin: Skin is warm and dry.  Psychiatric: She has a normal mood and affect. Her behavior is normal.  Nursing note and vitals reviewed.   ED Course  Procedures (including critical care time)  DIAGNOSTIC STUDIES: Oxygen Saturation is 100% on RA, normal by my interpretation.    COORDINATION OF CARE: 3:19 PM Discussed treatment plan with pt at bedside and pt agreed to plan.  Labs Review Labs Reviewed - No data to display  Imaging Review No results found. I have personally reviewed and evaluated these images and lab results as part of my medical decision-making.   EKG Interpretation None      MDM   Final diagnoses:  Neck pain, acute    Suspect msk strain vs acute on chronic fibromyalgia pain. Discussed with pt will trial toradol and decadron here with home dose of norco. Anticipate d/c home with rx for steroids and voltaren gel. Pt has vicodin and zanaflex at home. Exam otherwise  nonfocal with intact neuro exam, no meningismus. No indication for imaging or further workup today.   Pt reports minimal to no improvement with above meds. Will trial robaxin to see if it works better than her home zanaflex, which she last took around 8 AM this morning.  Pt starting to feel improved. Discussed with her that steroids will also take time to become effective. rx given for robaxin to try instead of zanaflex at home. rx also given for naproxen and voltaren gel. Instructed that she may take home hydrocodone if needed. Instructed to f/u with PCP early next week. ER return precautions given.   I personally performed the services described in this documentation, which was scribed in my presence. The recorded information has been reviewed and is accurate.     Anne Ng, PA-C 09/20/15 1745  Orlie Dakin, MD 09/21/15  0033 

## 2015-09-20 NOTE — ED Notes (Signed)
PA at bedside.

## 2015-09-20 NOTE — ED Notes (Signed)
Pt states around 16:00 yesterday she began having left sided neck pain while watching T.V. States did not injure it. Denies photophobia, emesis, or fever. States it feels stiff, but turning her head does not hurt to move it.

## 2015-09-23 ENCOUNTER — Ambulatory Visit (INDEPENDENT_AMBULATORY_CARE_PROVIDER_SITE_OTHER): Payer: PPO | Admitting: Family Medicine

## 2015-09-23 ENCOUNTER — Encounter: Payer: Self-pay | Admitting: Family Medicine

## 2015-09-23 VITALS — BP 130/70 | HR 82 | Temp 97.7°F | Wt 177.0 lb

## 2015-09-23 DIAGNOSIS — M797 Fibromyalgia: Secondary | ICD-10-CM

## 2015-09-23 DIAGNOSIS — G894 Chronic pain syndrome: Secondary | ICD-10-CM | POA: Diagnosis not present

## 2015-09-23 DIAGNOSIS — Z79899 Other long term (current) drug therapy: Secondary | ICD-10-CM | POA: Diagnosis not present

## 2015-09-23 DIAGNOSIS — F3189 Other bipolar disorder: Secondary | ICD-10-CM | POA: Diagnosis not present

## 2015-09-23 MED ORDER — HYDROCODONE-ACETAMINOPHEN 5-325 MG PO TABS: 1.0000 | ORAL_TABLET | Freq: Four times a day (QID) | ORAL | Status: DC | PRN

## 2015-09-23 MED ORDER — CLONAZEPAM 1 MG PO TABS
ORAL_TABLET | ORAL | Status: DC
Start: 2015-09-23 — End: 2015-10-22

## 2015-09-23 MED ORDER — TIZANIDINE HCL 4 MG PO TABS: ORAL_TABLET | ORAL | Status: DC

## 2015-09-23 NOTE — Progress Notes (Signed)
Subjective:   Patient ID: Melissa [name], female    DOB: [birth year], 45 y.o.   MRN: [id number]  Melissa [name] is a pleasant 45 y.o. year old female who presents to clinic today with Hospitalization Follow-up  on 09/23/2015  HPI:  Was seen  In the ER on 09/20/15 for acute onset of left sided neck pain that occurred at rest. Notes reviewed.  Was taking Zanaflex, hydrocodone and applied heat and ice without relief.  Denied any fevers, chills, CP or left arm numbness.   No labs or imaging done.  Acute muscle strain suspected- acute on chronic fibromyalgia.  Given toradol and decadron along with home rxs for norco and robaxin.  Still having pain.  Stopped taking xanaflex and tried robaxin but it was not helpful.  Restarted xanaflex last night.  Current Outpatient Prescriptions on File Prior to Visit  Medication Sig Dispense Refill  . Cholecalciferol (VITAMIN D3) 1000 UNITS CAPS Take 3,000 Units by mouth daily.     . clonazePAM (KLONOPIN) 1 MG tablet TAKE ONE TABLET TWICE DAILY AS NEEDED FOR ANXIETY 60 tablet 0  . diclofenac sodium (VOLTAREN) 1 % GEL Apply to affected area three to four times daily. 100 g 0  . fexofenadine (ALLEGRA) 180 MG tablet Take 180 mg by mouth daily.    Marland Kitchen gabapentin (NEURONTIN) 600 MG tablet TAKE ONE (1) TABLET THREE (3) TIMES EACH DAY 90 tablet 5  . HYDROcodone-acetaminophen (NORCO) 5-325 MG tablet Take 1 tablet by mouth every 6 (six) hours as needed for moderate pain. 60 tablet 0  . medroxyPROGESTERone (DEPO-PROVERA) 150 MG/ML injection Inject 150 mg into the muscle every 3 (three) months.    . naproxen (NAPROSYN) 500 MG tablet Take 1 tablet (500 mg total) by mouth 2 (two) times daily. 30 tablet 0  . predniSONE (DELTASONE) 20 MG tablet Take 2 tablets (40 mg total) by mouth daily. 8 tablet 0  . ranitidine (ZANTAC) 150 MG tablet Take 150 mg by mouth daily.    . risperiDONE (RISPERDAL) 0.25 MG tablet Take 2 tablets (0.5 mg total) by mouth 2 (two) times daily. 60  tablet 1  . tiZANidine (ZANAFLEX) 4 MG tablet TAKE 1 TABLET BY MOUTH EVERY 8 HOURS AS NEEDED FOR MUSCLE SPASMS 30 tablet 5  . vitamin B-12 (CYANOCOBALAMIN) 1000 MCG tablet Take 1,000 mcg by mouth daily.     No current facility-administered medications on file prior to visit.    Allergies  Allergen Reactions  . Abilify [Aripiprazole] Other (See Comments)    Weight gain, did not help,   . Depakote [Divalproex Sodium] Other (See Comments)    Hair loss  . Erythromycin Base Swelling  . Lamotrigine Other (See Comments)    Weight gain, angioedema, hives  . Seroquel [Quetiapine Fumarate] Other (See Comments)    irritable  . Tetanus Toxoids     Other reaction(s): OTHER  . Tramadol   . Ziprasidone Hcl Other (See Comments)    Heat/cold intolerance.   . Azithromycin Rash  . Lithium Rash  . Penicillins Rash    Has patient had a PCN reaction causing immediate rash, facial/tongue/throat swelling, SOB or lightheadedness with hypotension: Yes- swelling, rash  Has patient had a PCN reaction causing severe rash involving mucus membranes or skin necrosis: Yes Has patient had a PCN reaction that required hospitalization No Has patient had a PCN reaction occurring within the last 10 years: No If all of the above answers are "NO", then may proceed with Cephalosporin use.   Marland Kitchen  Pregabalin Palpitations    Manic symptoms, no sleep x's 3 days, no appetite.  . Sulfa Antibiotics Rash    Past Medical History  Diagnosis Date  . Bipolar disorder ([place])   . Fibromyalgia     on disability  . Migraine   . Panic attack   . Muscle spasm   . Uterine perforation     hx of  . Syncope     recurrent  . POTS (postural orthostatic tachycardia syndrome)   . Pain of right side of body     chronic  . Arthritis     Past Surgical History  Procedure Laterality Date  . Dilation and curettage of uterus    . Cataract extraction  dec 2014  . Lasik  dec 2014    Family History  Problem Relation Age of Onset  .  Diabetes Mother   . Hypertension Mother   . Cancer Father     brain tumor  . Cancer Maternal Grandmother   . Cancer Maternal Grandfather   . Cancer Paternal Grandmother   . Cancer Paternal Grandfather   . Migraines Neg Hx     Social History   Social History  . Marital Status: Married    Spouse Name: Izell [place]  . Number of Children: 0  . Years of Education: HS/college   Occupational History  .  Other    disabled   Social History Main Topics  . Smoking status: Never Smoker   . Smokeless tobacco: Never Used  . Alcohol Use: No     Comment: 1 holiday drink yearly  . Drug Use: No  . Sexual Activity: Yes    Birth Control/ Protection: Injection   Other Topics Concern  . Not on file   Social History Narrative   Patient is married Izell [place]) and lives at home with her husband.   Married 20+ years   From [place]   On disability for fibromyalgia   Caffeine Use: none       The PMH, PSH, Social History, Family History, Medications, and allergies have been reviewed in [facility], and have been updated if relevant.     Review of Systems  Respiratory: Negative.   Cardiovascular: Negative.   Gastrointestinal: Negative.   Musculoskeletal: Positive for neck pain and neck stiffness. Negative for myalgias, back pain, joint swelling, arthralgias and gait problem.  Neurological: Negative.   Hematological: Negative.   Psychiatric/Behavioral: Negative.   All other systems reviewed and are negative.      Objective:    BP 130/70 mmHg  Pulse 82  Temp(Src) 97.7 F (36.5 C) (Oral)  Wt 177 lb (80.287 kg)  SpO2 98%   Physical Exam  Constitutional: She is oriented to person, place, and time.  HENT:  Head: Normocephalic and atraumatic.  Eyes: Conjunctivae are normal.  Cardiovascular: Normal rate.   Pulmonary/Chest: Effort normal.  Musculoskeletal:       Arms: Neurological: She is alert and oriented to person, place, and time. No cranial nerve deficit.  Skin: Skin is warm and dry.    Psychiatric: She has a normal mood and affect. Her behavior is normal. Thought content normal.  Nursing note and vitals reviewed.         Assessment & Plan:   Fibromyalgia  Chronic pain syndrome No Follow-up on file.

## 2015-09-23 NOTE — Progress Notes (Signed)
Pre visit review using our clinic review tool, if applicable. No additional management support is needed unless otherwise documented below in the visit note. 

## 2015-09-23 NOTE — Assessment & Plan Note (Signed)
Deteriorated acutely due to trapezius spasm. Improving. Continue current rxs. No changes made today. Call or return to clinic prn if these symptoms worsen or fail to improve as anticipated. The patient indicates understanding of these issues and agrees with the plan.

## 2015-10-03 DIAGNOSIS — I951 Orthostatic hypotension: Secondary | ICD-10-CM | POA: Diagnosis not present

## 2015-10-03 DIAGNOSIS — R000 Tachycardia, unspecified: Secondary | ICD-10-CM | POA: Diagnosis not present

## 2015-10-16 ENCOUNTER — Emergency Department (HOSPITAL_COMMUNITY)
Admission: EM | Admit: 2015-10-16 | Discharge: 2015-10-16 | Disposition: A | Discharge status: Home / Self Care | Payer: PPO | Attending: Emergency Medicine | Admitting: Emergency Medicine

## 2015-10-16 ENCOUNTER — Encounter (HOSPITAL_COMMUNITY): Payer: Self-pay | Admitting: Emergency Medicine

## 2015-10-16 DIAGNOSIS — M797 Fibromyalgia: Secondary | ICD-10-CM | POA: Diagnosis not present

## 2015-10-16 DIAGNOSIS — Z7952 Long term (current) use of systemic steroids: Secondary | ICD-10-CM | POA: Insufficient documentation

## 2015-10-16 DIAGNOSIS — G8929 Other chronic pain: Secondary | ICD-10-CM | POA: Diagnosis not present

## 2015-10-16 DIAGNOSIS — G43909 Migraine, unspecified, not intractable, without status migrainosus: Secondary | ICD-10-CM | POA: Diagnosis not present

## 2015-10-16 DIAGNOSIS — R110 Nausea: Secondary | ICD-10-CM | POA: Insufficient documentation

## 2015-10-16 DIAGNOSIS — F319 Bipolar disorder, unspecified: Secondary | ICD-10-CM | POA: Diagnosis not present

## 2015-10-16 DIAGNOSIS — R55 Syncope and collapse: Secondary | ICD-10-CM | POA: Diagnosis not present

## 2015-10-16 DIAGNOSIS — F410 Panic disorder [episodic paroxysmal anxiety] without agoraphobia: Secondary | ICD-10-CM | POA: Diagnosis not present

## 2015-10-16 DIAGNOSIS — Z880 Allergy status to penicillin: Secondary | ICD-10-CM | POA: Diagnosis not present

## 2015-10-16 DIAGNOSIS — R42 Dizziness and giddiness: Secondary | ICD-10-CM | POA: Diagnosis not present

## 2015-10-16 DIAGNOSIS — Z791 Long term (current) use of non-steroidal anti-inflammatories (NSAID): Secondary | ICD-10-CM | POA: Insufficient documentation

## 2015-10-16 DIAGNOSIS — Z3202 Encounter for pregnancy test, result negative: Secondary | ICD-10-CM | POA: Insufficient documentation

## 2015-10-16 DIAGNOSIS — M1990 Unspecified osteoarthritis, unspecified site: Secondary | ICD-10-CM | POA: Insufficient documentation

## 2015-10-16 DIAGNOSIS — Z79899 Other long term (current) drug therapy: Secondary | ICD-10-CM | POA: Diagnosis not present

## 2015-10-16 LAB — CBC
HCT: 38.4 % (ref 36.0–46.0)
Hemoglobin: 13.4 g/dL (ref 12.0–15.0)
MCH: 28.9 pg (ref 26.0–34.0)
MCHC: 34.9 g/dL (ref 30.0–36.0)
MCV: 82.8 fL (ref 78.0–100.0)
PLATELETS: 221 10*3/uL (ref 150–400)
RBC: 4.64 MIL/uL (ref 3.87–5.11)
RDW: 14 % (ref 11.5–15.5)
WBC: 7.2 10*3/uL (ref 4.0–10.5)

## 2015-10-16 LAB — URINE MICROSCOPIC-ADD ON

## 2015-10-16 LAB — URINALYSIS, ROUTINE W REFLEX MICROSCOPIC
Bilirubin Urine: NEGATIVE
Glucose, UA: NEGATIVE mg/dL
Hgb urine dipstick: NEGATIVE
KETONES UR: NEGATIVE mg/dL
NITRITE: NEGATIVE
PROTEIN: NEGATIVE mg/dL
Specific Gravity, Urine: 1.018 (ref 1.005–1.030)
pH: 5.5 (ref 5.0–8.0)

## 2015-10-16 LAB — I-STAT BETA HCG BLOOD, ED (MC, WL, AP ONLY)

## 2015-10-16 LAB — BASIC METABOLIC PANEL
Anion gap: 9 (ref 5–15)
BUN: 10 mg/dL (ref 6–20)
CHLORIDE: 110 mmol/L (ref 101–111)
CO2: 21 mmol/L — ABNORMAL LOW (ref 22–32)
CREATININE: 0.93 mg/dL (ref 0.44–1.00)
Calcium: 8.8 mg/dL — ABNORMAL LOW (ref 8.9–10.3)
Glucose, Bld: 92 mg/dL (ref 65–99)
POTASSIUM: 4.8 mmol/L (ref 3.5–5.1)
SODIUM: 140 mmol/L (ref 135–145)

## 2015-10-16 LAB — I-STAT TROPONIN, ED: TROPONIN I, POC: 0 ng/mL (ref 0.00–0.08)

## 2015-10-16 MED ORDER — SODIUM CHLORIDE 0.9 % IV BOLUS (SEPSIS)
1000.0000 mL | Freq: Once | INTRAVENOUS | Status: AC
  Administered 2015-10-16: 1000 mL via INTRAVENOUS

## 2015-10-16 NOTE — ED Notes (Signed)
Pt aware urine sample is needed and cup provieded

## 2015-10-16 NOTE — ED Notes (Addendum)
Pt reports feeling unwell for the past week. Pt reports lightheadedness and room spinning sensation. Pt has been having nausea at home. LBM 5 days ago. Pt thinks she may be dehydrated. Pt has been having generalized body cramps.

## 2015-10-16 NOTE — ED Provider Notes (Signed)
CSN: [id number]     Arrival date & time 10/16/15  1608 History   First MD Initiated Contact with Patient 10/16/15 1726     No chief complaint on file.    (Consider location/radiation/quality/duration/timing/severity/associated sxs/prior Treatment) HPI Comments: Patient presents to the emergency department with chief complaint of syncopal episode yesterday. She states that she has a history of POTS, and that this is not a new problem for her. She states that normally after she has a syncopal episode she drinks extra water and is fine. She states that she has been doing this, but states that she has still been feeling lightheaded and nauseated. She states that when this happens to her she usually has to come to the hospital to get fluids. She denies any chest pain, shortness of breath, or abdominal pain. There are no modifying factors. She is followed by cardiology at [facility] She denies any other associated symptoms.  The history is provided by the patient. No language interpreter was used.    Past Medical History  Diagnosis Date  . Bipolar disorder ([place])   . Fibromyalgia     on disability  . Migraine   . Panic attack   . Muscle spasm   . Uterine perforation     hx of  . Syncope     recurrent  . POTS (postural orthostatic tachycardia syndrome)   . Pain of right side of body     chronic  . Arthritis    Past Surgical History  Procedure Laterality Date  . Dilation and curettage of uterus    . Cataract extraction  dec 2014  . Lasik  dec 2014   Family History  Problem Relation Age of Onset  . Diabetes Mother   . Hypertension Mother   . Cancer Father     brain tumor  . Cancer Maternal Grandmother   . Cancer Maternal Grandfather   . Cancer Paternal Grandmother   . Cancer Paternal Grandfather   . Migraines Neg Hx    Social History  Substance Use Topics  . Smoking status: Never Smoker   . Smokeless tobacco: Never Used  . Alcohol Use: No     Comment: 1 holiday drink yearly    OB History    No data available     Review of Systems  Constitutional: Negative for fever and chills.  Respiratory: Negative for shortness of breath.   Cardiovascular: Negative for chest pain.  Gastrointestinal: Negative for nausea, vomiting, diarrhea and constipation.  Genitourinary: Negative for dysuria.  Neurological: Positive for syncope.  All other systems reviewed and are negative.     Allergies  Abilify; Depakote; Erythromycin base; Lamotrigine; Seroquel; Tetanus toxoids; Tramadol; Ziprasidone hcl; Azithromycin; Lithium; Penicillins; Pregabalin; and Sulfa antibiotics  Home Medications   Prior to Admission medications   Medication Sig Start Date End Date Taking? Authorizing Provider  Cholecalciferol (VITAMIN D3) 1000 UNITS CAPS Take 3,000 Units by mouth daily.     Historical Provider, MD  clonazePAM (KLONOPIN) 1 MG tablet TAKE ONE TABLET TWICE DAILY AS NEEDED FOR ANXIETY 09/23/15   Lucille Passy, MD  diclofenac sodium (VOLTAREN) 1 % GEL Apply to affected area three to four times daily. 09/20/15   Olivia Canter Sam, PA-C  fexofenadine (ALLEGRA) 180 MG tablet Take 180 mg by mouth daily.    Historical Provider, MD  gabapentin (NEURONTIN) 600 MG tablet TAKE ONE (1) TABLET THREE (3) TIMES EACH DAY 07/25/15   Lucille Passy, MD  HYDROcodone-acetaminophen (NORCO) 5-325 MG tablet Take  1 tablet by mouth every 6 (six) hours as needed for moderate pain. 09/23/15   Lucille Passy, MD  medroxyPROGESTERone (DEPO-PROVERA) 150 MG/ML injection Inject 150 mg into the muscle every 3 (three) months.    Historical Provider, MD  naproxen (NAPROSYN) 500 MG tablet Take 1 tablet (500 mg total) by mouth 2 (two) times daily. 09/20/15   Olivia Canter Sam, PA-C  predniSONE (DELTASONE) 20 MG tablet Take 2 tablets (40 mg total) by mouth daily. 09/20/15   Olivia Canter Sam, PA-C  ranitidine (ZANTAC) 150 MG tablet Take 150 mg by mouth daily.    Historical Provider, MD  risperiDONE (RISPERDAL) 0.25 MG tablet Take 2 tablets (0.5 mg total)  by mouth 2 (two) times daily. 08/14/15   Rainey Pines, MD  tiZANidine (ZANAFLEX) 4 MG tablet TAKE 1 TABLET BY MOUTH EVERY 8 HOURS AS NEEDED FOR MUSCLE SPASMS 09/23/15   Lucille Passy, MD  vitamin B-12 (CYANOCOBALAMIN) 1000 MCG tablet Take 1,000 mcg by mouth daily.    Historical Provider, MD   BP 122/78 mmHg  Pulse 86  Temp(Src) 98.4 F (36.9 C) (Oral)  Resp 18  Ht 5\' 9"  (1.753 m)  Wt 77.111 kg  BMI 25.09 kg/m2  SpO2 100% Physical Exam  Constitutional: She is oriented to person, place, and time. She appears well-developed and well-nourished.  HENT:  Head: Normocephalic and atraumatic.  Eyes: Conjunctivae and EOM are normal. Pupils are equal, round, and reactive to light.  Neck: Normal range of motion. Neck supple.  Cardiovascular: Normal rate and regular rhythm.  Exam reveals no gallop and no friction rub.   No murmur heard. Pulmonary/Chest: Effort normal and breath sounds normal. No respiratory distress. She has no wheezes. She has no rales. She exhibits no tenderness.  Abdominal: Soft. Bowel sounds are normal. She exhibits no distension and no mass. There is no tenderness. There is no rebound and no guarding.  Musculoskeletal: Normal range of motion. She exhibits no edema or tenderness.  Neurological: She is alert and oriented to person, place, and time.  Skin: Skin is warm and dry.  Psychiatric: She has a normal mood and affect. Her behavior is normal. Judgment and thought content normal.  Nursing note and vitals reviewed.   ED Course  Procedures (including critical care time) Results for orders placed or performed during the hospital encounter of [id number]  Basic metabolic panel  Result Value Ref Range   Sodium 140 135 - 145 mmol/L   Potassium 4.8 3.5 - 5.1 mmol/L   Chloride 110 101 - 111 mmol/L   CO2 21 (L) 22 - 32 mmol/L   Glucose, Bld 92 65 - 99 mg/dL   BUN 10 6 - 20 mg/dL   Creatinine, Ser 0.93 0.44 - 1.00 mg/dL   Calcium 8.8 (L) 8.9 - 10.3 mg/dL   GFR calc non Af Amer >60  >60 mL/min   GFR calc Af Amer >60 >60 mL/min   Anion gap 9 5 - 15  CBC  Result Value Ref Range   WBC 7.2 4.0 - 10.5 K/uL   RBC 4.64 3.87 - 5.11 MIL/uL   Hemoglobin 13.4 12.0 - 15.0 g/dL   HCT 38.4 36.0 - 46.0 %   MCV 82.8 78.0 - 100.0 fL   MCH 28.9 26.0 - 34.0 pg   MCHC 34.9 30.0 - 36.0 g/dL   RDW 14.0 11.5 - 15.5 %   Platelets 221 150 - 400 K/uL  Urinalysis, Routine w reflex microscopic (not at [facility])  Result Value  Ref Range   Color, Urine YELLOW YELLOW   APPearance CLEAR CLEAR   Specific Gravity, Urine 1.018 1.005 - 1.030   pH 5.5 5.0 - 8.0   Glucose, UA NEGATIVE NEGATIVE mg/dL   Hgb urine dipstick NEGATIVE NEGATIVE   Bilirubin Urine NEGATIVE NEGATIVE   Ketones, ur NEGATIVE NEGATIVE mg/dL   Protein, ur NEGATIVE NEGATIVE mg/dL   Nitrite NEGATIVE NEGATIVE   Leukocytes, UA SMALL (A) NEGATIVE  Urine microscopic-add on  Result Value Ref Range   Squamous Epithelial / LPF 6-30 (A) NONE SEEN   WBC, UA 0-5 0 - 5 WBC/hpf   RBC / HPF 0-5 0 - 5 RBC/hpf   Bacteria, UA RARE (A) NONE SEEN  I-Stat Beta hCG blood, ED ([facility], [facility], [facility] only)  Result Value Ref Range   I-stat hCG, quantitative <5.0 <5 mIU/mL   Comment 3          I-Stat Troponin, ED (not at [facility])  Result Value Ref Range   Troponin i, poc 0.00 0.00 - 0.08 ng/mL   Comment 3           No results found.  I have personally reviewed and evaluated these images and lab results as part of my medical decision-making.   MDM   Final diagnoses:  Syncope, unspecified syncope type    Patient with syncopal episode yesterday.  Hx of POTS.  Feels dehydrated. Will check labs, EKG, and give fluids, will reassess.  7:52 PM Patient feels improved. Normal orthostatic vital signs. Laboratory workup is unremarkable. Patient feels stable for discharge. Will recommend primary care/cardiology follow-up. The patient has access to both these resources, and will contact her doctors.  7:59 PM EKG reviewed with Dr. Gilford Raid, who agrees patient can go  home.  EKG did show some new t-wave changes, but patient does not have any chest pain and feels well. DC to home.   Montine Circle, PA-C 10/16/15 2005

## 2015-10-16 NOTE — Discharge Instructions (Signed)

## 2015-10-21 DIAGNOSIS — F3161 Bipolar disorder, current episode mixed, mild: Secondary | ICD-10-CM | POA: Diagnosis not present

## 2015-10-21 DIAGNOSIS — Z79899 Other long term (current) drug therapy: Secondary | ICD-10-CM | POA: Diagnosis not present

## 2015-10-21 DIAGNOSIS — F411 Generalized anxiety disorder: Secondary | ICD-10-CM | POA: Diagnosis not present

## 2015-10-22 ENCOUNTER — Other Ambulatory Visit: Payer: Self-pay | Admitting: Family Medicine

## 2015-10-22 NOTE — Telephone Encounter (Signed)
Ok to refill one time only. 

## 2015-10-22 NOTE — Telephone Encounter (Signed)
Last f/u 08/2015 

## 2015-10-23 NOTE — Telephone Encounter (Signed)
Rx called in to requested pharmacy 

## 2015-11-03 ENCOUNTER — Telehealth: Payer: Self-pay | Admitting: Family Medicine

## 2015-11-03 ENCOUNTER — Emergency Department (HOSPITAL_COMMUNITY)
Admission: EM | Admit: 2015-11-03 | Discharge: 2015-11-03 | Disposition: A | Discharge status: Home / Self Care | Payer: PPO | Attending: Emergency Medicine | Admitting: Emergency Medicine

## 2015-11-03 ENCOUNTER — Emergency Department (HOSPITAL_COMMUNITY): Payer: PPO

## 2015-11-03 ENCOUNTER — Encounter (HOSPITAL_COMMUNITY): Payer: Self-pay | Admitting: Emergency Medicine

## 2015-11-03 DIAGNOSIS — R0789 Other chest pain: Secondary | ICD-10-CM | POA: Diagnosis not present

## 2015-11-03 DIAGNOSIS — Z79899 Other long term (current) drug therapy: Secondary | ICD-10-CM | POA: Diagnosis not present

## 2015-11-03 DIAGNOSIS — Z79891 Long term (current) use of opiate analgesic: Secondary | ICD-10-CM | POA: Insufficient documentation

## 2015-11-03 DIAGNOSIS — R0602 Shortness of breath: Secondary | ICD-10-CM | POA: Diagnosis not present

## 2015-11-03 DIAGNOSIS — R072 Precordial pain: Secondary | ICD-10-CM | POA: Diagnosis not present

## 2015-11-03 DIAGNOSIS — F319 Bipolar disorder, unspecified: Secondary | ICD-10-CM | POA: Diagnosis not present

## 2015-11-03 LAB — BASIC METABOLIC PANEL
Anion gap: 7 (ref 5–15)
BUN: 12 mg/dL (ref 6–20)
CO2: 21 mmol/L — ABNORMAL LOW (ref 22–32)
Calcium: 8.7 mg/dL — ABNORMAL LOW (ref 8.9–10.3)
Chloride: 112 mmol/L — ABNORMAL HIGH (ref 101–111)
Creatinine, Ser: 1.12 mg/dL — ABNORMAL HIGH (ref 0.44–1.00)
GFR calc Af Amer: >60 mL/min (ref >60)
GFR calc non Af Amer: 58 mL/min — ABNORMAL LOW (ref >60)
Glucose, Bld: 101 mg/dL — ABNORMAL HIGH (ref 65–99)
Potassium: 3.8 mmol/L (ref 3.5–5.1)
Sodium: 140 mmol/L (ref 135–145)

## 2015-11-03 LAB — CBC
HCT: 42.4 % (ref 36.0–46.0)
Hemoglobin: 14.3 g/dL (ref 12.0–15.0)
MCH: 28.4 pg (ref 26.0–34.0)
MCHC: 33.7 g/dL (ref 30.0–36.0)
MCV: 84.3 fL (ref 78.0–100.0)
Platelets: 233 10*3/uL (ref 150–400)
RBC: 5.03 MIL/uL (ref 3.87–5.11)
RDW: 13.7 % (ref 11.5–15.5)
WBC: 7.7 10*3/uL (ref 4.0–10.5)

## 2015-11-03 LAB — D-DIMER, QUANTITATIVE: D-Dimer, Quant: 0.54 ug/mL-FEU — ABNORMAL HIGH (ref 0.00–0.50)

## 2015-11-03 LAB — I-STAT TROPONIN, ED: Troponin i, poc: 0.01 ng/mL (ref 0.00–0.08)

## 2015-11-03 MED ORDER — ASPIRIN 81 MG PO CHEW
324.0000 mg | CHEWABLE_TABLET | Freq: Once | ORAL | Status: AC
  Administered 2015-11-03: 324 mg via ORAL
  Filled 2015-11-03: qty 4

## 2015-11-03 MED ORDER — SODIUM CHLORIDE 0.9 % IV BOLUS (SEPSIS)
1000.0000 mL | Freq: Once | INTRAVENOUS | Status: AC
  Administered 2015-11-03: 1000 mL via INTRAVENOUS

## 2015-11-03 MED ORDER — NITROGLYCERIN 0.4 MG SL SUBL
0.4000 mg | SUBLINGUAL_TABLET | SUBLINGUAL | Status: DC | PRN
  Administered 2015-11-03 (×3): 0.4 mg via SUBLINGUAL
  Filled 2015-11-03: qty 1

## 2015-11-03 MED ORDER — IOPAMIDOL (ISOVUE-370) INJECTION 76%
100.0000 mL | Freq: Once | INTRAVENOUS | Status: AC | PRN
  Administered 2015-11-03: 100 mL via INTRAVENOUS

## 2015-11-03 NOTE — ED Notes (Signed)
Unable to collect labs PT in xray 

## 2015-11-03 NOTE — ED Notes (Signed)
Pt reports chest heaviness with L arm numbness and SOB intermittently since Friday.  Pt reports chest heaviness is worse with exertion.  Denies noticing swelling in her ankles at this time.  Pt is NAD.

## 2015-11-03 NOTE — ED Provider Notes (Signed)
CSN: [id number]     Arrival date & time 11/03/15  1331 History   First MD Initiated Contact with Patient 11/03/15 1406     Chief Complaint  Patient presents with  . Chest Pain     (Consider location/radiation/quality/duration/timing/severity/associated sxs/prior Treatment) Patient is a 45 y.o. female presenting with chest pain. The history is provided by the patient.  Chest Pain Pain location:  Substernal area Pain quality: pressure and tightness   Pain radiates to:  Does not radiate Pain radiates to the back: no   Pain severity:  Mild Onset quality:  Gradual Duration:  3 days Timing:  Intermittent Progression:  Unchanged Relieved by:  Nothing Worsened by:  Nothing tried Ineffective treatments:  None tried (tylenol) Associated symptoms: dizziness, numbness (left arm), shortness of breath and syncope (hx of POTS)   Associated symptoms: no abdominal pain, no back pain, no cough, no fever, no headache, no lower extremity edema, no nausea, not vomiting and no weakness   Risk factors: no immobilization, no prior DVT/PE and no surgery    Melissa [name] is a 45 y.o. female with PMH significant for POTS followed by [facility] who presents with 3 day history of gradual onset, intermittent, non-radiating chest heaviness/tightness.  She reports she was sitting watching TV when it began.  She also reports left arm numbness at onset that has remained.  She reports it will come and go lasting anywhere from 1.5-3.5 hours.  This current episode has been ongoing for the past 3.5 hours.  No aggravating or modifying factors.  It does not get worse with exertion, but she does feel much more short of breath with exertion.  Last echo, 2014, normal.  No hx of DVT/PE, recent surgery, cardiac history, family cardiac history, tobacco use, or drug use.  No unilateral leg swelling, headache, visual disturbance, gait disturbance, f/c, cough, N/V/D, abdominal pain, urinary symptoms.  Past Medical History   Diagnosis Date  . Bipolar disorder ([place])   . Fibromyalgia     on disability  . Migraine   . Panic attack   . Muscle spasm   . Uterine perforation     hx of  . Syncope     recurrent  . POTS (postural orthostatic tachycardia syndrome)   . Pain of right side of body     chronic  . Arthritis    Past Surgical History  Procedure Laterality Date  . Dilation and curettage of uterus    . Cataract extraction  dec 2014  . Lasik  dec 2014   Family History  Problem Relation Age of Onset  . Diabetes Mother   . Hypertension Mother   . Cancer Father     brain tumor  . Cancer Maternal Grandmother   . Cancer Maternal Grandfather   . Cancer Paternal Grandmother   . Cancer Paternal Grandfather   . Migraines Neg Hx    Social History  Substance Use Topics  . Smoking status: Never Smoker   . Smokeless tobacco: Never Used  . Alcohol Use: No     Comment: 1 holiday drink yearly   OB History    No data available     Review of Systems  Constitutional: Negative for fever.  Respiratory: Positive for chest tightness and shortness of breath. Negative for cough.   Cardiovascular: Positive for chest pain and syncope (hx of POTS).  Gastrointestinal: Negative for nausea, vomiting, abdominal pain and diarrhea.  Genitourinary: Negative for dysuria, urgency and hematuria.  Musculoskeletal: Negative for back  pain.  Neurological: Positive for dizziness and numbness (left arm). Negative for weakness and headaches.  All other systems reviewed and are negative.     Allergies  Abilify; Depakote; Erythromycin base; Lamotrigine; Seroquel; Tetanus toxoids; Tramadol; Ziprasidone hcl; Azithromycin; Lithium; Penicillins; Pregabalin; and Sulfa antibiotics  Home Medications   Prior to Admission medications   Medication Sig Start Date End Date Taking? Authorizing Provider  Cholecalciferol (VITAMIN D3) 1000 UNITS CAPS Take 2,000 Units by mouth daily.    Yes Historical Provider, MD  clonazePAM (KLONOPIN)  1 MG tablet Take 1 tablet (1 mg total) by mouth 2 (two) times daily as needed for anxiety. 10/23/15  Yes Lucille Passy, MD  diclofenac sodium (VOLTAREN) 1 % GEL Apply to affected area three to four times daily. Patient taking differently: Apply 2 g topically 4 (four) times daily as needed (pain). Apply to affected area three to four times daily. 09/20/15  Yes Olivia Canter Sam, PA-C  divalproex (DEPAKOTE) 250 MG DR tablet Take 250 mg by mouth 2 (two) times daily. 10/21/15  Yes Historical Provider, MD  fexofenadine (ALLEGRA) 180 MG tablet Take 180 mg by mouth daily.   Yes Historical Provider, MD  gabapentin (NEURONTIN) 600 MG tablet TAKE ONE (1) TABLET THREE (3) TIMES EACH DAY 07/25/15  Yes Lucille Passy, MD  HYDROcodone-acetaminophen (NORCO) 5-325 MG tablet Take 1 tablet by mouth every 6 (six) hours as needed for moderate pain. 09/23/15  Yes Lucille Passy, MD  medroxyPROGESTERone (DEPO-PROVERA) 150 MG/ML injection Inject 150 mg into the muscle every 3 (three) months.   Yes Historical Provider, MD  ondansetron (ZOFRAN-ODT) 4 MG disintegrating tablet Take 4 mg by mouth every 8 (eight) hours as needed for nausea or vomiting.    Yes Historical Provider, MD  ranitidine (ZANTAC) 150 MG tablet Take 150 mg by mouth daily.   Yes Historical Provider, MD  risperiDONE (RISPERDAL) 0.5 MG tablet Take 0.5 mg by mouth at bedtime.   Yes Historical Provider, MD  tiZANidine (ZANAFLEX) 4 MG tablet TAKE 1 TABLET BY MOUTH EVERY 8 HOURS AS NEEDED FOR MUSCLE SPASMS 09/23/15  Yes Lucille Passy, MD  vitamin B-12 (CYANOCOBALAMIN) 1000 MCG tablet Take 1,000 mcg by mouth daily.   Yes Historical Provider, MD  naproxen (NAPROSYN) 500 MG tablet Take 1 tablet (500 mg total) by mouth 2 (two) times daily. Patient not taking: Reported on 10/16/2015 09/20/15   Olivia Canter Sam, PA-C  predniSONE (DELTASONE) 20 MG tablet Take 2 tablets (40 mg total) by mouth daily. Patient not taking: Reported on 10/16/2015 09/20/15   Olivia Canter Sam, PA-C  risperiDONE (RISPERDAL) 0.25  MG tablet Take 2 tablets (0.5 mg total) by mouth 2 (two) times daily. Patient not taking: Reported on 10/16/2015 08/14/15   Rainey Pines, MD   BP 92/70 mmHg  Pulse 107  Temp(Src) 97.9 F (36.6 C) (Oral)  Resp 17  SpO2 95% Physical Exam  Constitutional: She is oriented to person, place, and time. She appears well-developed and well-nourished.  Non-toxic appearance. She does not have a sickly appearance. She does not appear ill.  HENT:  Head: Normocephalic and atraumatic.  Mouth/Throat: Oropharynx is clear and moist.  Eyes: Conjunctivae are normal. Pupils are equal, round, and reactive to light.  Neck: Normal range of motion. Neck supple.  Cardiovascular: Regular rhythm and normal heart sounds.  Tachycardia present.   HR 122  Pulmonary/Chest: Effort normal and breath sounds normal. No accessory muscle usage or stridor. No respiratory distress. She has no wheezes. She  has no rhonchi. She has no rales.  98% on RA.   Abdominal: Soft. Bowel sounds are normal. She exhibits no distension. There is no tenderness. There is no rebound and no guarding.  Musculoskeletal: Normal range of motion.  Lymphadenopathy:    She has no cervical adenopathy.  Neurological: She is alert and oriented to person, place, and time.  Mental Status:   AOx3.  Speech clear without dysarthria. Cranial Nerves:  I-not tested  II-PERRLA  III, IV, VI-EOMs intact  V-temporal and masseter strength intact  VII-symmetrical facial movements intact, no facial droop  VIII-hearing grossly intact bilaterally  IX, X-gag intact  XI-strength of sternomastoid and trapezius muscles 5/5  XII-tongue midline Motor:   Good muscle bulk and tone  Strength 5/5 bilaterally in upper and lower extremities   Cerebellar--intact RAMs, finger to nose intact bilaterally.    No pronator drift Sensory:  Intact in upper and lower extremities. She states LUE feels slightly different than RUE to light touch.    Skin: Skin is warm and dry.   Psychiatric: She has a normal mood and affect. Her behavior is normal.    ED Course  Procedures (including critical care time) Labs Review Labs Reviewed  BASIC METABOLIC PANEL - Abnormal; Notable for the following:    Chloride 112 (*)    CO2 21 (*)    Glucose, Bld 101 (*)    Creatinine, Ser 1.12 (*)    Calcium 8.7 (*)    GFR calc non Af Amer 58 (*)    All other components within normal limits  D-DIMER, QUANTITATIVE (NOT AT [facility]) - Abnormal; Notable for the following:    D-Dimer, Quant 0.54 (*)    All other components within normal limits  CBC  I-STAT TROPOININ, ED    Imaging Review Dg Chest 2 View  11/03/2015  CLINICAL DATA:  Central chest pain and shortness of breath for 3 days EXAM: CHEST  2 VIEW COMPARISON:  03/02/2013 FINDINGS: The heart size and mediastinal contours are within normal limits. Both lungs are clear. The visualized skeletal structures are unremarkable. IMPRESSION: No active cardiopulmonary disease. Electronically Signed   By: Inez Catalina M.D.   On: 11/03/2015 14:37   Ct Angio Chest Pe W/cm &/or Wo Cm  11/03/2015  CLINICAL DATA:  Shortness of breath starting 3 days ago. Mildly elevated D-dimer level. EXAM: CT ANGIOGRAPHY CHEST WITH CONTRAST TECHNIQUE: Multidetector CT imaging of the chest was performed using the standard protocol during bolus administration of intravenous contrast. Multiplanar CT image reconstructions and MIPs were obtained to evaluate the vascular anatomy. CONTRAST:  100 cc Isovue 370 COMPARISON:  11/03/2015 FINDINGS: Mediastinum/Nodes: No filling defect is identified in the pulmonary arterial tree to suggest pulmonary embolus. No thoracic aortic dissection or acute thoracic aortic findings identified. No thoracic adenopathy identified. Borderline cardiomegaly. Trace right pleural effusion. Lungs/Pleura: Mild subsegmental atelectasis in both lower lobes. There is linear subsegmental atelectasis posteriorly in the lingula. Upper abdomen: 10 mm hypodense  lesion posteriorly in segment 7, no change from 01/24/2014, likely benign. Musculoskeletal: Unremarkable Review of the MIP images confirms the above findings. IMPRESSION: 1. No filling defect is identified in the pulmonary arterial tree to suggest pulmonary embolus. 2. Borderline cardiomegaly. 3. Trace right pleural effusion. 4. Mild subsegmental atelectasis in both lower lobes and in the lingula. Electronically Signed   By: Van Clines M.D.   On: 11/03/2015 16:38   I have personally reviewed and evaluated these images and lab results as part of my medical decision-making.  EKG Interpretation   Date/Time:  Monday Nov 03 2015 13:42:25 EDT Ventricular Rate:  122 PR Interval:  135 QRS Duration: 74 QT Interval:  325 QTC Calculation: 463 R Axis:   48 Text Interpretation:  Sinus tachycardia Probable left atrial enlargement  Low voltage, precordial leads Baseline wander in lead(s) V4 V5 V6  Confirmed by KOHUT  MD, Frederick ([id number]) on 11/03/2015 3:29:29 PM      MDM   Final diagnoses:  Chest heaviness   Patient presents with intermittent chest heaviness/tightness and left arm numbness x 3 days.  Followed by [facility] for POTS, chest heaviness, and DOE.  She is tachycardic, HR 122, which could be related to her POTS. Otherwise, vitals normal.  No hypoxia. Lungs CTAB, Heart sounds with tachycardia, no murmur, abdomen soft and benign.  Normal neuro exam, although patient does state that sensation to light touch is slightly different on the LUE.  No other neurological complaints.  No indication for further imaging at this time.  DDx includes ACS, PE, AD.  HEART Score-- 2.  Will obtain EKG, labs, and CXR.  Will give ASA, nitro, and IVF.  Troponin 0.01.  EKG shows sinus tachycardia.  CXR unremarkable. Slight bump in Cr 1.12.  Hgb 14.3, no white count.  D-dimer positive, 0.54.  Will obtain CTA.  Improvement of pain.   CTA negative.  This seems like atypical cardiac chest pain and has been  going on for the past 3 days.  Given that, single troponin sufficient.  Patient has good follow up with [facility].  Discussed return precautions discussed.  Patient agrees and acknowledges the above plan for discharge.   Case has been discussed with Dr. Wilson Singer who agrees with the above plan for discharge.       Gloriann Loan, PA-C 11/03/15 2107  Virgel Manifold, MD 11/12/15 [phone]

## 2015-11-03 NOTE — Discharge Instructions (Signed)
Nonspecific Chest Pain  °Chest pain can be caused by many different conditions. There is always a chance that your pain could be related to something serious, such as a heart attack or a blood clot in your lungs. Chest pain can also be caused by conditions that are not life-threatening. If you have chest pain, it is very important to follow up with your health care provider. °CAUSES  °Chest pain can be caused by: °· Heartburn. °· Pneumonia or bronchitis. °· Anxiety or stress. °· Inflammation around your heart (pericarditis) or lung (pleuritis or pleurisy). °· A blood clot in your lung. °· A collapsed lung (pneumothorax). It can develop suddenly on its own (spontaneous pneumothorax) or from trauma to the chest. °· Shingles infection (varicella-zoster virus). °· Heart attack. °· Damage to the bones, muscles, and cartilage that make up your chest wall. This can include: °¨ Bruised bones due to injury. °¨ Strained muscles or cartilage due to frequent or repeated coughing or overwork. °¨ Fracture to one or more ribs. °¨ Sore cartilage due to inflammation (costochondritis). °RISK FACTORS  °Risk factors for chest pain may include: °· Activities that increase your risk for trauma or injury to your chest. °· Respiratory infections or conditions that cause frequent coughing. °· Medical conditions or overeating that can cause heartburn. °· Heart disease or family history of heart disease. °· Conditions or health behaviors that increase your risk of developing a blood clot. °· Having had chicken pox (varicella zoster). °SIGNS AND SYMPTOMS °Chest pain can feel like: °· Burning or tingling on the surface of your chest or deep in your chest. °· Crushing, pressure, aching, or squeezing pain. °· Dull or sharp pain that is worse when you move, cough, or take a deep breath. °· Pain that is also felt in your back, neck, shoulder, or arm, or pain that spreads to any of these areas. °Your chest pain may come and go, or it may stay  constant. °DIAGNOSIS °Lab tests or other studies may be needed to find the cause of your pain. Your health care provider may have you take a test called an ambulatory ECG (electrocardiogram). An ECG records your heartbeat patterns at the time the test is performed. You may also have other tests, such as: °· Transthoracic echocardiogram (TTE). During echocardiography, sound waves are used to create a picture of all of the heart structures and to look at how blood flows through your heart. °· Transesophageal echocardiogram (TEE). This is a more advanced imaging test that obtains images from inside your body. It allows your health care provider to see your heart in finer detail. °· Cardiac monitoring. This allows your health care provider to monitor your heart rate and rhythm in real time. °· Holter monitor. This is a portable device that records your heartbeat and can help to diagnose abnormal heartbeats. It allows your health care provider to track your heart activity for several days, if needed. °· Stress tests. These can be done through exercise or by taking medicine that makes your heart beat more quickly. °· Blood tests. °· Imaging tests. °TREATMENT  °Your treatment depends on what is causing your chest pain. Treatment may include: °· Medicines. These may include: °¨ Acid blockers for heartburn. °¨ Anti-inflammatory medicine. °¨ Pain medicine for inflammatory conditions. °¨ Antibiotic medicine, if an infection is present. °¨ Medicines to dissolve blood clots. °¨ Medicines to treat coronary artery disease. °· Supportive care for conditions that do not require medicines. This may include: °¨ Resting. °¨ Applying heat   or cold packs to injured areas. °¨ Limiting activities until pain decreases. °HOME CARE INSTRUCTIONS °· If you were prescribed an antibiotic medicine, finish it all even if you start to feel better. °· Avoid any activities that bring on chest pain. °· Do not use any tobacco products, including  cigarettes, chewing tobacco, or electronic cigarettes. If you need help quitting, ask your health care provider. °· Do not drink alcohol. °· Take medicines only as directed by your health care provider. °· Keep all follow-up visits as directed by your health care provider. This is important. This includes any further testing if your chest pain does not go away. °· If heartburn is the cause for your chest pain, you may be told to keep your head raised (elevated) while sleeping. This reduces the chance that acid will go from your stomach into your esophagus. °· Make lifestyle changes as directed by your health care provider. These may include: °¨ Getting regular exercise. Ask your health care provider to suggest some activities that are safe for you. °¨ Eating a heart-healthy diet. A registered dietitian can help you to learn healthy eating options. °¨ Maintaining a healthy weight. °¨ Managing diabetes, if necessary. °¨ Reducing stress. °SEEK MEDICAL CARE IF: °· Your chest pain does not go away after treatment. °· You have a rash with blisters on your chest. °· You have a fever. °SEEK IMMEDIATE MEDICAL CARE IF:  °· Your chest pain is worse. °· You have an increasing cough, or you cough up blood. °· You have severe abdominal pain. °· You have severe weakness. °· You faint. °· You have chills. °· You have sudden, unexplained chest discomfort. °· You have sudden, unexplained discomfort in your arms, back, neck, or jaw. °· You have shortness of breath at any time. °· You suddenly start to sweat, or your skin gets clammy. °· You feel nauseous or you vomit. °· You suddenly feel light-headed or dizzy. °· Your heart begins to beat quickly, or it feels like it is skipping beats. °These symptoms may represent a serious problem that is an emergency. Do not wait to see if the symptoms will go away. Get medical help right away. Call your local emergency services (911 in the [place] Do not drive yourself to the hospital. °  °This  information is not intended to replace advice given to you by your health care provider. Make sure you discuss any questions you have with your health care provider. °  °Document Released: 03/17/2005 Document Revised: 06/28/2014 Document Reviewed: 01/11/2014 °Elsevier Interactive Patient Education ©2016 [employer] ° °

## 2015-11-03 NOTE — Telephone Encounter (Signed)
[facility]     Patient Name: Melissa [name] Initial Comment Chest and arm pains off and on since Friday, wants appt at [facility] location, but pt of [facility]  DOB: 05-01-1971      Nurse Assessment  Nurse: Luther Parody, RN, Malachy Mood Date/Time (Eastern Time): 11/03/2015 12:40:47 PM  Confirm and document reason for call. If symptomatic, describe symptoms. You must click the next button to save text entered. ---Caller states that she has been intermittent midsternal cp since Friday and that the pain has been constant for the past 2 hrs. Pt has also been experiencing sob with exertion.  Has the patient traveled out of the country within the last 30 days? ---Not Applicable  Does the patient have any new or worsening symptoms? ---Yes  Will a triage be completed? ---Yes  Related visit to physician within the last 2 weeks? ---No  Does the PT have any chronic conditions? (i.e. diabetes, asthma, etc.) ---Yes  List chronic conditions. ---POTS, fibromyalgia  Is the patient pregnant or possibly pregnant? (Ask all females between the ages of 67-55) ---No  Is this a behavioral health or substance abuse call? ---No    Guidelines     Guideline Title Affirmed Question Affirmed Notes   Chest Pain [1] Chest pain lasts > 5 minutes AND [2] described as crushing, pressure-like, or heavy    Final Disposition User   Call EMS 911 Now Luther Parody, RN, [employer] with [facility] in the office and advised that pt disagrees with the instructions.   Referrals   GO TO FACILITY OTHER - SPECIFY   Disagree/Comply: Disagree   Disagree/Comply Reason: Disagree with instructions

## 2015-11-03 NOTE — Telephone Encounter (Signed)
Cheryl with [facility] called and pt refused to call 911 and not sure if pt was going to ED; I spoke with pt and pt had CP on and off since 10/31/15; today CP constant for 2 hours with SOB upon exertion. Pt husband is taking pt to [facility] ED. FYI to Dr Deborra Medina.

## 2015-11-03 NOTE — ED Notes (Signed)
Pt c/o intermittent L chest heaviness, L arm numbness, dizziness, and SOB since Friday. Denies N/V/D. Denies radiation to back. A&Ox4. Pt has hx of chest heaviness but not with arm numbness.

## 2015-11-05 ENCOUNTER — Encounter (HOSPITAL_COMMUNITY): Payer: Self-pay

## 2015-11-05 ENCOUNTER — Emergency Department (HOSPITAL_COMMUNITY): Payer: PPO

## 2015-11-05 ENCOUNTER — Emergency Department (HOSPITAL_COMMUNITY)
Admission: EM | Admit: 2015-11-05 | Discharge: 2015-11-05 | Disposition: A | Discharge status: Home / Self Care | Payer: PPO | Attending: Emergency Medicine | Admitting: Emergency Medicine

## 2015-11-05 DIAGNOSIS — M1990 Unspecified osteoarthritis, unspecified site: Secondary | ICD-10-CM | POA: Diagnosis not present

## 2015-11-05 DIAGNOSIS — R0602 Shortness of breath: Secondary | ICD-10-CM | POA: Diagnosis not present

## 2015-11-05 DIAGNOSIS — F319 Bipolar disorder, unspecified: Secondary | ICD-10-CM | POA: Insufficient documentation

## 2015-11-05 DIAGNOSIS — R0789 Other chest pain: Secondary | ICD-10-CM | POA: Diagnosis not present

## 2015-11-05 DIAGNOSIS — G90A Postural orthostatic tachycardia syndrome (POTS): Secondary | ICD-10-CM

## 2015-11-05 DIAGNOSIS — Z7952 Long term (current) use of systemic steroids: Secondary | ICD-10-CM | POA: Diagnosis not present

## 2015-11-05 DIAGNOSIS — Z79899 Other long term (current) drug therapy: Secondary | ICD-10-CM | POA: Diagnosis not present

## 2015-11-05 DIAGNOSIS — R000 Tachycardia, unspecified: Secondary | ICD-10-CM

## 2015-11-05 DIAGNOSIS — Z791 Long term (current) use of non-steroidal anti-inflammatories (NSAID): Secondary | ICD-10-CM | POA: Insufficient documentation

## 2015-11-05 DIAGNOSIS — Z79891 Long term (current) use of opiate analgesic: Secondary | ICD-10-CM | POA: Diagnosis not present

## 2015-11-05 DIAGNOSIS — I498 Other specified cardiac arrhythmias: Secondary | ICD-10-CM | POA: Diagnosis not present

## 2015-11-05 DIAGNOSIS — R079 Chest pain, unspecified: Secondary | ICD-10-CM | POA: Diagnosis not present

## 2015-11-05 DIAGNOSIS — I951 Orthostatic hypotension: Secondary | ICD-10-CM

## 2015-11-05 LAB — URINALYSIS, ROUTINE W REFLEX MICROSCOPIC
BILIRUBIN URINE: NEGATIVE
Glucose, UA: NEGATIVE mg/dL
Ketones, ur: NEGATIVE mg/dL
Leukocytes, UA: NEGATIVE
Nitrite: NEGATIVE
PROTEIN: NEGATIVE mg/dL
Specific Gravity, Urine: 1.014 (ref 1.005–1.030)
pH: 5 (ref 5.0–8.0)

## 2015-11-05 LAB — BASIC METABOLIC PANEL
Anion gap: 9 (ref 5–15)
BUN: 10 mg/dL (ref 6–20)
CALCIUM: 9 mg/dL (ref 8.9–10.3)
CO2: 19 mmol/L — ABNORMAL LOW (ref 22–32)
CREATININE: 1.09 mg/dL — AB (ref 0.44–1.00)
Chloride: 110 mmol/L (ref 101–111)
GFR calc Af Amer: >60 mL/min (ref >60)
GFR, EST NON AFRICAN AMERICAN: 60 mL/min — AB (ref >60)
Glucose, Bld: 95 mg/dL (ref 65–99)
POTASSIUM: 3.8 mmol/L (ref 3.5–5.1)
SODIUM: 138 mmol/L (ref 135–145)

## 2015-11-05 LAB — I-STAT TROPONIN, ED: TROPONIN I, POC: 0 ng/mL (ref 0.00–0.08)

## 2015-11-05 LAB — URINE MICROSCOPIC-ADD ON

## 2015-11-05 LAB — CBC
HCT: 42.9 % (ref 36.0–46.0)
Hemoglobin: 14.4 g/dL (ref 12.0–15.0)
MCH: 28.7 pg (ref 26.0–34.0)
MCHC: 33.6 g/dL (ref 30.0–36.0)
MCV: 85.5 fL (ref 78.0–100.0)
PLATELETS: 240 10*3/uL (ref 150–400)
RBC: 5.02 MIL/uL (ref 3.87–5.11)
RDW: 13.9 % (ref 11.5–15.5)
WBC: 8.8 10*3/uL (ref 4.0–10.5)

## 2015-11-05 LAB — VALPROIC ACID LEVEL: VALPROIC ACID LVL: 42 ug/mL — AB (ref 50.0–100.0)

## 2015-11-05 MED ORDER — SODIUM CHLORIDE 0.9 % IV BOLUS (SEPSIS)
1000.0000 mL | Freq: Once | INTRAVENOUS | Status: AC
  Administered 2015-11-05: 1000 mL via INTRAVENOUS

## 2015-11-05 NOTE — ED Notes (Signed)
Patient ambulated to the restroom without assistance. Patient given warm blanket per request.

## 2015-11-05 NOTE — ED Provider Notes (Signed)
CSN: [id number]     Arrival date & time 11/05/15  1423 History   First MD Initiated Contact with Patient 11/05/15 1645     Chief Complaint  Patient presents with  . Chest Pain  . Dizziness     HPI Patient presents with left-sided chest pain dizziness. States she's had it for the last week. States it initially was more episodic but now was become more constant. It is dull in her left chest. Some radiation to left arm. States she also felt dizzy at times. No coronary artery disease history. Recently started on Depakote for bipolar disorder. Also has a history of postural orthostatic tachycardia syndrome. She has not on medications for it and was diagnosed at [facility]. States she felt as if she has been dehydrated. No dysuria. No swelling or legs. Seen in the ER 2 days ago and had negative CTA and reassuring labs and time. States she has been more short of breath with exertion   Past Medical History  Diagnosis Date  . Bipolar disorder ([place])   . Fibromyalgia     on disability  . Migraine   . Panic attack   . Muscle spasm   . Uterine perforation     hx of  . Syncope     recurrent  . POTS (postural orthostatic tachycardia syndrome)   . Pain of right side of body     chronic  . Arthritis    Past Surgical History  Procedure Laterality Date  . Dilation and curettage of uterus    . Cataract extraction  dec 2014  . Lasik  dec 2014   Family History  Problem Relation Age of Onset  . Diabetes Mother   . Hypertension Mother   . Cancer Father     brain tumor  . Cancer Maternal Grandmother   . Cancer Maternal Grandfather   . Cancer Paternal Grandmother   . Cancer Paternal Grandfather   . Migraines Neg Hx    Social History  Substance Use Topics  . Smoking status: Never Smoker   . Smokeless tobacco: Never Used  . Alcohol Use: No     Comment: 1 holiday drink yearly   OB History    No data available     Review of Systems  Constitutional: Negative for activity change and appetite  change.  Eyes: Negative for pain.  Respiratory: Positive for shortness of breath. Negative for chest tightness.   Cardiovascular: Positive for chest pain. Negative for leg swelling.  Gastrointestinal: Negative for nausea, vomiting, abdominal pain and diarrhea.  Genitourinary: Negative for flank pain.  Musculoskeletal: Negative for back pain and neck stiffness.  Skin: Negative for rash.  Neurological: Positive for light-headedness. Negative for weakness, numbness and headaches.  Psychiatric/Behavioral: Negative for behavioral problems.      Allergies  Abilify; Depakote; Erythromycin base; Lamotrigine; Seroquel; Tetanus toxoids; Tramadol; Ziprasidone hcl; Azithromycin; Lithium; Penicillins; Pregabalin; and Sulfa antibiotics  Home Medications   Prior to Admission medications   Medication Sig Start Date End Date Taking? Authorizing Provider  Cholecalciferol (VITAMIN D3) 1000 UNITS CAPS Take 2,000 Units by mouth daily.    Yes Historical Provider, MD  clonazePAM (KLONOPIN) 1 MG tablet Take 1 tablet (1 mg total) by mouth 2 (two) times daily as needed for anxiety. 10/23/15  Yes Lucille Passy, MD  diclofenac sodium (VOLTAREN) 1 % GEL Apply to affected area three to four times daily. Patient taking differently: Apply 2 g topically 4 (four) times daily as needed (pain). Apply to affected  area three to four times daily. 09/20/15  Yes Olivia Canter Sam, PA-C  divalproex (DEPAKOTE) 250 MG DR tablet Take 250 mg by mouth 2 (two) times daily.   Yes Historical Provider, MD  fexofenadine (ALLEGRA) 180 MG tablet Take 180 mg by mouth daily.   Yes Historical Provider, MD  gabapentin (NEURONTIN) 600 MG tablet TAKE ONE (1) TABLET THREE (3) TIMES EACH DAY 07/25/15  Yes Lucille Passy, MD  HYDROcodone-acetaminophen (NORCO) 5-325 MG tablet Take 1 tablet by mouth every 6 (six) hours as needed for moderate pain. 09/23/15  Yes Lucille Passy, MD  medroxyPROGESTERone (DEPO-PROVERA) 150 MG/ML injection Inject 150 mg into the muscle  every 3 (three) months.   Yes Historical Provider, MD  ondansetron (ZOFRAN-ODT) 4 MG disintegrating tablet Take 4 mg by mouth every 8 (eight) hours as needed for nausea or vomiting.    Yes Historical Provider, MD  ranitidine (ZANTAC) 150 MG tablet Take 150 mg by mouth daily.   Yes Historical Provider, MD  risperiDONE (RISPERDAL) 0.5 MG tablet Take 0.5 mg by mouth at bedtime.   Yes Historical Provider, MD  tiZANidine (ZANAFLEX) 4 MG tablet TAKE 1 TABLET BY MOUTH EVERY 8 HOURS AS NEEDED FOR MUSCLE SPASMS 09/23/15  Yes Lucille Passy, MD  vitamin B-12 (CYANOCOBALAMIN) 1000 MCG tablet Take 1,000 mcg by mouth daily.   Yes Historical Provider, MD  naproxen (NAPROSYN) 500 MG tablet Take 1 tablet (500 mg total) by mouth 2 (two) times daily. Patient not taking: Reported on 10/16/2015 09/20/15   Olivia Canter Sam, PA-C  predniSONE (DELTASONE) 20 MG tablet Take 2 tablets (40 mg total) by mouth daily. Patient not taking: Reported on 10/16/2015 09/20/15   Olivia Canter Sam, PA-C  risperiDONE (RISPERDAL) 0.25 MG tablet Take 2 tablets (0.5 mg total) by mouth 2 (two) times daily. Patient not taking: Reported on 10/16/2015 08/14/15   Rainey Pines, MD   BP 118/67 mmHg  Pulse 85  Temp(Src) 98.1 F (36.7 C) (Oral)  Resp 16  SpO2 100% Physical Exam  Constitutional: She is oriented to person, place, and time. She appears well-developed and well-nourished.  HENT:  Head: Normocephalic and atraumatic.  Eyes: EOM are normal. Pupils are equal, round, and reactive to light.  Neck: No JVD present.  Cardiovascular: Normal rate and regular rhythm.   Mild tachycardia  Pulmonary/Chest: Effort normal and breath sounds normal. No respiratory distress. She has no wheezes. She has no rales.  Abdominal: Soft. Bowel sounds are normal. She exhibits no distension. There is no rebound.  Musculoskeletal: Normal range of motion.  Neurological: She is alert and oriented to person, place, and time.  Skin: Skin is warm.  Psychiatric: She has a normal  mood and affect. Her speech is normal.  Nursing note and vitals reviewed.   ED Course  Procedures (including critical care time) Labs Review Labs Reviewed  BASIC METABOLIC PANEL - Abnormal; Notable for the following:    CO2 19 (*)    Creatinine, Ser 1.09 (*)    GFR calc non Af Amer 60 (*)    All other components within normal limits  URINALYSIS, ROUTINE W REFLEX MICROSCOPIC (NOT AT [facility]) - Abnormal; Notable for the following:    Hgb urine dipstick SMALL (*)    All other components within normal limits  VALPROIC ACID LEVEL - Abnormal; Notable for the following:    Valproic Acid Lvl 42 (*)    All other components within normal limits  URINE MICROSCOPIC-ADD ON - Abnormal; Notable for the  following:    Squamous Epithelial / LPF 0-5 (*)    Bacteria, UA RARE (*)    All other components within normal limits  CBC  I-STAT TROPOININ, ED    Imaging Review Dg Chest 2 View  11/05/2015  CLINICAL DATA:  Shortness of breath with chest heaviness. Left arm numbness EXAM: CHEST  2 VIEW COMPARISON:  Chest radiograph Nov 03, 2015; chest CT Nov 03, 2015 FINDINGS: There is no edema or consolidation. Heart size and pulmonary vascularity are normal. No adenopathy. No bone lesions. No pneumothorax. IMPRESSION: No edema or consolidation. Electronically Signed   By: Lowella Grip III M.D.   On: 11/05/2015 14:58   I have personally reviewed and evaluated these images and lab results as part of my medical decision-making.   EKG Interpretation   Date/Time:  Wednesday Nov 05 2015 14:33:03 EDT Ventricular Rate:  117 PR Interval:  148 QRS Duration: 82 QT Interval:  300 QTC Calculation: 418 R Axis:   91 Text Interpretation:  Sinus tachycardia Borderline right axis deviation No  significant change since last tracing Confirmed by NGUYEN, EMILY ([id number])  on 11/05/2015 2:36:51 PM Also confirmed by Alfonse Spruce, EMILY ([id number]), editor  Stout CT, Leda Gauze [phone])  on 11/05/2015 2:40:15 PM      MDM   Final  diagnoses:  POTS (postural orthostatic tachycardia syndrome)  Chest pain, unspecified chest pain type    Patient with chest pain. Recent visit 2 days ago for same. Had negative CTA at that time. EKG reassuring and troponin negative. Does have a history of pots and states she may be dehydrated. Feels better after IV fluid. Urine reassuring. Discharge home.    Davonna Belling, MD 11/05/15 2018

## 2015-11-05 NOTE — ED Notes (Signed)
Delay in urine sample, patient aware and has fluids running. Will give sample as soon as possible

## 2015-11-05 NOTE — Discharge Instructions (Signed)
Nonspecific Chest Pain  Chest pain can be caused by many different conditions. There is always a chance that your pain could be related to something serious, such as a heart attack or a blood clot in your lungs. Chest pain can also be caused by conditions that are not life-threatening. If you have chest pain, it is very important to follow up with your health care provider. CAUSES  Chest pain can be caused by:  Heartburn.  Pneumonia or bronchitis.  Anxiety or stress.  Inflammation around your heart (pericarditis) or lung (pleuritis or pleurisy).  A blood clot in your lung.  A collapsed lung (pneumothorax). It can develop suddenly on its own (spontaneous pneumothorax) or from trauma to the chest.  Shingles infection (varicella-zoster virus).  Heart attack.  Damage to the bones, muscles, and cartilage that make up your chest wall. This can include:  Bruised bones due to injury.  Strained muscles or cartilage due to frequent or repeated coughing or overwork.  Fracture to one or more ribs.  Sore cartilage due to inflammation (costochondritis). RISK FACTORS  Risk factors for chest pain may include:  Activities that increase your risk for trauma or injury to your chest.  Respiratory infections or conditions that cause frequent coughing.  Medical conditions or overeating that can cause heartburn.  Heart disease or family history of heart disease.  Conditions or health behaviors that increase your risk of developing a blood clot.  Having had chicken pox (varicella zoster). SIGNS AND SYMPTOMS Chest pain can feel like:  Burning or tingling on the surface of your chest or deep in your chest.  Crushing, pressure, aching, or squeezing pain.  Dull or sharp pain that is worse when you move, cough, or take a deep breath.  Pain that is also felt in your back, neck, shoulder, or arm, or pain that spreads to any of these areas. Your chest pain may come and go, or it may stay  constant. DIAGNOSIS Lab tests or other studies may be needed to find the cause of your pain. Your health care provider may have you take a test called an ambulatory ECG (electrocardiogram). An ECG records your heartbeat patterns at the time the test is performed. You may also have other tests, such as:  Transthoracic echocardiogram (TTE). During echocardiography, sound waves are used to create a picture of all of the heart structures and to look at how blood flows through your heart.  Transesophageal echocardiogram (TEE).This is a more advanced imaging test that obtains images from inside your body. It allows your health care provider to see your heart in finer detail.  Cardiac monitoring. This allows your health care provider to monitor your heart rate and rhythm in real time.  Holter monitor. This is a portable device that records your heartbeat and can help to diagnose abnormal heartbeats. It allows your health care provider to track your heart activity for several days, if needed.  Stress tests. These can be done through exercise or by taking medicine that makes your heart beat more quickly.  Blood tests.  Imaging tests. TREATMENT  Your treatment depends on what is causing your chest pain. Treatment may include:  Medicines. These may include:  Acid blockers for heartburn.  Anti-inflammatory medicine.  Pain medicine for inflammatory conditions.  Antibiotic medicine, if an infection is present.  Medicines to dissolve blood clots.  Medicines to treat coronary artery disease.  Supportive care for conditions that do not require medicines. This may include:  Resting.  Applying heat  or cold packs to injured areas.  Limiting activities until pain decreases. HOME CARE INSTRUCTIONS  If you were prescribed an antibiotic medicine, finish it all even if you start to feel better.  Avoid any activities that bring on chest pain.  Do not use any tobacco products, including  cigarettes, chewing tobacco, or electronic cigarettes. If you need help quitting, ask your health care provider.  Do not drink alcohol.  Take medicines only as directed by your health care provider.  Keep all follow-up visits as directed by your health care provider. This is important. This includes any further testing if your chest pain does not go away.  If heartburn is the cause for your chest pain, you may be told to keep your head raised (elevated) while sleeping. This reduces the chance that acid will go from your stomach into your esophagus.  Make lifestyle changes as directed by your health care provider. These may include:  Getting regular exercise. Ask your health care provider to suggest some activities that are safe for you.  Eating a heart-healthy diet. A registered dietitian can help you to learn healthy eating options.  Maintaining a healthy weight.  Managing diabetes, if necessary.  Reducing stress. SEEK MEDICAL CARE IF:  Your chest pain does not go away after treatment.  You have a rash with blisters on your chest.  You have a fever. SEEK IMMEDIATE MEDICAL CARE IF:   Your chest pain is worse.  You have an increasing cough, or you cough up blood.  You have severe abdominal pain.  You have severe weakness.  You faint.  You have chills.  You have sudden, unexplained chest discomfort.  You have sudden, unexplained discomfort in your arms, back, neck, or jaw.  You have shortness of breath at any time.  You suddenly start to sweat, or your skin gets clammy.  You feel nauseous or you vomit.  You suddenly feel light-headed or dizzy.  Your heart begins to beat quickly, or it feels like it is skipping beats. These symptoms may represent a serious problem that is an emergency. Do not wait to see if the symptoms will go away. Get medical help right away. Call your local emergency services (911 in the [place] Do not drive yourself to the hospital.   This  information is not intended to replace advice given to you by your health care provider. Make sure you discuss any questions you have with your health care provider.   Document Released: 03/17/2005 Document Revised: 06/28/2014 Document Reviewed: 01/11/2014 Elsevier Interactive Patient Education 2016 [employer]  Postural Orthostatic Tachycardia Syndrome Postural orthostatic tachycardia syndrome (POTS) is an increased heart rate when going from a lying (supine) position to a standing position. The heart rate may increase more than 30 beats per minute (BPM) above its resting rate when going from a lying to a standing position. POTS occurs more frequently in women than in men.  SYMPTOMS  POTS symptoms may be increased in the morning. Symptoms of POTS include:  Fainting or near fainting.  Inability to think clearly.  Extreme or chronic fatigue.  Exercise intolerance.  Chest pain.  Having the lower legs develop a reddish-blue color due to decreased blood flow (acrocyanosis). CAUSES POTS can be caused by different conditions. Sometimes, it has no known cause (idiopathic). Some causes of POTS include:  Viral illness.  Pregnancy.  Autoimmune diseases.  Medications.  Major surgery.  Trauma such as a car accident or major injury.  Medical conditions such as anemia,  dehydration, and hyperthyroidism. DIAGNOSIS  POTS is diagnosed by:  Taking a complete history and physical exam.  Measuring the heart rate while lying and then upon standing.  Measuring blood pressure when going from a lying to a standing position. POTS is usually not associated with low blood pressure (orthostatic hypotension) when going from a lying to standing position. While standing, blood pressure should be taken 2, 5, and 10 minutes after getting up. TREATMENT  Treatment of POTS depends upon the severity of the symptoms. Treatment includes:  Drinking plenty of fluids to avoid getting dehydrated.  Avoiding  very hot environments to not get overheated.  Increasing your dietary salt intake as instructed by your caregiver.  Taking different types of medications as prescribed for POTS.  Avoiding some classes of medications such as vasodilators and diuretics. SEEK IMMEDIATE MEDICAL CARE IF  You have severe chest pain that does not go away. Call your local emergency service immediately.  You feel your heart racing or beating rapidly.  You feel like passing out.  You have very confused thinking. MAKE SURE YOU  Understand these instructions.  Will watch your condition.  Will get help right away if you are not doing well or get worse.   This information is not intended to replace advice given to you by your health care provider. Make sure you discuss any questions you have with your health care provider.   Document Released: 05/28/2002 Document Revised: 06/28/2014 Document Reviewed: 08/05/2010 Elsevier Interactive Patient Education [employer].

## 2015-11-05 NOTE — ED Notes (Signed)
She c/o recurrent chest discomfort and dizziness, for which she was seen here recently "and all the tests they did were 'normal'".  Her skin is normal, warm and dry and she is breathing normally. EKG performed at triage.

## 2015-11-07 ENCOUNTER — Ambulatory Visit (INDEPENDENT_AMBULATORY_CARE_PROVIDER_SITE_OTHER): Payer: PPO | Admitting: Family Medicine

## 2015-11-07 ENCOUNTER — Encounter: Payer: Self-pay | Admitting: Family Medicine

## 2015-11-07 VITALS — BP 104/68 | HR 124 | Temp 98.8°F | Ht 69.0 in | Wt 170.8 lb

## 2015-11-07 DIAGNOSIS — H9201 Otalgia, right ear: Secondary | ICD-10-CM | POA: Diagnosis not present

## 2015-11-07 NOTE — Patient Instructions (Signed)
I think you have ETD (eustacian tube dysfunction)- causing a pressure change in your ear on and off  Continue allegra Try flonase or nasacort nasal spray over the counter daily for at least 2 weeks  Avoid allergens when you can  Update if not starting to improve in a week or if worsening  = worse pain or fever especially

## 2015-11-07 NOTE — Progress Notes (Signed)
Pre visit review using our clinic review tool, if applicable. No additional management support is needed unless otherwise documented below in the visit note. 

## 2015-11-07 NOTE — Assessment & Plan Note (Signed)
Intermittent and fleeting- suspect due to ETD Adv starting steroid ns otc (flonase or nasacort) daily for at least 2 wk  Continue allegra  Update if not starting to improve in a week or if worsening  -esp if inc pain or fever/other symptoms

## 2015-11-07 NOTE — Progress Notes (Signed)
Subjective:    Patient ID: Melissa [name], female    DOB: [birth year], 45 y.o.   MRN: :[id number]  HPI Here with R ear pain   Going on since wed - sharp pains that come and go  Hearing seems ok  pnd  Some allergies -takes allegra (not severe) No sinus pain   No fever  A little cough from drainage this am   Patient Active Problem List   Diagnosis Date Noted  . Bilateral cataracts 08/14/2015  . Acne 10/21/2014  . Liver lesion 09/09/2014  . Vitamin D deficiency 09/09/2014  . Hot flashes 06/10/2014  . Chronic pain syndrome 05/20/2014  . Vitamin B12 deficiency 03/12/2014  . Contraception management 09/20/2013  . IBS (irritable bowel syndrome) 09/20/2013  . Unspecified vitamin D deficiency 04/03/2013  . HLD (hyperlipidemia) 04/03/2013  . Osteopenia 04/03/2013  . POTS (postural orthostatic tachycardia syndrome) 02/26/2013  . Intestinal infection 01/27/2013  . Migraine 11/29/2012  . Allergic rhinitis 10/05/2012  . Mixed bipolar I disorder in remission ([place]) 10/05/2012  . Combined fat and carbohydrate induced hyperlipemia 10/05/2012  . Fibromyalgia   . Bipolar disorder ([place])   . CFIDS (chronic fatigue and immune dysfunction syndrome) 09/10/2011  . Clinical depression 09/10/2011  . Acid reflux 09/10/2011  . Non-restorative sleep 09/10/2011  . Disturbance in sleep behavior 09/10/2011   Past Medical History  Diagnosis Date  . Bipolar disorder ([place])   . Fibromyalgia     on disability  . Migraine   . Panic attack   . Muscle spasm   . Uterine perforation     hx of  . Syncope     recurrent  . POTS (postural orthostatic tachycardia syndrome)   . Pain of right side of body     chronic  . Arthritis    Past Surgical History  Procedure Laterality Date  . Dilation and curettage of uterus    . Cataract extraction  dec 2014  . Lasik  dec 2014   Social History  Substance Use Topics  . Smoking status: Never Smoker   . Smokeless tobacco: Never Used  . Alcohol Use: No   Comment: 1 holiday drink yearly   Family History  Problem Relation Age of Onset  . Diabetes Mother   . Hypertension Mother   . Cancer Father     brain tumor  . Cancer Maternal Grandmother   . Cancer Maternal Grandfather   . Cancer Paternal Grandmother   . Cancer Paternal Grandfather   . Migraines Neg Hx    Allergies  Allergen Reactions  . Abilify [Aripiprazole] Other (See Comments)    Weight gain, did not help,   . Depakote [Divalproex Sodium] Other (See Comments)    Hair loss  . Erythromycin Base Swelling  . Lamotrigine Other (See Comments)    Weight gain, angioedema, hives  . Seroquel [Quetiapine Fumarate] Other (See Comments)    irritable  . Tetanus Toxoids     Other reaction(s): OTHER  . Tramadol   . Ziprasidone Hcl Other (See Comments)    Heat/cold intolerance.   . Azithromycin Rash  . Lithium Rash  . Penicillins Rash    Has patient had a PCN reaction causing immediate rash, facial/tongue/throat swelling, SOB or lightheadedness with hypotension: Yes- swelling, rash  Has patient had a PCN reaction causing severe rash involving mucus membranes or skin necrosis: Yes Has patient had a PCN reaction that required hospitalization No Has patient had a PCN reaction occurring within the last 10 years: No  If all of the above answers are "NO", then may proceed with Cephalosporin use.   . Pregabalin Palpitations    Manic symptoms, no sleep x's 3 days, no appetite.  . Sulfa Antibiotics Rash   Current Outpatient Prescriptions on File Prior to Visit  Medication Sig Dispense Refill  . Cholecalciferol (VITAMIN D3) 1000 UNITS CAPS Take 2,000 Units by mouth daily.     . clonazePAM (KLONOPIN) 1 MG tablet Take 1 tablet (1 mg total) by mouth 2 (two) times daily as needed for anxiety. 60 tablet 0  . diclofenac sodium (VOLTAREN) 1 % GEL Apply to affected area three to four times daily. (Patient taking differently: Apply 2 g topically 4 (four) times daily as needed (pain). Apply to affected  area three to four times daily.) 100 g 0  . divalproex (DEPAKOTE) 250 MG DR tablet Take 250 mg by mouth 2 (two) times daily.    . fexofenadine (ALLEGRA) 180 MG tablet Take 180 mg by mouth daily.    Marland Kitchen gabapentin (NEURONTIN) 600 MG tablet TAKE ONE (1) TABLET THREE (3) TIMES EACH DAY 90 tablet 5  . HYDROcodone-acetaminophen (NORCO) 5-325 MG tablet Take 1 tablet by mouth every 6 (six) hours as needed for moderate pain. 60 tablet 0  . medroxyPROGESTERone (DEPO-PROVERA) 150 MG/ML injection Inject 150 mg into the muscle every 3 (three) months.    . ondansetron (ZOFRAN-ODT) 4 MG disintegrating tablet Take 4 mg by mouth every 8 (eight) hours as needed for nausea or vomiting.     . ranitidine (ZANTAC) 150 MG tablet Take 150 mg by mouth daily.    . risperiDONE (RISPERDAL) 0.5 MG tablet Take 0.5 mg by mouth at bedtime.    Marland Kitchen tiZANidine (ZANAFLEX) 4 MG tablet TAKE 1 TABLET BY MOUTH EVERY 8 HOURS AS NEEDED FOR MUSCLE SPASMS 30 tablet 5  . vitamin B-12 (CYANOCOBALAMIN) 1000 MCG tablet Take 1,000 mcg by mouth daily.     No current facility-administered medications on file prior to visit.    Review of Systems Review of Systems  Constitutional: Negative for fever, appetite change, fatigue and unexpected weight change.  Eyes: Negative for pain and visual disturbance.  ENT pos for R ear fleeting pain / pnd/ allergy related sneezing/ neg for sinus pain  Respiratory: Negative for cough and shortness of breath.   Cardiovascular: Negative for cp or palpitations    Gastrointestinal: Negative for nausea, diarrhea and constipation.  Genitourinary: Negative for urgency and frequency.  Skin: Negative for pallor or rash   Neurological: Negative for weakness, light-headedness, numbness and headaches. neg for new dizziness Hematological: Negative for adenopathy. Does not bruise/bleed easily.  Psychiatric/Behavioral: Negative for dysphoric mood. The patient is not nervous/anxious.         Objective:   Physical Exam    Constitutional: She appears well-developed and well-nourished. No distress.  Well appearing   HENT:  Head: Normocephalic and atraumatic.  Right Ear: External ear normal.  Left Ear: External ear normal.  Mouth/Throat: Oropharynx is clear and moist.  Nares are boggy/ slt injected Mild clear pnd TMs are clear (R is a bit more dull than the L)  No sinus tenderness  Eyes: Conjunctivae and EOM are normal. Pupils are equal, round, and reactive to light. Right eye exhibits no discharge. Left eye exhibits no discharge.  Neck: Normal range of motion. Neck supple.  Cardiovascular: Normal rate and regular rhythm.   Pulmonary/Chest: Effort normal and breath sounds normal. No respiratory distress. She has no wheezes. She has no rales.  Lymphadenopathy:    She has no cervical adenopathy.  Neurological: She is alert. No cranial nerve deficit.  Skin: Skin is warm and dry. No rash noted. No pallor.  Psychiatric: She has a normal mood and affect.          Assessment & Plan:   Problem List Items Addressed This Visit      Other   Otalgia of right ear - Primary    Intermittent and fleeting- suspect due to ETD Adv starting steroid ns otc (flonase or nasacort) daily for at least 2 wk  Continue allegra  Update if not starting to improve in a week or if worsening  -esp if inc pain or fever/other symptoms

## 2015-11-13 ENCOUNTER — Ambulatory Visit (INDEPENDENT_AMBULATORY_CARE_PROVIDER_SITE_OTHER): Payer: PPO | Admitting: Family Medicine

## 2015-11-13 ENCOUNTER — Encounter: Payer: Self-pay | Admitting: Family Medicine

## 2015-11-13 VITALS — BP 116/68 | HR 92 | Temp 98.2°F | Wt 172.2 lb

## 2015-11-13 DIAGNOSIS — G894 Chronic pain syndrome: Secondary | ICD-10-CM | POA: Diagnosis not present

## 2015-11-13 DIAGNOSIS — Z309 Encounter for contraceptive management, unspecified: Secondary | ICD-10-CM

## 2015-11-13 DIAGNOSIS — R000 Tachycardia, unspecified: Secondary | ICD-10-CM | POA: Diagnosis not present

## 2015-11-13 DIAGNOSIS — M797 Fibromyalgia: Secondary | ICD-10-CM

## 2015-11-13 DIAGNOSIS — G90A Postural orthostatic tachycardia syndrome (POTS): Secondary | ICD-10-CM

## 2015-11-13 DIAGNOSIS — I951 Orthostatic hypotension: Secondary | ICD-10-CM

## 2015-11-13 MED ORDER — MEDROXYPROGESTERONE ACETATE 150 MG/ML IM SUSP
150.0000 mg | Freq: Once | INTRAMUSCULAR | Status: AC
  Administered 2015-11-13: 150 mg via INTRAMUSCULAR

## 2015-11-13 NOTE — Assessment & Plan Note (Signed)
Reasonably controlled. No changes made today.

## 2015-11-13 NOTE — Progress Notes (Signed)
Subjective:   Patient ID: Melissa [name], female    DOB: 07-03-1970, 45 y.o.   MRN: [place]:[id number]  Melissa [name] is a pleasant 45 y.o. year old female who presents to clinic today with Follow-up and Contraception  on 11/13/2015  HPI:  Fibromyalgia- doing better. She has been on disability for fibromyalgia for years. Does still need occassional hydrocodone which I prescribe.  Migraines- has only had one since Dr. Jaynee Eagles placed her on [place] on 02/25/15- note reviewed. She is very pleased.  Due for Depo injection today.  Had a bad week last week. Was seen in ER for syncope and Chest pain. Notes reviewed. Likely secondary to POTs.  Feeling better now that she is staying better hydrated.    Current Outpatient Prescriptions on File Prior to Visit  Medication Sig Dispense Refill  . Cholecalciferol (VITAMIN D3) 1000 UNITS CAPS Take 2,000 Units by mouth daily.     . clonazePAM (KLONOPIN) 1 MG tablet Take 1 tablet (1 mg total) by mouth 2 (two) times daily as needed for anxiety. 60 tablet 0  . diclofenac sodium (VOLTAREN) 1 % GEL Apply to affected area three to four times daily. (Patient taking differently: Apply 2 g topically 4 (four) times daily as needed (pain). Apply to affected area three to four times daily.) 100 g 0  . divalproex (DEPAKOTE) 250 MG DR tablet Take 500 mg by mouth 2 (two) times daily.     . fexofenadine (ALLEGRA) 180 MG tablet Take 180 mg by mouth daily.    Marland Kitchen gabapentin (NEURONTIN) 600 MG tablet TAKE ONE (1) TABLET THREE (3) TIMES EACH DAY 90 tablet 5  . HYDROcodone-acetaminophen (NORCO) 5-325 MG tablet Take 1 tablet by mouth every 6 (six) hours as needed for moderate pain. 60 tablet 0  . medroxyPROGESTERone (DEPO-PROVERA) 150 MG/ML injection Inject 150 mg into the muscle every 3 (three) months.    . ondansetron (ZOFRAN-ODT) 4 MG disintegrating tablet Take 4 mg by mouth every 8 (eight) hours as needed for nausea or vomiting.     . ranitidine (ZANTAC) 150 MG tablet Take 150 mg by  mouth daily.    . risperiDONE (RISPERDAL) 0.5 MG tablet Take 0.5 mg by mouth at bedtime.    Marland Kitchen tiZANidine (ZANAFLEX) 4 MG tablet TAKE 1 TABLET BY MOUTH EVERY 8 HOURS AS NEEDED FOR MUSCLE SPASMS 30 tablet 5  . vitamin B-12 (CYANOCOBALAMIN) 1000 MCG tablet Take 1,000 mcg by mouth daily.     No current facility-administered medications on file prior to visit.    Allergies  Allergen Reactions  . Abilify [Aripiprazole] Other (See Comments)    Weight gain, did not help,   . Depakote [Divalproex Sodium] Other (See Comments)    Hair loss  . Erythromycin Base Swelling  . Lamotrigine Other (See Comments)    Weight gain, angioedema, hives  . Seroquel [Quetiapine Fumarate] Other (See Comments)    irritable  . Tetanus Toxoids     Other reaction(s): OTHER  . Tramadol   . Ziprasidone Hcl Other (See Comments)    Heat/cold intolerance.   . Azithromycin Rash  . Lithium Rash  . Penicillins Rash    Has patient had a PCN reaction causing immediate rash, facial/tongue/throat swelling, SOB or lightheadedness with hypotension: Yes- swelling, rash  Has patient had a PCN reaction causing severe rash involving mucus membranes or skin necrosis: Yes Has patient had a PCN reaction that required hospitalization No Has patient had a PCN reaction occurring within the last 10  years: No If all of the above answers are "NO", then may proceed with Cephalosporin use.   . Pregabalin Palpitations    Manic symptoms, no sleep x's 3 days, no appetite.  . Sulfa Antibiotics Rash    Past Medical History  Diagnosis Date  . Bipolar disorder ([place])   . Fibromyalgia     on disability  . Migraine   . Panic attack   . Muscle spasm   . Uterine perforation     hx of  . Syncope     recurrent  . POTS (postural orthostatic tachycardia syndrome)   . Pain of right side of body     chronic  . Arthritis     Past Surgical History  Procedure Laterality Date  . Dilation and curettage of uterus    . Cataract extraction  dec  2014  . Lasik  dec 2014    Family History  Problem Relation Age of Onset  . Diabetes Mother   . Hypertension Mother   . Cancer Father     brain tumor  . Cancer Maternal Grandmother   . Cancer Maternal Grandfather   . Cancer Paternal Grandmother   . Cancer Paternal Grandfather   . Migraines Neg Hx     Social History   Social History  . Marital Status: Married    Spouse Name: Melissa [name]  . Number of Children: 0  . Years of Education: HS/college   Occupational History  .  Other    disabled   Social History Main Topics  . Smoking status: Never Smoker   . Smokeless tobacco: Never Used  . Alcohol Use: No     Comment: 1 holiday drink yearly  . Drug Use: No  . Sexual Activity: Yes    Birth Control/ Protection: Injection   Other Topics Concern  . Not on file   Social History Narrative   Patient is married [place]) and lives at home with her husband.   Married 20+ years   From [place]   On disability for fibromyalgia   Caffeine Use: none       The PMH, PSH, Social History, Family History, Medications, and allergies have been reviewed in [facility], and have been updated if relevant.   Review of Systems  Constitutional: Negative.   Respiratory: Negative.   Cardiovascular: Negative.   Gastrointestinal: Negative.   Endocrine: Negative.   Genitourinary: Negative.   Musculoskeletal: Positive for myalgias.  Skin: Negative.   Allergic/Immunologic: Negative.   Neurological: Negative for headaches.  Hematological: Negative.   Psychiatric/Behavioral: Negative.   All other systems reviewed and are negative.      Objective:    BP 116/68 mmHg  Pulse 92  Temp(Src) 98.2 F (36.8 C) (Oral)  Wt 172 lb 4 oz (78.132 kg)  SpO2 97%   Physical Exam  Constitutional: She is oriented to person, place, and time. She appears well-developed and well-nourished. No distress.  HENT:  Head: Normocephalic and atraumatic.  Eyes: Conjunctivae are normal.  Neck: Normal range of motion.    Cardiovascular: Normal rate, regular rhythm and normal heart sounds.   Pulmonary/Chest: Effort normal and breath sounds normal. No respiratory distress. She has no wheezes. She has no rales. She exhibits no tenderness.  Musculoskeletal: Normal range of motion. She exhibits no edema.  Neurological: She is alert and oriented to person, place, and time. No cranial nerve deficit.  Skin: Skin is warm and dry.  Psychiatric: She has a normal mood and affect. Her behavior is normal. Judgment  and thought content normal.  Nursing note and vitals reviewed.         Assessment & Plan:   Encounter for contraceptive management, unspecified encounter - Plan: medroxyPROGESTERone (DEPO-PROVERA) injection 150 mg  Fibromyalgia  Chronic pain syndrome  POTS (postural orthostatic tachycardia syndrome) No Follow-up on file.

## 2015-11-13 NOTE — Assessment & Plan Note (Signed)
Recently symptomatic but currently feeling better.

## 2015-11-13 NOTE — Progress Notes (Signed)
Pre visit review using our clinic review tool, if applicable. No additional management support is needed unless otherwise documented below in the visit note. 

## 2015-11-13 NOTE — Assessment & Plan Note (Signed)
IM depo. She will be seeing her GYN later this month- re- ? Continue depo. The patient indicates understanding of these issues and agrees with the plan.

## 2015-11-20 DIAGNOSIS — Z79899 Other long term (current) drug therapy: Secondary | ICD-10-CM | POA: Diagnosis not present

## 2015-11-25 ENCOUNTER — Other Ambulatory Visit: Payer: Self-pay | Admitting: Family Medicine

## 2015-11-25 DIAGNOSIS — F3132 Bipolar disorder, current episode depressed, moderate: Secondary | ICD-10-CM | POA: Diagnosis not present

## 2015-11-25 DIAGNOSIS — Z79899 Other long term (current) drug therapy: Secondary | ICD-10-CM | POA: Diagnosis not present

## 2015-11-25 NOTE — Telephone Encounter (Signed)
Rx called in to requested pharmacy 

## 2015-11-25 NOTE — Telephone Encounter (Signed)
Last f/u 10/2015 

## 2015-12-26 ENCOUNTER — Other Ambulatory Visit: Payer: Self-pay | Admitting: *Deleted

## 2015-12-26 MED ORDER — CLONAZEPAM 1 MG PO TABS: 1.0000 mg | ORAL_TABLET | Freq: Two times a day (BID) | ORAL | Status: DC | PRN

## 2015-12-26 NOTE — Telephone Encounter (Signed)
Last f/u 10/2015 

## 2015-12-29 DIAGNOSIS — Z79899 Other long term (current) drug therapy: Secondary | ICD-10-CM | POA: Diagnosis not present

## 2015-12-29 NOTE — Telephone Encounter (Signed)
Rx called in to requested pharmacy 

## 2015-12-31 NOTE — ED Provider Notes (Signed)
Medical screening examination/treatment/procedure(s) were performed by non-physician practitioner and as supervising physician I was immediately available for consultation/collaboration.   EKG Interpretation   Date/Time:  Thursday October 16 2015 19:55:20 EDT Ventricular Rate:  74 PR Interval:  156 QRS Duration: 85 QT Interval:  372 QTC Calculation: 413 R Axis:   111 Text Interpretation:  Right and left arm electrode reversal,  interpretation assumes no reversal Sinus rhythm Sinus pause Right axis  deviation Low voltage, precordial leads Abnormal T, consider ischemia,  lateral leads Confirmed by [facility] MD, Gabino Hagin ([id number]) on 10/16/2015 8:00:02  PM Also confirmed by [facility] MD, Annaelle Kasel ([id number]), editor [place]  [facility],  [place] ([id number])  on 10/17/2015 7:12:33 AM       Isla Pence, MD 12/31/15 1400

## 2016-01-26 ENCOUNTER — Other Ambulatory Visit: Payer: Self-pay | Admitting: Family Medicine

## 2016-01-27 ENCOUNTER — Other Ambulatory Visit: Payer: Self-pay | Admitting: *Deleted

## 2016-01-27 MED ORDER — CLONAZEPAM 1 MG PO TABS: 1.0000 mg | ORAL_TABLET | Freq: Two times a day (BID) | ORAL | 0 refills | Status: DC | PRN

## 2016-01-27 NOTE — Telephone Encounter (Signed)
Last f/u 10/2015 

## 2016-01-27 NOTE — Telephone Encounter (Signed)
Rx called in to requested pharmacy 

## 2016-02-02 ENCOUNTER — Ambulatory Visit (INDEPENDENT_AMBULATORY_CARE_PROVIDER_SITE_OTHER): Payer: PPO | Admitting: Family Medicine

## 2016-02-02 ENCOUNTER — Encounter: Payer: Self-pay | Admitting: Family Medicine

## 2016-02-02 VITALS — BP 122/72 | HR 84 | Temp 98.0°F | Wt 168.0 lb

## 2016-02-02 DIAGNOSIS — Z3042 Encounter for surveillance of injectable contraceptive: Secondary | ICD-10-CM | POA: Diagnosis not present

## 2016-02-02 DIAGNOSIS — M797 Fibromyalgia: Secondary | ICD-10-CM

## 2016-02-02 DIAGNOSIS — I951 Orthostatic hypotension: Secondary | ICD-10-CM

## 2016-02-02 DIAGNOSIS — R112 Nausea with vomiting, unspecified: Secondary | ICD-10-CM | POA: Diagnosis not present

## 2016-02-02 DIAGNOSIS — R000 Tachycardia, unspecified: Secondary | ICD-10-CM | POA: Diagnosis not present

## 2016-02-02 DIAGNOSIS — G43801 Other migraine, not intractable, with status migrainosus: Secondary | ICD-10-CM

## 2016-02-02 DIAGNOSIS — G90A Postural orthostatic tachycardia syndrome (POTS): Secondary | ICD-10-CM

## 2016-02-02 LAB — H. PYLORI ANTIBODY, IGG: H PYLORI IGG: NEGATIVE

## 2016-02-02 MED ORDER — HYDROCODONE-ACETAMINOPHEN 5-325 MG PO TABS: 1.0000 | ORAL_TABLET | Freq: Four times a day (QID) | ORAL | 0 refills | Status: DC | PRN

## 2016-02-02 MED ORDER — MEDROXYPROGESTERONE ACETATE 150 MG/ML IM SUSP
150.0000 mg | Freq: Once | INTRAMUSCULAR | Status: AC
  Administered 2016-02-02: 150 mg via INTRAMUSCULAR

## 2016-02-02 MED ORDER — DICLOFENAC SODIUM 1 % TD GEL: TRANSDERMAL | 0 refills | Status: DC

## 2016-02-02 NOTE — Progress Notes (Signed)
Pre visit review using our clinic review tool, if applicable. No additional management support is needed unless otherwise documented below in the visit note. 

## 2016-02-02 NOTE — Patient Instructions (Signed)
Great to see you. I will call you with your lab results.   

## 2016-02-02 NOTE — Progress Notes (Signed)
Subjective:   Patient ID: Melissa [name], female    DOB: 1970/08/02, 45 y.o.   MRN: [id number]  Melissa [name] is a pleasant 46 y.o. year old female who presents to clinic today with Follow-up and Contraception  on 02/02/2016  HPI:  Fibromyalgia- doing better. She has been on disability for fibromyalgia for years. Does still need occassional hydrocodone which I prescribe.  Migraines- has only had one since Dr. Jaynee Eagles placed her on [place] on 02/25/15- note reviewed. She is very pleased.  Due for Depo injection today.  Has been having more nausea, sometimes vomiting for the past couple of weeks.  No abdominal pain.  Occasional diarrhea. Zofran has been effective.    Current Outpatient Prescriptions on File Prior to Visit  Medication Sig Dispense Refill  . Cholecalciferol (VITAMIN D3) 1000 UNITS CAPS Take 2,000 Units by mouth daily.     . clonazePAM (KLONOPIN) 1 MG tablet Take 1 tablet (1 mg total) by mouth 2 (two) times daily as needed. for anxiety 60 tablet 0  . diclofenac sodium (VOLTAREN) 1 % GEL Apply to affected area three to four times daily. (Patient taking differently: Apply 2 g topically 4 (four) times daily as needed (pain). Apply to affected area three to four times daily.) 100 g 0  . fexofenadine (ALLEGRA) 180 MG tablet Take 180 mg by mouth daily.    Marland Kitchen gabapentin (NEURONTIN) 600 MG tablet TAKE ONE (1) TABLET BY MOUTH THREE TIMES A DAY 90 tablet 5  . HYDROcodone-acetaminophen (NORCO) 5-325 MG tablet Take 1 tablet by mouth every 6 (six) hours as needed for moderate pain. 60 tablet 0  . medroxyPROGESTERone (DEPO-PROVERA) 150 MG/ML injection Inject 150 mg into the muscle every 3 (three) months.    . ondansetron (ZOFRAN-ODT) 4 MG disintegrating tablet Take 4 mg by mouth every 8 (eight) hours as needed for nausea or vomiting.     . ranitidine (ZANTAC) 150 MG tablet Take 150 mg by mouth daily.    Marland Kitchen tiZANidine (ZANAFLEX) 4 MG tablet TAKE 1 TABLET BY MOUTH EVERY 8 HOURS AS NEEDED FOR  MUSCLE SPASMS 30 tablet 5  . vitamin B-12 (CYANOCOBALAMIN) 1000 MCG tablet Take 1,000 mcg by mouth daily.     No current facility-administered medications on file prior to visit.     Allergies  Allergen Reactions  . Abilify [Aripiprazole] Other (See Comments)    Weight gain, did not help,   . Depakote [Divalproex Sodium] Other (See Comments)    Hair loss  . Erythromycin Base Swelling  . Lamotrigine Other (See Comments)    Weight gain, angioedema, hives  . Seroquel [Quetiapine Fumarate] Other (See Comments)    irritable  . Tetanus Toxoids     Other reaction(s): OTHER  . Tramadol   . Ziprasidone Hcl Other (See Comments)    Heat/cold intolerance.   . Azithromycin Rash  . Lithium Rash  . Penicillins Rash    Has patient had a PCN reaction causing immediate rash, facial/tongue/throat swelling, SOB or lightheadedness with hypotension: Yes- swelling, rash  Has patient had a PCN reaction causing severe rash involving mucus membranes or skin necrosis: Yes Has patient had a PCN reaction that required hospitalization No Has patient had a PCN reaction occurring within the last 10 years: No If all of the above answers are "NO", then may proceed with Cephalosporin use.   . Pregabalin Palpitations    Manic symptoms, no sleep x's 3 days, no appetite.  . Sulfa Antibiotics Rash  Past Medical History:  Diagnosis Date  . Arthritis   . Bipolar disorder ([place])   . Fibromyalgia    on disability  . Migraine   . Muscle spasm   . Pain of right side of body    chronic  . Panic attack   . POTS (postural orthostatic tachycardia syndrome)   . Syncope    recurrent  . Uterine perforation    hx of    Past Surgical History:  Procedure Laterality Date  . CATARACT EXTRACTION  dec 2014  . DILATION AND CURETTAGE OF UTERUS    . LASIK  dec 2014    Family History  Problem Relation Age of Onset  . Diabetes Mother   . Hypertension Mother   . Cancer Father     brain tumor  . Cancer Maternal  Grandmother   . Cancer Maternal Grandfather   . Cancer Paternal Grandmother   . Cancer Paternal Grandfather   . Migraines Neg Hx     Social History   Social History  . Marital status: Married    Spouse name: Izell Stuart  . Number of children: 0  . Years of education: HS/college   Occupational History  .  Other    disabled   Social History Main Topics  . Smoking status: Never Smoker  . Smokeless tobacco: Never Used  . Alcohol use No     Comment: 1 holiday drink yearly  . Drug use: No  . Sexual activity: Yes    Birth control/ protection: Injection   Other Topics Concern  . Not on file   Social History Narrative   Patient is married Izell ) and lives at home with her husband.   Married 20+ years   From [place]   On disability for fibromyalgia   Caffeine Use: none       The PMH, PSH, Social History, Family History, Medications, and allergies have been reviewed in [facility], and have been updated if relevant.   Review of Systems  Constitutional: Negative.   Respiratory: Negative.   Cardiovascular: Negative.   Gastrointestinal: Positive for diarrhea, nausea and vomiting.  Endocrine: Negative.   Genitourinary: Negative.   Musculoskeletal: Positive for myalgias.  Skin: Negative.   Allergic/Immunologic: Negative.   Neurological: Negative for headaches.  Hematological: Negative.   Psychiatric/Behavioral: Negative.   All other systems reviewed and are negative.      Objective:    BP 122/72   Pulse 84   Temp 98 F (36.7 C) (Oral)   Wt 168 lb (76.2 kg)   SpO2 97%   BMI 24.81 kg/m    Physical Exam  Constitutional: She is oriented to person, place, and time. She appears well-developed and well-nourished. No distress.  HENT:  Head: Normocephalic and atraumatic.  Eyes: Conjunctivae are normal.  Neck: Normal range of motion.  Cardiovascular: Normal rate, regular rhythm and normal heart sounds.   Pulmonary/Chest: Effort normal and breath sounds normal. No respiratory  distress. She has no wheezes. She has no rales. She exhibits no tenderness.  Abdominal: Soft. Bowel sounds are normal. She exhibits no distension and no mass. There is no tenderness. There is no rebound and no guarding.  Musculoskeletal: Normal range of motion. She exhibits no edema.  Neurological: She is alert and oriented to person, place, and time. No cranial nerve deficit.  Skin: Skin is warm and dry.  Psychiatric: She has a normal mood and affect. Her behavior is normal. Judgment and thought content normal.  Nursing note and vitals reviewed.  Assessment & Plan:   Encounter for surveillance of injectable contraceptive  Other migraine with status migrainosus, not intractable  POTS (postural orthostatic tachycardia syndrome)  Fibromyalgia No Follow-up on file.

## 2016-02-02 NOTE — Assessment & Plan Note (Signed)
IM depo given to pt today.

## 2016-02-02 NOTE — Addendum Note (Signed)
Addended by: Modena Nunnery on: 02/02/2016 11:08 AM   Modules accepted: Orders

## 2016-02-02 NOTE — Assessment & Plan Note (Signed)
Exam benign and reassuring. ? H pylori- agrees to lab today. Has zofran prescribed by neurology that she can take as needed.

## 2016-02-02 NOTE — Assessment & Plan Note (Signed)
Stable. No changesa mde today.

## 2016-02-02 NOTE — Assessment & Plan Note (Signed)
Stable.  No changes made. 

## 2016-02-03 ENCOUNTER — Encounter: Payer: Self-pay | Admitting: *Deleted

## 2016-02-19 DIAGNOSIS — Z79899 Other long term (current) drug therapy: Secondary | ICD-10-CM | POA: Diagnosis not present

## 2016-02-19 DIAGNOSIS — F3132 Bipolar disorder, current episode depressed, moderate: Secondary | ICD-10-CM | POA: Diagnosis not present

## 2016-02-25 ENCOUNTER — Other Ambulatory Visit: Payer: Self-pay | Admitting: *Deleted

## 2016-02-25 MED ORDER — CLONAZEPAM 1 MG PO TABS: 1.0000 mg | ORAL_TABLET | Freq: Two times a day (BID) | ORAL | 0 refills | Status: DC | PRN

## 2016-02-25 NOTE — Telephone Encounter (Signed)
Rx called in to requested pharmacy 

## 2016-02-25 NOTE — Telephone Encounter (Signed)
Last f/u 01/2016 

## 2016-03-02 ENCOUNTER — Encounter: Payer: Self-pay | Admitting: Family Medicine

## 2016-03-02 ENCOUNTER — Ambulatory Visit (INDEPENDENT_AMBULATORY_CARE_PROVIDER_SITE_OTHER): Payer: PPO | Admitting: Family Medicine

## 2016-03-02 DIAGNOSIS — L659 Nonscarring hair loss, unspecified: Secondary | ICD-10-CM | POA: Insufficient documentation

## 2016-03-02 LAB — COMPREHENSIVE METABOLIC PANEL
ALBUMIN: 4.3 g/dL (ref 3.5–5.2)
ALK PHOS: 52 U/L (ref 39–117)
ALT: 13 U/L (ref 0–35)
AST: 15 U/L (ref 0–37)
BILIRUBIN TOTAL: 0.4 mg/dL (ref 0.2–1.2)
BUN: 9 mg/dL (ref 6–23)
CALCIUM: 8.9 mg/dL (ref 8.4–10.5)
CO2: 26 mEq/L (ref 19–32)
CREATININE: 1.03 mg/dL (ref 0.40–1.20)
Chloride: 109 mEq/L (ref 96–112)
GFR: 61.18 mL/min (ref >60.00)
Glucose, Bld: 83 mg/dL (ref 70–99)
Potassium: 3.9 mEq/L (ref 3.5–5.1)
Sodium: 141 mEq/L (ref 135–145)
TOTAL PROTEIN: 6.7 g/dL (ref 6.0–8.3)

## 2016-03-02 LAB — CBC WITH DIFFERENTIAL/PLATELET
BASOS ABS: 0 10*3/uL (ref 0.0–0.1)
BASOS PCT: 0.8 % (ref 0.0–3.0)
Eosinophils Absolute: 0.1 10*3/uL (ref 0.0–0.7)
Eosinophils Relative: 1.7 % (ref 0.0–5.0)
HEMATOCRIT: 40.9 % (ref 36.0–46.0)
HEMOGLOBIN: 14 g/dL (ref 12.0–15.0)
LYMPHS PCT: 36.2 % (ref 12.0–46.0)
Lymphs Abs: 2.2 10*3/uL (ref 0.7–4.0)
MCHC: 34.2 g/dL (ref 30.0–36.0)
MCV: 85.3 fl (ref 78.0–100.0)
Monocytes Absolute: 0.4 10*3/uL (ref 0.1–1.0)
Monocytes Relative: 7.3 % (ref 3.0–12.0)
Neutro Abs: 3.3 10*3/uL (ref 1.4–7.7)
Neutrophils Relative %: 54 % (ref 43.0–77.0)
Platelets: 223 10*3/uL (ref 150.0–400.0)
RBC: 4.8 Mil/uL (ref 3.87–5.11)
RDW: 13.6 % (ref 11.5–15.5)
WBC: 6.1 10*3/uL (ref 4.0–10.5)

## 2016-03-02 LAB — VITAMIN D 25 HYDROXY (VIT D DEFICIENCY, FRACTURES): VITD: 30.95 ng/mL (ref 30.00–100.00)

## 2016-03-02 LAB — VITAMIN B12: Vitamin B-12: 1066 pg/mL — ABNORMAL HIGH (ref 211–911)

## 2016-03-02 NOTE — Assessment & Plan Note (Signed)
Likely multifactorial. ?depakote but cannot rule out other causes -will check labs today. The patient indicates understanding of these issues and agrees with the plan. Orders Placed This Encounter  Procedures  . Vitamin B12  . Vitamin D, 25-hydroxy  . CBC with Differential/Platelet  . Comprehensive metabolic panel

## 2016-03-02 NOTE — Progress Notes (Signed)
Pre visit review using our clinic review tool, if applicable. No additional management support is needed unless otherwise documented below in the visit note. 

## 2016-03-02 NOTE — Progress Notes (Signed)
Subjective:   Patient ID: Melissa [name], female    DOB: [birth year], 45 y.o.   MRN: [id number]  Melissa [name] is a pleasant 45 y.o. year old female who presents to clinic today with Alopecia  on 03/02/2016  HPI:  Noticed thinning of her hair soon after she was placed on depakote by her psychiatrist.  She stopped taking depaoke 3 months ago due to hair loss.  Initially felt hair loss improved but now seeing clumps in the shower again.  No bald spots.  Denies any fatigue.  No other changes in rxs.    Lab Results  Component Value Date   WBC 8.8 11/05/2015   HGB 14.4 11/05/2015   HCT 42.9 11/05/2015   MCV 85.5 11/05/2015   PLT 240 11/05/2015   Lab Results  Component Value Date   TSH 1.27 03/12/2014   Lab Results  Component Value Date   [id number] 09/09/2014   Current Outpatient Prescriptions on File Prior to Visit  Medication Sig Dispense Refill  . Cholecalciferol (VITAMIN D3) 1000 UNITS CAPS Take 2,000 Units by mouth daily.     . clonazePAM (KLONOPIN) 1 MG tablet Take 1 tablet (1 mg total) by mouth 2 (two) times daily as needed. for anxiety 60 tablet 0  . diclofenac sodium (VOLTAREN) 1 % GEL Apply to affected area three to four times daily. 100 g 0  . fexofenadine (ALLEGRA) 180 MG tablet Take 180 mg by mouth daily.    Marland Kitchen gabapentin (NEURONTIN) 600 MG tablet TAKE ONE (1) TABLET BY MOUTH THREE TIMES A DAY 90 tablet 5  . HYDROcodone-acetaminophen (NORCO) 5-325 MG tablet Take 1 tablet by mouth every 6 (six) hours as needed for moderate pain. 60 tablet 0  . medroxyPROGESTERone (DEPO-PROVERA) 150 MG/ML injection Inject 150 mg into the muscle every 3 (three) months.    . ondansetron (ZOFRAN-ODT) 4 MG disintegrating tablet Take 4 mg by mouth every 8 (eight) hours as needed for nausea or vomiting.     . ranitidine (ZANTAC) 150 MG tablet Take 150 mg by mouth daily.    Marland Kitchen tiZANidine (ZANAFLEX) 4 MG tablet TAKE 1 TABLET BY MOUTH EVERY 8 HOURS AS NEEDED FOR MUSCLE SPASMS 30 tablet 5  .  vitamin B-12 (CYANOCOBALAMIN) 1000 MCG tablet Take 1,000 mcg by mouth daily.     No current facility-administered medications on file prior to visit.     Allergies  Allergen Reactions  . Abilify [Aripiprazole] Other (See Comments)    Weight gain, did not help,   . Depakote [Divalproex Sodium] Other (See Comments)    Hair loss  . Erythromycin Base Swelling  . Lamotrigine Other (See Comments)    Weight gain, angioedema, hives  . Seroquel [Quetiapine Fumarate] Other (See Comments)    irritable  . Tetanus Toxoids     Other reaction(s): OTHER  . Tramadol   . Ziprasidone Hcl Other (See Comments)    Heat/cold intolerance.   . Azithromycin Rash  . Lithium Rash  . Penicillins Rash    Has patient had a PCN reaction causing immediate rash, facial/tongue/throat swelling, SOB or lightheadedness with hypotension: Yes- swelling, rash  Has patient had a PCN reaction causing severe rash involving mucus membranes or skin necrosis: Yes Has patient had a PCN reaction that required hospitalization No Has patient had a PCN reaction occurring within the last 10 years: No If all of the above answers are "NO", then may proceed with Cephalosporin use.   . Pregabalin Palpitations  Manic symptoms, no sleep x's 3 days, no appetite.  . Sulfa Antibiotics Rash    Past Medical History:  Diagnosis Date  . Arthritis   . Bipolar disorder ([place])   . Fibromyalgia    on disability  . Migraine   . Muscle spasm   . Pain of right side of body    chronic  . Panic attack   . POTS (postural orthostatic tachycardia syndrome)   . Syncope    recurrent  . Uterine perforation    hx of    Past Surgical History:  Procedure Laterality Date  . CATARACT EXTRACTION  dec 2014  . DILATION AND CURETTAGE OF UTERUS    . LASIK  dec 2014    Family History  Problem Relation Age of Onset  . Diabetes Mother   . Hypertension Mother   . Cancer Father     brain tumor  . Cancer Maternal Grandmother   . Cancer Maternal  Grandfather   . Cancer Paternal Grandmother   . Cancer Paternal Grandfather   . Migraines Neg Hx     Social History   Social History  . Marital status: Married    Spouse name: Izell [place]  . Number of children: 0  . Years of education: HS/college   Occupational History  .  Other    disabled   Social History Main Topics  . Smoking status: Never Smoker  . Smokeless tobacco: Never Used  . Alcohol use No     Comment: 1 holiday drink yearly  . Drug use: No  . Sexual activity: Yes    Birth control/ protection: Injection   Other Topics Concern  . Not on file   Social History Narrative   Patient is married Izell [place]) and lives at home with her husband.   Married 20+ years   From [place]   On disability for fibromyalgia   Caffeine Use: none       The PMH, PSH, Social History, Family History, Medications, and allergies have been reviewed in [facility], and have been updated if relevant.   Review of Systems  Constitutional: Negative.   HENT: Negative.   Respiratory: Negative.   Cardiovascular: Negative.   Genitourinary: Negative.   Musculoskeletal: Negative.   Skin:       + hair loss  Allergic/Immunologic: Negative.   Neurological: Negative.   Hematological: Negative.   Psychiatric/Behavioral: Negative.   All other systems reviewed and are negative.      Objective:    BP 118/78   Pulse 78   Temp 97.9 F (36.6 C) (Oral)   Wt 167 lb 12 oz (76.1 kg)   SpO2 96%   BMI 24.77 kg/m    Physical Exam  Constitutional: She is oriented to person, place, and time. She appears well-developed and well-nourished. No distress.  HENT:  Head: Normocephalic.  Eyes: Conjunctivae are normal.  Cardiovascular: Normal rate.   Pulmonary/Chest: Effort normal.  Musculoskeletal: Normal range of motion.  Neurological: She is alert and oriented to person, place, and time. No cranial nerve deficit.  Skin: She is not diaphoretic.  No areas of balding visible on her scalp  Psychiatric: She has a  normal mood and affect. Her behavior is normal. Judgment and thought content normal.  Nursing note and vitals reviewed.         Assessment & Plan:   Alopecia No Follow-up on file.

## 2016-03-15 ENCOUNTER — Telehealth: Payer: Self-pay | Admitting: *Deleted

## 2016-03-15 NOTE — Telephone Encounter (Signed)
No I am not sure what is causing these new symptoms.  Please have her make an appt to see me.

## 2016-03-15 NOTE — Telephone Encounter (Signed)
She said none of her meds have changed.

## 2016-03-15 NOTE — Telephone Encounter (Signed)
Patient left a voicemail stating that she was seen about 2 weeks ago because of her hair falling out. Patient stated that this started about 5 1/2 weeks ago and it is not slowing down. Patient stated that she started with hot flashes and cold chills about 3-4 days ago. Patient wants to know if Dr. Deborra Medina has any more suggestions regarding this?

## 2016-03-15 NOTE — Telephone Encounter (Signed)
appt scheduled

## 2016-03-15 NOTE — Telephone Encounter (Signed)
Have any of her psychiatric medications changed again that I am not aware of?

## 2016-03-17 ENCOUNTER — Ambulatory Visit (INDEPENDENT_AMBULATORY_CARE_PROVIDER_SITE_OTHER): Payer: PPO | Admitting: Family Medicine

## 2016-03-17 ENCOUNTER — Encounter: Payer: Self-pay | Admitting: Family Medicine

## 2016-03-17 VITALS — BP 110/72 | HR 88 | Temp 98.0°F | Wt 165.0 lb

## 2016-03-17 DIAGNOSIS — L659 Nonscarring hair loss, unspecified: Secondary | ICD-10-CM

## 2016-03-17 DIAGNOSIS — N951 Menopausal and female climacteric states: Secondary | ICD-10-CM

## 2016-03-17 DIAGNOSIS — R232 Flushing: Secondary | ICD-10-CM

## 2016-03-17 NOTE — Progress Notes (Signed)
Subjective:   Patient ID: Melissa [name], female    DOB: 1970/07/04, 45 y.o.   MRN: [id number]  Melissa [name] is a pleasant 45 y.o. year old female who presents to clinic today with Hormonal Issues (Pt states her hair has been falling for the last 5-6 weeks. She has also had issues with temperature changes. Hot one minute, cold the next)  on 03/17/2016  HPI:  Noticed thinning of her hair soon after she was placed on depakote by her psychiatrist.  She stopped taking depaoke 3 months ago due to hair loss.  Initially felt hair loss improved but now seeing clumps in the shower again.  No bald spots.  Denies any fatigue.  No other changes in rxs. We checked labs when she first presented with these symptoms earlier this month.   CBC, TSH, CMET, Vit D, vit B12 unremarkable.  Now also noticing fluctuations in her body temp- hot one minute, cold the next.  Has been on depo provera for years for heavy bleeding after ablation and d and c were unsuccessful.  Has not had a period since.  Lab Results  Component Value Date   WBC 6.1 03/02/2016   HGB 14.0 03/02/2016   HCT 40.9 03/02/2016   MCV 85.3 03/02/2016   PLT 223.0 03/02/2016   Lab Results  Component Value Date   TSH 1.27 03/12/2014   Lab Results  Component Value Date   VITAMINB12 1,066 (H) 03/02/2016   Current Outpatient Prescriptions on File Prior to Visit  Medication Sig Dispense Refill  . Cholecalciferol (VITAMIN D3) 1000 UNITS CAPS Take 2,000 Units by mouth daily.     . clonazePAM (KLONOPIN) 1 MG tablet Take 1 tablet (1 mg total) by mouth 2 (two) times daily as needed. for anxiety 60 tablet 0  . diclofenac sodium (VOLTAREN) 1 % GEL Apply to affected area three to four times daily. 100 g 0  . fexofenadine (ALLEGRA) 180 MG tablet Take 180 mg by mouth daily.    Marland Kitchen gabapentin (NEURONTIN) 600 MG tablet TAKE ONE (1) TABLET BY MOUTH THREE TIMES A DAY 90 tablet 5  . HYDROcodone-acetaminophen (NORCO) 5-325 MG tablet Take 1 tablet by mouth  every 6 (six) hours as needed for moderate pain. 60 tablet 0  . medroxyPROGESTERone (DEPO-PROVERA) 150 MG/ML injection Inject 150 mg into the muscle every 3 (three) months.    . ondansetron (ZOFRAN-ODT) 4 MG disintegrating tablet Take 4 mg by mouth every 8 (eight) hours as needed for nausea or vomiting.     . ranitidine (ZANTAC) 150 MG tablet Take 150 mg by mouth daily.    Marland Kitchen tiZANidine (ZANAFLEX) 4 MG tablet TAKE 1 TABLET BY MOUTH EVERY 8 HOURS AS NEEDED FOR MUSCLE SPASMS 30 tablet 5  . vitamin B-12 (CYANOCOBALAMIN) 1000 MCG tablet Take 1,000 mcg by mouth daily.     No current facility-administered medications on file prior to visit.     Allergies  Allergen Reactions  . Abilify [Aripiprazole] Other (See Comments)    Weight gain, did not help,   . Depakote [Divalproex Sodium] Other (See Comments)    Hair loss  . Erythromycin Base Swelling  . Lamotrigine Other (See Comments)    Weight gain, angioedema, hives  . Seroquel [Quetiapine Fumarate] Other (See Comments)    irritable  . Tetanus Toxoids     Other reaction(s): OTHER  . Tramadol   . Ziprasidone Hcl Other (See Comments)    Heat/cold intolerance.   . Azithromycin Rash  .  Lithium Rash  . Penicillins Rash    Has patient had a PCN reaction causing immediate rash, facial/tongue/throat swelling, SOB or lightheadedness with hypotension: Yes- swelling, rash  Has patient had a PCN reaction causing severe rash involving mucus membranes or skin necrosis: Yes Has patient had a PCN reaction that required hospitalization No Has patient had a PCN reaction occurring within the last 10 years: No If all of the above answers are "NO", then may proceed with Cephalosporin use.   . Pregabalin Palpitations    Manic symptoms, no sleep x's 3 days, no appetite.  . Sulfa Antibiotics Rash    Past Medical History:  Diagnosis Date  . Arthritis   . Bipolar disorder ([place])   . Fibromyalgia    on disability  . Migraine   . Muscle spasm   . Pain of  right side of body    chronic  . Panic attack   . POTS (postural orthostatic tachycardia syndrome)   . Syncope    recurrent  . Uterine perforation    hx of    Past Surgical History:  Procedure Laterality Date  . CATARACT EXTRACTION  dec 2014  . DILATION AND CURETTAGE OF UTERUS    . LASIK  dec 2014    Family History  Problem Relation Age of Onset  . Diabetes Mother   . Hypertension Mother   . Cancer Father     brain tumor  . Cancer Maternal Grandmother   . Cancer Maternal Grandfather   . Cancer Paternal Grandmother   . Cancer Paternal Grandfather   . Migraines Neg Hx     Social History   Social History  . Marital status: Married    Spouse name: Izell Proctor  . Number of children: 0  . Years of education: HS/college   Occupational History  .  Other    disabled   Social History Main Topics  . Smoking status: Never Smoker  . Smokeless tobacco: Never Used  . Alcohol use No     Comment: 1 holiday drink yearly  . Drug use: No  . Sexual activity: Yes    Birth control/ protection: Injection   Other Topics Concern  . Not on file   Social History Narrative   Patient is married Izell [place]) and lives at home with her husband.   Married 20+ years   From [place]   On disability for fibromyalgia   Caffeine Use: none       The PMH, PSH, Social History, Family History, Medications, and allergies have been reviewed in [facility], and have been updated if relevant.   Review of Systems  Constitutional: Negative.   HENT: Negative.   Respiratory: Negative.   Cardiovascular: Negative.   Endocrine: Positive for cold intolerance and heat intolerance.  Genitourinary: Negative.   Musculoskeletal: Negative.   Skin:       + hair loss  Allergic/Immunologic: Negative.   Neurological: Negative.   Hematological: Negative.   Psychiatric/Behavioral: Negative.   All other systems reviewed and are negative.      Objective:    BP 110/72 (BP Location: Left Arm, Patient Position: Sitting,  Cuff Size: Normal)   Pulse 88   Temp 98 F (36.7 C) (Oral)   Wt 165 lb (74.8 kg)   SpO2 98%   BMI 24.37 kg/m    Physical Exam  Constitutional: She is oriented to person, place, and time. She appears well-developed and well-nourished. No distress.  HENT:  Head: Normocephalic.  Eyes: Conjunctivae are normal.  Cardiovascular:  Normal rate.   Pulmonary/Chest: Effort normal.  Musculoskeletal: Normal range of motion.  Neurological: She is alert and oriented to person, place, and time. No cranial nerve deficit.  Skin: She is not diaphoretic.  No areas of balding visible on her scalp  Psychiatric: She has a normal mood and affect. Her behavior is normal. Judgment and thought content normal.  Nursing note and vitals reviewed.         Assessment & Plan:   Alopecia - Plan: Cortisol-am, blood  Hot flashes - Plan: Cortisol-am, blood No Follow-up on file.

## 2016-03-17 NOTE — Patient Instructions (Signed)
Great to see you. Let's try stopping depo provera as we discussed.

## 2016-03-17 NOTE — Telephone Encounter (Signed)
Please close encounter if completed

## 2016-03-17 NOTE — Assessment & Plan Note (Signed)
>  25 minutes spent in face to face time with patient, >50% spent in counselling or coordination of care With hot flashes. ? Perimenopause. Agreed to stop depo to see if her bleeding returns, check FSH/LH in a few months. Am cortisol today. Advised to cut hair a little shorter to take weight off.  Continue Vit b12 supplement. The patient indicates understanding of these issues and agrees with the plan.

## 2016-03-17 NOTE — Progress Notes (Signed)
Pre visit review using our clinic review tool, if applicable. No additional management support is needed unless otherwise documented below in the visit note. 

## 2016-03-18 ENCOUNTER — Encounter: Payer: Self-pay | Admitting: *Deleted

## 2016-03-18 LAB — CORTISOL-AM, BLOOD: CORTISOL - AM: 13.2 ug/dL

## 2016-03-23 DIAGNOSIS — F3132 Bipolar disorder, current episode depressed, moderate: Secondary | ICD-10-CM | POA: Diagnosis not present

## 2016-03-23 DIAGNOSIS — Z79899 Other long term (current) drug therapy: Secondary | ICD-10-CM | POA: Diagnosis not present

## 2016-04-26 ENCOUNTER — Ambulatory Visit (INDEPENDENT_AMBULATORY_CARE_PROVIDER_SITE_OTHER): Payer: PPO | Admitting: Family Medicine

## 2016-04-26 ENCOUNTER — Encounter: Payer: Self-pay | Admitting: Family Medicine

## 2016-04-26 VITALS — BP 116/74 | HR 95 | Temp 98.7°F | Wt 163.8 lb

## 2016-04-26 DIAGNOSIS — F3111 Bipolar disorder, current episode manic without psychotic features, mild: Secondary | ICD-10-CM | POA: Diagnosis not present

## 2016-04-26 DIAGNOSIS — F310 Bipolar disorder, current episode hypomanic: Secondary | ICD-10-CM

## 2016-04-26 DIAGNOSIS — F3170 Bipolar disorder, currently in remission, most recent episode unspecified: Secondary | ICD-10-CM

## 2016-04-26 DIAGNOSIS — F411 Generalized anxiety disorder: Secondary | ICD-10-CM | POA: Diagnosis not present

## 2016-04-26 DIAGNOSIS — Z79899 Other long term (current) drug therapy: Secondary | ICD-10-CM | POA: Diagnosis not present

## 2016-04-26 DIAGNOSIS — Z30013 Encounter for initial prescription of injectable contraceptive: Secondary | ICD-10-CM

## 2016-04-26 DIAGNOSIS — L659 Nonscarring hair loss, unspecified: Secondary | ICD-10-CM | POA: Diagnosis not present

## 2016-04-26 NOTE — Progress Notes (Signed)
Subjective:   Patient ID: Melissa [name], female    DOB: [birth year], 45 y.o.   MRN: [id number]  Melissa [name] is a pleasant 45 y.o. year old female who presents to clinic today with Follow-up  on 04/26/2016  HPI:  Going to skip Depo today.  She wants to see if her hair will grow back if she stops depo.  Psychiatrist recently added low dose zyprexa.  Has not yet started taking this.  Has had more mania lately- sleeps for a few hours and wakes up alert and mind racing.  Current Outpatient Prescriptions on File Prior to Visit  Medication Sig Dispense Refill  . Cholecalciferol (VITAMIN D3) 1000 UNITS CAPS Take 2,000 Units by mouth daily.     . clonazePAM (KLONOPIN) 1 MG tablet Take 1 tablet (1 mg total) by mouth 2 (two) times daily as needed. for anxiety 60 tablet 0  . diclofenac sodium (VOLTAREN) 1 % GEL Apply to affected area three to four times daily. 100 g 0  . fexofenadine (ALLEGRA) 180 MG tablet Take 180 mg by mouth daily.    Marland Kitchen gabapentin (NEURONTIN) 600 MG tablet TAKE ONE (1) TABLET BY MOUTH THREE TIMES A DAY 90 tablet 5  . HYDROcodone-acetaminophen (NORCO) 5-325 MG tablet Take 1 tablet by mouth every 6 (six) hours as needed for moderate pain. 60 tablet 0  . medroxyPROGESTERone (DEPO-PROVERA) 150 MG/ML injection Inject 150 mg into the muscle every 3 (three) months.    . ondansetron (ZOFRAN-ODT) 4 MG disintegrating tablet Take 4 mg by mouth every 8 (eight) hours as needed for nausea or vomiting.     . ranitidine (ZANTAC) 150 MG tablet Take 150 mg by mouth daily.    Marland Kitchen tiZANidine (ZANAFLEX) 4 MG tablet TAKE 1 TABLET BY MOUTH EVERY 8 HOURS AS NEEDED FOR MUSCLE SPASMS 30 tablet 5  . vitamin B-12 (CYANOCOBALAMIN) 1000 MCG tablet Take 1,000 mcg by mouth daily.     No current facility-administered medications on file prior to visit.     Allergies  Allergen Reactions  . Abilify [Aripiprazole] Other (See Comments)    Weight gain, did not help,   . Depakote [Divalproex Sodium] Other (See  Comments)    Hair loss  . Erythromycin Base Swelling  . Lamotrigine Other (See Comments)    Weight gain, angioedema, hives  . Seroquel [Quetiapine Fumarate] Other (See Comments)    irritable  . Tetanus Toxoids     Other reaction(s): OTHER  . Tramadol   . Ziprasidone Hcl Other (See Comments)    Heat/cold intolerance.   . Azithromycin Rash  . Lithium Rash  . Penicillins Rash    Has patient had a PCN reaction causing immediate rash, facial/tongue/throat swelling, SOB or lightheadedness with hypotension: Yes- swelling, rash  Has patient had a PCN reaction causing severe rash involving mucus membranes or skin necrosis: Yes Has patient had a PCN reaction that required hospitalization No Has patient had a PCN reaction occurring within the last 10 years: No If all of the above answers are "NO", then may proceed with Cephalosporin use.   . Pregabalin Palpitations    Manic symptoms, no sleep x's 3 days, no appetite.  . Sulfa Antibiotics Rash    Past Medical History:  Diagnosis Date  . Arthritis   . Bipolar disorder ([place])   . Fibromyalgia    on disability  . Migraine   . Muscle spasm   . Pain of right side of body    chronic  .  Panic attack   . POTS (postural orthostatic tachycardia syndrome)   . Syncope    recurrent  . Uterine perforation    hx of    Past Surgical History:  Procedure Laterality Date  . CATARACT EXTRACTION  dec 2014  . DILATION AND CURETTAGE OF UTERUS    . LASIK  dec 2014    Family History  Problem Relation Age of Onset  . Diabetes Mother   . Hypertension Mother   . Cancer Father     brain tumor  . Cancer Maternal Grandmother   . Cancer Maternal Grandfather   . Cancer Paternal Grandmother   . Cancer Paternal Grandfather   . Migraines Neg Hx     Social History   Social History  . Marital status: Married    Spouse name: Melissa [name]  . Number of children: 0  . Years of education: HS/college   Occupational History  .  Other    disabled   Social  History Main Topics  . Smoking status: Never Smoker  . Smokeless tobacco: Never Used  . Alcohol use No     Comment: 1 holiday drink yearly  . Drug use: No  . Sexual activity: Yes    Birth control/ protection: Injection   Other Topics Concern  . Not on file   Social History Narrative   Patient is married Melissa [name]) and lives at home with her husband.   Married 20+ years   From [place]   On disability for fibromyalgia   Caffeine Use: none       The PMH, PSH, Social History, Family History, Medications, and allergies have been reviewed in [facility], and have been updated if relevant.  Review of Systems  Constitutional: Negative.   HENT: Negative.   Respiratory: Negative.   Cardiovascular: Negative.   Psychiatric/Behavioral: Positive for sleep disturbance. Negative for dysphoric mood, self-injury and suicidal ideas. The patient is nervous/anxious and is hyperactive.   All other systems reviewed and are negative.      Objective:    BP 116/74   Pulse 95   Temp 98.7 F (37.1 C) (Oral)   Wt 163 lb 12 oz (74.3 kg)   SpO2 98%   BMI 24.18 kg/m    Physical Exam  Constitutional: She is oriented to person, place, and time. She appears well-developed and well-nourished. No distress.  HENT:  Head: Normocephalic and atraumatic.  Eyes: Conjunctivae are normal.  Cardiovascular: Normal rate and regular rhythm.   Pulmonary/Chest: Effort normal and breath sounds normal.  Musculoskeletal: Normal range of motion. She exhibits no edema.  Neurological: She is alert and oriented to person, place, and time. No cranial nerve deficit.  Skin: Skin is warm and dry. She is not diaphoretic.  Psychiatric: She has a normal mood and affect. Her behavior is normal. Judgment and thought content normal.  Nursing note and vitals reviewed.         Assessment & Plan:   Encounter for initial prescription of injectable contraceptive No Follow-up on file.

## 2016-04-26 NOTE — Progress Notes (Signed)
Pre visit review using our clinic review tool, if applicable. No additional management support is needed unless otherwise documented below in the visit note. 

## 2016-04-27 ENCOUNTER — Ambulatory Visit: Payer: PPO | Admitting: Family Medicine

## 2016-04-27 NOTE — Assessment & Plan Note (Signed)
Deteriorated with recent bouts of mania. Managed by psychiatry. No changes made today.

## 2016-04-27 NOTE — Assessment & Plan Note (Signed)
Persistent.  She agrees to not restart depo today to see if that helps. Aware that her period may restart although we are not sure.

## 2016-05-19 ENCOUNTER — Telehealth: Payer: Self-pay | Admitting: Family Medicine

## 2016-05-19 NOTE — Telephone Encounter (Signed)
Pt will call back to schedule AWV/CPE with Dr. Deborra Medina. Pt declined to schedule with L. Pinson

## 2016-05-26 ENCOUNTER — Other Ambulatory Visit: Payer: Self-pay | Admitting: *Deleted

## 2016-05-26 MED ORDER — CLONAZEPAM 1 MG PO TABS: 1.0000 mg | ORAL_TABLET | Freq: Two times a day (BID) | ORAL | 0 refills | Status: AC | PRN

## 2016-05-26 NOTE — Telephone Encounter (Signed)
Last f/u 04/2016

## 2016-05-26 NOTE — Telephone Encounter (Signed)
Rx called in to requested pharmacy 

## 2016-05-27 DIAGNOSIS — F410 Panic disorder [episodic paroxysmal anxiety] without agoraphobia: Secondary | ICD-10-CM | POA: Diagnosis not present

## 2016-05-27 DIAGNOSIS — Z79899 Other long term (current) drug therapy: Secondary | ICD-10-CM | POA: Diagnosis not present

## 2016-05-27 DIAGNOSIS — F3132 Bipolar disorder, current episode depressed, moderate: Secondary | ICD-10-CM | POA: Diagnosis not present

## 2016-06-15 ENCOUNTER — Other Ambulatory Visit: Payer: Self-pay | Admitting: Family Medicine

## 2016-06-15 DIAGNOSIS — E785 Hyperlipidemia, unspecified: Secondary | ICD-10-CM

## 2016-06-15 DIAGNOSIS — E559 Vitamin D deficiency, unspecified: Secondary | ICD-10-CM

## 2016-06-15 DIAGNOSIS — E538 Deficiency of other specified B group vitamins: Secondary | ICD-10-CM

## 2016-06-24 ENCOUNTER — Other Ambulatory Visit (INDEPENDENT_AMBULATORY_CARE_PROVIDER_SITE_OTHER): Payer: PPO

## 2016-06-24 DIAGNOSIS — E785 Hyperlipidemia, unspecified: Secondary | ICD-10-CM

## 2016-06-24 DIAGNOSIS — E559 Vitamin D deficiency, unspecified: Secondary | ICD-10-CM | POA: Diagnosis not present

## 2016-06-24 DIAGNOSIS — R7989 Other specified abnormal findings of blood chemistry: Secondary | ICD-10-CM | POA: Diagnosis not present

## 2016-06-24 DIAGNOSIS — E538 Deficiency of other specified B group vitamins: Secondary | ICD-10-CM | POA: Diagnosis not present

## 2016-06-24 LAB — COMPREHENSIVE METABOLIC PANEL
ALT: 12 U/L (ref 0–35)
AST: 15 U/L (ref 0–37)
Albumin: 4.1 g/dL (ref 3.5–5.2)
Alkaline Phosphatase: 63 U/L (ref 39–117)
BILIRUBIN TOTAL: 0.5 mg/dL (ref 0.2–1.2)
BUN: 9 mg/dL (ref 6–23)
CALCIUM: 9 mg/dL (ref 8.4–10.5)
CO2: 29 meq/L (ref 19–32)
Chloride: 108 mEq/L (ref 96–112)
Creatinine, Ser: 1 mg/dL (ref 0.40–1.20)
GFR: 63.22 mL/min (ref >60.00)
Glucose, Bld: 83 mg/dL (ref 70–99)
Potassium: 4.2 mEq/L (ref 3.5–5.1)
Sodium: 141 mEq/L (ref 135–145)
Total Protein: 6.5 g/dL (ref 6.0–8.3)

## 2016-06-24 LAB — LIPID PANEL
Cholesterol: 245 mg/dL — ABNORMAL HIGH (ref 0–200)
HDL: 40.2 mg/dL (ref >39.00)
NONHDL: 204.62
TRIGLYCERIDES: 363 mg/dL — AB (ref 0.0–149.0)
Total CHOL/HDL Ratio: 6
VLDL: 72.6 mg/dL — AB (ref 0.0–40.0)

## 2016-06-24 LAB — VITAMIN D 25 HYDROXY (VIT D DEFICIENCY, FRACTURES): VITD: 26.48 ng/mL — ABNORMAL LOW (ref 30.00–100.00)

## 2016-06-24 LAB — VITAMIN B12: Vitamin B-12: 697 pg/mL (ref 211–911)

## 2016-06-24 LAB — LDL CHOLESTEROL, DIRECT: Direct LDL: 144 mg/dL

## 2016-06-28 ENCOUNTER — Encounter: Payer: Self-pay | Admitting: Family Medicine

## 2016-06-28 ENCOUNTER — Ambulatory Visit (INDEPENDENT_AMBULATORY_CARE_PROVIDER_SITE_OTHER): Payer: PPO | Admitting: Family Medicine

## 2016-06-28 VITALS — BP 124/82 | HR 73 | Temp 98.4°F | Wt 166.5 lb

## 2016-06-28 DIAGNOSIS — R232 Flushing: Secondary | ICD-10-CM | POA: Diagnosis not present

## 2016-06-28 DIAGNOSIS — R000 Tachycardia, unspecified: Secondary | ICD-10-CM

## 2016-06-28 DIAGNOSIS — L659 Nonscarring hair loss, unspecified: Secondary | ICD-10-CM | POA: Diagnosis not present

## 2016-06-28 DIAGNOSIS — G90A Postural orthostatic tachycardia syndrome (POTS): Secondary | ICD-10-CM

## 2016-06-28 DIAGNOSIS — I951 Orthostatic hypotension: Secondary | ICD-10-CM

## 2016-06-28 DIAGNOSIS — E559 Vitamin D deficiency, unspecified: Secondary | ICD-10-CM | POA: Diagnosis not present

## 2016-06-28 MED ORDER — VITAMIN D (ERGOCALCIFEROL) 1.25 MG (50000 UNIT) PO CAPS: 50000.0000 [IU] | ORAL_CAPSULE | ORAL | 0 refills | Status: DC

## 2016-06-28 NOTE — Assessment & Plan Note (Signed)
Improved but still low. Will add high dose weekly Vit D for 7 weeks. The patient indicates understanding of these issues and agrees with the plan.

## 2016-06-28 NOTE — Progress Notes (Signed)
Subjective:   Patient ID: Melissa [name], female    DOB: [birth year], 46 y.o.   MRN: [id number]  Melissa [name] is a pleasant 46 y.o. year old female who presents to clinic today with Follow-up  on 06/28/2016  HPI:  Feeling a bit better. Still tired and intermittently dizzy.  Vit D a little better but still low.  Vit B12 now normal.  Has not had depo in 2 months.  Only minimal spotting.  Feels ok.  Still having some hot flashes. Current Outpatient Prescriptions on File Prior to Visit  Medication Sig Dispense Refill  . Cholecalciferol (VITAMIN D3) 1000 UNITS CAPS Take 2,000 Units by mouth daily.     . clonazePAM (KLONOPIN) 1 MG tablet Take 1 tablet (1 mg total) by mouth 2 (two) times daily as needed. for anxiety 60 tablet 0  . diclofenac sodium (VOLTAREN) 1 % GEL Apply to affected area three to four times daily. 100 g 0  . fexofenadine (ALLEGRA) 180 MG tablet Take 180 mg by mouth daily.    Marland Kitchen gabapentin (NEURONTIN) 600 MG tablet TAKE ONE (1) TABLET BY MOUTH THREE TIMES A DAY 90 tablet 5  . HYDROcodone-acetaminophen (NORCO) 5-325 MG tablet Take 1 tablet by mouth every 6 (six) hours as needed for moderate pain. 60 tablet 0  . ondansetron (ZOFRAN-ODT) 4 MG disintegrating tablet Take 4 mg by mouth every 8 (eight) hours as needed for nausea or vomiting.     . ranitidine (ZANTAC) 150 MG tablet Take 150 mg by mouth daily.    Marland Kitchen tiZANidine (ZANAFLEX) 4 MG tablet TAKE 1 TABLET BY MOUTH EVERY 8 HOURS AS NEEDED FOR MUSCLE SPASMS 30 tablet 5  . vitamin B-12 (CYANOCOBALAMIN) 1000 MCG tablet Take 1,000 mcg by mouth daily.     No current facility-administered medications on file prior to visit.     Allergies  Allergen Reactions  . Abilify [Aripiprazole] Other (See Comments)    Weight gain, did not help,   . Depakote [Divalproex Sodium] Other (See Comments)    Hair loss  . Erythromycin Base Swelling  . Lamotrigine Other (See Comments)    Weight gain, angioedema, hives  . Seroquel [Quetiapine  Fumarate] Other (See Comments)    irritable  . Tetanus Toxoids     Other reaction(s): OTHER  . Tramadol   . Ziprasidone Hcl Other (See Comments)    Heat/cold intolerance.   . Azithromycin Rash  . Lithium Rash  . Penicillins Rash    Has patient had a PCN reaction causing immediate rash, facial/tongue/throat swelling, SOB or lightheadedness with hypotension: Yes- swelling, rash  Has patient had a PCN reaction causing severe rash involving mucus membranes or skin necrosis: Yes Has patient had a PCN reaction that required hospitalization No Has patient had a PCN reaction occurring within the last 10 years: No If all of the above answers are "NO", then may proceed with Cephalosporin use.   . Pregabalin Palpitations    Manic symptoms, no sleep x's 3 days, no appetite.  . Sulfa Antibiotics Rash    Past Medical History:  Diagnosis Date  . Arthritis   . Bipolar disorder ([place])   . Fibromyalgia    on disability  . Migraine   . Muscle spasm   . Pain of right side of body    chronic  . Panic attack   . POTS (postural orthostatic tachycardia syndrome)   . Syncope    recurrent  . Uterine perforation    hx of  Past Surgical History:  Procedure Laterality Date  . CATARACT EXTRACTION  dec 2014  . DILATION AND CURETTAGE OF UTERUS    . LASIK  dec 2014    Family History  Problem Relation Age of Onset  . Diabetes Mother   . Hypertension Mother   . Cancer Father     brain tumor  . Cancer Maternal Grandmother   . Cancer Maternal Grandfather   . Cancer Paternal Grandmother   . Cancer Paternal Grandfather   . Migraines Neg Hx     Social History   Social History  . Marital status: Married    Spouse name: Melissa [name]  . Number of children: 0  . Years of education: HS/college   Occupational History  .  Other    disabled   Social History Main Topics  . Smoking status: Never Smoker  . Smokeless tobacco: Never Used  . Alcohol use No     Comment: 1 holiday drink yearly  . Drug  use: No  . Sexual activity: Yes    Birth control/ protection: Injection   Other Topics Concern  . Not on file   Social History Narrative   Patient is married Melissa [place]) and lives at home with her husband.   Married 20+ years   From [place]   On disability for fibromyalgia   Caffeine Use: none       The PMH, PSH, Social History, Family History, Medications, and allergies have been reviewed in [facility], and have been updated if relevant.   Review of Systems  Constitutional: Positive for diaphoresis.  HENT: Negative.   Respiratory: Negative.   Cardiovascular: Negative.   Gastrointestinal: Negative.   Endocrine: Negative.   Genitourinary: Negative.   Musculoskeletal: Negative.   Allergic/Immunologic: Negative.   Neurological: Positive for dizziness.  Hematological: Negative.   Psychiatric/Behavioral: Negative.   All other systems reviewed and are negative.      Objective:    BP 124/82   Pulse 73   Temp 98.4 F (36.9 C) (Oral)   Wt 166 lb 8 oz (75.5 kg)   SpO2 98%   BMI 24.59 kg/m    Physical Exam   General:  Well-developed,well-nourished,in no acute distress; alert,appropriate and cooperative throughout examination Head:  normocephalic and atraumatic.   Nose:  no external deformity.   Mouth:  good dentition.   Neck:  No deformities, masses, or tenderness noted.  Lungs:  Normal respiratory effort, chest expands symmetrically. Lungs are clear to auscultation, no crackles or wheezes. Heart:  Normal rate and regular rhythm. S1 and S2 normal without gallop, murmur, click, rub or other extra sounds. Abdomen:  Bowel sounds positive,abdomen soft and non-tender without masses, organomegaly or hernias noted. Msk:  No deformity or scoliosis noted of thoracic or lumbar spine.   Extremities:  No clubbing, cyanosis, edema, or deformity noted with normal full range of motion of all joints.   Neurologic:  alert & oriented X3 and gait normal.   Skin:  Intact without suspicious lesions  or rashes Psych:  Cognition and judgment appear intact. Alert and cooperative with normal attention span and concentration. No apparent delusions, illusions, hallucinations        Assessment & Plan:   Vitamin D deficiency  Hot flashes  POTS (postural orthostatic tachycardia syndrome) No Follow-up on file.

## 2016-06-28 NOTE — Assessment & Plan Note (Signed)
Improved

## 2016-06-28 NOTE — Patient Instructions (Signed)
Great to see you. Happy New Year!!  Let me know about the Vit D.

## 2016-06-28 NOTE — Assessment & Plan Note (Signed)
Persistent. Still spotting. Will check FSH and LH in a few months. The patient indicates understanding of these issues and agrees with the plan.

## 2016-07-20 ENCOUNTER — Other Ambulatory Visit: Payer: Self-pay | Admitting: Family Medicine

## 2016-08-05 DIAGNOSIS — F314 Bipolar disorder, current episode depressed, severe, without psychotic features: Secondary | ICD-10-CM | POA: Diagnosis not present

## 2016-08-05 DIAGNOSIS — Z79899 Other long term (current) drug therapy: Secondary | ICD-10-CM | POA: Diagnosis not present

## 2016-08-14 ENCOUNTER — Other Ambulatory Visit: Payer: Self-pay | Admitting: Family Medicine

## 2016-08-14 DIAGNOSIS — R232 Flushing: Secondary | ICD-10-CM

## 2016-08-23 ENCOUNTER — Encounter (INDEPENDENT_AMBULATORY_CARE_PROVIDER_SITE_OTHER): Payer: Self-pay

## 2016-08-23 ENCOUNTER — Other Ambulatory Visit (INDEPENDENT_AMBULATORY_CARE_PROVIDER_SITE_OTHER): Payer: PPO

## 2016-08-23 DIAGNOSIS — R232 Flushing: Secondary | ICD-10-CM

## 2016-08-24 LAB — FSH/LH
FSH: 4.7 m[IU]/mL
LH: 2.5 m[IU]/mL

## 2016-08-26 ENCOUNTER — Ambulatory Visit (INDEPENDENT_AMBULATORY_CARE_PROVIDER_SITE_OTHER): Payer: PPO | Admitting: Family Medicine

## 2016-08-26 VITALS — BP 110/70 | HR 80 | Temp 98.1°F | Wt 162.0 lb

## 2016-08-26 DIAGNOSIS — R232 Flushing: Secondary | ICD-10-CM | POA: Diagnosis not present

## 2016-08-26 NOTE — Progress Notes (Signed)
Subjective:   Patient ID: Melissa [name], female    DOB: Apr 06, 1971, 46 y.o.   MRN: [id number]  Melissa [name] is a pleasant 46 y.o. year old female who presents to clinic today with Follow-up (Lab results)  on 08/26/2016  HPI:  Hot flashes have improved.  Wanted to discuss her labs today.  ? Perimenopause.  Psychiatrist recently added low dose wellbutrin.  Has not yet started taking this.  Has had more mania lately- sleeps for a few hours and wakes up alert and mind racing.  Current Outpatient Prescriptions on File Prior to Visit  Medication Sig Dispense Refill  . Cholecalciferol (VITAMIN D3) 1000 UNITS CAPS Take 2,000 Units by mouth daily.     . clonazePAM (KLONOPIN) 1 MG tablet Take 1 tablet (1 mg total) by mouth 2 (two) times daily as needed. for anxiety 60 tablet 0  . diclofenac sodium (VOLTAREN) 1 % GEL Apply to affected area three to four times daily. 100 g 0  . fexofenadine (ALLEGRA) 180 MG tablet Take 180 mg by mouth daily.    Marland Kitchen gabapentin (NEURONTIN) 600 MG tablet TAKE ONE TABLET BY MOUTH THREE TIMES A DAY 90 tablet 5  . HYDROcodone-acetaminophen (NORCO) 5-325 MG tablet Take 1 tablet by mouth every 6 (six) hours as needed for moderate pain. 60 tablet 0  . ondansetron (ZOFRAN-ODT) 4 MG disintegrating tablet Take 4 mg by mouth every 8 (eight) hours as needed for nausea or vomiting.     . ranitidine (ZANTAC) 150 MG tablet Take 150 mg by mouth daily.    Marland Kitchen tiZANidine (ZANAFLEX) 4 MG tablet TAKE 1 TABLET BY MOUTH EVERY 8 HOURS AS NEEDED FOR MUSCLE SPASMS 30 tablet 5  . vitamin B-12 (CYANOCOBALAMIN) 1000 MCG tablet Take 1,000 mcg by mouth daily.    . Vitamin D, Ergocalciferol, (DRISDOL) 50000 units CAPS capsule TAKE 1 CAPSULE BY MOUTH EVERY SEVEN DAYS 7 capsule 0   No current facility-administered medications on file prior to visit.     Allergies  Allergen Reactions  . Abilify [Aripiprazole] Other (See Comments)    Weight gain, did not help,   . Depakote [Divalproex Sodium] Other  (See Comments)    Hair loss  . Erythromycin Base Swelling  . Lamotrigine Other (See Comments)    Weight gain, angioedema, hives  . Seroquel [Quetiapine Fumarate] Other (See Comments)    irritable  . Tetanus Toxoids     Other reaction(s): OTHER  . Tramadol   . Ziprasidone Hcl Other (See Comments)    Heat/cold intolerance.   . Azithromycin Rash  . Lithium Rash  . Penicillins Rash    Has patient had a PCN reaction causing immediate rash, facial/tongue/throat swelling, SOB or lightheadedness with hypotension: Yes- swelling, rash  Has patient had a PCN reaction causing severe rash involving mucus membranes or skin necrosis: Yes Has patient had a PCN reaction that required hospitalization No Has patient had a PCN reaction occurring within the last 10 years: No If all of the above answers are "NO", then may proceed with Cephalosporin use.   . Pregabalin Palpitations    Manic symptoms, no sleep x's 3 days, no appetite.  . Sulfa Antibiotics Rash    Past Medical History:  Diagnosis Date  . Arthritis   . Bipolar disorder ([place])   . Fibromyalgia    on disability  . Migraine   . Muscle spasm   . Pain of right side of body    chronic  . Panic attack   .  POTS (postural orthostatic tachycardia syndrome)   . Syncope    recurrent  . Uterine perforation    hx of    Past Surgical History:  Procedure Laterality Date  . CATARACT EXTRACTION  dec 2014  . DILATION AND CURETTAGE OF UTERUS    . LASIK  dec 2014    Family History  Problem Relation Age of Onset  . Diabetes Mother   . Hypertension Mother   . Cancer Father     brain tumor  . Cancer Maternal Grandmother   . Cancer Maternal Grandfather   . Cancer Paternal Grandmother   . Cancer Paternal Grandfather   . Migraines Neg Hx     Social History   Social History  . Marital status: Married    Spouse name: Izell [place]  . Number of children: 0  . Years of education: HS/college   Occupational History  .  Other    disabled    Social History Main Topics  . Smoking status: Never Smoker  . Smokeless tobacco: Never Used  . Alcohol use No     Comment: 1 holiday drink yearly  . Drug use: No  . Sexual activity: Yes    Birth control/ protection: Injection   Other Topics Concern  . Not on file   Social History Narrative   Patient is married Izell [place]) and lives at home with her husband.   Married 20+ years   From [place]   On disability for fibromyalgia   Caffeine Use: none       The PMH, PSH, Social History, Family History, Medications, and allergies have been reviewed in [facility], and have been updated if relevant.  Review of Systems  Constitutional: Negative.   HENT: Negative.   Respiratory: Negative.   Cardiovascular: Negative.   Psychiatric/Behavioral: Positive for sleep disturbance. Negative for dysphoric mood, self-injury and suicidal ideas. The patient is nervous/anxious and is hyperactive.   All other systems reviewed and are negative.      Objective:    BP 110/70 (BP Location: Left Arm, Patient Position: Sitting, Cuff Size: Normal)   Pulse 80   Temp 98.1 F (36.7 C) (Oral)   Wt 162 lb (73.5 kg)   SpO2 98%   BMI 23.92 kg/m    Physical Exam  Constitutional: She is oriented to person, place, and time. She appears well-developed and well-nourished. No distress.  HENT:  Head: Normocephalic and atraumatic.  Eyes: Conjunctivae are normal.  Cardiovascular: Normal rate and regular rhythm.   Pulmonary/Chest: Effort normal and breath sounds normal.  Musculoskeletal: Normal range of motion. She exhibits no edema.  Neurological: She is alert and oriented to person, place, and time. No cranial nerve deficit.  Skin: Skin is warm and dry. She is not diaphoretic.  Psychiatric: She has a normal mood and affect. Her behavior is normal. Judgment and thought content normal.  Nursing note and vitals reviewed.         Assessment & Plan:   Hot flashes No Follow-up on file.

## 2016-08-26 NOTE — Assessment & Plan Note (Signed)
Improving- unclear if she is perimenopausal yet but I suspect that she is. Hot flashes are likely mutlifactorial- including psych med adjustment. Continue to monitor symptoms as they are improving. Follow up in 3 months. The patient indicates understanding of these issues and agrees with the plan.

## 2016-08-26 NOTE — Progress Notes (Signed)
Pre visit review using our clinic review tool, if applicable. No additional management support is needed unless otherwise documented below in the visit note. 

## 2016-10-18 DIAGNOSIS — F314 Bipolar disorder, current episode depressed, severe, without psychotic features: Secondary | ICD-10-CM | POA: Diagnosis not present

## 2016-10-18 DIAGNOSIS — Z79899 Other long term (current) drug therapy: Secondary | ICD-10-CM | POA: Diagnosis not present

## 2016-11-11 ENCOUNTER — Encounter (INDEPENDENT_AMBULATORY_CARE_PROVIDER_SITE_OTHER): Payer: Self-pay

## 2016-11-11 ENCOUNTER — Ambulatory Visit (INDEPENDENT_AMBULATORY_CARE_PROVIDER_SITE_OTHER): Payer: PPO | Admitting: Family Medicine

## 2016-11-11 ENCOUNTER — Encounter: Payer: Self-pay | Admitting: Family Medicine

## 2016-11-11 VITALS — BP 120/80 | HR 80 | Wt 157.0 lb

## 2016-11-11 DIAGNOSIS — G894 Chronic pain syndrome: Secondary | ICD-10-CM

## 2016-11-11 DIAGNOSIS — Z30013 Encounter for initial prescription of injectable contraceptive: Secondary | ICD-10-CM | POA: Diagnosis not present

## 2016-11-11 DIAGNOSIS — N921 Excessive and frequent menstruation with irregular cycle: Secondary | ICD-10-CM

## 2016-11-11 DIAGNOSIS — N920 Excessive and frequent menstruation with regular cycle: Secondary | ICD-10-CM | POA: Insufficient documentation

## 2016-11-11 MED ORDER — MEDROXYPROGESTERONE ACETATE 150 MG/ML IM SUSP
150.0000 mg | Freq: Once | INTRAMUSCULAR | Status: AC
  Administered 2016-11-11: 150 mg via INTRAMUSCULAR

## 2016-11-11 MED ORDER — HYDROCODONE-ACETAMINOPHEN 5-325 MG PO TABS: 1.0000 | ORAL_TABLET | Freq: Four times a day (QID) | ORAL | 0 refills | Status: DC | PRN

## 2016-11-11 NOTE — Progress Notes (Signed)
Subjective:   Patient ID: Melissa [name], female    DOB: 1970/11/16, 46 y.o.   MRN: [id number]  Melissa [name] is a pleasant 46 y.o. year old female who presents to clinic today with Medication Refill  on 11/11/2016  HPI:  Chronic pain syndrome-  Has been on Vicodin and neurontin for years.  UDS UTD. Has not had any violations of her pain contract.  She has been having more pain-" fibromyalgia type" pain since moving her mom out of her house.  She is also having more vaginal bleeding and wants to restart depo provera today.  Current Outpatient Prescriptions on File Prior to Visit  Medication Sig Dispense Refill  . clonazePAM (KLONOPIN) 1 MG tablet Take 1 tablet (1 mg total) by mouth 2 (two) times daily as needed. for anxiety 60 tablet 0  . diclofenac sodium (VOLTAREN) 1 % GEL Apply to affected area three to four times daily. 100 g 0  . fexofenadine (ALLEGRA) 180 MG tablet Take 180 mg by mouth daily.    Marland Kitchen gabapentin (NEURONTIN) 600 MG tablet TAKE ONE TABLET BY MOUTH THREE TIMES A DAY 90 tablet 5  . ondansetron (ZOFRAN-ODT) 4 MG disintegrating tablet Take 4 mg by mouth every 8 (eight) hours as needed for nausea or vomiting.     . ranitidine (ZANTAC) 150 MG tablet Take 150 mg by mouth daily.    Marland Kitchen tiZANidine (ZANAFLEX) 4 MG tablet TAKE 1 TABLET BY MOUTH EVERY 8 HOURS AS NEEDED FOR MUSCLE SPASMS 30 tablet 5  . vitamin B-12 (CYANOCOBALAMIN) 1000 MCG tablet Take 1,000 mcg by mouth daily.    . Vitamin D, Ergocalciferol, (DRISDOL) 50000 units CAPS capsule TAKE 1 CAPSULE BY MOUTH EVERY SEVEN DAYS 7 capsule 0   No current facility-administered medications on file prior to visit.     Allergies  Allergen Reactions  . Abilify [Aripiprazole] Other (See Comments)    Weight gain, did not help,   . Depakote [Divalproex Sodium] Other (See Comments)    Hair loss  . Erythromycin Base Swelling  . Lamotrigine Other (See Comments)    Weight gain, angioedema, hives  . Seroquel [Quetiapine Fumarate]  Other (See Comments)    irritable  . Tetanus Toxoids     Other reaction(s): OTHER  . Tramadol   . Ziprasidone Hcl Other (See Comments)    Heat/cold intolerance.   . Azithromycin Rash  . Lithium Rash  . Penicillins Rash    Has patient had a PCN reaction causing immediate rash, facial/tongue/throat swelling, SOB or lightheadedness with hypotension: Yes- swelling, rash  Has patient had a PCN reaction causing severe rash involving mucus membranes or skin necrosis: Yes Has patient had a PCN reaction that required hospitalization No Has patient had a PCN reaction occurring within the last 10 years: No If all of the above answers are "NO", then may proceed with Cephalosporin use.   . Pregabalin Palpitations    Manic symptoms, no sleep x's 3 days, no appetite.  . Sulfa Antibiotics Rash    Past Medical History:  Diagnosis Date  . Arthritis   . Bipolar disorder ([place])   . Fibromyalgia    on disability  . Migraine   . Muscle spasm   . Pain of right side of body    chronic  . Panic attack   . POTS (postural orthostatic tachycardia syndrome)   . Syncope    recurrent  . Uterine perforation    hx of    Past Surgical History:  Procedure Laterality Date  . CATARACT EXTRACTION  dec 2014  . DILATION AND CURETTAGE OF UTERUS    . LASIK  dec 2014    Family History  Problem Relation Age of Onset  . Diabetes Mother   . Hypertension Mother   . Cancer Father        brain tumor  . Cancer Maternal Grandmother   . Cancer Maternal Grandfather   . Cancer Paternal Grandmother   . Cancer Paternal Grandfather   . Migraines Neg Hx     Social History   Social History  . Marital status: Married    Spouse name: Izell [place]  . Number of children: 0  . Years of education: HS/college   Occupational History  .  Other    disabled   Social History Main Topics  . Smoking status: Never Smoker  . Smokeless tobacco: Never Used  . Alcohol use No     Comment: 1 holiday drink yearly  . Drug use:  No  . Sexual activity: Yes    Birth control/ protection: Injection   Other Topics Concern  . Not on file   Social History Narrative   Patient is married Izell [place]) and lives at home with her husband.   Married 20+ years   From [place]   On disability for fibromyalgia   Caffeine Use: none       The PMH, PSH, Social History, Family History, Medications, and allergies have been reviewed in [facility], and have been updated if relevant.   Review of Systems  Constitutional: Negative.   HENT: Negative.   Genitourinary: Positive for menstrual problem.  Musculoskeletal: Positive for arthralgias.  Neurological: Negative.   Hematological: Negative.   Psychiatric/Behavioral: Negative.   All other systems reviewed and are negative.      Objective:    BP 120/80   Pulse 80   Wt 157 lb (71.2 kg)   SpO2 98%   BMI 23.18 kg/m    Physical Exam  Constitutional: She is oriented to person, place, and time. She appears well-developed and well-nourished. No distress.  HENT:  Head: Normocephalic and atraumatic.  Eyes: Conjunctivae are normal.  Cardiovascular: Normal rate.   Pulmonary/Chest: Effort normal.  Musculoskeletal: Normal range of motion.  Neurological: She is alert and oriented to person, place, and time. No cranial nerve deficit.  Skin: Skin is warm and dry. She is not diaphoretic.  Psychiatric: She has a normal mood and affect. Her behavior is normal. Judgment and thought content normal.  Nursing note and vitals reviewed.         Assessment & Plan:   Chronic pain syndrome  Menorrhagia with irregular cycle No Follow-up on file.

## 2016-11-11 NOTE — Assessment & Plan Note (Signed)
Deteriorated with recent increased physical and emotional stressors. Vicodin rx printed and given to pt.  Date narcotic database last reviewed (include red flags): 11/11/16- no red flags.

## 2016-11-11 NOTE — Addendum Note (Signed)
Addended by: Inocencio Homes on: 11/11/2016 07:44 AM   Modules accepted: Orders

## 2016-11-11 NOTE — Assessment & Plan Note (Signed)
IM depo given today.

## 2016-11-11 NOTE — Progress Notes (Signed)
Pre visit review using our clinic review tool, if applicable. No additional management support is needed unless otherwise documented below in the visit note. 

## 2016-11-22 DIAGNOSIS — Z79899 Other long term (current) drug therapy: Secondary | ICD-10-CM | POA: Diagnosis not present

## 2016-11-22 DIAGNOSIS — F411 Generalized anxiety disorder: Secondary | ICD-10-CM | POA: Diagnosis not present

## 2016-11-22 DIAGNOSIS — F3132 Bipolar disorder, current episode depressed, moderate: Secondary | ICD-10-CM | POA: Diagnosis not present

## 2016-11-29 ENCOUNTER — Ambulatory Visit: Payer: PPO | Admitting: Family Medicine

## 2016-12-17 DIAGNOSIS — R875 Abnormal microbiological findings in specimens from female genital organs: Secondary | ICD-10-CM | POA: Diagnosis not present

## 2016-12-17 DIAGNOSIS — N926 Irregular menstruation, unspecified: Secondary | ICD-10-CM | POA: Diagnosis not present

## 2016-12-17 DIAGNOSIS — Z01419 Encounter for gynecological examination (general) (routine) without abnormal findings: Secondary | ICD-10-CM | POA: Diagnosis not present

## 2016-12-17 DIAGNOSIS — R232 Flushing: Secondary | ICD-10-CM | POA: Diagnosis not present

## 2016-12-17 DIAGNOSIS — Z1151 Encounter for screening for human papillomavirus (HPV): Secondary | ICD-10-CM | POA: Diagnosis not present

## 2017-01-24 DIAGNOSIS — Z79899 Other long term (current) drug therapy: Secondary | ICD-10-CM | POA: Diagnosis not present

## 2017-01-24 DIAGNOSIS — F314 Bipolar disorder, current episode depressed, severe, without psychotic features: Secondary | ICD-10-CM | POA: Diagnosis not present

## 2017-01-24 DIAGNOSIS — F411 Generalized anxiety disorder: Secondary | ICD-10-CM | POA: Diagnosis not present

## 2017-01-27 ENCOUNTER — Ambulatory Visit (INDEPENDENT_AMBULATORY_CARE_PROVIDER_SITE_OTHER): Payer: PPO | Admitting: Family Medicine

## 2017-01-27 ENCOUNTER — Encounter (INDEPENDENT_AMBULATORY_CARE_PROVIDER_SITE_OTHER): Payer: Self-pay

## 2017-01-27 ENCOUNTER — Encounter: Payer: Self-pay | Admitting: Family Medicine

## 2017-01-27 VITALS — BP 120/70 | HR 76 | Ht 69.0 in | Wt 152.0 lb

## 2017-01-27 DIAGNOSIS — M797 Fibromyalgia: Secondary | ICD-10-CM

## 2017-01-27 DIAGNOSIS — E538 Deficiency of other specified B group vitamins: Secondary | ICD-10-CM | POA: Diagnosis not present

## 2017-01-27 DIAGNOSIS — N921 Excessive and frequent menstruation with irregular cycle: Secondary | ICD-10-CM | POA: Diagnosis not present

## 2017-01-27 DIAGNOSIS — I951 Orthostatic hypotension: Secondary | ICD-10-CM

## 2017-01-27 DIAGNOSIS — R000 Tachycardia, unspecified: Secondary | ICD-10-CM | POA: Diagnosis not present

## 2017-01-27 DIAGNOSIS — G90A Postural orthostatic tachycardia syndrome (POTS): Secondary | ICD-10-CM

## 2017-01-27 LAB — CBC WITH DIFFERENTIAL/PLATELET
BASOS ABS: 0.1 10*3/uL (ref 0.0–0.1)
Basophils Relative: 1 % (ref 0.0–3.0)
EOS ABS: 0.1 10*3/uL (ref 0.0–0.7)
Eosinophils Relative: 1.5 % (ref 0.0–5.0)
HCT: 42 % (ref 36.0–46.0)
Hemoglobin: 13.8 g/dL (ref 12.0–15.0)
LYMPHS ABS: 1.5 10*3/uL (ref 0.7–4.0)
Lymphocytes Relative: 27.9 % (ref 12.0–46.0)
MCHC: 32.8 g/dL (ref 30.0–36.0)
MCV: 87.8 fl (ref 78.0–100.0)
MONOS PCT: 8.2 % (ref 3.0–12.0)
Monocytes Absolute: 0.4 10*3/uL (ref 0.1–1.0)
NEUTROS ABS: 3.3 10*3/uL (ref 1.4–7.7)
NEUTROS PCT: 61.4 % (ref 43.0–77.0)
PLATELETS: 255 10*3/uL (ref 150.0–400.0)
RBC: 4.79 Mil/uL (ref 3.87–5.11)
RDW: 13.6 % (ref 11.5–15.5)
WBC: 5.3 10*3/uL (ref 4.0–10.5)

## 2017-01-27 LAB — COMPREHENSIVE METABOLIC PANEL
ALK PHOS: 48 U/L (ref 39–117)
ALT: 10 U/L (ref 0–35)
AST: 13 U/L (ref 0–37)
Albumin: 4.1 g/dL (ref 3.5–5.2)
BILIRUBIN TOTAL: 0.4 mg/dL (ref 0.2–1.2)
BUN: 12 mg/dL (ref 6–23)
CO2: 28 meq/L (ref 19–32)
Calcium: 8.7 mg/dL (ref 8.4–10.5)
Chloride: 106 mEq/L (ref 96–112)
Creatinine, Ser: 1.06 mg/dL (ref 0.40–1.20)
GFR: 58.96 mL/min — AB (ref >60.00)
GLUCOSE: 82 mg/dL (ref 70–99)
Potassium: 4.2 mEq/L (ref 3.5–5.1)
Sodium: 139 mEq/L (ref 135–145)
TOTAL PROTEIN: 6.8 g/dL (ref 6.0–8.3)

## 2017-01-27 LAB — TSH: TSH: 0.79 u[IU]/mL (ref 0.35–4.50)

## 2017-01-27 LAB — VITAMIN B12: VITAMIN B 12: 683 pg/mL (ref 211–911)

## 2017-01-27 LAB — VITAMIN D 25 HYDROXY (VIT D DEFICIENCY, FRACTURES): VITD: 39.99 ng/mL (ref 30.00–100.00)

## 2017-01-27 MED ORDER — DICLOFENAC SODIUM 1 % TD GEL: TRANSDERMAL | 0 refills | Status: DC

## 2017-01-27 NOTE — Progress Notes (Signed)
Subjective:   Patient ID: Melissa [name], female    DOB: 1970-11-23, 46 y.o.   MRN: [id number]  Melissa [name] is a pleasant 46 y.o. year old female who presents to clinic today with Follow-up  on 01/27/2017  HPI:  Doing well.  Saw her GYN was and placed on loestrin to stop her vaginal bleeding.  No longer needing Depo, has not bled since.  Has been having more trouble with her POTs.  Had been on good control pushing fluids but in this heat, getting presyncopal more often.  Asking for lab work today.  Current Outpatient Prescriptions on File Prior to Visit  Medication Sig Dispense Refill  . clonazePAM (KLONOPIN) 1 MG tablet Take 1 tablet (1 mg total) by mouth 2 (two) times daily as needed. for anxiety 60 tablet 0  . fexofenadine (ALLEGRA) 180 MG tablet Take 180 mg by mouth daily.    Marland Kitchen gabapentin (NEURONTIN) 600 MG tablet TAKE ONE TABLET BY MOUTH THREE TIMES A DAY 90 tablet 5  . HYDROcodone-acetaminophen (NORCO) 5-325 MG tablet Take 1 tablet by mouth every 6 (six) hours as needed for moderate pain. 60 tablet 0  . ondansetron (ZOFRAN-ODT) 4 MG disintegrating tablet Take 4 mg by mouth every 8 (eight) hours as needed for nausea or vomiting.     . ranitidine (ZANTAC) 150 MG tablet Take 150 mg by mouth daily.    Marland Kitchen tiZANidine (ZANAFLEX) 4 MG tablet TAKE 1 TABLET BY MOUTH EVERY 8 HOURS AS NEEDED FOR MUSCLE SPASMS 30 tablet 5  . vitamin B-12 (CYANOCOBALAMIN) 1000 MCG tablet Take 1,000 mcg by mouth daily.    . Vitamin D, Ergocalciferol, (DRISDOL) 50000 units CAPS capsule TAKE 1 CAPSULE BY MOUTH EVERY SEVEN DAYS 7 capsule 0   No current facility-administered medications on file prior to visit.     Allergies  Allergen Reactions  . Abilify [Aripiprazole] Other (See Comments)    Weight gain, did not help,   . Depakote [Divalproex Sodium] Other (See Comments)    Hair loss  . Erythromycin Base Swelling  . Lamotrigine Other (See Comments)    Weight gain, angioedema, hives  . Seroquel [Quetiapine  Fumarate] Other (See Comments)    irritable  . Tetanus Toxoids     Other reaction(s): OTHER  . Tramadol   . Ziprasidone Hcl Other (See Comments)    Heat/cold intolerance.   . Azithromycin Rash  . Lithium Rash  . Penicillins Rash    Has patient had a PCN reaction causing immediate rash, facial/tongue/throat swelling, SOB or lightheadedness with hypotension: Yes- swelling, rash  Has patient had a PCN reaction causing severe rash involving mucus membranes or skin necrosis: Yes Has patient had a PCN reaction that required hospitalization No Has patient had a PCN reaction occurring within the last 10 years: No If all of the above answers are "NO", then may proceed with Cephalosporin use.   . Pregabalin Palpitations    Manic symptoms, no sleep x's 3 days, no appetite.  . Sulfa Antibiotics Rash    Past Medical History:  Diagnosis Date  . Arthritis   . Bipolar disorder ([place])   . Fibromyalgia    on disability  . Migraine   . Muscle spasm   . Pain of right side of body    chronic  . Panic attack   . POTS (postural orthostatic tachycardia syndrome)   . Syncope    recurrent  . Uterine perforation    hx of    Past Surgical  History:  Procedure Laterality Date  . CATARACT EXTRACTION  dec 2014  . DILATION AND CURETTAGE OF UTERUS    . LASIK  dec 2014    Family History  Problem Relation Age of Onset  . Diabetes Mother   . Hypertension Mother   . Cancer Father        brain tumor  . Cancer Maternal Grandmother   . Cancer Maternal Grandfather   . Cancer Paternal Grandmother   . Cancer Paternal Grandfather   . Migraines Neg Hx     Social History   Social History  . Marital status: Married    Spouse name: Melissa [name]  . Number of children: 0  . Years of education: HS/college   Occupational History  .  Other    disabled   Social History Main Topics  . Smoking status: Never Smoker  . Smokeless tobacco: Never Used  . Alcohol use No     Comment: 1 holiday drink yearly  .  Drug use: No  . Sexual activity: Yes    Birth control/ protection: Injection   Other Topics Concern  . Not on file   Social History Narrative   Patient is married Melissa [name]) and lives at home with her husband.   Married 20+ years   From [place]   On disability for fibromyalgia   Caffeine Use: none       The PMH, PSH, Social History, Family History, Medications, and allergies have been reviewed in [facility], and have been updated if relevant.   Review of Systems  Constitutional: Negative.   Genitourinary: Negative for menstrual problem.  Musculoskeletal: Positive for arthralgias.  Neurological: Positive for dizziness, syncope and light-headedness. Negative for tremors, seizures, facial asymmetry, speech difficulty, weakness, numbness and headaches.  All other systems reviewed and are negative.      Objective:    BP 120/70   Pulse 76   Ht 5\' 9"  (1.753 m)   Wt 152 lb (68.9 kg)   SpO2 99%   BMI 22.45 kg/m    Physical Exam  Constitutional: She is oriented to person, place, and time. She appears well-developed and well-nourished. No distress.  HENT:  Head: Normocephalic and atraumatic.  Eyes: Conjunctivae are normal.  Cardiovascular: Normal rate and regular rhythm.   Pulmonary/Chest: Effort normal and breath sounds normal.  Musculoskeletal: Normal range of motion. She exhibits no edema.  Neurological: She is alert and oriented to person, place, and time. No cranial nerve deficit.  Skin: Skin is warm and dry. She is not diaphoretic.  Psychiatric: She has a normal mood and affect. Her behavior is normal. Judgment and thought content normal.  Nursing note and vitals reviewed.         Assessment & Plan:   POTS (postural orthostatic tachycardia syndrome) - Plan: TSH, CBC with Differential/Platelet, Comprehensive metabolic panel  Vitamin S28 deficiency - Plan: Vitamin B12  Fibromyalgia - Plan: Vitamin D, 25-hydroxy No Follow-up on file.

## 2017-01-27 NOTE — Assessment & Plan Note (Signed)
Deteriorated. Normotensive today. Advised to push fluids, will check labs today.

## 2017-01-27 NOTE — Assessment & Plan Note (Signed)
Resolved. Followed by GYN- tolerating Loestrin well.

## 2017-02-22 ENCOUNTER — Other Ambulatory Visit: Payer: Self-pay | Admitting: Family Medicine

## 2017-02-22 NOTE — Telephone Encounter (Signed)
Rx request for gabapentin and Zanaflex Last OV 01/27/2017 Last refilled 07/20/2016 Next OV 05/02/2017

## 2017-02-28 DIAGNOSIS — F411 Generalized anxiety disorder: Secondary | ICD-10-CM | POA: Diagnosis not present

## 2017-02-28 DIAGNOSIS — Z79899 Other long term (current) drug therapy: Secondary | ICD-10-CM | POA: Diagnosis not present

## 2017-02-28 DIAGNOSIS — F314 Bipolar disorder, current episode depressed, severe, without psychotic features: Secondary | ICD-10-CM | POA: Diagnosis not present

## 2017-03-21 DIAGNOSIS — F3131 Bipolar disorder, current episode depressed, mild: Secondary | ICD-10-CM | POA: Diagnosis not present

## 2017-03-21 DIAGNOSIS — F411 Generalized anxiety disorder: Secondary | ICD-10-CM | POA: Diagnosis not present

## 2017-03-21 DIAGNOSIS — Z79899 Other long term (current) drug therapy: Secondary | ICD-10-CM | POA: Diagnosis not present

## 2017-03-24 ENCOUNTER — Encounter: Payer: Self-pay | Admitting: Family Medicine

## 2017-03-24 ENCOUNTER — Ambulatory Visit (INDEPENDENT_AMBULATORY_CARE_PROVIDER_SITE_OTHER): Payer: PPO | Admitting: Family Medicine

## 2017-03-24 ENCOUNTER — Encounter (INDEPENDENT_AMBULATORY_CARE_PROVIDER_SITE_OTHER): Payer: Self-pay

## 2017-03-24 DIAGNOSIS — R21 Rash and other nonspecific skin eruption: Secondary | ICD-10-CM | POA: Diagnosis not present

## 2017-03-24 NOTE — Patient Instructions (Signed)
Great to see you. Please keep me updated. 

## 2017-03-24 NOTE — Assessment & Plan Note (Signed)
Unclear etiology.  No consistent with shingles. ? Dermatitis vs reaction to lithium. Advised calling her psychiatrist in order to safely discontinue lithium. The patient indicates understanding of these issues and agrees with the plan. Call or return to clinic prn if these symptoms worsen or fail to improve as anticipated.

## 2017-03-24 NOTE — Progress Notes (Signed)
Subjective:   Patient ID: Melissa [name], female    DOB: 1971-04-29, 46 y.o.   MRN: [id number]  Melissa [name] is a pleasant 46 y.o. year old female who presents to clinic today with Rash (left hand )  on 03/24/2017  HPI:  Rash left hand- three blisters. Appeared yesterday.  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 No new products or cleaners. She wears rubber gloves while doing this.  Blisters are painful, not itchy.  Restarted lithium a couple of weeks ago (managed by psychiatry).  Current Outpatient Prescriptions on File Prior to Visit  Medication Sig Dispense Refill  . clonazePAM (KLONOPIN) 1 MG tablet Take 1 tablet (1 mg total) by mouth 2 (two) times daily as needed. for anxiety 60 tablet 0  . diclofenac sodium (VOLTAREN) 1 % GEL Apply to affected area three to four times daily. 100 g 0  . fexofenadine (ALLEGRA) 180 MG tablet Take 180 mg by mouth daily.    Marland Kitchen gabapentin (NEURONTIN) 600 MG tablet TAKE ONE TABLET BY MOUTH THREE TIMES A DAY 90 tablet 3  . HYDROcodone-acetaminophen (NORCO) 5-325 MG tablet Take 1 tablet by mouth every 6 (six) hours as needed for moderate pain. 60 tablet 0  . MICROGESTIN 1-20 MG-MCG tablet Take 1 tablet by mouth daily.     . ondansetron (ZOFRAN-ODT) 4 MG disintegrating tablet Take 4 mg by mouth every 8 (eight) hours as needed for nausea or vomiting.     . ranitidine (ZANTAC) 150 MG tablet Take 150 mg by mouth daily.    Marland Kitchen tiZANidine (ZANAFLEX) 4 MG tablet TAKE 1 TABLET BY MOUTH EVERY 8 HOURS AS NEEDED FOR MUSCLE SPASMS 30 tablet 3  . vitamin B-12 (CYANOCOBALAMIN) 1000 MCG tablet Take 1,000 mcg by mouth daily.    . Vitamin D, Ergocalciferol, (DRISDOL) 50000 units CAPS capsule TAKE 1 CAPSULE BY MOUTH EVERY SEVEN DAYS 7 capsule 0   No current facility-administered medications on file prior to visit.     Allergies  Allergen Reactions  . Abilify [Aripiprazole] Other (See Comments)    Weight  gain, did not help,   . Depakote [Divalproex Sodium] Other (See Comments)    Hair loss  . Erythromycin Base Swelling  . Lamotrigine Other (See Comments)    Weight gain, angioedema, hives  . Seroquel [Quetiapine Fumarate] Other (See Comments)    irritable  . Tetanus Toxoids     Other reaction(s): OTHER  . Tramadol   . Ziprasidone Hcl Other (See Comments)    Heat/cold intolerance.   . Azithromycin Rash  . Lithium Rash  . Penicillins Rash    Has patient had a PCN reaction causing immediate rash, facial/tongue/throat swelling, SOB or lightheadedness with hypotension: Yes- swelling, rash  Has patient had a PCN reaction causing severe rash involving mucus membranes or skin necrosis: Yes Has patient had a PCN reaction that required hospitalization No Has patient had a PCN reaction occurring within the last 10 years: No If all of the above answers are "NO", then may proceed with Cephalosporin use.   . Pregabalin Palpitations    Manic symptoms, no sleep x's 3 days, no appetite.  . Sulfa Antibiotics Rash    Past Medical History:  Diagnosis Date  . Arthritis   . Bipolar disorder ([place])   . Fibromyalgia    on disability  . Migraine   . Muscle spasm   . Pain of right side of body  chronic  . Panic attack   . POTS (postural orthostatic tachycardia syndrome)   . Syncope    recurrent  . Uterine perforation    hx of    Past Surgical History:  Procedure Laterality Date  . CATARACT EXTRACTION  dec 2014  . DILATION AND CURETTAGE OF UTERUS    . LASIK  dec 2014    Family History  Problem Relation Age of Onset  . Diabetes Mother   . Hypertension Mother   . Cancer Father        brain tumor  . Cancer Maternal Grandmother   . Cancer Maternal Grandfather   . Cancer Paternal Grandmother   . Cancer Paternal Grandfather   . Migraines Neg Hx     Social History   Social History  . Marital status: Married    Spouse name: Melissa [name]  . Number of children: 0  . Years of education:  HS/college   Occupational History  .  Other    disabled   Social History Main Topics  . Smoking status: Never Smoker  . Smokeless tobacco: Never Used  . Alcohol use No     Comment: 1 holiday drink yearly  . Drug use: No  . Sexual activity: Yes    Birth control/ protection: Injection   Other Topics Concern  . Not on file   Social History Narrative   Patient is married Melissa [place]) and lives at home with her husband.   Married 20+ years   From [place]   On disability for fibromyalgia   Caffeine Use: none       The PMH, PSH, Social History, Family History, Medications, and allergies have been reviewed in [facility], and have been updated if relevant.   Review of Systems  Skin: Positive for rash.  All other systems reviewed and are negative.      Objective:    BP 126/80 (BP Location: Left Arm, Patient Position: Sitting, Cuff Size: Normal)   Pulse 84   Temp 98.3 F (36.8 C) (Oral)   Wt 152 lb (68.9 kg)   LMP 03/07/2017   SpO2 99%   BMI 22.45 kg/m    Physical Exam  Constitutional: She is oriented to person, place, and time. She appears well-developed and well-nourished. No distress.  HENT:  Head: Normocephalic and atraumatic.  Eyes: Conjunctivae are normal.  Cardiovascular: Normal rate.   Pulmonary/Chest: Effort normal.  Neurological: She is alert and oriented to person, place, and time. No cranial nerve deficit.  Skin: She is not diaphoretic.  3 fluid filled blistered on left lateral thumb  Psychiatric: She has a normal mood and affect. Her behavior is normal. Judgment and thought content normal.  Nursing note and vitals reviewed.         Assessment & Plan:   Rash and nonspecific skin eruption No Follow-up on file.

## 2017-04-14 ENCOUNTER — Ambulatory Visit: Payer: Self-pay | Admitting: *Deleted

## 2017-04-14 NOTE — Telephone Encounter (Signed)
This encounter was created in error - please disregard.

## 2017-04-14 NOTE — Telephone Encounter (Signed)
  Reason for Disposition . [0] Systolic BP  >= 938 OR Diastolic >= 90 AND [1] not taking BP medications  Answer Assessment - Initial Assessment Questions 1. BLOOD PRESSURE: "What is the blood pressure?" "Did you take at least two measurements 5 minutes apart?" 161/98 standing and 122/78 lying down 2. ONSET: "When did you take your blood pressure?"     15 mins ago 3. HOW: "How did you obtain the blood pressure?" (e.g., visiting nurse, automatic home BP monitor)     Automatic home b/p monitor 4. HISTORY: "Do you have a history of high blood pressure?"     no 5. MEDICATIONS: "Are you taking any medications for blood pressure?" "Have you missed any doses recently?"     no 6. OTHER SYMPTOMS: "Do you have any symptoms?" (e.g., headache, chest pain, blurred vision, difficulty breathing, weakness)     Headache, shaking 7. PREGNANCY: "Is there any chance you are pregnant?" "When was your last menstrual period?"     No  On medications for birth control  Protocols used: HIGH BLOOD PRESSURE-A-AH  Pt states that she feels a little lightheaded at times and states that her b/p is up when she is up and about and drops when she is lying down. Pt instructed to check b/p lying, sitting and standing. And record these for her doctor to see at the office visit She stated that she could do this, but also if she starts having other symptoms like blurred vision, headache, weakness. Then she should go to the ED. Pt voiced understanding.

## 2017-04-15 DIAGNOSIS — R42 Dizziness and giddiness: Secondary | ICD-10-CM | POA: Diagnosis not present

## 2017-04-18 ENCOUNTER — Encounter: Payer: Self-pay | Admitting: Family Medicine

## 2017-04-18 ENCOUNTER — Ambulatory Visit (INDEPENDENT_AMBULATORY_CARE_PROVIDER_SITE_OTHER): Payer: PPO | Admitting: Family Medicine

## 2017-04-18 DIAGNOSIS — R42 Dizziness and giddiness: Secondary | ICD-10-CM | POA: Diagnosis not present

## 2017-04-18 DIAGNOSIS — H539 Unspecified visual disturbance: Secondary | ICD-10-CM

## 2017-04-18 NOTE — Patient Instructions (Signed)
Great to see you.  Let's stop Effexor.  Please keep me updated.

## 2017-04-18 NOTE — Assessment & Plan Note (Signed)
With visual changes and increased blood pressure- likely due to effexor.  Advised to d/c this (discussed wean although likely would be ok to d/c abruptly as she just started it),and to call her psychiatrist ASAP. Call or return to clinic prn if these symptoms worsen or fail to improve as anticipated. The patient indicates understanding of these issues and agrees with the plan.

## 2017-04-18 NOTE — Progress Notes (Signed)
Subjective:   Patient ID: Melissa [name], female    DOB: Nov 04, 1970, 46 y.o.   MRN: [id number]  Melissa [name] is a pleasant 46 y.o. year old female who presents to clinic today with Hypertension (Patient is here today C/O high BP since Friday.  Also has been experiencing dizziness and visual changes. Sometimes feels like she is going to pass out but has not.  She sits down when she feels this way.)  on 04/18/2017  HPI:  Over past few days, headache, blurred vision, increased blood pressure (for her)- 093G to 182X systolic. She typically has very low blood pressure- history of POTs.  Less than a week ago, d/c'd lithium and was started on Effexor.  She feels tired but no other symptoms.    Current Outpatient Prescriptions on File Prior to Visit  Medication Sig Dispense Refill  . clonazePAM (KLONOPIN) 1 MG tablet Take 1 tablet (1 mg total) by mouth 2 (two) times daily as needed. for anxiety 60 tablet 0  . diclofenac sodium (VOLTAREN) 1 % GEL Apply to affected area three to four times daily. 100 g 0  . fexofenadine (ALLEGRA) 180 MG tablet Take 180 mg by mouth daily.    Marland Kitchen gabapentin (NEURONTIN) 600 MG tablet TAKE ONE TABLET BY MOUTH THREE TIMES A DAY 90 tablet 3  . HYDROcodone-acetaminophen (NORCO) 5-325 MG tablet Take 1 tablet by mouth every 6 (six) hours as needed for moderate pain. 60 tablet 0  . ondansetron (ZOFRAN-ODT) 4 MG disintegrating tablet Take 4 mg by mouth every 8 (eight) hours as needed for nausea or vomiting.     Marland Kitchen PREVIFEM 0.25-35 MG-MCG tablet     . ranitidine (ZANTAC) 150 MG tablet Take 150 mg by mouth daily.    Marland Kitchen tiZANidine (ZANAFLEX) 4 MG tablet TAKE 1 TABLET BY MOUTH EVERY 8 HOURS AS NEEDED FOR MUSCLE SPASMS 30 tablet 3  . vitamin B-12 (CYANOCOBALAMIN) 1000 MCG tablet Take 1,000 mcg by mouth daily.    . Vitamin D, Ergocalciferol, (DRISDOL) 50000 units CAPS capsule TAKE 1 CAPSULE BY MOUTH EVERY SEVEN DAYS 7 capsule 0   No current facility-administered medications on  file prior to visit.     Allergies  Allergen Reactions  . Abilify [Aripiprazole] Other (See Comments)    Weight gain, did not help,   . Depakote [Divalproex Sodium] Other (See Comments)    Hair loss  . Erythromycin Base Swelling  . Lamotrigine Other (See Comments)    Weight gain, angioedema, hives  . Seroquel [Quetiapine Fumarate] Other (See Comments)    irritable  . Tetanus Toxoids     Other reaction(s): OTHER  . Tramadol   . Ziprasidone Hcl Other (See Comments)    Heat/cold intolerance.   . Azithromycin Rash  . Lithium Rash  . Penicillins Rash    Has patient had a PCN reaction causing immediate rash, facial/tongue/throat swelling, SOB or lightheadedness with hypotension: Yes- swelling, rash  Has patient had a PCN reaction causing severe rash involving mucus membranes or skin necrosis: Yes Has patient had a PCN reaction that required hospitalization No Has patient had a PCN reaction occurring within the last 10 years: No If all of the above answers are "NO", then may proceed with Cephalosporin use.   . Pregabalin Palpitations    Manic symptoms, no sleep x's 3 days, no appetite.  . Sulfa Antibiotics Rash    Past Medical History:  Diagnosis Date  . Arthritis   . Bipolar disorder ([place])   .  Fibromyalgia    on disability  . Migraine   . Muscle spasm   . Pain of right side of body    chronic  . Panic attack   . POTS (postural orthostatic tachycardia syndrome)   . Syncope    recurrent  . Uterine perforation    hx of    Past Surgical History:  Procedure Laterality Date  . CATARACT EXTRACTION  dec 2014  . DILATION AND CURETTAGE OF UTERUS    . LASIK  dec 2014    Family History  Problem Relation Age of Onset  . Diabetes Mother   . Hypertension Mother   . Cancer Father        brain tumor  . Cancer Maternal Grandmother   . Cancer Maternal Grandfather   . Cancer Paternal Grandmother   . Cancer Paternal Grandfather   . Migraines Neg Hx     Social History    Social History  . Marital status: Married    Spouse name: Izell [place]  . Number of children: 0  . Years of education: HS/college   Occupational History  .  Other    disabled   Social History Main Topics  . Smoking status: Never Smoker  . Smokeless tobacco: Never Used  . Alcohol use No     Comment: 1 holiday drink yearly  . Drug use: No  . Sexual activity: Yes    Birth control/ protection: Injection   Other Topics Concern  . Not on file   Social History Narrative   Patient is married Izell Rippe[place]) and lives at home with her husband.   Married 20+ years   From [place]   On disability for fibromyalgia   Caffeine Use: none       The PMH, PSH, Social History, Family History, Medications, and allergies have been reviewed in [facility], and have been updated if relevant.  Review of Systems  Constitutional: Positive for fatigue.  Eyes: Positive for photophobia.  Respiratory: Negative.   Cardiovascular: Negative.   Gastrointestinal: Negative.   Musculoskeletal: Negative.   Neurological: Positive for dizziness, light-headedness and headaches.  Psychiatric/Behavioral: Negative.   All other systems reviewed and are negative.      Objective:    BP (!) 142/72 (BP Location: Right Arm, Patient Position: Sitting, Cuff Size: Normal)   Pulse 94   Temp 97.8 F (36.6 C) (Oral)   Ht 5\' 9"  (1.753 m)   Wt 153 lb (69.4 kg)   LMP 03/17/2017   SpO2 98%   BMI 22.59 kg/m   BP Readings from Last 3 Encounters:  04/18/17 (!) 142/72  03/24/17 126/80  01/27/17 120/70    Physical Exam  Constitutional: She is oriented to person, place, and time. She appears well-developed and well-nourished. No distress.  HENT:  Head: Normocephalic and atraumatic.  Eyes: Conjunctivae are normal.  Cardiovascular: Normal rate.   Pulmonary/Chest: Effort normal.  Musculoskeletal: Normal range of motion. She exhibits no edema.  Neurological: She is alert and oriented to person, place, and time. No cranial nerve  deficit.  Skin: Skin is warm and dry. She is not diaphoretic.  Psychiatric: She has a normal mood and affect. Her behavior is normal. Judgment and thought content normal.  Nursing note and vitals reviewed.         Assessment & Plan:   Dizziness  Visual changes No Follow-up on file.

## 2017-04-19 ENCOUNTER — Encounter: Payer: Self-pay | Admitting: Family Medicine

## 2017-04-21 ENCOUNTER — Encounter: Payer: Self-pay | Admitting: Family Medicine

## 2017-04-25 ENCOUNTER — Ambulatory Visit: Payer: Self-pay

## 2017-04-25 DIAGNOSIS — Z79899 Other long term (current) drug therapy: Secondary | ICD-10-CM | POA: Diagnosis not present

## 2017-04-25 DIAGNOSIS — F3132 Bipolar disorder, current episode depressed, moderate: Secondary | ICD-10-CM | POA: Diagnosis not present

## 2017-04-25 NOTE — Telephone Encounter (Signed)
Pt. called and requested to discuss symptoms of continued elevated BP with nurse; reported she has appt. with PCP next Monday, 11/12, and questioned if she should be seen sooner.  Reported she has been driving all day, and started feeling very tired, and not focusing as well.  BP checked at 4:00 PM: 158/87; and rechecked @ approx. 5:00 PM: 159/89.  Also c/o a "slight headache, warm feeling, and dizziness if she is standing for more than 5 minutes.  Stated she has been having blurred vision since the BP elevated a week ago, and that Dr. Deborra Medina was aware of it.   Reported Dr. Deborra Medina d/c'd her Effexor last Monday, due to the elevated BP.  Care Advice reviewed with pt.  Appt. given at 7:15 AM with Dr. Deborra Medina.  Strongly advised to go to ER if symptoms worsen, or if BP is over 180/110 (per protocol care advice).  Encouraged to rest tonight. Pt. Verb. Understanding/ agrees with plan.     Reason for Disposition . Systolic BP  >= 287 OR Diastolic >= 867  Answer Assessment - Initial Assessment Questions 1. BLOOD PRESSURE: "What is the blood pressure?" "Did you take at least two measurements 5 minutes apart?"     158/87 approx. 4:00 PM, and then 159/89 about 1 hr. Later.  2. ONSET: "When did you take your blood pressure?"     4:00 pm and approx. 5:00 pm  3. HOW: "How did you obtain the blood pressure?" (e.g., visiting nurse, automatic home BP monitor)     Automatic machine 4. HISTORY: "Do you have a history of high blood pressure?"     No; one week ago, evaluated for it and was advised to d/c the Effexor Related hx. Of POTs syndrome and usual BP is 110/68. 5. MEDICATIONS: "Are you taking any medications for blood pressure?" "Have you missed any doses recently?"     Not on any blood pressure medications 6. OTHER SYMPTOMS: "Do you have any symptoms?" (e.g., headache, chest pain, blurred vision, difficulty breathing, weakness)     Blurred vision; slight headache, c/o dizziness with standing and moving about.  7.  PREGNANCY: "Is there any chance you are pregnant?" "When was your last menstrual period?"     No; on BC; LMP about 3 weeks ago  Protocols used: HIGH BLOOD PRESSURE-A-AH

## 2017-04-26 ENCOUNTER — Encounter: Payer: Self-pay | Admitting: Family Medicine

## 2017-04-26 ENCOUNTER — Ambulatory Visit: Payer: Self-pay

## 2017-04-26 ENCOUNTER — Ambulatory Visit: Payer: PPO | Admitting: Family Medicine

## 2017-04-26 ENCOUNTER — Telehealth: Payer: Self-pay | Admitting: Family Medicine

## 2017-04-26 VITALS — BP 148/82 | HR 89 | Temp 98.2°F | Ht 69.0 in | Wt 154.0 lb

## 2017-04-26 DIAGNOSIS — R030 Elevated blood-pressure reading, without diagnosis of hypertension: Secondary | ICD-10-CM | POA: Insufficient documentation

## 2017-04-26 DIAGNOSIS — F310 Bipolar disorder, current episode hypomanic: Secondary | ICD-10-CM | POA: Diagnosis not present

## 2017-04-26 MED ORDER — VERAPAMIL HCL 40 MG PO TABS: 40.0000 mg | ORAL_TABLET | Freq: Every day | ORAL | 0 refills | Status: DC

## 2017-04-26 MED ORDER — VERAPAMIL HCL 40 MG PO TABS: 40.0000 mg | ORAL_TABLET | Freq: Once | ORAL | 0 refills | Status: DC

## 2017-04-26 NOTE — Telephone Encounter (Signed)
rec'd call from the pharmacy re: need of clarification on pt's new Rx for Verpamil 40 mg.  Stated the Rx was written "Verapamil 40 mg, take 1 tab once for 1 dose by mouth."  Noted that the office note stated to start Verapamil 40 mg daily.  Advised the pharmacist that will contact the MD for clarification.

## 2017-04-26 NOTE — Patient Instructions (Signed)
Great to see you. We are starting Verapamil 40 mg daily.  Please come see me in 2 weeks.

## 2017-04-26 NOTE — Telephone Encounter (Signed)
Best number [phone]  Pt needs 2 week follow up from today  Please call her to schedule when dr Deborra Medina schedule is open

## 2017-04-26 NOTE — Progress Notes (Signed)
Subjective:   Patient ID: [name], female    DOB: July 02, 1970, 46 y.o.   MRN: [id number]  BABS DABBS is a pleasant 46 y.o. year old female who presents to clinic today with Dizziness (Patient is here today C/O intermittent dizziness.  She is experiencing increased BP and H/A's.  She says that she is not any better than when she was seen on 10.20.2018.)  on 04/26/2017  HPI:  Dizziness with elevated BP- when I initially saw her for this last week, we felt perhaps symptoms related to starting new SSRI, effexor.   Note reviewed from 04/18/17.  Advised to d/c this and to follow up with psychiatry.  She saw her psychiatrist yesterday who told her that effexor should be out of her system by now.  She did not start her on anything new but did suggest verapamil 240 mg three times to four times daily to help with mood stabilization and HTN.  [place] feels like she can't stop shaking.  Has a tremor and BP is high for her- still getting readings in 063K systolic.  Current Outpatient Medications on File Prior to Visit  Medication Sig Dispense Refill  . clonazePAM (KLONOPIN) 1 MG tablet Take 1 tablet (1 mg total) by mouth 2 (two) times daily as needed. for anxiety 60 tablet 0  . diclofenac sodium (VOLTAREN) 1 % GEL Apply to affected area three to four times daily. 100 g 0  . fexofenadine (ALLEGRA) 180 MG tablet Take 180 mg by mouth daily.    Marland Kitchen gabapentin (NEURONTIN) 600 MG tablet TAKE ONE TABLET BY MOUTH THREE TIMES A DAY 90 tablet 3  . HYDROcodone-acetaminophen (NORCO) 5-325 MG tablet Take 1 tablet by mouth every 6 (six) hours as needed for moderate pain. 60 tablet 0  . ondansetron (ZOFRAN-ODT) 4 MG disintegrating tablet Take 4 mg by mouth every 8 (eight) hours as needed for nausea or vomiting.     Marland Kitchen PREVIFEM 0.25-35 MG-MCG tablet     . ranitidine (ZANTAC) 150 MG tablet Take 150 mg by mouth daily.    Marland Kitchen tiZANidine (ZANAFLEX) 4 MG tablet TAKE 1 TABLET BY MOUTH EVERY 8 HOURS AS NEEDED FOR MUSCLE  SPASMS 30 tablet 3  . vitamin B-12 (CYANOCOBALAMIN) 1000 MCG tablet Take 1,000 mcg by mouth daily.    . Vitamin D, Ergocalciferol, (DRISDOL) 50000 units CAPS capsule TAKE 1 CAPSULE BY MOUTH EVERY SEVEN DAYS 7 capsule 0   No current facility-administered medications on file prior to visit.     Allergies  Allergen Reactions  . Abilify [Aripiprazole] Other (See Comments)    Weight gain, did not help,   . Depakote [Divalproex Sodium] Other (See Comments)    Hair loss  . Erythromycin Base Swelling  . Lamotrigine Other (See Comments)    Weight gain, angioedema, hives  . Seroquel [Quetiapine Fumarate] Other (See Comments)    irritable  . Tetanus Toxoids     Other reaction(s): OTHER  . Tramadol   . Ziprasidone Hcl Other (See Comments)    Heat/cold intolerance.   . Azithromycin Rash  . Lithium Rash  . Penicillins Rash    Has patient had a PCN reaction causing immediate rash, facial/tongue/throat swelling, SOB or lightheadedness with hypotension: Yes- swelling, rash  Has patient had a PCN reaction causing severe rash involving mucus membranes or skin necrosis: Yes Has patient had a PCN reaction that required hospitalization No Has patient had a PCN reaction occurring within the last 10 years: No If all of  the above answers are "NO", then may proceed with Cephalosporin use.   . Pregabalin Palpitations    Manic symptoms, no sleep x's 3 days, no appetite.  . Sulfa Antibiotics Rash    Past Medical History:  Diagnosis Date  . Arthritis   . Bipolar disorder ([place])   . Fibromyalgia    on disability  . Migraine   . Muscle spasm   . Pain of right side of body    chronic  . Panic attack   . POTS (postural orthostatic tachycardia syndrome)   . Syncope    recurrent  . Uterine perforation    hx of    Past Surgical History:  Procedure Laterality Date  . CATARACT EXTRACTION  dec 2014  . DILATION AND CURETTAGE OF UTERUS    . LASIK  dec 2014    Family History  Problem Relation Age  of Onset  . Diabetes Mother   . Hypertension Mother   . Cancer Father        brain tumor  . Cancer Maternal Grandmother   . Cancer Maternal Grandfather   . Cancer Paternal Grandmother   . Cancer Paternal Grandfather   . Migraines Neg Hx     Social History   Socioeconomic History  . Marital status: Married    Spouse name: Izell Anon Raices  . Number of children: 0  . Years of education: HS/college  . Highest education level: Not on file  Social Needs  . Financial resource strain: Not on file  . Food insecurity - worry: Not on file  . Food insecurity - inability: Not on file  . Transportation needs - medical: Not on file  . Transportation needs - non-medical: Not on file  Occupational History    Employer: OTHER    Comment: disabled  Tobacco Use  . Smoking status: Never Smoker  . Smokeless tobacco: Never Used  Substance and Sexual Activity  . Alcohol use: No    Alcohol/week: 0.0 oz    Comment: 1 holiday drink yearly  . Drug use: No  . Sexual activity: Yes    Birth control/protection: Injection  Other Topics Concern  . Not on file  Social History Narrative   Patient is married Izell [place]) and lives at home with her husband.   Married 20+ years   From [place]   On disability for fibromyalgia   Caffeine Use: none    The PMH, PSH, Social History, Family History, Medications, and allergies have been reviewed in [facility], and have been updated if relevant.   Review of Systems  Constitutional: Negative.   HENT: Negative.   Eyes: Negative.   Respiratory: Negative.   Cardiovascular: Negative.   Gastrointestinal: Negative.   Genitourinary: Negative.   Musculoskeletal: Negative.   Neurological: Positive for tremors and headaches. Negative for dizziness, seizures, syncope, facial asymmetry, speech difficulty, weakness, light-headedness and numbness.  All other systems reviewed and are negative.      Objective:    BP (!) 148/82 (BP Location: Right Arm, Patient Position: Sitting, Cuff  Size: Normal)   Pulse 89   Temp 98.2 F (36.8 C) (Oral)   Ht 5\' 9"  (1.753 m)   Wt 154 lb (69.9 kg)   LMP 03/29/2017   SpO2 98%   BMI 22.74 kg/m    Physical Exam  Constitutional: She is oriented to person, place, and time. She appears well-developed and well-nourished. No distress.  HENT:  Head: Normocephalic and atraumatic.  Eyes: Conjunctivae are normal.  Cardiovascular: Normal rate.  Pulmonary/Chest: Effort  normal.  Musculoskeletal: Normal range of motion.  Neurological: She is oriented to person, place, and time. She has normal strength. She displays tremor. No cranial nerve deficit or sensory deficit.  Skin: Skin is warm and dry. She is not diaphoretic.  Psychiatric: She has a normal mood and affect. Her behavior is normal. Judgment and thought content normal.  Nursing note and vitals reviewed.         Assessment & Plan:   Bipolar affective disorder, current episode hypomanic ([place])  Malaise and fatigue No Follow-up on file.

## 2017-04-26 NOTE — Telephone Encounter (Signed)
Sent in Verapamil with corrected SIG/thx dmf

## 2017-04-26 NOTE — Telephone Encounter (Signed)
Yes should read take 1 tablet by mouth daily. Thank you!

## 2017-04-26 NOTE — Progress Notes (Signed)
Subjective:   Patient ID: Melissa [name], female    DOB: [birth year], 46 y.o.   MRN: [id number]  Melissa [name] is a pleasant 46 y.o. year old female who presents to clinic today with Dizziness (Patient is here today C/O intermittent dizziness.  She is experiencing increased BP and H/A's.  She says that she is not any better than when she was seen on 10.20.2018.)  on 04/26/2017  HPI    Current Outpatient Medications on File Prior to Visit  Medication Sig Dispense Refill  . clonazePAM (KLONOPIN) 1 MG tablet Take 1 tablet (1 mg total) by mouth 2 (two) times daily as needed. for anxiety 60 tablet 0  . diclofenac sodium (VOLTAREN) 1 % GEL Apply to affected area three to four times daily. 100 g 0  . fexofenadine (ALLEGRA) 180 MG tablet Take 180 mg by mouth daily.    Marland Kitchen gabapentin (NEURONTIN) 600 MG tablet TAKE ONE TABLET BY MOUTH THREE TIMES A DAY 90 tablet 3  . HYDROcodone-acetaminophen (NORCO) 5-325 MG tablet Take 1 tablet by mouth every 6 (six) hours as needed for moderate pain. 60 tablet 0  . ondansetron (ZOFRAN-ODT) 4 MG disintegrating tablet Take 4 mg by mouth every 8 (eight) hours as needed for nausea or vomiting.     Marland Kitchen PREVIFEM 0.25-35 MG-MCG tablet     . ranitidine (ZANTAC) 150 MG tablet Take 150 mg by mouth daily.    Marland Kitchen tiZANidine (ZANAFLEX) 4 MG tablet TAKE 1 TABLET BY MOUTH EVERY 8 HOURS AS NEEDED FOR MUSCLE SPASMS 30 tablet 3  . vitamin B-12 (CYANOCOBALAMIN) 1000 MCG tablet Take 1,000 mcg by mouth daily.    . Vitamin D, Ergocalciferol, (DRISDOL) 50000 units CAPS capsule TAKE 1 CAPSULE BY MOUTH EVERY SEVEN DAYS 7 capsule 0   No current facility-administered medications on file prior to visit.     Allergies  Allergen Reactions  . Abilify [Aripiprazole] Other (See Comments)    Weight gain, did not help,   . Depakote [Divalproex Sodium] Other (See Comments)    Hair loss  . Erythromycin Base Swelling  . Lamotrigine Other (See Comments)    Weight gain, angioedema, hives  . Seroquel  [Quetiapine Fumarate] Other (See Comments)    irritable  . Tetanus Toxoids     Other reaction(s): OTHER  . Tramadol   . Ziprasidone Hcl Other (See Comments)    Heat/cold intolerance.   . Azithromycin Rash  . Lithium Rash  . Penicillins Rash    Has patient had a PCN reaction causing immediate rash, facial/tongue/throat swelling, SOB or lightheadedness with hypotension: Yes- swelling, rash  Has patient had a PCN reaction causing severe rash involving mucus membranes or skin necrosis: Yes Has patient had a PCN reaction that required hospitalization No Has patient had a PCN reaction occurring within the last 10 years: No If all of the above answers are "NO", then may proceed with Cephalosporin use.   . Pregabalin Palpitations    Manic symptoms, no sleep x's 3 days, no appetite.  . Sulfa Antibiotics Rash    Past Medical History:  Diagnosis Date  . Arthritis   . Bipolar disorder ([place])   . Fibromyalgia    on disability  . Migraine   . Muscle spasm   . Pain of right side of body    chronic  . Panic attack   . POTS (postural orthostatic tachycardia syndrome)   . Syncope    recurrent  . Uterine perforation    hx of  Past Surgical History:  Procedure Laterality Date  . CATARACT EXTRACTION  dec 2014  . DILATION AND CURETTAGE OF UTERUS    . LASIK  dec 2014    Family History  Problem Relation Age of Onset  . Diabetes Mother   . Hypertension Mother   . Cancer Father        brain tumor  . Cancer Maternal Grandmother   . Cancer Maternal Grandfather   . Cancer Paternal Grandmother   . Cancer Paternal Grandfather   . Migraines Neg Hx     Social History   Socioeconomic History  . Marital status: Married    Spouse name: Melissa [name]  . Number of children: 0  . Years of education: HS/college  . Highest education level: Not on file  Social Needs  . Financial resource strain: Not on file  . Food insecurity - worry: Not on file  . Food insecurity - inability: Not on file  .  Transportation needs - medical: Not on file  . Transportation needs - non-medical: Not on file  Occupational History    Employer: OTHER    Comment: disabled  Tobacco Use  . Smoking status: Never Smoker  . Smokeless tobacco: Never Used  Substance and Sexual Activity  . Alcohol use: No    Alcohol/week: 0.0 oz    Comment: 1 holiday drink yearly  . Drug use: No  . Sexual activity: Yes    Birth control/protection: Injection  Other Topics Concern  . Not on file  Social History Narrative   Patient is married Melissa [name]) and lives at home with her husband.   Married 20+ years   From [place]   On disability for fibromyalgia   Caffeine Use: none    The PMH, PSH, Social History, Family History, Medications, and allergies have been reviewed in [facility], and have been updated if relevant.   Review of Systems     Objective:    BP (!) 148/82 (BP Location: Right Arm, Patient Position: Sitting, Cuff Size: Normal)   Pulse 89   Temp 98.2 F (36.8 C) (Oral)   Ht 5\' 9"  (1.753 m)   Wt 154 lb (69.9 kg)   LMP 03/29/2017   SpO2 98%   BMI 22.74 kg/m    Physical Exam        Assessment & Plan:   No diagnosis found. No Follow-up on file.

## 2017-04-26 NOTE — Assessment & Plan Note (Signed)
>  25 minutes spent in face to face time with patient, >50% spent in counselling or coordination of care This is complicated by her history of POTs as well.  Explained to [place] that 240 mg of Verapamil three to four times daily makes we very nervous given her history of POTs.  Will try very low dose of Verapamil- 40 mg daily and she will continue to check her BP twice daily and follow up with me in 2 weeks. The patient indicates understanding of these issues and agrees with the plan.

## 2017-04-26 NOTE — Addendum Note (Signed)
Addended by: Marrion Coy on: 04/26/2017 03:43 PM   Modules accepted: Orders

## 2017-05-02 ENCOUNTER — Encounter: Payer: Self-pay | Admitting: Family Medicine

## 2017-05-02 ENCOUNTER — Ambulatory Visit: Payer: PPO | Admitting: Family Medicine

## 2017-05-09 ENCOUNTER — Other Ambulatory Visit: Payer: Self-pay

## 2017-05-09 ENCOUNTER — Ambulatory Visit (INDEPENDENT_AMBULATORY_CARE_PROVIDER_SITE_OTHER): Payer: PPO | Admitting: Family Medicine

## 2017-05-09 ENCOUNTER — Encounter (HOSPITAL_BASED_OUTPATIENT_CLINIC_OR_DEPARTMENT_OTHER): Payer: Self-pay | Admitting: *Deleted

## 2017-05-09 ENCOUNTER — Emergency Department (HOSPITAL_BASED_OUTPATIENT_CLINIC_OR_DEPARTMENT_OTHER)
Admission: EM | Admit: 2017-05-09 | Discharge: 2017-05-09 | Disposition: A | Discharge status: Home / Self Care | Payer: PPO | Attending: Emergency Medicine | Admitting: Emergency Medicine

## 2017-05-09 ENCOUNTER — Encounter: Payer: Self-pay | Admitting: Family Medicine

## 2017-05-09 ENCOUNTER — Emergency Department (HOSPITAL_BASED_OUTPATIENT_CLINIC_OR_DEPARTMENT_OTHER): Payer: PPO

## 2017-05-09 VITALS — BP 134/72 | HR 81 | Temp 98.2°F | Wt 155.0 lb

## 2017-05-09 DIAGNOSIS — I951 Orthostatic hypotension: Secondary | ICD-10-CM | POA: Diagnosis not present

## 2017-05-09 DIAGNOSIS — I10 Essential (primary) hypertension: Secondary | ICD-10-CM | POA: Diagnosis not present

## 2017-05-09 DIAGNOSIS — R519 Headache, unspecified: Secondary | ICD-10-CM

## 2017-05-09 DIAGNOSIS — Z79899 Other long term (current) drug therapy: Secondary | ICD-10-CM | POA: Diagnosis not present

## 2017-05-09 DIAGNOSIS — R000 Tachycardia, unspecified: Secondary | ICD-10-CM

## 2017-05-09 DIAGNOSIS — F310 Bipolar disorder, current episode hypomanic: Secondary | ICD-10-CM

## 2017-05-09 DIAGNOSIS — R51 Headache: Secondary | ICD-10-CM | POA: Insufficient documentation

## 2017-05-09 DIAGNOSIS — R42 Dizziness and giddiness: Secondary | ICD-10-CM

## 2017-05-09 DIAGNOSIS — G90A Postural orthostatic tachycardia syndrome (POTS): Secondary | ICD-10-CM

## 2017-05-09 LAB — CBC WITH DIFFERENTIAL/PLATELET
BASOS PCT: 0 %
Basophils Absolute: 0 10*3/uL (ref 0.0–0.1)
EOS ABS: 0.1 10*3/uL (ref 0.0–0.7)
Eosinophils Relative: 1 %
HEMATOCRIT: 42.2 % (ref 36.0–46.0)
HEMOGLOBIN: 14 g/dL (ref 12.0–15.0)
LYMPHS ABS: 1.9 10*3/uL (ref 0.7–4.0)
Lymphocytes Relative: 23 %
MCH: 28.5 pg (ref 26.0–34.0)
MCHC: 33.2 g/dL (ref 30.0–36.0)
MCV: 85.8 fL (ref 78.0–100.0)
Monocytes Absolute: 0.4 10*3/uL (ref 0.1–1.0)
Monocytes Relative: 5 %
NEUTROS ABS: 5.6 10*3/uL (ref 1.7–7.7)
NEUTROS PCT: 71 %
Platelets: 263 10*3/uL (ref 150–400)
RBC: 4.92 MIL/uL (ref 3.87–5.11)
RDW: 14.1 % (ref 11.5–15.5)
WBC: 8.1 10*3/uL (ref 4.0–10.5)

## 2017-05-09 LAB — COMPREHENSIVE METABOLIC PANEL
ALT: 11 U/L (ref 0–35)
AST: 15 U/L (ref 0–37)
Albumin: 4.2 g/dL (ref 3.5–5.2)
Alkaline Phosphatase: 52 U/L (ref 39–117)
BUN: 9 mg/dL (ref 6–23)
CHLORIDE: 107 meq/L (ref 96–112)
CO2: 26 meq/L (ref 19–32)
Calcium: 9.3 mg/dL (ref 8.4–10.5)
Creatinine, Ser: 0.93 mg/dL (ref 0.40–1.20)
GFR: 68.49 mL/min (ref >60.00)
GLUCOSE: 80 mg/dL (ref 70–99)
POTASSIUM: 5 meq/L (ref 3.5–5.1)
SODIUM: 139 meq/L (ref 135–145)
Total Bilirubin: 0.4 mg/dL (ref 0.2–1.2)
Total Protein: 7 g/dL (ref 6.0–8.3)

## 2017-05-09 LAB — BASIC METABOLIC PANEL
Anion gap: 6 (ref 5–15)
BUN: 9 mg/dL (ref 6–20)
CHLORIDE: 109 mmol/L (ref 101–111)
CO2: 23 mmol/L (ref 22–32)
Calcium: 8.7 mg/dL — ABNORMAL LOW (ref 8.9–10.3)
Creatinine, Ser: 0.87 mg/dL (ref 0.44–1.00)
GFR calc non Af Amer: >60 mL/min (ref >60)
Glucose, Bld: 85 mg/dL (ref 65–99)
POTASSIUM: 4 mmol/L (ref 3.5–5.1)
SODIUM: 138 mmol/L (ref 135–145)

## 2017-05-09 LAB — CBC
HEMATOCRIT: 44.2 % (ref 36.0–46.0)
HEMOGLOBIN: 14.5 g/dL (ref 12.0–15.0)
MCHC: 32.9 g/dL (ref 30.0–36.0)
MCV: 87.6 fl (ref 78.0–100.0)
PLATELETS: 252 10*3/uL (ref 150.0–400.0)
RBC: 5.05 Mil/uL (ref 3.87–5.11)
RDW: 14.4 % (ref 11.5–15.5)
WBC: 7.5 10*3/uL (ref 4.0–10.5)

## 2017-05-09 LAB — TSH: TSH: 0.98 u[IU]/mL (ref 0.35–4.50)

## 2017-05-09 MED ORDER — HYDROCODONE-ACETAMINOPHEN 5-325 MG PO TABS: 1.0000 | ORAL_TABLET | Freq: Four times a day (QID) | ORAL | 0 refills | Status: DC | PRN

## 2017-05-09 MED ORDER — VERAPAMIL HCL 40 MG PO TABS: 40.0000 mg | ORAL_TABLET | Freq: Two times a day (BID) | ORAL | 0 refills | Status: DC

## 2017-05-09 NOTE — ED Provider Notes (Signed)
[facility] EMERGENCY DEPARTMENT Provider Note   CSN: [id number] Arrival date & time: 05/09/17  8416     History   Chief Complaint Chief Complaint  Patient presents with  . Dizziness    HPI Melissa [name] is a 46 y.o. female.  HPI   The patient has a past medical history at POTS, migraines, fibromyalgia, bipolar disorder, recurrent syncope, panic attacks comes to the emergency department sent in by her primary care doctor for evaluation of hypertension, headaches, fatigue and syncopal episode.  She reports having syncopal episodes her whole life which led to the diagnosis of P OTS. She is seeing Dr. Caryl Comes with lumbar cardiology as well as a syncope specialist at [facility]. She's not seen them for a few years. She reports her blood pressure is typically around 606 systolic and that is normal for her but the past month it has been running systolic of 301S. This has been accompanied by constant blurry vision (requires glasses to see things far away) and a constant ache to the top of her head. Her PCP has seen her twice for this, therefor she was sent to the ER for further evaluation.   She was on Effexor for BiPolar disorder and taken off recently. She was started on 40 mg daily Verapamil, and today this was changed to 40 mg BID.  Denies frequent episodes of syncope, chest pain, abnormal bleeding, fevers, neck pain, back pain, weakness, severe headache, nausea, vomiting, diarrhea.  Past Medical History:  Diagnosis Date  . Arthritis   . Bipolar disorder ([place])   . Fibromyalgia    on disability  . Migraine   . Muscle spasm   . Pain of right side of body    chronic  . Panic attack   . POTS (postural orthostatic tachycardia syndrome)   . Syncope    recurrent  . Uterine perforation    hx of    Patient Active Problem List   Diagnosis Date Noted  . HTN (hypertension) 05/09/2017  . Transient elevated blood pressure 04/26/2017  . Dizziness 04/18/2017  . Visual changes  04/18/2017  . Menorrhagia 11/11/2016  . Alopecia 03/02/2016  . Bilateral cataracts 08/14/2015  . Acne 10/21/2014  . Liver lesion 09/09/2014  . Vitamin D deficiency 09/09/2014  . Hot flashes 06/10/2014  . Chronic pain syndrome 05/20/2014  . Vitamin B12 deficiency 03/12/2014  . Contraception management 09/20/2013  . IBS (irritable bowel syndrome) 09/20/2013  . Unspecified vitamin D deficiency 04/03/2013  . HLD (hyperlipidemia) 04/03/2013  . Osteopenia 04/03/2013  . POTS (postural orthostatic tachycardia syndrome) 02/26/2013  . Migraine 11/29/2012  . Allergic rhinitis 10/05/2012  . Mixed bipolar I disorder in remission ([place]) 10/05/2012  . Combined fat and carbohydrate induced hyperlipemia 10/05/2012  . Fibromyalgia   . Bipolar disorder ([place])   . CFIDS (chronic fatigue and immune dysfunction syndrome) ([place]) 09/10/2011  . Clinical depression 09/10/2011  . Acid reflux 09/10/2011  . Non-restorative sleep 09/10/2011  . Disturbance in sleep behavior 09/10/2011    Past Surgical History:  Procedure Laterality Date  . CATARACT EXTRACTION  dec 2014  . DILATION AND CURETTAGE OF UTERUS    . LASIK  dec 2014    OB History    No data available       Home Medications    Prior to Admission medications   Medication Sig Start Date End Date Taking? Authorizing Provider  clonazePAM (KLONOPIN) 1 MG tablet Take 1 tablet (1 mg total) by mouth 2 (two) times daily  as needed. for anxiety 05/26/16   Lucille Passy, MD  diclofenac sodium (VOLTAREN) 1 % GEL Apply to affected area three to four times daily. 01/27/17   Lucille Passy, MD  fexofenadine (ALLEGRA) 180 MG tablet Take 180 mg by mouth daily.    [provider]  gabapentin (NEURONTIN) 600 MG tablet TAKE ONE TABLET BY MOUTH THREE TIMES A DAY 02/22/17   Lucille Passy, MD  HYDROcodone-acetaminophen (NORCO) 5-325 MG tablet Take 1 tablet every 6 (six) hours as needed by mouth for moderate pain. 05/09/17   Lucille Passy, MD  ondansetron  (ZOFRAN-ODT) 4 MG disintegrating tablet Take 4 mg by mouth every 8 (eight) hours as needed for nausea or vomiting.     [provider]  PREVIFEM 0.25-35 MG-MCG tablet  03/11/17   [provider]  ranitidine (ZANTAC) 150 MG tablet Take 150 mg by mouth daily.    [provider]  tiZANidine (ZANAFLEX) 4 MG tablet TAKE 1 TABLET BY MOUTH EVERY 8 HOURS AS NEEDED FOR MUSCLE SPASMS 02/22/17   Lucille Passy, MD  verapamil (CALAN) 40 MG tablet Take 1 tablet (40 mg total) 2 (two) times daily by mouth. 05/09/17   Lucille Passy, MD  vitamin B-12 (CYANOCOBALAMIN) 1000 MCG tablet Take 1,000 mcg by mouth daily.    [provider]  Vitamin D, Ergocalciferol, (DRISDOL) 50000 units CAPS capsule TAKE 1 CAPSULE BY MOUTH EVERY SEVEN DAYS 07/20/16   Lucille Passy, MD    Family History Family History  Problem Relation Age of Onset  . Diabetes Mother   . Hypertension Mother   . Cancer Father        brain tumor  . Cancer Maternal Grandmother   . Cancer Maternal Grandfather   . Cancer Paternal Grandmother   . Cancer Paternal Grandfather   . Migraines Neg Hx     Social History Social History   Tobacco Use  . Smoking status: Never Smoker  . Smokeless tobacco: Never Used  Substance Use Topics  . Alcohol use: No    Alcohol/week: 0.0 oz    Comment: 1 holiday drink yearly  . Drug use: No     Allergies   Abilify [aripiprazole]; Depakote [divalproex sodium]; Erythromycin base; Lamotrigine; Seroquel [quetiapine fumarate]; Tetanus toxoids; Tramadol; Ziprasidone hcl; Azithromycin; Lithium; Penicillins; Pregabalin; and Sulfa antibiotics   Review of Systems Review of Systems Negative ROS aside from pertinent positives and negatives as listed in HPI   Physical Exam Updated Vital Signs BP 139/85 (BP Location: Right Arm)   Pulse 83   Temp 98.3 F (36.8 C) (Oral)   Resp 18   Ht 5\' 9"  (1.753 m)   Wt 68 kg (150 lb)   LMP 04/25/2017   SpO2 99%   BMI 22.15 kg/m   Physical  Exam   ED Treatments / Results  Labs (all labs ordered are listed, but only abnormal results are displayed) Labs Reviewed  BASIC METABOLIC PANEL - Abnormal; Notable for the following components:      Result Value   Calcium 8.7 (*)    All other components within normal limits  CBC WITH DIFFERENTIAL/PLATELET    EKG  EKG Interpretation None       Radiology Ct Head Wo Contrast  Result Date: 05/09/2017 CLINICAL DATA:  Headaches, blurred vision and dizziness for 1 month. EXAM: CT HEAD WITHOUT CONTRAST TECHNIQUE: Contiguous axial images were obtained from the base of the skull through the vertex without intravenous contrast. COMPARISON:  Head  CT scan 10/21/2014 FINDINGS: Brain: Appears normal without hemorrhage, infarct, mass lesion, mass effect, midline shift or abnormal extra-axial fluid collection. No hydrocephalus or pneumocephalus. Vascular: Negative. Skull: Intact. Sinuses/Orbits: Negative. Other: None. IMPRESSION: Negative head CT. Electronically Signed   By: Inge Rise M.D.   On: 05/09/2017 11:15    Procedures Procedures (including critical care time)  Medications Ordered in ED Medications - No data to display   Initial Impression / Assessment and Plan / ED Course  I have reviewed the triage vital signs and the nursing notes.  Pertinent labs & imaging results that were available during my care of the patient were reviewed by me and considered in my medical decision making (see chart for details).     Declines d-dimer or EKG. "d-dimer is always high, I don't have a blood clot" and she had an EKG a few weeks ago that was normal. Agreeable to the rest of her work-up. CBC unremarkable, her BMP showed a mildly lowered calcium.Normal head CT. BP is 134/72 today and she is not orthostatic. She reports that she will take calcium supplement at home for the next week. I recommended I call heart care and get her to be seen before the holidays. She reports that she has many many  doctors appointment coming up that are very important and that she may not be able to make the cardiology appointment that  I schedule. Therefore she says she will call the office when she leaves to be seen.  Final Clinical Impressions(s) / ED Diagnoses   Final diagnoses:  POTS (postural orthostatic tachycardia syndrome)  Nonintractable headache, unspecified chronicity pattern, unspecified headache type    ED Discharge Orders    None       Delos Haring, PA-C 05/09/17 1143    Pattricia Boss, MD 05/09/17 1530

## 2017-05-09 NOTE — ED Triage Notes (Addendum)
Pt reports one month of headache waxing and waning, seen by her pcp and rx htn meds 2 weeks ago, cont with ha, dizzyness and fatigue, syncopal episode yesterday, seen by her pcp again this am and told to come to ed for eval.

## 2017-05-09 NOTE — Patient Instructions (Signed)
Great to see you.  We are increasing your verapamil to 40 mg twice daily.  Please come see me in 2 weeks.

## 2017-05-09 NOTE — Progress Notes (Signed)
Subjective:   Patient ID: Melissa [name], female    DOB: 12-Aug-1970, 46 y.o.   MRN: [id number]  Melissa [name] is a pleasant 46 y.o. year old female who presents to clinic today with Follow-up (POTS)  on 05/09/2017  HPI:  Dizziness and elevated BP- initially saw her for this complaint on 04/18/17. Note reviewed. At that time, ? effexor was causing these symptoms.  She stopped taking it and followed up with her psychiatrist as directed.  I then saw her again on 04/26/17- note reviewed. At that time, still having elevated blood pressure readings off effexor. Her psychiatrist recommended verapamil 240 mg three times daily for mood stabilization and HTN. Given her history of POTs, I did not feel comfortable placing Melissa [name] on such a high dose of verapamil. I therefore started her on Verapamil 40 mg daily and advised her to follow up with me here today.  BPs have been improving but still running high, especially "for her."  Running in 354S systolic which makes her feel very dizzy.  Current Outpatient Medications on File Prior to Visit  Medication Sig Dispense Refill  . clonazePAM (KLONOPIN) 1 MG tablet Take 1 tablet (1 mg total) by mouth 2 (two) times daily as needed. for anxiety 60 tablet 0  . diclofenac sodium (VOLTAREN) 1 % GEL Apply to affected area three to four times daily. 100 g 0  . fexofenadine (ALLEGRA) 180 MG tablet Take 180 mg by mouth daily.    Marland Kitchen gabapentin (NEURONTIN) 600 MG tablet TAKE ONE TABLET BY MOUTH THREE TIMES A DAY 90 tablet 3  . HYDROcodone-acetaminophen (NORCO) 5-325 MG tablet Take 1 tablet by mouth every 6 (six) hours as needed for moderate pain. 60 tablet 0  . ondansetron (ZOFRAN-ODT) 4 MG disintegrating tablet Take 4 mg by mouth every 8 (eight) hours as needed for nausea or vomiting.     Marland Kitchen PREVIFEM 0.25-35 MG-MCG tablet     . ranitidine (ZANTAC) 150 MG tablet Take 150 mg by mouth daily.    Marland Kitchen tiZANidine (ZANAFLEX) 4 MG tablet TAKE 1 TABLET BY MOUTH EVERY 8 HOURS AS  NEEDED FOR MUSCLE SPASMS 30 tablet 3  . verapamil (CALAN) 40 MG tablet Take 1 tablet (40 mg total) daily by mouth. 30 tablet 0  . vitamin B-12 (CYANOCOBALAMIN) 1000 MCG tablet Take 1,000 mcg by mouth daily.    . Vitamin D, Ergocalciferol, (DRISDOL) 50000 units CAPS capsule TAKE 1 CAPSULE BY MOUTH EVERY SEVEN DAYS 7 capsule 0   No current facility-administered medications on file prior to visit.     Allergies  Allergen Reactions  . Abilify [Aripiprazole] Other (See Comments)    Weight gain, did not help,   . Depakote [Divalproex Sodium] Other (See Comments)    Hair loss  . Erythromycin Base Swelling  . Lamotrigine Other (See Comments)    Weight gain, angioedema, hives  . Seroquel [Quetiapine Fumarate] Other (See Comments)    irritable  . Tetanus Toxoids     Other reaction(s): OTHER  . Tramadol   . Ziprasidone Hcl Other (See Comments)    Heat/cold intolerance.   . Azithromycin Rash  . Lithium Rash  . Penicillins Rash    Has patient had a PCN reaction causing immediate rash, facial/tongue/throat swelling, SOB or lightheadedness with hypotension: Yes- swelling, rash  Has patient had a PCN reaction causing severe rash involving mucus membranes or skin necrosis: Yes Has patient had a PCN reaction that required hospitalization No Has patient had a PCN reaction  occurring within the last 10 years: No If all of the above answers are "NO", then may proceed with Cephalosporin use.   . Pregabalin Palpitations    Manic symptoms, no sleep x's 3 days, no appetite.  . Sulfa Antibiotics Rash    Past Medical History:  Diagnosis Date  . Arthritis   . Bipolar disorder ([place])   . Fibromyalgia    on disability  . Migraine   . Muscle spasm   . Pain of right side of body    chronic  . Panic attack   . POTS (postural orthostatic tachycardia syndrome)   . Syncope    recurrent  . Uterine perforation    hx of    Past Surgical History:  Procedure Laterality Date  . CATARACT EXTRACTION  dec  2014  . DILATION AND CURETTAGE OF UTERUS    . LASIK  dec 2014    Family History  Problem Relation Age of Onset  . Diabetes Mother   . Hypertension Mother   . Cancer Father        brain tumor  . Cancer Maternal Grandmother   . Cancer Maternal Grandfather   . Cancer Paternal Grandmother   . Cancer Paternal Grandfather   . Migraines Neg Hx     Social History   Socioeconomic History  . Marital status: Married    Spouse name: Izell [place]  . Number of children: 0  . Years of education: HS/college  . Highest education level: Not on file  Social Needs  . Financial resource strain: Not on file  . Food insecurity - worry: Not on file  . Food insecurity - inability: Not on file  . Transportation needs - medical: Not on file  . Transportation needs - non-medical: Not on file  Occupational History    Employer: OTHER    Comment: disabled  Tobacco Use  . Smoking status: Never Smoker  . Smokeless tobacco: Never Used  Substance and Sexual Activity  . Alcohol use: No    Alcohol/week: 0.0 oz    Comment: 1 holiday drink yearly  . Drug use: No  . Sexual activity: Yes    Birth control/protection: Injection  Other Topics Concern  . Not on file  Social History Narrative   Patient is married Izell ) and lives at home with her husband.   Married 20+ years   From [place]   On disability for fibromyalgia   Caffeine Use: none    The PMH, PSH, Social History, Family History, Medications, and allergies have been reviewed in [facility], and have been updated if relevant.   Review of Systems  Respiratory: Negative.   Cardiovascular: Negative.   Gastrointestinal: Negative.   Musculoskeletal: Negative.   Neurological: Positive for dizziness. Negative for tremors, seizures, facial asymmetry, weakness, light-headedness, numbness and headaches.  Hematological: Negative.   Psychiatric/Behavioral: Negative.   All other systems reviewed and are negative.      Objective:    BP 134/72 (BP Location:  Right Arm, Patient Position: Sitting, Cuff Size: Normal)   Pulse 81   Temp 98.2 F (36.8 C) (Oral)   Wt 155 lb (70.3 kg)   SpO2 99%   BMI 22.89 kg/m    Physical Exam  Constitutional: She is oriented to person, place, and time. She appears well-developed and well-nourished. No distress.  HENT:  Head: Normocephalic and atraumatic.  Eyes: Conjunctivae are normal.  Neck: Normal range of motion.  Cardiovascular: Normal rate and regular rhythm.  Pulmonary/Chest: Effort normal and breath sounds normal.  Musculoskeletal: Normal range of motion. She exhibits no edema.  Neurological: She is alert and oriented to person, place, and time. No cranial nerve deficit.  Skin: Skin is warm and dry. She is not diaphoretic.  Psychiatric: She has a normal mood and affect. Her behavior is normal. Judgment and thought content normal.  Nursing note and vitals reviewed.         Assessment & Plan:   Dizziness  POTS (postural orthostatic tachycardia syndrome)  Hypertension, unspecified type  Bipolar affective disorder, current episode hypomanic ([place]) No Follow-up on file.

## 2017-05-09 NOTE — Addendum Note (Signed)
Addended by: Lucille Passy on: 05/09/2017 09:10 AM   Modules accepted: Orders

## 2017-05-09 NOTE — Assessment & Plan Note (Signed)
Clinically normotensive here but remains symptomatically hypertensive. Increase verapamil to 40 mg twice daily, keep checking bp at home and follow up with me in 2 weeks. The patient indicates understanding of these issues and agrees with the plan.

## 2017-05-23 DIAGNOSIS — F3132 Bipolar disorder, current episode depressed, moderate: Secondary | ICD-10-CM | POA: Diagnosis not present

## 2017-05-26 ENCOUNTER — Encounter: Payer: Self-pay | Admitting: Family Medicine

## 2017-05-26 ENCOUNTER — Ambulatory Visit: Payer: PPO | Admitting: Family Medicine

## 2017-05-26 VITALS — BP 116/76 | HR 98 | Temp 98.3°F | Ht 69.0 in | Wt 149.8 lb

## 2017-05-26 DIAGNOSIS — F310 Bipolar disorder, current episode hypomanic: Secondary | ICD-10-CM | POA: Diagnosis not present

## 2017-05-26 DIAGNOSIS — R000 Tachycardia, unspecified: Secondary | ICD-10-CM

## 2017-05-26 DIAGNOSIS — G90A Postural orthostatic tachycardia syndrome (POTS): Secondary | ICD-10-CM

## 2017-05-26 DIAGNOSIS — I951 Orthostatic hypotension: Secondary | ICD-10-CM

## 2017-05-26 DIAGNOSIS — M797 Fibromyalgia: Secondary | ICD-10-CM | POA: Diagnosis not present

## 2017-05-26 MED ORDER — METHOCARBAMOL 500 MG PO TABS: 500.0000 mg | ORAL_TABLET | Freq: Four times a day (QID) | ORAL | 0 refills | Status: DC

## 2017-05-26 MED ORDER — ONDANSETRON 4 MG PO TBDP: 4.0000 mg | ORAL_TABLET | Freq: Three times a day (TID) | ORAL | 5 refills | Status: DC | PRN

## 2017-05-26 NOTE — Patient Instructions (Signed)
Happy Holidays!!!!

## 2017-05-26 NOTE — Progress Notes (Signed)
Subjective:   Patient ID: Melissa [name], female    DOB: 1971/05/13, 46 y.o.   MRN: [id number]  Melissa [name] is a pleasant 46 y.o. year old female who presents to clinic today with Follow-up (Patient is here today for a F/U.  She went to ED on 11.19.2018 and head CT was WNL.  Went to Pscyh who started her on Fluvoxamine 25mg  1qd and asked that her muscle relaxer Tizanidine be changed as when combined with new med can cause significant Systolic drop.)  on 26/02/4853  HPI:  Feeling much better.  Psychiatrist started her on Fluvoxamine 25 mg daily- has not picked this up yet.  She advised that she stop taking tizanadine and start taking robaxin for muscle relaxants due to possible drug interactions.  Dizziness and HA have resolved.  ER notes from 05/09/17 reviewed.  BP has been well controlled.   Current Outpatient Medications on File Prior to Visit  Medication Sig Dispense Refill  . clonazePAM (KLONOPIN) 1 MG tablet Take 1 tablet (1 mg total) by mouth 2 (two) times daily as needed. for anxiety 60 tablet 0  . diclofenac sodium (VOLTAREN) 1 % GEL Apply to affected area three to four times daily. 100 g 0  . fexofenadine (ALLEGRA) 180 MG tablet Take 180 mg by mouth daily.    . fluvoxaMINE (LUVOX) 50 MG tablet Take 50 tablets by mouth at bedtime.    . gabapentin (NEURONTIN) 600 MG tablet TAKE ONE TABLET BY MOUTH THREE TIMES A DAY 90 tablet 3  . HYDROcodone-acetaminophen (NORCO) 5-325 MG tablet Take 1 tablet every 6 (six) hours as needed by mouth for moderate pain. 60 tablet 0  . PREVIFEM 0.25-35 MG-MCG tablet     . ranitidine (ZANTAC) 150 MG tablet Take 150 mg by mouth daily.    . verapamil (CALAN) 40 MG tablet Take 1 tablet (40 mg total) 2 (two) times daily by mouth. 60 tablet 0  . vitamin B-12 (CYANOCOBALAMIN) 1000 MCG tablet Take 1,000 mcg by mouth daily.    . Vitamin D, Ergocalciferol, (DRISDOL) 50000 units CAPS capsule TAKE 1 CAPSULE BY MOUTH EVERY SEVEN DAYS 7 capsule 0   No  current facility-administered medications on file prior to visit.     Allergies  Allergen Reactions  . Abilify [Aripiprazole] Other (See Comments)    Weight gain, did not help,   . Depakote [Divalproex Sodium] Other (See Comments)    Hair loss  . Erythromycin Base Swelling  . Lamotrigine Other (See Comments)    Weight gain, angioedema, hives  . Seroquel [Quetiapine Fumarate] Other (See Comments)    irritable  . Tetanus Toxoids     Other reaction(s): OTHER  . Tramadol   . Ziprasidone Hcl Other (See Comments)    Heat/cold intolerance.   . Azithromycin Rash  . Lithium Rash  . Penicillins Rash    Has patient had a PCN reaction causing immediate rash, facial/tongue/throat swelling, SOB or lightheadedness with hypotension: Yes- swelling, rash  Has patient had a PCN reaction causing severe rash involving mucus membranes or skin necrosis: Yes Has patient had a PCN reaction that required hospitalization No Has patient had a PCN reaction occurring within the last 10 years: No If all of the above answers are "NO", then may proceed with Cephalosporin use.   . Pregabalin Palpitations    Manic symptoms, no sleep x's 3 days, no appetite.  . Sulfa Antibiotics Rash    Past Medical History:  Diagnosis Date  . Arthritis   .  Bipolar disorder ([place])   . Fibromyalgia    on disability  . Migraine   . Muscle spasm   . Pain of right side of body    chronic  . Panic attack   . POTS (postural orthostatic tachycardia syndrome)   . Syncope    recurrent  . Uterine perforation    hx of    Past Surgical History:  Procedure Laterality Date  . CATARACT EXTRACTION  dec 2014  . DILATION AND CURETTAGE OF UTERUS    . LASIK  dec 2014    Family History  Problem Relation Age of Onset  . Diabetes Mother   . Hypertension Mother   . Cancer Father        brain tumor  . Cancer Maternal Grandmother   . Cancer Maternal Grandfather   . Cancer Paternal Grandmother   . Cancer Paternal Grandfather   .  Migraines Neg Hx     Social History   Socioeconomic History  . Marital status: Married    Spouse name: [name]  . Number of children: 0  . Years of education: HS/college  . Highest education level: Not on file  Social Needs  . Financial resource strain: Not on file  . Food insecurity - worry: Not on file  . Food insecurity - inability: Not on file  . Transportation needs - medical: Not on file  . Transportation needs - non-medical: Not on file  Occupational History    Employer: OTHER    Comment: disabled  Tobacco Use  . Smoking status: Never Smoker  . Smokeless tobacco: Never Used  Substance and Sexual Activity  . Alcohol use: No    Alcohol/week: 0.0 oz    Comment: 1 holiday drink yearly  . Drug use: No  . Sexual activity: Yes    Birth control/protection: Injection  Other Topics Concern  . Not on file  Social History Narrative   Patient is married [name]) and lives at home with her husband.   Married 20+ years   From [place]   On disability for fibromyalgia   Caffeine Use: none    The PMH, PSH, Social History, Family History, Medications, and allergies have been reviewed in [facility], and have been updated if relevant.   Review of Systems  Constitutional: Negative.   Respiratory: Negative.   Cardiovascular: Negative.   Musculoskeletal: Negative.   Neurological: Negative.   Hematological: Negative.   Psychiatric/Behavioral: Negative.   All other systems reviewed and are negative.      Objective:    BP 116/76 (BP Location: Left Arm, Patient Position: Sitting, Cuff Size: Normal)   Pulse 98   Temp 98.3 F (36.8 C) (Oral)   Ht 5\' 9"  (1.753 m)   Wt 149 lb 12.8 oz (67.9 kg)   LMP 05/19/2017   SpO2 99%   BMI 22.12 kg/m    Physical Exam    General:  Well-developed,well-nourished,in no acute distress; alert,appropriate and cooperative throughout examination Head:  normocephalic and atraumatic.   Eyes:  vision grossly intact, PERRL Ears:  R ear normal and L ear  normal externally, TMs clear bilaterally Nose:  no external deformity.   Mouth:  good dentition.   Lungs:  Normal respiratory effort, chest expands symmetrically. Lungs are clear to auscultation, no crackles or wheezes. Heart:  Normal rate and regular rhythm. S1 and S2 normal without gallop, murmur, click, rub or other extra sounds. Msk:  No deformity or scoliosis noted of thoracic or lumbar spine.   Extremities:  No  clubbing, cyanosis, edema, or deformity noted with normal full range of motion of all joints.   Neurologic:  alert & oriented X3 and gait normal.   Skin:  Intact without suspicious lesions or rashes Cervical Nodes:  No lymphadenopathy noted Axillary Nodes:  No palpable lymphadenopathy Psych:  Cognition and judgment appear intact. Alert and cooperative with normal attention span and concentration. No apparent delusions, illusions, hallucinations      Assessment & Plan:   POTS (postural orthostatic tachycardia syndrome)  Bipolar affective disorder, current episode hypomanic ([place]) No Follow-up on file.

## 2017-05-26 NOTE — Assessment & Plan Note (Signed)
Currently doing well from at POTs standpoint.

## 2017-05-26 NOTE — Assessment & Plan Note (Signed)
Followed by psychiatry 

## 2017-05-26 NOTE — Assessment & Plan Note (Signed)
Still needing muscle relaxants most nights after work. D/c tizanidine due to possible rx reactions. Flexeril has not been helpful for her in the past. eRx sent for robaxin. Call or return to clinic prn if these symptoms worsen or fail to improve as anticipated. The patient indicates understanding of these issues and agrees with the plan.

## 2017-06-09 ENCOUNTER — Other Ambulatory Visit: Payer: Self-pay | Admitting: Family Medicine

## 2017-06-16 ENCOUNTER — Ambulatory Visit: Payer: PPO | Admitting: Internal Medicine

## 2017-06-16 VITALS — BP 144/78 | HR 77 | Ht 69.0 in | Wt 154.0 lb

## 2017-06-16 DIAGNOSIS — I951 Orthostatic hypotension: Secondary | ICD-10-CM | POA: Diagnosis not present

## 2017-06-16 DIAGNOSIS — G90A Postural orthostatic tachycardia syndrome (POTS): Secondary | ICD-10-CM

## 2017-06-16 DIAGNOSIS — R42 Dizziness and giddiness: Secondary | ICD-10-CM

## 2017-06-16 DIAGNOSIS — R000 Tachycardia, unspecified: Secondary | ICD-10-CM

## 2017-06-16 MED ORDER — VERAPAMIL HCL ER 120 MG PO TBCR: 120.0000 mg | EXTENDED_RELEASE_TABLET | Freq: Every day | ORAL | 4 refills | Status: DC

## 2017-06-16 NOTE — Patient Instructions (Signed)
Medication Instructions: Your physician has recommended you make the following change in your medication:  -1) INCREASE Verapamil 120 mg - Take 1 tablet by mouth daily - NEW RX SENT  Labwork: None  Procedures/Testing: None  Follow-Up: Your physician recommends that you schedule a follow-up appointment as needed with Dr. Caryl Comes. Please follow up with your Primary Care Physician for medication refills.   If you need a refill on your cardiac medications before your next appointment, please call your pharmacy.

## 2017-06-16 NOTE — Progress Notes (Signed)
ELECTROPHYSIOLOGY CONSULT NOTE  Patient ID: Melissa [name], MRN: [id number], DOB/AGE: 06/29/1970 46 y.o. Admit date: (Not on file) Date of Consult: 06/16/2017  Primary Physician: Lucille Passy, MD Primary Cardiologist: Analisa Sledd is a 46 y.o. female who is being seen today for the evaluation of Hypertension at the request of Dr Deborra Medina.    HPI Melissa [name] is a 46 y.o. female  with a complex history of bipolar disease, fibromyalgia, recurrent syncope and an unusual form of POTS which was associated with post tachycardic vasodilatation. She is followed at [facility].  Review of charts as demonstrated no objective abnormalities and orthostatic vital signs over the last couple of years.  These notes were reviewed.  She has been managed with salt water.  Most recently, she was noted to be significantly hypertensive.  Salt was decreased and low-dose verapamil was initiated.  Hypertension is been associated with headaches and dizziness and some impaired vision.  Blood pressures more recently have been much improved.  She has a significant sleep disorder.  She sleeps on a couple of hours a night.  She has significant fatigue and "brain fog "  She is followed by psychiatry for her bipolar disease with apparently quite good attention to the broad nature of her diagnoses.    Past Medical History:  Diagnosis Date  . Arthritis   . Bipolar disorder ([place])   . Fibromyalgia    on disability  . Migraine   . Muscle spasm   . Pain of right side of body    chronic  . Panic attack   . POTS (postural orthostatic tachycardia syndrome)   . Syncope    recurrent  . Uterine perforation    hx of      Surgical History:  Past Surgical History:  Procedure Laterality Date  . CATARACT EXTRACTION  dec 2014  . DILATION AND CURETTAGE OF UTERUS    . LASIK  dec 2014     Home Meds: Prior to Admission medications   Medication Sig Start Date End Date Taking? Authorizing Provider    clonazePAM (KLONOPIN) 1 MG tablet Take 1 tablet (1 mg total) by mouth 2 (two) times daily as needed. for anxiety 05/26/16  Yes Lucille Passy, MD  diclofenac sodium (VOLTAREN) 1 % GEL Apply to affected area three to four times daily. 01/27/17  Yes Lucille Passy, MD  fexofenadine (ALLEGRA) 180 MG tablet Take 180 mg by mouth daily.   Yes [provider]  fluvoxaMINE (LUVOX) 50 MG tablet Take 50 tablets by mouth at bedtime. 05/23/17  Yes [provider]  gabapentin (NEURONTIN) 600 MG tablet TAKE ONE TABLET BY MOUTH THREE TIMES A DAY 02/22/17  Yes Lucille Passy, MD  HYDROcodone-acetaminophen (NORCO) 5-325 MG tablet Take 1 tablet every 6 (six) hours as needed by mouth for moderate pain. 05/09/17  Yes Lucille Passy, MD  methocarbamol (ROBAXIN) 500 MG tablet Take 1 tablet (500 mg total) by mouth 4 (four) times daily. 05/26/17  Yes Lucille Passy, MD  ondansetron (ZOFRAN-ODT) 4 MG disintegrating tablet Take 1 tablet (4 mg total) by mouth every 8 (eight) hours as needed for nausea or vomiting. 05/26/17  Yes Lucille Passy, MD  PREVIFEM 0.25-35 MG-MCG tablet  03/11/17  Yes [provider]  ranitidine (ZANTAC) 150 MG tablet Take 150 mg by mouth daily.   Yes [provider]  verapamil (CALAN) 40 MG tablet TAKE 1 TABLET (40  MG TOTAL) BY MOUTH 2 TIMES DAILY 06/09/17  Yes Lucille Passy, MD  vitamin B-12 (CYANOCOBALAMIN) 1000 MCG tablet Take 1,000 mcg by mouth daily.   Yes [provider]  Vitamin D, Ergocalciferol, (DRISDOL) 50000 units CAPS capsule TAKE 1 CAPSULE BY MOUTH EVERY SEVEN DAYS 07/20/16  Yes Lucille Passy, MD    Allergies:  Allergies  Allergen Reactions  . Abilify [Aripiprazole] Other (See Comments)    Weight gain, did not help,   . Depakote [Divalproex Sodium] Other (See Comments)    Hair loss  . Erythromycin Base Swelling  . Lamotrigine Other (See Comments)    Weight gain, angioedema, hives  . Seroquel [Quetiapine Fumarate] Other (See Comments)    irritable   . Tetanus Toxoids     Other reaction(s): OTHER  . Tramadol   . Ziprasidone Hcl Other (See Comments)    Heat/cold intolerance.   . Azithromycin Rash  . Lithium Rash  . Penicillins Rash    Has patient had a PCN reaction causing immediate rash, facial/tongue/throat swelling, SOB or lightheadedness with hypotension: Yes- swelling, rash  Has patient had a PCN reaction causing severe rash involving mucus membranes or skin necrosis: Yes Has patient had a PCN reaction that required hospitalization No Has patient had a PCN reaction occurring within the last 10 years: No If all of the above answers are "NO", then may proceed with Cephalosporin use.   . Pregabalin Palpitations    Manic symptoms, no sleep x's 3 days, no appetite.  . Sulfa Antibiotics Rash    Social History   Socioeconomic History  . Marital status: Married    Spouse name: [name]  . Number of children: 0  . Years of education: HS/college  . Highest education level: Not on file  Social Needs  . Financial resource strain: Not on file  . Food insecurity - worry: Not on file  . Food insecurity - inability: Not on file  . Transportation needs - medical: Not on file  . Transportation needs - non-medical: Not on file  Occupational History    Employer: OTHER    Comment: disabled  Tobacco Use  . Smoking status: Never Smoker  . Smokeless tobacco: Never Used  Substance and Sexual Activity  . Alcohol use: No    Alcohol/week: 0.0 oz    Comment: 1 holiday drink yearly  . Drug use: No  . Sexual activity: Yes    Birth control/protection: Injection  Other Topics Concern  . Not on file  Social History Narrative   Patient is married [name]) and lives at home with her husband.   Married 20+ years   From [place]   On disability for fibromyalgia   Caffeine Use: none      Family History  Problem Relation Age of Onset  . Diabetes Mother   . Hypertension Mother   . Cancer Father        brain tumor  . Cancer Maternal  Grandmother   . Cancer Maternal Grandfather   . Cancer Paternal Grandmother   . Cancer Paternal Grandfather   . Migraines Neg Hx      ROS:  Please see the history of present illness.     All other systems reviewed and negative.    Physical Exam: Blood pressure (!) 144/78, pulse 77, height 5\' 9"  (1.753 m), weight 154 lb (69.9 kg), last menstrual period 05/19/2017, SpO2 97 %. General: Well developed, well nourished female in no acute distress. Head: Normocephalic, atraumatic, sclera non-icteric, no xanthomas,  nares are without discharge. EENT: normal  Lymph Nodes:  none Neck: Negative for carotid bruits. JVD not elevated. Back:without scoliosis kyphosis  Lungs: Clear bilaterally to auscultation without wheezes, rales, or rhonchi. Breathing is unlabored. Heart: RRR with S1 S2. No murmur . No rubs, or gallops appreciated. Abdomen: Soft, non-tender, non-distended with normoactive bowel sounds. No hepatomegaly. No rebound/guarding. No obvious abdominal masses. Msk:  Strength and tone appear normal for age. Extremities: No clubbing or cyanosis. No edema.  Distal pedal pulses are 2+ and equal bilaterally. Skin: Warm and Dry Neuro: Alert and oriented X 3. CN III-XII intact Grossly normal sensory and motor function . Psych:  Responds to questions appropriately with a normal affect.      Labs: Cardiac Enzymes No results for input(s): CKTOTAL, CKMB, TROPONINI in the last 72 hours. CBC Lab Results  Component Value Date   WBC 8.1 05/09/2017   HGB 14.0 05/09/2017   HCT 42.2 05/09/2017   MCV 85.8 05/09/2017   PLT 263 05/09/2017   PROTIME: No results for input(s): LABPROT, INR in the last 72 hours. Chemistry No results for input(s): NA, K, CL, CO2, BUN, CREATININE, CALCIUM, PROT, BILITOT, ALKPHOS, ALT, AST, GLUCOSE in the last 168 hours.  Invalid input(s): LABALBU Lipids Lab Results  Component Value Date   CHOL 245 (H) 06/24/2016   HDL 40.20 06/24/2016   LDLCALC 114 (H) 02/04/2012     TRIG 363.0 (H) 06/24/2016   BNP No results found for: PROBNP Thyroid Function Tests: No results for input(s): TSH, T4TOTAL, T3FREE, THYROIDAB in the last 72 hours.  Invalid input(s): FREET3 Miscellaneous Lab Results  Component Value Date   DDIMER 0.54 (H) 11/03/2015    Radiology/Studies:  No results found.  EKG: Sinus at 77 Intervals 14/07/39 Axis 90   Assessment and Plan:  POTS  Bipolar disorder  Sleep disturbance  Hypertension  The patient carries a diagnosis of POTS but is noted above there has been no objective measurements of orthostatic instability that I can find over the last few years.  This raises a couple of concerns related to the accuracy of the diagnosis and will defer that to her primary physicians at [facility].  I do wonder whether her significant sleep disturbance does not contribute significantly to her debility and I suggested that she perhaps get a sleep consultation with Dr. Maxwell Caul  Her blood pressures are improved following the discontinuation of her supplemental sodium.   She is tolerating the verapamil well and I suggested that we increase it from 40 twice daily--120 daily with 40 being used additionally PRN  Is not seem to aggravate her other symptoms.   Virl Axe

## 2017-06-17 NOTE — Addendum Note (Signed)
Addended by: Claude Manges on: 06/17/2017 10:30 AM   Modules accepted: Orders

## 2017-06-22 ENCOUNTER — Other Ambulatory Visit: Payer: Self-pay | Admitting: Family Medicine

## 2017-06-24 ENCOUNTER — Other Ambulatory Visit: Payer: Self-pay | Admitting: Family Medicine

## 2017-06-24 ENCOUNTER — Other Ambulatory Visit: Payer: Self-pay

## 2017-06-24 MED ORDER — GABAPENTIN 600 MG PO TABS: 600.0000 mg | ORAL_TABLET | Freq: Three times a day (TID) | ORAL | 3 refills | Status: DC

## 2017-07-18 ENCOUNTER — Ambulatory Visit (INDEPENDENT_AMBULATORY_CARE_PROVIDER_SITE_OTHER): Payer: PPO | Admitting: Family Medicine

## 2017-07-18 VITALS — BP 132/76 | HR 97 | Temp 99.4°F | Ht 69.0 in | Wt 156.2 lb

## 2017-07-18 DIAGNOSIS — J069 Acute upper respiratory infection, unspecified: Secondary | ICD-10-CM

## 2017-07-18 LAB — POCT INFLUENZA A/B
INFLUENZA A, POC: NEGATIVE
Influenza B, POC: NEGATIVE

## 2017-07-18 NOTE — Progress Notes (Signed)
SUBJECTIVE:  Melissa [name] is a 47 y.o. female who present complaining of flu-like symptoms: fevers, chills, myalgias, congestion, sore throat and cough for 3 days. Denies dyspnea or wheezing.  Current Outpatient Medications on File Prior to Visit  Medication Sig Dispense Refill  . clonazePAM (KLONOPIN) 1 MG tablet Take 1 tablet (1 mg total) by mouth 2 (two) times daily as needed. for anxiety 60 tablet 0  . diclofenac sodium (VOLTAREN) 1 % GEL Apply to affected area three to four times daily. 100 g 0  . fexofenadine (ALLEGRA) 180 MG tablet Take 180 mg by mouth daily.    . fluvoxaMINE (LUVOX) 50 MG tablet Take 100 mg by mouth at bedtime.     . gabapentin (NEURONTIN) 600 MG tablet TAKE ONE TABLET BY MOUTH THREE TIMES A DAY 90 tablet 3  . gabapentin (NEURONTIN) 600 MG tablet Take 1 tablet (600 mg total) by mouth 3 (three) times daily. 90 tablet 3  . HYDROcodone-acetaminophen (NORCO) 5-325 MG tablet Take 1 tablet every 6 (six) hours as needed by mouth for moderate pain. 60 tablet 0  . methocarbamol (ROBAXIN) 500 MG tablet Take 1 tablet (500 mg total) by mouth 4 (four) times daily. 60 tablet 0  . ondansetron (ZOFRAN-ODT) 4 MG disintegrating tablet Take 1 tablet (4 mg total) by mouth every 8 (eight) hours as needed for nausea or vomiting. 20 tablet 5  . PREVIFEM 0.25-35 MG-MCG tablet     . ranitidine (ZANTAC) 150 MG tablet Take 150 mg by mouth daily.    . verapamil (CALAN-SR) 120 MG CR tablet Take 1 tablet (120 mg total) by mouth at bedtime. 30 tablet 4  . vitamin B-12 (CYANOCOBALAMIN) 1000 MCG tablet Take 1,000 mcg by mouth daily.    . Vitamin D, Ergocalciferol, (DRISDOL) 50000 units CAPS capsule TAKE 1 CAPSULE BY MOUTH EVERY SEVEN DAYS 7 capsule 0   No current facility-administered medications on file prior to visit.     Allergies  Allergen Reactions  . Abilify [Aripiprazole] Other (See Comments)    Weight gain, did not help,   . Depakote [Divalproex Sodium] Other (See Comments)    Hair loss   . Erythromycin Base Swelling  . Lamotrigine Other (See Comments)    Weight gain, angioedema, hives  . Seroquel [Quetiapine Fumarate] Other (See Comments)    irritable  . Tetanus Toxoids     Other reaction(s): OTHER  . Tramadol   . Ziprasidone Hcl Other (See Comments)    Heat/cold intolerance.   . Azithromycin Rash  . Lithium Rash  . Penicillins Rash    Has patient had a PCN reaction causing immediate rash, facial/tongue/throat swelling, SOB or lightheadedness with hypotension: Yes- swelling, rash  Has patient had a PCN reaction causing severe rash involving mucus membranes or skin necrosis: Yes Has patient had a PCN reaction that required hospitalization No Has patient had a PCN reaction occurring within the last 10 years: No If all of the above answers are "NO", then may proceed with Cephalosporin use.   . Pregabalin Palpitations    Manic symptoms, no sleep x's 3 days, no appetite.  . Sulfa Antibiotics Rash    Past Medical History:  Diagnosis Date  . Arthritis   . Bipolar disorder ([place])   . Fibromyalgia    on disability  . Migraine   . Muscle spasm   . Pain of right side of body    chronic  . Panic attack   . POTS (postural orthostatic tachycardia syndrome)   .  Syncope    recurrent  . Uterine perforation    hx of    Past Surgical History:  Procedure Laterality Date  . CATARACT EXTRACTION  dec 2014  . DILATION AND CURETTAGE OF UTERUS    . LASIK  dec 2014    Family History  Problem Relation Age of Onset  . Diabetes Mother   . Hypertension Mother   . Cancer Father        brain tumor  . Cancer Maternal Grandmother   . Cancer Maternal Grandfather   . Cancer Paternal Grandmother   . Cancer Paternal Grandfather   . Migraines Neg Hx     Social History   Socioeconomic History  . Marital status: Married    Spouse name: Izell Letcher  . Number of children: 0  . Years of education: HS/college  . Highest education level: Not on file  Social Needs  . Financial  resource strain: Not on file  . Food insecurity - worry: Not on file  . Food insecurity - inability: Not on file  . Transportation needs - medical: Not on file  . Transportation needs - non-medical: Not on file  Occupational History    Employer: OTHER    Comment: disabled  Tobacco Use  . Smoking status: Never Smoker  . Smokeless tobacco: Never Used  Substance and Sexual Activity  . Alcohol use: No    Alcohol/week: 0.0 oz    Comment: 1 holiday drink yearly  . Drug use: No  . Sexual activity: Yes    Birth control/protection: Injection  Other Topics Concern  . Not on file  Social History Narrative   Patient is married Izell [place]) and lives at home with her husband.   Married 20+ years   From [place]   On disability for fibromyalgia   Caffeine Use: none    The PMH, PSH, Social History, Family History, Medications, and allergies have been reviewed in [facility], and have been updated if relevant.  OBJECTIVE: BP 132/76 (BP Location: Right Arm, Cuff Size: Normal)   Pulse 97   Temp 99.4 F (37.4 C) (Oral)   Ht 5\' 9"  (1.753 m)   Wt 156 lb 3.2 oz (70.9 kg)   SpO2 93%   BMI 23.07 kg/m   Appears moderately ill but not toxic; temperature as noted in vitals. Ears normal. Throat and pharynx normal.  Neck supple. No adenopathy in the neck. Sinuses non tender. The chest is clear.  ASSESSMENT: Upper respiratory infection, likely viral Rapid flu neg  PLAN: Symptomatic therapy suggested: rest, increase fluids and call prn if symptoms persist or worsen. Call or return to clinic prn if these symptoms worsen or fail to improve as anticipated.

## 2017-07-18 NOTE — Addendum Note (Signed)
Addended by: Kathlen Brunswick on: 07/18/2017 09:36 AM   Modules accepted: Orders

## 2017-08-08 ENCOUNTER — Other Ambulatory Visit: Payer: Self-pay | Admitting: Family Medicine

## 2017-08-08 MED ORDER — CYCLOBENZAPRINE HCL 5 MG PO TABS: 5.0000 mg | ORAL_TABLET | Freq: Three times a day (TID) | ORAL | 1 refills | Status: DC | PRN

## 2017-08-25 ENCOUNTER — Encounter: Payer: Self-pay | Admitting: Family Medicine

## 2017-08-25 ENCOUNTER — Ambulatory Visit (INDEPENDENT_AMBULATORY_CARE_PROVIDER_SITE_OTHER): Payer: PPO | Admitting: Family Medicine

## 2017-08-25 VITALS — BP 120/60 | HR 84 | Temp 99.0°F | Ht 69.0 in | Wt 154.2 lb

## 2017-08-25 DIAGNOSIS — F310 Bipolar disorder, current episode hypomanic: Secondary | ICD-10-CM | POA: Diagnosis not present

## 2017-08-25 DIAGNOSIS — I951 Orthostatic hypotension: Secondary | ICD-10-CM | POA: Diagnosis not present

## 2017-08-25 DIAGNOSIS — R000 Tachycardia, unspecified: Secondary | ICD-10-CM | POA: Diagnosis not present

## 2017-08-25 DIAGNOSIS — M797 Fibromyalgia: Secondary | ICD-10-CM

## 2017-08-25 DIAGNOSIS — G90A Postural orthostatic tachycardia syndrome (POTS): Secondary | ICD-10-CM

## 2017-08-25 DIAGNOSIS — F3170 Bipolar disorder, currently in remission, most recent episode unspecified: Secondary | ICD-10-CM | POA: Diagnosis not present

## 2017-08-25 NOTE — Progress Notes (Signed)
Subjective:   Patient ID: Melissa [name], female    DOB: 1970/08/22, 47 y.o.   MRN: [id number]  Melissa [name] is a pleasant 47 y.o. year old female who presents to clinic today with Follow-up (Patient is here today to F/U with POTS.  She feels that she has been doing well most days.  Only a couple of days she has had dizzy spells.  Dr. Jens Som increased her Verapamil and she feels that may have helped.)  on 08/25/2017  HPI:  POTS- saw Dr. Caryl Comes on 12/27.18. Note reviewed. He increased her verapamil.  She feels she has been doing well.    Bipolar disorder- Has appointment coming up with new psychiatrist tomorrow. Feels she has been doing well.  Current Outpatient Medications on File Prior to Visit  Medication Sig Dispense Refill  . clonazePAM (KLONOPIN) 1 MG tablet Take 1 tablet (1 mg total) by mouth 2 (two) times daily as needed. for anxiety 60 tablet 0  . cyclobenzaprine (FLEXERIL) 5 MG tablet Take 1 tablet (5 mg total) by mouth 3 (three) times daily as needed for muscle spasms. 30 tablet 1  . diclofenac sodium (VOLTAREN) 1 % GEL Apply to affected area three to four times daily. 100 g 0  . fexofenadine (ALLEGRA) 180 MG tablet Take 180 mg by mouth daily.    . fluvoxaMINE (LUVOX) 50 MG tablet Take 100 mg by mouth at bedtime.     . gabapentin (NEURONTIN) 600 MG tablet Take 1 tablet (600 mg total) by mouth 3 (three) times daily. 90 tablet 3  . HYDROcodone-acetaminophen (NORCO) 5-325 MG tablet Take 1 tablet every 6 (six) hours as needed by mouth for moderate pain. 60 tablet 0  . ondansetron (ZOFRAN-ODT) 4 MG disintegrating tablet Take 1 tablet (4 mg total) by mouth every 8 (eight) hours as needed for nausea or vomiting. 20 tablet 5  . PREVIFEM 0.25-35 MG-MCG tablet     . ranitidine (ZANTAC) 150 MG tablet Take 150 mg by mouth daily.    . verapamil (CALAN-SR) 120 MG CR tablet Take 1 tablet (120 mg total) by mouth at bedtime. 30 tablet 4  . vitamin B-12 (CYANOCOBALAMIN) 1000 MCG tablet Take 1,000  mcg by mouth daily.     No current facility-administered medications on file prior to visit.     Allergies  Allergen Reactions  . Abilify [Aripiprazole] Other (See Comments)    Weight gain, did not help,   . Depakote [Divalproex Sodium] Other (See Comments)    Hair loss  . Erythromycin Base Swelling  . Lamotrigine Other (See Comments)    Weight gain, angioedema, hives  . Seroquel [Quetiapine Fumarate] Other (See Comments)    irritable  . Tetanus Toxoids     Other reaction(s): OTHER  . Tramadol   . Ziprasidone Hcl Other (See Comments)    Heat/cold intolerance.   . Azithromycin Rash  . Lithium Rash  . Penicillins Rash    Has patient had a PCN reaction causing immediate rash, facial/tongue/throat swelling, SOB or lightheadedness with hypotension: Yes- swelling, rash  Has patient had a PCN reaction causing severe rash involving mucus membranes or skin necrosis: Yes Has patient had a PCN reaction that required hospitalization No Has patient had a PCN reaction occurring within the last 10 years: No If all of the above answers are "NO", then may proceed with Cephalosporin use.   . Pregabalin Palpitations    Manic symptoms, no sleep x's 3 days, no appetite.  . Sulfa Antibiotics Rash  Past Medical History:  Diagnosis Date  . Arthritis   . Bipolar disorder ([place])   . Fibromyalgia    on disability  . Migraine   . Muscle spasm   . Pain of right side of body    chronic  . Panic attack   . POTS (postural orthostatic tachycardia syndrome)   . Syncope    recurrent  . Uterine perforation    hx of    Past Surgical History:  Procedure Laterality Date  . CATARACT EXTRACTION  dec 2014  . DILATION AND CURETTAGE OF UTERUS    . LASIK  dec 2014    Family History  Problem Relation Age of Onset  . Diabetes Mother   . Hypertension Mother   . Cancer Father        brain tumor  . Cancer Maternal Grandmother   . Cancer Maternal Grandfather   . Cancer Paternal Grandmother   .  Cancer Paternal Grandfather   . Migraines Neg Hx     Social History   Socioeconomic History  . Marital status: Married    Spouse name: Izell [place]  . Number of children: 0  . Years of education: HS/college  . Highest education level: Not on file  Social Needs  . Financial resource strain: Not on file  . Food insecurity - worry: Not on file  . Food insecurity - inability: Not on file  . Transportation needs - medical: Not on file  . Transportation needs - non-medical: Not on file  Occupational History    Employer: OTHER    Comment: disabled  Tobacco Use  . Smoking status: Never Smoker  . Smokeless tobacco: Never Used  Substance and Sexual Activity  . Alcohol use: No    Alcohol/week: 0.0 oz    Comment: 1 holiday drink yearly  . Drug use: No  . Sexual activity: Yes    Birth control/protection: Injection  Other Topics Concern  . Not on file  Social History Narrative   Patient is married Izell [place]) and lives at home with her husband.   Married 20+ years   From [place]   On disability for fibromyalgia   Caffeine Use: none    The PMH, PSH, Social History, Family History, Medications, and allergies have been reviewed in [facility], and have been updated if relevant.  Review of Systems  Constitutional: Negative.   HENT: Negative.   Eyes: Negative.   Respiratory: Negative.   Cardiovascular: Negative.   Gastrointestinal: Negative.   Endocrine: Negative.   Genitourinary: Negative.   Musculoskeletal: Negative.   Skin: Negative.   Allergic/Immunologic: Negative.   Neurological: Negative.   Hematological: Negative.   Psychiatric/Behavioral: Negative.   All other systems reviewed and are negative.      Objective:    BP 120/60 (BP Location: Left Arm, Patient Position: Sitting, Cuff Size: Normal)   Pulse 84   Temp 99 F (37.2 C) (Oral)   Ht 5\' 9"  (1.753 m)   Wt 154 lb 3.2 oz (69.9 kg)   LMP 08/13/2017   SpO2 98%   BMI 22.77 kg/m   Wt Readings from Last 3 Encounters:  08/25/17  154 lb 3.2 oz (69.9 kg)  07/18/17 156 lb 3.2 oz (70.9 kg)  06/16/17 154 lb (69.9 kg)     Physical Exam   General:  Well-developed,well-nourished,in no acute distress; alert,appropriate and cooperative throughout examination Head:  normocephalic and atraumatic.   Eyes:  vision grossly intact, PERRL Ears:  R ear normal and L ear normal externally, TMs  clear bilaterally Nose:  no external deformity.   Mouth:  good dentition.   Neck:  No deformities, masses, or tenderness noted. Lungs:  Normal respiratory effort, chest expands symmetrically. Lungs are clear to auscultation, no crackles or wheezes. Heart:  Normal rate and regular rhythm. S1 and S2 normal without gallop, murmur, click, rub or other extra sounds. Msk:  No deformity or scoliosis noted of thoracic or lumbar spine.   Extremities:  No clubbing, cyanosis, edema, or deformity noted with normal full range of motion of all joints.   Neurologic:  alert & oriented X3 and gait normal.   Skin:  Intact without suspicious lesions or rashes Cervical Nodes:  No lymphadenopathy noted Axillary Nodes:  No palpable lymphadenopathy Psych:  Cognition and judgment appear intact. Alert and cooperative with normal attention span and concentration. No apparent delusions, illusions, hallucinations       Assessment & Plan:   No diagnosis found. No Follow-up on file.

## 2017-08-25 NOTE — Assessment & Plan Note (Signed)
Doing well on increased dose of Verapamil. No changes made.

## 2017-08-25 NOTE — Assessment & Plan Note (Signed)
In remission. Has appointment with new psychiatrist tomorrow. No changes made.

## 2017-08-26 DIAGNOSIS — F3132 Bipolar disorder, current episode depressed, moderate: Secondary | ICD-10-CM | POA: Diagnosis not present

## 2017-09-07 ENCOUNTER — Ambulatory Visit: Payer: Self-pay

## 2017-09-07 NOTE — Telephone Encounter (Signed)
Patient called for c/o "dizziness." She says "I have POTS syndrome and I am usually dehydrated when I have the dizziness and feeling like I will pass out. But, I have been drinking plenty of fluids and I still feel this way. I have been working the past two days and today I had to sit down a few times because I felt like I was going to pass out. The room is not spinning." I asked about the severity, she said "I guess moderate, since I have been working." I asked about anything making it worse, she says "nothing that I can tell." I asked about other symptoms, she says "just feeling out of breath." According to protocol, see PCP within 3 days, appointment made for tomorrow at 0900 with Dr. Deborra Medina, care advice given, patient verbalized understanding.   Reason for Disposition . [1] MODERATE dizziness (e.g., interferes with normal activities) AND [2] has been evaluated by physician for this  Answer Assessment - Initial Assessment Questions 1. DESCRIPTION: "Describe your dizziness."     It feels like I will pass out 2. LIGHTHEADED: "Do you feel lightheaded?" (e.g., somewhat faint, woozy, weak upon standing)     Lightheaded 3. VERTIGO: "Do you feel like either you or the room is spinning or tilting?" (i.e. vertigo)     No 4. SEVERITY: "How bad is it?"  "Do you feel like you are going to faint?" "Can you stand and walk?"   - MILD - walking normally   - MODERATE - interferes with normal activities (e.g., work, school)    - SEVERE - unable to stand, requires support to walk, feels like passing out now.      Moderate 5. ONSET:  "When did the dizziness begin?"     Yesterday 6. AGGRAVATING FACTORS: "Does anything make it worse?" (e.g., standing, change in head position)     I don't really know 7. HEART RATE: "Can you tell me your heart rate?" "How many beats in 15 seconds?"  (Note: not all patients can do this)       High 80's last night; BP 100/61 then 138/82 last night 8. CAUSE: "What do you think is  causing the dizziness?"     I don't know 9. RECURRENT SYMPTOM: "Have you had dizziness before?" If so, ask: "When was the last time?" "What happened that time?"     Yes 10. OTHER SYMPTOMS: "Do you have any other symptoms?" (e.g., fever, chest pain, vomiting, diarrhea, bleeding)       Just feel out of breath 11. PREGNANCY: "Is there any chance you are pregnant?" "When was your last menstrual period?"       No  Protocols used: DIZZINESS [facility]

## 2017-09-08 ENCOUNTER — Encounter: Payer: Self-pay | Admitting: Family Medicine

## 2017-09-08 ENCOUNTER — Ambulatory Visit (INDEPENDENT_AMBULATORY_CARE_PROVIDER_SITE_OTHER): Payer: PPO | Admitting: Family Medicine

## 2017-09-08 ENCOUNTER — Emergency Department (HOSPITAL_COMMUNITY)
Admission: EM | Admit: 2017-09-08 | Discharge: 2017-09-08 | Disposition: A | Discharge status: Home / Self Care | Payer: PPO | Attending: Emergency Medicine | Admitting: Emergency Medicine

## 2017-09-08 ENCOUNTER — Telehealth: Payer: Self-pay | Admitting: Family Medicine

## 2017-09-08 VITALS — BP 118/72 | HR 99 | Temp 98.5°F | Ht 69.0 in | Wt 158.6 lb

## 2017-09-08 DIAGNOSIS — G43909 Migraine, unspecified, not intractable, without status migrainosus: Secondary | ICD-10-CM | POA: Diagnosis not present

## 2017-09-08 DIAGNOSIS — R319 Hematuria, unspecified: Secondary | ICD-10-CM

## 2017-09-08 DIAGNOSIS — F319 Bipolar disorder, unspecified: Secondary | ICD-10-CM | POA: Diagnosis not present

## 2017-09-08 DIAGNOSIS — R55 Syncope and collapse: Secondary | ICD-10-CM

## 2017-09-08 DIAGNOSIS — R404 Transient alteration of awareness: Secondary | ICD-10-CM | POA: Diagnosis not present

## 2017-09-08 DIAGNOSIS — R51 Headache: Secondary | ICD-10-CM | POA: Diagnosis not present

## 2017-09-08 DIAGNOSIS — I10 Essential (primary) hypertension: Secondary | ICD-10-CM | POA: Diagnosis not present

## 2017-09-08 DIAGNOSIS — R519 Headache, unspecified: Secondary | ICD-10-CM

## 2017-09-08 DIAGNOSIS — Z79899 Other long term (current) drug therapy: Secondary | ICD-10-CM | POA: Diagnosis not present

## 2017-09-08 DIAGNOSIS — R200 Anesthesia of skin: Secondary | ICD-10-CM | POA: Diagnosis not present

## 2017-09-08 LAB — POCT URINALYSIS DIPSTICK
Bilirubin, UA: NEGATIVE
Glucose, UA: NEGATIVE
KETONES UA: NEGATIVE
Leukocytes, UA: NEGATIVE
Nitrite, UA: NEGATIVE
PH UA: 6 (ref 5.0–8.0)
Protein, UA: NEGATIVE
Spec Grav, UA: 1.01 (ref 1.010–1.025)
UROBILINOGEN UA: 0.2 U/dL

## 2017-09-08 LAB — BASIC METABOLIC PANEL
Anion gap: 9 (ref 5–15)
BUN: 6 mg/dL (ref 6–20)
CALCIUM: 8.9 mg/dL (ref 8.9–10.3)
CHLORIDE: 107 mmol/L (ref 101–111)
CO2: 24 mmol/L (ref 22–32)
CREATININE: 0.9 mg/dL (ref 0.44–1.00)
GFR calc Af Amer: >60 mL/min (ref >60)
GFR calc non Af Amer: >60 mL/min (ref >60)
GLUCOSE: 86 mg/dL (ref 65–99)
Potassium: 4.2 mmol/L (ref 3.5–5.1)
Sodium: 140 mmol/L (ref 135–145)

## 2017-09-08 LAB — CBC
HCT: 43.1 % (ref 36.0–46.0)
HEMOGLOBIN: 14.3 g/dL (ref 12.0–15.0)
MCH: 29.4 pg (ref 26.0–34.0)
MCHC: 33.2 g/dL (ref 30.0–36.0)
MCV: 88.7 fL (ref 78.0–100.0)
PLATELETS: 296 10*3/uL (ref 150–400)
RBC: 4.86 MIL/uL (ref 3.87–5.11)
RDW: 13.8 % (ref 11.5–15.5)
WBC: 8.8 10*3/uL (ref 4.0–10.5)

## 2017-09-08 LAB — I-STAT BETA HCG BLOOD, ED (MC, WL, AP ONLY): I-stat hCG, quantitative: <5 m[IU]/mL (ref <5)

## 2017-09-08 MED ORDER — ONDANSETRON HCL 4 MG/2ML IJ SOLN
4.0000 mg | Freq: Once | INTRAMUSCULAR | Status: AC
  Administered 2017-09-08: 4 mg via INTRAVENOUS
  Filled 2017-09-08: qty 2

## 2017-09-08 MED ORDER — HYDROMORPHONE HCL 1 MG/ML IJ SOLN
0.5000 mg | Freq: Once | INTRAMUSCULAR | Status: AC
  Administered 2017-09-08: 0.5 mg via INTRAVENOUS
  Filled 2017-09-08: qty 1

## 2017-09-08 NOTE — ED Provider Notes (Signed)
[place] DEPT Provider Note   CSN: [id number] Arrival date & time: 09/08/17  1014     History   Chief Complaint Chief Complaint  Patient presents with  . Near Syncope    HPI Melissa [name] is a 47 y.o. female.  HPI Pt presents to the ED for evaluation  Of a syncopal episode.   Pt has a history of syncope.   Usually she can drink some fluids afterwards and it gets better.  She started having more near syncopal episodes over the last week.  She also started having a bad headache with numbness on the right side.   That has been on and off for weeks.  It is increasing in severity especially today. She went to see her doctor today and had a syncopal episode in the office.  She was sent to the ED for further evaluation.  Past Medical History:  Diagnosis Date  . Arthritis   . Bipolar disorder ([place])   . Fibromyalgia    on disability  . Migraine   . Muscle spasm   . Pain of right side of body    chronic  . Panic attack   . POTS (postural orthostatic tachycardia syndrome)   . Syncope    recurrent  . Uterine perforation    hx of    Patient Active Problem List   Diagnosis Date Noted  . HTN (hypertension) 05/09/2017  . Transient elevated blood pressure 04/26/2017  . Dizziness 04/18/2017  . Visual changes 04/18/2017  . Menorrhagia 11/11/2016  . Alopecia 03/02/2016  . Bilateral cataracts 08/14/2015  . Acne 10/21/2014  . Liver lesion 09/09/2014  . Vitamin D deficiency 09/09/2014  . Hot flashes 06/10/2014  . Chronic pain syndrome 05/20/2014  . Vitamin B12 deficiency 03/12/2014  . Contraception management 09/20/2013  . IBS (irritable bowel syndrome) 09/20/2013  . Unspecified vitamin D deficiency 04/03/2013  . HLD (hyperlipidemia) 04/03/2013  . Osteopenia 04/03/2013  . POTS (postural orthostatic tachycardia syndrome) 02/26/2013  . Migraine 11/29/2012  . Allergic rhinitis 10/05/2012  . Mixed bipolar I disorder in remission ([place]) 10/05/2012    . Combined fat and carbohydrate induced hyperlipemia 10/05/2012  . Fibromyalgia   . Bipolar disorder ([place])   . CFIDS (chronic fatigue and immune dysfunction syndrome) ([place]) 09/10/2011  . Clinical depression 09/10/2011  . Acid reflux 09/10/2011  . Non-restorative sleep 09/10/2011  . Disturbance in sleep behavior 09/10/2011    Past Surgical History:  Procedure Laterality Date  . CATARACT EXTRACTION  dec 2014  . DILATION AND CURETTAGE OF UTERUS    . LASIK  dec 2014    OB History   None      Home Medications    Prior to Admission medications   Medication Sig Start Date End Date Taking? Authorizing Provider  acetaminophen (TYLENOL) 500 MG tablet Take 1,000 mg by mouth daily as needed for mild pain.   Yes [provider]  cetirizine (ZYRTEC) 10 MG tablet Take 10 mg by mouth daily.   Yes [provider]  cholecalciferol (VITAMIN D) 1000 units tablet Take 5,000 Units by mouth daily.   Yes [provider]  clonazePAM (KLONOPIN) 1 MG tablet Take 1 tablet (1 mg total) by mouth 2 (two) times daily as needed. for anxiety 05/26/16  Yes Lucille Passy, MD  cyclobenzaprine (FLEXERIL) 5 MG tablet Take 1 tablet (5 mg total) by mouth 3 (three) times daily as needed for muscle spasms. 08/08/17  Yes Lucille Passy, MD  diclofenac sodium (VOLTAREN) 1 % GEL Apply to affected area three to four times daily. 01/27/17  Yes Lucille Passy, MD  fluvoxaMINE (LUVOX) 50 MG tablet Take 50-100 mg by mouth at bedtime.  05/23/17  Yes [provider]  gabapentin (NEURONTIN) 600 MG tablet Take 1 tablet (600 mg total) by mouth 3 (three) times daily. 06/24/17  Yes Lucille Passy, MD  HYDROcodone-acetaminophen (NORCO) 5-325 MG tablet Take 1 tablet every 6 (six) hours as needed by mouth for moderate pain. 05/09/17  Yes Lucille Passy, MD  ondansetron (ZOFRAN-ODT) 4 MG disintegrating tablet Take 1 tablet (4 mg total) by mouth every 8 (eight) hours as needed for nausea or vomiting. 05/26/17  Yes  Lucille Passy, MD  PREVIFEM 0.25-35 MG-MCG tablet Take 1 tablet by mouth daily.  03/11/17  Yes [provider]  ranitidine (ZANTAC) 150 MG tablet Take 150 mg by mouth daily.   Yes [provider]  verapamil (CALAN-SR) 120 MG CR tablet Take 1 tablet (120 mg total) by mouth at bedtime. 06/16/17  Yes Deboraha Sprang, MD  vitamin B-12 (CYANOCOBALAMIN) 1000 MCG tablet Take 1,000 mcg by mouth daily.   Yes [provider]    Family History Family History  Problem Relation Age of Onset  . Diabetes Mother   . Hypertension Mother   . Cancer Father        brain tumor  . Cancer Maternal Grandmother   . Cancer Maternal Grandfather   . Cancer Paternal Grandmother   . Cancer Paternal Grandfather   . Migraines Neg Hx     Social History Social History   Tobacco Use  . Smoking status: Never Smoker  . Smokeless tobacco: Never Used  Substance Use Topics  . Alcohol use: No    Alcohol/week: 0.0 oz    Comment: 1 holiday drink yearly  . Drug use: No     Allergies   Abilify [aripiprazole]; Depakote [divalproex sodium]; Erythromycin base; Lamotrigine; Seroquel [quetiapine fumarate]; Tetanus toxoids; Tramadol; Ziprasidone hcl; Azithromycin; Lithium; Penicillins; Pregabalin; and Sulfa antibiotics   Review of Systems Review of Systems  All other systems reviewed and are negative.    Physical Exam Updated Vital Signs BP 127/77   Pulse 91   Temp 98.4 F (36.9 C) (Oral)   Resp 16   LMP 08/13/2017   SpO2 100%   Physical Exam  Constitutional: She is oriented to person, place, and time. She appears well-developed and well-nourished. No distress.  HENT:  Head: Normocephalic and atraumatic.  Right Ear: External ear normal.  Left Ear: External ear normal.  Mouth/Throat: Oropharynx is clear and moist.  Eyes: Conjunctivae are normal. Right eye exhibits no discharge. Left eye exhibits no discharge. No scleral icterus.  Neck: Neck supple. No tracheal deviation present.    Cardiovascular: Normal rate, regular rhythm and intact distal pulses.  Pulmonary/Chest: Effort normal and breath sounds normal. No stridor. No respiratory distress. She has no wheezes. She has no rales.  Abdominal: Soft. Bowel sounds are normal. She exhibits no distension. There is no tenderness. There is no rebound and no guarding.  Musculoskeletal: She exhibits no edema or tenderness.  Neurological: She is alert and oriented to person, place, and time. She has normal strength. No cranial nerve deficit (No facial droop, extraocular movements intact, tongue midline ) or sensory deficit. She exhibits normal muscle tone. She displays no seizure activity. Coordination normal.  No pronator drift bilateral upper extrem, able to hold both legs off bed for 5 seconds,  sensation intact in all extremities, no visual field cuts, no left or right sided neglect, normal finger-nose exam bilaterally, no nystagmus noted   Skin: Skin is warm and dry. No rash noted.  Psychiatric: She has a normal mood and affect.  Nursing note and vitals reviewed.    ED Treatments / Results  Labs (all labs ordered are listed, but only abnormal results are displayed) Labs Reviewed  BASIC METABOLIC PANEL  CBC  URINALYSIS, ROUTINE W REFLEX MICROSCOPIC  I-STAT BETA HCG BLOOD, ED ([facility], [facility], [facility] ONLY)     EKG Interpretation  Date/Time:  Thursday September 08 2017 10:21:35 EDT Ventricular Rate:  75 PR Interval:    QRS Duration: 86 QT Interval:  383 QTC Calculation: 428 R Axis:   17 Text Interpretation:  Sinus rhythm Probable left atrial enlargement Low voltage, precordial leads No significant change since last tracing Confirmed by Dorie Rank [phone]) on 09/08/2017 3:24:00 PM        Procedures Procedures (including critical care time)  Medications Ordered in ED Medications  HYDROmorphone (DILAUDID) injection 0.5 mg (0.5 mg Intravenous Given 09/08/17 1536)  ondansetron (ZOFRAN) injection 4 mg (4 mg Intravenous Given 09/08/17  1536)     Initial Impression / Assessment and Plan / ED Course  I have reviewed the triage vital signs and the nursing notes.  Pertinent labs & imaging results that were available during my care of the patient were reviewed by me and considered in my medical decision making (see chart for details).  Clinical Course as of Sep 08 1624  Thu Sep 08, 2017  1531 Discussed treatment options with patient including brain imaging.  Pt states she has had this before.  Previous brain imaging was always negative.  Would prefer not to do this again.  Would like something for her migraine.  Only thing that works is dilaudid.  Agreed to one does while in the ED   [JK]  1622 Normal  Basic metabolic panel [JK]  4580 Normal  CBC [JK]  1624 Normal  I-Stat beta hCG blood, ED [JK]    Clinical Course User Index [JK] Dorie Rank, MD   Patient presented to the emergency room for evaluation after a syncopal episode.  Patient states initially she was having trouble with a headache and some numbness on her right side.  Patient unfortunately had to wait a few hours before she was seen.  She was given IV fluid bolus prior to my evaluation.  Patient was no longer feeling lightheaded or dizzy.  She does have a history of pots syndrome.  I suspect her syncopal episode could have been related to that.  I was concerned about her headache and the syncopal episode.  We discussed doing brain imaging.  Patient states this has happened to her before.  Her CT scans and MRIs have always been negative.  She did not want to have brain imaging here today.  She did ask for something for her headache.  Patient does have history of migraines.  She has no focal neurologic deficits on exam.  I suspect she might of had a complex migraine.  Patient understands she can return for any worsening symptoms.  Otherwise she appears stable for discharge.   Final Clinical Impressions(s) / ED Diagnoses   Final diagnoses:  Acute nonintractable  headache, unspecified headache type  Syncope, unspecified syncope type    ED Discharge Orders    None       Dorie Rank, MD 09/08/17 [phone]

## 2017-09-08 NOTE — Progress Notes (Signed)
Subjective:   Patient ID: Melissa [name], female    DOB: 08/17/1970, 47 y.o.   MRN: [id number]  Melissa [name] is a pleasant 47 y.o. year old female who presents to clinic today with Dizziness (Vertigo for 3 days, have to sit otherwise will pass out. Feels like pt is swaying/ off balance.. been pushing fluids.)  on 09/08/2017  HPI:  Had syncopal episode while waiting to be seen.  Husband witnessed episode and caught her.  She did not hit her head. Mr. Ephraim states she had LOC for approximately 15 seconds.  No seizure like activity.  EMS called and took pt to hospital. Current Outpatient Medications on File Prior to Visit  Medication Sig Dispense Refill  . clonazePAM (KLONOPIN) 1 MG tablet Take 1 tablet (1 mg total) by mouth 2 (two) times daily as needed. for anxiety 60 tablet 0  . cyclobenzaprine (FLEXERIL) 5 MG tablet Take 1 tablet (5 mg total) by mouth 3 (three) times daily as needed for muscle spasms. 30 tablet 1  . diclofenac sodium (VOLTAREN) 1 % GEL Apply to affected area three to four times daily. 100 g 0  . fluvoxaMINE (LUVOX) 50 MG tablet Take 50-100 mg by mouth at bedtime.     . gabapentin (NEURONTIN) 600 MG tablet Take 1 tablet (600 mg total) by mouth 3 (three) times daily. 90 tablet 3  . HYDROcodone-acetaminophen (NORCO) 5-325 MG tablet Take 1 tablet every 6 (six) hours as needed by mouth for moderate pain. 60 tablet 0  . ondansetron (ZOFRAN-ODT) 4 MG disintegrating tablet Take 1 tablet (4 mg total) by mouth every 8 (eight) hours as needed for nausea or vomiting. 20 tablet 5  . PREVIFEM 0.25-35 MG-MCG tablet Take 1 tablet by mouth daily.     . ranitidine (ZANTAC) 150 MG tablet Take 150 mg by mouth daily.    . verapamil (CALAN-SR) 120 MG CR tablet Take 1 tablet (120 mg total) by mouth at bedtime. 30 tablet 4  . vitamin B-12 (CYANOCOBALAMIN) 1000 MCG tablet Take 1,000 mcg by mouth daily.     No current facility-administered medications on file prior to visit.     Allergies    Allergen Reactions  . Abilify [Aripiprazole] Other (See Comments)    Weight gain, did not help,   . Depakote [Divalproex Sodium] Other (See Comments)    Hair loss  . Erythromycin Base Swelling  . Lamotrigine Other (See Comments)    Weight gain, angioedema, hives  . Seroquel [Quetiapine Fumarate] Other (See Comments)    irritable  . Tetanus Toxoids     Other reaction(s): OTHER  . Tramadol   . Ziprasidone Hcl Other (See Comments)    Heat/cold intolerance.   . Azithromycin Rash  . Lithium Rash  . Penicillins Rash    Has patient had a PCN reaction causing immediate rash, facial/tongue/throat swelling, SOB or lightheadedness with hypotension: Yes- swelling, rash  Has patient had a PCN reaction causing severe rash involving mucus membranes or skin necrosis: Yes Has patient had a PCN reaction that required hospitalization No Has patient had a PCN reaction occurring within the last 10 years: No If all of the above answers are "NO", then may proceed with Cephalosporin use.   . Pregabalin Palpitations    Manic symptoms, no sleep x's 3 days, no appetite.  . Sulfa Antibiotics Rash    Past Medical History:  Diagnosis Date  . Arthritis   . Bipolar disorder ([place])   . Fibromyalgia  on disability  . Migraine   . Muscle spasm   . Pain of right side of body    chronic  . Panic attack   . POTS (postural orthostatic tachycardia syndrome)   . Syncope    recurrent  . Uterine perforation    hx of    Past Surgical History:  Procedure Laterality Date  . CATARACT EXTRACTION  dec 2014  . DILATION AND CURETTAGE OF UTERUS    . LASIK  dec 2014    Family History  Problem Relation Age of Onset  . Diabetes Mother   . Hypertension Mother   . Cancer Father        brain tumor  . Cancer Maternal Grandmother   . Cancer Maternal Grandfather   . Cancer Paternal Grandmother   . Cancer Paternal Grandfather   . Migraines Neg Hx     Social History   Socioeconomic History  . Marital  status: Married    Spouse name: Izell [place]  . Number of children: 0  . Years of education: HS/college  . Highest education level: Not on file  Occupational History    Employer: OTHER    Comment: disabled  Social Needs  . Financial resource strain: Not on file  . Food insecurity:    Worry: Not on file    Inability: Not on file  . Transportation needs:    Medical: Not on file    Non-medical: Not on file  Tobacco Use  . Smoking status: Never Smoker  . Smokeless tobacco: Never Used  Substance and Sexual Activity  . Alcohol use: No    Alcohol/week: 0.0 oz    Comment: 1 holiday drink yearly  . Drug use: No  . Sexual activity: Yes    Birth control/protection: Injection  Lifestyle  . Physical activity:    Days per week: Not on file    Minutes per session: Not on file  . Stress: Not on file  Relationships  . Social connections:    Talks on phone: Not on file    Gets together: Not on file    Attends religious service: Not on file    Active member of club or organization: Not on file    Attends meetings of clubs or organizations: Not on file    Relationship status: Not on file  . Intimate partner violence:    Fear of current or ex partner: Not on file    Emotionally abused: Not on file    Physically abused: Not on file    Forced sexual activity: Not on file  Other Topics Concern  . Not on file  Social History Narrative   Patient is married Izell [place]) and lives at home with her husband.   Married 20+ years   From [place]   On disability for fibromyalgia   Caffeine Use: none    The PMH, PSH, Social History, Family History, Medications, and allergies have been reviewed in [facility], and have been updated if relevant.   Review of Systems  Unable to perform ROS: Mental status change       Objective:    BP 118/72 (BP Location: Left Arm, Patient Position: Sitting, Cuff Size: Normal)   Pulse 99   Temp 98.5 F (36.9 C) (Oral)   Ht 5\' 9"  (1.753 m)   Wt 158 lb 9.6 oz (71.9 kg)   LMP  08/13/2017   SpO2 97%   BMI 23.42 kg/m    Physical Exam   No exam done- EMS called-  Pt was alert by the time I came in the room.     Assessment & Plan:   Hematuria, unspecified type - Plan: POCT urinalysis dipstick  Syncope, unspecified syncope type No follow-ups on file.

## 2017-09-08 NOTE — Discharge Instructions (Addendum)
Continue your current medications, make sure to drink plenty of fluids, follow-up with your primary care doctor if the symptoms are not improving

## 2017-09-08 NOTE — ED Triage Notes (Signed)
Per EMS pt syncopal episode at [place] office; denies injury. Was going to doctor's office to be seen for increase in syncopal events; known for same with hx of POTS.

## 2017-09-08 NOTE — Telephone Encounter (Signed)
Spoke with spouse, he verbalize understand that Dr. Deborra Medina is unable to do anything once the pt is in the ED.

## 2017-09-08 NOTE — Telephone Encounter (Signed)
Pt's spouse call request to speak to Dr. Deborra Medina or assistant. He said that they came over to the ED and they have been waiting there for 6 hours now, pt is NPO and had blood work done.   Pt was wondering if Dr. Deborra Medina can look at the lab result and give advise, not sure how much we can do. Please advise.

## 2017-09-10 DIAGNOSIS — R55 Syncope and collapse: Secondary | ICD-10-CM | POA: Insufficient documentation

## 2017-09-10 NOTE — Assessment & Plan Note (Signed)
EMS called and pt sent to hospital.

## 2017-10-15 ENCOUNTER — Other Ambulatory Visit: Payer: Self-pay | Admitting: Family Medicine

## 2017-11-17 ENCOUNTER — Ambulatory Visit: Payer: PPO | Admitting: Family Medicine

## 2017-11-17 ENCOUNTER — Other Ambulatory Visit: Payer: Self-pay | Admitting: Family Medicine

## 2017-11-17 ENCOUNTER — Other Ambulatory Visit: Payer: Self-pay | Admitting: Internal Medicine

## 2017-11-17 NOTE — Telephone Encounter (Signed)
Outpatient Medication Detail    Disp Refills Start End   verapamil (CALAN-SR) 120 MG CR tablet 30 tablet 4 06/16/2017    Sig - Route: Take 1 tablet (120 mg total) by mouth at bedtime. - Oral   Sent to pharmacy as: verapamil (CALAN-SR) 120 MG CR tablet   Notes to Pharmacy: PCP will fill after RX expires   E-Prescribing Status: Receipt confirmed by pharmacy (06/16/2017 5:16 PM EST)   [place], [place]   Patient Instructions by Bobby Rumpf, [place] at 06/16/2017 3:30 PM   Author: Bobby Rumpf, CMA Author Type: Certified Medical Assistant Filed: 06/16/2017 5:17 PM  Note Status: Signed Cosign: Cosign Not Required Encounter Date: 06/16/2017  Editor: Bobby Rumpf, [place] (Certified Medical Assistant)    Medication Instructions: Your physician has recommended you make the following change in your medication:  -1) INCREASE Verapamil 120 mg - Take 1 tablet by mouth daily - NEW RX SENT  Labwork: None  Procedures/Testing: None  Follow-Up: Your physician recommends that you schedule a follow-up appointment as needed with Dr. Caryl Comes. Please follow up with your Primary Care Physician for medication refills.

## 2017-11-24 DIAGNOSIS — F3162 Bipolar disorder, current episode mixed, moderate: Secondary | ICD-10-CM | POA: Diagnosis not present

## 2017-12-01 ENCOUNTER — Encounter: Payer: Self-pay | Admitting: Family Medicine

## 2017-12-01 ENCOUNTER — Ambulatory Visit (INDEPENDENT_AMBULATORY_CARE_PROVIDER_SITE_OTHER): Payer: PPO | Admitting: Family Medicine

## 2017-12-01 VITALS — BP 100/64 | HR 90 | Temp 98.7°F | Ht 69.0 in | Wt 156.4 lb

## 2017-12-01 DIAGNOSIS — I951 Orthostatic hypotension: Secondary | ICD-10-CM

## 2017-12-01 DIAGNOSIS — R000 Tachycardia, unspecified: Secondary | ICD-10-CM | POA: Diagnosis not present

## 2017-12-01 DIAGNOSIS — M797 Fibromyalgia: Secondary | ICD-10-CM | POA: Diagnosis not present

## 2017-12-01 DIAGNOSIS — M62838 Other muscle spasm: Secondary | ICD-10-CM | POA: Diagnosis not present

## 2017-12-01 DIAGNOSIS — G90A Postural orthostatic tachycardia syndrome (POTS): Secondary | ICD-10-CM

## 2017-12-01 MED ORDER — VERAPAMIL HCL ER 120 MG PO TBCR: 120.0000 mg | EXTENDED_RELEASE_TABLET | Freq: Every day | ORAL | 3 refills | Status: DC

## 2017-12-01 MED ORDER — NORGESTIMATE-ETH ESTRADIOL 0.25-35 MG-MCG PO TABS: 1.0000 | ORAL_TABLET | Freq: Every day | ORAL | 11 refills | Status: DC

## 2017-12-01 MED ORDER — CYCLOBENZAPRINE HCL 10 MG PO TABS: 10.0000 mg | ORAL_TABLET | Freq: Three times a day (TID) | ORAL | 0 refills | Status: DC | PRN

## 2017-12-01 MED ORDER — HYDROCODONE-ACETAMINOPHEN 5-325 MG PO TABS: 1.0000 | ORAL_TABLET | Freq: Four times a day (QID) | ORAL | 0 refills | Status: DC | PRN

## 2017-12-01 NOTE — Patient Instructions (Signed)
Great to see you.  Please come see me in 3 months.  Update me about message at [facility].

## 2017-12-01 NOTE — Assessment & Plan Note (Signed)
Not responding as well to flexeril- increase dose, she is aware of sedation precautions. Looking into massage as well.

## 2017-12-01 NOTE — Assessment & Plan Note (Signed)
>  25 minutes spent in face to face time with patient, >50% spent in counselling or coordination of care Discussed tx options- will keep rxs the same for now.  Massage therapy has helped in past- she has heard [facility] offers this now? If insurance will pay.  She will find out for me and let me know.

## 2017-12-01 NOTE — Progress Notes (Signed)
Subjective:   Patient ID: Melissa [name], female    DOB: 09-Apr-1971, 47 y.o.   MRN: [id number]  Melissa [name] is a pleasant 47 y.o. year old female who presents to clinic today with Follow-up (Patient is here today to F/U with POTS.  At last visit we sent her to the ED via EMS.  She states that she has been in a lot of pain everywhere "legs, arms, back, neck"  which has been really bothersome x8wks with her Fibro.)  on 12/01/2017  HPI: POTs- Last OV wit me on 08/30/17, we sent her directly from the office via EMS to the ED for syncope.  ER note reviewed.  GIven IV bolus while waiting in the ER and by the time she was seen, symptoms had resolved.  Her neuro exam was reassuring.  Dr. Tomi Bamberger felt symptoms likely complex migraine complicated by her h/o POTs.  Given her reassuring exam and how she responded to IVFs, I too agree with that assessment.  Fibromyalgia- pain has worsened but she thinks it's more muscle spasms in her neck and shoulder since she gets tingling with it too.  Does spend long hours looking down. No tingling or numbness in her arms or hands.  Current Outpatient Medications on File Prior to Visit  Medication Sig Dispense Refill  . acetaminophen (TYLENOL) 500 MG tablet Take 1,000 mg by mouth daily as needed for mild pain.    Marland Kitchen buPROPion (WELLBUTRIN XL) 150 MG 24 hr tablet Take 1 tablet by mouth daily.    . cetirizine (ZYRTEC) 10 MG tablet Take 10 mg by mouth daily.    . cholecalciferol (VITAMIN D) 1000 units tablet Take 5,000 Units by mouth daily.    . clonazePAM (KLONOPIN) 1 MG tablet Take 1 tablet (1 mg total) by mouth 2 (two) times daily as needed. for anxiety 60 tablet 0  . cyclobenzaprine (FLEXERIL) 5 MG tablet TAKE 1 TABLET BY MOUTH THREE TIMES DAILYAS NEEDED FOR MUSCLE SPASMS 30 tablet 1  . diclofenac sodium (VOLTAREN) 1 % GEL Apply to affected area three to four times daily. 100 g 0  . fluvoxaMINE (LUVOX) 50 MG tablet Take 50-100 mg by mouth at bedtime.     . gabapentin  (NEURONTIN) 600 MG tablet Take 1 tablet (600 mg total) by mouth 3 (three) times daily. 90 tablet 3  . HYDROcodone-acetaminophen (NORCO) 5-325 MG tablet Take 1 tablet every 6 (six) hours as needed by mouth for moderate pain. 60 tablet 0  . ondansetron (ZOFRAN-ODT) 4 MG disintegrating tablet Take 1 tablet (4 mg total) by mouth every 8 (eight) hours as needed for nausea or vomiting. 20 tablet 5  . PREVIFEM 0.25-35 MG-MCG tablet Take 1 tablet by mouth daily.     . ranitidine (ZANTAC) 150 MG tablet Take 150 mg by mouth daily.    . verapamil (CALAN-SR) 120 MG CR tablet TAKE ONE TABLET BY MOUTH AT BEDTIME 30 tablet 0  . vitamin B-12 (CYANOCOBALAMIN) 1000 MCG tablet Take 1,000 mcg by mouth daily.     No current facility-administered medications on file prior to visit.     Allergies  Allergen Reactions  . Abilify [Aripiprazole] Other (See Comments)    Weight gain, did not help,   . Depakote [Divalproex Sodium] Other (See Comments)    Hair loss  . Erythromycin Base Swelling  . Lamotrigine Other (See Comments)    Weight gain, angioedema, hives  . Seroquel [Quetiapine Fumarate] Other (See Comments)    irritable  .  Tetanus Toxoids     Other reaction(s): OTHER  . Tramadol   . Ziprasidone Hcl Other (See Comments)    Heat/cold intolerance.   . Azithromycin Rash  . Lithium Rash  . Penicillins Rash    Has patient had a PCN reaction causing immediate rash, facial/tongue/throat swelling, SOB or lightheadedness with hypotension: Yes- swelling, rash  Has patient had a PCN reaction causing severe rash involving mucus membranes or skin necrosis: Yes Has patient had a PCN reaction that required hospitalization No Has patient had a PCN reaction occurring within the last 10 years: No If all of the above answers are "NO", then may proceed with Cephalosporin use.   . Pregabalin Palpitations    Manic symptoms, no sleep x's 3 days, no appetite.  . Sulfa Antibiotics Rash    Past Medical History:  Diagnosis  Date  . Arthritis   . Bipolar disorder ([place])   . Fibromyalgia    on disability  . Migraine   . Muscle spasm   . Pain of right side of body    chronic  . Panic attack   . POTS (postural orthostatic tachycardia syndrome)   . Syncope    recurrent  . Uterine perforation    hx of    Past Surgical History:  Procedure Laterality Date  . CATARACT EXTRACTION  dec 2014  . DILATION AND CURETTAGE OF UTERUS    . LASIK  dec 2014    Family History  Problem Relation Age of Onset  . Diabetes Mother   . Hypertension Mother   . Cancer Father        brain tumor  . Cancer Maternal Grandmother   . Cancer Maternal Grandfather   . Cancer Paternal Grandmother   . Cancer Paternal Grandfather   . Migraines Neg Hx     Social History   Socioeconomic History  . Marital status: Married    Spouse name: Izell [place]  . Number of children: 0  . Years of education: HS/college  . Highest education level: Not on file  Occupational History    Employer: OTHER    Comment: disabled  Social Needs  . Financial resource strain: Not on file  . Food insecurity:    Worry: Not on file    Inability: Not on file  . Transportation needs:    Medical: Not on file    Non-medical: Not on file  Tobacco Use  . Smoking status: Never Smoker  . Smokeless tobacco: Never Used  Substance and Sexual Activity  . Alcohol use: No    Alcohol/week: 0.0 oz    Comment: 1 holiday drink yearly  . Drug use: No  . Sexual activity: Yes    Birth control/protection: Injection  Lifestyle  . Physical activity:    Days per week: Not on file    Minutes per session: Not on file  . Stress: Not on file  Relationships  . Social connections:    Talks on phone: Not on file    Gets together: Not on file    Attends religious service: Not on file    Active member of club or organization: Not on file    Attends meetings of clubs or organizations: Not on file    Relationship status: Not on file  . Intimate partner violence:    Fear of  current or ex partner: Not on file    Emotionally abused: Not on file    Physically abused: Not on file    Forced sexual activity: Not  on file  Other Topics Concern  . Not on file  Social History Narrative   Patient is married Izell [place]) and lives at home with her husband.   Married 20+ years   From [place]   On disability for fibromyalgia   Caffeine Use: none    The PMH, PSH, Social History, Family History, Medications, and allergies have been reviewed in [facility], and have been updated if relevant.   Review of Systems  Musculoskeletal: Positive for myalgias.  Neurological: Positive for numbness. Negative for dizziness, tremors, seizures, syncope, facial asymmetry, speech difficulty, weakness, light-headedness and headaches.  All other systems reviewed and are negative.      Objective:    BP 100/64 (BP Location: Left Arm, Patient Position: Sitting, Cuff Size: Normal)   Pulse 90   Temp 98.7 F (37.1 C) (Oral)   Ht 5\' 9"  (1.753 m)   Wt 156 lb 6.4 oz (70.9 kg)   LMP 11/03/2017   SpO2 97%   BMI 23.10 kg/m    Physical Exam   General:  Well-developed,well-nourished,in no acute distress; alert,appropriate and cooperative throughout examination Head:  normocephalic and atraumatic.   Eyes:  vision grossly intact, PERRL Ears:  R ear normal and L ear normal externally, TMs clear bilaterally Nose:  no external deformity.   Mouth:  good dentition.   Neck:  No deformities, masses, or tenderness noted. Lungs:  Normal respiratory effort, chest expands symmetrically. Lungs are clear to auscultation, no crackles or wheezes. Heart:  Normal rate and regular rhythm. S1 and S2 normal without gallop, murmur, click, rub or other extra sounds. Msk:  No deformity or scoliosis noted of thoracic or lumbar spine.   Extremities:  No clubbing, cyanosis, edema, or deformity noted with normal full range of motion of all joints.  +tight trapezius, left > right  Neurologic:  alert & oriented X3 and gait  normal.   Skin:  Intact without suspicious lesions or rashes Psych:  Cognition and judgment appear intact. Alert and cooperative with normal attention span and concentration. No apparent delusions, illusions, hallucinations       Assessment & Plan:   POTS (postural orthostatic tachycardia syndrome)  Fibromyalgia No follow-ups on file.

## 2017-12-01 NOTE — Assessment & Plan Note (Signed)
Has been staying hydrated and feels she has been doing well from a POTs standpoint.

## 2017-12-09 ENCOUNTER — Emergency Department
Admission: EM | Admit: 2017-12-09 | Discharge: 2017-12-09 | Disposition: A | Discharge status: Home / Self Care | Payer: PPO | Attending: Emergency Medicine | Admitting: Emergency Medicine

## 2017-12-09 ENCOUNTER — Encounter: Payer: Self-pay | Admitting: Emergency Medicine

## 2017-12-09 DIAGNOSIS — R42 Dizziness and giddiness: Secondary | ICD-10-CM | POA: Diagnosis not present

## 2017-12-09 DIAGNOSIS — R55 Syncope and collapse: Secondary | ICD-10-CM

## 2017-12-09 DIAGNOSIS — Z79899 Other long term (current) drug therapy: Secondary | ICD-10-CM | POA: Diagnosis not present

## 2017-12-09 DIAGNOSIS — R110 Nausea: Secondary | ICD-10-CM | POA: Diagnosis present

## 2017-12-09 LAB — URINALYSIS, COMPLETE (UACMP) WITH MICROSCOPIC
BACTERIA UA: NONE SEEN
Bilirubin Urine: NEGATIVE
Glucose, UA: NEGATIVE mg/dL
Ketones, ur: NEGATIVE mg/dL
Leukocytes, UA: NEGATIVE
NITRITE: NEGATIVE
PROTEIN: NEGATIVE mg/dL
SPECIFIC GRAVITY, URINE: 1.016 (ref 1.005–1.030)
pH: 5 (ref 5.0–8.0)

## 2017-12-09 LAB — BASIC METABOLIC PANEL
Anion gap: 10 (ref 5–15)
BUN: 11 mg/dL (ref 6–20)
CO2: 19 mmol/L — AB (ref 22–32)
Calcium: 8.4 mg/dL — ABNORMAL LOW (ref 8.9–10.3)
Chloride: 104 mmol/L (ref 101–111)
Creatinine, Ser: 0.86 mg/dL (ref 0.44–1.00)
GFR calc Af Amer: >60 mL/min (ref >60)
GLUCOSE: 121 mg/dL — AB (ref 65–99)
POTASSIUM: 3.9 mmol/L (ref 3.5–5.1)
Sodium: 133 mmol/L — ABNORMAL LOW (ref 135–145)

## 2017-12-09 LAB — CBC
HEMATOCRIT: 43.2 % (ref 35.0–47.0)
Hemoglobin: 14.7 g/dL (ref 12.0–16.0)
MCH: 29.5 pg (ref 26.0–34.0)
MCHC: 34.1 g/dL (ref 32.0–36.0)
MCV: 86.5 fL (ref 80.0–100.0)
Platelets: 286 10*3/uL (ref 150–440)
RBC: 5 MIL/uL (ref 3.80–5.20)
RDW: 13.6 % (ref 11.5–14.5)
WBC: 9.8 10*3/uL (ref 3.6–11.0)

## 2017-12-09 LAB — POCT PREGNANCY, URINE: PREG TEST UR: NEGATIVE

## 2017-12-09 MED ORDER — SODIUM CHLORIDE 0.9 % IV BOLUS
1000.0000 mL | Freq: Once | INTRAVENOUS | Status: AC
Start: 2017-12-09 — End: 2017-12-09
  Administered 2017-12-09: 1000 mL via INTRAVENOUS

## 2017-12-09 MED ORDER — ONDANSETRON HCL 4 MG/2ML IJ SOLN
4.0000 mg | Freq: Once | INTRAMUSCULAR | Status: AC
  Administered 2017-12-09: 4 mg via INTRAVENOUS
  Filled 2017-12-09: qty 2

## 2017-12-09 NOTE — ED Notes (Signed)
Pt states she has had nausea and dizziness since 4 am this morning. She says she is constantly lightheaded but the dizziness comes and goes. Pt alert and oriented x 4. Family at bedside.

## 2017-12-09 NOTE — ED Notes (Signed)
Spoke with pt about wait times and what to expect next. Advised pt that I am available for further questions if needed.  

## 2017-12-09 NOTE — ED Triage Notes (Signed)
Patient presents to the ED with dizziness and like she might pass out around 4am.  Patient states she has history of POTS but this feels much worse than normal.  Patient states she feels very nauseous and hasn't wanted to drink fluids.  Patient denies vomiting.

## 2017-12-09 NOTE — ED Provider Notes (Signed)
[facility] Emergency Department Provider Note  ____________________________________________   I have reviewed the triage vital signs and the nursing notes. Where available I have reviewed prior notes and, if possible and indicated, outside hospital notes.    HISTORY  Chief Complaint Nausea; Dizziness; and Near Syncope    HPI Melissa [name] is a 47 y.o. female with a long history of fibromyalgia bipolar disorder, chronic migraines, panic attacks and feeling lightheaded on a regular basis diagnosed with p.o. she presents today with feeling lightheaded today.  Did not pass out no chest pain or shortness of breath no nausea no vomiting no focal or particular concerns is just her chronic lightheadedness is related to head again she states.  She states that she can give herself IV fluid at home she would not come in.  She had some nausea but no vomiting.  Patient has had this since at least 2013 she states.  This is not different from any other presentation according to her. She states this is her normal state of affairs, she denies any alleviating or aggravating symptoms except for standing up too quickly makes her feel lightheaded.  She denies any SI or HI does not feel that she is being abused and she is not under increased stress.  States that she did not orally hydrate herself very much at home which she normally does because she had some mild nausea.   Past Medical History:  Diagnosis Date  . Arthritis   . Bipolar disorder ([place])   . Fibromyalgia    on disability  . Migraine   . Muscle spasm   . Pain of right side of body    chronic  . Panic attack   . POTS (postural orthostatic tachycardia syndrome)   . Syncope    recurrent  . Uterine perforation    hx of    Patient Active Problem List   Diagnosis Date Noted  . Trapezius muscle spasm 12/01/2017  . Syncopal episodes 09/10/2017  . HTN (hypertension) 05/09/2017  . Transient elevated blood pressure  04/26/2017  . Dizziness 04/18/2017  . Visual changes 04/18/2017  . Menorrhagia 11/11/2016  . Alopecia 03/02/2016  . Bilateral cataracts 08/14/2015  . Acne 10/21/2014  . Liver lesion 09/09/2014  . Vitamin D deficiency 09/09/2014  . Hot flashes 06/10/2014  . Chronic pain syndrome 05/20/2014  . Vitamin B12 deficiency 03/12/2014  . Contraception management 09/20/2013  . IBS (irritable bowel syndrome) 09/20/2013  . Unspecified vitamin D deficiency 04/03/2013  . HLD (hyperlipidemia) 04/03/2013  . Osteopenia 04/03/2013  . POTS (postural orthostatic tachycardia syndrome) 02/26/2013  . Migraine 11/29/2012  . Allergic rhinitis 10/05/2012  . Mixed bipolar I disorder in remission ([place]) 10/05/2012  . Combined fat and carbohydrate induced hyperlipemia 10/05/2012  . Fibromyalgia   . Bipolar disorder ([place])   . CFIDS (chronic fatigue and immune dysfunction syndrome) ([place]) 09/10/2011  . Clinical depression 09/10/2011  . Acid reflux 09/10/2011  . Non-restorative sleep 09/10/2011  . Disturbance in sleep behavior 09/10/2011    Past Surgical History:  Procedure Laterality Date  . CATARACT EXTRACTION  dec 2014  . DILATION AND CURETTAGE OF UTERUS    . LASIK  dec 2014    Prior to Admission medications   Medication Sig Start Date End Date Taking? Authorizing Provider  acetaminophen (TYLENOL) 500 MG tablet Take 1,000 mg by mouth daily as needed for mild pain.    [provider]  buPROPion (WELLBUTRIN XL) 150 MG 24 hr tablet Take 1  tablet by mouth daily. 11/24/17   [provider]  cetirizine (ZYRTEC) 10 MG tablet Take 10 mg by mouth daily.    [provider]  cholecalciferol (VITAMIN D) 1000 units tablet Take 5,000 Units by mouth daily.    [provider]  clonazePAM (KLONOPIN) 1 MG tablet Take 1 tablet (1 mg total) by mouth 2 (two) times daily as needed. for anxiety 05/26/16   Lucille Passy, MD  cyclobenzaprine (FLEXERIL) 10 MG tablet Take 1 tablet (10 mg total) by  mouth 3 (three) times daily as needed for muscle spasms. 12/01/17   Lucille Passy, MD  diclofenac sodium (VOLTAREN) 1 % GEL Apply to affected area three to four times daily. 01/27/17   Lucille Passy, MD  fluvoxaMINE (LUVOX) 50 MG tablet Take 50-100 mg by mouth at bedtime.  05/23/17   [provider]  gabapentin (NEURONTIN) 600 MG tablet Take 1 tablet (600 mg total) by mouth 3 (three) times daily. 06/24/17   Lucille Passy, MD  HYDROcodone-acetaminophen (NORCO) 5-325 MG tablet Take 1 tablet by mouth every 6 (six) hours as needed for moderate pain. 12/01/17   Lucille Passy, MD  norgestimate-ethinyl estradiol (ORTHO-CYCLEN,SPRINTEC,PREVIFEM) 0.25-35 MG-MCG tablet Take 1 tablet by mouth daily. 12/01/17   Lucille Passy, MD  ondansetron (ZOFRAN-ODT) 4 MG disintegrating tablet Take 1 tablet (4 mg total) by mouth every 8 (eight) hours as needed for nausea or vomiting. 05/26/17   Lucille Passy, MD  ranitidine (ZANTAC) 150 MG tablet Take 150 mg by mouth daily.    [provider]  verapamil (CALAN-SR) 120 MG CR tablet Take 1 tablet (120 mg total) by mouth at bedtime. 12/01/17   Lucille Passy, MD  vitamin B-12 (CYANOCOBALAMIN) 1000 MCG tablet Take 1,000 mcg by mouth daily.    [provider]    Allergies Abilify [aripiprazole]; Depakote [divalproex sodium]; Erythromycin base; Lamotrigine; Seroquel [quetiapine fumarate]; Tetanus toxoids; Tramadol; Ziprasidone hcl; Azithromycin; Lithium; Penicillins; Pregabalin; and Sulfa antibiotics  Family History  Problem Relation Age of Onset  . Diabetes Mother   . Hypertension Mother   . Cancer Father        brain tumor  . Cancer Maternal Grandmother   . Cancer Maternal Grandfather   . Cancer Paternal Grandmother   . Cancer Paternal Grandfather   . Migraines Neg Hx     Social History Social History   Tobacco Use  . Smoking status: Never Smoker  . Smokeless tobacco: Never Used  Substance Use Topics  . Alcohol use: No    Alcohol/week: 0.0 oz     Comment: 1 holiday drink yearly  . Drug use: No    Review of Systems Constitutional: No fever/chills Eyes: No visual changes. ENT: No sore throat. No stiff neck no neck pain Cardiovascular: Denies chest pain. Respiratory: Denies shortness of breath. Gastrointestinal:   no vomiting.  No diarrhea.  No constipation. Genitourinary: Negative for dysuria. Musculoskeletal: Negative lower extremity swelling Skin: Negative for rash. Neurological: Negative for severe headaches, focal weakness or numbness.   ____________________________________________   PHYSICAL EXAM:  VITAL SIGNS: ED Triage Vitals  Enc Vitals Group     BP 12/09/17 1255 109/71     Pulse Rate 12/09/17 1255 73     Resp 12/09/17 1255 15     Temp 12/09/17 1255 99.2 F (37.3 C)     Temp Source 12/09/17 1255 Oral     SpO2 12/09/17 1255 96 %     Weight 12/09/17 1302 150  lb (68 kg)     Height 12/09/17 1302 5\' 9"  (1.753 m)     Head Circumference --      Peak Flow --      Pain Score 12/09/17 1302 4     Pain Loc --      Pain Edu? --      Excl. in [place]? --     Constitutional: Alert and oriented. Well appearing and in no acute distress. Eyes: Conjunctivae are normal Head: Atraumatic HEENT: No congestion/rhinnorhea. Mucous membranes are moist.  Oropharynx non-erythematous Neck:   Nontender with no meningismus, no masses, no stridor Cardiovascular: Normal rate, regular rhythm. Grossly normal heart sounds.  Good peripheral circulation. Respiratory: Normal respiratory effort.  No retractions. Lungs CTAB. Abdominal: Soft and nontender. No distention. No guarding no rebound Back:  There is no focal tenderness or step off.  there is no midline tenderness there are no lesions noted. there is no CVA tenderness Musculoskeletal: No lower extremity tenderness, no upper extremity tenderness. No joint effusions, no DVT signs strong distal pulses no edema Neurologic:  Normal speech and language. No gross focal neurologic deficits are  appreciated.  Skin:  Skin is warm, dry and intact. No rash noted. Psychiatric: Mood and affect are normal. Speech and behavior are normal.  ____________________________________________   LABS (all labs ordered are listed, but only abnormal results are displayed)  Labs Reviewed  BASIC METABOLIC PANEL - Abnormal; Notable for the following components:      Result Value   Sodium 133 (*)    CO2 19 (*)    Glucose, Bld 121 (*)    Calcium 8.4 (*)    All other components within normal limits  URINALYSIS, COMPLETE (UACMP) WITH MICROSCOPIC - Abnormal; Notable for the following components:   Color, Urine YELLOW (*)    APPearance CLEAR (*)    Hgb urine dipstick SMALL (*)    All other components within normal limits  CBC  CBG MONITORING, ED  POC URINE PREG, ED  POCT PREGNANCY, URINE    Pertinent labs  results that were available during my care of the patient were reviewed by me and considered in my medical decision making (see chart for details). ____________________________________________  EKG  I personally interpreted any EKGs ordered by me or triage EKG shows very mild tachycardia rate 107, no acute ST elevation or depression normal axis otherwise unremarkable EKG ____________________________________________  RADIOLOGY  Pertinent labs & imaging results that were available during my care of the patient were reviewed by me and considered in my medical decision making (see chart for details). If possible, patient and/or family made aware of any abnormal findings.  No results found. ____________________________________________    PROCEDURES  Procedure(s) performed: None  Procedures  Critical Care performed: None  ____________________________________________   INITIAL IMPRESSION / ASSESSMENT AND PLAN / ED COURSE  Pertinent labs & imaging results that were available during my care of the patient were reviewed by me and considered in my medical decision making (see chart for  details).  Patient here with her chronic feeling of lightheadedness, vital signs are reassuring blood work is reassuring urinalysis is reassuring EKG is reassuring she is had extensive imaging including MRIs and CT scans of her chest etc. and at this time I do not see any indication for any of that.  Discussed with the patient, her goals are not to feel well because she actually feels like this every day all the time, she wants to feel slightly better than she  does now, she feels with IV fluid and antiemetics she will.  Her allergy profile is noted she has considerable numbers of allergies and intolerances to medications but she is not allergic she states Zofran and we will give her Zofran IV fluid nothing to suggest infection, ACS PE dissection GI bleed AAA or other acute pathology causing her to feel lightheaded today    ____________________________________________   FINAL CLINICAL IMPRESSION(S) / ED DIAGNOSES  Final diagnoses:  None      This chart was dictated using voice recognition software.  Despite best efforts to proofread,  errors can occur which can change meaning.      Schuyler Amor, MD 12/09/17 [phone]

## 2017-12-26 DIAGNOSIS — F411 Generalized anxiety disorder: Secondary | ICD-10-CM | POA: Diagnosis not present

## 2017-12-26 DIAGNOSIS — F3162 Bipolar disorder, current episode mixed, moderate: Secondary | ICD-10-CM | POA: Diagnosis not present

## 2017-12-30 ENCOUNTER — Other Ambulatory Visit: Payer: Self-pay | Admitting: Family Medicine

## 2018-01-23 DIAGNOSIS — F331 Major depressive disorder, recurrent, moderate: Secondary | ICD-10-CM | POA: Diagnosis not present

## 2018-01-28 ENCOUNTER — Other Ambulatory Visit: Payer: Self-pay | Admitting: Family Medicine

## 2018-02-21 DIAGNOSIS — F3162 Bipolar disorder, current episode mixed, moderate: Secondary | ICD-10-CM | POA: Diagnosis not present

## 2018-03-09 ENCOUNTER — Ambulatory Visit: Payer: PPO | Admitting: Family Medicine

## 2018-03-23 DIAGNOSIS — F3162 Bipolar disorder, current episode mixed, moderate: Secondary | ICD-10-CM | POA: Diagnosis not present

## 2018-03-27 ENCOUNTER — Ambulatory Visit (INDEPENDENT_AMBULATORY_CARE_PROVIDER_SITE_OTHER): Payer: PPO | Admitting: Family Medicine

## 2018-03-27 VITALS — BP 106/68 | HR 99 | Temp 98.5°F | Ht 69.0 in | Wt 152.8 lb

## 2018-03-27 DIAGNOSIS — I951 Orthostatic hypotension: Secondary | ICD-10-CM

## 2018-03-27 DIAGNOSIS — E559 Vitamin D deficiency, unspecified: Secondary | ICD-10-CM | POA: Diagnosis not present

## 2018-03-27 DIAGNOSIS — R251 Tremor, unspecified: Secondary | ICD-10-CM | POA: Diagnosis not present

## 2018-03-27 DIAGNOSIS — G90A Postural orthostatic tachycardia syndrome (POTS): Secondary | ICD-10-CM

## 2018-03-27 DIAGNOSIS — F310 Bipolar disorder, current episode hypomanic: Secondary | ICD-10-CM | POA: Diagnosis not present

## 2018-03-27 DIAGNOSIS — E538 Deficiency of other specified B group vitamins: Secondary | ICD-10-CM

## 2018-03-27 DIAGNOSIS — R000 Tachycardia, unspecified: Secondary | ICD-10-CM

## 2018-03-27 DIAGNOSIS — R5383 Other fatigue: Secondary | ICD-10-CM | POA: Diagnosis not present

## 2018-03-27 DIAGNOSIS — M797 Fibromyalgia: Secondary | ICD-10-CM | POA: Diagnosis not present

## 2018-03-27 LAB — VITAMIN D 25 HYDROXY (VIT D DEFICIENCY, FRACTURES): VITD: 38.54 ng/mL (ref 30.00–100.00)

## 2018-03-27 LAB — VITAMIN B12: VITAMIN B 12: 1142 pg/mL — AB (ref 211–911)

## 2018-03-27 MED ORDER — CYCLOBENZAPRINE HCL 10 MG PO TABS: ORAL_TABLET | ORAL | 1 refills | Status: DC

## 2018-03-27 MED ORDER — VERAPAMIL HCL ER 120 MG PO TBCR: 120.0000 mg | EXTENDED_RELEASE_TABLET | Freq: Every day | ORAL | 3 refills | Status: DC

## 2018-03-27 NOTE — Assessment & Plan Note (Signed)
Likely multifactorial- possible contributors could be her fibromyalgia and some dysthymia.  She also has a history of Vit D deficiency and Vit B12 deficiency. Check Vit D and Vit B12 today. Advised her to also call her psychiatrist.

## 2018-03-27 NOTE — Assessment & Plan Note (Signed)
Recheck Vit D today. 

## 2018-03-27 NOTE — Patient Instructions (Addendum)
Great to see you.  Please call you psychiatrist to ask about decreasing your dose of Wellbutrin.  I will call you with your lab results from today and you can view them online.

## 2018-03-27 NOTE — Progress Notes (Signed)
Subjective:   Patient ID: Melissa [name], female    DOB: 1970-07-03, 47 y.o.   MRN: [id number]  Melissa [name] is a pleasant 47 y.o. year old female who presents to clinic today with Follow-up (last visit 6.13.19 pt was responding well to dosage of Flexeril so this was increased. Would look into massage therapy @[facility]  and see if insurance will cover. Then on 6.21.19 went to [facility] for syncopal episode.)  on 03/27/2018  HPI:   Bipolar- followed by psychiatrist in [place]. Currently taking Fluvox 75 mg nightly and wellbutrin 300 XL every morning. Wellbutrin increased from 150 two months ago.  Started having tremors once she increased it.  These tremors are similar to tremors she experienced with lithium. Did not notice any improvement in her anhedonia with increased dose of wellbutrin but does not feel depressed.  Tremors are impacting her quality of life as she cannot knit or do other things that she enjoys.  Fibromyalgia- feels flexeril is really helping. She has been more fatigued lately.  POTS- followed by Dr. Virl Axe. No recent dizziness.  Last seen on 12/09/17 at [facility] for POTS related syncopal episode.  Note reviewed.  Labs, EKG, vital signs were reassuring. Given zolfran and IV fluids in the ER. No current dizziness.  Has been staying hydrated.   Current Outpatient Medications on File Prior to Visit  Medication Sig Dispense Refill  . acetaminophen (TYLENOL) 500 MG tablet Take 1,000 mg by mouth daily as needed for mild pain.    Marland Kitchen buPROPion (WELLBUTRIN XL) 300 MG 24 hr tablet     . cetirizine (ZYRTEC) 10 MG tablet Take 10 mg by mouth daily.    . cholecalciferol (VITAMIN D) 1000 units tablet Take 5,000 Units by mouth daily.    . clonazePAM (KLONOPIN) 1 MG tablet Take 1 tablet (1 mg total) by mouth 2 (two) times daily as needed. for anxiety 60 tablet 0  . diclofenac sodium (VOLTAREN) 1 % GEL Apply to affected area three to four times daily. 100 g 0  . fluvoxaMINE (LUVOX) 50 MG  tablet Take 50-100 mg by mouth at bedtime.     . gabapentin (NEURONTIN) 600 MG tablet Take 1 tablet (600 mg total) by mouth 3 (three) times daily. 90 tablet 3  . HYDROcodone-acetaminophen (NORCO) 5-325 MG tablet Take 1 tablet by mouth every 6 (six) hours as needed for moderate pain. 60 tablet 0  . norgestimate-ethinyl estradiol (ORTHO-CYCLEN,SPRINTEC,PREVIFEM) 0.25-35 MG-MCG tablet Take 1 tablet by mouth daily. 1 Package 11  . ondansetron (ZOFRAN-ODT) 4 MG disintegrating tablet Take 1 tablet (4 mg total) by mouth every 8 (eight) hours as needed for nausea or vomiting. 20 tablet 5  . ranitidine (ZANTAC) 150 MG tablet Take 150 mg by mouth daily.    . vitamin B-12 (CYANOCOBALAMIN) 1000 MCG tablet Take 1,000 mcg by mouth daily.     No current facility-administered medications on file prior to visit.     Allergies  Allergen Reactions  . Abilify [Aripiprazole] Other (See Comments)    Weight gain, did not help,   . Depakote [Divalproex Sodium] Other (See Comments)    Hair loss  . Erythromycin Base Swelling  . Lamotrigine Other (See Comments)    Weight gain, angioedema, hives  . Seroquel [Quetiapine Fumarate] Other (See Comments)    irritable  . Tetanus Toxoids     Other reaction(s): OTHER  . Tramadol   . Ziprasidone Hcl Other (See Comments)    Heat/cold intolerance.   Marland Kitchen  Azithromycin Rash  . Lithium Rash  . Penicillins Rash    Has patient had a PCN reaction causing immediate rash, facial/tongue/throat swelling, SOB or lightheadedness with hypotension: Yes- swelling, rash  Has patient had a PCN reaction causing severe rash involving mucus membranes or skin necrosis: Yes Has patient had a PCN reaction that required hospitalization No Has patient had a PCN reaction occurring within the last 10 years: No If all of the above answers are "NO", then may proceed with Cephalosporin use.   . Pregabalin Palpitations    Manic symptoms, no sleep x's 3 days, no appetite.  . Sulfa Antibiotics Rash     Past Medical History:  Diagnosis Date  . Arthritis   . Bipolar disorder ([place])   . Fibromyalgia    on disability  . Migraine   . Muscle spasm   . Pain of right side of body    chronic  . Panic attack   . POTS (postural orthostatic tachycardia syndrome)   . Syncope    recurrent  . Uterine perforation    hx of    Past Surgical History:  Procedure Laterality Date  . CATARACT EXTRACTION  dec 2014  . DILATION AND CURETTAGE OF UTERUS    . LASIK  dec 2014    Family History  Problem Relation Age of Onset  . Diabetes Mother   . Hypertension Mother   . Cancer Father        brain tumor  . Cancer Maternal Grandmother   . Cancer Maternal Grandfather   . Cancer Paternal Grandmother   . Cancer Paternal Grandfather   . Migraines Neg Hx     Social History   Socioeconomic History  . Marital status: Married    Spouse name: Izell Allgood  . Number of children: 0  . Years of education: HS/college  . Highest education level: Not on file  Occupational History    Employer: OTHER    Comment: disabled  Social Needs  . Financial resource strain: Not on file  . Food insecurity:    Worry: Not on file    Inability: Not on file  . Transportation needs:    Medical: Not on file    Non-medical: Not on file  Tobacco Use  . Smoking status: Never Smoker  . Smokeless tobacco: Never Used  Substance and Sexual Activity  . Alcohol use: No    Alcohol/week: 0.0 standard drinks    Comment: 1 holiday drink yearly  . Drug use: No  . Sexual activity: Yes    Birth control/protection: Injection  Lifestyle  . Physical activity:    Days per week: Not on file    Minutes per session: Not on file  . Stress: Not on file  Relationships  . Social connections:    Talks on phone: Not on file    Gets together: Not on file    Attends religious service: Not on file    Active member of club or organization: Not on file    Attends meetings of clubs or organizations: Not on file    Relationship status:  Not on file  . Intimate partner violence:    Fear of current or ex partner: Not on file    Emotionally abused: Not on file    Physically abused: Not on file    Forced sexual activity: Not on file  Other Topics Concern  . Not on file  Social History Narrative   Patient is married Izell [place]) and lives at home with her  husband.   Married 20+ years   From [place]   On disability for fibromyalgia   Caffeine Use: none    The PMH, PSH, Social History, Family History, Medications, and allergies have been reviewed in [facility], and have been updated if relevant.   Review of Systems  Constitutional: Positive for fatigue.  HENT: Negative.   Eyes: Negative.   Respiratory: Negative.   Cardiovascular: Negative.   Gastrointestinal: Negative.   Endocrine: Negative.   Genitourinary: Negative.   Musculoskeletal: Positive for myalgias.  Skin: Negative.   Neurological: Positive for tremors. Negative for dizziness, facial asymmetry, light-headedness and headaches.  Hematological: Negative.   Psychiatric/Behavioral: Negative for self-injury and suicidal ideas. The patient is not nervous/anxious.   All other systems reviewed and are negative.      Objective:    BP 106/68 (BP Location: Right Arm, Patient Position: Sitting, Cuff Size: Normal)   Pulse 99   Temp 98.5 F (36.9 C) (Oral)   Ht 5\' 9"  (1.753 m)   Wt 152 lb 12.8 oz (69.3 kg)   SpO2 97%   BMI 22.56 kg/m    Physical Exam  Constitutional: She is oriented to person, place, and time. She appears well-developed and well-nourished. No distress.  HENT:  Head: Normocephalic and atraumatic.  Eyes: EOM are normal.  Neck: Normal range of motion.  Cardiovascular: Regular rhythm.  Pulmonary/Chest: Effort normal and breath sounds normal.  Musculoskeletal: Normal range of motion. She exhibits no edema or deformity.  Neurological: She is alert and oriented to person, place, and time. She displays tremor. No cranial nerve deficit.  Skin: Skin is warm  and dry.  Psychiatric: She has a normal mood and affect. Her behavior is normal. Judgment and thought content normal.  Nursing note and vitals reviewed.         Assessment & Plan:   Bipolar affective disorder, current episode hypomanic (HCC)  POTS (postural orthostatic tachycardia syndrome)  Fibromyalgia  Vitamin D deficiency - Plan: Vitamin D (25 hydroxy)  Vitamin B12 deficiency - Plan: B12  Tremor of both hands No follow-ups on file.

## 2018-03-27 NOTE — Assessment & Plan Note (Signed)
New and significant- started after she increased wellbutrin, so likely caused by Wellbutrin.  Tremor is impacting her QOL without improvement in her anhedonia with increased Wellbutrin dose.  Advised that she call her psychiatrist ASAP to discuss this and possibly decrease her dose of wellbutrin XL back to 150 mg daily. The patient indicates understanding of these issues and agrees with the plan. Call or return to clinic prn if these symptoms worsen or fail to improve as anticipated.

## 2018-04-18 DIAGNOSIS — F3162 Bipolar disorder, current episode mixed, moderate: Secondary | ICD-10-CM | POA: Diagnosis not present

## 2018-06-24 ENCOUNTER — Other Ambulatory Visit: Payer: Self-pay | Admitting: Family Medicine

## 2018-06-26 DIAGNOSIS — F3162 Bipolar disorder, current episode mixed, moderate: Secondary | ICD-10-CM | POA: Diagnosis not present

## 2018-06-29 ENCOUNTER — Ambulatory Visit (INDEPENDENT_AMBULATORY_CARE_PROVIDER_SITE_OTHER): Payer: PPO | Admitting: Family Medicine

## 2018-06-29 ENCOUNTER — Ambulatory Visit (INDEPENDENT_AMBULATORY_CARE_PROVIDER_SITE_OTHER): Payer: PPO

## 2018-06-29 ENCOUNTER — Encounter: Payer: Self-pay | Admitting: Family Medicine

## 2018-06-29 VITALS — BP 96/64 | HR 92 | Temp 98.4°F | Ht 69.0 in | Wt 156.6 lb

## 2018-06-29 DIAGNOSIS — M25561 Pain in right knee: Secondary | ICD-10-CM

## 2018-06-29 DIAGNOSIS — I951 Orthostatic hypotension: Secondary | ICD-10-CM

## 2018-06-29 DIAGNOSIS — R000 Tachycardia, unspecified: Secondary | ICD-10-CM

## 2018-06-29 DIAGNOSIS — G90A Postural orthostatic tachycardia syndrome (POTS): Secondary | ICD-10-CM

## 2018-06-29 MED ORDER — CYCLOBENZAPRINE HCL 10 MG PO TABS: ORAL_TABLET | ORAL | 5 refills | Status: DC

## 2018-06-29 NOTE — Progress Notes (Signed)
Subjective:   Patient ID: Melissa [name], female    DOB: [birth year], 48 y.o.   MRN: [id number]  Melissa [name] is a pleasant 48 y.o. year old female who presents to clinic today with Follow-up (Patient is here today to F/U with POTS.  ) and Knee Pain (She is C/O right knee pain.  It presented 69-months-ago while he was working at a job standing on cement floors.  It has never gotten better.  She denies any edema, has full ROM, only hurts when walking or climbing steps or going from sitting to standing.)  on 06/29/2018  HPI:   SUBJECTIVE: Melissa [name] is a 48 y.o. female compplained of right knee pain for  right knee injury 8  month(s) ago. Mechanism of injury: no known injury. Pain is owrse when she goes up a steep flight of stairs, it seems worsen.. Immediate symptoms: immediate pain. Symptoms have been paroxysmal since that time. Prior history of related problems: no prior problems with this area in the past.   Current Outpatient Medications on File Prior to Visit  Medication Sig Dispense Refill  . acetaminophen (TYLENOL) 500 MG tablet Take 1,000 mg by mouth daily as needed for mild pain.    Marland Kitchen buPROPion (WELLBUTRIN XL) 150 MG 24 hr tablet Take 150 mg by mouth daily.    . cetirizine (ZYRTEC) 10 MG tablet Take 10 mg by mouth daily.    . cholecalciferol (VITAMIN D) 1000 units tablet Take 5,000 Units by mouth daily.    . clonazePAM (KLONOPIN) 1 MG tablet Take 1 tablet (1 mg total) by mouth 2 (two) times daily as needed. for anxiety 60 tablet 0  . diclofenac sodium (VOLTAREN) 1 % GEL Apply to affected area three to four times daily. 100 g 0  . fluvoxaMINE (LUVOX) 50 MG tablet Take 50-100 mg by mouth at bedtime.     . gabapentin (NEURONTIN) 600 MG tablet Take 1 tablet (600 mg total) by mouth 3 (three) times daily. 90 tablet 3  . HYDROcodone-acetaminophen (NORCO) 5-325 MG tablet Take 1 tablet by mouth every 6 (six) hours as needed for moderate pain. 60 tablet 0  . norgestimate-ethinyl estradiol  (ORTHO-CYCLEN,SPRINTEC,PREVIFEM) 0.25-35 MG-MCG tablet Take 1 tablet by mouth daily. 1 Package 11  . ondansetron (ZOFRAN-ODT) 4 MG disintegrating tablet Take 1 tablet (4 mg total) by mouth every 8 (eight) hours as needed for nausea or vomiting. 20 tablet 5  . verapamil (CALAN-SR) 120 MG CR tablet Take 1 tablet (120 mg total) by mouth at bedtime. 30 tablet 3  . vitamin B-12 (CYANOCOBALAMIN) 1000 MCG tablet Take 1,000 mcg by mouth daily.     No current facility-administered medications on file prior to visit.     Allergies  Allergen Reactions  . Abilify [Aripiprazole] Other (See Comments)    Weight gain, did not help,   . Depakote [Divalproex Sodium] Other (See Comments)    Hair loss  . Erythromycin Base Swelling  . Lamotrigine Other (See Comments)    Weight gain, angioedema, hives  . Seroquel [Quetiapine Fumarate] Other (See Comments)    irritable  . Tetanus Toxoids     Other reaction(s): OTHER  . Tramadol   . Ziprasidone Hcl Other (See Comments)    Heat/cold intolerance.   . Azithromycin Rash  . Lithium Rash  . Penicillins Rash    Has patient had a PCN reaction causing immediate rash, facial/tongue/throat swelling, SOB or lightheadedness with hypotension: Yes- swelling, rash  Has patient had a PCN  reaction causing severe rash involving mucus membranes or skin necrosis: Yes Has patient had a PCN reaction that required hospitalization No Has patient had a PCN reaction occurring within the last 10 years: No If all of the above answers are "NO", then may proceed with Cephalosporin use.   . Pregabalin Palpitations    Manic symptoms, no sleep x's 3 days, no appetite.  . Sulfa Antibiotics Rash    Past Medical History:  Diagnosis Date  . Arthritis   . Bipolar disorder ([place])   . Fibromyalgia    on disability  . Migraine   . Muscle spasm   . Pain of right side of body    chronic  . Panic attack   . POTS (postural orthostatic tachycardia syndrome)   . Syncope    recurrent  .  Uterine perforation    hx of    Past Surgical History:  Procedure Laterality Date  . CATARACT EXTRACTION  dec 2014  . DILATION AND CURETTAGE OF UTERUS    . LASIK  dec 2014    Family History  Problem Relation Age of Onset  . Diabetes Mother   . Hypertension Mother   . Cancer Father        brain tumor  . Cancer Maternal Grandmother   . Cancer Maternal Grandfather   . Cancer Paternal Grandmother   . Cancer Paternal Grandfather   . Migraines Neg Hx     Social History   Socioeconomic History  . Marital status: Married    Spouse name: Izell [place]  . Number of children: 0  . Years of education: HS/college  . Highest education level: Not on file  Occupational History    Employer: OTHER    Comment: disabled  Social Needs  . Financial resource strain: Not on file  . Food insecurity:    Worry: Not on file    Inability: Not on file  . Transportation needs:    Medical: Not on file    Non-medical: Not on file  Tobacco Use  . Smoking status: Never Smoker  . Smokeless tobacco: Never Used  Substance and Sexual Activity  . Alcohol use: No    Alcohol/week: 0.0 standard drinks    Comment: 1 holiday drink yearly  . Drug use: No  . Sexual activity: Yes    Birth control/protection: Injection  Lifestyle  . Physical activity:    Days per week: Not on file    Minutes per session: Not on file  . Stress: Not on file  Relationships  . Social connections:    Talks on phone: Not on file    Gets together: Not on file    Attends religious service: Not on file    Active member of club or organization: Not on file    Attends meetings of clubs or organizations: Not on file    Relationship status: Not on file  . Intimate partner violence:    Fear of current or ex partner: Not on file    Emotionally abused: Not on file    Physically abused: Not on file    Forced sexual activity: Not on file  Other Topics Concern  . Not on file  Social History Narrative   Patient is married Izell [place]) and  lives at home with her husband.   Married 20+ years   From [place]   On disability for fibromyalgia   Caffeine Use: none   'The PMH, PSH, Social History, Family History, Medications, and allergies have been reviewed in [place],  and have been updated if relevant.  OBJECTIVE:  BP 96/64 (BP Location: Left Arm, Patient Position: Sitting, Cuff Size: Normal)   Pulse 92   Temp 98.4 F (36.9 C) (Oral)   Ht 5\' 9"  (1.753 m)   Wt 156 lb 9.6 oz (71 kg)   LMP 06/22/2018   SpO2 97%   BMI 23.13 kg/m   Vital signs as noted above. Appearance: alert, well appearing, and in no distress, oriented to person, place, and time, normal appearing weight and acyanotic, in no respiratory distress. Knee exam: collateral ligaments intact, negative McMurray sign. X-ray: ordered, but results not yet available.  ASSESSMENT: Knee internal derangement  PLAN: Xray today rest the injured area as much as practical See orders for this visit as documented in the electronic medical record.     Current Outpatient Medications on File Prior to Visit  Medication Sig Dispense Refill  . acetaminophen (TYLENOL) 500 MG tablet Take 1,000 mg by mouth daily as needed for mild pain.    Marland Kitchen buPROPion (WELLBUTRIN XL) 150 MG 24 hr tablet Take 150 mg by mouth daily.    . cetirizine (ZYRTEC) 10 MG tablet Take 10 mg by mouth daily.    . cholecalciferol (VITAMIN D) 1000 units tablet Take 5,000 Units by mouth daily.    . clonazePAM (KLONOPIN) 1 MG tablet Take 1 tablet (1 mg total) by mouth 2 (two) times daily as needed. for anxiety 60 tablet 0  . diclofenac sodium (VOLTAREN) 1 % GEL Apply to affected area three to four times daily. 100 g 0  . fluvoxaMINE (LUVOX) 50 MG tablet Take 50-100 mg by mouth at bedtime.     . gabapentin (NEURONTIN) 600 MG tablet Take 1 tablet (600 mg total) by mouth 3 (three) times daily. 90 tablet 3  . HYDROcodone-acetaminophen (NORCO) 5-325 MG tablet Take 1 tablet by mouth every 6 (six) hours as needed for  moderate pain. 60 tablet 0  . norgestimate-ethinyl estradiol (ORTHO-CYCLEN,SPRINTEC,PREVIFEM) 0.25-35 MG-MCG tablet Take 1 tablet by mouth daily. 1 Package 11  . ondansetron (ZOFRAN-ODT) 4 MG disintegrating tablet Take 1 tablet (4 mg total) by mouth every 8 (eight) hours as needed for nausea or vomiting. 20 tablet 5  . verapamil (CALAN-SR) 120 MG CR tablet Take 1 tablet (120 mg total) by mouth at bedtime. 30 tablet 3  . vitamin B-12 (CYANOCOBALAMIN) 1000 MCG tablet Take 1,000 mcg by mouth daily.     No current facility-administered medications on file prior to visit.     Allergies  Allergen Reactions  . Abilify [Aripiprazole] Other (See Comments)    Weight gain, did not help,   . Depakote [Divalproex Sodium] Other (See Comments)    Hair loss  . Erythromycin Base Swelling  . Lamotrigine Other (See Comments)    Weight gain, angioedema, hives  . Seroquel [Quetiapine Fumarate] Other (See Comments)    irritable  . Tetanus Toxoids     Other reaction(s): OTHER  . Tramadol   . Ziprasidone Hcl Other (See Comments)    Heat/cold intolerance.   . Azithromycin Rash  . Lithium Rash  . Penicillins Rash    Has patient had a PCN reaction causing immediate rash, facial/tongue/throat swelling, SOB or lightheadedness with hypotension: Yes- swelling, rash  Has patient had a PCN reaction causing severe rash involving mucus membranes or skin necrosis: Yes Has patient had a PCN reaction that required hospitalization No Has patient had a PCN reaction occurring within the last 10 years: No If all of the above  answers are "NO", then may proceed with Cephalosporin use.   . Pregabalin Palpitations    Manic symptoms, no sleep x's 3 days, no appetite.  . Sulfa Antibiotics Rash    Past Medical History:  Diagnosis Date  . Arthritis   . Bipolar disorder ([place])   . Fibromyalgia    on disability  . Migraine   . Muscle spasm   . Pain of right side of body    chronic  . Panic attack   . POTS (postural  orthostatic tachycardia syndrome)   . Syncope    recurrent  . Uterine perforation    hx of    Past Surgical History:  Procedure Laterality Date  . CATARACT EXTRACTION  dec 2014  . DILATION AND CURETTAGE OF UTERUS    . LASIK  dec 2014    Family History  Problem Relation Age of Onset  . Diabetes Mother   . Hypertension Mother   . Cancer Father        brain tumor  . Cancer Maternal Grandmother   . Cancer Maternal Grandfather   . Cancer Paternal Grandmother   . Cancer Paternal Grandfather   . Migraines Neg Hx     Social History   Socioeconomic History  . Marital status: Married    Spouse name: Izell [place]  . Number of children: 0  . Years of education: HS/college  . Highest education level: Not on file  Occupational History    Employer: OTHER    Comment: disabled  Social Needs  . Financial resource strain: Not on file  . Food insecurity:    Worry: Not on file    Inability: Not on file  . Transportation needs:    Medical: Not on file    Non-medical: Not on file  Tobacco Use  . Smoking status: Never Smoker  . Smokeless tobacco: Never Used  Substance and Sexual Activity  . Alcohol use: No    Alcohol/week: 0.0 standard drinks    Comment: 1 holiday drink yearly  . Drug use: No  . Sexual activity: Yes    Birth control/protection: Injection  Lifestyle  . Physical activity:    Days per week: Not on file    Minutes per session: Not on file  . Stress: Not on file  Relationships  . Social connections:    Talks on phone: Not on file    Gets together: Not on file    Attends religious service: Not on file    Active member of club or organization: Not on file    Attends meetings of clubs or organizations: Not on file    Relationship status: Not on file  . Intimate partner violence:    Fear of current or ex partner: Not on file    Emotionally abused: Not on file    Physically abused: Not on file    Forced sexual activity: Not on file  Other Topics Concern  . Not on  file  Social History Narrative   Patient is married Izell [place]) and lives at home with her husband.   Married 20+ years   From [place]   On disability for fibromyalgia   Caffeine Use: none    The PMH, PSH, Social History, Family History, Medications, and allergies have been reviewed in [facility], and have been updated if relevant.   Review of Systems     Objective:    BP 96/64 (BP Location: Left Arm, Patient Position: Sitting, Cuff Size: Normal)   Pulse 92   Temp  98.4 F (36.9 C) (Oral)   Ht 5\' 9"  (1.753 m)   Wt 156 lb 9.6 oz (71 kg)   LMP 06/22/2018   SpO2 97%   BMI 23.13 kg/m    Physical Exam        Assessment & Plan:   No diagnosis found. No follow-ups on file.

## 2018-07-02 ENCOUNTER — Encounter: Payer: Self-pay | Admitting: Emergency Medicine

## 2018-07-02 ENCOUNTER — Other Ambulatory Visit: Payer: Self-pay

## 2018-07-02 ENCOUNTER — Emergency Department
Admission: EM | Admit: 2018-07-02 | Discharge: 2018-07-02 | Disposition: A | Discharge status: Home / Self Care | Payer: PPO | Attending: Emergency Medicine | Admitting: Emergency Medicine

## 2018-07-02 DIAGNOSIS — Z79899 Other long term (current) drug therapy: Secondary | ICD-10-CM | POA: Diagnosis not present

## 2018-07-02 DIAGNOSIS — G43001 Migraine without aura, not intractable, with status migrainosus: Secondary | ICD-10-CM | POA: Diagnosis not present

## 2018-07-02 DIAGNOSIS — I10 Essential (primary) hypertension: Secondary | ICD-10-CM | POA: Diagnosis not present

## 2018-07-02 DIAGNOSIS — R51 Headache: Secondary | ICD-10-CM | POA: Diagnosis present

## 2018-07-02 LAB — POCT PREGNANCY, URINE: PREG TEST UR: NEGATIVE

## 2018-07-02 MED ORDER — METOCLOPRAMIDE HCL 5 MG/ML IJ SOLN
10.0000 mg | Freq: Once | INTRAMUSCULAR | Status: AC
Start: 2018-07-02 — End: 2018-07-02
  Administered 2018-07-02: 10 mg via INTRAVENOUS
  Filled 2018-07-02: qty 2

## 2018-07-02 MED ORDER — KETOROLAC TROMETHAMINE 30 MG/ML IJ SOLN
15.0000 mg | Freq: Once | INTRAMUSCULAR | Status: AC
  Administered 2018-07-02: 15 mg via INTRAVENOUS
  Filled 2018-07-02: qty 1

## 2018-07-02 MED ORDER — SODIUM CHLORIDE 0.9 % IV BOLUS
1000.0000 mL | Freq: Once | INTRAVENOUS | Status: AC
  Administered 2018-07-02: 1000 mL via INTRAVENOUS

## 2018-07-02 MED ORDER — DIPHENHYDRAMINE HCL 50 MG/ML IJ SOLN
25.0000 mg | Freq: Once | INTRAMUSCULAR | Status: AC
  Administered 2018-07-02: 25 mg via INTRAVENOUS
  Filled 2018-07-02: qty 1

## 2018-07-02 NOTE — ED Notes (Signed)
To room to reassess pt, obtain final VS and discharge pt. Pt not in room, IV hanging on pole, no belongings in room.  Pt left without discharge.

## 2018-07-02 NOTE — ED Triage Notes (Signed)
Headache x3 days , hx of same.  Unable to get relief.  Decrease fluid intake due to nausea.

## 2018-07-02 NOTE — ED Provider Notes (Signed)
[facility] Emergency Department Provider Note  ____________________________________________  Time seen: Approximately 12:17 PM  I have reviewed the triage vital signs and the nursing notes.   HISTORY  Chief Complaint Headache   HPI Melissa [name] is a 48 y.o. female with a history of bipolar, fibromyalgia, migraines, and POTS who presents for evaluation of migraine headache.  Patient takes over-the-counter and sumatriptan at home for her migraines.  This one started 3 days ago.  Headache is identical to prior migraines.  Started mild and became progressively worse over the course of the first day.  No thunderclap headache.  Patient has phonophobia.  No photophobia or visual aura.  No neck stiffness or fever, no changes in vision, no unilateral weakness or numbness, no facial droop.  Currently headaches 8 out of 10.   Past Medical History:  Diagnosis Date  . Arthritis   . Bipolar disorder ([place])   . Fibromyalgia    on disability  . Migraine   . Muscle spasm   . Pain of right side of body    chronic  . Panic attack   . POTS (postural orthostatic tachycardia syndrome)   . Syncope    recurrent  . Uterine perforation    hx of    Patient Active Problem List   Diagnosis Date Noted  . Tremor of both hands 03/27/2018  . Trapezius muscle spasm 12/01/2017  . Syncopal episodes 09/10/2017  . HTN (hypertension) 05/09/2017  . Transient elevated blood pressure 04/26/2017  . Dizziness 04/18/2017  . Visual changes 04/18/2017  . Menorrhagia 11/11/2016  . Alopecia 03/02/2016  . Bilateral cataracts 08/14/2015  . Acne 10/21/2014  . Liver lesion 09/09/2014  . Vitamin D deficiency 09/09/2014  . Hot flashes 06/10/2014  . Chronic pain syndrome 05/20/2014  . Vitamin B12 deficiency 03/12/2014  . Contraception management 09/20/2013  . IBS (irritable bowel syndrome) 09/20/2013  . Unspecified vitamin D deficiency 04/03/2013  . HLD (hyperlipidemia) 04/03/2013  .  Osteopenia 04/03/2013  . POTS (postural orthostatic tachycardia syndrome) 02/26/2013  . Migraine 11/29/2012  . Allergic rhinitis 10/05/2012  . Mixed bipolar I disorder in remission ([place]) 10/05/2012  . Combined fat and carbohydrate induced hyperlipemia 10/05/2012  . Fibromyalgia   . Bipolar disorder ([place])   . Fatigue 09/10/2011  . Clinical depression 09/10/2011  . Acid reflux 09/10/2011  . Non-restorative sleep 09/10/2011  . Disturbance in sleep behavior 09/10/2011    Past Surgical History:  Procedure Laterality Date  . CATARACT EXTRACTION  dec 2014  . DILATION AND CURETTAGE OF UTERUS    . LASIK  dec 2014    Prior to Admission medications   Medication Sig Start Date End Date Taking? Authorizing Provider  acetaminophen (TYLENOL) 500 MG tablet Take 1,000 mg by mouth daily as needed for mild pain.    [provider]  buPROPion (WELLBUTRIN XL) 150 MG 24 hr tablet Take 150 mg by mouth daily.    [provider]  cetirizine (ZYRTEC) 10 MG tablet Take 10 mg by mouth daily.    [provider]  cholecalciferol (VITAMIN D) 1000 units tablet Take 5,000 Units by mouth daily.    [provider]  clonazePAM (KLONOPIN) 1 MG tablet Take 1 tablet (1 mg total) by mouth 2 (two) times daily as needed. for anxiety 05/26/16   Lucille Passy, MD  cyclobenzaprine (FLEXERIL) 10 MG tablet Take 1 tid prn muscle spasms 06/29/18   Lucille Passy, MD  diclofenac sodium (VOLTAREN) 1 % GEL  Apply to affected area three to four times daily. 01/27/17   Lucille Passy, MD  fluvoxaMINE (LUVOX) 50 MG tablet Take 50-100 mg by mouth at bedtime.  05/23/17   [provider]  gabapentin (NEURONTIN) 600 MG tablet Take 1 tablet (600 mg total) by mouth 3 (three) times daily. 06/24/17   Lucille Passy, MD  HYDROcodone-acetaminophen (NORCO) 5-325 MG tablet Take 1 tablet by mouth every 6 (six) hours as needed for moderate pain. 12/01/17   Lucille Passy, MD  norgestimate-ethinyl estradiol  (ORTHO-CYCLEN,SPRINTEC,PREVIFEM) 0.25-35 MG-MCG tablet Take 1 tablet by mouth daily. 12/01/17   Lucille Passy, MD  ondansetron (ZOFRAN-ODT) 4 MG disintegrating tablet Take 1 tablet (4 mg total) by mouth every 8 (eight) hours as needed for nausea or vomiting. 05/26/17   Lucille Passy, MD  verapamil (CALAN-SR) 120 MG CR tablet Take 1 tablet (120 mg total) by mouth at bedtime. 03/27/18   Lucille Passy, MD  vitamin B-12 (CYANOCOBALAMIN) 1000 MCG tablet Take 1,000 mcg by mouth daily.    [provider]    Allergies Abilify [aripiprazole]; Depakote [divalproex sodium]; Erythromycin base; Lamotrigine; Seroquel [quetiapine fumarate]; Tetanus toxoids; Tramadol; Ziprasidone hcl; Azithromycin; Lithium; Penicillins; Pregabalin; and Sulfa antibiotics  Family History  Problem Relation Age of Onset  . Diabetes Mother   . Hypertension Mother   . Cancer Father        brain tumor  . Cancer Maternal Grandmother   . Cancer Maternal Grandfather   . Cancer Paternal Grandmother   . Cancer Paternal Grandfather   . Migraines Neg Hx     Social History Social History   Tobacco Use  . Smoking status: Never Smoker  . Smokeless tobacco: Never Used  Substance Use Topics  . Alcohol use: No    Alcohol/week: 0.0 standard drinks    Comment: 1 holiday drink yearly  . Drug use: No    Review of Systems  Constitutional: Negative for fever. Eyes: Negative for visual changes. ENT: Negative for sore throat. Neck: No neck pain  Cardiovascular: Negative for chest pain. Respiratory: Negative for shortness of breath. Gastrointestinal: Negative for abdominal pain, vomiting or diarrhea. Genitourinary: Negative for dysuria. Musculoskeletal: Negative for back pain. Skin: Negative for rash. Neurological: Negative for weakness or numbness. + HA Psych: No SI or HI  ____________________________________________   PHYSICAL EXAM:  VITAL SIGNS: ED Triage Vitals [07/02/18 1057]  Enc Vitals Group     BP (!)  108/57     Pulse Rate 99     Resp 18     Temp 98.2 F (36.8 C)     Temp Source Oral     SpO2 97 %     Weight 155 lb (70.3 kg)     Height 5\' 9"  (1.753 m)     Head Circumference      Peak Flow      Pain Score 6     Pain Loc      Pain Edu?      Excl. in [place]?     Constitutional: Alert and oriented, sitting in a dark room in no apparent distress. HEENT:      Head: Normocephalic and atraumatic.         Eyes: Conjunctivae are normal. Sclera is non-icteric.       Mouth/Throat: Mucous membranes are moist.       Neck: Supple with no signs of meningismus. Cardiovascular: Regular rate and rhythm. No murmurs, gallops, or rubs. 2+ symmetrical distal pulses are present  in all extremities. No JVD. Respiratory: Normal respiratory effort. Lungs are clear to auscultation bilaterally. No wheezes, crackles, or rhonchi.  Gastrointestinal: Soft, non tender, and non distended with positive bowel sounds. No rebound or guarding. Musculoskeletal: Nontender with normal range of motion in all extremities. No edema, cyanosis, or erythema of extremities. Neurologic: Normal speech and language. Face is symmetric. EOMI, PERRL. Moving all extremities. No gross focal neurologic deficits are appreciated. Skin: Skin is warm, dry and intact. No rash noted. Psychiatric: Mood and affect are normal. Speech and behavior are normal.  ____________________________________________   LABS (all labs ordered are listed, but only abnormal results are displayed)  Labs Reviewed  POCT PREGNANCY, URINE   ____________________________________________  EKG  none  ____________________________________________  RADIOLOGY  none  ____________________________________________   PROCEDURES  Procedure(s) performed: None Procedures Critical Care performed:  None ____________________________________________   INITIAL IMPRESSION / ASSESSMENT AND PLAN / ED COURSE  48 y.o. female with a history of bipolar, fibromyalgia,  migraines, and POTS who presents for evaluation of migraine headache.   Low suspicion for more serious or life threatening etiology of HA based on history and exam. No sudden onset thunderclap HA, onset with exertion, vomiting, focal neurologic deficits, to suggest increased risk of subarachnoid hemorrhage. No fever, neck pain, neck stiffness, or meningismus on exam to suggest meningitis. No fevers, altered mental status, unusual behavior to suggest encephalitis. No focal neurologic deficits by history or exam to suggest central venous thrombosis. No constitutional symptoms including fever, fatigue, weight loss, temporal scalp tenderness, jaw claudication, visual loss, to suggest temporal arteritis. No immunocompromise to suggest increased risk for intracranial infectious disease. No visual changes or findings on ocular exam to suggest acute angle closure glaucoma. No reports of toxic exposures including carbon monoxide or other household members with similar symptoms.  Will treat with migraine cocktail.   Clinical Course as of Jul 02 1432  Sun Jul 02, 2018  1433 Patient's headache is fully resolved.  She remains extremely well-appearing.  Tolerated p.o.  Will discharge home with supportive care and follow-up with primary care doctor.  Discussed and return precautions.   [CV]    Clinical Course User Index [CV] Alfred Levins [place], MD     As part of my medical decision making, I reviewed the following data within the [place] notes reviewed and incorporated, Old chart reviewed, Notes from prior ED visits and [place] Controlled Substance Database    Pertinent labs & imaging results that were available during my care of the patient were reviewed by me and considered in my medical decision making (see chart for details).    ____________________________________________   FINAL CLINICAL IMPRESSION(S) / ED DIAGNOSES  Final diagnoses:  Migraine without aura and with status  migrainosus, not intractable      NEW MEDICATIONS STARTED DURING THIS VISIT:  ED Discharge Orders    None       Note:  This document was prepared using Dragon voice recognition software and may include unintentional dictation errors.    Alfred Levins, [place], MD 07/02/18 1434

## 2018-08-08 ENCOUNTER — Encounter: Payer: Self-pay | Admitting: *Deleted

## 2018-08-08 ENCOUNTER — Telehealth: Payer: Self-pay | Admitting: Family Medicine

## 2018-08-08 NOTE — Telephone Encounter (Signed)
Pt reported to agent HR 128 this am fluctuating between 120-128. Sent to [place], pt declines triage. States this is not new issue, she needs her BP medication adjusted as in past.  Made aware Dr. Deborra Medina did not have availability today; offered to secure appt with another provider, declined. States she will send her a MyChart message to Dr. Deborra Medina.

## 2018-08-08 NOTE — Telephone Encounter (Signed)
This encounter was created in error - please disregard.

## 2018-08-09 NOTE — Telephone Encounter (Signed)
Conversed with spouse via MyChart and advised that Dr. Deborra Medina would like her to be seen in the office today/thx dmf

## 2018-08-17 ENCOUNTER — Other Ambulatory Visit: Payer: Self-pay | Admitting: Family Medicine

## 2018-09-16 ENCOUNTER — Other Ambulatory Visit: Payer: Self-pay

## 2018-09-16 ENCOUNTER — Emergency Department (HOSPITAL_COMMUNITY): Payer: No Typology Code available for payment source

## 2018-09-16 ENCOUNTER — Emergency Department (HOSPITAL_COMMUNITY)
Admission: EM | Admit: 2018-09-16 | Discharge: 2018-09-16 | Disposition: A | Discharge status: Home / Self Care | Payer: No Typology Code available for payment source | Attending: Emergency Medicine | Admitting: Emergency Medicine

## 2018-09-16 DIAGNOSIS — R079 Chest pain, unspecified: Secondary | ICD-10-CM | POA: Diagnosis not present

## 2018-09-16 DIAGNOSIS — Y9389 Activity, other specified: Secondary | ICD-10-CM | POA: Diagnosis not present

## 2018-09-16 DIAGNOSIS — M542 Cervicalgia: Secondary | ICD-10-CM | POA: Diagnosis present

## 2018-09-16 DIAGNOSIS — S0990XA Unspecified injury of head, initial encounter: Secondary | ICD-10-CM | POA: Diagnosis not present

## 2018-09-16 DIAGNOSIS — I10 Essential (primary) hypertension: Secondary | ICD-10-CM | POA: Diagnosis not present

## 2018-09-16 DIAGNOSIS — T07XXXA Unspecified multiple injuries, initial encounter: Secondary | ICD-10-CM

## 2018-09-16 DIAGNOSIS — Z79899 Other long term (current) drug therapy: Secondary | ICD-10-CM | POA: Diagnosis not present

## 2018-09-16 DIAGNOSIS — R109 Unspecified abdominal pain: Secondary | ICD-10-CM | POA: Diagnosis not present

## 2018-09-16 DIAGNOSIS — S300XXA Contusion of lower back and pelvis, initial encounter: Secondary | ICD-10-CM | POA: Diagnosis not present

## 2018-09-16 DIAGNOSIS — Y999 Unspecified external cause status: Secondary | ICD-10-CM | POA: Insufficient documentation

## 2018-09-16 DIAGNOSIS — S50312A Abrasion of left elbow, initial encounter: Secondary | ICD-10-CM

## 2018-09-16 DIAGNOSIS — Y929 Unspecified place or not applicable: Secondary | ICD-10-CM | POA: Insufficient documentation
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
## 2018-09-16 NOTE — Discharge Instructions (Addendum)
Get plenty of rest and drink a lot of fluids.  Use ice on the sore spots for a couple of days after that use heat.  Clean your left elbow wound 2 or 3 times a day to improve healing and prevent infection.  For pain use ibuprofen or Tylenol, and follow-up with your doctor if needed.
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
## 2018-09-20 DIAGNOSIS — F411 Generalized anxiety disorder: Secondary | ICD-10-CM | POA: Diagnosis not present

## 2018-09-20 DIAGNOSIS — F314 Bipolar disorder, current episode depressed, severe, without psychotic features: Secondary | ICD-10-CM | POA: Diagnosis not present

## 2018-09-25 ENCOUNTER — Ambulatory Visit: Payer: PPO | Admitting: Family Medicine

## 2018-09-28 ENCOUNTER — Ambulatory Visit: Payer: PPO | Admitting: Family Medicine

## 2018-10-03 ENCOUNTER — Other Ambulatory Visit: Payer: Self-pay

## 2018-10-03 MED ORDER — DICLOFENAC SODIUM 75 MG PO TBEC: 75.0000 mg | DELAYED_RELEASE_TABLET | Freq: Two times a day (BID) | ORAL | 3 refills | Status: DC

## 2018-10-03 NOTE — Progress Notes (Signed)
Yes I have sent in but if possible, can we please schedule a virtual visit for this?
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
## 2018-10-04 NOTE — Progress Notes (Signed)
Sent MyChart message to spouse asking if tomorrow at 9am is ok for a virtual visit/thx dmf

## 2018-10-07 DIAGNOSIS — Z09 Encounter for follow-up examination after completed treatment for conditions other than malignant neoplasm: Secondary | ICD-10-CM | POA: Insufficient documentation

## 2018-10-07 DIAGNOSIS — T07XXXA Unspecified multiple injuries, initial encounter: Secondary | ICD-10-CM | POA: Insufficient documentation
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
## 2018-10-09 ENCOUNTER — Encounter: Payer: Self-pay | Admitting: Family Medicine

## 2018-10-09 ENCOUNTER — Ambulatory Visit (INDEPENDENT_AMBULATORY_CARE_PROVIDER_SITE_OTHER): Payer: PPO | Admitting: Family Medicine

## 2018-10-09 VITALS — BP 129/83 | HR 100

## 2018-10-09 DIAGNOSIS — M797 Fibromyalgia: Secondary | ICD-10-CM | POA: Diagnosis not present

## 2018-10-09 DIAGNOSIS — T07XXXA Unspecified multiple injuries, initial encounter: Secondary | ICD-10-CM

## 2018-10-09 DIAGNOSIS — Z09 Encounter for follow-up examination after completed treatment for conditions other than malignant neoplasm: Secondary | ICD-10-CM
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
## 2018-10-24 ENCOUNTER — Other Ambulatory Visit: Payer: Self-pay

## 2018-10-24 MED ORDER — HYDROCODONE-ACETAMINOPHEN 5-325 MG PO TABS: 1.0000 | ORAL_TABLET | Freq: Four times a day (QID) | ORAL | 0 refills | Status: DC | PRN

## 2018-10-24 NOTE — Telephone Encounter (Signed)
TA-Pt requests refill for Hydrocodone/last filled on 6.13.2019/per [place] PMP pt is compliant without red flags/Rx is prepared and pending/thx dmf

## 2018-10-26 DIAGNOSIS — F3162 Bipolar disorder, current episode mixed, moderate: Secondary | ICD-10-CM | POA: Diagnosis not present

## 2018-10-26 DIAGNOSIS — F411 Generalized anxiety disorder: Secondary | ICD-10-CM | POA: Diagnosis not present

## 2018-11-02 ENCOUNTER — Ambulatory Visit: Payer: PPO | Admitting: Family Medicine

## 2018-11-12 DIAGNOSIS — R42 Dizziness and giddiness: Secondary | ICD-10-CM | POA: Diagnosis not present

## 2018-11-16 ENCOUNTER — Other Ambulatory Visit: Payer: Self-pay | Admitting: Family Medicine

## 2018-11-16 MED ORDER — ONDANSETRON 4 MG PO TBDP: 4.0000 mg | ORAL_TABLET | Freq: Three times a day (TID) | ORAL | 5 refills | Status: DC | PRN

## 2018-11-30 ENCOUNTER — Other Ambulatory Visit: Payer: Self-pay | Admitting: Family Medicine

## 2018-12-18 ENCOUNTER — Other Ambulatory Visit: Payer: Self-pay | Admitting: Family Medicine

## 2018-12-26 DIAGNOSIS — F411 Generalized anxiety disorder: Secondary | ICD-10-CM | POA: Diagnosis not present
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
## 2019-01-08 ENCOUNTER — Ambulatory Visit (INDEPENDENT_AMBULATORY_CARE_PROVIDER_SITE_OTHER): Payer: PPO | Admitting: Family Medicine

## 2019-01-08 DIAGNOSIS — F310 Bipolar disorder, current episode hypomanic: Secondary | ICD-10-CM | POA: Diagnosis not present

## 2019-01-08 DIAGNOSIS — E785 Hyperlipidemia, unspecified: Secondary | ICD-10-CM

## 2019-01-08 DIAGNOSIS — M8580 Other specified disorders of bone density and structure, unspecified site: Secondary | ICD-10-CM

## 2019-01-08 DIAGNOSIS — G90A Postural orthostatic tachycardia syndrome (POTS): Secondary | ICD-10-CM

## 2019-01-08 DIAGNOSIS — R251 Tremor, unspecified: Secondary | ICD-10-CM | POA: Diagnosis not present

## 2019-01-08 DIAGNOSIS — E559 Vitamin D deficiency, unspecified: Secondary | ICD-10-CM

## 2019-01-08 DIAGNOSIS — G894 Chronic pain syndrome: Secondary | ICD-10-CM

## 2019-01-08 DIAGNOSIS — M797 Fibromyalgia: Secondary | ICD-10-CM

## 2019-01-08 DIAGNOSIS — E538 Deficiency of other specified B group vitamins: Secondary | ICD-10-CM

## 2019-01-08 DIAGNOSIS — I951 Orthostatic hypotension: Secondary | ICD-10-CM | POA: Diagnosis not present

## 2019-01-08 DIAGNOSIS — R000 Tachycardia, unspecified: Secondary | ICD-10-CM

## 2019-01-08 MED ORDER — GABAPENTIN 600 MG PO TABS: 600.0000 mg | ORAL_TABLET | Freq: Three times a day (TID) | ORAL | 3 refills | Status: DC

## 2019-01-08 NOTE — Assessment & Plan Note (Signed)
Has been staying as hydrated as possible. Due to covid, staying in doors more which probably helps.  She has had a couple of dizzy spells but no syncope and drinks gatorate and water to stay hydrated.

## 2019-01-08 NOTE — Assessment & Plan Note (Signed)
Manage by psych. She feels her tremors are better with lower dose of Wellbutrin.  Continue to follow along with psych.

## 2019-01-08 NOTE — Assessment & Plan Note (Signed)
Continue current dose. We agree she could wait another 3 months to recheck this.

## 2019-01-08 NOTE — Assessment & Plan Note (Signed)
Improved with lower dose of Wellbutrin but she does still get the "ocassional shakes." She will continue to keep me updated.

## 2019-01-08 NOTE — Assessment & Plan Note (Signed)
Deteriorated but often does in the hot summer months. I have refilled her gabapentin.  She will keep me updated with her symptoms. Flexeril also helps. She is also taking daily Vitamin D.

## 2019-01-24 ENCOUNTER — Other Ambulatory Visit: Payer: Self-pay

## 2019-01-24 NOTE — Telephone Encounter (Signed)
TA-Pt is needing a refill of Hydrocodone-APAP/LOV: 7.20.20/per [place] PMP pt is compliant and without red flags/thx dmf

## 2019-01-25 MED ORDER — HYDROCODONE-ACETAMINOPHEN 5-325 MG PO TABS: 1.0000 | ORAL_TABLET | Freq: Four times a day (QID) | ORAL | 0 refills | Status: DC | PRN

## 2019-02-15 ENCOUNTER — Other Ambulatory Visit: Payer: Self-pay | Admitting: Family Medicine

## 2019-03-26 DIAGNOSIS — F411 Generalized anxiety disorder: Secondary | ICD-10-CM | POA: Diagnosis not present

## 2019-03-26 DIAGNOSIS — F3162 Bipolar disorder, current episode mixed, moderate: Secondary | ICD-10-CM | POA: Diagnosis not present

## 2019-06-07 ENCOUNTER — Other Ambulatory Visit: Payer: Self-pay

## 2019-06-07 MED ORDER — HYDROCODONE-ACETAMINOPHEN 5-325 MG PO TABS: 1.0000 | ORAL_TABLET | Freq: Four times a day (QID) | ORAL | 0 refills | Status: DC | PRN
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
## 2019-06-26 ENCOUNTER — Other Ambulatory Visit: Payer: Self-pay

## 2019-06-26 ENCOUNTER — Emergency Department
Admission: EM | Admit: 2019-06-26 | Discharge: 2019-06-26 | Disposition: A | Discharge status: Home / Self Care | Payer: Medicare HMO | Attending: Emergency Medicine | Admitting: Emergency Medicine

## 2019-06-26 ENCOUNTER — Encounter: Payer: Self-pay | Admitting: Emergency Medicine

## 2019-06-26 DIAGNOSIS — Z5321 Procedure and treatment not carried out due to patient leaving prior to being seen by health care provider: Secondary | ICD-10-CM | POA: Diagnosis not present

## 2019-06-26 DIAGNOSIS — R000 Tachycardia, unspecified: Secondary | ICD-10-CM | POA: Insufficient documentation

## 2019-06-26 LAB — URINALYSIS, COMPLETE (UACMP) WITH MICROSCOPIC
Bilirubin Urine: NEGATIVE
Glucose, UA: NEGATIVE mg/dL
Hgb urine dipstick: NEGATIVE
Ketones, ur: NEGATIVE mg/dL
Nitrite: NEGATIVE
Protein, ur: NEGATIVE mg/dL
Specific Gravity, Urine: 1.019 (ref 1.005–1.030)
pH: 5 (ref 5.0–8.0)

## 2019-06-26 LAB — CBC
HCT: 42.1 % (ref 36.0–46.0)
Hemoglobin: 13.8 g/dL (ref 12.0–15.0)
MCH: 28.6 pg (ref 26.0–34.0)
MCHC: 32.8 g/dL (ref 30.0–36.0)
MCV: 87.2 fL (ref 80.0–100.0)
Platelets: 293 K/uL (ref 150–400)
RBC: 4.83 MIL/uL (ref 3.87–5.11)
RDW: 12.9 % (ref 11.5–15.5)
WBC: 9.5 K/uL (ref 4.0–10.5)
nRBC: 0 % (ref 0.0–0.2)

## 2019-06-26 LAB — BASIC METABOLIC PANEL WITH GFR
Anion gap: 11 (ref 5–15)
BUN: 12 mg/dL (ref 6–20)
CO2: 23 mmol/L (ref 22–32)
Calcium: 8.8 mg/dL — ABNORMAL LOW (ref 8.9–10.3)
Chloride: 104 mmol/L (ref 98–111)
Creatinine, Ser: 0.94 mg/dL (ref 0.44–1.00)
GFR calc Af Amer: >60 mL/min
GFR calc non Af Amer: >60 mL/min
Glucose, Bld: 91 mg/dL (ref 70–99)
Potassium: 3.9 mmol/L (ref 3.5–5.1)
Sodium: 138 mmol/L (ref 135–145)

## 2019-06-26 LAB — TROPONIN I (HIGH SENSITIVITY): Troponin I (High Sensitivity): <2 ng/L (ref <18)

## 2019-06-26 LAB — PREGNANCY, URINE: Preg Test, Ur: NEGATIVE
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
## 2019-06-26 NOTE — ED Triage Notes (Signed)
FIRST NURSE NOTE- reports bp elevated and feeling shaky. Reports HR has been elevated. Pulled next for triage.

## 2019-06-26 NOTE — ED Triage Notes (Signed)
Pt in via POV, reports episodes of hypertension and tachycardia with any exertion, sometimes causing light headedness and near syncopal episodes. Symptoms going on x 2 days.  A/Ox4, NAD noted at this time.

## 2019-06-27 ENCOUNTER — Telehealth: Payer: Self-pay

## 2019-06-27 NOTE — Telephone Encounter (Signed)
She has a history of POTs and usually needs to go to ED for IV fluids.  So that does concern me and I would like her to be seen urgently.

## 2019-06-27 NOTE — Telephone Encounter (Signed)
I called pt in regards to a Mychart message that her husband left about pt's BP.   Pt explained that she went to the ER yesterday and they got some blood and urine. Pt said that she could not wait to be seen so she left the ER. Pt explains that she gets dizzy when standing up for too long and also vision starts to get blurry.  I offered pt to see another provider today as Dr. Deborra Medina has no openings today and she refused.  Pt also said that she could not see Dr. Deborra Medina tomorrow due to her husband having surgery.  Pt is scheduled to have a virtual visit Monday. Pt's BP today is 110/80 HR:104. I advised pt that if she has SOB or her symptoms worsen before her visit, to go back to the ER for evaluation.  Please advise if patient should have a in office visit or is the virtual visit scheduled ok???

## 2019-06-30 NOTE — Progress Notes (Signed)
Virtual Visit via Video   Due to the COVID-19 pandemic, this visit was completed with telemedicine (audio/video) technology to reduce patient and provider exposure as well as to preserve personal protective equipment.   I connected with [name] by a video enabled telemedicine application and verified that I am speaking with the correct person using two identifiers. Location patient: Home Location provider: [facility], Office Persons participating in the virtual visit: Nichola Sizer, MD   I discussed the limitations of evaluation and management by telemedicine and the availability of in person appointments. The patient expressed understanding and agreed to proceed.  Care Team   Patient Care Team: Lucille Passy, MD as PCP - General (Family Medicine)  Subjective:   HPI:  Pt agrees to virtual visit.   She complains of dizziness and shaky when sitting. When stands for too long she also has blurred vision. This am BP 143/83 P 111. Yesterday was 112/79 P 112. Saturday 143/87; 129/79; 99/43; 122/75. Went to [facility] last week which they did labs but pt had to leave due to friend death.   UA pos for moderate LE, rare bacteria.  . Review of Systems  Constitutional: Negative for fever and malaise/fatigue.  HENT: Negative for congestion and hearing loss.   Eyes: Positive for blurred vision. Negative for discharge and redness.  Respiratory: Negative for cough and shortness of breath.   Cardiovascular: Negative for chest pain, palpitations and leg swelling.  Gastrointestinal: Negative for abdominal pain and heartburn.  Genitourinary: Negative for dysuria.  Musculoskeletal: Negative for falls.  Skin: Negative for rash.  Neurological: Positive for dizziness. Negative for loss of consciousness and headaches.  Endo/Heme/Allergies: Does not bruise/bleed easily.  Psychiatric/Behavioral: Negative for depression and memory loss.     Patient Active Problem List   Diagnosis Date  Noted  . Possible urinary tract infection 07/02/2019  . Dizziness 07/02/2019  . Tremor of both hands 03/27/2018  . Syncopal episodes 09/10/2017  . HTN (hypertension) 05/09/2017  . Menorrhagia 11/11/2016  . Alopecia 03/02/2016  . Bilateral cataracts 08/14/2015  . Acne 10/21/2014  . Liver lesion 09/09/2014  . Vitamin D deficiency 09/09/2014  . Hot flashes 06/10/2014  . Chronic pain syndrome 05/20/2014  . Vitamin B12 deficiency 03/12/2014  . Contraception management 09/20/2013  . IBS (irritable bowel syndrome) 09/20/2013  . Unspecified vitamin D deficiency 04/03/2013  . HLD (hyperlipidemia) 04/03/2013  . Osteopenia 04/03/2013  . POTS (postural orthostatic tachycardia syndrome) 02/26/2013  . Migraine 11/29/2012  . Allergic rhinitis 10/05/2012  . Mixed bipolar I disorder in remission ([place]) 10/05/2012  . Combined fat and carbohydrate induced hyperlipemia 10/05/2012  . Fibromyalgia   . Bipolar disorder ([place])   . Fatigue 09/10/2011  . Clinical depression 09/10/2011  . Acid reflux 09/10/2011  . Non-restorative sleep 09/10/2011  . Disturbance in sleep behavior 09/10/2011    Social History   Tobacco Use  . Smoking status: Never Smoker  . Smokeless tobacco: Never Used  Substance Use Topics  . Alcohol use: No    Alcohol/week: 0.0 standard drinks    Current Outpatient Medications:  .  acetaminophen (TYLENOL) 500 MG tablet, Take 1,000 mg by mouth daily as needed for mild pain., Disp: , Rfl:  .  buPROPion (WELLBUTRIN XL) 150 MG 24 hr tablet, Take 150 mg by mouth daily., Disp: , Rfl:  .  cetirizine (ZYRTEC) 10 MG tablet, Take 10 mg by mouth daily., Disp: , Rfl:  .  cholecalciferol (VITAMIN D) 1000 units tablet, Take  5,000 Units by mouth daily., Disp: , Rfl:  .  clonazePAM (KLONOPIN) 1 MG tablet, Take 1 tablet (1 mg total) by mouth 2 (two) times daily as needed. for anxiety, Disp: 60 tablet, Rfl: 0 .  cyclobenzaprine (FLEXERIL) 10 MG tablet, TAKE 1 TABLET BY MOUTH THREE TIMES DAILYAS  NEEDED FOR MUSCLE SPASMS, Disp: 90 tablet, Rfl: 5 .  diclofenac (VOLTAREN) 75 MG EC tablet, Take 1 tablet (75 mg total) by mouth 2 (two) times daily., Disp: 60 tablet, Rfl: 3 .  diclofenac sodium (VOLTAREN) 1 % GEL, Apply to affected area three to four times daily., Disp: 100 g, Rfl: 0 .  fluvoxaMINE (LUVOX) 50 MG tablet, Take 50-100 mg by mouth at bedtime. , Disp: , Rfl:  .  gabapentin (NEURONTIN) 600 MG tablet, Take 1 tablet (600 mg total) by mouth 3 (three) times daily., Disp: 90 tablet, Rfl: 3 .  HYDROcodone-acetaminophen (NORCO) 5-325 MG tablet, Take 1 tablet by mouth every 6 (six) hours as needed for moderate pain., Disp: 60 tablet, Rfl: 0 .  ondansetron (ZOFRAN-ODT) 4 MG disintegrating tablet, Take 1 tablet (4 mg total) by mouth every 8 (eight) hours as needed for nausea or vomiting., Disp: 20 tablet, Rfl: 5 .  SPRINTEC 28 0.25-35 MG-MCG tablet, TAKE 1 TABLET BY MOUTH DAILY., Disp: 1 Package, Rfl: 11 .  verapamil (CALAN-SR) 120 MG CR tablet, TAKE ONE TABLET BY MOUTH AT BEDTIME, Disp: 90 tablet, Rfl: 3 .  vitamin B-12 (CYANOCOBALAMIN) 1000 MCG tablet, Take 1,000 mcg by mouth daily., Disp: , Rfl:  .  ciprofloxacin (CIPRO) 500 MG tablet, Take 1 tablet (500 mg total) by mouth 2 (two) times daily., Disp: 6 tablet, Rfl: 0  Allergies  Allergen Reactions  . Lamotrigine Hives and Swelling  . Abilify [Aripiprazole] Other (See Comments)    Weight gain, did not help,   . Azithromycin Rash  . Depakote [Divalproex Sodium] Other (See Comments)    Hair loss  . Erythromycin Base Swelling  . Lithium Rash  . Penicillins Rash    Has patient had a PCN reaction causing immediate rash, facial/tongue/throat swelling, SOB or lightheadedness with hypotension: Yes- swelling, rash  Has patient had a PCN reaction causing severe rash involving mucus membranes or skin necrosis: Yes Has patient had a PCN reaction that required hospitalization No Has patient had a PCN reaction occurring within the last 10 years:  No If all of the above answers are "NO", then may proceed with Cephalosporin use.   . Pregabalin Palpitations    Manic symptoms, no sleep x's 3 days, no appetite.  . Seroquel [Quetiapine Fumarate] Other (See Comments)    Irritable  . Sulfa Antibiotics Rash  . Tetanus Toxoids Other (See Comments)    Swelling to injection site  . Tramadol Nausea And Vomiting  . Ziprasidone Hcl Other (See Comments)    Heat/cold intolerance.     Objective:  BP (!) 143/83   Pulse (!) 111   VITALS: Per patient if applicable, see vitals. GENERAL: Alert, appears well and in no acute distress. HEENT: Atraumatic, conjunctiva clear, no obvious abnormalities on inspection of external nose and ears. NECK: Normal movements of the head and neck. CARDIOPULMONARY: No increased WOB. Speaking in clear sentences. I:E ratio WNL.  MS: Moves all visible extremities without noticeable abnormality. PSYCH: Pleasant and cooperative, well-groomed. Speech normal rate and rhythm. Affect is appropriate. Insight and judgement are appropriate. Attention is focused, linear, and appropriate.  NEURO: CN grossly intact. Oriented as arrived to appointment on time with  no prompting. Moves both UE equally.  SKIN: No obvious lesions, wounds, erythema, or cyanosis noted on face or hands.  Depression screen [facility] 2/9 01/27/2017 11/11/2016 07/01/2015  Decreased Interest 0 0 0  Down, Depressed, Hopeless 1 0 0  PHQ - 2 Score 1 0 0     . COVID-19 Education: The signs and symptoms of COVID-19 were discussed with the patient and how to seek care for testing if needed. The importance of social distancing was discussed today. . Reviewed expectations re: course of current medical issues. . Discussed self-management of symptoms. . Outlined signs and symptoms indicating need for more acute intervention. . Patient verbalized understanding and all questions were answered. Marland Kitchen Health Maintenance issues including appropriate healthy diet, exercise, and  smoking avoidance were discussed with patient. . See orders for this visit as documented in the electronic medical record.  Arnette Norris, MD  Records requested if needed. Time spent: 40 minutes, of which >50% was spent in obtaining information about her symptoms, reviewing her previous labs, evaluations, and treatments, counseling her about her condition (please see the discussed topics above), and developing a plan to further investigate it; she had a number of questions which I addressed.   Lab Results  Component Value Date   WBC 9.5 06/26/2019   HGB 13.8 06/26/2019   HCT 42.1 06/26/2019   PLT 293 06/26/2019   GLUCOSE 91 06/26/2019   CHOL 245 (H) 06/24/2016   TRIG 363.0 (H) 06/24/2016   HDL 40.20 06/24/2016   LDLDIRECT 144.0 06/24/2016   LDLCALC 114 (H) 02/04/2012   ALT 11 05/09/2017   AST 15 05/09/2017   NA 138 06/26/2019   K 3.9 06/26/2019   CL 104 06/26/2019   CREATININE 0.94 06/26/2019   BUN 12 06/26/2019   CO2 23 06/26/2019   TSH 0.98 05/09/2017    Lab Results  Component Value Date   TSH 0.98 05/09/2017   Lab Results  Component Value Date   WBC 9.5 06/26/2019   HGB 13.8 06/26/2019   HCT 42.1 06/26/2019   MCV 87.2 06/26/2019   PLT 293 06/26/2019   Lab Results  Component Value Date   NA 138 06/26/2019   K 3.9 06/26/2019   CO2 23 06/26/2019   GLUCOSE 91 06/26/2019   BUN 12 06/26/2019   CREATININE 0.94 06/26/2019   BILITOT 0.4 05/09/2017   ALKPHOS 52 05/09/2017   AST 15 05/09/2017   ALT 11 05/09/2017   PROT 7.0 05/09/2017   ALBUMIN 4.2 05/09/2017   CALCIUM 8.8 (L) 06/26/2019   ANIONGAP 11 06/26/2019   GFR 68.49 05/09/2017   Lab Results  Component Value Date   CHOL 245 (H) 06/24/2016   Lab Results  Component Value Date   HDL 40.20 06/24/2016   Lab Results  Component Value Date   LDLCALC 114 (H) 02/04/2012   Lab Results  Component Value Date   TRIG 363.0 (H) 06/24/2016   Lab Results  Component Value Date   CHOLHDL 6 06/24/2016   No  results found for: HGBA1C     Assessment & Plan:   Problem List Items Addressed This Visit      Active Problems   POTS (postural orthostatic tachycardia syndrome) - Primary    She complains of dizziness and shaky when sitting or standing since last Monday.  .When stands for too long she also has blurred vision.   This am BP 143/83 P 111. Yesterday was 112/79 P 112. Saturday 143/87; 129/79; 99/43; 122/75.   Went to  [place] last week which they did labs but pt had to leave due to friend death.      Possible urinary tract infection   Dizziness    Likely due to pots but UA was positive/ slightly, no culture was taken or micro. We agreed to try cipro 500 mg twice daily for 3 days. Push fluids.  If still dizzy, we need to send her to get treatment POTs.         I am having [name] start on ciprofloxacin. I am also having her maintain her vitamin B-12, clonazePAM, diclofenac sodium, fluvoxaMINE, cetirizine, cholecalciferol, acetaminophen, buPROPion, diclofenac, ondansetron, Sprintec 28, verapamil, gabapentin, cyclobenzaprine, and HYDROcodone-acetaminophen.  Meds ordered this encounter  Medications  . ciprofloxacin (CIPRO) 500 MG tablet    Sig: Take 1 tablet (500 mg total) by mouth 2 (two) times daily.    Dispense:  6 tablet    Refill:  0     Arnette Norris, MD

## 2019-07-02 ENCOUNTER — Ambulatory Visit (INDEPENDENT_AMBULATORY_CARE_PROVIDER_SITE_OTHER): Payer: Medicare HMO | Admitting: Family Medicine

## 2019-07-02 ENCOUNTER — Encounter: Payer: Self-pay | Admitting: Family Medicine

## 2019-07-02 ENCOUNTER — Other Ambulatory Visit: Payer: Self-pay

## 2019-07-02 VITALS — BP 143/83 | HR 111

## 2019-07-02 DIAGNOSIS — R3989 Other symptoms and signs involving the genitourinary system: Secondary | ICD-10-CM

## 2019-07-02 DIAGNOSIS — G90A Postural orthostatic tachycardia syndrome (POTS): Secondary | ICD-10-CM

## 2019-07-02 DIAGNOSIS — I498 Other specified cardiac arrhythmias: Secondary | ICD-10-CM | POA: Diagnosis not present

## 2019-07-02 DIAGNOSIS — R42 Dizziness and giddiness: Secondary | ICD-10-CM | POA: Diagnosis not present

## 2019-07-02 MED ORDER — CIPROFLOXACIN HCL 500 MG PO TABS: 500.0000 mg | ORAL_TABLET | Freq: Two times a day (BID) | ORAL | 0 refills | Status: DC

## 2019-07-02 NOTE — Assessment & Plan Note (Signed)
She complains of dizziness and shaky when sitting or standing since last Monday.  .When stands for too long she also has blurred vision.   This am BP 143/83 P 111. Yesterday was 112/79 P 112. Saturday 143/87; 129/79; 99/43; 122/75.   Went to [facility] last week which they did labs but pt had to leave due to friend death.

## 2019-07-02 NOTE — Assessment & Plan Note (Signed)
Likely due to pots but UA was positive/ slightly, no culture was taken or micro. We agreed to try cipro 500 mg twice daily for 3 days. Push fluids.  If still dizzy, we need to send her to get treatment POTs.

## 2019-07-12 DIAGNOSIS — F411 Generalized anxiety disorder: Secondary | ICD-10-CM | POA: Diagnosis not present

## 2019-07-12 DIAGNOSIS — F3162 Bipolar disorder, current episode mixed, moderate: Secondary | ICD-10-CM | POA: Diagnosis not present

## 2019-07-16 NOTE — Progress Notes (Signed)
Virtual Visit via Video   Due to the COVID-19 pandemic, this visit was completed with telemedicine (audio/video) technology to reduce patient and provider exposure as well as to preserve personal protective equipment.   I connected with Melissa [name] by a video enabled telemedicine application and verified that I am speaking with the correct person using two identifiers. Location patient: Home Location provider: [facility], Office Persons participating in the virtual visit: Nichola Sizer, MD   I discussed the limitations of evaluation and management by telemedicine and the availability of in person appointments. The patient expressed understanding and agreed to proceed.  Care Team   Patient Care Team: Lucille Passy, MD as PCP - General (Family Medicine)  Subjective:   HPI: Patient is wanting to follow-up with medication. On 1.11.21 she was seen virtually for POTS Sx. She had gone to [facility] on 1.5.21 but had to leave due to a death of a friend but some labs were completed. Due to possible UTI from U/A at [facility] pt was Tx with Cipro 500mg  2bid x3d and advised to push fluids but if still dizzy would need to send her to get Tx for POT's. She states that she seems to be doing much better.  Review of Systems  Constitutional: Negative.  Negative for fever.  HENT: Negative.  Negative for congestion and hearing loss.   Eyes: Negative.  Negative for blurred vision and discharge.  Respiratory: Negative.  Negative for cough and shortness of breath.   Cardiovascular: Negative.  Negative for chest pain.  Gastrointestinal: Negative.  Negative for heartburn.  Genitourinary: Negative.  Negative for dysuria.  Musculoskeletal: Negative.  Negative for falls and myalgias.  Skin: Negative.  Negative for rash.  Neurological: Negative.  Negative for dizziness and loss of consciousness.  Endo/Heme/Allergies: Negative.  Does not bruise/bleed easily.  Psychiatric/Behavioral: Negative.  Negative  for depression and memory loss.  All other systems reviewed and are negative.    Patient Active Problem List   Diagnosis Date Noted  . Possible urinary tract infection 07/02/2019  . Dizziness 07/02/2019  . Tremor of both hands 03/27/2018  . Syncopal episodes 09/10/2017  . HTN (hypertension) 05/09/2017  . Menorrhagia 11/11/2016  . Alopecia 03/02/2016  . Bilateral cataracts 08/14/2015  . Acne 10/21/2014  . Liver lesion 09/09/2014  . Vitamin D deficiency 09/09/2014  . Hot flashes 06/10/2014  . Chronic pain syndrome 05/20/2014  . Vitamin B12 deficiency 03/12/2014  . Contraception management 09/20/2013  . IBS (irritable bowel syndrome) 09/20/2013  . Unspecified vitamin D deficiency 04/03/2013  . HLD (hyperlipidemia) 04/03/2013  . Osteopenia 04/03/2013  . POTS (postural orthostatic tachycardia syndrome) 02/26/2013  . Migraine 11/29/2012  . Allergic rhinitis 10/05/2012  . Mixed bipolar I disorder in remission ([place]) 10/05/2012  . Combined fat and carbohydrate induced hyperlipemia 10/05/2012  . Fibromyalgia   . Bipolar disorder ([place])   . Fatigue 09/10/2011  . Clinical depression 09/10/2011  . Acid reflux 09/10/2011  . Non-restorative sleep 09/10/2011  . Disturbance in sleep behavior 09/10/2011    Social History   Tobacco Use  . Smoking status: Never Smoker  . Smokeless tobacco: Never Used  Substance Use Topics  . Alcohol use: No    Alcohol/week: 0.0 standard drinks    Current Outpatient Medications:  .  acetaminophen (TYLENOL) 500 MG tablet, Take 1,000 mg by mouth daily as needed for mild pain., Disp: , Rfl:  .  buPROPion (WELLBUTRIN XL) 150 MG 24 hr tablet, Take 1 tablet (150  mg total) by mouth daily., Disp: 90 tablet, Rfl: 3 .  cetirizine (ZYRTEC) 10 MG tablet, Take 10 mg by mouth daily., Disp: , Rfl:  .  cholecalciferol (VITAMIN D) 1000 units tablet, Take 5,000 Units by mouth daily., Disp: , Rfl:  .  clonazePAM (KLONOPIN) 1 MG tablet, Take 1 tablet (1 mg total) by mouth  2 (two) times daily as needed. for anxiety, Disp: 60 tablet, Rfl: 0 .  cyclobenzaprine (FLEXERIL) 10 MG tablet, TAKE 1 TABLET BY MOUTH THREE TIMES DAILYAS NEEDED FOR MUSCLE SPASMS, Disp: 270 tablet, Rfl: 3 .  diclofenac (VOLTAREN) 75 MG EC tablet, Take 1 tablet (75 mg total) by mouth 2 (two) times daily., Disp: 60 tablet, Rfl: 3 .  diclofenac sodium (VOLTAREN) 1 % GEL, Apply to affected area three to four times daily., Disp: 100 g, Rfl: 0 .  fluvoxaMINE (LUVOX) 50 MG tablet, Take 50-100 mg by mouth at bedtime. , Disp: , Rfl:  .  gabapentin (NEURONTIN) 600 MG tablet, Take 1 tablet (600 mg total) by mouth 3 (three) times daily., Disp: 270 tablet, Rfl: 3 .  HYDROcodone-acetaminophen (NORCO) 5-325 MG tablet, Take 1 tablet by mouth every 6 (six) hours as needed for moderate pain., Disp: 60 tablet, Rfl: 0 .  HYDROcodone-acetaminophen (NORCO/VICODIN) 5-325 MG tablet, Take 1 tablet by mouth every 6 (six) hours as needed for moderate pain., Disp: 60 tablet, Rfl: 0 .  HYDROcodone-acetaminophen (NORCO/VICODIN) 5-325 MG tablet, Take 1 tablet by mouth every 6 (six) hours as needed for moderate pain., Disp: 60 tablet, Rfl: 0 .  norgestimate-ethinyl estradiol (SPRINTEC 28) 0.25-35 MG-MCG tablet, Take 1 tablet by mouth daily., Disp: 6 Package, Rfl: 1 .  ondansetron (ZOFRAN-ODT) 4 MG disintegrating tablet, Take 1 tablet (4 mg total) by mouth every 8 (eight) hours as needed for nausea or vomiting., Disp: 20 tablet, Rfl: 11 .  verapamil (CALAN-SR) 120 MG CR tablet, Take 1 tablet (120 mg total) by mouth at bedtime., Disp: 90 tablet, Rfl: 3 .  vitamin B-12 (CYANOCOBALAMIN) 1000 MCG tablet, Take 1,000 mcg by mouth daily., Disp: , Rfl:   Allergies  Allergen Reactions  . Lamotrigine Hives and Swelling  . Abilify [Aripiprazole] Other (See Comments)    Weight gain, did not help,   . Azithromycin Rash  . Depakote [Divalproex Sodium] Other (See Comments)    Hair loss  . Erythromycin Base Swelling  . Lithium Rash  .  Penicillins Rash    Has patient had a PCN reaction causing immediate rash, facial/tongue/throat swelling, SOB or lightheadedness with hypotension: Yes- swelling, rash  Has patient had a PCN reaction causing severe rash involving mucus membranes or skin necrosis: Yes Has patient had a PCN reaction that required hospitalization No Has patient had a PCN reaction occurring within the last 10 years: No If all of the above answers are "NO", then may proceed with Cephalosporin use.   . Pregabalin Palpitations    Manic symptoms, no sleep x's 3 days, no appetite.  . Seroquel [Quetiapine Fumarate] Other (See Comments)    Irritable  . Sulfa Antibiotics Rash  . Tetanus Toxoids Other (See Comments)    Swelling to injection site  . Tramadol Nausea And Vomiting  . Ziprasidone Hcl Other (See Comments)    Heat/cold intolerance.     Objective:  There were no vitals taken for this visit.  VITALS: Per patient if applicable, see vitals. GENERAL: Alert, appears well and in no acute distress. HEENT: Atraumatic, conjunctiva clear, no obvious abnormalities on inspection of external  nose and ears. NECK: Normal movements of the head and neck. CARDIOPULMONARY: No increased WOB. Speaking in clear sentences. I:E ratio WNL.  MS: Moves all visible extremities without noticeable abnormality. PSYCH: Pleasant and cooperative, well-groomed. Speech normal rate and rhythm. Affect is appropriate. Insight and judgement are appropriate. Attention is focused, linear, and appropriate.  NEURO: CN grossly intact. Oriented as arrived to appointment on time with no prompting. Moves both UE equally.  SKIN: No obvious lesions, wounds, erythema, or cyanosis noted on face or hands.  Depression screen [facility] 2/9 01/27/2017 11/11/2016 07/01/2015  Decreased Interest 0 0 0  Down, Depressed, Hopeless 1 0 0  PHQ - 2 Score 1 0 0     . COVID-19 Education: The signs and symptoms of COVID-19 were discussed with the patient and how to seek care  for testing if needed. The importance of social distancing was discussed today. . Reviewed expectations re: course of current medical issues. . Discussed self-management of symptoms. . Outlined signs and symptoms indicating need for more acute intervention. . Patient verbalized understanding and all questions were answered. Marland Kitchen Health Maintenance issues including appropriate healthy diet, exercise, and smoking avoidance were discussed with patient. . See orders for this visit as documented in the electronic medical record.  Arnette Norris, MD  Records requested if needed. Time spent: 15 minutes, of which >50% was spent in obtaining information about her symptoms, reviewing her previous labs, evaluations, and treatments, counseling her about her condition (please see the discussed topics above), and developing a plan to further investigate it; she had a number of questions which I addressed.   Lab Results  Component Value Date   WBC 9.5 06/26/2019   HGB 13.8 06/26/2019   HCT 42.1 06/26/2019   PLT 293 06/26/2019   GLUCOSE 91 06/26/2019   CHOL 245 (H) 06/24/2016   TRIG 363.0 (H) 06/24/2016   HDL 40.20 06/24/2016   LDLDIRECT 144.0 06/24/2016   LDLCALC 114 (H) 02/04/2012   ALT 11 05/09/2017   AST 15 05/09/2017   NA 138 06/26/2019   K 3.9 06/26/2019   CL 104 06/26/2019   CREATININE 0.94 06/26/2019   BUN 12 06/26/2019   CO2 23 06/26/2019   TSH 0.98 05/09/2017    Lab Results  Component Value Date   TSH 0.98 05/09/2017   Lab Results  Component Value Date   WBC 9.5 06/26/2019   HGB 13.8 06/26/2019   HCT 42.1 06/26/2019   MCV 87.2 06/26/2019   PLT 293 06/26/2019   Lab Results  Component Value Date   NA 138 06/26/2019   K 3.9 06/26/2019   CO2 23 06/26/2019   GLUCOSE 91 06/26/2019   BUN 12 06/26/2019   CREATININE 0.94 06/26/2019   BILITOT 0.4 05/09/2017   ALKPHOS 52 05/09/2017   AST 15 05/09/2017   ALT 11 05/09/2017   PROT 7.0 05/09/2017   ALBUMIN 4.2 05/09/2017   CALCIUM  8.8 (L) 06/26/2019   ANIONGAP 11 06/26/2019   GFR 68.49 05/09/2017   Lab Results  Component Value Date   CHOL 245 (H) 06/24/2016   Lab Results  Component Value Date   HDL 40.20 06/24/2016   Lab Results  Component Value Date   LDLCALC 114 (H) 02/04/2012   Lab Results  Component Value Date   TRIG 363.0 (H) 06/24/2016   Lab Results  Component Value Date   CHOLHDL 6 06/24/2016   No results found for: HGBA1C     Assessment & Plan:   Problem List Items  Addressed This Visit      Active Problems   POTS (postural orthostatic tachycardia syndrome)    Staying hydrated and doing well.      Relevant Medications   verapamil (CALAN-SR) 120 MG CR tablet   Contraception management    Pleased with current OCPs.  Helping with dizziness due to improving her menorrhagia. eRx refills sent.      Menorrhagia    See above under contraception management.         I have discontinued Izora Gala E. Hartig's ciprofloxacin. I have changed her Sprintec 28 to norgestimate-ethinyl estradiol. I have also changed her buPROPion and verapamil. Additionally, I am having her start on HYDROcodone-acetaminophen and HYDROcodone-acetaminophen. Lastly, I am having her maintain her vitamin B-12, clonazePAM, diclofenac sodium, fluvoxaMINE, cetirizine, cholecalciferol, acetaminophen, diclofenac, cyclobenzaprine, gabapentin, HYDROcodone-acetaminophen, and ondansetron.  Meds ordered this encounter  Medications  . buPROPion (WELLBUTRIN XL) 150 MG 24 hr tablet    Sig: Take 1 tablet (150 mg total) by mouth daily.    Dispense:  90 tablet    Refill:  3  . cyclobenzaprine (FLEXERIL) 10 MG tablet    Sig: TAKE 1 TABLET BY MOUTH THREE TIMES DAILYAS NEEDED FOR MUSCLE SPASMS    Dispense:  270 tablet    Refill:  3  . gabapentin (NEURONTIN) 600 MG tablet    Sig: Take 1 tablet (600 mg total) by mouth 3 (three) times daily.    Dispense:  270 tablet    Refill:  3  . HYDROcodone-acetaminophen (NORCO) 5-325 MG tablet     Sig: Take 1 tablet by mouth every 6 (six) hours as needed for moderate pain.    Dispense:  60 tablet    Refill:  0    February  . HYDROcodone-acetaminophen (NORCO/VICODIN) 5-325 MG tablet    Sig: Take 1 tablet by mouth every 6 (six) hours as needed for moderate pain.    Dispense:  60 tablet    Refill:  0    March  . HYDROcodone-acetaminophen (NORCO/VICODIN) 5-325 MG tablet    Sig: Take 1 tablet by mouth every 6 (six) hours as needed for moderate pain.    Dispense:  60 tablet    Refill:  0    April  . ondansetron (ZOFRAN-ODT) 4 MG disintegrating tablet    Sig: Take 1 tablet (4 mg total) by mouth every 8 (eight) hours as needed for nausea or vomiting.    Dispense:  20 tablet    Refill:  11  . norgestimate-ethinyl estradiol (SPRINTEC 28) 0.25-35 MG-MCG tablet    Sig: Take 1 tablet by mouth daily.    Dispense:  6 Package    Refill:  1  . verapamil (CALAN-SR) 120 MG CR tablet    Sig: Take 1 tablet (120 mg total) by mouth at bedtime.    Dispense:  90 tablet    Refill:  3     Arnette Norris, MD

## 2019-07-17 ENCOUNTER — Telehealth (INDEPENDENT_AMBULATORY_CARE_PROVIDER_SITE_OTHER): Payer: Medicare HMO | Admitting: Family Medicine

## 2019-07-17 DIAGNOSIS — I498 Other specified cardiac arrhythmias: Secondary | ICD-10-CM

## 2019-07-17 DIAGNOSIS — Z30013 Encounter for initial prescription of injectable contraceptive: Secondary | ICD-10-CM | POA: Diagnosis not present

## 2019-07-17 DIAGNOSIS — N921 Excessive and frequent menstruation with irregular cycle: Secondary | ICD-10-CM | POA: Diagnosis not present

## 2019-07-17 DIAGNOSIS — G90A Postural orthostatic tachycardia syndrome (POTS): Secondary | ICD-10-CM

## 2019-07-17 MED ORDER — BUPROPION HCL ER (XL) 150 MG PO TB24: 150.0000 mg | ORAL_TABLET | Freq: Every day | ORAL | 3 refills | Status: AC

## 2019-07-17 MED ORDER — NORGESTIMATE-ETH ESTRADIOL 0.25-35 MG-MCG PO TABS: 1.0000 | ORAL_TABLET | Freq: Every day | ORAL | 1 refills | Status: DC

## 2019-07-17 MED ORDER — HYDROCODONE-ACETAMINOPHEN 5-325 MG PO TABS: 1.0000 | ORAL_TABLET | Freq: Four times a day (QID) | ORAL | 0 refills | Status: DC | PRN

## 2019-07-17 MED ORDER — GABAPENTIN 600 MG PO TABS: 600.0000 mg | ORAL_TABLET | Freq: Three times a day (TID) | ORAL | 3 refills | Status: DC

## 2019-07-17 MED ORDER — CYCLOBENZAPRINE HCL 10 MG PO TABS: ORAL_TABLET | ORAL | 3 refills | Status: DC

## 2019-07-17 MED ORDER — VERAPAMIL HCL ER 120 MG PO TBCR: 120.0000 mg | EXTENDED_RELEASE_TABLET | Freq: Every day | ORAL | 3 refills | Status: DC

## 2019-07-17 MED ORDER — ONDANSETRON 4 MG PO TBDP: 4.0000 mg | ORAL_TABLET | Freq: Three times a day (TID) | ORAL | 11 refills | Status: DC | PRN

## 2019-07-17 NOTE — Assessment & Plan Note (Signed)
Pleased with current OCPs.  Helping with dizziness due to improving her menorrhagia. eRx refills sent.

## 2019-07-17 NOTE — Assessment & Plan Note (Signed)
See above under contraception management.

## 2019-07-17 NOTE — Assessment & Plan Note (Signed)
Staying hydrated and doing well.

## 2019-07-26 DIAGNOSIS — F411 Generalized anxiety disorder: Secondary | ICD-10-CM | POA: Diagnosis not present

## 2019-07-26 DIAGNOSIS — F3162 Bipolar disorder, current episode mixed, moderate: Secondary | ICD-10-CM | POA: Diagnosis not present

## 2019-08-07 DIAGNOSIS — Z1231 Encounter for screening mammogram for malignant neoplasm of breast: Secondary | ICD-10-CM | POA: Diagnosis not present

## 2019-08-07 DIAGNOSIS — N941 Unspecified dyspareunia: Secondary | ICD-10-CM | POA: Diagnosis not present

## 2019-08-07 DIAGNOSIS — B373 Candidiasis of vulva and vagina: Secondary | ICD-10-CM | POA: Diagnosis not present

## 2019-08-07 DIAGNOSIS — Z01419 Encounter for gynecological examination (general) (routine) without abnormal findings: Secondary | ICD-10-CM | POA: Diagnosis not present

## 2019-08-07 LAB — HM PAP SMEAR: HM Pap smear: NEGATIVE

## 2019-08-07 LAB — HM MAMMOGRAPHY

## 2019-08-21 DIAGNOSIS — F3162 Bipolar disorder, current episode mixed, moderate: Secondary | ICD-10-CM | POA: Diagnosis not present

## 2019-08-21 DIAGNOSIS — F411 Generalized anxiety disorder: Secondary | ICD-10-CM | POA: Diagnosis not present

## 2019-08-28 ENCOUNTER — Ambulatory Visit (INDEPENDENT_AMBULATORY_CARE_PROVIDER_SITE_OTHER): Payer: Medicare HMO | Admitting: Family Medicine

## 2019-08-28 ENCOUNTER — Other Ambulatory Visit: Payer: Self-pay

## 2019-08-28 ENCOUNTER — Encounter: Payer: Self-pay | Admitting: Family Medicine

## 2019-08-28 VITALS — BP 128/76 | HR 96 | Temp 98.1°F | Ht 69.0 in | Wt 159.5 lb

## 2019-08-28 DIAGNOSIS — E785 Hyperlipidemia, unspecified: Secondary | ICD-10-CM

## 2019-08-28 DIAGNOSIS — F3170 Bipolar disorder, currently in remission, most recent episode unspecified: Secondary | ICD-10-CM | POA: Diagnosis not present

## 2019-08-28 DIAGNOSIS — I10 Essential (primary) hypertension: Secondary | ICD-10-CM | POA: Diagnosis not present

## 2019-08-28 DIAGNOSIS — M797 Fibromyalgia: Secondary | ICD-10-CM | POA: Diagnosis not present

## 2019-08-28 MED ORDER — METHOCARBAMOL 500 MG PO TABS: 500.0000 mg | ORAL_TABLET | Freq: Four times a day (QID) | ORAL | 1 refills | Status: DC

## 2019-08-28 NOTE — Assessment & Plan Note (Signed)
Rare PRN hydrocodone/acetaminophen. Discussed that I will not increase. Return once due for refills. Flexeril no longer helping, encouraged yoga stretching. Cannot afford PT or acupuncture. Trial of Robaxin to see if she gets better symptom control. Sedation risk discussed.

## 2019-08-28 NOTE — Progress Notes (Signed)
Subjective:     Melissa [name] is a 49 y.o. female presenting for Transfer of Care (from Dr Deborra Medina) and Spasms (more frequent even with using Flexeril. Change medication)     HPI  #Fibromylagia - has been on flexeril for years  - does not seem to be working as effectively - hydrocodone-acetaminophen 5-325 >> will take a few times a week but sometimes will go several weeks w/o needing it - muscle spasm - back and neck mostly - Gabapentin 600 mg TID - makes her feel dull in the had occasionally - Was taking flexeril at least 1 time per day and has needed this more often  #Bipolar disorder - managed by psychiatry - will take clonazepam prn -- will take a few times a week or may go several weeks - luvox - for bipolar disorder   #POTs - if she does not drink enough water she will get symptoms - under good control currently  #HLD - tried several statins w/ significant side effects - tried alternative medications but these made it worse  Review of Systems   Social History   Tobacco Use  Smoking Status Never Smoker  Smokeless Tobacco Never Used        Objective:    BP Readings from Last 3 Encounters:  08/28/19 128/76  07/02/19 (!) 143/83  06/26/19 133/77   Wt Readings from Last 3 Encounters:  08/28/19 159 lb 8 oz (72.3 kg)  06/26/19 153 lb (69.4 kg)  09/16/18 150 lb (68 kg)    BP 128/76   Pulse 96   Temp 98.1 F (36.7 C)   Ht 5\' 9"  (1.753 m)   Wt 159 lb 8 oz (72.3 kg)   LMP 08/14/2019   SpO2 99%   BMI 23.55 kg/m    Physical Exam Constitutional:      General: She is not in acute distress.    Appearance: She is well-developed. She is not diaphoretic.  HENT:     Right Ear: External ear normal.     Left Ear: External ear normal.     Nose: Nose normal.  Eyes:     Conjunctiva/sclera: Conjunctivae normal.  Cardiovascular:     Rate and Rhythm: Normal rate and regular rhythm.     Heart sounds: No murmur.  Pulmonary:     Effort: Pulmonary effort is  normal. No respiratory distress.     Breath sounds: Normal breath sounds. No wheezing.  Musculoskeletal:     Cervical back: Neck supple.  Skin:    General: Skin is warm and dry.     Capillary Refill: Capillary refill takes less than 2 seconds.  Neurological:     Mental Status: She is alert. Mental status is at baseline.  Psychiatric:        Mood and Affect: Mood normal.        Behavior: Behavior normal.           Assessment & Plan:   Problem List Items Addressed This Visit      Cardiovascular and Mediastinum   HTN (hypertension)    BP at goal. Continue medication        Other   Fibromyalgia    Rare PRN hydrocodone/acetaminophen. Discussed that I will not increase. Return once due for refills. Flexeril no longer helping, encouraged yoga stretching. Cannot afford PT or acupuncture. Trial of Robaxin to see if she gets better symptom control. Sedation risk discussed.        Relevant Medications   methocarbamol (  ROBAXIN) 500 MG tablet   HLD (hyperlipidemia) - Primary    Declines testing. Declines treatment - hx of trying several statin therapies w/ worsening pain and no change with non-statin therapy.       Mixed bipolar I disorder in remission [facility])    See psychiatry who prescribes the benzos. Does not take with hydrocodone. Doing well overall. Appreciate psych support.           Return if symptoms worsen or fail to improve.  Lesleigh Noe, MD

## 2019-08-28 NOTE — Assessment & Plan Note (Signed)
BP at goal. Continue medication

## 2019-08-28 NOTE — Patient Instructions (Signed)
Fibromyalgia - try to get back into the yoga stretching routine - Can try the new medication -- it can cause low blood pressure and confusion so be careful with taking

## 2019-08-28 NOTE — Assessment & Plan Note (Signed)
See psychiatry who prescribes the benzos. Does not take with hydrocodone. Doing well overall. Appreciate psych support.

## 2019-08-28 NOTE — Assessment & Plan Note (Signed)
Declines testing. Declines treatment - hx of trying several statin therapies w/ worsening pain and no change with non-statin therapy.

## 2019-09-18 DIAGNOSIS — F411 Generalized anxiety disorder: Secondary | ICD-10-CM | POA: Diagnosis not present

## 2019-09-18 DIAGNOSIS — F3131 Bipolar disorder, current episode depressed, mild: Secondary | ICD-10-CM | POA: Diagnosis not present

## 2019-10-23 ENCOUNTER — Encounter: Payer: Self-pay | Admitting: Internal Medicine

## 2019-10-23 ENCOUNTER — Other Ambulatory Visit: Payer: Self-pay

## 2019-10-23 ENCOUNTER — Ambulatory Visit (INDEPENDENT_AMBULATORY_CARE_PROVIDER_SITE_OTHER): Payer: Medicare HMO | Admitting: Internal Medicine

## 2019-10-23 VITALS — BP 145/82 | HR 110 | Ht 68.0 in | Wt 156.0 lb

## 2019-10-23 DIAGNOSIS — F419 Anxiety disorder, unspecified: Secondary | ICD-10-CM | POA: Diagnosis not present

## 2019-10-23 DIAGNOSIS — I498 Other specified cardiac arrhythmias: Secondary | ICD-10-CM | POA: Diagnosis not present

## 2019-10-23 DIAGNOSIS — Z7689 Persons encountering health services in other specified circumstances: Secondary | ICD-10-CM

## 2019-10-23 DIAGNOSIS — G90A Postural orthostatic tachycardia syndrome (POTS): Secondary | ICD-10-CM

## 2019-10-23 NOTE — Patient Instructions (Signed)
COVID-19 COVID-19 is a respiratory infection that is caused by a virus called severe acute respiratory syndrome coronavirus 2 (SARS-CoV-2). The disease is also known as coronavirus disease or novel coronavirus. In some people, the virus may not cause any symptoms. In others, it may cause a serious infection. The infection can get worse quickly and can lead to complications, such as:  Pneumonia, or infection of the lungs.  Acute respiratory distress syndrome or ARDS. This is a condition in which fluid build-up in the lungs prevents the lungs from filling with air and passing oxygen into the blood.  Acute respiratory failure. This is a condition in which there is not enough oxygen passing from the lungs to the body or when carbon dioxide is not passing from the lungs out of the body.  Sepsis or septic shock. This is a serious bodily reaction to an infection.  Blood clotting problems.  Secondary infections due to bacteria or fungus.  Organ failure. This is when your body's organs stop working. The virus that causes COVID-19 is contagious. This means that it can spread from person to person through droplets from coughs and sneezes (respiratory secretions). What are the causes? This illness is caused by a virus. You may catch the virus by:  Breathing in droplets from an infected person. Droplets can be spread by a person breathing, speaking, singing, coughing, or sneezing.  Touching something, like a table or a doorknob, that was exposed to the virus (contaminated) and then touching your mouth, nose, or eyes. What increases the risk? Risk for infection You are more likely to be infected with this virus if you:  Are within 6 feet (2 meters) of a person with COVID-19.  Provide care for or live with a person who is infected with COVID-19.  Spend time in crowded indoor spaces or live in shared housing. Risk for serious illness You are more likely to become seriously ill from the virus if you:   Are 50 years of age or older. The higher your age, the more you are at risk for serious illness.  Live in a nursing home or long-term care facility.  Have cancer.  Have a long-term (chronic) disease such as: ? Chronic lung disease, including chronic obstructive pulmonary disease or asthma. ? A long-term disease that lowers your body's ability to fight infection (immunocompromised). ? Heart disease, including heart failure, a condition in which the arteries that lead to the heart become narrow or blocked (coronary artery disease), a disease which makes the heart muscle thick, weak, or stiff (cardiomyopathy). ? Diabetes. ? Chronic kidney disease. ? Sickle cell disease, a condition in which red blood cells have an abnormal "sickle" shape. ? Liver disease.  Are obese. What are the signs or symptoms? Symptoms of this condition can range from mild to severe. Symptoms may appear any time from 2 to 14 days after being exposed to the virus. They include:  A fever or chills.  A cough.  Difficulty breathing.  Headaches, body aches, or muscle aches.  Runny or stuffy (congested) nose.  A sore throat.  New loss of taste or smell. Some people may also have stomach problems, such as nausea, vomiting, or diarrhea. Other people may not have any symptoms of COVID-19. How is this diagnosed? This condition may be diagnosed based on:  Your signs and symptoms, especially if: ? You live in an area with a COVID-19 outbreak. ? You recently traveled to or from an area where the virus is common. ? You   provide care for or live with a person who was diagnosed with COVID-19. ? You were exposed to a person who was diagnosed with COVID-19.  A physical exam.  Lab tests, which may include: ? Taking a sample of fluid from the back of your nose and throat (nasopharyngeal fluid), your nose, or your throat using a swab. ? A sample of mucus from your lungs (sputum). ? Blood tests.  Imaging tests, which  may include, X-rays, CT scan, or ultrasound. How is this treated? At present, there is no medicine to treat COVID-19. Medicines that treat other diseases are being used on a trial basis to see if they are effective against COVID-19. Your health care provider will talk with you about ways to treat your symptoms. For most people, the infection is mild and can be managed at home with rest, fluids, and over-the-counter medicines. Treatment for a serious infection usually takes places in a hospital intensive care unit (ICU). It may include one or more of the following treatments. These treatments are given until your symptoms improve.  Receiving fluids and medicines through an IV.  Supplemental oxygen. Extra oxygen is given through a tube in the nose, a face mask, or a hood.  Positioning you to lie on your stomach (prone position). This makes it easier for oxygen to get into the lungs.  Continuous positive airway pressure (CPAP) or bi-level positive airway pressure (BPAP) machine. This treatment uses mild air pressure to keep the airways open. A tube that is connected to a motor delivers oxygen to the body.  Ventilator. This treatment moves air into and out of the lungs by using a tube that is placed in your windpipe.  Tracheostomy. This is a procedure to create a hole in the neck so that a breathing tube can be inserted.  Extracorporeal membrane oxygenation (ECMO). This procedure gives the lungs a chance to recover by taking over the functions of the heart and lungs. It supplies oxygen to the body and removes carbon dioxide. Follow these instructions at home: Lifestyle  If you are sick, stay home except to get medical care. Your health care provider will tell you how long to stay home. Call your health care provider before you go for medical care.  Rest at home as told by your health care provider.  Do not use any products that contain nicotine or tobacco, such as cigarettes, e-cigarettes, and  chewing tobacco. If you need help quitting, ask your health care provider.  Return to your normal activities as told by your health care provider. Ask your health care provider what activities are safe for you. General instructions  Take over-the-counter and prescription medicines only as told by your health care provider.  Drink enough fluid to keep your urine pale yellow.  Keep all follow-up visits as told by your health care provider. This is important. How is this prevented?  There is no vaccine to help prevent COVID-19 infection. However, there are steps you can take to protect yourself and others from this virus. To protect yourself:   Do not travel to areas where COVID-19 is a risk. The areas where COVID-19 is reported change often. To identify high-risk areas and travel restrictions, check the CDC travel website: wwwnc.cdc.gov/travel/notices  If you live in, or must travel to, an area where COVID-19 is a risk, take precautions to avoid infection. ? Stay away from people who are sick. ? Wash your hands often with soap and water for 20 seconds. If soap and water   are not available, use an alcohol-based hand sanitizer. ? Avoid touching your mouth, face, eyes, or nose. ? Avoid going out in public, follow guidance from your state and local health authorities. ? If you must go out in public, wear a cloth face covering or face mask. Make sure your mask covers your nose and mouth. ? Avoid crowded indoor spaces. Stay at least 6 feet (2 meters) away from others. ? Disinfect objects and surfaces that are frequently touched every day. This may include:  Counters and tables.  Doorknobs and light switches.  Sinks and faucets.  Electronics, such as phones, remote controls, keyboards, computers, and tablets. To protect others: If you have symptoms of COVID-19, take steps to prevent the virus from spreading to others.  If you think you have a COVID-19 infection, contact your health care  provider right away. Tell your health care team that you think you may have a COVID-19 infection.  Stay home. Leave your house only to seek medical care. Do not use public transport.  Do not travel while you are sick.  Wash your hands often with soap and water for 20 seconds. If soap and water are not available, use alcohol-based hand sanitizer.  Stay away from other members of your household. Let healthy household members care for children and pets, if possible. If you have to care for children or pets, wash your hands often and wear a mask. If possible, stay in your own room, separate from others. Use a different bathroom.  Make sure that all people in your household wash their hands well and often.  Cough or sneeze into a tissue or your sleeve or elbow. Do not cough or sneeze into your hand or into the air.  Wear a cloth face covering or face mask. Make sure your mask covers your nose and mouth. Where to find more information  Centers for Disease Control and Prevention: www.cdc.gov/coronavirus/2019-ncov/index.html  World Health Organization: www.who.int/health-topics/coronavirus Contact a health care provider if:  You live in or have traveled to an area where COVID-19 is a risk and you have symptoms of the infection.  You have had contact with someone who has COVID-19 and you have symptoms of the infection. Get help right away if:  You have trouble breathing.  You have pain or pressure in your chest.  You have confusion.  You have bluish lips and fingernails.  You have difficulty waking from sleep.  You have symptoms that get worse. These symptoms may represent a serious problem that is an emergency. Do not wait to see if the symptoms will go away. Get medical help right away. Call your local emergency services (911 in the [place] Do not drive yourself to the hospital. Let the emergency medical personnel know if you think you have COVID-19. Summary  COVID-19 is a  respiratory infection that is caused by a virus. It is also known as coronavirus disease or novel coronavirus. It can cause serious infections, such as pneumonia, acute respiratory distress syndrome, acute respiratory failure, or sepsis.  The virus that causes COVID-19 is contagious. This means that it can spread from person to person through droplets from breathing, speaking, singing, coughing, or sneezing.  You are more likely to develop a serious illness if you are 50 years of age or older, have a weak immune system, live in a nursing home, or have chronic disease.  There is no medicine to treat COVID-19. Your health care provider will talk with you about ways to treat your symptoms.    Take steps to protect yourself and others from infection. Wash your hands often and disinfect objects and surfaces that are frequently touched every day. Stay away from people who are sick and wear a mask if you are sick. This information is not intended to replace advice given to you by your health care provider. Make sure you discuss any questions you have with your health care provider. Document Revised: 04/06/2019 Document Reviewed: 07/13/2018 Elsevier Patient Education  2020 [employer]  

## 2019-10-23 NOTE — Progress Notes (Signed)
New Patient Office Visit  Subjective:  Patient ID: Melissa [name], female    DOB: Sep 18, 1970  Age: 49 y.o. MRN: [id number]  CC:  Chief Complaint  Patient presents with  . Transitions Of Care    HPI Melissa [name] presents for for establishment as a new patient she is known to have fibromyalgia and postural hypotension has been seen in the past at [facility] she also has a private physician but switching her care now to our clinic she also sees her gynecologist patient did not have her vaccination yet she needs colonoscopy shingle shot and Covid shot and this was discussed with the  Past Medical History:  Diagnosis Date  . Arthritis   . Bipolar disorder ([place])   . Fibromyalgia    on disability  . Migraine   . Muscle spasm   . Pain of right side of body    chronic  . Panic attack   . POTS (postural orthostatic tachycardia syndrome)   . Syncope    recurrent  . Uterine perforation    hx of    Past Surgical History:  Procedure Laterality Date  . CATARACT EXTRACTION  dec 2014  . DILATION AND CURETTAGE OF UTERUS    . LASIK  dec 2014    Family History  Problem Relation Age of Onset  . Diabetes Mother   . Hypertension Mother   . Brain cancer Father   . Ovarian cancer Maternal Grandmother        metastasized  . Lung cancer Maternal Grandfather   . Cancer Paternal Grandmother        not sure of the type  . Cancer Paternal Grandfather        not sure of the type  . Migraines Neg Hx     Social History   Socioeconomic History  . Marital status: Married    Spouse name: Melissa [name]  . Number of children: 0  . Years of education: some college  . Highest education level: Not on file  Occupational History    Employer: OTHER    Comment: disabled  Tobacco Use  . Smoking status: Never Smoker  . Smokeless tobacco: Never Used  Substance and Sexual Activity  . Alcohol use: No    Alcohol/week: 0.0 standard drinks  . Drug use: No  . Sexual activity: Yes    Birth  control/protection: Surgical    Comment: also on OCPs  Other Topics Concern  . Not on file  Social History Narrative   08/28/19   From: [place], moved because her husband was from [facility]   Living: with husband Melissa Ramsey, since 1990   Work: disability due to fibromyalgia and bipolar disorder      Family: mother in law is nearby, her mother is in [place]; has a sister      Enjoys: crochet, baking, reading      Exercise: walking the dog, yoga - tries to do 3 times a week   Diet: not great, more sweets than she should      Safety   Seat belts: Yes    Guns: Yes  and secure   Safe in relationships: Yes       Social Determinants of Health   Financial Resource Strain:   . Difficulty of Paying Living Expenses:   Food Insecurity:   . Worried About Charity fundraiser in the Last Year:   . Arboriculturist in the Last Year:   Transportation Needs:   .  Lack of Transportation (Medical):   Marland Kitchen Lack of Transportation (Non-Medical):   Physical Activity:   . Days of Exercise per Week:   . Minutes of Exercise per Session:   Stress:   . Feeling of Stress :   Social Connections:   . Frequency of Communication with Friends and Family:   . Frequency of Social Gatherings with Friends and Family:   . Attends Religious Services:   . Active Member of Clubs or Organizations:   . Attends Archivist Meetings:   Marland Kitchen Marital Status:   Intimate Partner Violence:   . Fear of Current or Ex-Partner:   . Emotionally Abused:   Marland Kitchen Physically Abused:   . Sexually Abused:     ROS Review of Systems  Constitutional: Positive for fatigue.  HENT: Positive for postnasal drip.   Respiratory: Negative for chest tightness and shortness of breath.   Cardiovascular: Positive for palpitations (pt has POTS and is on verapamil).  Musculoskeletal: Negative for arthralgias.  Neurological: Negative for syncope and headaches.  Psychiatric/Behavioral: Positive for sleep disturbance. The patient is nervous/anxious.      Objective:   Today's Vitals: BP (!) 145/82   Pulse (!) 110   Ht 5\' 8"  (1.727 m)   Wt 156 lb (70.8 kg)   BMI 23.72 kg/m   Physical Exam Constitutional:      Appearance: She is normal weight.  HENT:     Nose: Nose normal.     Mouth/Throat:     Mouth: Mucous membranes are moist.  Eyes:     Conjunctiva/sclera: Conjunctivae normal.     Pupils: Pupils are equal, round, and reactive to light.  Cardiovascular:     Rate and Rhythm: Normal rate and regular rhythm.     Heart sounds: No murmur.  Pulmonary:     Effort: No respiratory distress.     Breath sounds: No wheezing or rales.  Abdominal:     Palpations: There is no mass.     Tenderness: There is no abdominal tenderness. There is no guarding.  Genitourinary:    Comments: Not indicated Musculoskeletal:        General: No tenderness.     Cervical back: No rigidity.     Right lower leg: No edema.     Left lower leg: No edema.  Skin:    General: Skin is warm.  Neurological:     General: No focal deficit present.     Mental Status: She is alert.  Psychiatric:        Mood and Affect: Mood normal.     Assessment & Plan:   Problem List Items Addressed This Visit      Other   Anxiety      Outpatient Encounter Medications as of 10/23/2019  Medication Sig  . acetaminophen (TYLENOL) 500 MG tablet Take 1,000 mg by mouth daily as needed for mild pain.  Marland Kitchen buPROPion (WELLBUTRIN XL) 150 MG 24 hr tablet Take 1 tablet (150 mg total) by mouth daily.  . cholecalciferol (VITAMIN D) 1000 units tablet Take 5,000 Units by mouth daily.  . clonazePAM (KLONOPIN) 1 MG tablet Take 1 tablet (1 mg total) by mouth 2 (two) times daily as needed. for anxiety  . diclofenac sodium (VOLTAREN) 1 % GEL Apply to affected area three to four times daily.  . fluvoxaMINE (LUVOX) 50 MG tablet Take 50-100 mg by mouth at bedtime.   . gabapentin (NEURONTIN) 600 MG tablet Take 1 tablet (600 mg total) by mouth 3 (three)  times daily.  Marland Kitchen  HYDROcodone-acetaminophen (NORCO/VICODIN) 5-325 MG tablet Take 1 tablet by mouth every 6 (six) hours as needed for moderate pain.  . methocarbamol (ROBAXIN) 500 MG tablet Take 1 tablet (500 mg total) by mouth 4 (four) times daily.  . norgestimate-ethinyl estradiol (SPRINTEC 28) 0.25-35 MG-MCG tablet Take 1 tablet by mouth daily.  . ondansetron (ZOFRAN-ODT) 4 MG disintegrating tablet Take 1 tablet (4 mg total) by mouth every 8 (eight) hours as needed for nausea or vomiting.  . verapamil (CALAN-SR) 120 MG CR tablet Take 1 tablet (120 mg total) by mouth at bedtime.  . vitamin B-12 (CYANOCOBALAMIN) 1000 MCG tablet Take 1,000 mcg by mouth daily.  . [DISCONTINUED] HYDROcodone-acetaminophen (NORCO) 5-325 MG tablet Take 1 tablet by mouth every 6 (six) hours as needed for moderate pain.  . [DISCONTINUED] HYDROcodone-acetaminophen (NORCO/VICODIN) 5-325 MG tablet Take 1 tablet by mouth every 6 (six) hours as needed for moderate pain.   No facility-administered encounter medications on file as of 10/23/2019.   Establishing care with new doctor, encounter for  Anxiety  POTS (postural orthostatic tachycardia syndrome)   Follow-up: Return in about 3 months (around 01/23/2020).   Cletis Athens, MD

## 2019-11-30 ENCOUNTER — Other Ambulatory Visit: Payer: Self-pay

## 2019-11-30 ENCOUNTER — Encounter: Payer: Self-pay | Admitting: Emergency Medicine

## 2019-11-30 ENCOUNTER — Emergency Department
Admission: EM | Admit: 2019-11-30 | Discharge: 2019-11-30 | Disposition: A | Discharge status: Home / Self Care | Payer: Medicare HMO | Attending: Emergency Medicine | Admitting: Emergency Medicine

## 2019-11-30 DIAGNOSIS — Z793 Long term (current) use of hormonal contraceptives: Secondary | ICD-10-CM | POA: Insufficient documentation

## 2019-11-30 DIAGNOSIS — R110 Nausea: Secondary | ICD-10-CM | POA: Insufficient documentation

## 2019-11-30 DIAGNOSIS — Z79899 Other long term (current) drug therapy: Secondary | ICD-10-CM | POA: Diagnosis not present

## 2019-11-30 DIAGNOSIS — H53149 Visual discomfort, unspecified: Secondary | ICD-10-CM | POA: Insufficient documentation

## 2019-11-30 DIAGNOSIS — G43001 Migraine without aura, not intractable, with status migrainosus: Secondary | ICD-10-CM | POA: Insufficient documentation

## 2019-11-30 MED ORDER — SODIUM CHLORIDE 0.9 % IV BOLUS
1000.0000 mL | Freq: Once | INTRAVENOUS | Status: AC
  Administered 2019-11-30: 1000 mL via INTRAVENOUS

## 2019-11-30 MED ORDER — KETOROLAC TROMETHAMINE 30 MG/ML IJ SOLN
30.0000 mg | Freq: Once | INTRAMUSCULAR | Status: AC
  Administered 2019-11-30: 30 mg via INTRAVENOUS
  Filled 2019-11-30: qty 1

## 2019-11-30 MED ORDER — METOCLOPRAMIDE HCL 5 MG/ML IJ SOLN
20.0000 mg | Freq: Once | INTRAVENOUS | Status: AC
  Administered 2019-11-30: 20 mg via INTRAVENOUS
  Filled 2019-11-30: qty 4

## 2019-11-30 MED ORDER — ONDANSETRON HCL 4 MG/2ML IJ SOLN
4.0000 mg | Freq: Once | INTRAMUSCULAR | Status: AC
  Administered 2019-11-30: 4 mg via INTRAVENOUS
  Filled 2019-11-30: qty 2

## 2019-11-30 NOTE — ED Provider Notes (Signed)
[facility] Emergency Department Provider Note   ____________________________________________   First MD Initiated Contact with Patient 11/30/19 0809     (approximate)  I have reviewed the triage vital signs and the nursing notes.   HISTORY  Chief Complaint Migraine    HPI Melissa [name] is a 49 y.o. female patient presents with 3 days of migraine headache.  Patient state mild relief taking her migraine medicine consistent with Benadryl and Tylenol on the first day.  Patient states medicine did not work on the second day.  Patient called her PCP yesterday and was told to come to the emergency room if no improvement by this morning.  Rates pain as 8/10.  Described pain as "achy".  For complaints associated with mild photophobia and nausea.         Past Medical History:  Diagnosis Date  . Arthritis   . Bipolar disorder ([place])   . Fibromyalgia    on disability  . Migraine   . Muscle spasm   . Pain of right side of body    chronic  . Panic attack   . POTS (postural orthostatic tachycardia syndrome)   . Syncope    recurrent  . Uterine perforation    hx of    Patient Active Problem List   Diagnosis Date Noted  . Anxiety 10/23/2019  . Dizziness 07/02/2019  . Tremor of both hands 03/27/2018  . Syncopal episodes 09/10/2017  . HTN (hypertension) 05/09/2017  . Menorrhagia 11/11/2016  . Alopecia 03/02/2016  . Bilateral cataracts 08/14/2015  . Acne 10/21/2014  . Liver lesion 09/09/2014  . Vitamin D deficiency 09/09/2014  . Hot flashes 06/10/2014  . Chronic pain syndrome 05/20/2014  . Vitamin B12 deficiency 03/12/2014  . Contraception management 09/20/2013  . IBS (irritable bowel syndrome) 09/20/2013  . Unspecified vitamin D deficiency 04/03/2013  . HLD (hyperlipidemia) 04/03/2013  . Osteopenia 04/03/2013  . POTS (postural orthostatic tachycardia syndrome) 02/26/2013  . Migraine 11/29/2012  . Allergic rhinitis 10/05/2012  . Mixed bipolar I  disorder in remission ([place]) 10/05/2012  . Combined fat and carbohydrate induced hyperlipemia 10/05/2012  . Fibromyalgia   . Bipolar disorder ([place])   . Fatigue 09/10/2011  . Clinical depression 09/10/2011  . Acid reflux 09/10/2011  . Non-restorative sleep 09/10/2011  . Disturbance in sleep behavior 09/10/2011    Past Surgical History:  Procedure Laterality Date  . CATARACT EXTRACTION  dec 2014  . DILATION AND CURETTAGE OF UTERUS    . LASIK  dec 2014    Prior to Admission medications   Medication Sig Start Date End Date Taking? Authorizing Provider  acetaminophen (TYLENOL) 500 MG tablet Take 1,000 mg by mouth daily as needed for mild pain.    [provider]  buPROPion (WELLBUTRIN XL) 150 MG 24 hr tablet Take 1 tablet (150 mg total) by mouth daily. 07/17/19   Lucille Passy, MD  cholecalciferol (VITAMIN D) 1000 units tablet Take 5,000 Units by mouth daily.    [provider]  clonazePAM (KLONOPIN) 1 MG tablet Take 1 tablet (1 mg total) by mouth 2 (two) times daily as needed. for anxiety 05/26/16   Lucille Passy, MD  diclofenac sodium (VOLTAREN) 1 % GEL Apply to affected area three to four times daily. 01/27/17   Lucille Passy, MD  fluvoxaMINE (LUVOX) 50 MG tablet Take 50-100 mg by mouth at bedtime.  05/23/17   [provider]  gabapentin (NEURONTIN) 600 MG tablet Take 1 tablet (600 mg  total) by mouth 3 (three) times daily. 07/17/19   Lucille Passy, MD  HYDROcodone-acetaminophen (NORCO/VICODIN) 5-325 MG tablet Take 1 tablet by mouth every 6 (six) hours as needed for moderate pain. 07/17/19   Lucille Passy, MD  methocarbamol (ROBAXIN) 500 MG tablet Take 1 tablet (500 mg total) by mouth 4 (four) times daily. 08/28/19   Lesleigh Noe, MD  norgestimate-ethinyl estradiol (SPRINTEC 28) 0.25-35 MG-MCG tablet Take 1 tablet by mouth daily. 07/17/19   Lucille Passy, MD  ondansetron (ZOFRAN-ODT) 4 MG disintegrating tablet Take 1 tablet (4 mg total) by mouth every 8 (eight) hours as  needed for nausea or vomiting. 07/17/19   Lucille Passy, MD  verapamil (CALAN-SR) 120 MG CR tablet Take 1 tablet (120 mg total) by mouth at bedtime. 07/17/19   Lucille Passy, MD  vitamin B-12 (CYANOCOBALAMIN) 1000 MCG tablet Take 1,000 mcg by mouth daily.    [provider]    Allergies Lamotrigine, Abilify [aripiprazole], Azithromycin, Depakote [divalproex sodium], Erythromycin base, Lithium, Penicillins, Pregabalin, Seroquel [quetiapine fumarate], Sulfa antibiotics, Tetanus toxoids, Tramadol, and Ziprasidone hcl  Family History  Problem Relation Age of Onset  . Diabetes Mother   . Hypertension Mother   . Brain cancer Father   . Ovarian cancer Maternal Grandmother        metastasized  . Lung cancer Maternal Grandfather   . Cancer Paternal Grandmother        not sure of the type  . Cancer Paternal Grandfather        not sure of the type  . Migraines Neg Hx     Social History Social History   Tobacco Use  . Smoking status: Never Smoker  . Smokeless tobacco: Never Used  Vaping Use  . Vaping Use: Never used  Substance Use Topics  . Alcohol use: No    Alcohol/week: 0.0 standard drinks  . Drug use: No    Review of Systems Constitutional: No fever/chills Eyes: No visual changes. ENT: No sore throat. Cardiovascular: Denies chest pain. Respiratory: Denies shortness of breath. Gastrointestinal: No abdominal pain.  No nausea, no vomiting.  No diarrhea.  No constipation. Genitourinary: Negative for dysuria. Musculoskeletal: Negative for back pain. Skin: Negative for rash. Neurological: Positive for headaches, but denies focal weakness or numbness. Psychiatric:  Anxiety and bipolar  Allergic/Immunilogical: See extensive medication allergy list.  ____________________________________________   PHYSICAL EXAM:  VITAL SIGNS: ED Triage Vitals [11/30/19 0728]  Enc Vitals Group     BP (!) 126/43     Pulse Rate 94     Resp 16     Temp 98.9 F (37.2 C)     Temp Source  Oral     SpO2 95 %     Weight 155 lb (70.3 kg)     Height 5\' 9"  (1.753 m)     Head Circumference      Peak Flow      Pain Score 8     Pain Loc      Pain Edu?      Excl. in [place]?     Constitutional: Alert and oriented. Well appearing and in no acute distress. Eyes: Conjunctivae are normal. PERRL. EOMI. mild photophobia Head: Atraumatic. Nose: No congestion/rhinnorhea. Mouth/Throat: Mucous membranes are moist.  Oropharynx non-erythematous. Neck:No cervical spine tenderness to palpation. Cardiovascular: Normal rate, regular rhythm. Grossly normal heart sounds.  Good peripheral circulation. Respiratory: Normal respiratory effort.  No retractions. Lungs CTAB. Gastrointestinal: Soft and nontender. No distention. No abdominal bruits.  No CVA tenderness. Genitourinary: Deferred Neurologic:  Normal speech and language. No gross focal neurologic deficits are appreciated. No gait instability. Skin:  Skin is warm, dry and intact. No rash noted. Psychiatric: Mood and affect are normal. Speech and behavior are normal.  ____________________________________________   LABS (all labs ordered are listed, but only abnormal results are displayed)  Labs Reviewed - No data to display ____________________________________________  EKG   ____________________________________________  RADIOLOGY  ED MD interpretation:    Official radiology report(s): No results found.  ____________________________________________   PROCEDURES  Procedure(s) performed (including Critical Care):  Procedures   ____________________________________________   INITIAL IMPRESSION / ASSESSMENT AND PLAN / ED COURSE  As part of my medical decision making, I reviewed the following data within the [facility]     Patient presents with 3 days of migraine headache.  Patient states this episode was refractory to her normal medications.  Patient responded well to IV rehydration, Zofran, Reglan, and  Toradol.  Patient given discharge care instruction advised follow-up PCP.    ALLIX BLOMQUIST was evaluated in Emergency Department on 11/30/2019 for the symptoms described in the history of present illness. She was evaluated in the context of the global COVID-19 pandemic, which necessitated consideration that the patient might be at risk for infection with the SARS-CoV-2 virus that causes COVID-19. Institutional protocols and algorithms that pertain to the evaluation of patients at risk for COVID-19 are in a state of rapid change based on information released by regulatory bodies including the CDC and federal and state organizations. These policies and algorithms were followed during the patient's care in the ED.       ____________________________________________   FINAL CLINICAL IMPRESSION(S) / ED DIAGNOSES  Final diagnoses:  Migraine without aura and with status migrainosus, not intractable     ED Discharge Orders    None       Note:  This document was prepared using Dragon voice recognition software and may include unintentional dictation errors.    Sable Feil, PA-C 11/30/19 1012    Harvest Dark, MD 11/30/19 1443

## 2019-11-30 NOTE — ED Triage Notes (Signed)
Says migraine headachesince tuesday with history of same.  Says she tried her migraine meds, benadryl, tylenol without relief.  Says it did eas on Wednesday and then got worse again.  Her doctor told her to come to the ED.

## 2019-11-30 NOTE — ED Notes (Signed)
See triage note  Presents with migraine  States h/a started on Tuesday    States she has a hx of same

## 2019-11-30 NOTE — Discharge Instructions (Addendum)
Follow discharge care instructions and continue previous medications. °

## 2019-12-03 DIAGNOSIS — F411 Generalized anxiety disorder: Secondary | ICD-10-CM | POA: Diagnosis not present

## 2019-12-03 DIAGNOSIS — F3161 Bipolar disorder, current episode mixed, mild: Secondary | ICD-10-CM | POA: Diagnosis not present

## 2019-12-11 ENCOUNTER — Ambulatory Visit (INDEPENDENT_AMBULATORY_CARE_PROVIDER_SITE_OTHER): Payer: Medicare HMO | Admitting: Internal Medicine

## 2019-12-11 ENCOUNTER — Other Ambulatory Visit: Payer: Self-pay

## 2019-12-11 ENCOUNTER — Encounter: Payer: Self-pay | Admitting: Internal Medicine

## 2019-12-11 VITALS — BP 130/72 | HR 107 | Ht 69.0 in | Wt 159.2 lb

## 2019-12-11 DIAGNOSIS — I498 Other specified cardiac arrhythmias: Secondary | ICD-10-CM | POA: Diagnosis not present

## 2019-12-11 DIAGNOSIS — K580 Irritable bowel syndrome with diarrhea: Secondary | ICD-10-CM

## 2019-12-11 DIAGNOSIS — G43801 Other migraine, not intractable, with status migrainosus: Secondary | ICD-10-CM | POA: Diagnosis not present

## 2019-12-11 DIAGNOSIS — I10 Essential (primary) hypertension: Secondary | ICD-10-CM

## 2019-12-11 DIAGNOSIS — G90A Postural orthostatic tachycardia syndrome (POTS): Secondary | ICD-10-CM

## 2019-12-11 NOTE — Assessment & Plan Note (Signed)
Drink plenty  Of water

## 2019-12-11 NOTE — Assessment & Plan Note (Signed)
Will ref to neurology

## 2019-12-11 NOTE — Addendum Note (Signed)
Addended by: Alois Cliche on: 12/11/2019 10:46 AM   Modules accepted: Orders

## 2019-12-11 NOTE — Assessment & Plan Note (Signed)
Patient has irritable bowel syndrome in the form of diarrhea intermittently.  At the present time she is suffering from the migraine headache and not having GI issues.

## 2019-12-11 NOTE — Progress Notes (Signed)
Established Patient Office Visit  SUBJECTIVE:  Subjective  Patient ID: Melissa [name], female    DOB: 1971-02-17  Age: 49 y.o. MRN: [id number]  CC:  Chief Complaint  Patient presents with   Migraine    patient requesting referral to a neurologist     HPI Melissa [name] is a 49 y.o. female presenting today for migraine headache. She has headache  sinc 20 yr c/o nausea . It starts from back of head and start seeing spots in front  Of botheyes  Today she reports  Past Medical History:  Diagnosis Date   Arthritis    Bipolar disorder ([place])    Fibromyalgia    on disability   Migraine    Muscle spasm    Pain of right side of body    chronic   Panic attack    POTS (postural orthostatic tachycardia syndrome)    Syncope    recurrent   Uterine perforation    hx of    Past Surgical History:  Procedure Laterality Date   CATARACT EXTRACTION  dec 2014   DILATION AND CURETTAGE OF UTERUS     LASIK  dec 2014    Family History  Problem Relation Age of Onset   Diabetes Mother    Hypertension Mother    Brain cancer Father    Ovarian cancer Maternal Grandmother        metastasized   Lung cancer Maternal Grandfather    Cancer Paternal Grandmother        not sure of the type   Cancer Paternal Grandfather        not sure of the type   Migraines Neg Hx     Social History   Socioeconomic History   Marital status: Married    Spouse name: Izell [place]   Number of children: 0   Years of education: some college   Highest education level: Not on file  Occupational History    Employer: OTHER    Comment: disabled  Tobacco Use   Smoking status: Never Smoker   Smokeless tobacco: Never Used  Scientific laboratory technician Use: Never used  Substance and Sexual Activity   Alcohol use: No    Alcohol/week: 0.0 standard drinks   Drug use: No   Sexual activity: Yes    Birth control/protection: Surgical    Comment: also on OCPs  Other Topics Concern   Not on file   Social History Narrative   08/28/19   From: [place], moved because her husband was from [place]   Living: with husband Izell [place], since 1990   Work: disability due to fibromyalgia and bipolar disorder      Family: mother in law is nearby, her mother is in [place]; has a sister      Enjoys: crochet, baking, reading      Exercise: walking the dog, yoga - tries to do 3 times a week   Diet: not great, more sweets than she should      Safety   Seat belts: Yes    Guns: Yes  and secure   Safe in relationships: Yes       Social Determinants of Health   Financial Resource Strain:    Difficulty of Paying Living Expenses:   Food Insecurity:    Worried About Charity fundraiser in the Last Year:    Arboriculturist in the Last Year:   Transportation Needs:    Film/video editor (Medical):  Lack of Transportation (Non-Medical):   Physical Activity:    Days of Exercise per Week:    Minutes of Exercise per Session:   Stress:    Feeling of Stress :   Social Connections:    Frequency of Communication with Friends and Family:    Frequency of Social Gatherings with Friends and Family:    Attends Religious Services:    Active Member of Clubs or Organizations:    Attends Music therapist:    Marital Status:   Intimate Partner Violence:    Fear of Current or Ex-Partner:    Emotionally Abused:    Physically Abused:    Sexually Abused:      Current Outpatient Medications:    acetaminophen (TYLENOL) 500 MG tablet, Take 1,000 mg by mouth daily as needed for mild pain., Disp: , Rfl:    buPROPion (WELLBUTRIN XL) 150 MG 24 hr tablet, Take 1 tablet (150 mg total) by mouth daily., Disp: 90 tablet, Rfl: 3   cholecalciferol (VITAMIN D) 1000 units tablet, Take 5,000 Units by mouth daily., Disp: , Rfl:    clonazePAM (KLONOPIN) 1 MG tablet, Take 1 tablet (1 mg total) by mouth 2 (two) times daily as needed. for anxiety, Disp: 60 tablet, Rfl: 0   diclofenac sodium  (VOLTAREN) 1 % GEL, Apply to affected area three to four times daily., Disp: 100 g, Rfl: 0   fluvoxaMINE (LUVOX) 50 MG tablet, Take 50-100 mg by mouth at bedtime. , Disp: , Rfl:    gabapentin (NEURONTIN) 600 MG tablet, Take 1 tablet (600 mg total) by mouth 3 (three) times daily., Disp: 270 tablet, Rfl: 3   HYDROcodone-acetaminophen (NORCO/VICODIN) 5-325 MG tablet, Take 1 tablet by mouth every 6 (six) hours as needed for moderate pain., Disp: 60 tablet, Rfl: 0   methocarbamol (ROBAXIN) 500 MG tablet, Take 1 tablet (500 mg total) by mouth 4 (four) times daily., Disp: 60 tablet, Rfl: 1   norgestimate-ethinyl estradiol (SPRINTEC 28) 0.25-35 MG-MCG tablet, Take 1 tablet by mouth daily., Disp: 6 Package, Rfl: 1   ondansetron (ZOFRAN-ODT) 4 MG disintegrating tablet, Take 1 tablet (4 mg total) by mouth every 8 (eight) hours as needed for nausea or vomiting., Disp: 20 tablet, Rfl: 11   verapamil (CALAN-SR) 120 MG CR tablet, Take 1 tablet (120 mg total) by mouth at bedtime., Disp: 90 tablet, Rfl: 3   vitamin B-12 (CYANOCOBALAMIN) 1000 MCG tablet, Take 1,000 mcg by mouth daily., Disp: , Rfl:    Allergies  Allergen Reactions   Lamotrigine Hives and Swelling   Abilify [Aripiprazole] Other (See Comments)    Weight gain, did not help,    Azithromycin Rash   Depakote [Divalproex Sodium] Other (See Comments)    Hair loss   Erythromycin Base Swelling   Lithium Rash   Penicillins Rash    Has patient had a PCN reaction causing immediate rash, facial/tongue/throat swelling, SOB or lightheadedness with hypotension: Yes- swelling, rash  Has patient had a PCN reaction causing severe rash involving mucus membranes or skin necrosis: Yes Has patient had a PCN reaction that required hospitalization No Has patient had a PCN reaction occurring within the last 10 years: No If all of the above answers are "NO", then may proceed with Cephalosporin use.    Pregabalin Palpitations    Manic symptoms, no  sleep x's 3 days, no appetite.   Seroquel [Quetiapine Fumarate] Other (See Comments)    Irritable   Sulfa Antibiotics Rash   Tetanus Toxoids Other (See Comments)  Swelling to injection site   Tramadol Nausea And Vomiting   Ziprasidone Hcl Other (See Comments)    Heat/cold intolerance.     ROS Review of Systems  Constitutional: Negative.   HENT: Negative.   Eyes: Negative.   Respiratory: Negative.   Cardiovascular: Negative.   Gastrointestinal: Negative.   Endocrine: Negative.   Genitourinary: Negative.   Musculoskeletal: Negative.   Skin: Negative.   Allergic/Immunologic: Negative.   Neurological: Negative.   Hematological: Negative.   Psychiatric/Behavioral: Negative.   All other systems reviewed and are negative.    OBJECTIVE:    Physical Exam Vitals reviewed.  Constitutional:      Appearance: Normal appearance.  HENT:     Mouth/Throat:     Mouth: Mucous membranes are moist.  Eyes:     Pupils: Pupils are equal, round, and reactive to light.  Cardiovascular:     Rate and Rhythm: Normal rate and regular rhythm.     Pulses: Normal pulses.     Heart sounds: Normal heart sounds.  Pulmonary:     Effort: Pulmonary effort is normal.     Breath sounds: Normal breath sounds. No wheezing or rhonchi.  Abdominal:     Palpations: There is no hepatomegaly, splenomegaly or mass.     Tenderness: There is no abdominal tenderness.  Musculoskeletal:     Right lower leg: No edema.     Left lower leg: No edema.  Neurological:     Mental Status: She is alert and oriented to person, place, and time.     Cranial Nerves: No cranial nerve deficit.     Motor: No weakness.     Gait: Gait normal.  Psychiatric:        Mood and Affect: Mood and affect normal.        Behavior: Behavior normal.     BP 130/72    Pulse (!) 107    Ht 5\' 9"  (1.753 m)    Wt 159 lb 3.2 oz (72.2 kg)    BMI 23.51 kg/m  Wt Readings from Last 3 Encounters:  12/11/19 159 lb 3.2 oz (72.2 kg)  11/30/19  155 lb (70.3 kg)  10/23/19 156 lb (70.8 kg)    Health Maintenance Due  Topic Date Due   Hepatitis C Screening  Never done   COLONOSCOPY  Never done    There are no preventive care reminders to display for this patient.  CBC Latest Ref Rng & Units 06/26/2019 12/09/2017 09/08/2017  WBC 4.0 - 10.5 K/uL 9.5 9.8 8.8  Hemoglobin 12.0 - 15.0 g/dL 13.8 14.7 14.3  Hematocrit 36 - 46 % 42.1 43.2 43.1  Platelets 150 - 400 K/uL 293 286 296   CMP Latest Ref Rng & Units 06/26/2019 12/09/2017 09/08/2017  Glucose 70 - 99 mg/dL 91 121(H) 86  BUN 6 - 20 mg/dL 12 11 6   Creatinine 0.44 - 1.00 mg/dL 0.94 0.86 0.90  Sodium 135 - 145 mmol/L 138 133(L) 140  Potassium 3.5 - 5.1 mmol/L 3.9 3.9 4.2  Chloride 98 - 111 mmol/L 104 104 107  CO2 22 - 32 mmol/L 23 19(L) 24  Calcium 8.9 - 10.3 mg/dL 8.8(L) 8.4(L) 8.9  Total Protein 6.0 - 8.3 g/dL - - -  Total Bilirubin 0.2 - 1.2 mg/dL - - -  Alkaline Phos 39 - 117 U/L - - -  AST 0 - 37 U/L - - -  ALT 0 - 35 U/L - - -    Lab Results  Component Value Date  TSH 0.98 05/09/2017   Lab Results  Component Value Date   ALBUMIN 4.2 05/09/2017   ANIONGAP 11 06/26/2019   GFR 68.49 05/09/2017   Lab Results  Component Value Date   CHOL 245 (H) 06/24/2016   CHOL 245 (H) 02/26/2013   CHOL 203 (H) 02/04/2012   HDL 40.20 06/24/2016   HDL 34.20 (L) 02/26/2013   HDL 29 (L) 02/04/2012   LDLCALC 114 (H) 02/04/2012   CHOLHDL 6 06/24/2016   CHOLHDL 7 02/26/2013   Lab Results  Component Value Date   TRIG 363.0 (H) 06/24/2016   No results found for: HGBA1C    ASSESSMENT & PLAN:   Problem List Items Addressed This Visit      Cardiovascular and Mediastinum   Migraine    Will ref to neurology      POTS (postural orthostatic tachycardia syndrome)    Drink plenty  Of water      HTN (hypertension) - Primary    - Today, the patient's blood pressure is not well managed on verapamil. - The patient will continue the current treatment regimen.  - I encouraged  the patient to eat a low-sodium diet to help control blood pressure. - I encouraged the patient to live an active lifestyle and complete activities that increases heart rate to 85% target heart rate at least 5 times per week for one hour.           Digestive   IBS (irritable bowel syndrome)    Patient has irritable bowel syndrome in the form of diarrhea intermittently.  At the present time she is suffering from the migraine headache and not having GI issues.         No orders of the defined types were placed in this encounter.     Follow-up: Return in about 8 weeks (around 02/05/2020).    Dr. Jane Canary [facility] [address], [place], [place] [address]   By signing my name below, I, Milinda Antis, attest that this documentation has been prepared under the direction and in the presence of Cletis Athens, MD. Electronically Signed: Cletis Athens, MD 12/11/19, 9:10 AM   I personally performed the services described in this documentation, which was SCRIBED in my presence. The recorded information has been reviewed and considered accurate. It has been edited as necessary during review. Cletis Athens, MD

## 2019-12-11 NOTE — Assessment & Plan Note (Signed)
-   Today, the patient's blood pressure is not well managed on verapamil. - The patient will continue the current treatment regimen.  - I encouraged the patient to eat a low-sodium diet to help control blood pressure. - I encouraged the patient to live an active lifestyle and complete activities that increases heart rate to 85% target heart rate at least 5 times per week for one hour.

## 2020-01-03 DIAGNOSIS — F3162 Bipolar disorder, current episode mixed, moderate: Secondary | ICD-10-CM | POA: Diagnosis not present

## 2020-01-03 DIAGNOSIS — F411 Generalized anxiety disorder: Secondary | ICD-10-CM | POA: Diagnosis not present

## 2020-01-24 ENCOUNTER — Ambulatory Visit: Payer: Medicare HMO | Admitting: Internal Medicine

## 2020-01-25 ENCOUNTER — Ambulatory Visit (INDEPENDENT_AMBULATORY_CARE_PROVIDER_SITE_OTHER): Payer: Medicare HMO | Admitting: Internal Medicine

## 2020-01-25 ENCOUNTER — Other Ambulatory Visit: Payer: Self-pay

## 2020-01-25 ENCOUNTER — Encounter: Payer: Self-pay | Admitting: Internal Medicine

## 2020-01-25 VITALS — BP 130/85 | HR 103 | Wt 159.4 lb

## 2020-01-25 DIAGNOSIS — K219 Gastro-esophageal reflux disease without esophagitis: Secondary | ICD-10-CM | POA: Diagnosis not present

## 2020-01-25 DIAGNOSIS — E559 Vitamin D deficiency, unspecified: Secondary | ICD-10-CM | POA: Diagnosis not present

## 2020-01-25 DIAGNOSIS — G43801 Other migraine, not intractable, with status migrainosus: Secondary | ICD-10-CM

## 2020-01-25 DIAGNOSIS — E785 Hyperlipidemia, unspecified: Secondary | ICD-10-CM

## 2020-01-25 DIAGNOSIS — M797 Fibromyalgia: Secondary | ICD-10-CM

## 2020-01-25 DIAGNOSIS — I10 Essential (primary) hypertension: Secondary | ICD-10-CM | POA: Diagnosis not present

## 2020-01-25 MED ORDER — HYDROCODONE-ACETAMINOPHEN 5-325 MG PO TABS: 1.0000 | ORAL_TABLET | Freq: Four times a day (QID) | ORAL | 0 refills | Status: DC | PRN

## 2020-01-25 NOTE — Progress Notes (Signed)
Established Patient Office Visit  SUBJECTIVE:  Subjective  Patient ID: Melissa [name], female    DOB: Jul 29, 1970  Age: 49 y.o. MRN: [id number]  CC:  Chief Complaint  Patient presents with  . Hypertension    2 month BP follow up  . Arthritis    HPI Melissa [name] is a 49 y.o. female presenting today for a hypertension check.   Hypertension This is a chronic problem. The current episode started more than 1 year ago. The problem is controlled. Associated symptoms include headaches. Risk factors for coronary artery disease include family history.  Arthritis Presents for initial visit. She complains of pain and stiffness. The symptoms have been worsening. Affected locations include the right wrist, left wrist, right knee and left knee. Associated symptoms include pain at night. Her pertinent risk factors include overuse (frequent repetitive use of hands). Her family medical history includes family history of rheumatoid arthritis. Past treatments include acetaminophen and NSAIDs. The treatment provided mild relief. Factors aggravating her arthritis include activity and gripping. Side effects of treatment include headaches and joint pain.  Migraine  This is a recurrent (Upcoming neurology appointment on 02/11/2020.) problem. The current episode started more than 1 month ago. The problem occurs intermittently. The problem has been waxing and waning. The pain does not radiate. The pain quality is similar to prior headaches. The quality of the pain is described as pulsating. The pain is severe. Associated symptoms include eye pain, nausea, phonophobia, photophobia and scalp tenderness. She has tried NSAIDs and darkened room for the symptoms. The treatment provided mild relief. Her past medical history is significant for hypertension and migraine headaches.    Past Medical History:  Diagnosis Date  . Arthritis   . Bipolar disorder ([place])   . Fibromyalgia    on disability  . Migraine   . Muscle  spasm   . Pain of right side of body    chronic  . Panic attack   . POTS (postural orthostatic tachycardia syndrome)   . Syncope    recurrent  . Uterine perforation    hx of    Past Surgical History:  Procedure Laterality Date  . CATARACT EXTRACTION  dec 2014  . DILATION AND CURETTAGE OF UTERUS    . LASIK  dec 2014    Family History  Problem Relation Age of Onset  . Diabetes Mother   . Hypertension Mother   . Brain cancer Father   . Ovarian cancer Maternal Grandmother        metastasized  . Lung cancer Maternal Grandfather   . Cancer Paternal Grandmother        not sure of the type  . Cancer Paternal Grandfather        not sure of the type  . Migraines Neg Hx     Social History   Socioeconomic History  . Marital status: Married    Spouse name: Izell Harlan  . Number of children: 0  . Years of education: some college  . Highest education level: Not on file  Occupational History    Employer: OTHER    Comment: disabled  Tobacco Use  . Smoking status: Never Smoker  . Smokeless tobacco: Never Used  Vaping Use  . Vaping Use: Never used  Substance and Sexual Activity  . Alcohol use: No    Alcohol/week: 0.0 standard drinks  . Drug use: No  . Sexual activity: Yes    Birth control/protection: Surgical    Comment: also on OCPs  Other Topics Concern  . Not on file  Social History Narrative   08/28/19   From: [place], moved because her husband was from [facility]   Living: with husband Izell [place], since 1990   Work: disability due to fibromyalgia and bipolar disorder      Family: mother in law is nearby, her mother is in [place]; has a sister      Enjoys: crochet, baking, reading      Exercise: walking the dog, yoga - tries to do 3 times a week   Diet: not great, more sweets than she should      Safety   Seat belts: Yes    Guns: Yes  and secure   Safe in relationships: Yes       Social Determinants of Health   Financial Resource Strain:   . Difficulty of Paying Living  Expenses:   Food Insecurity:   . Worried About Charity fundraiser in the Last Year:   . Arboriculturist in the Last Year:   Transportation Needs:   . Film/video editor (Medical):   Marland Kitchen Lack of Transportation (Non-Medical):   Physical Activity:   . Days of Exercise per Week:   . Minutes of Exercise per Session:   Stress:   . Feeling of Stress :   Social Connections:   . Frequency of Communication with Friends and Family:   . Frequency of Social Gatherings with Friends and Family:   . Attends Religious Services:   . Active Member of Clubs or Organizations:   . Attends Archivist Meetings:   Marland Kitchen Marital Status:   Intimate Partner Violence:   . Fear of Current or Ex-Partner:   . Emotionally Abused:   Marland Kitchen Physically Abused:   . Sexually Abused:      Current Outpatient Medications:  .  acetaminophen (TYLENOL) 500 MG tablet, Take 1,000 mg by mouth daily as needed for mild pain., Disp: , Rfl:  .  buPROPion (WELLBUTRIN XL) 150 MG 24 hr tablet, Take 1 tablet (150 mg total) by mouth daily., Disp: 90 tablet, Rfl: 3 .  cholecalciferol (VITAMIN D) 1000 units tablet, Take 5,000 Units by mouth daily., Disp: , Rfl:  .  clonazePAM (KLONOPIN) 1 MG tablet, Take 1 tablet (1 mg total) by mouth 2 (two) times daily as needed. for anxiety, Disp: 60 tablet, Rfl: 0 .  diclofenac sodium (VOLTAREN) 1 % GEL, Apply to affected area three to four times daily., Disp: 100 g, Rfl: 0 .  fluvoxaMINE (LUVOX) 50 MG tablet, Take 50-100 mg by mouth at bedtime. , Disp: , Rfl:  .  gabapentin (NEURONTIN) 600 MG tablet, Take 1 tablet (600 mg total) by mouth 3 (three) times daily., Disp: 270 tablet, Rfl: 3 .  HYDROcodone-acetaminophen (NORCO/VICODIN) 5-325 MG tablet, Take 1 tablet by mouth every 6 (six) hours as needed for moderate pain., Disp: 20 tablet, Rfl: 0 .  methocarbamol (ROBAXIN) 500 MG tablet, Take 1 tablet (500 mg total) by mouth 4 (four) times daily., Disp: 60 tablet, Rfl: 1 .  norgestimate-ethinyl  estradiol (SPRINTEC 28) 0.25-35 MG-MCG tablet, Take 1 tablet by mouth daily., Disp: 6 Package, Rfl: 1 .  ondansetron (ZOFRAN-ODT) 4 MG disintegrating tablet, Take 1 tablet (4 mg total) by mouth every 8 (eight) hours as needed for nausea or vomiting., Disp: 20 tablet, Rfl: 11 .  verapamil (CALAN-SR) 120 MG CR tablet, Take 1 tablet (120 mg total) by mouth at bedtime., Disp: 90 tablet, Rfl: 3 .  vitamin  B-12 (CYANOCOBALAMIN) 1000 MCG tablet, Take 1,000 mcg by mouth daily., Disp: , Rfl:    Allergies  Allergen Reactions  . Lamotrigine Hives and Swelling  . Abilify [Aripiprazole] Other (See Comments)    Weight gain, did not help,   . Azithromycin Rash  . Depakote [Divalproex Sodium] Other (See Comments)    Hair loss  . Erythromycin Base Swelling  . Lithium Rash  . Penicillins Rash    Has patient had a PCN reaction causing immediate rash, facial/tongue/throat swelling, SOB or lightheadedness with hypotension: Yes- swelling, rash  Has patient had a PCN reaction causing severe rash involving mucus membranes or skin necrosis: Yes Has patient had a PCN reaction that required hospitalization No Has patient had a PCN reaction occurring within the last 10 years: No If all of the above answers are "NO", then may proceed with Cephalosporin use.   . Pregabalin Palpitations    Manic symptoms, no sleep x's 3 days, no appetite.  . Seroquel [Quetiapine Fumarate] Other (See Comments)    Irritable  . Sulfa Antibiotics Rash  . Tetanus Toxoids Other (See Comments)    Swelling to injection site  . Tramadol Nausea And Vomiting  . Ziprasidone Hcl Other (See Comments)    Heat/cold intolerance.     ROS Review of Systems  Constitutional: Negative.   HENT: Negative.   Eyes: Positive for photophobia and pain.  Respiratory: Negative.   Cardiovascular: Negative.   Gastrointestinal: Positive for nausea.  Endocrine: Negative.   Genitourinary: Negative.   Musculoskeletal: Positive for arthralgias, arthritis,  myalgias and stiffness.  Skin: Negative.   Allergic/Immunologic: Negative.   Neurological: Positive for headaches.  Hematological: Negative.   Psychiatric/Behavioral: Negative.   All other systems reviewed and are negative.    OBJECTIVE:    Physical Exam Vitals reviewed.  Constitutional:      Appearance: Normal appearance.  HENT:     Mouth/Throat:     Mouth: Mucous membranes are moist.  Eyes:     Pupils: Pupils are equal, round, and reactive to light.  Neck:     Vascular: No carotid bruit.  Cardiovascular:     Rate and Rhythm: Normal rate and regular rhythm.     Pulses: Normal pulses.     Heart sounds: Normal heart sounds.  Pulmonary:     Effort: Pulmonary effort is normal.     Breath sounds: Normal breath sounds.  Abdominal:     General: Bowel sounds are normal.     Palpations: Abdomen is soft. There is no hepatomegaly, splenomegaly or mass.     Tenderness: There is no abdominal tenderness.     Hernia: No hernia is present.  Musculoskeletal:        General: No tenderness.     Cervical back: Neck supple.     Right lower leg: No edema.     Left lower leg: No edema.  Skin:    Findings: No rash.  Neurological:     Mental Status: She is alert and oriented to person, place, and time.     Motor: No weakness.  Psychiatric:        Mood and Affect: Mood and affect normal.        Behavior: Behavior normal.     BP 130/85   Pulse (!) 103   Wt 159 lb 6.4 oz (72.3 kg)   BMI 23.54 kg/m  Wt Readings from Last 3 Encounters:  01/25/20 159 lb 6.4 oz (72.3 kg)  12/11/19 159 lb 3.2 oz (72.2 kg)  11/30/19 155 lb (70.3 kg)    Health Maintenance Due  Topic Date Due  . Hepatitis C Screening  Never done  . COLONOSCOPY  Never done  . INFLUENZA VACCINE  01/20/2020    There are no preventive care reminders to display for this patient.  CBC Latest Ref Rng & Units 06/26/2019 12/09/2017 09/08/2017  WBC 4.0 - 10.5 K/uL 9.5 9.8 8.8  Hemoglobin 12.0 - 15.0 g/dL 13.8 14.7 14.3    Hematocrit 36 - 46 % 42.1 43.2 43.1  Platelets 150 - 400 K/uL 293 286 296   CMP Latest Ref Rng & Units 06/26/2019 12/09/2017 09/08/2017  Glucose 70 - 99 mg/dL 91 121(H) 86  BUN 6 - 20 mg/dL 12 11 6   Creatinine 0.44 - 1.00 mg/dL 0.94 0.86 0.90  Sodium 135 - 145 mmol/L 138 133(L) 140  Potassium 3.5 - 5.1 mmol/L 3.9 3.9 4.2  Chloride 98 - 111 mmol/L 104 104 107  CO2 22 - 32 mmol/L 23 19(L) 24  Calcium 8.9 - 10.3 mg/dL 8.8(L) 8.4(L) 8.9  Total Protein 6.0 - 8.3 g/dL - - -  Total Bilirubin 0.2 - 1.2 mg/dL - - -  Alkaline Phos 39 - 117 U/L - - -  AST 0 - 37 U/L - - -  ALT 0 - 35 U/L - - -    Lab Results  Component Value Date   TSH 0.98 05/09/2017   Lab Results  Component Value Date   ALBUMIN 4.2 05/09/2017   ANIONGAP 11 06/26/2019   GFR 68.49 05/09/2017   Lab Results  Component Value Date   CHOL 245 (H) 06/24/2016   CHOL 245 (H) 02/26/2013   CHOL 203 (H) 02/04/2012   HDL 40.20 06/24/2016   HDL 34.20 (L) 02/26/2013   HDL 29 (L) 02/04/2012   LDLCALC 114 (H) 02/04/2012   CHOLHDL 6 06/24/2016   CHOLHDL 7 02/26/2013   Lab Results  Component Value Date   TRIG 363.0 (H) 06/24/2016   No results found for: HGBA1C    ASSESSMENT & PLAN:   Problem List Items Addressed This Visit      Cardiovascular and Mediastinum   Migraine - Primary    Patient migraine is getting worse I gave her20 pills of Vicodin due to get  better relief.  She was referred to the neurologist for definitive diagnosis and treatment.  Advised to continue gabapentin which will also relieve the headache.      Relevant Medications   HYDROcodone-acetaminophen (NORCO/VICODIN) 5-325 MG tablet   HTN (hypertension)    Verapamil is controlling the blood pressure good.        Digestive   Acid reflux    - The patient's GERD is stable on medication.  - Instructed the patient to avoid eating spicy and acidic foods, as well as foods high in fat. - Instructed the patient to avoid eating large meals or meals 2-3  hours prior to sleeping.         Other   Fibromyalgia    Complaining of pain in the both hands no obvious swelling of the interphalangeal joints.  Stiffness appears to be due to osteoarthritis, however   willcheck for RA.  Also she is being referred to the rheumatologist       Relevant Medications   HYDROcodone-acetaminophen (NORCO/VICODIN) 5-325 MG tablet   Dyslipidemia    He does not tolerate statin therapy.  Advised to follow low-cholesterol diet.      Vitamin D deficiency    Patient was  advised to take vitamin d 2000 units every day         Meds ordered this encounter  Medications  . HYDROcodone-acetaminophen (NORCO/VICODIN) 5-325 MG tablet    Sig: Take 1 tablet by mouth every 6 (six) hours as needed for moderate pain.    Dispense:  20 tablet    Refill:  0    March    Follow-up: No follow-ups on file.    Dr. Jane [facility] [address] [place], [place] [address]   By signing my name below, I, [employer], attest that this documentation has been prepared under the direction and in the presence of Cletis Athens, MD. Electronically Signed: Cletis Athens, MD 01/27/20, 3:35 PM  I personally performed the services described in this documentation, which was SCRIBED in my presence. The recorded information has been reviewed and considered accurate. It has been edited as necessary during review. Cletis Athens, MD

## 2020-01-27 ENCOUNTER — Encounter: Payer: Self-pay | Admitting: Internal Medicine

## 2020-01-27 NOTE — Assessment & Plan Note (Signed)
He does not tolerate statin therapy.  Advised to follow low-cholesterol diet.

## 2020-01-27 NOTE — Assessment & Plan Note (Signed)
Patient was advised to take vitamin d 2000 units every day

## 2020-01-27 NOTE — Assessment & Plan Note (Signed)
Verapamil is controlling the blood pressure good.

## 2020-01-27 NOTE — Assessment & Plan Note (Signed)
Patient migraine is getting worse I gave her20 pills of Vicodin due to get  better relief.  She was referred to the neurologist for definitive diagnosis and treatment.  Advised to continue gabapentin which will also relieve the headache.

## 2020-01-27 NOTE — Assessment & Plan Note (Signed)
-   The patient's GERD is stable on medication.  - Instructed the patient to avoid eating spicy and acidic foods, as well as foods high in fat. - Instructed the patient to avoid eating large meals or meals 2-3 hours prior to sleeping. 

## 2020-01-27 NOTE — Assessment & Plan Note (Addendum)
Complaining of pain in the both hands no obvious swelling of the interphalangeal joints.  Stiffness appears to be due to osteoarthritis, however   willcheck for RA.  Also she is being referred to the rheumatologist

## 2020-02-07 ENCOUNTER — Other Ambulatory Visit: Payer: Self-pay | Admitting: Family Medicine

## 2020-02-08 ENCOUNTER — Other Ambulatory Visit: Payer: Self-pay

## 2020-02-08 ENCOUNTER — Ambulatory Visit
Admission: EM | Admit: 2020-02-08 | Discharge: 2020-02-08 | Disposition: A | Discharge status: Home / Self Care | Payer: Medicare HMO | Attending: Emergency Medicine | Admitting: Emergency Medicine

## 2020-02-08 DIAGNOSIS — R519 Headache, unspecified: Secondary | ICD-10-CM

## 2020-02-08 MED ORDER — DEXAMETHASONE SODIUM PHOSPHATE 10 MG/ML IJ SOLN
10.0000 mg | Freq: Once | INTRAMUSCULAR | Status: AC
  Administered 2020-02-08: 10 mg via INTRAVENOUS

## 2020-02-08 MED ORDER — KETOROLAC TROMETHAMINE 30 MG/ML IJ SOLN
30.0000 mg | Freq: Once | INTRAMUSCULAR | Status: AC
  Administered 2020-02-08: 30 mg via INTRAMUSCULAR

## 2020-02-08 NOTE — ED Provider Notes (Signed)
Melissa [name]    CSN: [id number] Arrival date & time: 02/08/20  1227      History   Chief Complaint Chief Complaint  Patient presents with   Migraine    HPI Melissa [name] is a 49 y.o. female.   Patient presents with headache x1 day.  She is a history of migraines and states this feels like her usual migraine.  She attempted treatment at home with hydrocodone and Zofran.  She has an appointment to see a neurologist on 02/11/2020.  She denies unusual dizziness, focal weakness, numbness, chest pain, shortness of breath, abdominal pain, or other symptoms.  The history is provided by the patient.    Past Medical History:  Diagnosis Date   Arthritis    Bipolar disorder ([place])    Fibromyalgia    on disability   Migraine    Muscle spasm    Pain of right side of body    chronic   Panic attack    POTS (postural orthostatic tachycardia syndrome)    Syncope    recurrent   Uterine perforation    hx of    Patient Active Problem List   Diagnosis Date Noted   Anxiety 10/23/2019   Dizziness 07/02/2019   Tremor of both hands 03/27/2018   Syncopal episodes 09/10/2017   HTN (hypertension) 05/09/2017   Menorrhagia 11/11/2016   Alopecia 03/02/2016   Bilateral cataracts 08/14/2015   Acne 10/21/2014   Liver lesion 09/09/2014   Vitamin D deficiency 09/09/2014   Hot flashes 06/10/2014   Chronic pain syndrome 05/20/2014   Vitamin B12 deficiency 03/12/2014   Contraception management 09/20/2013   IBS (irritable bowel syndrome) 09/20/2013   Unspecified vitamin D deficiency 04/03/2013   Dyslipidemia 04/03/2013   Osteopenia 04/03/2013   POTS (postural orthostatic tachycardia syndrome) 02/26/2013   Migraine 11/29/2012   Allergic rhinitis 10/05/2012   Mixed bipolar I disorder in remission ([place]) 10/05/2012   Combined fat and carbohydrate induced hyperlipemia 10/05/2012   Fibromyalgia    Bipolar disorder ([place])    Fatigue 09/10/2011    Clinical depression 09/10/2011   Acid reflux 09/10/2011   Non-restorative sleep 09/10/2011   Disturbance in sleep behavior 09/10/2011    Past Surgical History:  Procedure Laterality Date   CATARACT EXTRACTION  dec 2014   DILATION AND CURETTAGE OF UTERUS     LASIK  dec 2014    OB History   No obstetric history on file.      Home Medications    Prior to Admission medications   Medication Sig Start Date End Date Taking? Authorizing Provider  acetaminophen (TYLENOL) 500 MG tablet Take 1,000 mg by mouth daily as needed for mild pain.    [provider]  buPROPion (WELLBUTRIN XL) 150 MG 24 hr tablet Take 1 tablet (150 mg total) by mouth daily. 07/17/19   Lucille Passy, MD  cholecalciferol (VITAMIN D) 1000 units tablet Take 5,000 Units by mouth daily.    [provider]  clonazePAM (KLONOPIN) 1 MG tablet Take 1 tablet (1 mg total) by mouth 2 (two) times daily as needed. for anxiety 05/26/16   Lucille Passy, MD  diclofenac sodium (VOLTAREN) 1 % GEL Apply to affected area three to four times daily. 01/27/17   Lucille Passy, MD  fluvoxaMINE (LUVOX) 50 MG tablet Take 50-100 mg by mouth at bedtime.  05/23/17   [provider]  gabapentin (NEURONTIN) 600 MG tablet Take 1 tablet (600 mg total) by mouth 3 (three) times  daily. 07/17/19   Lucille Passy, MD  HYDROcodone-acetaminophen (NORCO/VICODIN) 5-325 MG tablet Take 1 tablet by mouth every 6 (six) hours as needed for moderate pain. 01/25/20   Cletis Athens, MD  methocarbamol (ROBAXIN) 500 MG tablet Take 1 tablet (500 mg total) by mouth 4 (four) times daily. 08/28/19   Lesleigh Noe, MD  norgestimate-ethinyl estradiol (SPRINTEC 28) 0.25-35 MG-MCG tablet Take 1 tablet by mouth daily. 07/17/19   Lucille Passy, MD  ondansetron (ZOFRAN-ODT) 4 MG disintegrating tablet Take 1 tablet (4 mg total) by mouth every 8 (eight) hours as needed for nausea or vomiting. 07/17/19   Lucille Passy, MD  verapamil (CALAN-SR) 120 MG CR tablet Take  1 tablet (120 mg total) by mouth at bedtime. 07/17/19   Lucille Passy, MD  vitamin B-12 (CYANOCOBALAMIN) 1000 MCG tablet Take 1,000 mcg by mouth daily.    [provider]    Family History Family History  Problem Relation Age of Onset   Diabetes Mother    Hypertension Mother    Brain cancer Father    Ovarian cancer Maternal Grandmother        metastasized   Lung cancer Maternal Grandfather    Cancer Paternal Grandmother        not sure of the type   Cancer Paternal Grandfather        not sure of the type   Migraines Neg Hx     Social History Social History   Tobacco Use   Smoking status: Never Smoker   Smokeless tobacco: Never Used  Scientific laboratory technician Use: Never used  Substance Use Topics   Alcohol use: No    Alcohol/week: 0.0 standard drinks   Drug use: No     Allergies   Lamotrigine, Abilify [aripiprazole], Azithromycin, Depakote [divalproex sodium], Erythromycin base, Lithium, Penicillins, Pregabalin, Seroquel [quetiapine fumarate], Sulfa antibiotics, Tetanus toxoids, Tramadol, and Ziprasidone hcl   Review of Systems Review of Systems  Constitutional: Negative for chills and fever.  HENT: Negative for ear pain and sore throat.   Eyes: Negative for pain and visual disturbance.  Respiratory: Negative for cough and shortness of breath.   Cardiovascular: Negative for chest pain and palpitations.  Gastrointestinal: Negative for abdominal pain and vomiting.  Genitourinary: Negative for dysuria and hematuria.  Musculoskeletal: Negative for arthralgias and back pain.  Skin: Negative for color change and rash.  Neurological: Positive for headaches. Negative for seizures, syncope, facial asymmetry, weakness and numbness.  All other systems reviewed and are negative.    Physical Exam Triage Vital Signs ED Triage Vitals [02/08/20 1236]  Enc Vitals Group     BP      Pulse      Resp      Temp      Temp src      SpO2      Weight      Height       Head Circumference      Peak Flow      Pain Score 8     Pain Loc      Pain Edu?      Excl. in [place]?    No data found.  Updated Vital Signs BP 115/77 (BP Location: Right Arm)    Pulse (!) 101    Temp 98.7 F (37.1 C) (Oral)    Resp 18    LMP  (Within Weeks) Comment: 1 week    SpO2 96%   Visual Acuity Right Eye Distance:  Left Eye Distance:   Bilateral Distance:    Right Eye Near:   Left Eye Near:    Bilateral Near:     Physical Exam Vitals and nursing note reviewed.  Constitutional:      General: She is not in acute distress.    Appearance: She is well-developed.  HENT:     Head: Normocephalic and atraumatic.     Right Ear: Tympanic membrane normal.     Left Ear: Tympanic membrane normal.     Nose: Nose normal.     Mouth/Throat:     Mouth: Mucous membranes are moist.  Eyes:     Extraocular Movements: Extraocular movements intact.     Conjunctiva/sclera: Conjunctivae normal.     Pupils: Pupils are equal, round, and reactive to light.  Cardiovascular:     Rate and Rhythm: Normal rate and regular rhythm.     Heart sounds: No murmur heard.   Pulmonary:     Effort: Pulmonary effort is normal. No respiratory distress.     Breath sounds: Normal breath sounds.  Abdominal:     Palpations: Abdomen is soft.     Tenderness: There is no abdominal tenderness.  Musculoskeletal:     Cervical back: Neck supple.     Right lower leg: No edema.     Left lower leg: No edema.  Skin:    General: Skin is warm and dry.     Findings: No rash.  Neurological:     General: No focal deficit present.     Mental Status: She is alert and oriented to person, place, and time.     Cranial Nerves: No cranial nerve deficit.     Sensory: No sensory deficit.     Motor: No weakness.     Coordination: Coordination normal.     Gait: Gait normal.  Psychiatric:        Mood and Affect: Mood normal.        Behavior: Behavior normal.      UC Treatments / Results  Labs (all labs ordered are  listed, but only abnormal results are displayed) Labs Reviewed - No data to display  EKG   Radiology No results found.  Procedures Procedures (including critical care time)  Medications Ordered in UC Medications  dexamethasone (DECADRON) injection 10 mg (10 mg Intravenous Given 02/08/20 1309)  ketorolac (TORADOL) 30 MG/ML injection 30 mg (30 mg Intramuscular Given 02/08/20 1310)    Initial Impression / Assessment and Plan / UC Course  I have reviewed the triage vital signs and the nursing notes.  Pertinent labs & imaging results that were available during my care of the patient were reviewed by me and considered in my medical decision making (see chart for details).   Acute non-intractable headache.  Treated with Toradol and dexamethasone.  Instructed her to go to the ED if she has acute worsening symptoms.  Discussed that she should follow-up as scheduled with her neurologist on Monday.  Patient agrees to plan of care.   Final Clinical Impressions(s) / UC Diagnoses   Final diagnoses:  Acute nonintractable headache, unspecified headache type     Discharge Instructions     You were given an injection of Toradol and dexamethasone today.    Go to the emergency department if you have acute worsening symptoms.    Follow-up with your neurologist on Monday as scheduled.        ED Prescriptions    None     I have reviewed the PDMP  during this encounter.   Sharion Balloon, NP 02/08/20 1329

## 2020-02-08 NOTE — ED Triage Notes (Signed)
Pt reports having migraines on and off for the pats 3 months, worse since yesterday. States noise makes her migraine worse. Pt taking Zofran for nausea; Hydrocodone do not gives relief. . Pt has an appointment  with her Neurologist on 8/23/202. Reports the last migraine episode was over 2 years ago.

## 2020-02-08 NOTE — Discharge Instructions (Signed)
You were given an injection of Toradol and dexamethasone today.    Go to the emergency department if you have acute worsening symptoms.    Follow-up with your neurologist on Monday as scheduled.

## 2020-02-11 ENCOUNTER — Other Ambulatory Visit: Payer: Self-pay | Admitting: *Deleted

## 2020-02-11 ENCOUNTER — Encounter: Payer: Self-pay | Admitting: Neurology

## 2020-02-11 ENCOUNTER — Ambulatory Visit: Payer: Medicare HMO | Admitting: Neurology

## 2020-02-11 VITALS — BP 125/78 | HR 95 | Ht 69.0 in | Wt 160.0 lb

## 2020-02-11 DIAGNOSIS — G43709 Chronic migraine without aura, not intractable, without status migrainosus: Secondary | ICD-10-CM

## 2020-02-11 MED ORDER — METHOCARBAMOL 500 MG PO TABS: 500.0000 mg | ORAL_TABLET | Freq: Four times a day (QID) | ORAL | 1 refills | Status: DC

## 2020-02-11 MED ORDER — EMGALITY 120 MG/ML ~~LOC~~ SOAJ: 120.0000 mg | SUBCUTANEOUS | 11 refills | Status: DC

## 2020-02-11 MED ORDER — RIZATRIPTAN BENZOATE 10 MG PO TBDP: 10.0000 mg | ORAL_TABLET | ORAL | 11 refills | Status: DC | PRN

## 2020-02-11 NOTE — Patient Instructions (Signed)
Start Emgality monthly Rizatriptan: Please take one tablet at the onset of your headache. If it does not improve the symptoms please take one additional tablet. Do not take more then 2 tablets in 24hrs. Do not take use more then 2 to 3 times in a week.  Rizatriptan disintegrating tablets What is this medicine? RIZATRIPTAN (rye za TRIP tan) is used to treat migraines with or without aura. An aura is a strange feeling or visual disturbance that warns you of an attack. It is not used to prevent migraines. This medicine may be used for other purposes; ask your health care provider or pharmacist if you have questions. COMMON BRAND NAME(S): Maxalt-MLT What should I tell my health care provider before I take this medicine? They need to know if you have any of these conditions:  cigarette smoker  circulation problems in fingers and toes  diabetes  heart disease  high blood pressure  high cholesterol  history of irregular heartbeat  history of stroke  kidney disease  liver disease  stomach or intestine problems  an unusual or allergic reaction to rizatriptan, other medicines, foods, dyes, or preservatives  pregnant or trying to get pregnant  breast-feeding How should I use this medicine? Take this medicine by mouth. Follow the directions on the prescription label. Leave the tablet in the sealed blister pack until you are ready to take it. With dry hands, open the blister and gently remove the tablet. If the tablet breaks or crumbles, throw it away and take a new tablet out of the blister pack. Place the tablet in the mouth and allow it to dissolve, and then swallow. Do not cut, crush, or chew this medicine. You do not need water to take this medicine. Do not take it more often than directed. Talk to your pediatrician regarding the use of this medicine in children. While this drug may be prescribed for children as young as 6 years for selected conditions, precautions do  apply. Overdosage: If you think you have taken too much of this medicine contact a poison control center or emergency room at once. NOTE: This medicine is only for you. Do not share this medicine with others. What if I miss a dose? This does not apply. This medicine is not for regular use. What may interact with this medicine? Do not take this medicine with any of the following medicines:  certain medicines for migraine headache like almotriptan, eletriptan, frovatriptan, naratriptan, rizatriptan, sumatriptan, zolmitriptan  ergot alkaloids like dihydroergotamine, ergonovine, ergotamine, methylergonovine  MAOIs like Carbex, Eldepryl, Marplan, Nardil, and Parnate This medicine may also interact with the following medications:  certain medicines for depression, anxiety, or psychotic disorders  propranolol This list may not describe all possible interactions. Give your health care provider a list of all the medicines, herbs, non-prescription drugs, or dietary supplements you use. Also tell them if you smoke, drink alcohol, or use illegal drugs. Some items may interact with your medicine. What should I watch for while using this medicine? Visit your healthcare professional for regular checks on your progress. Tell your healthcare professional if your symptoms do not start to get better or if they get worse. You may get drowsy or dizzy. Do not drive, use machinery, or do anything that needs mental alertness until you know how this medicine affects you. Do not stand up or sit up quickly, especially if you are an older patient. This reduces the risk of dizzy or fainting spells. Alcohol may interfere with the effect of this  medicine. Your mouth may get dry. Chewing sugarless gum or sucking hard candy and drinking plenty of water may help. Contact your healthcare professional if the problem does not go away or is severe. If you take migraine medicines for 10 or more days a month, your migraines may get  worse. Keep a diary of headache days and medicine use. Contact your healthcare professional if your migraine attacks occur more frequently. What side effects may I notice from receiving this medicine? Side effects that you should report to your doctor or health care professional as soon as possible:  allergic reactions like skin rash, itching or hives, swelling of the face, lips, or tongue  chest pain or chest tightness  signs and symptoms of a dangerous change in heartbeat or heart rhythm like chest pain; dizziness; fast, irregular heartbeat; palpitations; feeling faint or lightheaded; falls; breathing problems  signs and symptoms of a stroke like changes in vision; confusion; trouble speaking or understanding; severe headaches; sudden numbness or weakness of the face, arm or leg; trouble walking; dizziness; loss of balance or coordination  signs and symptoms of serotonin syndrome like irritable; confusion; diarrhea; fast or irregular heartbeat; muscle twitching; stiff muscles; trouble walking; sweating; high fever; seizures; chills; vomiting Side effects that usually do not require medical attention (report to your doctor or health care professional if they continue or are bothersome):  diarrhea  dizziness  drowsiness  dry mouth  headache  nausea, vomiting  pain, tingling, numbness in the hands or feet  stomach pain This list may not describe all possible side effects. Call your doctor for medical advice about side effects. You may report side effects to FDA at 1-800-FDA-1088. Where should I keep my medicine? Keep out of the reach of children. Store at room temperature between 15 and 30 degrees C (59 and 86 degrees F). Protect from light and moisture. Throw away any unused medicine after the expiration date. NOTE: This sheet is a summary. It may not cover all possible information. If you have questions about this medicine, talk to your doctor, pharmacist, or health care  provider.  2020 Elsevier/Gold Standard (2017-12-20 14:58:08) Galcanezumab injection What is this medicine? GALCANEZUMAB (gal ka NEZ ue mab) is used to prevent migraines and treat cluster headaches. This medicine may be used for other purposes; ask your health care provider or pharmacist if you have questions. COMMON BRAND NAME(S): Emgality What should I tell my health care provider before I take this medicine? They need to know if you have any of these conditions:  an unusual or allergic reaction to galcanezumab, other medicines, foods, dyes, or preservatives  pregnant or trying to get pregnant  breast-feeding How should I use this medicine? This medicine is for injection under the skin. You will be taught how to prepare and give this medicine. Use exactly as directed. Take your medicine at regular intervals. Do not take your medicine more often than directed. It is important that you put your used needles and syringes in a special sharps container. Do not put them in a trash can. If you do not have a sharps container, call your pharmacist or healthcare provider to get one. Talk to your pediatrician regarding the use of this medicine in children. Special care may be needed. Overdosage: If you think you have taken too much of this medicine contact a poison control center or emergency room at once. NOTE: This medicine is only for you. Do not share this medicine with others. What if I miss a  dose? If you miss a dose, take it as soon as you can. If it is almost time for your next dose, take only that dose. Do not take double or extra doses. What may interact with this medicine? Interactions are not expected. This list may not describe all possible interactions. Give your health care provider a list of all the medicines, herbs, non-prescription drugs, or dietary supplements you use. Also tell them if you smoke, drink alcohol, or use illegal drugs. Some items may interact with your  medicine. What should I watch for while using this medicine? Tell your doctor or healthcare professional if your symptoms do not start to get better or if they get worse. What side effects may I notice from receiving this medicine? Side effects that you should report to your doctor or health care professional as soon as possible:  allergic reactions like skin rash, itching or hives, swelling of the face, lips, or tongue Side effects that usually do not require medical attention (report these to your doctor or health care professional if they continue or are bothersome):  pain, redness, or irritation at site where injected This list may not describe all possible side effects. Call your doctor for medical advice about side effects. You may report side effects to FDA at 1-800-FDA-1088. Where should I keep my medicine? Keep out of the reach of children. You will be instructed on how to store this medicine. Throw away any unused medicine after the expiration date on the label. NOTE: This sheet is a summary. It may not cover all possible information. If you have questions about this medicine, talk to your doctor, pharmacist, or health care provider.  2020 Elsevier/Gold Standard (2017-11-23 12:03:23)

## 2020-02-11 NOTE — Progress Notes (Signed)
[facility]    Provider:  Dr Jaynee Eagles Requesting Provider: Cletis Athens, MD Primary Care Provider:  Cletis Athens, MD  CC:  Migraine  HPI:  Melissa [name] is a 49 y.o. female here as requested by Cletis Athens, MD for migraines. PMHx POTS, panic attack, migraine, fibromyalgia, bipolar, arthritis, HTN, IBS, depression, B12 deficiency, tremor, anxiety. She is having stress with her mother who is 25 and refuses to leave her home, so there is stress affecting. Been doing fine, restarted 3-4 months ago in the setting of stress, unilateral, mostly sound sensitivity and mild light sensitivity, movement makes it worse, nausea, no vomiting, getting a migraine every week now, can last 2-3 days, up to 12 migraine days a month for the last 3 months and pulsating/pounding, starts in the left in the back and radiates to the front, can be severe, a quiet room helps, OTC has not helped, hydrocodone has not helped significantly, had to go to the walk-in clinic recently, can affect her life and work. These are similar to prior migraines, vision is affected with the severe migraines, no new sensory changes or new vision changes, same quality and severity just more frequent, they can happen any time of the day. Not positional or exertional or anything of concern ut is the same exact migraine as always. No other focal neurologic deficits, associated symptoms, inciting events or modifiable factors.  Reviewed notes, labs and imaging from outside physicians, which showed:  Cbc/bmp unremarkable 06/26/2019  CT head 08/2018 showed No acute intracranial abnormalities including mass lesion or mass effect, hydrocephalus, extra-axial fluid collection, midline shift, hemorrhage, or acute infarction, large ischemic events (personally reviewed images)  Medications that patient has use that can be used in migraine management include: Tylenol, Elavil, Wellbutrin, Tegretol, Flexeril, diclofenac tablet, Benadryl,  Depakote, Prozac, gabapentin, Toradol injections, meclizine, Robaxin, Reglan injections, Zofran injections and tablets, prednisone tablets, Phenergan tablets and suppositories, Maxalt, Imitrex tablets and injections, tizanidine, tramadol, Effexor, verapamil, topamax, propranolol contraindicated due to POTS.    Review of Systems: Patient complains of symptoms per HPI as well as the following symptoms headaches and migraines. Pertinent negatives and positives per HPI. All others negative.   Social History   Socioeconomic History  . Marital status: Married    Spouse name: Melissa [name]  . Number of children: 0  . Years of education: some college  . Highest education level: Not on file  Occupational History    Employer: OTHER    Comment: disabled  Tobacco Use  . Smoking status: Never Smoker  . Smokeless tobacco: Never Used  Vaping Use  . Vaping Use: Never used  Substance and Sexual Activity  . Alcohol use: No    Alcohol/week: 0.0 standard drinks  . Drug use: No  . Sexual activity: Yes    Birth control/protection: Surgical    Comment: also on OCPs  Other Topics Concern  . Not on file  Social History Narrative   08/28/19   From: [place], moved because her husband was from [facility]   Living: with husband [place], since 1990   Work: disability due to fibromyalgia and bipolar disorder      Family: mother in law is nearby, her mother is in [place]; has a sister      Enjoys: crochet, baking, reading      Exercise: walking the dog, yoga - tries to do 3 times a week   Diet: not great, more sweets than she should      Safety   Seat belts:  Yes    Guns: Yes  and secure   Safe in relationships: Yes          Right handed   Caffeine: none    Social Determinants of Health   Financial Resource Strain:   . Difficulty of Paying Living Expenses: Not on file  Food Insecurity:   . Worried About Charity fundraiser in the Last Year: Not on file  . Ran Out of Food in the Last Year: Not on file   Transportation Needs:   . Lack of Transportation (Medical): Not on file  . Lack of Transportation (Non-Medical): Not on file  Physical Activity:   . Days of Exercise per Week: Not on file  . Minutes of Exercise per Session: Not on file  Stress:   . Feeling of Stress : Not on file  Social Connections:   . Frequency of Communication with Friends and Family: Not on file  . Frequency of Social Gatherings with Friends and Family: Not on file  . Attends Religious Services: Not on file  . Active Member of Clubs or Organizations: Not on file  . Attends Archivist Meetings: Not on file  . Marital Status: Not on file  Intimate Partner Violence:   . Fear of Current or Ex-Partner: Not on file  . Emotionally Abused: Not on file  . Physically Abused: Not on file  . Sexually Abused: Not on file    Family History  Problem Relation Age of Onset  . Diabetes Mother   . Hypertension Mother   . Brain cancer Father   . Ovarian cancer Maternal Grandmother        metastasized  . Lung cancer Maternal Grandfather   . Cancer Paternal Grandmother        not sure of the type  . Cancer Paternal Grandfather        not sure of the type  . Migraines Neg Hx     Past Medical History:  Diagnosis Date  . Arthritis   . Bipolar disorder ([place])   . Fibromyalgia    on disability  . Migraine   . Muscle spasm   . Pain of right side of body    chronic  . Panic attack   . POTS (postural orthostatic tachycardia syndrome)   . Syncope    recurrent  . Uterine perforation    hx of    Patient Active Problem List   Diagnosis Date Noted  . Chronic migraine without aura without status migrainosus, not intractable 02/11/2020  . Anxiety 10/23/2019  . Dizziness 07/02/2019  . Tremor of both hands 03/27/2018  . Syncopal episodes 09/10/2017  . HTN (hypertension) 05/09/2017  . Menorrhagia 11/11/2016  . Alopecia 03/02/2016  . Bilateral cataracts 08/14/2015  . Acne 10/21/2014  . Liver lesion  09/09/2014  . Vitamin D deficiency 09/09/2014  . Hot flashes 06/10/2014  . Chronic pain syndrome 05/20/2014  . Vitamin B12 deficiency 03/12/2014  . Contraception management 09/20/2013  . IBS (irritable bowel syndrome) 09/20/2013  . Unspecified vitamin D deficiency 04/03/2013  . Dyslipidemia 04/03/2013  . Osteopenia 04/03/2013  . POTS (postural orthostatic tachycardia syndrome) 02/26/2013  . Migraine 11/29/2012  . Allergic rhinitis 10/05/2012  . Mixed bipolar I disorder in remission ([place]) 10/05/2012  . Combined fat and carbohydrate induced hyperlipemia 10/05/2012  . Fibromyalgia   . Bipolar disorder ([place])   . Fatigue 09/10/2011  . Clinical depression 09/10/2011  . Acid reflux 09/10/2011  . Non-restorative sleep 09/10/2011  . Disturbance  in sleep behavior 09/10/2011    Past Surgical History:  Procedure Laterality Date  . CATARACT EXTRACTION  dec 2014  . DILATION AND CURETTAGE OF UTERUS    . LASIK  dec 2014    Current Outpatient Medications  Medication Sig Dispense Refill  . acetaminophen (TYLENOL) 500 MG tablet Take 1,000 mg by mouth daily as needed for mild pain.    Marland Kitchen buPROPion (WELLBUTRIN XL) 150 MG 24 hr tablet Take 1 tablet (150 mg total) by mouth daily. 90 tablet 3  . cholecalciferol (VITAMIN D) 1000 units tablet Take 5,000 Units by mouth daily.    . clonazePAM (KLONOPIN) 1 MG tablet Take 1 tablet (1 mg total) by mouth 2 (two) times daily as needed. for anxiety 60 tablet 0  . diclofenac sodium (VOLTAREN) 1 % GEL Apply to affected area three to four times daily. 100 g 0  . fluvoxaMINE (LUVOX) 50 MG tablet Take 50-100 mg by mouth at bedtime.     . gabapentin (NEURONTIN) 600 MG tablet Take 1 tablet (600 mg total) by mouth 3 (three) times daily. 270 tablet 3  . HYDROcodone-acetaminophen (NORCO/VICODIN) 5-325 MG tablet Take 1 tablet by mouth every 6 (six) hours as needed for moderate pain. 20 tablet 0  . norgestimate-ethinyl estradiol (SPRINTEC 28) 0.25-35 MG-MCG tablet Take 1  tablet by mouth daily. 6 Package 1  . ondansetron (ZOFRAN-ODT) 4 MG disintegrating tablet Take 1 tablet (4 mg total) by mouth every 8 (eight) hours as needed for nausea or vomiting. 20 tablet 11  . verapamil (CALAN-SR) 120 MG CR tablet Take 1 tablet (120 mg total) by mouth at bedtime. 90 tablet 3  . vitamin B-12 (CYANOCOBALAMIN) 1000 MCG tablet Take 1,000 mcg by mouth daily.    . Galcanezumab-gnlm (EMGALITY) 120 MG/ML SOAJ Inject 120 mg into the skin every 30 (thirty) days. 1 mL 11  . methocarbamol (ROBAXIN) 500 MG tablet Take 1 tablet (500 mg total) by mouth 4 (four) times daily. 60 tablet 1  . rizatriptan (MAXALT-MLT) 10 MG disintegrating tablet Take 1 tablet (10 mg total) by mouth as needed for migraine. May repeat in 2 hours if needed 9 tablet 11   No current facility-administered medications for this visit.    Allergies as of 02/11/2020 - Review Complete 02/11/2020  Allergen Reaction Noted  . Lamotrigine Hives and Swelling 06/03/2015  . Abilify [aripiprazole] Other (See Comments) 07/17/2015  . Azithromycin Rash 09/12/2012  . Depakote [divalproex sodium] Other (See Comments) 11/28/2012  . Erythromycin base Swelling 01/03/2015  . Lithium Rash 11/28/2012  . Penicillins Rash 09/12/2012  . Pregabalin Palpitations 11/23/2013  . Seroquel [quetiapine fumarate] Other (See Comments) 11/28/2012  . Sulfa antibiotics Rash 09/12/2012  . Tetanus toxoids Other (See Comments) 06/03/2015  . Tramadol Nausea And Vomiting 03/12/2015  . Ziprasidone hcl Other (See Comments) 01/03/2015    Vitals: BP 125/78 (BP Location: Right Arm, Patient Position: Sitting)   Pulse 95   Ht 5\' 9"  (1.753 m)   Wt 160 lb (72.6 kg)   BMI 23.63 kg/m  Last Weight:  Wt Readings from Last 1 Encounters:  02/11/20 160 lb (72.6 kg)   Last Height:   Ht Readings from Last 1 Encounters:  02/11/20 5\' 9"  (1.753 m)     Physical exam: Exam: Gen: NAD, conversant, well nourised, well groomed                     CV: RRR, no  MRG. No Carotid Bruits. No peripheral edema, warm, nontender  Eyes: Conjunctivae clear without exudates or hemorrhage  Neuro: Detailed Neurologic Exam  Speech:    Speech is normal; fluent and spontaneous with normal comprehension.  Cognition:    The patient is oriented to person, place, and time;     recent and remote memory intact;     language fluent;     normal attention, concentration,     fund of knowledge Cranial Nerves:    The pupils are equal, round, and reactive to light. The fundi are flat. Visual fields are full to finger confrontation. Extraocular movements are intact. Trigeminal sensation is intact and the muscles of mastication are normal. The face is symmetric. The palate elevates in the midline. Hearing intact. Voice is normal. Shoulder shrug is normal. The tongue has normal motion without fasciculations.   Coordination:    No dysmetria or ataxia.   Gait:  normal native gait  Motor Observation:    No asymmetry, no atrophy, and no involuntary movements noted. Tone:    Normal muscle tone.    Posture:    Posture is normal. normal erect    Strength:    Strength is V/V in the upper and lower limbs.      Sensation: intact to LT     Reflex Exam:  DTR's:    Deep tendon reflexes in the upper and lower extremities are normal bilaterally.   Toes:    The toes are downgoing bilaterally.   Clonus:    Clonus is absent.    Assessment/Plan:  49 year old with chronic migraines recently worsening in the setting of stress. She has tried and failed multiple medications, we discussed options and she will start Emgality and we can see if we can get it approved.  Start Emgality monthly, gave loading dose in the office Rizatriptan: Please take one tablet at the onset of your headache. If it does not improve the symptoms please take one additional tablet. Do not take more then 2 tablets in 24hrs. Do not take use more then 2 to 3 times in a week.  Discussed: To prevent or  relieve headaches, try the following: Cool Compress. Lie down and place a cool compress on your head.  Avoid headache triggers. If certain foods or odors seem to have triggered your migraines in the past, avoid them. A headache diary might help you identify triggers.  Include physical activity in your daily routine. Try a daily walk or other moderate aerobic exercise.  Manage stress. Find healthy ways to cope with the stressors, such as delegating tasks on your to-do list.  Practice relaxation techniques. Try deep breathing, yoga, massage and visualization.  Eat regularly. Eating regularly scheduled meals and maintaining a healthy diet might help prevent headaches. Also, drink plenty of fluids.  Follow a regular sleep schedule. Sleep deprivation might contribute to headaches Consider biofeedback. With this mind-body technique, you learn to control certain bodily functions -- such as muscle tension, heart rate and blood pressure -- to prevent headaches or reduce headache pain.    Proceed to emergency room if you experience new or worsening symptoms or symptoms do not resolve, if you have new neurologic symptoms or if headache is severe, or for any concerning symptom.   Provided education and documentation from American headache Society toolbox including articles on: chronic migraine medication overuse headache, chronic migraines, prevention of migraines, behavioral and other nonpharmacologic treatments for headache.  Meds ordered this encounter  Medications  . Galcanezumab-gnlm (EMGALITY) 120 MG/ML SOAJ    Sig: Inject 120  mg into the skin every 30 (thirty) days.    Dispense:  1 mL    Refill:  11  . rizatriptan (MAXALT-MLT) 10 MG disintegrating tablet    Sig: Take 1 tablet (10 mg total) by mouth as needed for migraine. May repeat in 2 hours if needed    Dispense:  9 tablet    Refill:  11    Cc: Cletis Athens, MD,  Cletis Athens, MD  Sarina Ill, MD  [facility] [address] [place], [place] [address]  Phone [phone] Fax [phone]

## 2020-02-14 ENCOUNTER — Encounter: Payer: Self-pay | Admitting: *Deleted

## 2020-02-14 ENCOUNTER — Telehealth: Payer: Self-pay | Admitting: *Deleted

## 2020-02-14 NOTE — Telephone Encounter (Signed)
Received approval letter from [facility]. I faxed the letter to pt's pharmacy. Received a receipt of confirmation.

## 2020-02-14 NOTE — Telephone Encounter (Addendum)
Emmgality Approved today PA Case: [id number], Status: Approved, Coverage Starts on: 02/14/2020 12:00:00 AM, Coverage Ends on: 05/14/2020 12:00:00 AM. Questions? Contact [phone]. Sent her my chart to advise of approval.

## 2020-02-14 NOTE — Telephone Encounter (Signed)
Emgality PA, key: [id number], [id number].  Medications that patient has used that can be used in migraine management include: Tylenol, Elavil, Wellbutrin, Tegretol, Flexeril, diclofenac tablet, Benadryl, Depakote, Prozac, gabapentin, Toradol injections, meclizine, Robaxin, Reglan injections, Zofran injections and tablets, prednisone tablets, Phenergan tablets and suppositories, Maxalt, Imitrex tablets and injections, tizanidine, tramadol, Effexor, verapamil, topamax, propranolol contraindicated due to POTS. Your information has been sent to [facility].

## 2020-02-28 DIAGNOSIS — F3175 Bipolar disorder, in partial remission, most recent episode depressed: Secondary | ICD-10-CM | POA: Diagnosis not present

## 2020-02-28 DIAGNOSIS — F411 Generalized anxiety disorder: Secondary | ICD-10-CM | POA: Diagnosis not present

## 2020-02-29 ENCOUNTER — Encounter: Payer: Medicare HMO | Admitting: Internal Medicine

## 2020-03-03 ENCOUNTER — Inpatient Hospital Stay: Payer: Medicare HMO

## 2020-03-03 ENCOUNTER — Inpatient Hospital Stay
Admission: EM | Admit: 2020-03-03 | Discharge: 2020-03-12 | DRG: 177 | Disposition: A | Discharge status: Home / Self Care | Payer: Medicare HMO | Attending: Hospitalist | Admitting: Hospitalist

## 2020-03-03 ENCOUNTER — Other Ambulatory Visit: Payer: Self-pay

## 2020-03-03 ENCOUNTER — Ambulatory Visit
Admission: EM | Admit: 2020-03-03 | Discharge: 2020-03-03 | Disposition: A | Discharge status: Home / Self Care | Payer: Medicare HMO

## 2020-03-03 ENCOUNTER — Emergency Department: Payer: Medicare HMO

## 2020-03-03 DIAGNOSIS — F410 Panic disorder [episodic paroxysmal anxiety] without agoraphobia: Secondary | ICD-10-CM | POA: Diagnosis present

## 2020-03-03 DIAGNOSIS — R0600 Dyspnea, unspecified: Secondary | ICD-10-CM | POA: Diagnosis not present

## 2020-03-03 DIAGNOSIS — K589 Irritable bowel syndrome without diarrhea: Secondary | ICD-10-CM | POA: Diagnosis not present

## 2020-03-03 DIAGNOSIS — R630 Anorexia: Secondary | ICD-10-CM | POA: Diagnosis present

## 2020-03-03 DIAGNOSIS — A419 Sepsis, unspecified organism: Secondary | ICD-10-CM

## 2020-03-03 DIAGNOSIS — Z881 Allergy status to other antibiotic agents status: Secondary | ICD-10-CM | POA: Diagnosis not present

## 2020-03-03 DIAGNOSIS — F418 Other specified anxiety disorders: Secondary | ICD-10-CM | POA: Diagnosis not present

## 2020-03-03 DIAGNOSIS — Z880 Allergy status to penicillin: Secondary | ICD-10-CM | POA: Diagnosis not present

## 2020-03-03 DIAGNOSIS — Z833 Family history of diabetes mellitus: Secondary | ICD-10-CM

## 2020-03-03 DIAGNOSIS — Z8249 Family history of ischemic heart disease and other diseases of the circulatory system: Secondary | ICD-10-CM | POA: Diagnosis not present

## 2020-03-03 DIAGNOSIS — R739 Hyperglycemia, unspecified: Secondary | ICD-10-CM | POA: Diagnosis not present

## 2020-03-03 DIAGNOSIS — Z801 Family history of malignant neoplasm of trachea, bronchus and lung: Secondary | ICD-10-CM | POA: Diagnosis not present

## 2020-03-03 DIAGNOSIS — M1990 Unspecified osteoarthritis, unspecified site: Secondary | ICD-10-CM | POA: Diagnosis present

## 2020-03-03 DIAGNOSIS — J189 Pneumonia, unspecified organism: Secondary | ICD-10-CM | POA: Diagnosis not present

## 2020-03-03 DIAGNOSIS — Z79899 Other long term (current) drug therapy: Secondary | ICD-10-CM | POA: Diagnosis not present

## 2020-03-03 DIAGNOSIS — Z808 Family history of malignant neoplasm of other organs or systems: Secondary | ICD-10-CM

## 2020-03-03 DIAGNOSIS — R590 Localized enlarged lymph nodes: Secondary | ICD-10-CM | POA: Diagnosis not present

## 2020-03-03 DIAGNOSIS — R05 Cough: Secondary | ICD-10-CM | POA: Diagnosis not present

## 2020-03-03 DIAGNOSIS — Z8041 Family history of malignant neoplasm of ovary: Secondary | ICD-10-CM | POA: Diagnosis not present

## 2020-03-03 DIAGNOSIS — M797 Fibromyalgia: Secondary | ICD-10-CM | POA: Diagnosis present

## 2020-03-03 DIAGNOSIS — T380X5A Adverse effect of glucocorticoids and synthetic analogues, initial encounter: Secondary | ICD-10-CM | POA: Diagnosis not present

## 2020-03-03 DIAGNOSIS — R7401 Elevation of levels of liver transaminase levels: Secondary | ICD-10-CM

## 2020-03-03 DIAGNOSIS — R791 Abnormal coagulation profile: Secondary | ICD-10-CM | POA: Diagnosis not present

## 2020-03-03 DIAGNOSIS — D75839 Thrombocytosis, unspecified: Secondary | ICD-10-CM | POA: Diagnosis not present

## 2020-03-03 DIAGNOSIS — Z888 Allergy status to other drugs, medicaments and biological substances status: Secondary | ICD-10-CM

## 2020-03-03 DIAGNOSIS — I10 Essential (primary) hypertension: Secondary | ICD-10-CM | POA: Diagnosis not present

## 2020-03-03 DIAGNOSIS — J9811 Atelectasis: Secondary | ICD-10-CM | POA: Diagnosis not present

## 2020-03-03 DIAGNOSIS — J9601 Acute respiratory failure with hypoxia: Secondary | ICD-10-CM | POA: Diagnosis not present

## 2020-03-03 DIAGNOSIS — K219 Gastro-esophageal reflux disease without esophagitis: Secondary | ICD-10-CM

## 2020-03-03 DIAGNOSIS — Z882 Allergy status to sulfonamides status: Secondary | ICD-10-CM | POA: Diagnosis not present

## 2020-03-03 DIAGNOSIS — F319 Bipolar disorder, unspecified: Secondary | ICD-10-CM | POA: Diagnosis not present

## 2020-03-03 DIAGNOSIS — J1282 Pneumonia due to coronavirus disease 2019: Secondary | ICD-10-CM

## 2020-03-03 DIAGNOSIS — D473 Essential (hemorrhagic) thrombocythemia: Secondary | ICD-10-CM | POA: Diagnosis not present

## 2020-03-03 DIAGNOSIS — G43909 Migraine, unspecified, not intractable, without status migrainosus: Secondary | ICD-10-CM | POA: Diagnosis present

## 2020-03-03 DIAGNOSIS — R0602 Shortness of breath: Secondary | ICD-10-CM | POA: Diagnosis not present

## 2020-03-03 DIAGNOSIS — U071 COVID-19: Secondary | ICD-10-CM | POA: Diagnosis not present

## 2020-03-03 DIAGNOSIS — R000 Tachycardia, unspecified: Secondary | ICD-10-CM | POA: Diagnosis not present

## 2020-03-03 DIAGNOSIS — R7989 Other specified abnormal findings of blood chemistry: Secondary | ICD-10-CM

## 2020-03-03 LAB — FERRITIN: Ferritin: 2561 ng/mL — ABNORMAL HIGH (ref 11–307)

## 2020-03-03 LAB — BASIC METABOLIC PANEL
Anion gap: 12 (ref 5–15)
BUN: 12 mg/dL (ref 6–20)
CO2: 28 mmol/L (ref 22–32)
Calcium: 9.1 mg/dL (ref 8.9–10.3)
Chloride: 97 mmol/L — ABNORMAL LOW (ref 98–111)
Creatinine, Ser: 0.99 mg/dL (ref 0.44–1.00)
GFR calc Af Amer: >60 mL/min (ref >60)
GFR calc non Af Amer: >60 mL/min (ref >60)
Glucose, Bld: 121 mg/dL — ABNORMAL HIGH (ref 70–99)
Potassium: 3.9 mmol/L (ref 3.5–5.1)
Sodium: 137 mmol/L (ref 135–145)

## 2020-03-03 LAB — SARS CORONAVIRUS 2 BY RT PCR (HOSPITAL ORDER, PERFORMED IN ~~LOC~~ HOSPITAL LAB): SARS Coronavirus 2: POSITIVE — AB

## 2020-03-03 LAB — CBC
HCT: 40.8 % (ref 36.0–46.0)
Hemoglobin: 13.7 g/dL (ref 12.0–15.0)
MCH: 28.5 pg (ref 26.0–34.0)
MCHC: 33.6 g/dL (ref 30.0–36.0)
MCV: 85 fL (ref 80.0–100.0)
Platelets: 230 10*3/uL (ref 150–400)
RBC: 4.8 MIL/uL (ref 3.87–5.11)
RDW: 13.3 % (ref 11.5–15.5)
WBC: 7.9 10*3/uL (ref 4.0–10.5)
nRBC: 0 % (ref 0.0–0.2)

## 2020-03-03 LAB — HEPATIC FUNCTION PANEL
ALT: 51 U/L — ABNORMAL HIGH (ref 0–44)
AST: 104 U/L — ABNORMAL HIGH (ref 15–41)
Albumin: 2.9 g/dL — ABNORMAL LOW (ref 3.5–5.0)
Alkaline Phosphatase: 74 U/L (ref 38–126)
Bilirubin, Direct: 0.6 mg/dL — ABNORMAL HIGH (ref 0.0–0.2)
Indirect Bilirubin: 0.7 mg/dL (ref 0.3–0.9)
Total Bilirubin: 1.3 mg/dL — ABNORMAL HIGH (ref 0.3–1.2)
Total Protein: 6.8 g/dL (ref 6.5–8.1)

## 2020-03-03 LAB — TROPONIN I (HIGH SENSITIVITY): Troponin I (High Sensitivity): 11 ng/L (ref <18)

## 2020-03-03 LAB — MAGNESIUM: Magnesium: 1.9 mg/dL (ref 1.7–2.4)

## 2020-03-03 LAB — FIBRINOGEN: Fibrinogen: 706 mg/dL — ABNORMAL HIGH (ref 210–475)

## 2020-03-03 LAB — FIBRIN DERIVATIVES D-DIMER (ARMC ONLY): Fibrin derivatives D-dimer (ARMC): 933.83 ng/mL (FEU) — ABNORMAL HIGH (ref 0.00–499.00)

## 2020-03-03 LAB — LACTATE DEHYDROGENASE: LDH: 492 U/L — ABNORMAL HIGH (ref 98–192)

## 2020-03-03 LAB — PROCALCITONIN: Procalcitonin: 0.52 ng/mL

## 2020-03-03 LAB — LACTIC ACID, PLASMA: Lactic Acid, Venous: 1.5 mmol/L (ref 0.5–1.9)

## 2020-03-03 LAB — TRIGLYCERIDES: Triglycerides: 330 mg/dL — ABNORMAL HIGH (ref <150)

## 2020-03-03 LAB — BRAIN NATRIURETIC PEPTIDE: B Natriuretic Peptide: 39.4 pg/mL (ref 0.0–100.0)

## 2020-03-03 MED ORDER — GABAPENTIN 600 MG PO TABS
600.0000 mg | ORAL_TABLET | Freq: Three times a day (TID) | ORAL | Status: DC
  Administered 2020-03-04 – 2020-03-12 (×23): 600 mg via ORAL
  Filled 2020-03-03 (×22): qty 1

## 2020-03-03 MED ORDER — METHYLPREDNISOLONE SODIUM SUCC 40 MG IJ SOLR
40.0000 mg | Freq: Two times a day (BID) | INTRAMUSCULAR | Status: DC
  Administered 2020-03-03 – 2020-03-07 (×8): 40 mg via INTRAVENOUS
  Filled 2020-03-03 (×8): qty 1

## 2020-03-03 MED ORDER — VITAMIN D 25 MCG (1000 UNIT) PO TABS
5000.0000 [IU] | ORAL_TABLET | Freq: Every day | ORAL | Status: DC
  Administered 2020-03-03 – 2020-03-12 (×10): 5000 [IU] via ORAL
  Filled 2020-03-03 (×10): qty 5

## 2020-03-03 MED ORDER — ONDANSETRON HCL 4 MG/2ML IJ SOLN
4.0000 mg | Freq: Three times a day (TID) | INTRAMUSCULAR | Status: DC | PRN
  Administered 2020-03-03: 4 mg via INTRAVENOUS
  Filled 2020-03-03: qty 2

## 2020-03-03 MED ORDER — ALBUTEROL SULFATE HFA 108 (90 BASE) MCG/ACT IN AERS
2.0000 | INHALATION_SPRAY | RESPIRATORY_TRACT | Status: DC | PRN
  Filled 2020-03-03: qty 6.7

## 2020-03-03 MED ORDER — ACETAMINOPHEN 500 MG PO TABS
1000.0000 mg | ORAL_TABLET | Freq: Once | ORAL | Status: AC
  Administered 2020-03-03: 1000 mg via ORAL
  Filled 2020-03-03: qty 2

## 2020-03-03 MED ORDER — FLUVOXAMINE MALEATE 50 MG PO TABS
50.0000 mg | ORAL_TABLET | Freq: Every day | ORAL | Status: DC
  Filled 2020-03-03: qty 2

## 2020-03-03 MED ORDER — FLUVOXAMINE MALEATE 50 MG PO TABS
50.0000 mg | ORAL_TABLET | Freq: Every day | ORAL | Status: DC
  Administered 2020-03-04 – 2020-03-11 (×8): 50 mg via ORAL
  Filled 2020-03-03 (×10): qty 1

## 2020-03-03 MED ORDER — FAMOTIDINE 20 MG PO TABS: 20.0000 mg | ORAL_TABLET | Freq: Every day | ORAL | Status: DC | PRN

## 2020-03-03 MED ORDER — CYCLOBENZAPRINE HCL 10 MG PO TABS: 10.0000 mg | ORAL_TABLET | Freq: Three times a day (TID) | ORAL | Status: DC | PRN

## 2020-03-03 MED ORDER — NORGESTIMATE-ETH ESTRADIOL 0.25-35 MG-MCG PO TABS: 1.0000 | ORAL_TABLET | Freq: Every day | ORAL | Status: DC

## 2020-03-03 MED ORDER — ENOXAPARIN SODIUM 40 MG/0.4ML ~~LOC~~ SOLN
40.0000 mg | SUBCUTANEOUS | Status: DC
  Administered 2020-03-03 – 2020-03-11 (×9): 40 mg via SUBCUTANEOUS
  Filled 2020-03-03 (×9): qty 0.4

## 2020-03-03 MED ORDER — SODIUM CHLORIDE 0.9 % IV SOLN
100.0000 mg | Freq: Every day | INTRAVENOUS | Status: AC
  Administered 2020-03-04 – 2020-03-07 (×4): 100 mg via INTRAVENOUS
  Filled 2020-03-03 (×4): qty 20

## 2020-03-03 MED ORDER — ACETAMINOPHEN 325 MG PO TABS
650.0000 mg | ORAL_TABLET | Freq: Four times a day (QID) | ORAL | Status: DC | PRN
  Administered 2020-03-08: 10:00:00 650 mg via ORAL
  Filled 2020-03-03: qty 2

## 2020-03-03 MED ORDER — VITAMIN B-12 1000 MCG PO TABS
1000.0000 ug | ORAL_TABLET | Freq: Every day | ORAL | Status: DC
  Administered 2020-03-04 – 2020-03-12 (×9): 1000 ug via ORAL
  Filled 2020-03-03 (×11): qty 1

## 2020-03-03 MED ORDER — LOPERAMIDE HCL 2 MG PO CAPS: 2.0000 mg | ORAL_CAPSULE | ORAL | Status: DC | PRN

## 2020-03-03 MED ORDER — ZINC SULFATE 220 (50 ZN) MG PO CAPS
220.0000 mg | ORAL_CAPSULE | Freq: Every day | ORAL | Status: DC
  Administered 2020-03-03 – 2020-03-12 (×10): 220 mg via ORAL
  Filled 2020-03-03 (×9): qty 1

## 2020-03-03 MED ORDER — DEXAMETHASONE SODIUM PHOSPHATE 10 MG/ML IJ SOLN: 6.0000 mg | INTRAMUSCULAR | Status: DC

## 2020-03-03 MED ORDER — CLONAZEPAM 1 MG PO TABS
1.0000 mg | ORAL_TABLET | Freq: Two times a day (BID) | ORAL | Status: DC | PRN
  Administered 2020-03-04: 21:00:00 1 mg via ORAL
  Filled 2020-03-03 (×2): qty 1

## 2020-03-03 MED ORDER — ASCORBIC ACID 500 MG PO TABS
500.0000 mg | ORAL_TABLET | Freq: Every day | ORAL | Status: DC
  Administered 2020-03-03 – 2020-03-12 (×10): 500 mg via ORAL
  Filled 2020-03-03 (×9): qty 1

## 2020-03-03 MED ORDER — IOHEXOL 350 MG/ML SOLN
75.0000 mL | Freq: Once | INTRAVENOUS | Status: AC | PRN
  Administered 2020-03-03: 75 mL via INTRAVENOUS
  Filled 2020-03-03: qty 75

## 2020-03-03 MED ORDER — DM-GUAIFENESIN ER 30-600 MG PO TB12: 1.0000 | ORAL_TABLET | Freq: Two times a day (BID) | ORAL | Status: DC | PRN

## 2020-03-03 MED ORDER — BUPROPION HCL ER (XL) 150 MG PO TB24
150.0000 mg | ORAL_TABLET | Freq: Every day | ORAL | Status: DC
  Administered 2020-03-04 – 2020-03-12 (×9): 150 mg via ORAL
  Filled 2020-03-03 (×8): qty 1

## 2020-03-03 MED ORDER — SODIUM CHLORIDE 0.9 % IV SOLN
200.0000 mg | Freq: Once | INTRAVENOUS | Status: AC
  Administered 2020-03-03: 200 mg via INTRAVENOUS
  Filled 2020-03-03: qty 200

## 2020-03-03 MED ORDER — SUMATRIPTAN SUCCINATE 50 MG PO TABS
50.0000 mg | ORAL_TABLET | ORAL | Status: DC | PRN
  Filled 2020-03-03: qty 1

## 2020-03-03 MED ORDER — IPRATROPIUM BROMIDE HFA 17 MCG/ACT IN AERS
2.0000 | INHALATION_SPRAY | RESPIRATORY_TRACT | Status: DC
  Administered 2020-03-04 – 2020-03-08 (×28): 2 via RESPIRATORY_TRACT
  Filled 2020-03-03: qty 12.9

## 2020-03-03 NOTE — Consult Note (Signed)
Remdesivir - Pharmacy Brief Note   O:  ALT: 51 CXR: Focal airspace opacity felt to represent focus of pneumonia left base. Subtle atelectasis right base and left mid lung regions in right base. Question early pneumonia in these areas as well. This appearance warrants check of COVID-19 status. SpO2: 92% on 4L [place]   A/P:  Remdesivir 200 mg IVPB once followed by 100 mg IVPB daily x 4 days.   Benn Moulder, PharmD Pharmacy Resident  03/03/2020 2:33 PM

## 2020-03-03 NOTE — ED Triage Notes (Signed)
Patient c/o cough and ShOB since last Wednesday. Also reports generalized body aches.

## 2020-03-03 NOTE — ED Notes (Signed)
Pt has had SHOB since last Wednesday/thursday.  Got worse yesterday.  When went to clinic was hypoxic so sent to hospital.  Increased pt to 4 L [place].  No fever at home but was taking tylenol.  Spiked temp while here.

## 2020-03-03 NOTE — ED Notes (Signed)
Unsuccessful blood draw/IV start

## 2020-03-03 NOTE — ED Triage Notes (Signed)
[facility] sent [facility] with c/o SOB that has worsened over the weekend, states it started with sinus drainage last Wednesday.

## 2020-03-03 NOTE — ED Notes (Signed)
Assumed care of patient patient aox4, c/o of weakness and increased sob with exertion. Patient ambulated a short distance to bathroom and desated down to 87. Awaiting bed status will continue to monitor.

## 2020-03-03 NOTE — ED Provider Notes (Signed)
[facility] Emergency Department Provider Note  ____________________________________________   First MD Initiated Contact with Patient 03/03/20 1302     (approximate)  I have reviewed the triage vital signs and the nursing notes.   HISTORY  Chief Complaint Shortness of Breath and URI   HPI Melissa [name] is a 49 y.o. female with a past medical history arthritis, bipolar disorder, fibromyalgia, migraines, and pots disease who presents for assessment from clinic for approximately 1 week of worsening cough, shortness of breath, myalgias, fevers, chills, and some nonbloody diarrhea yesterday.  Patient endorses some lightheadedness, severe fatigue, poor p.o. intake, and some intermittent headaches.  She denies any earache, hemoptysis, chest pain, dental pain, back pain, rash, focal extremity pain, burning with urination, increased urinary frequency, or any blood in her urine.  No prior similar episodes.  No alleviating or aggravating factors.  Patient notes she has not vaccinate against Covid.  She denies any tobacco abuse or EtOH use or illicit drug use.  Patient was initially seen in urgent care clinic earlier this morning but referred to the ED for new oxygen requirement.         Past Medical History:  Diagnosis Date  . Arthritis   . Bipolar disorder ([place])   . Fibromyalgia    on disability  . Migraine   . Muscle spasm   . Pain of right side of body    chronic  . Panic attack   . POTS (postural orthostatic tachycardia syndrome)   . Syncope    recurrent  . Uterine perforation    hx of    Patient Active Problem List   Diagnosis Date Noted  . Pneumonia due to COVID-19 virus 03/03/2020  . Chronic migraine without aura without status migrainosus, not intractable 02/11/2020  . Anxiety 10/23/2019  . Dizziness 07/02/2019  . Tremor of both hands 03/27/2018  . Syncopal episodes 09/10/2017  . HTN (hypertension) 05/09/2017  . Menorrhagia 11/11/2016  .  Alopecia 03/02/2016  . Bilateral cataracts 08/14/2015  . Acne 10/21/2014  . Liver lesion 09/09/2014  . Vitamin D deficiency 09/09/2014  . Hot flashes 06/10/2014  . Chronic pain syndrome 05/20/2014  . Vitamin B12 deficiency 03/12/2014  . Contraception management 09/20/2013  . IBS (irritable bowel syndrome) 09/20/2013  . Unspecified vitamin D deficiency 04/03/2013  . Dyslipidemia 04/03/2013  . Osteopenia 04/03/2013  . POTS (postural orthostatic tachycardia syndrome) 02/26/2013  . Migraine 11/29/2012  . Allergic rhinitis 10/05/2012  . Mixed bipolar I disorder in remission ([place]) 10/05/2012  . Combined fat and carbohydrate induced hyperlipemia 10/05/2012  . Fibromyalgia   . Bipolar disorder ([place])   . Fatigue 09/10/2011  . Clinical depression 09/10/2011  . Acid reflux 09/10/2011  . Non-restorative sleep 09/10/2011  . Disturbance in sleep behavior 09/10/2011    Past Surgical History:  Procedure Laterality Date  . CATARACT EXTRACTION  dec 2014  . DILATION AND CURETTAGE OF UTERUS    . LASIK  dec 2014    Prior to Admission medications   Medication Sig Start Date End Date Taking? Authorizing Provider  acetaminophen (TYLENOL) 500 MG tablet Take 1,000 mg by mouth daily as needed for mild pain.    [provider]  buPROPion (WELLBUTRIN XL) 150 MG 24 hr tablet Take 1 tablet (150 mg total) by mouth daily. 07/17/19   Lucille Passy, MD  cholecalciferol (VITAMIN D) 1000 units tablet Take 5,000 Units by mouth daily.    [provider]  clonazePAM (KLONOPIN) 1 MG tablet  Take 1 tablet (1 mg total) by mouth 2 (two) times daily as needed. for anxiety 05/26/16   Lucille Passy, MD  diclofenac sodium (VOLTAREN) 1 % GEL Apply to affected area three to four times daily. 01/27/17   Lucille Passy, MD  fluvoxaMINE (LUVOX) 50 MG tablet Take 50-100 mg by mouth at bedtime.  05/23/17   [provider]  gabapentin (NEURONTIN) 600 MG tablet Take 1 tablet (600 mg total) by mouth 3 (three)  times daily. 07/17/19   Lucille Passy, MD  Galcanezumab-gnlm [facility]) 120 MG/ML SOAJ Inject 120 mg into the skin every 30 (thirty) days. 02/11/20   Melvenia Beam, MD  HYDROcodone-acetaminophen (NORCO/VICODIN) 5-325 MG tablet Take 1 tablet by mouth every 6 (six) hours as needed for moderate pain. 01/25/20   Cletis Athens, MD  methocarbamol (ROBAXIN) 500 MG tablet Take 1 tablet (500 mg total) by mouth 4 (four) times daily. 02/11/20   Cletis [place], MD  norgestimate-ethinyl estradiol (SPRINTEC 28) 0.25-35 MG-MCG tablet Take 1 tablet by mouth daily. 07/17/19   Lucille Passy, MD  ondansetron (ZOFRAN-ODT) 4 MG disintegrating tablet Take 1 tablet (4 mg total) by mouth every 8 (eight) hours as needed for nausea or vomiting. 07/17/19   Lucille Passy, MD  rizatriptan (MAXALT-MLT) 10 MG disintegrating tablet Take 1 tablet (10 mg total) by mouth as needed for migraine. May repeat in 2 hours if needed 02/11/20   Melvenia Beam, MD  verapamil (CALAN-SR) 120 MG CR tablet Take 1 tablet (120 mg total) by mouth at bedtime. 07/17/19   Lucille Passy, MD  vitamin B-12 (CYANOCOBALAMIN) 1000 MCG tablet Take 1,000 mcg by mouth daily.    [provider]    Allergies Lamotrigine, Abilify [aripiprazole], Azithromycin, Depakote [divalproex sodium], Erythromycin base, Lithium, Penicillins, Pregabalin, Seroquel [quetiapine fumarate], Sulfa antibiotics, Tetanus toxoids, Tramadol, and Ziprasidone hcl  Family History  Problem Relation Age of Onset  . Diabetes Mother   . Hypertension Mother   . Brain cancer Father   . Ovarian cancer Maternal Grandmother        metastasized  . Lung cancer Maternal Grandfather   . Cancer Paternal Grandmother        not sure of the type  . Cancer Paternal Grandfather        not sure of the type  . Migraines Neg Hx     Social History Social History   Tobacco Use  . Smoking status: Never Smoker  . Smokeless tobacco: Never Used  Vaping Use  . Vaping Use: Never used  Substance  Use Topics  . Alcohol use: No    Alcohol/week: 0.0 standard drinks  . Drug use: No    Review of Systems  Review of Systems  Constitutional: Positive for chills, fever and malaise/fatigue.  HENT: Positive for congestion and sore throat.   Eyes: Negative for pain.  Respiratory: Positive for cough and shortness of breath. Negative for stridor.   Cardiovascular: Negative for chest pain.  Gastrointestinal: Positive for diarrhea. Negative for vomiting.  Skin: Negative for rash.  Neurological: Negative for seizures, loss of consciousness and headaches.  Psychiatric/Behavioral: Negative for suicidal ideas.  All other systems reviewed and are negative.     ____________________________________________   PHYSICAL EXAM:  VITAL SIGNS: ED Triage Vitals [03/03/20 0904]  Enc Vitals Group     BP (!) 104/46     Pulse Rate (!) 104     Resp (!) 22     Temp 98.4 F (36.9  C)     Temp Source Oral     SpO2 (!) 89 %     Weight 155 lb (70.3 kg)     Height 5\' 9"  (1.753 m)     Head Circumference      Peak Flow      Pain Score 3     Pain Loc      Pain Edu?      Excl. in [place]?    Vitals:   03/03/20 1217 03/03/20 1317  BP:  (!) 119/59  Pulse:  (!) 101  Resp:  (!) 26  Temp:  99.9 F (37.7 C)  SpO2: 90% 92%   Physical Exam Vitals and nursing note reviewed.  Constitutional:      General: She is not in acute distress.    Appearance: She is well-developed.  HENT:     Head: Normocephalic and atraumatic.     Right Ear: External ear normal.     Left Ear: External ear normal.     Nose: Nose normal.     Mouth/Throat:     Mouth: Mucous membranes are dry.     Pharynx: Posterior oropharyngeal erythema present. No oropharyngeal exudate.  Eyes:     Conjunctiva/sclera: Conjunctivae normal.  Cardiovascular:     Rate and Rhythm: Regular rhythm. Tachycardia present.     Heart sounds: No murmur heard.   Pulmonary:     Effort: Pulmonary effort is normal. Tachypnea present. No respiratory  distress.     Breath sounds: Decreased breath sounds and rhonchi present.  Abdominal:     Palpations: Abdomen is soft.     Tenderness: There is no abdominal tenderness. There is no right CVA tenderness or left CVA tenderness.  Musculoskeletal:     Cervical back: Neck supple.  Skin:    General: Skin is warm and dry.     Capillary Refill: Capillary refill takes less than 2 seconds.  Neurological:     Mental Status: She is alert and oriented to person, place, and time.  Psychiatric:        Mood and Affect: Mood normal.      ____________________________________________   LABS (all labs ordered are listed, but only abnormal results are displayed)  Labs Reviewed  SARS CORONAVIRUS 2 BY RT PCR ([facility]) - Abnormal; Notable for the following components:      Result Value   SARS Coronavirus 2 POSITIVE (*)    All other components within normal limits  BASIC METABOLIC PANEL - Abnormal; Notable for the following components:   Chloride 97 (*)    Glucose, Bld 121 (*)    All other components within normal limits  FIBRIN DERIVATIVES D-DIMER ([facility] ONLY) - Abnormal; Notable for the following components:   Fibrin derivatives D-dimer ([facility]) 933.83 (*)    All other components within normal limits  HEPATIC FUNCTION PANEL - Abnormal; Notable for the following components:   Albumin 2.9 (*)    AST 104 (*)    ALT 51 (*)    Total Bilirubin 1.3 (*)    Bilirubin, Direct 0.6 (*)    All other components within normal limits  CBC  MAGNESIUM  PROCALCITONIN  TROPONIN I (HIGH SENSITIVITY)   ____________________________________________  EKG  Sinus tachycardia with a ventricular rate of 104, normal axis, unremarkable intervals, no evidence of acute ischemia or other underlying arrhythmia. ____________________________________________  RADIOLOGY  ED MD interpretation:    Official radiology report(s): DG Chest 2 View  Result Date: 03/03/2020  CLINICAL  DATA:  Shortness of breath and cough EXAM: CHEST - 2 VIEW COMPARISON:  September 16, 2018 FINDINGS: There is ill-defined airspace opacity in the left base. There is slight atelectatic change in the mid lung regions bilaterally and right base. Heart size and pulmonary vascularity are normal. No adenopathy. No bone lesions. IMPRESSION: Focal airspace opacity felt to represent focus of pneumonia left base. Subtle atelectasis right base and left mid lung regions in right base. Question early pneumonia in these areas as well. This appearance warrants check of COVID-19 status. Heart size normal.  No adenopathy. Electronically Signed   By: Lowella Grip III M.D.   On: 03/03/2020 09:39   Focal consolidation on the left base.  No evidence of pneumothorax, effusion, or significant edema. ____________________________________________   PROCEDURES  Procedure(s) performed (including Critical Care):  .Critical Care Performed by: Lucrezia Starch, MD Authorized by: Lucrezia Starch, MD   Critical care provider statement:    Critical care time (minutes):  75   Critical care time was exclusive of:  Separately billable procedures and treating other patients   Critical care was necessary to treat or prevent imminent or life-threatening deterioration of the following conditions:  Respiratory failure   Critical care was time spent personally by me on the following activities:  Discussions with consultants, evaluation of patient's response to treatment, examination of patient, ordering and performing treatments and interventions, ordering and review of laboratory studies, ordering and review of radiographic studies, pulse oximetry, re-evaluation of patient's condition, obtaining history from patient or surrogate and review of old charts     ____________________________________________   INITIAL IMPRESSION / [place] / ED COURSE        Patient presents with [place] to history exam after being referred  to the ED from clinic after she was noted to have a new oxygen requirement of 4 L today.  This is in the setting of above-noted constellation of symptoms progressing over the past week.  Patient is requiring 4 L of nasal cannula to maintain her SP2 saturations of 90% in the ED.  She is otherwise tachypneic in the mid 20s with otherwise stable vital signs on room air.  Patient initially was febrile but received Tylenol in triage and on recheck of vitals her fever has defervesced.  Remainder of exam as above.  Chest x-ray does show findings consistent with either bacterial or viral pneumonia including COVID-19.  Given D-dimer is elevated will plan to obtain CTA chest to assess for evidence of PE.  Low suspicion for ACS given relatively reassuring EKG with a troponin of 11.  Is not consistent with myocarditis either.  Patient is Covid is positive.  Decadron around and is very ordered.  No obstructive disease history or alteration of mental status to suggest hypercapnic respiratory failure.  We will plan to admit to hospital service for further evaluation management of acute hypoxic respiratory failure secondary to COVID-19 pneumonia.   ____________________________________________   FINAL CLINICAL IMPRESSION(S) / ED DIAGNOSES  Final diagnoses:  Acute respiratory failure with hypoxia (HCC)  COVID-19  Positive D dimer  Transaminitis    Medications  remdesivir 200 mg in sodium chloride 0.9% 250 mL IVPB (has no administration in time range)    Followed by  remdesivir 100 mg in sodium chloride 0.9 % 100 mL IVPB (has no administration in time range)  methylPREDNISolone sodium succinate (SOLU-MEDROL) 40 mg/mL injection 40 mg (has no administration in time range)  acetaminophen (TYLENOL) tablet 650 mg (has  no administration in time range)  ondansetron (ZOFRAN) injection 4 mg (has no administration in time range)  ipratropium (ATROVENT HFA) inhaler 2 puff (has no administration in time range)  albuterol  (VENTOLIN HFA) 108 (90 Base) MCG/ACT inhaler 2 puff (has no administration in time range)  dextromethorphan-guaiFENesin (MUCINEX DM) 30-600 MG per 12 hr tablet 1 tablet (has no administration in time range)  ascorbic acid (VITAMIN C) tablet 500 mg (has no administration in time range)  zinc sulfate capsule 220 mg (has no administration in time range)  acetaminophen (TYLENOL) tablet 1,000 mg (1,000 mg Oral Given 03/03/20 1212)     ED Discharge Orders    None       Note:  This document was prepared using Dragon voice recognition software and may include unintentional dictation errors.   Lucrezia Starch, MD 03/03/20 1455

## 2020-03-03 NOTE — ED Notes (Signed)
Patient is being discharged from the Urgent Care and sent to the Emergency Department via POV . Per [place], [place], patient is in need of higher level of care due to hypoxia, ShOB, cough. Patient is aware and verbalizes understanding of plan of care.  Vitals:   03/03/20 0832  BP: (!) 97/53  Pulse: (!) 104  Resp: 20  Temp: 98.5 F (36.9 C)  SpO2: (!) 83%

## 2020-03-03 NOTE — H&P (Signed)
History and Physical    Melissa [name] MHD:[id number] DOB: 08-18-1970 DOA: 03/03/2020  Referring MD/NP/PA:   PCP: Cletis Athens, MD   Patient coming from:  The patient is coming from home.  At baseline, pt is independent for most of ADL.        Chief Complaint: Cough, shortness of breath  HPI: Melissa [name] is a 49 y.o. female with medical history significant of hypertension, GERD, depression with anxiety, uterine perforation, bipolar, migraine, fibromyalgia, syncope, IBS, who presents with cough, shortness breath.  Patient has been having cough, shortness of breath for more than 6 days.  Patient also has sinus drainage, body aches, poor appetite, generalized weakness, headache.  No chest pain.  Patient has fever and chills. She has some diarrhea, nausea and vomited once.  No abdominal pain.  No symptoms of UTI or unilateral weakness.  ED Course: pt was found to have positive COVID-19 PCR, troponin level 11, WBC 7.9, electrolytes renal function okay, abnormal liver function (ALP 74, AST 104, ALT 51, total bilirubin 0.7), temperature mild 2.7, soft blood pressure, tachycardia with heart rate 111, RR 26, oxygen desaturation to 93% on room air, which improved to 92 on 4 L nasal cannula oxygen.  Chest x-ray showed bilateral multifocal infiltration. CTA negative for PE.  Patient is admitted to [facility] bed as inpatient.  Review of Systems:   General: has fevers, chills, no body weight gain, has poor appetite, has fatigue HEENT: no blurry vision, hearing changes or sore throat Respiratory: has dyspnea, coughing, no wheezing CV: no chest pain, no palpitations GI: has nausea, vomiting, diarrhea, no constipation abdominal pain, GU: no dysuria, burning on urination, increased urinary frequency, hematuria  Ext: no leg edema Neuro: no unilateral weakness, numbness, or tingling, no vision change or hearing loss Skin: no rash, no skin tear. MSK: No muscle spasm, no deformity, no limitation of range of  movement in spin Heme: No easy bruising.  Travel history: No recent long distant travel.  Allergy:  Allergies  Allergen Reactions  . Lamotrigine Hives and Swelling  . Abilify [Aripiprazole] Other (See Comments)    Weight gain, did not help,   . Azithromycin Rash  . Depakote [Divalproex Sodium] Other (See Comments)    Hair loss  . Erythromycin Base Swelling  . Lithium Rash  . Penicillins Rash    Has patient had a PCN reaction causing immediate rash, facial/tongue/throat swelling, SOB or lightheadedness with hypotension: Yes- swelling, rash  Has patient had a PCN reaction causing severe rash involving mucus membranes or skin necrosis: Yes Has patient had a PCN reaction that required hospitalization No Has patient had a PCN reaction occurring within the last 10 years: No If all of the above answers are "NO", then may proceed with Cephalosporin use.   . Pregabalin Palpitations    Manic symptoms, no sleep x's 3 days, no appetite.  . Seroquel [Quetiapine Fumarate] Other (See Comments)    Irritable  . Sulfa Antibiotics Rash  . Tetanus Toxoids Other (See Comments)    Swelling to injection site  . Tramadol Nausea And Vomiting  . Ziprasidone Hcl Other (See Comments)    Heat/cold intolerance.     Past Medical History:  Diagnosis Date  . Arthritis   . Bipolar disorder ([place])   . Fibromyalgia    on disability  . Migraine   . Muscle spasm   . Pain of right side of body    chronic  . Panic attack   . POTS (postural orthostatic  tachycardia syndrome)   . Syncope    recurrent  . Uterine perforation    hx of    Past Surgical History:  Procedure Laterality Date  . CATARACT EXTRACTION  dec 2014  . DILATION AND CURETTAGE OF UTERUS    . LASIK  dec 2014    Social History:  reports that she has never smoked. She has never used smokeless tobacco. She reports that she does not drink alcohol and does not use drugs.  Family History:  Family History  Problem Relation Age of Onset  .  Diabetes Mother   . Hypertension Mother   . Brain cancer Father   . Ovarian cancer Maternal Grandmother        metastasized  . Lung cancer Maternal Grandfather   . Cancer Paternal Grandmother        not sure of the type  . Cancer Paternal Grandfather        not sure of the type  . Migraines Neg Hx      Prior to Admission medications   Medication Sig Start Date End Date Taking? Authorizing Provider  acetaminophen (TYLENOL) 500 MG tablet Take 1,000 mg by mouth daily as needed for mild pain.    [provider]  buPROPion (WELLBUTRIN XL) 150 MG 24 hr tablet Take 1 tablet (150 mg total) by mouth daily. 07/17/19   Lucille Passy, MD  cholecalciferol (VITAMIN D) 1000 units tablet Take 5,000 Units by mouth daily.    [provider]  clonazePAM (KLONOPIN) 1 MG tablet Take 1 tablet (1 mg total) by mouth 2 (two) times daily as needed. for anxiety 05/26/16   Lucille Passy, MD  diclofenac sodium (VOLTAREN) 1 % GEL Apply to affected area three to four times daily. 01/27/17   Lucille Passy, MD  fluvoxaMINE (LUVOX) 50 MG tablet Take 50-100 mg by mouth at bedtime.  05/23/17   [provider]  gabapentin (NEURONTIN) 600 MG tablet Take 1 tablet (600 mg total) by mouth 3 (three) times daily. 07/17/19   Lucille Passy, MD  Galcanezumab-gnlm [facility]) 120 MG/ML SOAJ Inject 120 mg into the skin every 30 (thirty) days. 02/11/20   Melvenia Beam, MD  HYDROcodone-acetaminophen (NORCO/VICODIN) 5-325 MG tablet Take 1 tablet by mouth every 6 (six) hours as needed for moderate pain. 01/25/20   Cletis Athens, MD  methocarbamol (ROBAXIN) 500 MG tablet Take 1 tablet (500 mg total) by mouth 4 (four) times daily. 02/11/20   Cletis Athens, MD  norgestimate-ethinyl estradiol (SPRINTEC 28) 0.25-35 MG-MCG tablet Take 1 tablet by mouth daily. 07/17/19   Lucille Passy, MD  ondansetron (ZOFRAN-ODT) 4 MG disintegrating tablet Take 1 tablet (4 mg total) by mouth every 8 (eight) hours as needed for nausea or vomiting.  07/17/19   Lucille Passy, MD  rizatriptan (MAXALT-MLT) 10 MG disintegrating tablet Take 1 tablet (10 mg total) by mouth as needed for migraine. May repeat in 2 hours if needed 02/11/20   Melvenia Beam, MD  verapamil (CALAN-SR) 120 MG CR tablet Take 1 tablet (120 mg total) by mouth at bedtime. 07/17/19   Lucille Passy, MD  vitamin B-12 (CYANOCOBALAMIN) 1000 MCG tablet Take 1,000 mcg by mouth daily.    [provider]    Physical Exam: Vitals:   03/03/20 0904 03/03/20 1204 03/03/20 1217 03/03/20 1317  BP: (!) 104/46 109/65  (!) 119/59  Pulse: (!) 104 (!) 111  (!) 101  Resp: (!) 22 (!) 22  (!) 26  Temp: 98.4 F (36.9 C) (!) 102.7 F (39.3 C)  99.9 F (37.7 C)  TempSrc: Oral Oral  Oral  SpO2: (!) 89% 90% 90% 92%  Weight: 70.3 kg     Height: 5\' 9"  (1.753 m)      General: Not in acute distress HEENT:       Eyes: PERRL, EOMI, no scleral icterus.       ENT: No discharge from the ears and nose, no pharynx injection, no tonsillar enlargement.        Neck: No JVD, no bruit, no mass felt. Heme: No neck lymph node enlargement. Cardiac: S1/S2, RRR, No murmurs, No gallops or rubs. Respiratory:  No rales, wheezing, rhonchi or rubs. GI: Soft, nondistended, nontender, no rebound pain, no organomegaly, BS present. GU: No hematuria Ext: No pitting leg edema bilaterally. 2+DP/PT pulse bilaterally. Musculoskeletal: No joint deformities, No joint redness or warmth, no limitation of ROM in spin. Skin: No rashes.  Neuro: Alert, oriented X3, cranial nerves II-XII grossly intact, moves all extremities normally.  Psych: Patient is not psychotic, no suicidal or hemocidal ideation.  Labs on Admission: I have personally reviewed following labs and imaging studies  CBC: Recent Labs  Lab 03/03/20 0914  WBC 7.9  HGB 13.7  HCT 40.8  MCV 85.0  PLT 784   Basic Metabolic Panel: Recent Labs  Lab 03/03/20 0914 03/03/20 1304  NA 137  --   K 3.9  --   CL 97*  --   CO2 28  --   GLUCOSE 121*   --   BUN 12  --   CREATININE 0.99  --   CALCIUM 9.1  --   MG  --  1.9   GFR: Estimated Creatinine Clearance: 71 mL/min (by C-G formula based on SCr of 0.99 mg/dL). Liver Function Tests: Recent Labs  Lab 03/03/20 1304  AST 104*  ALT 51*  ALKPHOS 74  BILITOT 1.3*  PROT 6.8  ALBUMIN 2.9*   No results for input(s): LIPASE, AMYLASE in the last 168 hours. No results for input(s): AMMONIA in the last 168 hours. Coagulation Profile: No results for input(s): INR, PROTIME in the last 168 hours. Cardiac Enzymes: No results for input(s): CKTOTAL, CKMB, CKMBINDEX, TROPONINI in the last 168 hours. BNP (last 3 results) No results for input(s): PROBNP in the last 8760 hours. HbA1C: No results for input(s): HGBA1C in the last 72 hours. CBG: No results for input(s): GLUCAP in the last 168 hours. Lipid Profile: No results for input(s): CHOL, HDL, LDLCALC, TRIG, CHOLHDL, LDLDIRECT in the last 72 hours. Thyroid Function Tests: No results for input(s): TSH, T4TOTAL, FREET4, T3FREE, THYROIDAB in the last 72 hours. Anemia Panel: No results for input(s): VITAMINB12, FOLATE, FERRITIN, TIBC, IRON, RETICCTPCT in the last 72 hours. Urine analysis:    Component Value Date/Time   COLORURINE YELLOW (A) 06/26/2019 1720   APPEARANCEUR CLEAR (A) 06/26/2019 1720   APPEARANCEUR Clear 11/04/2012 1650   LABSPEC 1.019 06/26/2019 1720   LABSPEC 1.012 11/04/2012 1650   PHURINE 5.0 06/26/2019 1720   GLUCOSEU NEGATIVE 06/26/2019 1720   GLUCOSEU Negative 11/04/2012 1650   HGBUR NEGATIVE 06/26/2019 1720   BILIRUBINUR NEGATIVE 06/26/2019 1720   BILIRUBINUR Negative 09/08/2017 0904   BILIRUBINUR Negative 11/04/2012 1650   KETONESUR NEGATIVE 06/26/2019 1720   PROTEINUR NEGATIVE 06/26/2019 1720   UROBILINOGEN 0.2 09/08/2017 0904   UROBILINOGEN 0.2 10/21/2014 1321   NITRITE NEGATIVE 06/26/2019 1720   LEUKOCYTESUR MODERATE (A) 06/26/2019 1720   LEUKOCYTESUR Trace 11/04/2012 1650  Sepsis  Labs: @LABRCNTIP (procalcitonin:4,lacticidven:4) ) Recent Results (from the past 240 hour(s))  SARS Coronavirus 2 by RT PCR (hospital order, performed in [facility] hospital lab) Nasopharyngeal Nasopharyngeal Swab     Status: Abnormal   Collection Time: 03/03/20  1:04 PM   Specimen: Nasopharyngeal Swab  Result Value Ref Range Status   SARS Coronavirus 2 POSITIVE (A) NEGATIVE Final    Comment: RESULT CALLED TO, READ BACK BY AND VERIFIED WITH:  [place] AT 3790 03/03/20 SDR (NOTE) SARS-CoV-2 target nucleic acids are DETECTED  SARS-CoV-2 RNA is generally detectable in upper respiratory specimens  during the acute phase of infection.  Positive results are indicative  of the presence of the identified virus, but do not rule out bacterial infection or co-infection with other pathogens not detected by the test.  Clinical correlation with patient history and  other diagnostic information is necessary to determine patient infection status.  The expected result is negative.  Fact Sheet for Patients:   StrictlyIdeas.no   Fact Sheet for Healthcare Providers:   BankingDealers.co.za    This test is not yet approved or cleared by the [place] FDA and  has been authorized for detection and/or diagnosis of SARS-CoV-2 by FDA under an Emergency Use Authorization (EUA).  This EUA will remain in effect (meaning this test  can be used) for the duration of  the COVID-19 declaration under Section 564(b)(1) of the Act, 21 U.S.C. section 360-bbb-3(b)(1), unless the authorization is terminated or revoked sooner.  Performed at [facility], [address] [place], [place] [address]      Radiological Exams on Admission: DG Chest 2 View  Result Date: 03/03/2020 CLINICAL DATA:  Shortness of breath and cough EXAM: CHEST - 2 VIEW COMPARISON:  September 16, 2018 FINDINGS: There is ill-defined airspace opacity in the left base. There is slight  atelectatic change in the mid lung regions bilaterally and right base. Heart size and pulmonary vascularity are normal. No adenopathy. No bone lesions. IMPRESSION: Focal airspace opacity felt to represent focus of pneumonia left base. Subtle atelectasis right base and left mid lung regions in right base. Question early pneumonia in these areas as well. This appearance warrants check of COVID-19 status. Heart size normal.  No adenopathy. Electronically Signed   By: Lowella Grip III M.D.   On: 03/03/2020 09:39   CT Angio Chest PE W and/or Wo Contrast  Result Date: 03/03/2020 CLINICAL DATA:  Dyspnea, fever, COVID pneumonia, EXAM: CT ANGIOGRAPHY CHEST WITH CONTRAST TECHNIQUE: Multidetector CT imaging of the chest was performed using the standard protocol during bolus administration of intravenous contrast. Multiplanar CT image reconstructions and MIPs were obtained to evaluate the vascular anatomy. CONTRAST:  7mL OMNIPAQUE IOHEXOL 350 MG/ML SOLN COMPARISON:  None. FINDINGS: Cardiovascular: Satisfactory opacification of the pulmonary arteries to the segmental level. No evidence of pulmonary embolism. Normal heart size. No pericardial effusion. The thoracic aorta is of normal caliber Mediastinum/Nodes: There is shotty prevascular and bilateral hilar adenopathy present, possibly reactive in nature. No frankly pathologic thoracic adenopathy. The visualized thyroid is unremarkable. Esophagus is unremarkable. Lungs/Pleura: There are extensive bilateral scattered ground-glass pulmonary infiltrates demonstrating a peripheral, subpleural predominance in keeping with changes of atypical infection and compatible with the given history of COVID-19 pneumonia. Lung volumes are small, but are symmetric. No pneumothorax or pleural effusion. Central airways are widely patent. Upper Abdomen: No acute abnormality. Musculoskeletal: No chest wall abnormality. No acute or significant osseous findings. Review of the MIP images  confirms the above findings.  IMPRESSION: 1. No evidence of pulmonary embolism. 2. Extensive bilateral scattered ground-glass pulmonary infiltrates demonstrating a peripheral, subpleural predominance in keeping with changes of atypical infection and compatible with the given history of COVID-19 pneumonia. 3. Shotty prevascular and bilateral hilar adenopathy, possibly reactive in nature. Electronically Signed   By: Fidela Salisbury MD   On: 03/03/2020 15:41     EKG: Independently reviewed.  Sinus rhythm, QTC 423, LVE, low voltage, poor R wave progression   Assessment/Plan Principal Problem:   Pneumonia due to COVID-19 virus Active Problems:   Bipolar disorder (HCC)   Migraine   Acid reflux   HTN (hypertension)   Depression with anxiety   Sepsis ([place])   Acute respiratory failure with hypoxia (HCC)    Acute respiratory failure with hypoxia due to pneumonia due to COVID-19 virus: Oxygen desaturated to 83% on room air.  Chest x-ray showed multifocal infiltration bilaterally.  Currently on 4 L nasal cannula oxygen. CTA negative for PE.  -will admit to med-surg bed as inpt -Remdesivir per pharm -Solumedrol 40 mg bid -vitamin C, zinc.  -Bronchodilators -PRN Mucinex for cough -f/u Blood culture D-dimer, BNP,Trop, LFT, CRP, LDH, Procalcitonin, Ferritin, fibinogen, TG, Hep B SAg, HIV ab -Daily CRP, Ferritin, D-dimer, -Will ask the patient to maintain an awake prone position for 16+ hours a day, if possible, with a minimum of 2-3 hours at a time -Will attempt to maintain euvolemia to a net negative fluid status -IF patient deteriorates, will consult PCCM and ID  Sepsis due to Covid infection: Patient meets criteria for sepsis with fever, leukocytosis with heart rate 111 and tachypnea with RR 26.  Blood pressure is soft.  Currently hemodynamically stable. -will get Procalcitonin and trend lactic acid levels per sepsis protocol. -IVF: will hold off IVF unless lactic is significantly elevated due  to Covid pneumonia.  Migraine: Patient received Emgality 3 weeks ago at home -Rizatriptan as needed  Bipolar disorder, depression with anxiety: no SI or HI. -Continue home medications  Acid reflux -As needed Pepcid  HTN (hypertension): Blood pressures are soft, 97/53, 119/59 -hold home verapamil since blood pressures are soft    DVT ppx: SQ Lovenox Code Status: Full code Family Communication: not done, no family member is at bed side.  Disposition Plan:  Anticipate discharge back to previous environment Consults called:  none Admission status: Med-surg bed as inpt      Status is: Inpatient  Remains inpatient appropriate because:Inpatient level of care appropriate due to severity of illness patient has multiple comorbidities, now presents with pneumonia due to COVID-19 infection.  Patient has sepsis, acute respiratory failure with hypoxia.  Has new oxygen requirement.  Her presentation is highly complicated.  Patient is at high risk of deteriorating.  Will need to be treated in the hospital for at least 2 days.   Dispo: The patient is from: Home              Anticipated d/c is to: Home              Anticipated d/c date is: 2 days              Patient currently is not medically stable to d/c.           Date of Service 03/03/2020    Ivor Costa [facility]   If 7PM-7AM, please contact night-coverage [email or web address] 03/03/2020, 5:15 PM

## 2020-03-04 LAB — CBC WITH DIFFERENTIAL/PLATELET
Abs Immature Granulocytes: 0.03 10*3/uL (ref 0.00–0.07)
Basophils Absolute: 0 10*3/uL (ref 0.0–0.1)
Basophils Relative: 0 %
Eosinophils Absolute: 0 10*3/uL (ref 0.0–0.5)
Eosinophils Relative: 0 %
HCT: 37.5 % (ref 36.0–46.0)
Hemoglobin: 12.5 g/dL (ref 12.0–15.0)
Immature Granulocytes: 1 %
Lymphocytes Relative: 9 %
Lymphs Abs: 0.5 10*3/uL — ABNORMAL LOW (ref 0.7–4.0)
MCH: 28.7 pg (ref 26.0–34.0)
MCHC: 33.3 g/dL (ref 30.0–36.0)
MCV: 86 fL (ref 80.0–100.0)
Monocytes Absolute: 0.2 10*3/uL (ref 0.1–1.0)
Monocytes Relative: 2 %
Neutro Abs: 5.5 10*3/uL (ref 1.7–7.7)
Neutrophils Relative %: 88 %
Platelets: 260 10*3/uL (ref 150–400)
RBC: 4.36 MIL/uL (ref 3.87–5.11)
RDW: 13.3 % (ref 11.5–15.5)
Smear Review: NORMAL
WBC: 6.2 10*3/uL (ref 4.0–10.5)
nRBC: 0 % (ref 0.0–0.2)

## 2020-03-04 LAB — COMPREHENSIVE METABOLIC PANEL
ALT: 61 U/L — ABNORMAL HIGH (ref 0–44)
AST: 117 U/L — ABNORMAL HIGH (ref 15–41)
Albumin: 2.9 g/dL — ABNORMAL LOW (ref 3.5–5.0)
Alkaline Phosphatase: 90 U/L (ref 38–126)
Anion gap: 11 (ref 5–15)
BUN: 14 mg/dL (ref 6–20)
CO2: 28 mmol/L (ref 22–32)
Calcium: 8.5 mg/dL — ABNORMAL LOW (ref 8.9–10.3)
Chloride: 99 mmol/L (ref 98–111)
Creatinine, Ser: 0.93 mg/dL (ref 0.44–1.00)
GFR calc Af Amer: >60 mL/min (ref >60)
GFR calc non Af Amer: >60 mL/min (ref >60)
Glucose, Bld: 134 mg/dL — ABNORMAL HIGH (ref 70–99)
Potassium: 4 mmol/L (ref 3.5–5.1)
Sodium: 138 mmol/L (ref 135–145)
Total Bilirubin: 0.9 mg/dL (ref 0.3–1.2)
Total Protein: 6.8 g/dL (ref 6.5–8.1)

## 2020-03-04 LAB — FERRITIN: Ferritin: 2875 ng/mL — ABNORMAL HIGH (ref 11–307)

## 2020-03-04 LAB — HEPATITIS B SURFACE ANTIGEN: Hepatitis B Surface Ag: NONREACTIVE

## 2020-03-04 LAB — FIBRIN DERIVATIVES D-DIMER (ARMC ONLY): Fibrin derivatives D-dimer (ARMC): 936.49 ng/mL (FEU) — ABNORMAL HIGH (ref 0.00–499.00)

## 2020-03-04 LAB — MAGNESIUM: Magnesium: 2.3 mg/dL (ref 1.7–2.4)

## 2020-03-04 LAB — PROCALCITONIN: Procalcitonin: 0.27 ng/mL

## 2020-03-04 LAB — HIV ANTIBODY (ROUTINE TESTING W REFLEX): HIV Screen 4th Generation wRfx: NONREACTIVE

## 2020-03-04 LAB — C-REACTIVE PROTEIN
CRP: 21.8 mg/dL — ABNORMAL HIGH (ref <1.0)
CRP: 20.4 mg/dL — ABNORMAL HIGH (ref <1.0)

## 2020-03-04 MED ORDER — BARICITINIB 2 MG PO TABS
4.0000 mg | ORAL_TABLET | Freq: Every day | ORAL | Status: DC
  Administered 2020-03-04 – 2020-03-12 (×9): 4 mg via ORAL
  Filled 2020-03-04 (×9): qty 2

## 2020-03-04 NOTE — Progress Notes (Signed)
PROGRESS NOTE    Melissa [name]  JSH:[id number] DOB: 07/03/1970 DOA: 03/03/2020 PCP: Cletis Athens, MD   Brief Narrative: Taken from H&P. Melissa [name] is a 49 y.o. female with medical history significant of hypertension, GERD, depression with anxiety, uterine perforation, bipolar, migraine, fibromyalgia, syncope, IBS, who presents with cough, shortness breath.  Found to be positive for COVID-19.  CTA negative for PE but did show multifocal pneumonia consistent with COVID-19.Unvaccinated. Procalcitonin at 0.52, initially met sepsis criteria.  Blood cultures negative. Started on remdesivir and steroid on 9/13.  Adding baricitinib on 9/14 due to worsening hypoxia.  Subjective: Patient was feeling fatigued and little SOB.Stating I wish I had Vaccine, I was too busy with other things and was not taking care of myself. Husband is vaccinated.  Assessment & Plan:   Principal Problem:   Pneumonia due to COVID-19 virus Active Problems:   Bipolar disorder ([place])   Migraine   Acid reflux   HTN (hypertension)   Depression with anxiety   Sepsis ([place])   Acute respiratory failure with hypoxia (HCC)  Acute hypoxic respiratory failure secondary to COVID-19 pneumonia/sepsis secondary to Covid infection.  Worsening hypoxia, now requiring 13L.  Elevated inflammatory markers.  Procalcitonin at 0.52. She was started on remdesivir and steroid. Initially meeting sepsis criteria with fever, tachycardia and tachypnea.  Blood cultures negative so far.  No lactic acidosis. -Start her on baricitinib-talked with patient and she agrees. -Continue remdesivir and steroid-day 2. -Continue supplemental oxygen to keep the saturation above 90%. -Encourage proning. -Continue with supportive care. -Extensive pulmonary toileting. -Continue to monitor inflammatory markers. -Monitor procalcitonin and leukocytes.  Migraine: Patient received Emgality 3 weeks ago at home -Rizatriptan as needed  Bipolar disorder,  depression with anxiety: no SI or HI. -Continue home medications  GERD.  -Continue Pepcid. -Add PPI as patient is on steroid which can worsen GERD.  Essential hypertension.  Blood pressure within goal today. Home dose of verapamil was held due to softer blood pressure on admission. -Continue holding verapamil-can be restarted if needed.  Objective: Vitals:   03/03/20 2318 03/03/20 2325 03/04/20 0440 03/04/20 0736  BP: (!) 126/53   111/60  Pulse: 78  94 97  Resp: (!) '22  18 14  ' Temp:   98.9 F (37.2 C) 98.4 F (36.9 C)  TempSrc:    Oral  SpO2: (!) 87% (!) 72% 96% 94%  Weight: 70.3 kg     Height: '5\' 9"'  (1.753 m)       Intake/Output Summary (Last 24 hours) at 03/04/2020 0749 Last data filed at 03/04/2020 0123 Gross per 24 hour  Intake 370 ml  Output --  Net 370 ml   Filed Weights   03/03/20 0904 03/03/20 2318  Weight: 70.3 kg 70.3 kg    Examination:  General exam: Well-developed lady, appears calm and comfortable  Respiratory system: Clear to auscultation. Respiratory effort normal. Cardiovascular system: S1 & S2 heard, RRR. No JVD, murmurs, Gastrointestinal system: Soft, nontender, nondistended, bowel sounds positive. Central nervous system: Alert and oriented. No focal neurological deficits. Extremities: No edema, no cyanosis, pulses intact and symmetrical. Psychiatry: Judgement and insight appear normal. Mood & affect appropriate.    DVT prophylaxis: Lovenox Code Status: Full Family Communication: Called husband with no response. Disposition Plan:  Status is: Inpatient  Remains inpatient appropriate because:Inpatient level of care appropriate due to severity of illness   Dispo: The patient is from: Home              Anticipated  d/c is to: Home              Anticipated d/c date is: 3 days              Patient currently is not medically stable to d/c.   Consultants:   None  Procedures:  Antimicrobials:   Data Reviewed: I have personally reviewed  following labs and imaging studies  CBC: Recent Labs  Lab 03/03/20 0914 03/04/20 0556  WBC 7.9 6.2  NEUTROABS  --  5.5  HGB 13.7 12.5  HCT 40.8 37.5  MCV 85.0 86.0  PLT 230 993   Basic Metabolic Panel: Recent Labs  Lab 03/03/20 0914 03/03/20 1304 03/04/20 0556  NA 137  --  138  K 3.9  --  4.0  CL 97*  --  99  CO2 28  --  28  GLUCOSE 121*  --  134*  BUN 12  --  14  CREATININE 0.99  --  0.93  CALCIUM 9.1  --  8.5*  MG  --  1.9 2.3   GFR: Estimated Creatinine Clearance: 75.6 mL/min (by C-G formula based on SCr of 0.93 mg/dL). Liver Function Tests: Recent Labs  Lab 03/03/20 1304 03/04/20 0556  AST 104* 117*  ALT 51* 61*  ALKPHOS 74 90  BILITOT 1.3* 0.9  PROT 6.8 6.8  ALBUMIN 2.9* 2.9*   No results for input(s): LIPASE, AMYLASE in the last 168 hours. No results for input(s): AMMONIA in the last 168 hours. Coagulation Profile: No results for input(s): INR, PROTIME in the last 168 hours. Cardiac Enzymes: No results for input(s): CKTOTAL, CKMB, CKMBINDEX, TROPONINI in the last 168 hours. BNP (last 3 results) No results for input(s): PROBNP in the last 8760 hours. HbA1C: No results for input(s): HGBA1C in the last 72 hours. CBG: No results for input(s): GLUCAP in the last 168 hours. Lipid Profile: Recent Labs    03/03/20 1304  TRIG 330*   Thyroid Function Tests: No results for input(s): TSH, T4TOTAL, FREET4, T3FREE, THYROIDAB in the last 72 hours. Anemia Panel: Recent Labs    03/03/20 1956 03/04/20 0556  FERRITIN 2,561* 2,875*   Sepsis Labs: Recent Labs  Lab 03/03/20 1304 03/03/20 1956  PROCALCITON 0.52  --   LATICACIDVEN  --  1.5    Recent Results (from the past 240 hour(s))  SARS Coronavirus 2 by RT PCR (hospital order, performed in [facility] hospital lab) Nasopharyngeal Nasopharyngeal Swab     Status: Abnormal   Collection Time: 03/03/20  1:04 PM   Specimen: Nasopharyngeal Swab  Result Value Ref Range Status   SARS Coronavirus 2  POSITIVE (A) NEGATIVE Final    Comment: RESULT CALLED TO, READ BACK BY AND VERIFIED WITH:  Time AT 7169 03/03/20 SDR (NOTE) SARS-CoV-2 target nucleic acids are DETECTED  SARS-CoV-2 RNA is generally detectable in upper respiratory specimens  during the acute phase of infection.  Positive results are indicative  of the presence of the identified virus, but do not rule out bacterial infection or co-infection with other pathogens not detected by the test.  Clinical correlation with patient history and  other diagnostic information is necessary to determine patient infection status.  The expected result is negative.  Fact Sheet for Patients:   StrictlyIdeas.no   Fact Sheet for Healthcare Providers:   BankingDealers.co.za    This test is not yet approved or cleared by the [place] FDA and  has been authorized for detection and/or diagnosis of SARS-CoV-2 by FDA under  an Emergency Use Authorization (EUA).  This EUA will remain in effect (meaning this test  can be used) for the duration of  the COVID-19 declaration under Section 564(b)(1) of the Act, 21 U.S.C. section 360-bbb-3(b)(1), unless the authorization is terminated or revoked sooner.  Performed at [facility], [address] [place], [place] [address]   Culture, blood (Routine X 2) w Reflex to ID Panel     Status: None (Preliminary result)   Collection Time: 03/03/20  7:55 PM   Specimen: BLOOD  Result Value Ref Range Status   Specimen Description BLOOD RIGHT [facility]  Final   Special Requests   Final    BOTTLES DRAWN AEROBIC AND ANAEROBIC Blood Culture adequate volume   Culture   Final    NO GROWTH < 12 HOURS Performed at [facility], [address], [place], [place] [address]    Report Status PENDING  Incomplete  Culture, blood (Routine X 2) w Reflex to ID Panel     Status: None (Preliminary result)   Collection Time: 03/03/20  7:56 PM   Specimen:  BLOOD  Result Value Ref Range Status   Specimen Description BLOOD LEFT AC  Final   Special Requests   Final    BOTTLES DRAWN AEROBIC AND ANAEROBIC Blood Culture adequate volume   Culture   Final    NO GROWTH < 12 HOURS Performed at [facility], [address], [place], [place] [address]    Report Status PENDING  Incomplete     Radiology Studies: DG Chest 2 View  Result Date: 03/03/2020 CLINICAL DATA:  Shortness of breath and cough EXAM: CHEST - 2 VIEW COMPARISON:  September 16, 2018 FINDINGS: There is ill-defined airspace opacity in the left base. There is slight atelectatic change in the mid lung regions bilaterally and right base. Heart size and pulmonary vascularity are normal. No adenopathy. No bone lesions. IMPRESSION: Focal airspace opacity felt to represent focus of pneumonia left base. Subtle atelectasis right base and left mid lung regions in right base. Question early pneumonia in these areas as well. This appearance warrants check of COVID-19 status. Heart size normal.  No adenopathy. Electronically Signed   By: Lowella Grip III M.D.   On: 03/03/2020 09:39   CT Angio Chest PE W and/or Wo Contrast  Result Date: 03/03/2020 CLINICAL DATA:  Dyspnea, fever, COVID pneumonia, EXAM: CT ANGIOGRAPHY CHEST WITH CONTRAST TECHNIQUE: Multidetector CT imaging of the chest was performed using the standard protocol during bolus administration of intravenous contrast. Multiplanar CT image reconstructions and MIPs were obtained to evaluate the vascular anatomy. CONTRAST:  54m OMNIPAQUE IOHEXOL 350 MG/ML SOLN COMPARISON:  None. FINDINGS: Cardiovascular: Satisfactory opacification of the pulmonary arteries to the segmental level. No evidence of pulmonary embolism. Normal heart size. No pericardial effusion. The thoracic aorta is of normal caliber Mediastinum/Nodes: There is shotty prevascular and bilateral hilar adenopathy present, possibly reactive in nature. No frankly pathologic thoracic  adenopathy. The visualized thyroid is unremarkable. Esophagus is unremarkable. Lungs/Pleura: There are extensive bilateral scattered ground-glass pulmonary infiltrates demonstrating a peripheral, subpleural predominance in keeping with changes of atypical infection and compatible with the given history of COVID-19 pneumonia. Lung volumes are small, but are symmetric. No pneumothorax or pleural effusion. Central airways are widely patent. Upper Abdomen: No acute abnormality. Musculoskeletal: No chest wall abnormality. No acute or significant osseous findings. Review of the MIP images confirms the above findings. IMPRESSION: 1. No evidence of pulmonary embolism. 2. Extensive bilateral scattered ground-glass pulmonary infiltrates  demonstrating a peripheral, subpleural predominance in keeping with changes of atypical infection and compatible with the given history of COVID-19 pneumonia. 3. Shotty prevascular and bilateral hilar adenopathy, possibly reactive in nature. Electronically Signed   By: Fidela Salisbury MD   On: 03/03/2020 15:41    Scheduled Meds: . vitamin C  500 mg Oral Daily  . baricitinib  4 mg Oral Daily  . buPROPion  150 mg Oral Daily  . cholecalciferol  5,000 Units Oral Daily  . enoxaparin (LOVENOX) injection  40 mg Subcutaneous Q24H  . fluvoxaMINE  50 mg Oral QHS  . gabapentin  600 mg Oral TID  . ipratropium  2 puff Inhalation Q4H  . methylPREDNISolone (SOLU-MEDROL) injection  40 mg Intravenous Q12H  . norgestimate-ethinyl estradiol  1 tablet Oral Daily  . vitamin B-12  1,000 mcg Oral Daily  . zinc sulfate  220 mg Oral Daily   Continuous Infusions: . remdesivir 100 mg in NS 100 mL       LOS: 1 day   Time spent: 35 minutes.  Lorella Nimrod, MD [facility]  If 7PM-7AM, please contact night-coverage [email or web address]  03/04/2020, 7:49 AM   This record has been created using Systems analyst. Errors have been sought and corrected,but may not always be located.  Such creation errors do not reflect on the standard of care.

## 2020-03-05 LAB — CBC WITH DIFFERENTIAL/PLATELET
Abs Immature Granulocytes: 0.07 10*3/uL (ref 0.00–0.07)
Basophils Absolute: 0 10*3/uL (ref 0.0–0.1)
Basophils Relative: 0 %
Eosinophils Absolute: 0 10*3/uL (ref 0.0–0.5)
Eosinophils Relative: 0 %
HCT: 36.9 % (ref 36.0–46.0)
Hemoglobin: 12.3 g/dL (ref 12.0–15.0)
Immature Granulocytes: 1 %
Lymphocytes Relative: 12 %
Lymphs Abs: 0.9 10*3/uL (ref 0.7–4.0)
MCH: 28.9 pg (ref 26.0–34.0)
MCHC: 33.3 g/dL (ref 30.0–36.0)
MCV: 86.8 fL (ref 80.0–100.0)
Monocytes Absolute: 0.5 10*3/uL (ref 0.1–1.0)
Monocytes Relative: 6 %
Neutro Abs: 6.1 10*3/uL (ref 1.7–7.7)
Neutrophils Relative %: 81 %
Platelets: 333 10*3/uL (ref 150–400)
RBC: 4.25 MIL/uL (ref 3.87–5.11)
RDW: 13.6 % (ref 11.5–15.5)
WBC: 7.6 10*3/uL (ref 4.0–10.5)
nRBC: 0 % (ref 0.0–0.2)

## 2020-03-05 LAB — FERRITIN: Ferritin: 3033 ng/mL — ABNORMAL HIGH (ref 11–307)

## 2020-03-05 LAB — COMPREHENSIVE METABOLIC PANEL
ALT: 59 U/L — ABNORMAL HIGH (ref 0–44)
AST: 87 U/L — ABNORMAL HIGH (ref 15–41)
Albumin: 2.7 g/dL — ABNORMAL LOW (ref 3.5–5.0)
Alkaline Phosphatase: 82 U/L (ref 38–126)
Anion gap: 11 (ref 5–15)
BUN: 21 mg/dL — ABNORMAL HIGH (ref 6–20)
CO2: 30 mmol/L (ref 22–32)
Calcium: 8.3 mg/dL — ABNORMAL LOW (ref 8.9–10.3)
Chloride: 98 mmol/L (ref 98–111)
Creatinine, Ser: 0.89 mg/dL (ref 0.44–1.00)
GFR calc Af Amer: >60 mL/min (ref >60)
GFR calc non Af Amer: >60 mL/min (ref >60)
Glucose, Bld: 144 mg/dL — ABNORMAL HIGH (ref 70–99)
Potassium: 3.7 mmol/L (ref 3.5–5.1)
Sodium: 139 mmol/L (ref 135–145)
Total Bilirubin: 0.6 mg/dL (ref 0.3–1.2)
Total Protein: 6.4 g/dL — ABNORMAL LOW (ref 6.5–8.1)

## 2020-03-05 LAB — PROCALCITONIN: Procalcitonin: 0.16 ng/mL

## 2020-03-05 LAB — FIBRIN DERIVATIVES D-DIMER (ARMC ONLY): Fibrin derivatives D-dimer (ARMC): 646.78 ng/mL (FEU) — ABNORMAL HIGH (ref 0.00–499.00)

## 2020-03-05 LAB — MAGNESIUM: Magnesium: 2.6 mg/dL — ABNORMAL HIGH (ref 1.7–2.4)

## 2020-03-05 LAB — C-REACTIVE PROTEIN: CRP: 8.6 mg/dL — ABNORMAL HIGH (ref <1.0)

## 2020-03-05 MED ORDER — PANTOPRAZOLE SODIUM 40 MG PO TBEC
40.0000 mg | DELAYED_RELEASE_TABLET | Freq: Every day | ORAL | Status: DC
  Administered 2020-03-05 – 2020-03-12 (×8): 40 mg via ORAL
  Filled 2020-03-05 (×8): qty 1

## 2020-03-05 MED ORDER — FUROSEMIDE 10 MG/ML IJ SOLN
20.0000 mg | Freq: Once | INTRAMUSCULAR | Status: AC
  Administered 2020-03-05: 20 mg via INTRAVENOUS
  Filled 2020-03-05: qty 2

## 2020-03-05 NOTE — Progress Notes (Signed)
PROGRESS NOTE    Melissa [name]  BRA:[id number] DOB: 1971/02/27 DOA: 03/03/2020 PCP: Cletis Athens, MD   Brief Narrative: Taken from H&P. Melissa [name] is a 49 y.o. female with medical history significant of hypertension, GERD, depression with anxiety, uterine perforation, bipolar, migraine, fibromyalgia, syncope, IBS, who presents with cough, shortness breath.  Found to be positive for COVID-19.  CTA negative for PE but did show multifocal pneumonia consistent with COVID-19.Unvaccinated. Procalcitonin at 0.52, initially met sepsis criteria.  Blood cultures negative. Started on remdesivir and steroid on 9/13.  Adding baricitinib on 9/14 due to worsening hypoxia.  Subjective: She was feeling very weak when seen today.Denies any wordening of breathing. Saturating around 90% on 15L, desaturate to high 80'es with talking and moving around in bed.  Assessment & Plan:   Principal Problem:   Pneumonia due to COVID-19 virus Active Problems:   Bipolar disorder ([place])   Migraine   Acid reflux   HTN (hypertension)   Depression with anxiety   Sepsis ([place])   Acute respiratory failure with hypoxia (HCC)  Acute hypoxic respiratory failure secondary to COVID-19 pneumonia/sepsis secondary to Covid infection.  Worsening hypoxia, now requiring 13L.  Elevated inflammatory markers.  Procalcitonin at 0.52>>0.16.  Most likely secondary to COVID-19 infection is improving without any antibiotics. She was started on remdesivir and steroid, baricitinib added on 03/04/2020 due to worsening hypoxia. Initially meeting sepsis criteria with fever, tachycardia and tachypnea.  Blood cultures negative so far.  No lactic acidosis. -Give her one dose of IV lasix 20 mg. -Continue baricitinib-Day 2 -Continue remdesivir and steroid-day 3. -Continue supplemental oxygen to keep the saturation above 90%- might need transfer to progressive for heated HFNC. -Encourage proning. -Continue with supportive care. -Extensive  pulmonary toileting. -Continue to monitor inflammatory markers. -Monitor procalcitonin and leukocytes.  Migraine: Patient received Emgality 3 weeks ago at home -Rizatriptan as needed  Bipolar disorder, depression with anxiety: no SI or HI. -Continue home medications  GERD.  -Continue Pepcid and Protonix.  Essential hypertension.  Blood pressure within goal today. Home dose of verapamil was held due to softer blood pressure on admission. -Continue holding verapamil-can be restarted if needed.  Objective: Vitals:   03/04/20 1927 03/04/20 2331 03/05/20 0456 03/05/20 0803  BP: 107/64 108/64 (!) 107/59 111/61  Pulse: 96 90 87 98  Resp: _0 (!) 22  Temp: 98.4 F (36.9 C) 98.1 F (36.7 C) 98 F (36.7 C) 98.8 F (37.1 C)  TempSrc: Oral Oral Oral Oral  SpO2: 94% 94% 93% 91%  Weight:      Height:        Intake/Output Summary (Last 24 hours) at 03/05/2020 0805 Last data filed at 03/04/2020 2130 Gross per 24 hour  Intake 1175 ml  Output --  Net 1175 ml   Filed Weights   03/03/20 0904 03/03/20 2318  Weight: 70.3 kg 70.3 kg    Examination:  General.  Well-developed lady, in no acute distress. Pulmonary. Few basal crackles, normal respiratory effort. CV.  Sinus tachycardia,no JVD, rub or murmur. Abdomen.  Soft, nontender, nondistended, BS positive. CNS.  Alert and oriented x3.  No focal neurologic deficit. Extremities.  No edema, no cyanosis, pulses intact and symmetrical. Psychiatry.  Judgment and insight appears normal.  DVT prophylaxis: Lovenox Code Status: Full Family Communication: Husband was updated on phone. Disposition Plan:  Status is: Inpatient  Remains inpatient appropriate because:Inpatient level of care appropriate due to severity of illness   Dispo: The patient is from: Home  Anticipated d/c is to: Home              Anticipated d/c date is: 3 days              Patient currently is not medically stable to d/c.   Consultants:    None  Procedures:  Antimicrobials:   Data Reviewed: I have personally reviewed following labs and imaging studies  CBC: Recent Labs  Lab 03/03/20 0914 03/04/20 0556 03/05/20 0525  WBC 7.9 6.2 7.6  NEUTROABS  --  5.5 6.1  HGB 13.7 12.5 12.3  HCT 40.8 37.5 36.9  MCV 85.0 86.0 86.8  PLT 230 260 588   Basic Metabolic Panel: Recent Labs  Lab 03/03/20 0914 03/03/20 1304 03/04/20 0556 03/05/20 0525  NA 137  --  138 139  K 3.9  --  4.0 3.7  CL 97*  --  99 98  CO2 28  --  28 30  GLUCOSE 121*  --  134* 144*  BUN 12  --  14 21*  CREATININE 0.99  --  0.93 0.89  CALCIUM 9.1  --  8.5* 8.3*  MG  --  1.9 2.3 2.6*   GFR: Estimated Creatinine Clearance: 79 mL/min (by C-G formula based on SCr of 0.89 mg/dL). Liver Function Tests: Recent Labs  Lab 03/03/20 1304 03/04/20 0556 03/05/20 0525  AST 104* 117* 87*  ALT 51* 61* 59*  ALKPHOS 74 90 82  BILITOT 1.3* 0.9 0.6  PROT 6.8 6.8 6.4*  ALBUMIN 2.9* 2.9* 2.7*   No results for input(s): LIPASE, AMYLASE in the last 168 hours. No results for input(s): AMMONIA in the last 168 hours. Coagulation Profile: No results for input(s): INR, PROTIME in the last 168 hours. Cardiac Enzymes: No results for input(s): CKTOTAL, CKMB, CKMBINDEX, TROPONINI in the last 168 hours. BNP (last 3 results) No results for input(s): PROBNP in the last 8760 hours. HbA1C: No results for input(s): HGBA1C in the last 72 hours. CBG: No results for input(s): GLUCAP in the last 168 hours. Lipid Profile: Recent Labs    03/03/20 1304  TRIG 330*   Thyroid Function Tests: No results for input(s): TSH, T4TOTAL, FREET4, T3FREE, THYROIDAB in the last 72 hours. Anemia Panel: Recent Labs    03/04/20 0556 03/05/20 0525  FERRITIN 2,875* 3,033*   Sepsis Labs: Recent Labs  Lab 03/03/20 1304 03/03/20 1956 03/04/20 0556 03/05/20 0525  PROCALCITON 0.52  --  0.27 0.16  LATICACIDVEN  --  1.5  --   --     Recent Results (from the past 240 hour(s))   SARS Coronavirus 2 by RT PCR (hospital order, performed in [facility] hospital lab) Nasopharyngeal Nasopharyngeal Swab     Status: Abnormal   Collection Time: 03/03/20  1:04 PM   Specimen: Nasopharyngeal Swab  Result Value Ref Range Status   SARS Coronavirus 2 POSITIVE (A) NEGATIVE Final    Comment: RESULT CALLED TO, READ BACK BY AND VERIFIED WITH:  [place] AT 5027 03/03/20 SDR (NOTE) SARS-CoV-2 target nucleic acids are DETECTED  SARS-CoV-2 RNA is generally detectable in upper respiratory specimens  during the acute phase of infection.  Positive results are indicative  of the presence of the identified virus, but do not rule out bacterial infection or co-infection with other pathogens not detected by the test.  Clinical correlation with patient history and  other diagnostic information is necessary to determine patient infection status.  The expected result is negative.  Fact Sheet for Patients:   StrictlyIdeas.no  Fact Sheet for Healthcare Providers:   BankingDealers.co.za    This test is not yet approved or cleared by the [place] FDA and  has been authorized for detection and/or diagnosis of SARS-CoV-2 by FDA under an Emergency Use Authorization (EUA).  This EUA will remain in effect (meaning this test  can be used) for the duration of  the COVID-19 declaration under Section 564(b)(1) of the Act, 21 U.S.C. section 360-bbb-3(b)(1), unless the authorization is terminated or revoked sooner.  Performed at [facility], [address] [place], [place] [address]   Culture, blood (Routine X 2) w Reflex to ID Panel     Status: None (Preliminary result)   Collection Time: 03/03/20  7:55 PM   Specimen: BLOOD  Result Value Ref Range Status   Specimen Description BLOOD RIGHT [facility]  Final   Special Requests   Final    BOTTLES DRAWN AEROBIC AND ANAEROBIC Blood Culture adequate volume   Culture   Final    NO GROWTH 2  DAYS Performed at [facility], [address], [place], [place] [address]    Report Status PENDING  Incomplete  Culture, blood (Routine X 2) w Reflex to ID Panel     Status: None (Preliminary result)   Collection Time: 03/03/20  7:56 PM   Specimen: BLOOD  Result Value Ref Range Status   Specimen Description BLOOD LEFT [facility]  Final   Special Requests   Final    BOTTLES DRAWN AEROBIC AND ANAEROBIC Blood Culture adequate volume   Culture   Final    NO GROWTH 2 DAYS Performed at [facility], [address], [place],  [address]    Report Status PENDING  Incomplete     Radiology Studies: DG Chest 2 View  Result Date: 03/03/2020 CLINICAL DATA:  Shortness of breath and cough EXAM: CHEST - 2 VIEW COMPARISON:  September 16, 2018 FINDINGS: There is ill-defined airspace opacity in the left base. There is slight atelectatic change in the mid lung regions bilaterally and right base. Heart size and pulmonary vascularity are normal. No adenopathy. No bone lesions. IMPRESSION: Focal airspace opacity felt to represent focus of pneumonia left base. Subtle atelectasis right base and left mid lung regions in right base. Question early pneumonia in these areas as well. This appearance warrants check of COVID-19 status. Heart size normal.  No adenopathy. Electronically Signed   By: Lowella Grip III M.D.   On: 03/03/2020 09:39   CT Angio Chest PE W and/or Wo Contrast  Result Date: 03/03/2020 CLINICAL DATA:  Dyspnea, fever, COVID pneumonia, EXAM: CT ANGIOGRAPHY CHEST WITH CONTRAST TECHNIQUE: Multidetector CT imaging of the chest was performed using the standard protocol during bolus administration of intravenous contrast. Multiplanar CT image reconstructions and MIPs were obtained to evaluate the vascular anatomy. CONTRAST:  15m OMNIPAQUE IOHEXOL 350 MG/ML SOLN COMPARISON:  None. FINDINGS: Cardiovascular: Satisfactory opacification of the pulmonary arteries to the segmental level. No  evidence of pulmonary embolism. Normal heart size. No pericardial effusion. The thoracic aorta is of normal caliber Mediastinum/Nodes: There is shotty prevascular and bilateral hilar adenopathy present, possibly reactive in nature. No frankly pathologic thoracic adenopathy. The visualized thyroid is unremarkable. Esophagus is unremarkable. Lungs/Pleura: There are extensive bilateral scattered ground-glass pulmonary infiltrates demonstrating a peripheral, subpleural predominance in keeping with changes of atypical infection and compatible with the given history of COVID-19 pneumonia. Lung volumes are small, but are symmetric. No pneumothorax or pleural effusion. Central airways are widely patent. Upper Abdomen: No  acute abnormality. Musculoskeletal: No chest wall abnormality. No acute or significant osseous findings. Review of the MIP images confirms the above findings. IMPRESSION: 1. No evidence of pulmonary embolism. 2. Extensive bilateral scattered ground-glass pulmonary infiltrates demonstrating a peripheral, subpleural predominance in keeping with changes of atypical infection and compatible with the given history of COVID-19 pneumonia. 3. Shotty prevascular and bilateral hilar adenopathy, possibly reactive in nature. Electronically Signed   By: Fidela Salisbury MD   On: 03/03/2020 15:41    Scheduled Meds: . vitamin C  500 mg Oral Daily  . baricitinib  4 mg Oral Daily  . buPROPion  150 mg Oral Daily  . cholecalciferol  5,000 Units Oral Daily  . enoxaparin (LOVENOX) injection  40 mg Subcutaneous Q24H  . fluvoxaMINE  50 mg Oral QHS  . gabapentin  600 mg Oral TID  . ipratropium  2 puff Inhalation Q4H  . methylPREDNISolone (SOLU-MEDROL) injection  40 mg Intravenous Q12H  . norgestimate-ethinyl estradiol  1 tablet Oral Daily  . vitamin B-12  1,000 mcg Oral Daily  . zinc sulfate  220 mg Oral Daily   Continuous Infusions: . remdesivir 100 mg in NS 100 mL Stopped (03/04/20 1041)     LOS: 2 days    Time spent: 30 minutes.  Lorella Nimrod, MD [facility]  If 7PM-7AM, please contact night-coverage [email or web address]  03/05/2020, 8:05 AM   This record has been created using Systems analyst. Errors have been sought and corrected,but may not always be located. Such creation errors do not reflect on the standard of care.

## 2020-03-05 NOTE — Plan of Care (Signed)
  Problem: Education: Goal: Knowledge of risk factors and measures for prevention of condition will improve Outcome: Progressing   

## 2020-03-06 LAB — MAGNESIUM: Magnesium: 2.7 mg/dL — ABNORMAL HIGH (ref 1.7–2.4)

## 2020-03-06 LAB — CBC WITH DIFFERENTIAL/PLATELET
Abs Immature Granulocytes: 0.08 10*3/uL — ABNORMAL HIGH (ref 0.00–0.07)
Basophils Absolute: 0 10*3/uL (ref 0.0–0.1)
Basophils Relative: 0 %
Eosinophils Absolute: 0 10*3/uL (ref 0.0–0.5)
Eosinophils Relative: 0 %
HCT: 38.8 % (ref 36.0–46.0)
Hemoglobin: 13.2 g/dL (ref 12.0–15.0)
Immature Granulocytes: 1 %
Lymphocytes Relative: 8 %
Lymphs Abs: 0.7 10*3/uL (ref 0.7–4.0)
MCH: 28.8 pg (ref 26.0–34.0)
MCHC: 34 g/dL (ref 30.0–36.0)
MCV: 84.5 fL (ref 80.0–100.0)
Monocytes Absolute: 0.5 10*3/uL (ref 0.1–1.0)
Monocytes Relative: 6 %
Neutro Abs: 7.1 10*3/uL (ref 1.7–7.7)
Neutrophils Relative %: 85 %
Platelets: 382 10*3/uL (ref 150–400)
RBC: 4.59 MIL/uL (ref 3.87–5.11)
RDW: 13.2 % (ref 11.5–15.5)
WBC: 8.4 10*3/uL (ref 4.0–10.5)
nRBC: 0 % (ref 0.0–0.2)

## 2020-03-06 LAB — COMPREHENSIVE METABOLIC PANEL
ALT: 78 U/L — ABNORMAL HIGH (ref 0–44)
AST: 103 U/L — ABNORMAL HIGH (ref 15–41)
Albumin: 2.9 g/dL — ABNORMAL LOW (ref 3.5–5.0)
Alkaline Phosphatase: 80 U/L (ref 38–126)
Anion gap: 10 (ref 5–15)
BUN: 23 mg/dL — ABNORMAL HIGH (ref 6–20)
CO2: 30 mmol/L (ref 22–32)
Calcium: 8.4 mg/dL — ABNORMAL LOW (ref 8.9–10.3)
Chloride: 98 mmol/L (ref 98–111)
Creatinine, Ser: 0.86 mg/dL (ref 0.44–1.00)
GFR calc Af Amer: >60 mL/min (ref >60)
GFR calc non Af Amer: >60 mL/min (ref >60)
Glucose, Bld: 146 mg/dL — ABNORMAL HIGH (ref 70–99)
Potassium: 4.2 mmol/L (ref 3.5–5.1)
Sodium: 138 mmol/L (ref 135–145)
Total Bilirubin: 0.8 mg/dL (ref 0.3–1.2)
Total Protein: 6.6 g/dL (ref 6.5–8.1)

## 2020-03-06 LAB — FIBRIN DERIVATIVES D-DIMER (ARMC ONLY): Fibrin derivatives D-dimer (ARMC): 822.53 ng/mL (FEU) — ABNORMAL HIGH (ref 0.00–499.00)

## 2020-03-06 LAB — PROCALCITONIN: Procalcitonin: <0.1 ng/mL

## 2020-03-06 LAB — FERRITIN: Ferritin: 2616 ng/mL — ABNORMAL HIGH (ref 11–307)

## 2020-03-06 LAB — C-REACTIVE PROTEIN: CRP: 3.3 mg/dL — ABNORMAL HIGH (ref <1.0)

## 2020-03-06 NOTE — Progress Notes (Signed)
PROGRESS NOTE    Melissa [name]  AXE:[id number] DOB: [birth year] DOA: 03/03/2020 PCP: Cletis Athens, MD   Brief Narrative: Taken from H&P. Melissa [name] is a 49 y.o. female with medical history significant of hypertension, GERD, depression with anxiety, uterine perforation, bipolar, migraine, fibromyalgia, syncope, IBS, who presents with cough, shortness breath.  Found to be positive for COVID-19.  CTA negative for PE but did show multifocal pneumonia consistent with COVID-19.Unvaccinated. Procalcitonin at 0.52, initially met sepsis criteria.  Blood cultures negative. Started on remdesivir and steroid on 9/13.  Adding baricitinib on 9/14 due to worsening hypoxia.  Subjective: Feelng little better today, less SOB. Saturating 98% on 20 L, able to decrease to 13 without significant change.  Assessment & Plan:   Principal Problem:   Pneumonia due to COVID-19 virus Active Problems:   Bipolar disorder ([place])   Migraine   Acid reflux   HTN (hypertension)   Depression with anxiety   Sepsis ([place])   Acute respiratory failure with hypoxia (HCC)  Acute hypoxic respiratory failure secondary to COVID-19 pneumonia/sepsis secondary to Covid infection.  Currently requiring 13L.  Elevated inflammatory markers, with mild worsening of D-dimer and improved CRP.  CTA negative for PE. procalcitonin at 0.52>>0.16>><0.10.  Most likely secondary to COVID-19 infection is improving without any antibiotics. She was started on remdesivir and steroid, baricitinib added on 03/04/2020 due to worsening hypoxia. Initially meeting sepsis criteria with fever, tachycardia and tachypnea.  Blood cultures negative so far.  No lactic acidosis. -Continue baricitinib-Day 3 -Continue remdesivir and steroid-day 4. -Continue supplemental oxygen to keep the saturation above 90% -Encourage proning. -Continue with supportive care. -Extensive pulmonary toileting. -Continue to monitor inflammatory markers. -Monitor procalcitonin and  leukocytes.  Migraine: Patient received Emgality 3 weeks ago at home -Rizatriptan as needed  Bipolar disorder, depression with anxiety: no SI or HI. -Continue home medications  GERD.  -Continue Pepcid and Protonix.  Essential hypertension.  Blood pressure within goal today. Home dose of verapamil was held due to softer blood pressure on admission. -Continue holding verapamil-can be restarted if needed.  Objective: Vitals:   03/05/20 2320 03/06/20 0000 03/06/20 0318 03/06/20 0753  BP: 122/67  125/69 117/67  Pulse: 86 80 88 83  Resp: _0 Temp: 97.9 F (36.6 C)  98.2 F (36.8 C) 98.6 F (37 C)  TempSrc: Oral  Oral Oral  SpO2: 97%  92% 94%  Weight:      Height:        Intake/Output Summary (Last 24 hours) at 03/06/2020 0854 Last data filed at 03/05/2020 2100 Gross per 24 hour  Intake 220 ml  Output --  Net 220 ml   Filed Weights   03/03/20 0904 03/03/20 2318  Weight: 70.3 kg 70.3 kg    Examination:  General.  Well-developed lady, lying in bed comfortably, in no acute distress. Pulmonary.  Lungs clear bilaterally, normal respiratory effort. CV.  Regular rate and rhythm, no JVD, rub or murmur. Abdomen.  Soft, nontender, nondistended, BS positive. CNS.  Alert and oriented x3.  No focal neurologic deficit. Extremities.  No edema, no cyanosis, pulses intact and symmetrical. Psychiatry.  Judgment and insight appears normal.  DVT prophylaxis: Lovenox Code Status: Full Family Communication: Husband was updated on phone. Disposition Plan:  Status is: Inpatient  Remains inpatient appropriate because:Inpatient level of care appropriate due to severity of illness   Dispo: The patient is from: Home              Anticipated  d/c is to: Home              Anticipated d/c date is: 3 days              Patient currently is not medically stable to d/c.   Consultants:   None  Procedures:  Antimicrobials:   Data Reviewed: I have personally reviewed following  labs and imaging studies  CBC: Recent Labs  Lab 03/03/20 0914 03/04/20 0556 03/05/20 0525 03/06/20 0506  WBC 7.9 6.2 7.6 8.4  NEUTROABS  --  5.5 6.1 7.1  HGB 13.7 12.5 12.3 13.2  HCT 40.8 37.5 36.9 38.8  MCV 85.0 86.0 86.8 84.5  PLT 230 260 333 253   Basic Metabolic Panel: Recent Labs  Lab 03/03/20 0914 03/03/20 1304 03/04/20 0556 03/05/20 0525 03/06/20 0506  NA 137  --  138 139 138  K 3.9  --  4.0 3.7 4.2  CL 97*  --  99 98 98  CO2 28  --  _0 GLUCOSE 121*  --  134* 144* 146*  BUN 12  --  14 21* 23*  CREATININE 0.99  --  0.93 0.89 0.86  CALCIUM 9.1  --  8.5* 8.3* 8.4*  MG  --  1.9 2.3 2.6* 2.7*   GFR: Estimated Creatinine Clearance: 81.8 mL/min (by C-G formula based on SCr of 0.86 mg/dL). Liver Function Tests: Recent Labs  Lab 03/03/20 1304 03/04/20 0556 03/05/20 0525 03/06/20 0506  AST 104* 117* 87* 103*  ALT 51* 61* 59* 78*  ALKPHOS 74 90 82 80  BILITOT 1.3* 0.9 0.6 0.8  PROT 6.8 6.8 6.4* 6.6  ALBUMIN 2.9* 2.9* 2.7* 2.9*   No results for input(s): LIPASE, AMYLASE in the last 168 hours. No results for input(s): AMMONIA in the last 168 hours. Coagulation Profile: No results for input(s): INR, PROTIME in the last 168 hours. Cardiac Enzymes: No results for input(s): CKTOTAL, CKMB, CKMBINDEX, TROPONINI in the last 168 hours. BNP (last 3 results) No results for input(s): PROBNP in the last 8760 hours. HbA1C: No results for input(s): HGBA1C in the last 72 hours. CBG: No results for input(s): GLUCAP in the last 168 hours. Lipid Profile: Recent Labs    03/03/20 1304  TRIG 330*   Thyroid Function Tests: No results for input(s): TSH, T4TOTAL, FREET4, T3FREE, THYROIDAB in the last 72 hours. Anemia Panel: Recent Labs    03/05/20 0525 03/06/20 0506  FERRITIN 3,033* 2,616*   Sepsis Labs: Recent Labs  Lab 03/03/20 1304 03/03/20 1956 03/04/20 0556 03/05/20 0525 03/06/20 0506  PROCALCITON 0.52  --  0.27 0.16 <0.10  LATICACIDVEN  --  1.5  --    --   --     Recent Results (from the past 240 hour(s))  SARS Coronavirus 2 by RT PCR (hospital order, performed in [facility] hospital lab) Nasopharyngeal Nasopharyngeal Swab     Status: Abnormal   Collection Time: 03/03/20  1:04 PM   Specimen: Nasopharyngeal Swab  Result Value Ref Range Status   SARS Coronavirus 2 POSITIVE (A) NEGATIVE Final    Comment: RESULT CALLED TO, READ BACK BY AND VERIFIED WITH:  Gray AT [id number] 03/03/20 SDR (NOTE) SARS-CoV-2 target nucleic acids are DETECTED  SARS-CoV-2 RNA is generally detectable in upper respiratory specimens  during the acute phase of infection.  Positive results are indicative  of the presence of the identified virus, but do not rule out bacterial infection or co-infection with other pathogens not detected by the test.  Clinical correlation with patient history and  other diagnostic information is necessary to determine patient infection status.  The expected result is negative.  Fact Sheet for Patients:   StrictlyIdeas.no   Fact Sheet for Healthcare Providers:   BankingDealers.co.za    This test is not yet approved or cleared by the [place] FDA and  has been authorized for detection and/or diagnosis of SARS-CoV-2 by FDA under an Emergency Use Authorization (EUA).  This EUA will remain in effect (meaning this test  can be used) for the duration of  the COVID-19 declaration under Section 564(b)(1) of the Act, 21 U.S.C. section 360-bbb-3(b)(1), unless the authorization is terminated or revoked sooner.  Performed at [facility], [address] [place], [place] [address]   Culture, blood (Routine X 2) w Reflex to ID Panel     Status: None (Preliminary result)   Collection Time: 03/03/20  7:55 PM   Specimen: BLOOD  Result Value Ref Range Status   Specimen Description BLOOD RIGHT [facility]  Final   Special Requests   Final    BOTTLES DRAWN AEROBIC AND ANAEROBIC Blood  Culture adequate volume   Culture   Final    NO GROWTH 3 DAYS Performed at [facility], [address] [place], [place] [address]    Report Status PENDING  Incomplete  Culture, blood (Routine X 2) w Reflex to ID Panel     Status: None (Preliminary result)   Collection Time: 03/03/20  7:56 PM   Specimen: BLOOD  Result Value Ref Range Status   Specimen Description BLOOD LEFT [facility]  Final   Special Requests   Final    BOTTLES DRAWN AEROBIC AND ANAEROBIC Blood Culture adequate volume   Culture   Final    NO GROWTH 3 DAYS Performed at [facility], [address], [place], [place] [address]    Report Status PENDING  Incomplete     Radiology Studies: No results found.  Scheduled Meds:  vitamin C  500 mg Oral Daily   baricitinib  4 mg Oral Daily   buPROPion  150 mg Oral Daily   cholecalciferol  5,000 Units Oral Daily   enoxaparin (LOVENOX) injection  40 mg Subcutaneous Q24H   fluvoxaMINE  50 mg Oral QHS   gabapentin  600 mg Oral TID   ipratropium  2 puff Inhalation Q4H   methylPREDNISolone (SOLU-MEDROL) injection  40 mg Intravenous Q12H   norgestimate-ethinyl estradiol  1 tablet Oral Daily   pantoprazole  40 mg Oral Daily   vitamin B-12  1,000 mcg Oral Daily   zinc sulfate  220 mg Oral Daily   Continuous Infusions:  remdesivir 100 mg in NS 100 mL Stopped (03/05/20 1002)     LOS: 3 days   Time spent: 30 minutes.  Lorella Nimrod, MD [facility]  If 7PM-7AM, please contact night-coverage [email or web address]  03/06/2020, 8:54 AM   This record has been created using Systems analyst. Errors have been sought and corrected,but may not always be located. Such creation errors do not reflect on the standard of care.

## 2020-03-07 LAB — CBC WITH DIFFERENTIAL/PLATELET
Abs Immature Granulocytes: 0.14 10*3/uL — ABNORMAL HIGH (ref 0.00–0.07)
Basophils Absolute: 0 10*3/uL (ref 0.0–0.1)
Basophils Relative: 0 %
Eosinophils Absolute: 0 10*3/uL (ref 0.0–0.5)
Eosinophils Relative: 0 %
HCT: 37 % (ref 36.0–46.0)
Hemoglobin: 12.5 g/dL (ref 12.0–15.0)
Immature Granulocytes: 1 %
Lymphocytes Relative: 6 %
Lymphs Abs: 0.6 10*3/uL — ABNORMAL LOW (ref 0.7–4.0)
MCH: 28.8 pg (ref 26.0–34.0)
MCHC: 33.8 g/dL (ref 30.0–36.0)
MCV: 85.3 fL (ref 80.0–100.0)
Monocytes Absolute: 0.5 10*3/uL (ref 0.1–1.0)
Monocytes Relative: 5 %
Neutro Abs: 9.2 10*3/uL — ABNORMAL HIGH (ref 1.7–7.7)
Neutrophils Relative %: 88 %
Platelets: 441 10*3/uL — ABNORMAL HIGH (ref 150–400)
RBC: 4.34 MIL/uL (ref 3.87–5.11)
RDW: 12.9 % (ref 11.5–15.5)
WBC: 10.4 10*3/uL (ref 4.0–10.5)
nRBC: 0 % (ref 0.0–0.2)

## 2020-03-07 LAB — COMPREHENSIVE METABOLIC PANEL
ALT: 75 U/L — ABNORMAL HIGH (ref 0–44)
AST: 62 U/L — ABNORMAL HIGH (ref 15–41)
Albumin: 2.7 g/dL — ABNORMAL LOW (ref 3.5–5.0)
Alkaline Phosphatase: 68 U/L (ref 38–126)
Anion gap: 9 (ref 5–15)
BUN: 20 mg/dL (ref 6–20)
CO2: 29 mmol/L (ref 22–32)
Calcium: 8.1 mg/dL — ABNORMAL LOW (ref 8.9–10.3)
Chloride: 100 mmol/L (ref 98–111)
Creatinine, Ser: 0.69 mg/dL (ref 0.44–1.00)
GFR calc Af Amer: >60 mL/min (ref >60)
GFR calc non Af Amer: >60 mL/min (ref >60)
Glucose, Bld: 135 mg/dL — ABNORMAL HIGH (ref 70–99)
Potassium: 4.3 mmol/L (ref 3.5–5.1)
Sodium: 138 mmol/L (ref 135–145)
Total Bilirubin: 0.8 mg/dL (ref 0.3–1.2)
Total Protein: 6.2 g/dL — ABNORMAL LOW (ref 6.5–8.1)

## 2020-03-07 LAB — MAGNESIUM: Magnesium: 2.8 mg/dL — ABNORMAL HIGH (ref 1.7–2.4)

## 2020-03-07 LAB — FERRITIN: Ferritin: 1301 ng/mL — ABNORMAL HIGH (ref 11–307)

## 2020-03-07 LAB — FIBRIN DERIVATIVES D-DIMER (ARMC ONLY): Fibrin derivatives D-dimer (ARMC): 929.83 ng/mL (FEU) — ABNORMAL HIGH (ref 0.00–499.00)

## 2020-03-07 LAB — C-REACTIVE PROTEIN: CRP: 1.6 mg/dL — ABNORMAL HIGH (ref <1.0)

## 2020-03-07 MED ORDER — METHYLPREDNISOLONE SODIUM SUCC 40 MG IJ SOLR
40.0000 mg | Freq: Every day | INTRAMUSCULAR | Status: DC
  Administered 2020-03-08: 10:00:00 40 mg via INTRAVENOUS
  Filled 2020-03-07: qty 1

## 2020-03-07 NOTE — Plan of Care (Signed)
  Problem: Education: Goal: Knowledge of risk factors and measures for prevention of condition will improve Outcome: Progressing   Problem: Coping: Goal: Psychosocial and spiritual needs will be supported Outcome: Progressing   Problem: Respiratory: Goal: Will maintain a patent airway Outcome: Progressing Goal: Complications related to the disease process, condition or treatment will be avoided or minimized Outcome: Progressing   

## 2020-03-07 NOTE — Progress Notes (Signed)
PROGRESS NOTE    Melissa [name]  DIY:[id number] DOB: [birth year] DOA: 03/03/2020 PCP: Cletis Athens, MD   Brief Narrative: Taken from H&P. Melissa [name] is a 49 y.o. female with medical history significant of hypertension, GERD, depression with anxiety, uterine perforation, bipolar, migraine, fibromyalgia, syncope, IBS, who presents with cough, shortness breath.  Found to be positive for COVID-19.  CTA negative for PE but did show multifocal pneumonia consistent with COVID-19.Unvaccinated. Procalcitonin at 0.52, initially met sepsis criteria.  Blood cultures negative. Started on remdesivir and steroid on 9/13.  Adding baricitinib on 9/14 due to worsening hypoxia.  Subjective: Pt reported feeling much better today.  Had some coughing episodes with clear sputum production.  Had been sitting up at side of bed.   Assessment & Plan:   Principal Problem:   Pneumonia due to COVID-19 virus Active Problems:   Bipolar disorder ([place])   Migraine   Acid reflux   HTN (hypertension)   Depression with anxiety   Sepsis ([place])   Acute respiratory failure with hypoxia (HCC)  Acute hypoxic respiratory failure secondary to COVID-19 pneumonia sepsis secondary to Covid infection.  Currently requiring 13L.  Elevated inflammatory markers, with mild worsening of D-dimer and improved CRP.  CTA negative for PE. procalcitonin at 0.52>>0.16>><0.10.  Most likely secondary to COVID-19 infection is improving without any antibiotics. She was started on remdesivir and steroid, baricitinib added on 03/04/2020 due to worsening hypoxia. Initially meeting sepsis criteria with fever, tachycardia and tachypnea.  Blood cultures negative so far.  No lactic acidosis. PLAN: -Continue baricitinib -Continue remdesivir  -taper steroid to solumedrol 40 mg daily -Continue supplemental oxygen to keep the saturation above 90%, wean as able -Encourage proning. -Extensive pulmonary toileting. -Continue to monitor inflammatory  markers. --Atrovent q4h  Migraine:  Patient received Emgality 3 weeks ago at home.  Takes home Rizatriptan as needed --currently stable with no headache --Imitrex PRN   Bipolar disorder, depression with anxiety:  no SI or HI. -Continue home bupropion, Luvox, klonopin PRN  GERD.  -continue PPI  Essential hypertension.   Home dose of verapamil was held due to softer blood pressure on admission. BP 100's-120's. --continue to hold home verapamil    Objective: Vitals:   03/06/20 1205 03/06/20 1656 03/07/20 0552 03/07/20 0812  BP: 113/65 124/64 109/71 109/71  Pulse: 84 80 76 86  Resp: '16 15  20  ' Temp: 98.6 F (37 C) 98.6 F (37 C) 98 F (36.7 C) 97.9 F (36.6 C)  TempSrc: Oral Oral Oral Oral  SpO2: 96% 96% 94% 91%  Weight:      Height:        Intake/Output Summary (Last 24 hours) at 03/07/2020 1511 Last data filed at 03/06/2020 2036 Gross per 24 hour  Intake 160 ml  Output --  Net 160 ml   Filed Weights   03/03/20 0904 03/03/20 2318  Weight: 70.3 kg 70.3 kg    Examination:  Constitutional: NAD, AAOx3 HEENT: conjunctivae and lids normal, EOMI CV: RRR no M,R,G. Distal pulses +2.  No cyanosis.   RESP: CTA B/L, normal respiratory effort, on 12L GI: +BS, NTND Extremities: No effusions, edema in BLE SKIN: warm, dry and intact Neuro: II - XII grossly intact.  Sensation intact Psych: Normal mood and affect.  Appropriate judgement and reason    DVT prophylaxis: Lovenox SQ Code Status: Full code  Family Communication:  Status is: inpatient Dispo:   The patient is from: home Anticipated d/c is to: home Anticipated d/c date is: unclear  Patient currently is not medically stable to d/c due to: currently still on 12L High flow    Consultants:   None  Procedures:  Antimicrobials:   Data Reviewed: I have personally reviewed following labs and imaging studies  CBC: Recent Labs  Lab 03/03/20 0914 03/04/20 0556 03/05/20 0525 03/06/20 0506  03/07/20 0537  WBC 7.9 6.2 7.6 8.4 10.4  NEUTROABS  --  5.5 6.1 7.1 9.2*  HGB 13.7 12.5 12.3 13.2 12.5  HCT 40.8 37.5 36.9 38.8 37.0  MCV 85.0 86.0 86.8 84.5 85.3  PLT 230 260 333 382 850*   Basic Metabolic Panel: Recent Labs  Lab 03/03/20 0914 03/03/20 1304 03/04/20 0556 03/05/20 0525 03/06/20 0506 03/07/20 0537  NA 137  --  138 139 138 138  K 3.9  --  4.0 3.7 4.2 4.3  CL 97*  --  99 98 98 100  CO2 28  --  '28 30 30 29  ' GLUCOSE 121*  --  134* 144* 146* 135*  BUN 12  --  14 21* 23* 20  CREATININE 0.99  --  0.93 0.89 0.86 0.69  CALCIUM 9.1  --  8.5* 8.3* 8.4* 8.1*  MG  --  1.9 2.3 2.6* 2.7* 2.8*   GFR: Estimated Creatinine Clearance: 87.9 mL/min (by C-G formula based on SCr of 0.69 mg/dL). Liver Function Tests: Recent Labs  Lab 03/03/20 1304 03/04/20 0556 03/05/20 0525 03/06/20 0506 03/07/20 0537  AST 104* 117* 87* 103* 62*  ALT 51* 61* 59* 78* 75*  ALKPHOS 74 90 82 80 68  BILITOT 1.3* 0.9 0.6 0.8 0.8  PROT 6.8 6.8 6.4* 6.6 6.2*  ALBUMIN 2.9* 2.9* 2.7* 2.9* 2.7*   No results for input(s): LIPASE, AMYLASE in the last 168 hours. No results for input(s): AMMONIA in the last 168 hours. Coagulation Profile: No results for input(s): INR, PROTIME in the last 168 hours. Cardiac Enzymes: No results for input(s): CKTOTAL, CKMB, CKMBINDEX, TROPONINI in the last 168 hours. BNP (last 3 results) No results for input(s): PROBNP in the last 8760 hours. HbA1C: No results for input(s): HGBA1C in the last 72 hours. CBG: No results for input(s): GLUCAP in the last 168 hours. Lipid Profile: No results for input(s): CHOL, HDL, LDLCALC, TRIG, CHOLHDL, LDLDIRECT in the last 72 hours. Thyroid Function Tests: No results for input(s): TSH, T4TOTAL, FREET4, T3FREE, THYROIDAB in the last 72 hours. Anemia Panel: Recent Labs    03/06/20 0506 03/07/20 0537  FERRITIN 2,616* 1,301*   Sepsis Labs: Recent Labs  Lab 03/03/20 1304 03/03/20 1956 03/04/20 0556 03/05/20 0525  03/06/20 0506  PROCALCITON 0.52  --  0.27 0.16 <0.10  LATICACIDVEN  --  1.5  --   --   --     Recent Results (from the past 240 hour(s))  SARS Coronavirus 2 by RT PCR (hospital order, performed in [place] hospital lab) Nasopharyngeal Nasopharyngeal Swab     Status: Abnormal   Collection Time: 03/03/20  1:04 PM   Specimen: Nasopharyngeal Swab  Result Value Ref Range Status   SARS Coronavirus 2 POSITIVE (A) NEGATIVE Final    Comment: RESULT CALLED TO, READ BACK BY AND VERIFIED WITH:  Dixon AT 2774 03/03/20 SDR (NOTE) SARS-CoV-2 target nucleic acids are DETECTED  SARS-CoV-2 RNA is generally detectable in upper respiratory specimens  during the acute phase of infection.  Positive results are indicative  of the presence of the identified virus, but do not rule out bacterial infection or co-infection with other pathogens not detected  by the test.  Clinical correlation with patient history and  other diagnostic information is necessary to determine patient infection status.  The expected result is negative.  Fact Sheet for Patients:   StrictlyIdeas.no   Fact Sheet for Healthcare Providers:   BankingDealers.co.za    This test is not yet approved or cleared by the [place] FDA and  has been authorized for detection and/or diagnosis of SARS-CoV-2 by FDA under an Emergency Use Authorization (EUA).  This EUA will remain in effect (meaning this test  can be used) for the duration of  the COVID-19 declaration under Section 564(b)(1) of the Act, 21 U.S.C. section 360-bbb-3(b)(1), unless the authorization is terminated or revoked sooner.  Performed at [facility], [address] [place], [place] [address]   Culture, blood (Routine X 2) w Reflex to ID Panel     Status: None (Preliminary result)   Collection Time: 03/03/20  7:55 PM   Specimen: BLOOD  Result Value Ref Range Status   Specimen Description BLOOD RIGHT [facility]   Final   Special Requests   Final    BOTTLES DRAWN AEROBIC AND ANAEROBIC Blood Culture adequate volume   Culture   Final    NO GROWTH 4 DAYS Performed at [facility], [address] [place], [place] [address]    Report Status PENDING  Incomplete  Culture, blood (Routine X 2) w Reflex to ID Panel     Status: None (Preliminary result)   Collection Time: 03/03/20  7:56 PM   Specimen: BLOOD  Result Value Ref Range Status   Specimen Description BLOOD LEFT [facility]  Final   Special Requests   Final    BOTTLES DRAWN AEROBIC AND ANAEROBIC Blood Culture adequate volume   Culture   Final    NO GROWTH 4 DAYS Performed at [facility], [address], [place], [place] [address]    Report Status PENDING  Incomplete     Radiology Studies: No results found.  Scheduled Meds: . vitamin C  500 mg Oral Daily  . baricitinib  4 mg Oral Daily  . buPROPion  150 mg Oral Daily  . cholecalciferol  5,000 Units Oral Daily  . enoxaparin (LOVENOX) injection  40 mg Subcutaneous Q24H  . fluvoxaMINE  50 mg Oral QHS  . gabapentin  600 mg Oral TID  . ipratropium  2 puff Inhalation Q4H  . [START ON 03/08/2020] methylPREDNISolone (SOLU-MEDROL) injection  40 mg Intravenous Daily  . pantoprazole  40 mg Oral Daily  . vitamin B-12  1,000 mcg Oral Daily  . zinc sulfate  220 mg Oral Daily   Continuous Infusions:    LOS: 4 days    Enzo Bi, MD [facility]  If 7PM-7AM, please contact night-coverage [email or web address]  03/07/2020, 3:11 PM

## 2020-03-07 NOTE — Care Management Important Message (Signed)
Important Message  Patient Details  Name: Melissa [name] MRN: [id number] Date of Birth: Jun 16, 1971   Medicare Important Message Given:  Other (see comment)  Attempted twice to reach patient via room phone this afternoon to review Medicare IM, however no answer during both attempts.     Dannette Barbara 03/07/2020, 2:59 PM

## 2020-03-08 LAB — MAGNESIUM: Magnesium: 2.4 mg/dL (ref 1.7–2.4)

## 2020-03-08 LAB — CBC
HCT: 36.5 % (ref 36.0–46.0)
Hemoglobin: 12.4 g/dL (ref 12.0–15.0)
MCH: 29 pg (ref 26.0–34.0)
MCHC: 34 g/dL (ref 30.0–36.0)
MCV: 85.3 fL (ref 80.0–100.0)
Platelets: 472 10*3/uL — ABNORMAL HIGH (ref 150–400)
RBC: 4.28 MIL/uL (ref 3.87–5.11)
RDW: 13.2 % (ref 11.5–15.5)
WBC: 9.6 10*3/uL (ref 4.0–10.5)
nRBC: 0 % (ref 0.0–0.2)

## 2020-03-08 LAB — BASIC METABOLIC PANEL
Anion gap: 9 (ref 5–15)
BUN: 17 mg/dL (ref 6–20)
CO2: 26 mmol/L (ref 22–32)
Calcium: 7.9 mg/dL — ABNORMAL LOW (ref 8.9–10.3)
Chloride: 100 mmol/L (ref 98–111)
Creatinine, Ser: 0.87 mg/dL (ref 0.44–1.00)
GFR calc Af Amer: >60 mL/min (ref >60)
GFR calc non Af Amer: >60 mL/min (ref >60)
Glucose, Bld: 95 mg/dL (ref 70–99)
Potassium: 3.9 mmol/L (ref 3.5–5.1)
Sodium: 135 mmol/L (ref 135–145)

## 2020-03-08 LAB — CULTURE, BLOOD (ROUTINE X 2)
Culture: NO GROWTH
Culture: NO GROWTH
Special Requests: ADEQUATE
Special Requests: ADEQUATE

## 2020-03-08 LAB — GLUCOSE, CAPILLARY: Glucose-Capillary: 124 mg/dL — ABNORMAL HIGH (ref 70–99)

## 2020-03-08 LAB — C-REACTIVE PROTEIN: CRP: 5.7 mg/dL — ABNORMAL HIGH (ref <1.0)

## 2020-03-08 MED ORDER — IPRATROPIUM BROMIDE HFA 17 MCG/ACT IN AERS
2.0000 | INHALATION_SPRAY | Freq: Four times a day (QID) | RESPIRATORY_TRACT | Status: DC
  Administered 2020-03-08 – 2020-03-12 (×15): 2 via RESPIRATORY_TRACT
  Filled 2020-03-08: qty 12.9

## 2020-03-08 MED ORDER — METHYLPREDNISOLONE SODIUM SUCC 40 MG IJ SOLR
40.0000 mg | Freq: Two times a day (BID) | INTRAMUSCULAR | Status: DC
  Administered 2020-03-08 – 2020-03-11 (×6): 40 mg via INTRAVENOUS
  Filled 2020-03-08 (×6): qty 1

## 2020-03-08 NOTE — Plan of Care (Signed)
  Problem: Education: Goal: Knowledge of risk factors and measures for prevention of condition will improve Outcome: Progressing   Problem: Coping: Goal: Psychosocial and spiritual needs will be supported Outcome: Progressing   Problem: Respiratory: Goal: Will maintain a patent airway Outcome: Progressing Goal: Complications related to the disease process, condition or treatment will be avoided or minimized Outcome: Progressing   

## 2020-03-08 NOTE — Progress Notes (Addendum)
At [id number] this writer made aware of MEWS score 3, VS taken at 0907.  This Probation officer rechecked VS and assessed patient at 56.  Temp 101.6, HR 117, BP 98/49, Resp 19, O2 sats 94% on 9L O2 per HFNC.  Pt is awake and oriented x4.  No acute distress noted andf pt denies pain.  MEWS score 5 and escalated.  Tylenol given.  Dr. Billie Ruddy made aware of the above.  No new orders at this time.  Will continue to monitor.    Mews score 4.  Oral temp 102.5. Cool washcloth applied to forehead.  Pt in no acute distress.   Oral temp 99.6.  Mews score 2.  Mews score 0. Oral temp 98.7.

## 2020-03-08 NOTE — Progress Notes (Signed)
PROGRESS NOTE    BRITINEY BLAHNIK  WSF:[id number] DOB: 30-Aug-1970 DOA: 03/03/2020 PCP: Cletis Athens, MD   Brief Narrative: Taken from H&P. Melissa [name] is a 49 y.o. female with medical history significant of hypertension, GERD, depression with anxiety, uterine perforation, bipolar, migraine, fibromyalgia, syncope, IBS, who presents with cough, shortness breath.  Found to be positive for COVID-19.  CTA negative for PE but did show multifocal pneumonia consistent with COVID-19.Unvaccinated. Procalcitonin at 0.52, initially met sepsis criteria.  Blood cultures negative. Started on remdesivir and steroid on 9/13.  Adding baricitinib on 9/14 due to worsening hypoxia.  Subjective: Pt developed a fever this morning 102.5.  No dysuria, diarrhea, abdominal pain, or other new symptoms.     Assessment & Plan:   Principal Problem:   Pneumonia due to COVID-19 virus Active Problems:   Bipolar disorder ([place])   Migraine   Acid reflux   HTN (hypertension)   Depression with anxiety   Sepsis ([place])   Acute respiratory failure with hypoxia (HCC)  Acute hypoxic respiratory failure secondary to COVID-19 pneumonia sepsis secondary to Covid infection.   O2 requirement up to 13L.  Elevated inflammatory markers, with mild worsening of D-dimer and improved CRP.  CTA negative for PE. procalcitonin at 0.52>>0.16>><0.10.  Most likely secondary to COVID-19 infection is improving without any antibiotics. She was started on remdesivir and steroid, baricitinib added on 03/04/2020 due to worsening hypoxia. Initially meeting sepsis criteria with fever, tachycardia and tachypnea.  Blood cultures negative so far.  No lactic acidosis. --completed Remdesivir. PLAN: -Continue baricitinib -increase steroid back up to solumedrol 40 mg BID due to increasing CRP today -Continue supplemental oxygen to keep the saturation above 90%, wean as able -Encourage proning. -Extensive pulmonary toileting. -Continue to monitor  inflammatory markers. --Atrovent QID  Persistent fever --pt developed a high fever to 102.5 this morning, after 5 days of inpatient and receiving COVID treatment. --No signs and symptoms of other source of infection --continue treatment for COVID infection  Migraine:  Patient received Emgality 3 weeks ago at home.  Takes home Rizatriptan as needed --currently stable with no headache --Imitrex PRN   Bipolar disorder, depression with anxiety:  no SI or HI. -Continue home bupropion, Luvox, klonopin PRN  GERD.  -continue PPI  Essential hypertension.   Home dose of verapamil was held due to softer blood pressure on admission. BP 90's-100's --continue to hold home verapamil    Objective: Vitals:   03/08/20 1231 03/08/20 1244 03/08/20 1318 03/08/20 1409  BP:  (!) 98/55  (!) 95/55  Pulse:  97  95  Resp:  18  18  Temp:  98 F (36.7 C)  98.6 F (37 C)  TempSrc:  Oral  Oral  SpO2: 96% 94% 97% 91%  Weight:      Height:        Intake/Output Summary (Last 24 hours) at 03/08/2020 1546 Last data filed at 03/08/2020 1019 Gross per 24 hour  Intake 240 ml  Output --  Net 240 ml   Filed Weights   03/03/20 0904 03/03/20 2318  Weight: 70.3 kg 70.3 kg    Examination:  Constitutional: NAD, AAOx3 HEENT: conjunctivae and lids normal, EOMI CV: RRR no M,R,G. Distal pulses +2.  No cyanosis.   RESP: CTA B/L, on 9L GI: +BS, NTND Extremities: No effusions, edema in BLE SKIN: warm, dry and intact Neuro: II - XII grossly intact.  Sensation intact Psych: Normal mood and affect.  Appropriate judgement and reason   DVT prophylaxis:  Lovenox SQ Code Status: Full code  Family Communication:  Status is: inpatient Dispo:   The patient is from: home Anticipated d/c is to: home Anticipated d/c date is: unclear Patient currently is not medically stable to d/c due to: currently still on 11L High flow, still spiking fever.    Consultants:   None  Procedures:  Antimicrobials:    Data Reviewed: I have personally reviewed following labs and imaging studies  CBC: Recent Labs  Lab 03/04/20 0556 03/05/20 0525 03/06/20 0506 03/07/20 0537 03/08/20 0702  WBC 6.2 7.6 8.4 10.4 9.6  NEUTROABS 5.5 6.1 7.1 9.2*  --   HGB 12.5 12.3 13.2 12.5 12.4  HCT 37.5 36.9 38.8 37.0 36.5  MCV 86.0 86.8 84.5 85.3 85.3  PLT 260 333 382 441* 944*   Basic Metabolic Panel: Recent Labs  Lab 03/04/20 0556 03/05/20 0525 03/06/20 0506 03/07/20 0537 03/08/20 0702  NA 138 139 138 138 135  K 4.0 3.7 4.2 4.3 3.9  CL 99 98 98 100 100  CO2 _0 GLUCOSE 134* 144* 146* 135* 95  BUN 14 21* 23* 20 17  CREATININE 0.93 0.89 0.86 0.69 0.87  CALCIUM 8.5* 8.3* 8.4* 8.1* 7.9*  MG 2.3 2.6* 2.7* 2.8* 2.4   GFR: Estimated Creatinine Clearance: 80.8 mL/min (by C-G formula based on SCr of 0.87 mg/dL). Liver Function Tests: Recent Labs  Lab 03/03/20 1304 03/04/20 0556 03/05/20 0525 03/06/20 0506 03/07/20 0537  AST 104* 117* 87* 103* 62*  ALT 51* 61* 59* 78* 75*  ALKPHOS 74 90 82 80 68  BILITOT 1.3* 0.9 0.6 0.8 0.8  PROT 6.8 6.8 6.4* 6.6 6.2*  ALBUMIN 2.9* 2.9* 2.7* 2.9* 2.7*   No results for input(s): LIPASE, AMYLASE in the last 168 hours. No results for input(s): AMMONIA in the last 168 hours. Coagulation Profile: No results for input(s): INR, PROTIME in the last 168 hours. Cardiac Enzymes: No results for input(s): CKTOTAL, CKMB, CKMBINDEX, TROPONINI in the last 168 hours. BNP (last 3 results) No results for input(s): PROBNP in the last 8760 hours. HbA1C: No results for input(s): HGBA1C in the last 72 hours. CBG: Recent Labs  Lab 03/08/20 1159  GLUCAP 124*   Lipid Profile: No results for input(s): CHOL, HDL, LDLCALC, TRIG, CHOLHDL, LDLDIRECT in the last 72 hours. Thyroid Function Tests: No results for input(s): TSH, T4TOTAL, FREET4, T3FREE, THYROIDAB in the last 72 hours. Anemia Panel: Recent Labs    03/06/20 0506 03/07/20 0537  FERRITIN 2,616* 1,301*    Sepsis Labs: Recent Labs  Lab 03/03/20 1304 03/03/20 1956 03/04/20 0556 03/05/20 0525 03/06/20 0506  PROCALCITON 0.52  --  0.27 0.16 <0.10  LATICACIDVEN  --  1.5  --   --   --     Recent Results (from the past 240 hour(s))  SARS Coronavirus 2 by RT PCR (hospital order, performed in [place] hospital lab) Nasopharyngeal Nasopharyngeal Swab     Status: Abnormal   Collection Time: 03/03/20  1:04 PM   Specimen: Nasopharyngeal Swab  Result Value Ref Range Status   SARS Coronavirus 2 POSITIVE (A) NEGATIVE Final    Comment: RESULT CALLED TO, READ BACK BY AND VERIFIED WITH:  [place] AT [id number] 03/03/20 SDR (NOTE) SARS-CoV-2 target nucleic acids are DETECTED  SARS-CoV-2 RNA is generally detectable in upper respiratory specimens  during the acute phase of infection.  Positive results are indicative  of the presence of the identified virus, but do not rule out bacterial infection or  co-infection with other pathogens not detected by the test.  Clinical correlation with patient history and  other diagnostic information is necessary to determine patient infection status.  The expected result is negative.  Fact Sheet for Patients:   StrictlyIdeas.no   Fact Sheet for Healthcare Providers:   BankingDealers.co.za    This test is not yet approved or cleared by the [place] FDA and  has been authorized for detection and/or diagnosis of SARS-CoV-2 by FDA under an Emergency Use Authorization (EUA).  This EUA will remain in effect (meaning this test  can be used) for the duration of  the COVID-19 declaration under Section 564(b)(1) of the Act, 21 U.S.C. section 360-bbb-3(b)(1), unless the authorization is terminated or revoked sooner.  Performed at [facility], [address] [place], [place] [address]   Culture, blood (Routine X 2) w Reflex to ID Panel     Status: None   Collection Time: 03/03/20  7:55 PM   Specimen:  BLOOD  Result Value Ref Range Status   Specimen Description BLOOD RIGHT [facility]  Final   Special Requests   Final    BOTTLES DRAWN AEROBIC AND ANAEROBIC Blood Culture adequate volume   Culture   Final    NO GROWTH 5 DAYS Performed at [facility], [address], [place], [place] [address]    Report Status 03/08/2020 FINAL  Final  Culture, blood (Routine X 2) w Reflex to ID Panel     Status: None   Collection Time: 03/03/20  7:56 PM   Specimen: BLOOD  Result Value Ref Range Status   Specimen Description BLOOD LEFT [facility]  Final   Special Requests   Final    BOTTLES DRAWN AEROBIC AND ANAEROBIC Blood Culture adequate volume   Culture   Final    NO GROWTH 5 DAYS Performed at [facility], [address], [place], [place] [address]    Report Status 03/08/2020 FINAL  Final     Radiology Studies: No results found.  Scheduled Meds: . vitamin C  500 mg Oral Daily  . baricitinib  4 mg Oral Daily  . buPROPion  150 mg Oral Daily  . cholecalciferol  5,000 Units Oral Daily  . enoxaparin (LOVENOX) injection  40 mg Subcutaneous Q24H  . fluvoxaMINE  50 mg Oral QHS  . gabapentin  600 mg Oral TID  . ipratropium  2 puff Inhalation Q4H  . methylPREDNISolone (SOLU-MEDROL) injection  40 mg Intravenous Daily  . pantoprazole  40 mg Oral Daily  . vitamin B-12  1,000 mcg Oral Daily  . zinc sulfate  220 mg Oral Daily   Continuous Infusions:    LOS: 5 days    Enzo Bi, MD [facility]  If 7PM-7AM, please contact night-coverage [email or web address]  03/08/2020, 3:46 PM

## 2020-03-09 DIAGNOSIS — D75839 Thrombocytosis, unspecified: Secondary | ICD-10-CM | POA: Diagnosis not present

## 2020-03-09 DIAGNOSIS — R739 Hyperglycemia, unspecified: Secondary | ICD-10-CM | POA: Diagnosis present

## 2020-03-09 LAB — CBC
HCT: 36.7 % (ref 36.0–46.0)
Hemoglobin: 12.5 g/dL (ref 12.0–15.0)
MCH: 29 pg (ref 26.0–34.0)
MCHC: 34.1 g/dL (ref 30.0–36.0)
MCV: 85.2 fL (ref 80.0–100.0)
Platelets: 508 10*3/uL — ABNORMAL HIGH (ref 150–400)
RBC: 4.31 MIL/uL (ref 3.87–5.11)
RDW: 13.2 % (ref 11.5–15.5)
WBC: 10.1 10*3/uL (ref 4.0–10.5)
nRBC: 0 % (ref 0.0–0.2)

## 2020-03-09 LAB — BASIC METABOLIC PANEL
Anion gap: 9 (ref 5–15)
BUN: 16 mg/dL (ref 6–20)
CO2: 28 mmol/L (ref 22–32)
Calcium: 7.9 mg/dL — ABNORMAL LOW (ref 8.9–10.3)
Chloride: 100 mmol/L (ref 98–111)
Creatinine, Ser: 0.78 mg/dL (ref 0.44–1.00)
GFR calc Af Amer: >60 mL/min (ref >60)
GFR calc non Af Amer: >60 mL/min (ref >60)
Glucose, Bld: 161 mg/dL — ABNORMAL HIGH (ref 70–99)
Potassium: 4 mmol/L (ref 3.5–5.1)
Sodium: 137 mmol/L (ref 135–145)

## 2020-03-09 LAB — C-REACTIVE PROTEIN: CRP: 13.3 mg/dL — ABNORMAL HIGH (ref <1.0)

## 2020-03-09 LAB — MAGNESIUM: Magnesium: 2.6 mg/dL — ABNORMAL HIGH (ref 1.7–2.4)

## 2020-03-09 NOTE — Progress Notes (Addendum)
PROGRESS NOTE    [name]  VKP:[id number] DOB: 1970-08-22 DOA: 03/03/2020 PCP: Cletis Athens, MD   Brief Narrative: Taken from H&P. Melissa [name] is a 49 y.o. female with medical history significant of hypertension, GERD, depression with anxiety, uterine perforation, bipolar, migraine, fibromyalgia, syncope, IBS, who presents with cough, shortness breath.  Found to be positive for COVID-19.  CTA negative for PE but did show multifocal pneumonia consistent with COVID-19.Unvaccinated. Procalcitonin at 0.52, initially met sepsis criteria.  Blood cultures negative. Started on remdesivir and steroid on 9/13.  Adding baricitinib on 9/14 due to worsening hypoxia.  Subjective: No more fever.  Pt reported feeling better.  O2 requirement improved down to 6L today.   Assessment & Plan:   Principal Problem:   Pneumonia due to COVID-19 virus Active Problems:   Bipolar disorder ([place])   Migraine   Acid reflux   HTN (hypertension)   Depression with anxiety   Sepsis ([place])   Acute respiratory failure with hypoxia (HCC)   Thrombocytosis (HCC)   Hyperglycemia  Acute hypoxic respiratory failure secondary to COVID-19 pneumonia sepsis secondary to Covid infection.   O2 requirement up to 13L, down to 6L today.  Elevated inflammatory markers, with mild worsening of D-dimer and improved CRP.  CTA negative for PE. procalcitonin at 0.52>>0.16>><0.10.  Most likely secondary to COVID-19 infection is improving without any antibiotics. She was started on remdesivir and steroid, baricitinib added on 03/04/2020 due to worsening hypoxia. Initially meeting sepsis criteria with fever, tachycardia and tachypnea.  Blood cultures negative so far.  No lactic acidosis. --completed Remdesivir. --CRP increased to 13.3 after having trended down to 1.6. PLAN: -Continue baricitinib --continue solumedrol 40 mg BID due to worsening CRP --trend CRP -Continue supplemental oxygen to keep the saturation above 90%, wean as  able -Encourage proning. -Extensive pulmonary toileting. --Atrovent QID  Intermittent fevers --pt developed a high fever to 102.5 morning of 9/18, after 5 days of inpatient and receiving COVID treatment. --No signs and symptoms of other source of infection --continue treatment for COVID infection  Migraine:  Patient received Emgality 3 weeks ago at home.  Takes home Rizatriptan as needed --currently stable with no headache --Imitrex PRN   Bipolar disorder, depression with anxiety:  no SI or HI. -Continue home bupropion, Luvox, klonopin PRN  GERD.  -continue PPI  Essential hypertension.   Home dose of verapamil was held due to softer blood pressure on admission. BP still soft around 90's-100's --continue to hold home verapamil  Thrombocytosis --plt has been trending up, 508 this morning --continue to monitor  Hyperglycemia likely due to steroid use No hx of DM. --monitor with morning labs.  No need for fingersticks.   Objective: Vitals:   03/09/20 0510 03/09/20 0817 03/09/20 0941 03/09/20 1227  BP: 102/60 (!) 102/56  (!) 99/55  Pulse: 81 87  84  Resp: _0 Temp: 97.6 F (36.4 C) 97.6 F (36.4 C)  97.7 F (36.5 C)  TempSrc: Oral Oral  Oral  SpO2: (!) 89% 90% 93% 91%  Weight:      Height:       No intake or output data in the 24 hours ending 03/09/20 1622 Filed Weights   03/03/20 0904 03/03/20 2318  Weight: 70.3 kg 70.3 kg    Examination:  Constitutional: NAD, AAOx3 HEENT: conjunctivae and lids normal, EOMI CV: RRR no M,R,G. Distal pulses +2.  No cyanosis.   RESP: CTA B/L, on 9L GI: +BS, NTND Extremities: No effusions, edema  in BLE SKIN: warm, dry and intact Neuro: II - XII grossly intact.  Sensation intact Psych: Normal mood and affect.  Appropriate judgement and reason   DVT prophylaxis: Lovenox SQ Code Status: Full code  Family Communication:  Status is: inpatient Dispo:   The patient is from: home Anticipated d/c is to:  home Anticipated d/c date is: unclear Patient currently is not medically stable to d/c due to: currently still on 11L High flow, still spiking fever.    Consultants:   None  Procedures:  Antimicrobials:   Data Reviewed: I have personally reviewed following labs and imaging studies  CBC: Recent Labs  Lab 03/04/20 0556 03/04/20 0556 03/05/20 0525 03/06/20 0506 03/07/20 0537 03/08/20 0702 03/09/20 0528  WBC 6.2   < > 7.6 8.4 10.4 9.6 10.1  NEUTROABS 5.5  --  6.1 7.1 9.2*  --   --   HGB 12.5   < > 12.3 13.2 12.5 12.4 12.5  HCT 37.5   < > 36.9 38.8 37.0 36.5 36.7  MCV 86.0   < > 86.8 84.5 85.3 85.3 85.2  PLT 260   < > 333 382 441* 472* 508*   < > = values in this interval not displayed.   Basic Metabolic Panel: Recent Labs  Lab 03/05/20 0525 03/06/20 0506 03/07/20 0537 03/08/20 0702 03/09/20 0528  NA 139 138 138 135 137  K 3.7 4.2 4.3 3.9 4.0  CL 98 98 100 100 100  CO2 _0 GLUCOSE 144* 146* 135* 95 161*  BUN 21* 23* _1 CREATININE 0.89 0.86 0.69 0.87 0.78  CALCIUM 8.3* 8.4* 8.1* 7.9* 7.9*  MG 2.6* 2.7* 2.8* 2.4 2.6*   GFR: Estimated Creatinine Clearance: 87.9 mL/min (by C-G formula based on SCr of 0.78 mg/dL). Liver Function Tests: Recent Labs  Lab 03/03/20 1304 03/04/20 0556 03/05/20 0525 03/06/20 0506 03/07/20 0537  AST 104* 117* 87* 103* 62*  ALT 51* 61* 59* 78* 75*  ALKPHOS 74 90 82 80 68  BILITOT 1.3* 0.9 0.6 0.8 0.8  PROT 6.8 6.8 6.4* 6.6 6.2*  ALBUMIN 2.9* 2.9* 2.7* 2.9* 2.7*   No results for input(s): LIPASE, AMYLASE in the last 168 hours. No results for input(s): AMMONIA in the last 168 hours. Coagulation Profile: No results for input(s): INR, PROTIME in the last 168 hours. Cardiac Enzymes: No results for input(s): CKTOTAL, CKMB, CKMBINDEX, TROPONINI in the last 168 hours. BNP (last 3 results) No results for input(s): PROBNP in the last 8760 hours. HbA1C: No results for input(s): HGBA1C in the last 72  hours. CBG: Recent Labs  Lab 03/08/20 1159  GLUCAP 124*   Lipid Profile: No results for input(s): CHOL, HDL, LDLCALC, TRIG, CHOLHDL, LDLDIRECT in the last 72 hours. Thyroid Function Tests: No results for input(s): TSH, T4TOTAL, FREET4, T3FREE, THYROIDAB in the last 72 hours. Anemia Panel: Recent Labs    03/07/20 0537  FERRITIN 1,301*   Sepsis Labs: Recent Labs  Lab 03/03/20 1304 03/03/20 1956 03/04/20 0556 03/05/20 0525 03/06/20 0506  PROCALCITON 0.52  --  0.27 0.16 <0.10  LATICACIDVEN  --  1.5  --   --   --     Recent Results (from the past 240 hour(s))  SARS Coronavirus 2 by RT PCR (hospital order, performed in [place] hospital lab) Nasopharyngeal Nasopharyngeal Swab     Status: Abnormal   Collection Time: 03/03/20  1:04 PM   Specimen: Nasopharyngeal Swab  Result Value Ref Range Status  SARS Coronavirus 2 POSITIVE (A) NEGATIVE Final    Comment: RESULT CALLED TO, READ BACK BY AND VERIFIED WITH:  [place] AT [id number] 03/03/20 SDR (NOTE) SARS-CoV-2 target nucleic acids are DETECTED  SARS-CoV-2 RNA is generally detectable in upper respiratory specimens  during the acute phase of infection.  Positive results are indicative  of the presence of the identified virus, but do not rule out bacterial infection or co-infection with other pathogens not detected by the test.  Clinical correlation with patient history and  other diagnostic information is necessary to determine patient infection status.  The expected result is negative.  Fact Sheet for Patients:   StrictlyIdeas.no   Fact Sheet for Healthcare Providers:   BankingDealers.co.za    This test is not yet approved or cleared by the [place] FDA and  has been authorized for detection and/or diagnosis of SARS-CoV-2 by FDA under an Emergency Use Authorization (EUA).  This EUA will remain in effect (meaning this test  can be used) for the duration of  the COVID-19  declaration under Section 564(b)(1) of the Act, 21 U.S.C. section 360-bbb-3(b)(1), unless the authorization is terminated or revoked sooner.  Performed at [facility], [address] [place], [place] [address]   Culture, blood (Routine X 2) w Reflex to ID Panel     Status: None   Collection Time: 03/03/20  7:55 PM   Specimen: BLOOD  Result Value Ref Range Status   Specimen Description BLOOD RIGHT [facility]  Final   Special Requests   Final    BOTTLES DRAWN AEROBIC AND ANAEROBIC Blood Culture adequate volume   Culture   Final    NO GROWTH 5 DAYS Performed at [facility], [address], [place], [place] [address]    Report Status 03/08/2020 FINAL  Final  Culture, blood (Routine X 2) w Reflex to ID Panel     Status: None   Collection Time: 03/03/20  7:56 PM   Specimen: BLOOD  Result Value Ref Range Status   Specimen Description BLOOD LEFT [facility]  Final   Special Requests   Final    BOTTLES DRAWN AEROBIC AND ANAEROBIC Blood Culture adequate volume   Culture   Final    NO GROWTH 5 DAYS Performed at [facility], [address] [place], [place] [address]    Report Status 03/08/2020 FINAL  Final     Radiology Studies: No results found.  Scheduled Meds: . vitamin C  500 mg Oral Daily  . baricitinib  4 mg Oral Daily  . buPROPion  150 mg Oral Daily  . cholecalciferol  5,000 Units Oral Daily  . enoxaparin (LOVENOX) injection  40 mg Subcutaneous Q24H  . fluvoxaMINE  50 mg Oral QHS  . gabapentin  600 mg Oral TID  . ipratropium  2 puff Inhalation QID  . methylPREDNISolone (SOLU-MEDROL) injection  40 mg Intravenous Q12H  . pantoprazole  40 mg Oral Daily  . vitamin B-12  1,000 mcg Oral Daily  . zinc sulfate  220 mg Oral Daily   Continuous Infusions:    LOS: 6 days    Enzo Bi, MD [facility]  If 7PM-7AM, please contact night-coverage [email or web address]  03/09/2020, 4:22 PM

## 2020-03-10 LAB — BASIC METABOLIC PANEL
Anion gap: 8 (ref 5–15)
BUN: 19 mg/dL (ref 6–20)
CO2: 28 mmol/L (ref 22–32)
Calcium: 8.3 mg/dL — ABNORMAL LOW (ref 8.9–10.3)
Chloride: 102 mmol/L (ref 98–111)
Creatinine, Ser: 0.99 mg/dL (ref 0.44–1.00)
GFR calc Af Amer: >60 mL/min (ref >60)
GFR calc non Af Amer: >60 mL/min (ref >60)
Glucose, Bld: 147 mg/dL — ABNORMAL HIGH (ref 70–99)
Potassium: 4.2 mmol/L (ref 3.5–5.1)
Sodium: 138 mmol/L (ref 135–145)

## 2020-03-10 LAB — CBC
HCT: 36.9 % (ref 36.0–46.0)
Hemoglobin: 12.1 g/dL (ref 12.0–15.0)
MCH: 28.5 pg (ref 26.0–34.0)
MCHC: 32.8 g/dL (ref 30.0–36.0)
MCV: 87 fL (ref 80.0–100.0)
Platelets: 638 10*3/uL — ABNORMAL HIGH (ref 150–400)
RBC: 4.24 MIL/uL (ref 3.87–5.11)
RDW: 13.1 % (ref 11.5–15.5)
WBC: 12.9 10*3/uL — ABNORMAL HIGH (ref 4.0–10.5)
nRBC: 0 % (ref 0.0–0.2)

## 2020-03-10 LAB — MAGNESIUM: Magnesium: 2.4 mg/dL (ref 1.7–2.4)

## 2020-03-10 LAB — C-REACTIVE PROTEIN: CRP: 5.2 mg/dL — ABNORMAL HIGH (ref <1.0)

## 2020-03-10 LAB — FIBRIN DERIVATIVES D-DIMER (ARMC ONLY): Fibrin derivatives D-dimer (ARMC): 566.31 ng/mL (FEU) — ABNORMAL HIGH (ref 0.00–499.00)

## 2020-03-10 NOTE — Progress Notes (Signed)
PROGRESS NOTE    Melissa [name]  UPJ:[id number] DOB: 25-Jul-1970 DOA: 03/03/2020 PCP: Cletis Athens, MD   Brief Narrative: Taken from H&P. Melissa [name] is a 49 y.o. female with medical history significant of hypertension, GERD, depression with anxiety, uterine perforation, bipolar, migraine, fibromyalgia, syncope, IBS, who presents with cough, shortness breath.  Found to be positive for COVID-19.  CTA negative for PE but did show multifocal pneumonia consistent with COVID-19.Unvaccinated. Procalcitonin at 0.52, initially met sepsis criteria.  Blood cultures negative. Started on remdesivir and steroid on 9/13.  Adding baricitinib on 9/14 due to worsening hypoxia.  Subjective: Pt reported doing better.  Eating.  O2 requirement down to 4L this morning.   Assessment & Plan:   Principal Problem:   Pneumonia due to COVID-19 virus Active Problems:   Bipolar disorder ([place])   Migraine   Acid reflux   HTN (hypertension)   Depression with anxiety   Sepsis ([place])   Acute respiratory failure with hypoxia (HCC)   Thrombocytosis (HCC)   Hyperglycemia  Acute hypoxic respiratory failure secondary to COVID-19 pneumonia sepsis secondary to Covid infection.   O2 requirement up to 13L, down to 4L today.  Elevated inflammatory markers, with mild worsening of D-dimer and improved CRP.  CTA negative for PE. procalcitonin at 0.52>>0.16>><0.10.  Most likely secondary to COVID-19 infection is improving without any antibiotics. She was started on remdesivir and steroid, baricitinib added on 03/04/2020 due to worsening hypoxia. Initially meeting sepsis criteria with fever, tachycardia and tachypnea.  Blood cultures negative so far.  No lactic acidosis. --completed Remdesivir. --CRP increased to 13.3 after having trended down to 1.6. PLAN: -Continue baricitinib --continue steroid as solumedrol 40 mg BID --trend CRP -Continue supplemental oxygen to keep the saturation above 90%, wean as able -Encourage  proning. -Extensive pulmonary toileting. --Atrovent QID  Intermittent fevers, resolved --pt developed a high fever to 102.5 morning of 9/18, after 5 days of inpatient and receiving COVID treatment. --No signs and symptoms of other source of infection --continue treatment for COVID infection  Migraine:  Patient received Emgality 3 weeks ago at home.  Takes home Rizatriptan as needed --currently stable with no headache --Imitrex PRN   Bipolar disorder, depression with anxiety:  no SI or HI. -on home bupropion, Luvox, klonopin PRN --continue home regimen  GERD.  --continue home PPI  Essential hypertension.   Home dose of verapamil was held due to softer blood pressure on admission. BP still soft --continue to hold home verapamil  Thrombocytosis --plt has been trending up, 638 this morning --continue to monitor  Hyperglycemia likely due to steroid use No hx of DM. --monitor with morning labs.  No need for fingersticks.   Objective: Vitals:   03/10/20 0509 03/10/20 0725 03/10/20 1147 03/10/20 1205  BP: 109/81 115/68 (!) 88/68 (!) 105/59  Pulse: 90 87 98 99  Resp: '16 16 17 17  ' Temp: 97.8 F (36.6 C) 98.2 F (36.8 C) 97.7 F (36.5 C) 97.7 F (36.5 C)  TempSrc: Oral Oral Oral Oral  SpO2: 97% 94% 95%   Weight:      Height:        Intake/Output Summary (Last 24 hours) at 03/10/2020 1517 Last data filed at 03/10/2020 1000 Gross per 24 hour  Intake 480 ml  Output --  Net 480 ml   Filed Weights   03/03/20 0904 03/03/20 2318  Weight: 70.3 kg 70.3 kg    Examination:  Constitutional: NAD, AAOx3 HEENT: conjunctivae and lids normal, EOMI CV: RRR no  M,R,G. Distal pulses +2.  No cyanosis.   RESP: CTA B/L, normal respiratory effort, on 4L GI: +BS, NTND Extremities: No effusions, edema in BLE SKIN: warm, dry and intact Neuro: II - XII grossly intact.  Sensation intact Psych: Normal mood and affect.  Appropriate judgement and reason   DVT prophylaxis: Lovenox  SQ Code Status: Full code  Family Communication:  Status is: inpatient Dispo:   The patient is from: home Anticipated d/c is to: home Anticipated d/c date is: 2-3 days Patient currently is not medically stable to d/c due to: O2 requirement is trending down, but still on 4L, still too high to be discharged.  Will consider discharge when O2 down to 2L.    Consultants:   None  Procedures:  Antimicrobials:   Data Reviewed: I have personally reviewed following labs and imaging studies  CBC: Recent Labs  Lab 03/04/20 0556 03/04/20 0556 03/05/20 0525 03/05/20 0525 03/06/20 0506 03/07/20 0537 03/08/20 0702 03/09/20 0528 03/10/20 0344  WBC 6.2   < > 7.6   < > 8.4 10.4 9.6 10.1 12.9*  NEUTROABS 5.5  --  6.1  --  7.1 9.2*  --   --   --   HGB 12.5   < > 12.3   < > 13.2 12.5 12.4 12.5 12.1  HCT 37.5   < > 36.9   < > 38.8 37.0 36.5 36.7 36.9  MCV 86.0   < > 86.8   < > 84.5 85.3 85.3 85.2 87.0  PLT 260   < > 333   < > 382 441* 472* 508* 638*   < > = values in this interval not displayed.   Basic Metabolic Panel: Recent Labs  Lab 03/06/20 0506 03/07/20 0537 03/08/20 0702 03/09/20 0528 03/10/20 0344  NA 138 138 135 137 138  K 4.2 4.3 3.9 4.0 4.2  CL 98 100 100 100 102  CO2 '30 29 26 28 28  ' GLUCOSE 146* 135* 95 161* 147*  BUN 23* '20 17 16 19  ' CREATININE 0.86 0.69 0.87 0.78 0.99  CALCIUM 8.4* 8.1* 7.9* 7.9* 8.3*  MG 2.7* 2.8* 2.4 2.6* 2.4   GFR: Estimated Creatinine Clearance: 71 mL/min (by C-G formula based on SCr of 0.99 mg/dL). Liver Function Tests: Recent Labs  Lab 03/04/20 0556 03/05/20 0525 03/06/20 0506 03/07/20 0537  AST 117* 87* 103* 62*  ALT 61* 59* 78* 75*  ALKPHOS 90 82 80 68  BILITOT 0.9 0.6 0.8 0.8  PROT 6.8 6.4* 6.6 6.2*  ALBUMIN 2.9* 2.7* 2.9* 2.7*   No results for input(s): LIPASE, AMYLASE in the last 168 hours. No results for input(s): AMMONIA in the last 168 hours. Coagulation Profile: No results for input(s): INR, PROTIME in the last 168  hours. Cardiac Enzymes: No results for input(s): CKTOTAL, CKMB, CKMBINDEX, TROPONINI in the last 168 hours. BNP (last 3 results) No results for input(s): PROBNP in the last 8760 hours. HbA1C: No results for input(s): HGBA1C in the last 72 hours. CBG: Recent Labs  Lab 03/08/20 1159  GLUCAP 124*   Lipid Profile: No results for input(s): CHOL, HDL, LDLCALC, TRIG, CHOLHDL, LDLDIRECT in the last 72 hours. Thyroid Function Tests: No results for input(s): TSH, T4TOTAL, FREET4, T3FREE, THYROIDAB in the last 72 hours. Anemia Panel: No results for input(s): VITAMINB12, FOLATE, FERRITIN, TIBC, IRON, RETICCTPCT in the last 72 hours. Sepsis Labs: Recent Labs  Lab 03/03/20 1956 03/04/20 0556 03/05/20 0525 03/06/20 0506  PROCALCITON  --  0.27 0.16 <0.10  LATICACIDVEN  1.5  --   --   --     Recent Results (from the past 240 hour(s))  SARS Coronavirus 2 by RT PCR (hospital order, performed in [facility] hospital lab) Nasopharyngeal Nasopharyngeal Swab     Status: Abnormal   Collection Time: 03/03/20  1:04 PM   Specimen: Nasopharyngeal Swab  Result Value Ref Range Status   SARS Coronavirus 2 POSITIVE (A) NEGATIVE Final    Comment: RESULT CALLED TO, READ BACK BY AND VERIFIED WITH:  [place] AT [id number] 03/03/20 SDR (NOTE) SARS-CoV-2 target nucleic acids are DETECTED  SARS-CoV-2 RNA is generally detectable in upper respiratory specimens  during the acute phase of infection.  Positive results are indicative  of the presence of the identified virus, but do not rule out bacterial infection or co-infection with other pathogens not detected by the test.  Clinical correlation with patient history and  other diagnostic information is necessary to determine patient infection status.  The expected result is negative.  Fact Sheet for Patients:   StrictlyIdeas.no   Fact Sheet for Healthcare Providers:   BankingDealers.co.za    This test is not yet  approved or cleared by the [place] FDA and  has been authorized for detection and/or diagnosis of SARS-CoV-2 by FDA under an Emergency Use Authorization (EUA).  This EUA will remain in effect (meaning this test  can be used) for the duration of  the COVID-19 declaration under Section 564(b)(1) of the Act, 21 U.S.C. section 360-bbb-3(b)(1), unless the authorization is terminated or revoked sooner.  Performed at [facility], [address] [place], [place] [address]   Culture, blood (Routine X 2) w Reflex to ID Panel     Status: None   Collection Time: 03/03/20  7:55 PM   Specimen: BLOOD  Result Value Ref Range Status   Specimen Description BLOOD RIGHT [facility]  Final   Special Requests   Final    BOTTLES DRAWN AEROBIC AND ANAEROBIC Blood Culture adequate volume   Culture   Final    NO GROWTH 5 DAYS Performed at [facility], [address], [place], [place] [address]    Report Status 03/08/2020 FINAL  Final  Culture, blood (Routine X 2) w Reflex to ID Panel     Status: None   Collection Time: 03/03/20  7:56 PM   Specimen: BLOOD  Result Value Ref Range Status   Specimen Description BLOOD LEFT [facility]  Final   Special Requests   Final    BOTTLES DRAWN AEROBIC AND ANAEROBIC Blood Culture adequate volume   Culture   Final    NO GROWTH 5 DAYS Performed at [facility], [address] [place], [place] [address]    Report Status 03/08/2020 FINAL  Final     Radiology Studies: No results found.  Scheduled Meds: . vitamin C  500 mg Oral Daily  . baricitinib  4 mg Oral Daily  . buPROPion  150 mg Oral Daily  . cholecalciferol  5,000 Units Oral Daily  . enoxaparin (LOVENOX) injection  40 mg Subcutaneous Q24H  . fluvoxaMINE  50 mg Oral QHS  . gabapentin  600 mg Oral TID  . ipratropium  2 puff Inhalation QID  . methylPREDNISolone (SOLU-MEDROL) injection  40 mg Intravenous Q12H  . pantoprazole  40 mg Oral Daily  . vitamin B-12  1,000 mcg Oral Daily  .  zinc sulfate  220 mg Oral Daily   Continuous Infusions:    LOS: 7 days  Enzo Bi, MD [facility]  If 7PM-7AM, please contact night-coverage [email or web address]  03/10/2020, 3:17 PM

## 2020-03-11 LAB — BASIC METABOLIC PANEL
Anion gap: 8 (ref 5–15)
BUN: 18 mg/dL (ref 6–20)
CO2: 28 mmol/L (ref 22–32)
Calcium: 8 mg/dL — ABNORMAL LOW (ref 8.9–10.3)
Chloride: 101 mmol/L (ref 98–111)
Creatinine, Ser: 0.83 mg/dL (ref 0.44–1.00)
GFR calc Af Amer: >60 mL/min (ref >60)
GFR calc non Af Amer: >60 mL/min (ref >60)
Glucose, Bld: 120 mg/dL — ABNORMAL HIGH (ref 70–99)
Potassium: 4.2 mmol/L (ref 3.5–5.1)
Sodium: 137 mmol/L (ref 135–145)

## 2020-03-11 LAB — CBC
HCT: 35.8 % — ABNORMAL LOW (ref 36.0–46.0)
Hemoglobin: 11.9 g/dL — ABNORMAL LOW (ref 12.0–15.0)
MCH: 29 pg (ref 26.0–34.0)
MCHC: 33.2 g/dL (ref 30.0–36.0)
MCV: 87.3 fL (ref 80.0–100.0)
Platelets: 635 10*3/uL — ABNORMAL HIGH (ref 150–400)
RBC: 4.1 MIL/uL (ref 3.87–5.11)
RDW: 13.2 % (ref 11.5–15.5)
WBC: 10.9 10*3/uL — ABNORMAL HIGH (ref 4.0–10.5)
nRBC: 0 % (ref 0.0–0.2)

## 2020-03-11 LAB — C-REACTIVE PROTEIN: CRP: 1.6 mg/dL — ABNORMAL HIGH (ref <1.0)

## 2020-03-11 LAB — MAGNESIUM: Magnesium: 2.6 mg/dL — ABNORMAL HIGH (ref 1.7–2.4)

## 2020-03-11 NOTE — Plan of Care (Signed)
  Problem: Education: Goal: Knowledge of risk factors and measures for prevention of condition will improve Outcome: Progressing   Problem: Coping: Goal: Psychosocial and spiritual needs will be supported Outcome: Progressing   Problem: Respiratory: Goal: Will maintain a patent airway Outcome: Progressing Goal: Complications related to the disease process, condition or treatment will be avoided or minimized Outcome: Progressing   

## 2020-03-11 NOTE — Progress Notes (Signed)
PROGRESS NOTE    KHOLE ARTERBURN  FHQ:[id number] DOB: 1971-03-07 DOA: 03/03/2020 PCP: Cletis Athens, MD   Brief Narrative: Taken from H&P. Melissa [name] is a 49 y.o. female with medical history significant of hypertension, GERD, depression with anxiety, uterine perforation, bipolar, migraine, fibromyalgia, syncope, IBS, who presents with cough, shortness breath.  Found to be positive for COVID-19.  CTA negative for PE but did show multifocal pneumonia consistent with COVID-19.Unvaccinated. Procalcitonin at 0.52, initially met sepsis criteria.  Blood cultures negative. Started on remdesivir and steroid on 9/13.  Adding baricitinib on 9/14 due to worsening hypoxia.  Subjective: Pt doing well.  Coughing up sputum.  O2 requirement down to 3L today.   Assessment & Plan:   Principal Problem:   Pneumonia due to COVID-19 virus Active Problems:   Bipolar disorder ([place])   Migraine   Acid reflux   HTN (hypertension)   Depression with anxiety   Sepsis ([place])   Acute respiratory failure with hypoxia (HCC)   Thrombocytosis (HCC)   Hyperglycemia  Acute hypoxic respiratory failure secondary to COVID-19 pneumonia sepsis secondary to Covid infection.   O2 requirement up to 13L, down to 4L today.  Elevated inflammatory markers, with mild worsening of D-dimer and improved CRP.  CTA negative for PE. procalcitonin at 0.52>>0.16>><0.10.  Most likely secondary to COVID-19 infection is improving without any antibiotics. She was started on remdesivir and steroid, baricitinib added on 03/04/2020 due to worsening hypoxia. Initially meeting sepsis criteria with fever, tachycardia and tachypnea.  Blood cultures negative so far.  No lactic acidosis. --completed Remdesivir. --CRP increased to 13.3 after having trended down to 1.6, so steroid increased back up to solumedrol 40 mg BID.  CRP today back down to 1.6 again. PLAN: -Continue baricitinib --taper down steroid to solumedrol 40 mg daily today --trend  CRP -Continue supplemental oxygen to keep the saturation above 90%, wean as able -Extensive pulmonary toileting. --Atrovent QID  Intermittent fevers, resolved --pt developed a high fever to 102.5 morning of 9/18, after 5 days of inpatient and receiving COVID treatment. --No signs and symptoms of other source of infection --continue treatment for COVID infection  Migraine:  Patient received Emgality 3 weeks ago at home.  Takes home Rizatriptan as needed --currently stable with no headache --Imitrex PRN   Bipolar disorder, depression with anxiety:  no SI or HI. -on home bupropion, Luvox, klonopin PRN --continue home regimen  GERD.  --continue home PPI  Essential hypertension.   Home dose of verapamil was held due to softer blood pressure on admission. BP from 80's to 130's --continue to hold home verapamil --continue to hold home verapamil  Thrombocytosis --plt has been trending up, seems to have stabilized around 630's  --continue to monitor  Hyperglycemia likely due to steroid use No hx of DM. --BG within inpatient ragne. --No need for fingersticks.    Objective: Vitals:   03/11/20 0449 03/11/20 0751 03/11/20 1023 03/11/20 1145  BP: 107/60 119/66  124/73  Pulse: 79 (!) 106  (!) 105  Resp: _0 Temp: 97.9 F (36.6 C) 97.8 F (36.6 C)  98.2 F (36.8 C)  TempSrc: Oral Oral  Oral  SpO2: 96% 93% 92% 94%  Weight:      Height:       No intake or output data in the 24 hours ending 03/11/20 1516 Filed Weights   03/03/20 0904 03/03/20 2318  Weight: 70.3 kg 70.3 kg    Examination:  Constitutional: NAD, AAOx3 HEENT: conjunctivae and  lids normal, EOMI CV: RRR no M,R,G. Distal pulses +2.  No cyanosis.   RESP: normal respiratory effort, on 3L GI: +BS, NTND Extremities: No effusions, edema in BLE SKIN: warm, dry and intact Neuro: II - XII grossly intact.  Sensation intact Psych: Normal mood and affect.  Appropriate judgement and reason    DVT  prophylaxis: Lovenox SQ Code Status: Full code  Family Communication:  Status is: inpatient Dispo:   The patient is from: home Anticipated d/c is to: home Anticipated d/c date is: 1-2 days Patient currently is not medically stable to d/c due to: O2 requirement is trending down, can consider discharge when down to 2L, provided that CRP does not trend up again.   Consultants:   None  Procedures:  Antimicrobials:   Data Reviewed: I have personally reviewed following labs and imaging studies  CBC: Recent Labs  Lab 03/05/20 0525 03/05/20 0525 03/06/20 0506 03/06/20 0506 03/07/20 0537 03/08/20 0702 03/09/20 0528 03/10/20 0344 03/11/20 0608  WBC 7.6   < > 8.4   < > 10.4 9.6 10.1 12.9* 10.9*  NEUTROABS 6.1  --  7.1  --  9.2*  --   --   --   --   HGB 12.3   < > 13.2   < > 12.5 12.4 12.5 12.1 11.9*  HCT 36.9   < > 38.8   < > 37.0 36.5 36.7 36.9 35.8*  MCV 86.8   < > 84.5   < > 85.3 85.3 85.2 87.0 87.3  PLT 333   < > 382   < > 441* 472* 508* 638* 635*   < > = values in this interval not displayed.   Basic Metabolic Panel: Recent Labs  Lab 03/07/20 0537 03/08/20 0702 03/09/20 0528 03/10/20 0344 03/11/20 0608  NA 138 135 137 138 137  K 4.3 3.9 4.0 4.2 4.2  CL 100 100 100 102 101  CO2 _0 GLUCOSE 135* 95 161* 147* 120*  BUN _1 CREATININE 0.69 0.87 0.78 0.99 0.83  CALCIUM 8.1* 7.9* 7.9* 8.3* 8.0*  MG 2.8* 2.4 2.6* 2.4 2.6*   GFR: Estimated Creatinine Clearance: 84.7 mL/min (by C-G formula based on SCr of 0.83 mg/dL). Liver Function Tests: Recent Labs  Lab 03/05/20 0525 03/06/20 0506 03/07/20 0537  AST 87* 103* 62*  ALT 59* 78* 75*  ALKPHOS 82 80 68  BILITOT 0.6 0.8 0.8  PROT 6.4* 6.6 6.2*  ALBUMIN 2.7* 2.9* 2.7*   No results for input(s): LIPASE, AMYLASE in the last 168 hours. No results for input(s): AMMONIA in the last 168 hours. Coagulation Profile: No results for input(s): INR, PROTIME in the last 168 hours. Cardiac  Enzymes: No results for input(s): CKTOTAL, CKMB, CKMBINDEX, TROPONINI in the last 168 hours. BNP (last 3 results) No results for input(s): PROBNP in the last 8760 hours. HbA1C: No results for input(s): HGBA1C in the last 72 hours. CBG: Recent Labs  Lab 03/08/20 1159  GLUCAP 124*   Lipid Profile: No results for input(s): CHOL, HDL, LDLCALC, TRIG, CHOLHDL, LDLDIRECT in the last 72 hours. Thyroid Function Tests: No results for input(s): TSH, T4TOTAL, FREET4, T3FREE, THYROIDAB in the last 72 hours. Anemia Panel: No results for input(s): VITAMINB12, FOLATE, FERRITIN, TIBC, IRON, RETICCTPCT in the last 72 hours. Sepsis Labs: Recent Labs  Lab 03/05/20 0525 03/06/20 0506  PROCALCITON 0.16 <0.10    Recent Results (from the past 240 hour(s))  SARS Coronavirus 2 by RT  PCR (hospital order, performed in [facility] hospital lab) Nasopharyngeal Nasopharyngeal Swab     Status: Abnormal   Collection Time: 03/03/20  1:04 PM   Specimen: Nasopharyngeal Swab  Result Value Ref Range Status   SARS Coronavirus 2 POSITIVE (A) NEGATIVE Final    Comment: RESULT CALLED TO, READ BACK BY AND VERIFIED WITH:  [place] AT 5947 03/03/20 SDR (NOTE) SARS-CoV-2 target nucleic acids are DETECTED  SARS-CoV-2 RNA is generally detectable in upper respiratory specimens  during the acute phase of infection.  Positive results are indicative  of the presence of the identified virus, but do not rule out bacterial infection or co-infection with other pathogens not detected by the test.  Clinical correlation with patient history and  other diagnostic information is necessary to determine patient infection status.  The expected result is negative.  Fact Sheet for Patients:   StrictlyIdeas.no   Fact Sheet for Healthcare Providers:   BankingDealers.co.za    This test is not yet approved or cleared by the [place] FDA and  has been authorized for detection and/or  diagnosis of SARS-CoV-2 by FDA under an Emergency Use Authorization (EUA).  This EUA will remain in effect (meaning this test  can be used) for the duration of  the COVID-19 declaration under Section 564(b)(1) of the Act, 21 U.S.C. section 360-bbb-3(b)(1), unless the authorization is terminated or revoked sooner.  Performed at [facility], [address] [place], [place] [address]   Culture, blood (Routine X 2) w Reflex to ID Panel     Status: None   Collection Time: 03/03/20  7:55 PM   Specimen: BLOOD  Result Value Ref Range Status   Specimen Description BLOOD RIGHT [facility]  Final   Special Requests   Final    BOTTLES DRAWN AEROBIC AND ANAEROBIC Blood Culture adequate volume   Culture   Final    NO GROWTH 5 DAYS Performed at [facility], [address] [place], [place] [address]    Report Status 03/08/2020 FINAL  Final  Culture, blood (Routine X 2) w Reflex to ID Panel     Status: None   Collection Time: 03/03/20  7:56 PM   Specimen: BLOOD  Result Value Ref Range Status   Specimen Description BLOOD LEFT [facility]  Final   Special Requests   Final    BOTTLES DRAWN AEROBIC AND ANAEROBIC Blood Culture adequate volume   Culture   Final    NO GROWTH 5 DAYS Performed at [facility], [address] [place], [place] [address]    Report Status 03/08/2020 FINAL  Final     Radiology Studies: No results found.  Scheduled Meds:  vitamin C  500 mg Oral Daily   baricitinib  4 mg Oral Daily   buPROPion  150 mg Oral Daily   cholecalciferol  5,000 Units Oral Daily   enoxaparin (LOVENOX) injection  40 mg Subcutaneous Q24H   fluvoxaMINE  50 mg Oral QHS   gabapentin  600 mg Oral TID   ipratropium  2 puff Inhalation QID   methylPREDNISolone (SOLU-MEDROL) injection  40 mg Intravenous Q12H   pantoprazole  40 mg Oral Daily   vitamin B-12  1,000 mcg Oral Daily   zinc sulfate  220 mg Oral Daily   Continuous Infusions:    LOS: 8 days    Enzo Bi,  MD [facility]  If 7PM-7AM, please contact night-coverage [email or web address]  03/11/2020, 3:16 PM

## 2020-03-12 LAB — CBC
HCT: 34.7 % — ABNORMAL LOW (ref 36.0–46.0)
Hemoglobin: 11.9 g/dL — ABNORMAL LOW (ref 12.0–15.0)
MCH: 29.2 pg (ref 26.0–34.0)
MCHC: 34.3 g/dL (ref 30.0–36.0)
MCV: 85.3 fL (ref 80.0–100.0)
Platelets: 628 10*3/uL — ABNORMAL HIGH (ref 150–400)
RBC: 4.07 MIL/uL (ref 3.87–5.11)
RDW: 13.2 % (ref 11.5–15.5)
WBC: 12.2 10*3/uL — ABNORMAL HIGH (ref 4.0–10.5)
nRBC: 0 % (ref 0.0–0.2)

## 2020-03-12 LAB — BASIC METABOLIC PANEL
Anion gap: 11 (ref 5–15)
BUN: 18 mg/dL (ref 6–20)
CO2: 27 mmol/L (ref 22–32)
Calcium: 8.1 mg/dL — ABNORMAL LOW (ref 8.9–10.3)
Chloride: 102 mmol/L (ref 98–111)
Creatinine, Ser: 0.81 mg/dL (ref 0.44–1.00)
GFR calc Af Amer: >60 mL/min (ref >60)
GFR calc non Af Amer: >60 mL/min (ref >60)
Glucose, Bld: 81 mg/dL (ref 70–99)
Potassium: 3.8 mmol/L (ref 3.5–5.1)
Sodium: 140 mmol/L (ref 135–145)

## 2020-03-12 LAB — C-REACTIVE PROTEIN: CRP: 0.8 mg/dL (ref <1.0)

## 2020-03-12 MED ORDER — ZINC SULFATE 220 (50 ZN) MG PO CAPS: 220.0000 mg | ORAL_CAPSULE | Freq: Every day | ORAL | 0 refills | Status: AC

## 2020-03-12 MED ORDER — DEXAMETHASONE 6 MG PO TABS: 6.0000 mg | ORAL_TABLET | Freq: Every day | ORAL | 0 refills | Status: AC

## 2020-03-12 MED ORDER — IPRATROPIUM BROMIDE HFA 17 MCG/ACT IN AERS: 2.0000 | INHALATION_SPRAY | Freq: Four times a day (QID) | RESPIRATORY_TRACT | 12 refills | Status: DC

## 2020-03-12 MED ORDER — DEXAMETHASONE 6 MG PO TABS
6.0000 mg | ORAL_TABLET | Freq: Every day | ORAL | Status: DC
  Administered 2020-03-12: 09:00:00 6 mg via ORAL
  Filled 2020-03-12: qty 1

## 2020-03-12 MED ORDER — ASCORBIC ACID 500 MG PO TABS: 500.0000 mg | ORAL_TABLET | Freq: Every day | ORAL | 0 refills | Status: AC

## 2020-03-12 MED ORDER — VERAPAMIL HCL ER 120 MG PO TBCR: EXTENDED_RELEASE_TABLET | ORAL | 3 refills | Status: DC

## 2020-03-12 NOTE — Care Management Important Message (Signed)
Important Message  Patient Details  Name: Melissa [name] MRN: [id number] Date of Birth: 06-Aug-1970   Medicare Important Message Given:  Yes  Reviewed with patient via room phone due to isolation status.  Declined copy of Medicare IM at this time. One previously sent to home address by Admitting last week.     Dannette Barbara 03/12/2020, 10:44 AM

## 2020-03-12 NOTE — Progress Notes (Signed)
Pt being discharged home today, discharge instructions reviewed with pt, states understanding, home meds returned, pt with no complaints, awaiting o2 delivery

## 2020-03-12 NOTE — Progress Notes (Signed)
SATURATION QUALIFICATIONS: (This note is used to comply with regulatory documentation for home oxygen)  Patient Saturations on Room Air at Rest = 83%  Patient Saturations on Room Air while Ambulating = 73%  Patient Saturations on 4Liters of oxygen while Ambulating = 90%  Please briefly explain why patient needs home oxygen:

## 2020-03-12 NOTE — Discharge Summary (Signed)
Physician Discharge Summary   Melissa [name]  female DOB: Mar 17, 1971  BTD:[id number]  PCP: Cletis Athens, MD  Admit date: 03/03/2020 Discharge date: 03/12/2020  Admitted From: home Disposition:  home CODE STATUS: Full code  Discharge Instructions    Discharge instructions   Complete by: As directed    You have completed your covid treatment and improving, so you can go home to recover.  You still need 4 liters of supplemental oxygen, but I expect that with time, you should be able to get off of oxygen.  Please follow up with your outpatient doctor to monitor your supplemental oxygen need.  Since you had significant inflammation that needed many days of IV steroids, I am sending you home with 5 more days of oral steroid Decadron, to be taken as directed.   Dr. Enzo Bi [facility] Course:  For full details, please see H&P, progress notes, consult notes and ancillary notes.  Briefly,  Shawndra Clute Couchis a 49 y.o.femalewith medical history significant ofhypertension, GERD, depression with anxiety, uterine perforation, bipolar, migraine, fibromyalgia, syncope, IBS, who presented with cough, shortness breath.  Found to be positive for COVID-19.  CTA negative for PE but did show multifocal pneumonia consistent with COVID-19.  Unvaccinated.  Started on remdesivir and steroid on 9/13.  Adding baricitinib on 9/14 due to worsening hypoxia.  Sepsis ruled out  Acute hypoxic respiratory failure secondary to COVID-19 pneumonia O2 requirement up to 13L,  CTA negative for PE.  procalcitonin at 0.52 which trended down to <0.1 without any antibiotics.  Pt was started on remdesivir and steroid on presentation, baricitinib added on 03/04/2020 due to worsening hypoxia.  CRP was severely elevated at 21.8 on presentation, so pt received high-dose solumedrol.  CRP initially responded to steroid and trended down to 1.6, however, when steroid was tapered, CRP acutely increased to 13.3, so pt had  to be put back on solumedrol 40 mg BID.  Prior to discharge, CRP back down to 0.8, but pt was discharged on 5 more days of oral Decadron.  Pt still needed 4L supplemental O2 at discharge, however, had been stable and feeling well, so was advised to follow up with PCP to monitor her oxygen needs after discharge.  Intermittent fevers, resolved pt developed a high fever to 102.5 morning of 9/18, after 5 days of inpatient and receiving COVID treatment.  No signs and symptoms of other source of infection.  No abx was initiated.  No more fever after 9/18.  Hx of Migraine Patient receivedEmgality3 weeks ago at home.  Takes home Rizatriptan as needed.  Pt was without migraine during her hospitalization.  Bipolar disorder, depression with anxiety Stable, no SI or HI.  Continued on home bupropion, Luvox, klonopin PRN  GERD.  continued home PPI  Essential hypertension.   Home dose of verapamil was held due to softer blood pressure on admission. BP from 80's to 130's during hospitalization.  Verapamil held after discharge pending PCP followup.  Thrombocytosis, reactive plt trended up, and stabilized around 630's.  Likely reactive to acute infection.  Hyperglycemia likely due to steroid use No hx of DM.  BG within inpatient ragne.  No need for fingersticks.   Discharge Diagnoses:  Principal Problem:   Pneumonia due to COVID-19 virus Active Problems:   Bipolar disorder ([place])   Migraine   Acid reflux   HTN (hypertension)   Depression with anxiety   Sepsis ([place])   Acute respiratory failure with hypoxia (  [place])   Thrombocytosis ([place])   Hyperglycemia    Discharge Instructions:  Allergies as of 03/12/2020      Reactions   Lamotrigine Hives, Swelling   Abilify [aripiprazole] Other (See Comments)   Weight gain, did not help,    Azithromycin Rash   Depakote [divalproex Sodium] Other (See Comments)   Hair loss   Erythromycin Base Swelling   Lithium Rash   Penicillins Rash   Has  patient had a PCN reaction causing immediate rash, facial/tongue/throat swelling, SOB or lightheadedness with hypotension: Yes- swelling, rash  Has patient had a PCN reaction causing severe rash involving mucus membranes or skin necrosis: Yes Has patient had a PCN reaction that required hospitalization No Has patient had a PCN reaction occurring within the last 10 years: No If all of the above answers are "NO", then may proceed with Cephalosporin use.   Pregabalin Palpitations   Manic symptoms, no sleep x's 3 days, no appetite.   Seroquel [quetiapine Fumarate] Other (See Comments)   Irritable   Sulfa Antibiotics Rash   Tetanus Toxoids Other (See Comments)   Swelling to injection site   Tramadol Nausea And Vomiting   Ziprasidone Hcl Other (See Comments)   Heat/cold intolerance.       Medication List    STOP taking these medications   diclofenac sodium 1 % Gel Commonly known as: VOLTAREN   HYDROcodone-acetaminophen 5-325 MG tablet Commonly known as: NORCO/VICODIN   methocarbamol 500 MG tablet Commonly known as: ROBAXIN   ondansetron 4 MG disintegrating tablet Commonly known as: ZOFRAN-ODT   traZODone 50 MG tablet Commonly known as: DESYREL     TAKE these medications   acetaminophen 500 MG tablet Commonly known as: TYLENOL Take 1,000 mg by mouth daily as needed for mild pain.   ascorbic acid 500 MG tablet Commonly known as: VITAMIN C Take 1 tablet (500 mg total) by mouth daily. Start taking on: March 13, 2020   buPROPion 150 MG 24 hr tablet Commonly known as: WELLBUTRIN XL Take 1 tablet (150 mg total) by mouth daily.   cholecalciferol 1000 units tablet Commonly known as: VITAMIN D Take 5,000 Units by mouth daily.   clonazePAM 1 MG tablet Commonly known as: KLONOPIN Take 1 tablet (1 mg total) by mouth 2 (two) times daily as needed. for anxiety   cyclobenzaprine 10 MG tablet Commonly known as: FLEXERIL Take 10 mg by mouth 3 (three) times daily as needed for  muscle spasms.   dexamethasone 6 MG tablet Commonly known as: DECADRON Take 1 tablet (6 mg total) by mouth daily for 5 days. Steroid to calm inflammation. Start taking on: March 13, 2020   Emgality 120 MG/ML Soaj Generic drug: Galcanezumab-gnlm Inject 120 mg into the skin every 30 (thirty) days.   fluvoxaMINE 50 MG tablet Commonly known as: LUVOX Take 50-100 mg by mouth at bedtime.   gabapentin 600 MG tablet Commonly known as: NEURONTIN Take 1 tablet (600 mg total) by mouth 3 (three) times daily.   ipratropium 17 MCG/ACT inhaler Commonly known as: ATROVENT HFA Inhale 2 puffs into the lungs 4 (four) times daily for 10 days.   norgestimate-ethinyl estradiol 0.25-35 MG-MCG tablet Commonly known as: Sprintec 28 Take 1 tablet by mouth daily.   rizatriptan 10 MG disintegrating tablet Commonly known as: MAXALT-MLT Take 1 tablet (10 mg total) by mouth as needed for migraine. May repeat in 2 hours if needed   verapamil 120 MG CR tablet Commonly known as: CALAN-SR Hold this until followup with  outpatient doctor because your blood pressure has been normal without it in the hospital. What changed:   how much to take  how to take this  when to take this  additional instructions   vitamin B-12 1000 MCG tablet Commonly known as: CYANOCOBALAMIN Take 1,000 mcg by mouth daily.   zinc sulfate 220 (50 Zn) MG capsule Take 1 capsule (220 mg total) by mouth daily. Start taking on: March 13, 2020            Durable Medical Equipment  (From admission, onward)         Start     Ordered   03/12/20 0948  For home use only DME oxygen  Once       Question Answer Comment  Length of Need 6 Months   Mode or (Route) Nasal cannula   Liters per Minute 4   Oxygen delivery system Gas      03/12/20 0948           Follow-up Information    Cletis Athens, MD. Schedule an appointment as soon as possible for a visit in 1 week.   Specialties: Internal Medicine,  Cardiology Why: patient to schedule own appointment; physician's office closed Wednesday Contact information: [address] [place] [address] [phone]               Allergies  Allergen Reactions  . Lamotrigine Hives and Swelling  . Abilify [Aripiprazole] Other (See Comments)    Weight gain, did not help,   . Azithromycin Rash  . Depakote [Divalproex Sodium] Other (See Comments)    Hair loss  . Erythromycin Base Swelling  . Lithium Rash  . Penicillins Rash    Has patient had a PCN reaction causing immediate rash, facial/tongue/throat swelling, SOB or lightheadedness with hypotension: Yes- swelling, rash  Has patient had a PCN reaction causing severe rash involving mucus membranes or skin necrosis: Yes Has patient had a PCN reaction that required hospitalization No Has patient had a PCN reaction occurring within the last 10 years: No If all of the above answers are "NO", then may proceed with Cephalosporin use.   . Pregabalin Palpitations    Manic symptoms, no sleep x's 3 days, no appetite.  . Seroquel [Quetiapine Fumarate] Other (See Comments)    Irritable  . Sulfa Antibiotics Rash  . Tetanus Toxoids Other (See Comments)    Swelling to injection site  . Tramadol Nausea And Vomiting  . Ziprasidone Hcl Other (See Comments)    Heat/cold intolerance.      The results of significant diagnostics from this hospitalization (including imaging, microbiology, ancillary and laboratory) are listed below for reference.   Consultations:   Procedures/Studies: DG Chest 2 View  Result Date: 03/03/2020 CLINICAL DATA:  Shortness of breath and cough EXAM: CHEST - 2 VIEW COMPARISON:  September 16, 2018 FINDINGS: There is ill-defined airspace opacity in the left base. There is slight atelectatic change in the mid lung regions bilaterally and right base. Heart size and pulmonary vascularity are normal. No adenopathy. No bone lesions. IMPRESSION: Focal airspace opacity felt to represent  focus of pneumonia left base. Subtle atelectasis right base and left mid lung regions in right base. Question early pneumonia in these areas as well. This appearance warrants check of COVID-19 status. Heart size normal.  No adenopathy. Electronically Signed   By: Lowella Grip III M.D.   On: 03/03/2020 09:39   CT Angio Chest PE W and/or Wo Contrast  Result Date: 03/03/2020 CLINICAL DATA:  Dyspnea, fever, COVID pneumonia, EXAM: CT ANGIOGRAPHY CHEST WITH CONTRAST TECHNIQUE: Multidetector CT imaging of the chest was performed using the standard protocol during bolus administration of intravenous contrast. Multiplanar CT image reconstructions and MIPs were obtained to evaluate the vascular anatomy. CONTRAST:  44mL OMNIPAQUE IOHEXOL 350 MG/ML SOLN COMPARISON:  None. FINDINGS: Cardiovascular: Satisfactory opacification of the pulmonary arteries to the segmental level. No evidence of pulmonary embolism. Normal heart size. No pericardial effusion. The thoracic aorta is of normal caliber Mediastinum/Nodes: There is shotty prevascular and bilateral hilar adenopathy present, possibly reactive in nature. No frankly pathologic thoracic adenopathy. The visualized thyroid is unremarkable. Esophagus is unremarkable. Lungs/Pleura: There are extensive bilateral scattered ground-glass pulmonary infiltrates demonstrating a peripheral, subpleural predominance in keeping with changes of atypical infection and compatible with the given history of COVID-19 pneumonia. Lung volumes are small, but are symmetric. No pneumothorax or pleural effusion. Central airways are widely patent. Upper Abdomen: No acute abnormality. Musculoskeletal: No chest wall abnormality. No acute or significant osseous findings. Review of the MIP images confirms the above findings. IMPRESSION: 1. No evidence of pulmonary embolism. 2. Extensive bilateral scattered ground-glass pulmonary infiltrates demonstrating a peripheral, subpleural predominance in keeping  with changes of atypical infection and compatible with the given history of COVID-19 pneumonia. 3. Shotty prevascular and bilateral hilar adenopathy, possibly reactive in nature. Electronically Signed   By: Fidela Salisbury MD   On: 03/03/2020 15:41      Labs: BNP (last 3 results) Recent Labs    03/03/20 1956  BNP 43.1   Basic Metabolic Panel: Recent Labs  Lab 03/07/20 0537 03/07/20 0537 03/08/20 0702 03/09/20 0528 03/10/20 0344 03/11/20 0608 03/12/20 0613  NA 138   < > 135 137 138 137 140  K 4.3   < > 3.9 4.0 4.2 4.2 3.8  CL 100   < > 100 100 102 101 102  CO2 29   < > 26 28 28 28 27   GLUCOSE 135*   < > 95 161* 147* 120* 81  BUN 20   < > 17 16 19 18 18   CREATININE 0.69   < > 0.87 0.78 0.99 0.83 0.81  CALCIUM 8.1*   < > 7.9* 7.9* 8.3* 8.0* 8.1*  MG 2.8*  --  2.4 2.6* 2.4 2.6*  --    < > = values in this interval not displayed.   Liver Function Tests: Recent Labs  Lab 03/06/20 0506 03/07/20 0537  AST 103* 62*  ALT 78* 75*  ALKPHOS 80 68  BILITOT 0.8 0.8  PROT 6.6 6.2*  ALBUMIN 2.9* 2.7*   No results for input(s): LIPASE, AMYLASE in the last 168 hours. No results for input(s): AMMONIA in the last 168 hours. CBC: Recent Labs  Lab 03/06/20 0506 03/06/20 0506 03/07/20 0537 03/07/20 0537 03/08/20 0702 03/09/20 0528 03/10/20 0344 03/11/20 0608 03/12/20 0613  WBC 8.4   < > 10.4   < > 9.6 10.1 12.9* 10.9* 12.2*  NEUTROABS 7.1  --  9.2*  --   --   --   --   --   --   HGB 13.2   < > 12.5   < > 12.4 12.5 12.1 11.9* 11.9*  HCT 38.8   < > 37.0   < > 36.5 36.7 36.9 35.8* 34.7*  MCV 84.5   < > 85.3   < > 85.3 85.2 87.0 87.3 85.3  PLT 382   < > 441*   < > 472* 508* 638* 635* 628*   < > =  values in this interval not displayed.   Cardiac Enzymes: No results for input(s): CKTOTAL, CKMB, CKMBINDEX, TROPONINI in the last 168 hours. BNP: Invalid input(s): POCBNP CBG: Recent Labs  Lab 03/08/20 1159  GLUCAP 124*   D-Dimer No results for input(s): DDIMER in the last  72 hours. Hgb A1c No results for input(s): HGBA1C in the last 72 hours. Lipid Profile No results for input(s): CHOL, HDL, LDLCALC, TRIG, CHOLHDL, LDLDIRECT in the last 72 hours. Thyroid function studies No results for input(s): TSH, T4TOTAL, T3FREE, THYROIDAB in the last 72 hours.  Invalid input(s): FREET3 Anemia work up No results for input(s): VITAMINB12, FOLATE, FERRITIN, TIBC, IRON, RETICCTPCT in the last 72 hours. Urinalysis    Component Value Date/Time   COLORURINE YELLOW (A) 06/26/2019 1720   APPEARANCEUR CLEAR (A) 06/26/2019 1720   APPEARANCEUR Clear 11/04/2012 1650   LABSPEC 1.019 06/26/2019 1720   LABSPEC 1.012 11/04/2012 1650   PHURINE 5.0 06/26/2019 1720   GLUCOSEU NEGATIVE 06/26/2019 1720   GLUCOSEU Negative 11/04/2012 1650   HGBUR NEGATIVE 06/26/2019 1720   BILIRUBINUR NEGATIVE 06/26/2019 1720   BILIRUBINUR Negative 09/08/2017 0904   BILIRUBINUR Negative 11/04/2012 1650   KETONESUR NEGATIVE 06/26/2019 1720   PROTEINUR NEGATIVE 06/26/2019 1720   UROBILINOGEN 0.2 09/08/2017 0904   UROBILINOGEN 0.2 10/21/2014 1321   NITRITE NEGATIVE 06/26/2019 1720   LEUKOCYTESUR MODERATE (A) 06/26/2019 1720   LEUKOCYTESUR Trace 11/04/2012 1650   Sepsis Labs Invalid input(s): PROCALCITONIN,  WBC,  LACTICIDVEN Microbiology Recent Results (from the past 240 hour(s))  SARS Coronavirus 2 by RT PCR (hospital order, performed in [place] hospital lab) Nasopharyngeal Nasopharyngeal Swab     Status: Abnormal   Collection Time: 03/03/20  1:04 PM   Specimen: Nasopharyngeal Swab  Result Value Ref Range Status   SARS Coronavirus 2 POSITIVE (A) NEGATIVE Final    Comment: RESULT CALLED TO, READ BACK BY AND VERIFIED WITH:  [place] AT [id number] 03/03/20 SDR (NOTE) SARS-CoV-2 target nucleic acids are DETECTED  SARS-CoV-2 RNA is generally detectable in upper respiratory specimens  during the acute phase of infection.  Positive results are indicative  of the presence of the identified virus,  but do not rule out bacterial infection or co-infection with other pathogens not detected by the test.  Clinical correlation with patient history and  other diagnostic information is necessary to determine patient infection status.  The expected result is negative.  Fact Sheet for Patients:   StrictlyIdeas.no   Fact Sheet for Healthcare Providers:   BankingDealers.co.za    This test is not yet approved or cleared by the [place] FDA and  has been authorized for detection and/or diagnosis of SARS-CoV-2 by FDA under an Emergency Use Authorization (EUA).  This EUA will remain in effect (meaning this test  can be used) for the duration of  the COVID-19 declaration under Section 564(b)(1) of the Act, 21 U.S.C. section 360-bbb-3(b)(1), unless the authorization is terminated or revoked sooner.  Performed at [facility], [address] [place], [place] [address]   Culture, blood (Routine X 2) w Reflex to ID Panel     Status: None   Collection Time: 03/03/20  7:55 PM   Specimen: BLOOD  Result Value Ref Range Status   Specimen Description BLOOD RIGHT [facility]  Final   Special Requests   Final    BOTTLES DRAWN AEROBIC AND ANAEROBIC Blood Culture adequate volume   Culture   Final    NO GROWTH 5 DAYS Performed at [facility],  [place], [place] [address]    Report Status 03/08/2020 FINAL  Final  Culture, blood (Routine X 2) w Reflex to ID Panel     Status: None   Collection Time: 03/03/20  7:56 PM   Specimen: BLOOD  Result Value Ref Range Status   Specimen Description BLOOD LEFT [facility]  Final   Special Requests   Final    BOTTLES DRAWN AEROBIC AND ANAEROBIC Blood Culture adequate volume   Culture   Final    NO GROWTH 5 DAYS Performed at [facility], [address], [place], [place] [address]    Report Status 03/08/2020 FINAL  Final     Total time spend on discharging this patient, including  the last patient exam, discussing the hospital stay, instructions for ongoing care as it relates to all pertinent caregivers, as well as preparing the medical discharge records, prescriptions, and/or referrals as applicable, is 35 minutes.    Enzo Bi, MD  [facility] 03/12/2020, 10:16 AM  If 7PM-7AM, please contact night-coverage

## 2020-03-12 NOTE — TOC Initial Note (Signed)
Transition of Care [facility]) - Initial/Assessment Note    Patient Details  Name: Melissa [name] MRN: [id number] Date of Birth: [birth year]  Transition of Care [employer] [facility]) CM/SW Contact:    Shelbie Hutching, RN Phone Number: 03/12/2020, 9:35 AM  Clinical Narrative:                 Patient admitted to the hospital with COVID requiring supplemental oxygen.  Patient has been medically cleared for discharge, patient will need oxygen at home.  Patient lives with her husband and is independent in ADL's.  Patient's husband will be picking her up today and will be at home to help her while she is recovering.  Patient is current with PCP and uses [facility] for prescriptions. Home O2 referral given to [facility] with [employer].  Patient has a pulse oximeter at home.   Oxygen will be delivered to the patient room before discharge home.   Expected Discharge Plan: Home/Self Care Barriers to Discharge: Barriers Resolved   Patient Goals and CMS Choice Patient states their goals for this hospitalization and ongoing recovery are:: Patient glad to be going home CMS Medicare.gov Compare Post Acute Care list provided to:: Patient Choice offered to / list presented to : Patient  Expected Discharge Plan and Services Expected Discharge Plan: Home/Self Care   Discharge Planning Services: CM Consult Post Acute Care Choice: Durable Medical Equipment Living arrangements for the past 2 months: Single Family Home Expected Discharge Date: 03/12/20               DME Arranged: Oxygen DME Agency: [employer] Date DME Agency Contacted: 03/12/20 Time DME Agency Contacted: [phone] Representative spoke with at DME Agency: [place] Arranged: NA          Prior Living Arrangements/Services Living arrangements for the past 2 months: [place] Lives with:: Spouse Patient language and need for interpreter reviewed:: Yes Do you feel safe going back to the place where you live?: Yes      Need for Family  Participation in Patient Care: Yes (Comment) (COVID) Care giver support system in place?: Yes (comment) (husband)   Criminal Activity/Legal Involvement Pertinent to Current Situation/Hospitalization: No - Comment as needed  Activities of Daily Living Home Assistive Devices/Equipment: None ADL Screening (condition at time of admission) Patient's cognitive ability adequate to safely complete daily activities?: Yes Is the patient deaf or have difficulty hearing?: No Does the patient have difficulty seeing, even when wearing glasses/contacts?: No Does the patient have difficulty concentrating, remembering, or making decisions?: No Patient able to express need for assistance with ADLs?: Yes Does the patient have difficulty dressing or bathing?: No Independently performs ADLs?: Yes (appropriate for developmental age) Does the patient have difficulty walking or climbing stairs?: No Weakness of Legs: None Weakness of Arms/Hands: None  Permission Sought/Granted Permission sought to share information with : Case Manager, Family Supports, Other (comment) Permission granted to share information with : Yes, Verbal Permission Granted  Share Information with NAME: Izell Riverton  Permission granted to share info w AGENCY: [employer]  Permission granted to share info w Relationship: husband     Emotional Assessment   Attitude/Demeanor/Rapport: Engaged Affect (typically observed): Accepting Orientation: : Oriented to Self, Oriented to Place, Oriented to  Time, Oriented to Situation Alcohol / Substance Use: Not Applicable Psych Involvement: No (comment)  Admission diagnosis:  Transaminitis [R74.01] Positive D dimer [R79.89] Acute respiratory failure with hypoxia (HCC) [J96.01] Pneumonia due to COVID-19 virus [U07.1, J12.82] COVID-19 [U07.1] Patient Active  Problem List   Diagnosis Date Noted   Thrombocytosis ([place]) 03/09/2020   Hyperglycemia 03/09/2020   Pneumonia due to COVID-19 virus 03/03/2020    Depression with anxiety 03/03/2020   Sepsis ([place]) 03/03/2020   Acute respiratory failure with hypoxia (HCC)    Chronic migraine without aura without status migrainosus, not intractable 02/11/2020   Anxiety 10/23/2019   Dizziness 07/02/2019   Tremor of both hands 03/27/2018   Syncopal episodes 09/10/2017   HTN (hypertension) 05/09/2017   Menorrhagia 11/11/2016   Alopecia 03/02/2016   Bilateral cataracts 08/14/2015   Acne 10/21/2014   Liver lesion 09/09/2014   Vitamin D deficiency 09/09/2014   Hot flashes 06/10/2014   Chronic pain syndrome 05/20/2014   Vitamin B12 deficiency 03/12/2014   Contraception management 09/20/2013   IBS (irritable bowel syndrome) 09/20/2013   Unspecified vitamin D deficiency 04/03/2013   Dyslipidemia 04/03/2013   Osteopenia 04/03/2013   POTS (postural orthostatic tachycardia syndrome) 02/26/2013   Migraine 11/29/2012   Allergic rhinitis 10/05/2012   Mixed bipolar I disorder in remission ([place]) 10/05/2012   Combined fat and carbohydrate induced hyperlipemia 10/05/2012   Fibromyalgia    Bipolar disorder ([place])    Fatigue 09/10/2011   Clinical depression 09/10/2011   Acid reflux 09/10/2011   Non-restorative sleep 09/10/2011   Disturbance in sleep behavior 09/10/2011   PCP:  Cletis Athens, MD Pharmacy:   [place], [place] - [place] [address] Phone: [phone] Fax: [phone]     Social Determinants of Health (SDOH) Interventions    Readmission Risk Interventions Readmission Risk Prevention Plan 03/12/2020  Transportation Screening Complete  PCP or Specialist Appt within 5-7 Days Complete  Home Care Screening Complete  Medication Review (RN CM) Complete  Some recent data might be hidden

## 2020-03-12 NOTE — Progress Notes (Signed)
MD and SW made aware of o2 qualifications

## 2020-03-12 NOTE — Progress Notes (Signed)
Pt being discharged at this time, no complaints noted, o2 in place

## 2020-03-13 ENCOUNTER — Other Ambulatory Visit: Payer: Self-pay

## 2020-03-13 NOTE — Patient Outreach (Signed)
[facility]) Care Management  03/13/2020  JORA GALLUZZO [birth year] [id number]     Transition of Care Referral  Referral Date: 03/13/2020 Referral Source: [facility] Discharge Report Date of Discharge:03/12/2020   Facility: [place]    Referral received. Transition of care calls being completed via EMMI-automated calls. RN CM will outreach patient for any red flags received.     Plan: RN CM will close case at this time.    Enzo Montgomery, RN,BSN,CCM [place] Management Telephonic Care Management Coordinator Direct Phone: [phone] Toll Free: [phone] Fax: [phone]

## 2020-03-18 ENCOUNTER — Other Ambulatory Visit: Payer: Self-pay

## 2020-03-18 ENCOUNTER — Ambulatory Visit (INDEPENDENT_AMBULATORY_CARE_PROVIDER_SITE_OTHER): Payer: Medicare HMO | Admitting: Internal Medicine

## 2020-03-18 ENCOUNTER — Encounter: Payer: Self-pay | Admitting: Internal Medicine

## 2020-03-18 VITALS — BP 104/68 | HR 111 | Ht 69.0 in | Wt 149.0 lb

## 2020-03-18 DIAGNOSIS — F419 Anxiety disorder, unspecified: Secondary | ICD-10-CM

## 2020-03-18 DIAGNOSIS — E785 Hyperlipidemia, unspecified: Secondary | ICD-10-CM

## 2020-03-18 DIAGNOSIS — M797 Fibromyalgia: Secondary | ICD-10-CM

## 2020-03-18 DIAGNOSIS — I10 Essential (primary) hypertension: Secondary | ICD-10-CM

## 2020-03-18 DIAGNOSIS — Z8616 Personal history of COVID-19: Secondary | ICD-10-CM | POA: Diagnosis not present

## 2020-03-18 NOTE — Assessment & Plan Note (Signed)
Fibromyalgia stable on Wellbutrin.  She was advised to walk on a daily basis.  And do some aerobic exercises

## 2020-03-18 NOTE — Assessment & Plan Note (Signed)
Patient does not want to go on statin therapy several previous medicine has been tried but failed.

## 2020-03-18 NOTE — Assessment & Plan Note (Signed)
-   Today, the patient's blood pressure is well managed on verapamil  - The patient will continue the current treatment regimen.  - I encouraged the patient to eat a low-sodium diet to help control blood pressure. - I encouraged the patient to live an active lifestyle and complete activities that increases heart rate to 85% target heart rate at least 5 times per week for one hour.

## 2020-03-18 NOTE — Progress Notes (Signed)
Established Patient Office Visit  SUBJECTIVE:  Subjective  Patient ID: Melissa [name], female    DOB: 07-May-1971  Age: 49 y.o. MRN: [id number]  CC:  Chief Complaint  Patient presents with  . Hospitalization Follow-up    Patient is here today from the hospital for follow up from post covid     HPI Melissa [name] is a 49 y.o. female presenting today for hospital follow up.   She was admitted to [facility] from 03/03/2020 to 03/12/2020 with severe cough and decreased oxygenation. She was diagnosed with COVID19 on arrival with testing. Chest xray on 03/03/2020 revealed Focal airspace opacity felt to represent focus of pneumonia left base. Subtle atelectasis right base and left mid lung regions in right base. Her admission was based on acute hypoxic respiratory failure secondary to COVID19 pneumonia.   She notes that she gets tired easily. She plans on getting the COVID19 vaccine in a month or so once she feels better. She notes that she had originally planned on getting it, but kept putting it off for other things.    Past Medical History:  Diagnosis Date  . Arthritis   . Bipolar disorder ([place])   . Fibromyalgia    on disability  . Migraine   . Muscle spasm   . Pain of right side of body    chronic  . Panic attack   . POTS (postural orthostatic tachycardia syndrome)   . Syncope    recurrent  . Uterine perforation    hx of    Past Surgical History:  Procedure Laterality Date  . CATARACT EXTRACTION  dec 2014  . DILATION AND CURETTAGE OF UTERUS    . LASIK  dec 2014    Family History  Problem Relation Age of Onset  . Diabetes Mother   . Hypertension Mother   . Brain cancer Father   . Ovarian cancer Maternal Grandmother        metastasized  . Lung cancer Maternal Grandfather   . Cancer Paternal Grandmother        not sure of the type  . Cancer Paternal Grandfather        not sure of the type  . Migraines Neg Hx     Social History   Socioeconomic History  . Marital  status: Married    Spouse name: Izell [place]  . Number of children: 0  . Years of education: some college  . Highest education level: Not on file  Occupational History    Employer: OTHER    Comment: disabled  Tobacco Use  . Smoking status: Never Smoker  . Smokeless tobacco: Never Used  Vaping Use  . Vaping Use: Never used  Substance and Sexual Activity  . Alcohol use: No    Alcohol/week: 0.0 standard drinks  . Drug use: No  . Sexual activity: Yes    Birth control/protection: Surgical    Comment: also on OCPs  Other Topics Concern  . Not on file  Social History Narrative   08/28/19   From: [place], moved because her husband was from [facility]   Living: with husband Izell Bolt, since 1990   Work: disability due to fibromyalgia and bipolar disorder      Family: mother in law is nearby, her mother is in [place]; has a sister      Enjoys: crochet, baking, reading      Exercise: walking the dog, yoga - tries to do 3 times a week   Diet: not great, more sweets than  she should      Safety   Seat belts: Yes    Guns: Yes  and secure   Safe in relationships: Yes          Right handed   Caffeine: none    Social Determinants of Health   Financial Resource Strain:   . Difficulty of Paying Living Expenses: Not on file  Food Insecurity:   . Worried About Charity fundraiser in the Last Year: Not on file  . Ran Out of Food in the Last Year: Not on file  Transportation Needs:   . Lack of Transportation (Medical): Not on file  . Lack of Transportation (Non-Medical): Not on file  Physical Activity:   . Days of Exercise per Week: Not on file  . Minutes of Exercise per Session: Not on file  Stress:   . Feeling of Stress : Not on file  Social Connections:   . Frequency of Communication with Friends and Family: Not on file  . Frequency of Social Gatherings with Friends and Family: Not on file  . Attends Religious Services: Not on file  . Active Member of Clubs or Organizations: Not on file  .  Attends Archivist Meetings: Not on file  . Marital Status: Not on file  Intimate Partner Violence:   . Fear of Current or Ex-Partner: Not on file  . Emotionally Abused: Not on file  . Physically Abused: Not on file  . Sexually Abused: Not on file     Current Outpatient Medications:  .  acetaminophen (TYLENOL) 500 MG tablet, Take 1,000 mg by mouth daily as needed for mild pain., Disp: , Rfl:  .  ascorbic acid (VITAMIN C) 500 MG tablet, Take 1 tablet (500 mg total) by mouth daily., Disp: 30 tablet, Rfl: 0 .  buPROPion (WELLBUTRIN XL) 150 MG 24 hr tablet, Take 1 tablet (150 mg total) by mouth daily., Disp: 90 tablet, Rfl: 3 .  cholecalciferol (VITAMIN D) 1000 units tablet, Take 5,000 Units by mouth daily., Disp: , Rfl:  .  clonazePAM (KLONOPIN) 1 MG tablet, Take 1 tablet (1 mg total) by mouth 2 (two) times daily as needed. for anxiety, Disp: 60 tablet, Rfl: 0 .  cyclobenzaprine (FLEXERIL) 10 MG tablet, Take 10 mg by mouth 3 (three) times daily as needed for muscle spasms. , Disp: , Rfl:  .  dexamethasone (DECADRON) 6 MG tablet, Take 1 tablet (6 mg total) by mouth daily for 5 days. Steroid to calm inflammation., Disp: 5 tablet, Rfl: 0 .  fluvoxaMINE (LUVOX) 50 MG tablet, Take 50-100 mg by mouth at bedtime. , Disp: , Rfl:  .  gabapentin (NEURONTIN) 600 MG tablet, Take 1 tablet (600 mg total) by mouth 3 (three) times daily., Disp: 270 tablet, Rfl: 3 .  Galcanezumab-gnlm (EMGALITY) 120 MG/ML SOAJ, Inject 120 mg into the skin every 30 (thirty) days., Disp: 1 mL, Rfl: 11 .  ipratropium (ATROVENT HFA) 17 MCG/ACT inhaler, Inhale 2 puffs into the lungs 4 (four) times daily for 10 days., Disp: 1 each, Rfl: 12 .  norgestimate-ethinyl estradiol (SPRINTEC 28) 0.25-35 MG-MCG tablet, Take 1 tablet by mouth daily., Disp: 6 Package, Rfl: 1 .  rizatriptan (MAXALT-MLT) 10 MG disintegrating tablet, Take 1 tablet (10 mg total) by mouth as needed for migraine. May repeat in 2 hours if needed, Disp: 9  tablet, Rfl: 11 .  verapamil (CALAN-SR) 120 MG CR tablet, Hold this until followup with outpatient doctor because your blood pressure has been normal  without it in the hospital., Disp: 90 tablet, Rfl: 3 .  vitamin B-12 (CYANOCOBALAMIN) 1000 MCG tablet, Take 1,000 mcg by mouth daily., Disp: , Rfl:  .  zinc sulfate 220 (50 Zn) MG capsule, Take 1 capsule (220 mg total) by mouth daily., Disp: 30 capsule, Rfl: 0   Allergies  Allergen Reactions  . Lamotrigine Hives and Swelling  . Abilify [Aripiprazole] Other (See Comments)    Weight gain, did not help,   . Azithromycin Rash  . Depakote [Divalproex Sodium] Other (See Comments)    Hair loss  . Erythromycin Base Swelling  . Lithium Rash  . Penicillins Rash    Has patient had a PCN reaction causing immediate rash, facial/tongue/throat swelling, SOB or lightheadedness with hypotension: Yes- swelling, rash  Has patient had a PCN reaction causing severe rash involving mucus membranes or skin necrosis: Yes Has patient had a PCN reaction that required hospitalization No Has patient had a PCN reaction occurring within the last 10 years: No If all of the above answers are "NO", then may proceed with Cephalosporin use.   . Pregabalin Palpitations    Manic symptoms, no sleep x's 3 days, no appetite.  . Seroquel [Quetiapine Fumarate] Other (See Comments)    Irritable  . Sulfa Antibiotics Rash  . Tetanus Toxoids Other (See Comments)    Swelling to injection site  . Tramadol Nausea And Vomiting  . Ziprasidone Hcl Other (See Comments)    Heat/cold intolerance.     ROS Review of Systems  Constitutional: Positive for fatigue.  HENT: Negative.   Eyes: Negative.   Respiratory: Positive for shortness of breath (3L, Oxygen drops to low 90% on exertion).   Cardiovascular: Negative.   Gastrointestinal: Negative.  Negative for diarrhea (3 days, resolved).  Endocrine: Negative.   Genitourinary: Negative.   Musculoskeletal: Negative.   Skin: Negative.     Allergic/Immunologic: Negative.   Neurological: Negative.  Tremors:    Hematological: Negative.   Psychiatric/Behavioral: Positive for sleep disturbance.  All other systems reviewed and are negative.    OBJECTIVE:    Physical Exam Vitals reviewed.  Constitutional:      Appearance: Normal appearance.     Comments: On 3L oxygen  HENT:     Mouth/Throat:     Mouth: Mucous membranes are moist.  Eyes:     Pupils: Pupils are equal, round, and reactive to light.  Neck:     Vascular: No carotid bruit.  Cardiovascular:     Rate and Rhythm: Normal rate and regular rhythm.     Pulses: Normal pulses.     Heart sounds: Normal heart sounds.  Pulmonary:     Effort: Pulmonary effort is normal.     Breath sounds: Normal breath sounds.  Abdominal:     General: Bowel sounds are normal.     Palpations: Abdomen is soft. There is no hepatomegaly, splenomegaly or mass.     Tenderness: There is no abdominal tenderness.     Hernia: No hernia is present.  Musculoskeletal:        General: No tenderness.     Cervical back: Neck supple.     Right lower leg: No edema.     Left lower leg: No edema.  Skin:    Findings: No rash.  Neurological:     Mental Status: She is alert and oriented to person, place, and time.     Motor: No weakness.  Psychiatric:        Mood and Affect: Mood and affect  normal.        Behavior: Behavior normal.     BP 104/68   Pulse (!) 111   Ht 5\' 9"  (1.753 m)   Wt 149 lb (67.6 kg)   LMP 02/20/2020 (Approximate)   SpO2 93% Comment: Pt is on 2L  BMI 22.00 kg/m  Wt Readings from Last 3 Encounters:  03/18/20 149 lb (67.6 kg)  03/03/20 154 lb 15.7 oz (70.3 kg)  02/11/20 160 lb (72.6 kg)    Health Maintenance Due  Topic Date Due  . Hepatitis C Screening  Never done  . COLONOSCOPY  Never done  . INFLUENZA VACCINE  Never done    There are no preventive care reminders to display for this patient.  CBC Latest Ref Rng & Units 03/12/2020 03/11/2020 03/10/2020  WBC  4.0 - 10.5 K/uL 12.2(H) 10.9(H) 12.9(H)  Hemoglobin 12.0 - 15.0 g/dL 11.9(L) 11.9(L) 12.1  Hematocrit 36 - 46 % 34.7(L) 35.8(L) 36.9  Platelets 150 - 400 K/uL 628(H) 635(H) 638(H)   CMP Latest Ref Rng & Units 03/12/2020 03/11/2020 03/10/2020  Glucose 70 - 99 mg/dL 81 120(H) 147(H)  BUN 6 - 20 mg/dL 18 18 19   Creatinine 0.44 - 1.00 mg/dL 0.81 0.83 0.99  Sodium 135 - 145 mmol/L 140 137 138  Potassium 3.5 - 5.1 mmol/L 3.8 4.2 4.2  Chloride 98 - 111 mmol/L 102 101 102  CO2 22 - 32 mmol/L 27 28 28   Calcium 8.9 - 10.3 mg/dL 8.1(L) 8.0(L) 8.3(L)  Total Protein 6.5 - 8.1 g/dL - - -  Total Bilirubin 0.3 - 1.2 mg/dL - - -  Alkaline Phos 38 - 126 U/L - - -  AST 15 - 41 U/L - - -  ALT 0 - 44 U/L - - -    Lab Results  Component Value Date   TSH 0.98 05/09/2017   Lab Results  Component Value Date   ALBUMIN 2.7 (L) 03/07/2020   ANIONGAP 11 03/12/2020   GFR 68.49 05/09/2017   Lab Results  Component Value Date   CHOL 245 (H) 06/24/2016   CHOL 245 (H) 02/26/2013   CHOL 203 (H) 02/04/2012   HDL 40.20 06/24/2016   HDL 34.20 (L) 02/26/2013   HDL 29 (L) 02/04/2012   LDLCALC 114 (H) 02/04/2012   CHOLHDL 6 06/24/2016   CHOLHDL 7 02/26/2013   Lab Results  Component Value Date   TRIG 330 (H) 03/03/2020   No results found for: HGBA1C    ASSESSMENT & PLAN:   Problem List Items Addressed This Visit      Cardiovascular and Mediastinum   Essential hypertension - Primary    - Today, the patient's blood pressure is well managed on verapamil  - The patient will continue the current treatment regimen.  - I encouraged the patient to eat a low-sodium diet to help control blood pressure. - I encouraged the patient to live an active lifestyle and complete activities that increases heart rate to 85% target heart rate at least 5 times per week for one hour.            Other   Fibromyalgia    Fibromyalgia stable on Wellbutrin.  She was advised to walk on a daily basis.  And do some aerobic  exercises      Dyslipidemia    Patient does not want to go on statin therapy several previous medicine has been tried but failed.      Anxiety    - Patient experiencing high levels  of anxiety.  - Encouraged patient to engage in relaxing activities like yoga, meditation, journaling, going for a walk, or participating in a hobby.  - Encouraged patient to reach out to trusted friends or family members about recent struggles          No orders of the defined types were placed in this encounter.   Follow-up: No follow-ups on file.    Cletis Athens, MD [facility] [address] [place], [place] [address]   By signing my name below, I, [employer], attest that this documentation has been prepared under the direction and in the presence of Dr. Cletis Athens Electronically Signed: Cletis Athens, MD 03/18/20, 6:08 PM  I personally performed the services described in this documentation, which was SCRIBED in my presence. The recorded information has been reviewed and considered accurate. It has been edited as necessary during review. Cletis Athens, MD

## 2020-03-18 NOTE — Assessment & Plan Note (Signed)
-   Patient experiencing high levels of anxiety.  - Encouraged patient to engage in relaxing activities like yoga, meditation, journaling, going for a walk, or participating in a hobby.  - Encouraged patient to reach out to trusted friends or family members about recent struggles 

## 2020-03-19 NOTE — Addendum Note (Signed)
Addended by: Cletis Athens on: 03/19/2020 01:11 PM   Modules accepted: Level of Service

## 2020-03-25 ENCOUNTER — Other Ambulatory Visit: Payer: Self-pay

## 2020-03-25 ENCOUNTER — Encounter: Payer: Self-pay | Admitting: Internal Medicine

## 2020-03-25 ENCOUNTER — Ambulatory Visit (INDEPENDENT_AMBULATORY_CARE_PROVIDER_SITE_OTHER): Payer: Medicare HMO | Admitting: Internal Medicine

## 2020-03-25 VITALS — BP 134/84 | HR 126 | Ht 69.0 in | Wt 152.0 lb

## 2020-03-25 DIAGNOSIS — F419 Anxiety disorder, unspecified: Secondary | ICD-10-CM | POA: Diagnosis not present

## 2020-03-25 DIAGNOSIS — J1282 Pneumonia due to coronavirus disease 2019: Secondary | ICD-10-CM | POA: Diagnosis not present

## 2020-03-25 DIAGNOSIS — M797 Fibromyalgia: Secondary | ICD-10-CM | POA: Diagnosis not present

## 2020-03-25 DIAGNOSIS — I10 Essential (primary) hypertension: Secondary | ICD-10-CM

## 2020-03-25 DIAGNOSIS — U071 COVID-19: Secondary | ICD-10-CM | POA: Diagnosis not present

## 2020-03-25 DIAGNOSIS — K219 Gastro-esophageal reflux disease without esophagitis: Secondary | ICD-10-CM

## 2020-03-25 NOTE — Assessment & Plan Note (Signed)
Patient take Flexeril for fibromyalgia also sees psychiatrist by virtual visit once a month.  Patient is doing well and maintaining she wants to go back to work so I told her she can go to work at least once a week.

## 2020-03-25 NOTE — Progress Notes (Signed)
Established Patient Office Visit  SUBJECTIVE:  Subjective  Patient ID: Melissa [name], female    DOB: 1970-08-20  Age: 49 y.o. MRN: [id number]  CC:  Chief Complaint  Patient presents with   Follow-up    Patient is here today for a 1 week follow up from prior ov    HPI Melissa [name] is a 49 y.o. female presenting today for follow up.   She has been feeling better since her last visit. Her COVID19 diagnosis was made 03/03/2020. She notes that when she is out of the house, she reduces her oxygen to 2L because she has limited oxygen; she declines difficulty with this change. She also states that she doesn't feel like she needs a full 3L of oxygen at home. She does have an inhaler and makes use of it twice per day.   She has issues with her acid reflux, but it is managed with medication.   She would like to go back to work one day per week.    Past Medical History:  Diagnosis Date   Arthritis    Bipolar disorder ([place])    Fibromyalgia    on disability   Migraine    Muscle spasm    Pain of right side of body    chronic   Panic attack    POTS (postural orthostatic tachycardia syndrome)    Syncope    recurrent   Uterine perforation    hx of    Past Surgical History:  Procedure Laterality Date   CATARACT EXTRACTION  dec 2014   DILATION AND CURETTAGE OF UTERUS     LASIK  dec 2014    Family History  Problem Relation Age of Onset   Diabetes Mother    Hypertension Mother    Brain cancer Father    Ovarian cancer Maternal Grandmother        metastasized   Lung cancer Maternal Grandfather    Cancer Paternal Grandmother        not sure of the type   Cancer Paternal Grandfather        not sure of the type   Migraines Neg Hx     Social History   Socioeconomic History   Marital status: Married    Spouse name: Izell [place]   Number of children: 0   Years of education: some college   Highest education level: Not on file  Occupational History     Employer: OTHER    Comment: disabled  Tobacco Use   Smoking status: Never Smoker   Smokeless tobacco: Never Used  Scientific laboratory technician Use: Never used  Substance and Sexual Activity   Alcohol use: No    Alcohol/week: 0.0 standard drinks   Drug use: No   Sexual activity: Yes    Birth control/protection: Surgical    Comment: also on OCPs  Other Topics Concern   Not on file  Social History Narrative   08/28/19   From: [place], moved because her husband was from [place]   Living: with husband Izell [place], since 1990   Work: disability due to fibromyalgia and bipolar disorder      Family: mother in law is nearby, her mother is in [place]; has a sister      Enjoys: crochet, baking, reading      Exercise: walking the dog, yoga - tries to do 3 times a week   Diet: not great, more sweets than she should      Safety  Seat belts: Yes    Guns: Yes  and secure   Safe in relationships: Yes          Right handed   Caffeine: none    Social Determinants of Health   Financial Resource Strain:    Difficulty of Paying Living Expenses: Not on file  Food Insecurity:    Worried About Charity fundraiser in the Last Year: Not on file   [employer] of Food in the Last Year: Not on file  Transportation Needs:    Lack of Transportation (Medical): Not on file   Lack of Transportation (Non-Medical): Not on file  Physical Activity:    Days of Exercise per Week: Not on file   Minutes of Exercise per Session: Not on file  Stress:    Feeling of Stress : Not on file  Social Connections:    Frequency of Communication with Friends and Family: Not on file   Frequency of Social Gatherings with Friends and Family: Not on file   Attends Religious Services: Not on file   Active Member of Clubs or Organizations: Not on file   Attends Archivist Meetings: Not on file   Marital Status: Not on file  Intimate Partner Violence:    Fear of Current or Ex-Partner: Not on file   Emotionally  Abused: Not on file   Physically Abused: Not on file   Sexually Abused: Not on file     Current Outpatient Medications:    acetaminophen (TYLENOL) 500 MG tablet, Take 1,000 mg by mouth daily as needed for mild pain., Disp: , Rfl:    ascorbic acid (VITAMIN C) 500 MG tablet, Take 1 tablet (500 mg total) by mouth daily., Disp: 30 tablet, Rfl: 0   buPROPion (WELLBUTRIN XL) 150 MG 24 hr tablet, Take 1 tablet (150 mg total) by mouth daily., Disp: 90 tablet, Rfl: 3   cholecalciferol (VITAMIN D) 1000 units tablet, Take 5,000 Units by mouth daily., Disp: , Rfl:    clonazePAM (KLONOPIN) 1 MG tablet, Take 1 tablet (1 mg total) by mouth 2 (two) times daily as needed. for anxiety, Disp: 60 tablet, Rfl: 0   cyclobenzaprine (FLEXERIL) 10 MG tablet, Take 10 mg by mouth 3 (three) times daily as needed for muscle spasms. , Disp: , Rfl:    fluvoxaMINE (LUVOX) 50 MG tablet, Take 50-100 mg by mouth at bedtime. , Disp: , Rfl:    gabapentin (NEURONTIN) 600 MG tablet, Take 1 tablet (600 mg total) by mouth 3 (three) times daily., Disp: 270 tablet, Rfl: 3   Galcanezumab-gnlm (EMGALITY) 120 MG/ML SOAJ, Inject 120 mg into the skin every 30 (thirty) days., Disp: 1 mL, Rfl: 11   norgestimate-ethinyl estradiol (SPRINTEC 28) 0.25-35 MG-MCG tablet, Take 1 tablet by mouth daily., Disp: 6 Package, Rfl: 1   rizatriptan (MAXALT-MLT) 10 MG disintegrating tablet, Take 1 tablet (10 mg total) by mouth as needed for migraine. May repeat in 2 hours if needed, Disp: 9 tablet, Rfl: 11   verapamil (CALAN-SR) 120 MG CR tablet, Hold this until followup with outpatient doctor because your blood pressure has been normal without it in the hospital., Disp: 90 tablet, Rfl: 3   vitamin B-12 (CYANOCOBALAMIN) 1000 MCG tablet, Take 1,000 mcg by mouth daily., Disp: , Rfl:    zinc sulfate 220 (50 Zn) MG capsule, Take 1 capsule (220 mg total) by mouth daily., Disp: 30 capsule, Rfl: 0   ipratropium (ATROVENT HFA) 17 MCG/ACT inhaler,  Inhale 2 puffs into the  lungs 4 (four) times daily for 10 days., Disp: 1 each, Rfl: 12   Allergies  Allergen Reactions   Lamotrigine Hives and Swelling   Abilify [Aripiprazole] Other (See Comments)    Weight gain, did not help,    Azithromycin Rash   Depakote [Divalproex Sodium] Other (See Comments)    Hair loss   Erythromycin Base Swelling   Lithium Rash   Penicillins Rash    Has patient had a PCN reaction causing immediate rash, facial/tongue/throat swelling, SOB or lightheadedness with hypotension: Yes- swelling, rash  Has patient had a PCN reaction causing severe rash involving mucus membranes or skin necrosis: Yes Has patient had a PCN reaction that required hospitalization No Has patient had a PCN reaction occurring within the last 10 years: No If all of the above answers are "NO", then may proceed with Cephalosporin use.    Pregabalin Palpitations    Manic symptoms, no sleep x's 3 days, no appetite.   Seroquel [Quetiapine Fumarate] Other (See Comments)    Irritable   Sulfa Antibiotics Rash   Tetanus Toxoids Other (See Comments)    Swelling to injection site   Tramadol Nausea And Vomiting   Ziprasidone Hcl Other (See Comments)    Heat/cold intolerance.     ROS Review of Systems  Constitutional: Positive for fatigue.  HENT: Negative.   Eyes: Negative.   Respiratory: Negative.  Negative for cough, chest tightness and shortness of breath (on 2-3L oxygen).   Cardiovascular: Negative.   Gastrointestinal: Negative.   Endocrine: Negative.   Genitourinary: Negative.   Musculoskeletal: Positive for myalgias.  Skin: Negative.   Allergic/Immunologic: Negative.   Neurological: Negative.   Hematological: Negative.   Psychiatric/Behavioral: Negative.        Bipolar, stable; managed by psychiatry  All other systems reviewed and are negative.    OBJECTIVE:    Physical Exam Vitals reviewed.  Constitutional:      Appearance: Normal appearance.     Comments:  On 2L Oxygen  HENT:     Mouth/Throat:     Mouth: Mucous membranes are moist.  Eyes:     Pupils: Pupils are equal, round, and reactive to light.  Neck:     Vascular: No carotid bruit.  Cardiovascular:     Rate and Rhythm: Normal rate and regular rhythm.     Pulses: Normal pulses.     Heart sounds: Normal heart sounds.  Pulmonary:     Effort: Pulmonary effort is normal.     Breath sounds: Normal breath sounds.  Abdominal:     General: Bowel sounds are normal.     Palpations: Abdomen is soft. There is no hepatomegaly, splenomegaly or mass.     Tenderness: There is no abdominal tenderness.     Hernia: No hernia is present.  Musculoskeletal:        General: No tenderness.     Cervical back: Neck supple.     Right lower leg: No edema.     Left lower leg: No edema.  Skin:    Findings: No rash.  Neurological:     Mental Status: She is alert and oriented to person, place, and time.     Motor: No weakness.  Psychiatric:        Mood and Affect: Mood and affect normal.        Behavior: Behavior normal.     BP 134/84    Pulse (!) 126    Ht 5\' 9"  (1.753 m)    Wt 152 lb (  68.9 kg)    SpO2 96% Comment: Patient is on 2L in office today   BMI 22.45 kg/m  Wt Readings from Last 3 Encounters:  03/25/20 152 lb (68.9 kg)  03/18/20 149 lb (67.6 kg)  03/03/20 154 lb 15.7 oz (70.3 kg)    Health Maintenance Due  Topic Date Due   Hepatitis C Screening  Never done   COLONOSCOPY  Never done   INFLUENZA VACCINE  Never done    There are no preventive care reminders to display for this patient.  CBC Latest Ref Rng & Units 03/12/2020 03/11/2020 03/10/2020  WBC 4.0 - 10.5 K/uL 12.2(H) 10.9(H) 12.9(H)  Hemoglobin 12.0 - 15.0 g/dL 11.9(L) 11.9(L) 12.1  Hematocrit 36 - 46 % 34.7(L) 35.8(L) 36.9  Platelets 150 - 400 K/uL 628(H) 635(H) 638(H)   CMP Latest Ref Rng & Units 03/12/2020 03/11/2020 03/10/2020  Glucose 70 - 99 mg/dL 81 120(H) 147(H)  BUN 6 - 20 mg/dL 18 18 19   Creatinine 0.44 - 1.00 mg/dL  0.81 0.83 0.99  Sodium 135 - 145 mmol/L 140 137 138  Potassium 3.5 - 5.1 mmol/L 3.8 4.2 4.2  Chloride 98 - 111 mmol/L 102 101 102  CO2 22 - 32 mmol/L 27 28 28   Calcium 8.9 - 10.3 mg/dL 8.1(L) 8.0(L) 8.3(L)  Total Protein 6.5 - 8.1 g/dL - - -  Total Bilirubin 0.3 - 1.2 mg/dL - - -  Alkaline Phos 38 - 126 U/L - - -  AST 15 - 41 U/L - - -  ALT 0 - 44 U/L - - -    Lab Results  Component Value Date   TSH 0.98 05/09/2017   Lab Results  Component Value Date   ALBUMIN 2.7 (L) 03/07/2020   ANIONGAP 11 03/12/2020   GFR 68.49 05/09/2017   Lab Results  Component Value Date   CHOL 245 (H) 06/24/2016   CHOL 245 (H) 02/26/2013   CHOL 203 (H) 02/04/2012   HDL 40.20 06/24/2016   HDL 34.20 (L) 02/26/2013   HDL 29 (L) 02/04/2012   LDLCALC 114 (H) 02/04/2012   CHOLHDL 6 06/24/2016   CHOLHDL 7 02/26/2013   Lab Results  Component Value Date   TRIG 330 (H) 03/03/2020   No results found for: HGBA1C    ASSESSMENT & PLAN:   Problem List Items Addressed This Visit      Cardiovascular and Mediastinum   Essential hypertension - Primary    - Today, the patient's blood pressure is well managed on verapamil. - The patient will continue the current treatment regimen.  - I encouraged the patient to eat a low-sodium diet to help control blood pressure. - I encouraged the patient to live an active lifestyle and complete activities that increases heart rate to 85% target heart rate at least 5 times per week for one hour.            Respiratory   Pneumonia due to COVID-19 virus    Patient still have residual crackers in the lungs no rhonchi.  She was advised to continue using her oxygen to monitor and slowly wean her off the oxygen within a month..  She was also advised to continue her bronchodilator therapy.        Digestive   Gastroesophageal reflux disease without esophagitis    GERD is stable on present medication.  She denies any history of dysphagia no history of black stool.          Other   Fibromyalgia  Patient take Flexeril for fibromyalgia also sees psychiatrist by virtual visit once a month.  Patient is doing well and maintaining she wants to go back to work so I told her she can go to work at least once a week.      Anxiety    - Patient experiencing high levels of anxiety.  - Encouraged patient to engage in relaxing activities like yoga, meditation, journaling, going for a walk, or participating in a hobby.  - Encouraged patient to reach out to trusted friends or family members about recent struggles          No orders of the defined types were placed in this encounter.   Follow-up: No follow-ups on file.    Cletis Athens, MD [facility] [address] [place],  [address]   By signing my name below, I, [employer], attest that this documentation has been prepared under the direction and in the presence of Dr. Cletis Athens Electronically Signed: Cletis Athens, MD 03/25/20, 9:46 AM  I personally performed the services described in this documentation, which was SCRIBED in my presence. The recorded information has been reviewed and considered accurate. It has been edited as necessary during review. Cletis Athens, MD

## 2020-03-25 NOTE — Assessment & Plan Note (Signed)
-   Today, the patient's blood pressure is well managed on verapamil. - The patient will continue the current treatment regimen.  - I encouraged the patient to eat a low-sodium diet to help control blood pressure. - I encouraged the patient to live an active lifestyle and complete activities that increases heart rate to 85% target heart rate at least 5 times per week for one hour.

## 2020-03-25 NOTE — Assessment & Plan Note (Signed)
Patient still have residual crackers in the lungs no rhonchi.  She was advised to continue using her oxygen to monitor and slowly wean her off the oxygen within a month..  She was also advised to continue her bronchodilator therapy.

## 2020-03-25 NOTE — Assessment & Plan Note (Signed)
GERD is stable on present medication.  She denies any history of dysphagia no history of black stool.

## 2020-03-25 NOTE — Assessment & Plan Note (Signed)
-   Patient experiencing high levels of anxiety.  - Encouraged patient to engage in relaxing activities like yoga, meditation, journaling, going for a walk, or participating in a hobby.  - Encouraged patient to reach out to trusted friends or family members about recent struggles 

## 2020-04-09 ENCOUNTER — Other Ambulatory Visit: Payer: Self-pay | Admitting: Internal Medicine

## 2020-04-09 DIAGNOSIS — G43801 Other migraine, not intractable, with status migrainosus: Secondary | ICD-10-CM

## 2020-04-11 DIAGNOSIS — U071 COVID-19: Secondary | ICD-10-CM | POA: Diagnosis not present

## 2020-04-21 ENCOUNTER — Ambulatory Visit (INDEPENDENT_AMBULATORY_CARE_PROVIDER_SITE_OTHER): Payer: Medicare HMO | Admitting: Internal Medicine

## 2020-04-21 ENCOUNTER — Encounter: Payer: Self-pay | Admitting: Internal Medicine

## 2020-04-21 ENCOUNTER — Other Ambulatory Visit: Payer: Self-pay

## 2020-04-21 VITALS — BP 130/100 | HR 103 | Ht 69.0 in | Wt 153.6 lb

## 2020-04-21 DIAGNOSIS — I10 Essential (primary) hypertension: Secondary | ICD-10-CM | POA: Diagnosis not present

## 2020-04-21 DIAGNOSIS — R251 Tremor, unspecified: Secondary | ICD-10-CM | POA: Diagnosis not present

## 2020-04-21 DIAGNOSIS — F419 Anxiety disorder, unspecified: Secondary | ICD-10-CM | POA: Diagnosis not present

## 2020-04-21 DIAGNOSIS — G43709 Chronic migraine without aura, not intractable, without status migrainosus: Secondary | ICD-10-CM

## 2020-04-21 DIAGNOSIS — D75839 Thrombocytosis, unspecified: Secondary | ICD-10-CM

## 2020-04-21 DIAGNOSIS — E785 Hyperlipidemia, unspecified: Secondary | ICD-10-CM | POA: Diagnosis not present

## 2020-04-21 NOTE — Progress Notes (Signed)
Established Patient Office Visit  Subjective:  Patient ID: Melissa [name], female    DOB: July 31, 1970  Age: 49 y.o. MRN: [id number]  CC:  Chief Complaint  Patient presents with  . Hypertension    HPI  [name] presents for check up.  Patient is known to have anxiety with fibromyalgia bipolar disorder.  She sees a psychiatrist for her medication.  Past Medical History:  Diagnosis Date  . Arthritis   . Bipolar disorder ([place])   . Fibromyalgia    on disability  . Migraine   . Muscle spasm   . Pain of right side of body    chronic  . Panic attack   . POTS (postural orthostatic tachycardia syndrome)   . Syncope    recurrent  . Uterine perforation    hx of    Past Surgical History:  Procedure Laterality Date  . CATARACT EXTRACTION  dec 2014  . DILATION AND CURETTAGE OF UTERUS    . LASIK  dec 2014    Family History  Problem Relation Age of Onset  . Diabetes Mother   . Hypertension Mother   . Brain cancer Father   . Ovarian cancer Maternal Grandmother        metastasized  . Lung cancer Maternal Grandfather   . Cancer Paternal Grandmother        not sure of the type  . Cancer Paternal Grandfather        not sure of the type  . Migraines Neg Hx     Social History   Socioeconomic History  . Marital status: Married    Spouse name: [name]  . Number of children: 0  . Years of education: some college  . Highest education level: Not on file  Occupational History    Employer: OTHER    Comment: disabled  Tobacco Use  . Smoking status: Never Smoker  . Smokeless tobacco: Never Used  Vaping Use  . Vaping Use: Never used  Substance and Sexual Activity  . Alcohol use: No    Alcohol/week: 0.0 standard drinks  . Drug use: No  . Sexual activity: Yes    Birth control/protection: Surgical    Comment: also on OCPs  Other Topics Concern  . Not on file  Social History Narrative   08/28/19   From: [place], moved because her husband was from [facility]   Living: with  husband Izell Schubert, since 1990   Work: disability due to fibromyalgia and bipolar disorder      Family: mother in law is nearby, her mother is in [place]; has a sister      Enjoys: crochet, baking, reading      Exercise: walking the dog, yoga - tries to do 3 times a week   Diet: not great, more sweets than she should      Safety   Seat belts: Yes    Guns: Yes  and secure   Safe in relationships: Yes          Right handed   Caffeine: none    Social Determinants of Health   Financial Resource Strain:   . Difficulty of Paying Living Expenses: Not on file  Food Insecurity:   . Worried About Charity fundraiser in the Last Year: Not on file  . Ran Out of Food in the Last Year: Not on file  Transportation Needs:   . Lack of Transportation (Medical): Not on file  . Lack of Transportation (Non-Medical): Not on file  Physical Activity:   . Days of Exercise per Week: Not on file  . Minutes of Exercise per Session: Not on file  Stress:   . Feeling of Stress : Not on file  Social Connections:   . Frequency of Communication with Friends and Family: Not on file  . Frequency of Social Gatherings with Friends and Family: Not on file  . Attends Religious Services: Not on file  . Active Member of Clubs or Organizations: Not on file  . Attends Archivist Meetings: Not on file  . Marital Status: Not on file  Intimate Partner Violence:   . Fear of Current or Ex-Partner: Not on file  . Emotionally Abused: Not on file  . Physically Abused: Not on file  . Sexually Abused: Not on file     Current Outpatient Medications:  .  acetaminophen (TYLENOL) 500 MG tablet, Take 1,000 mg by mouth daily as needed for mild pain., Disp: , Rfl:  .  buPROPion (WELLBUTRIN XL) 150 MG 24 hr tablet, Take 1 tablet (150 mg total) by mouth daily., Disp: 90 tablet, Rfl: 3 .  cholecalciferol (VITAMIN D) 1000 units tablet, Take 5,000 Units by mouth daily., Disp: , Rfl:  .  clonazePAM (KLONOPIN) 1 MG tablet,  Take 1 tablet (1 mg total) by mouth 2 (two) times daily as needed. for anxiety, Disp: 60 tablet, Rfl: 0 .  cyclobenzaprine (FLEXERIL) 10 MG tablet, Take 10 mg by mouth 3 (three) times daily as needed for muscle spasms. , Disp: , Rfl:  .  fluvoxaMINE (LUVOX) 50 MG tablet, Take 50-100 mg by mouth at bedtime. , Disp: , Rfl:  .  gabapentin (NEURONTIN) 600 MG tablet, Take 1 tablet (600 mg total) by mouth 3 (three) times daily., Disp: 270 tablet, Rfl: 3 .  Galcanezumab-gnlm (EMGALITY) 120 MG/ML SOAJ, Inject 120 mg into the skin every 30 (thirty) days., Disp: 1 mL, Rfl: 11 .  norgestimate-ethinyl estradiol (SPRINTEC 28) 0.25-35 MG-MCG tablet, Take 1 tablet by mouth daily., Disp: 6 Package, Rfl: 1 .  rizatriptan (MAXALT-MLT) 10 MG disintegrating tablet, Take 1 tablet (10 mg total) by mouth as needed for migraine. May repeat in 2 hours if needed, Disp: 9 tablet, Rfl: 11 .  verapamil (CALAN-SR) 120 MG CR tablet, Hold this until followup with outpatient doctor because your blood pressure has been normal without it in the hospital., Disp: 90 tablet, Rfl: 3 .  vitamin B-12 (CYANOCOBALAMIN) 1000 MCG tablet, Take 1,000 mcg by mouth daily., Disp: , Rfl:  .  ipratropium (ATROVENT HFA) 17 MCG/ACT inhaler, Inhale 2 puffs into the lungs 4 (four) times daily for 10 days., Disp: 1 each, Rfl: 12   Allergies  Allergen Reactions  . Lamotrigine Hives and Swelling  . Abilify [Aripiprazole] Other (See Comments)    Weight gain, did not help,   . Azithromycin Rash  . Depakote [Divalproex Sodium] Other (See Comments)    Hair loss  . Erythromycin Base Swelling  . Lithium Rash  . Penicillins Rash    Has patient had a PCN reaction causing immediate rash, facial/tongue/throat swelling, SOB or lightheadedness with hypotension: Yes- swelling, rash  Has patient had a PCN reaction causing severe rash involving mucus membranes or skin necrosis: Yes Has patient had a PCN reaction that required hospitalization No Has patient had a  PCN reaction occurring within the last 10 years: No If all of the above answers are "NO", then may proceed with Cephalosporin use.   . Pregabalin Palpitations    Manic  symptoms, no sleep x's 3 days, no appetite.  . Seroquel [Quetiapine Fumarate] Other (See Comments)    Irritable  . Sulfa Antibiotics Rash  . Tetanus Toxoids Other (See Comments)    Swelling to injection site  . Tramadol Nausea And Vomiting  . Ziprasidone Hcl Other (See Comments)    Heat/cold intolerance.     ROS Review of Systems  Constitutional: Negative.   HENT: Negative.   Eyes: Negative.   Respiratory: Negative.   Cardiovascular: Negative.   Gastrointestinal: Negative.   Endocrine: Negative.   Genitourinary: Negative.   Musculoskeletal: Negative.   Skin: Negative.   Allergic/Immunologic: Negative.   Neurological: Negative.   Hematological: Negative.   Psychiatric/Behavioral: Negative.   All other systems reviewed and are negative.     Objective:    Physical Exam Vitals reviewed.  Constitutional:      Appearance: Normal appearance.  HENT:     Mouth/Throat:     Mouth: Mucous membranes are moist.  Eyes:     Pupils: Pupils are equal, round, and reactive to light.  Neck:     Vascular: No carotid bruit.  Cardiovascular:     Rate and Rhythm: Normal rate and regular rhythm.     Pulses: Normal pulses.     Heart sounds: Normal heart sounds.  Pulmonary:     Effort: Pulmonary effort is normal.     Breath sounds: Normal breath sounds.  Abdominal:     General: Bowel sounds are normal.     Palpations: Abdomen is soft. There is no hepatomegaly, splenomegaly or mass.     Tenderness: There is no abdominal tenderness.     Hernia: No hernia is present.  Musculoskeletal:        General: No tenderness.     Cervical back: Neck supple.     Right lower leg: No edema.     Left lower leg: No edema.  Skin:    Findings: No rash.  Neurological:     Mental Status: She is alert and oriented to person, place, and  time.     Motor: No weakness.  Psychiatric:        Mood and Affect: Mood and affect normal.        Behavior: Behavior normal.     BP (!) 130/100   Pulse (!) 103   Ht 5\' 9"  (1.753 m)   Wt 153 lb 9.6 oz (69.7 kg)   SpO2 97%   BMI 22.68 kg/m  Wt Readings from Last 3 Encounters:  04/21/20 153 lb 9.6 oz (69.7 kg)  03/25/20 152 lb (68.9 kg)  03/18/20 149 lb (67.6 kg)     Health Maintenance Due  Topic Date Due  . Hepatitis C Screening  Never done  . COLONOSCOPY  Never done  . INFLUENZA VACCINE  Never done    There are no preventive care reminders to display for this patient.  Lab Results  Component Value Date   TSH 0.98 05/09/2017   Lab Results  Component Value Date   WBC 12.2 (H) 03/12/2020   HGB 11.9 (L) 03/12/2020   HCT 34.7 (L) 03/12/2020   MCV 85.3 03/12/2020   PLT 628 (H) 03/12/2020   Lab Results  Component Value Date   NA 140 03/12/2020   K 3.8 03/12/2020   CO2 27 03/12/2020   GLUCOSE 81 03/12/2020   BUN 18 03/12/2020   CREATININE 0.81 03/12/2020   BILITOT 0.8 03/07/2020   ALKPHOS 68 03/07/2020   AST 62 (H) 03/07/2020  ALT 75 (H) 03/07/2020   PROT 6.2 (L) 03/07/2020   ALBUMIN 2.7 (L) 03/07/2020   CALCIUM 8.1 (L) 03/12/2020   ANIONGAP 11 03/12/2020   GFR 68.49 05/09/2017   Lab Results  Component Value Date   CHOL 245 (H) 06/24/2016   Lab Results  Component Value Date   HDL 40.20 06/24/2016   Lab Results  Component Value Date   LDLCALC 114 (H) 02/04/2012   Lab Results  Component Value Date   TRIG 330 (H) 03/03/2020   Lab Results  Component Value Date   CHOLHDL 6 06/24/2016   No results found for: HGBA1C    Assessment & Plan:   Problem List Items Addressed This Visit      Cardiovascular and Mediastinum   Essential hypertension - Primary    - Today, the patient's blood pressure is well managed on present regime. - The patient will continue the current treatment regimen.  - I encouraged the patient to eat a low-sodium diet to  help control blood pressure. - I encouraged the patient to live an active lifestyle and complete activities that increases heart rate to 85% target heart rate at least 5 times per week for one hour.          Chronic migraine without aura without status migrainosus, not intractable    On today's visit she does not complain of any migraine.        Hematopoietic and Hemostatic   Thrombocytosis    Patient has a high platelet count and has to be followed up by the hematologist.        Other   Tremor of both hands    Patient has shaking tremors of the both hands.  Her gait is normal.  Mild spasticity of the upper limbs noted.  Face is not like a mask.  I told her to follow-up with a psychiatrist for her tremor medications.  If the tremors increases she can be referred to neurologist.      Anxiety    - Patient experiencing high levels of anxiety.  - Encouraged patient to engage in relaxing activities like yoga, meditation, journaling, going for a walk, or participating in a hobby.  - Encouraged patient to reach out to trusted friends or family members about recent struggles          No orders of the defined types were placed in this encounter.   Patient need to have follow-up with hematologist and GI specialist.  Her platelet count is high and her liver function test abnormal.  We have ordered a CBC TSH T3 and T4 and lipid panel on her.  She has been recently treated for Covid and she is off the oxygen now.  But her liver function test has cleared monitor.   Cletis Athens, MD

## 2020-04-21 NOTE — Assessment & Plan Note (Signed)
Patient has shaking tremors of the both hands.  Her gait is normal.  Mild spasticity of the upper limbs noted.  Face is left like a mask.  I told her to follow-up with a psychiatrist for her tremor medications.  If the tremors increases she can be referred to neurologist.

## 2020-04-21 NOTE — Assessment & Plan Note (Signed)
-   Patient experiencing high levels of anxiety.  - Encouraged patient to engage in relaxing activities like yoga, meditation, journaling, going for a walk, or participating in a hobby.  - Encouraged patient to reach out to trusted friends or family members about recent struggles 

## 2020-04-21 NOTE — Assessment & Plan Note (Signed)
-   Today, the patient's blood pressure is well managed on present regime. - The patient will continue the current treatment regimen.  - I encouraged the patient to eat a low-sodium diet to help control blood pressure. - I encouraged the patient to live an active lifestyle and complete activities that increases heart rate to 85% target heart rate at least 5 times per week for one hour.      

## 2020-04-21 NOTE — Assessment & Plan Note (Signed)
On today's visit she does not complain of any migraine.

## 2020-04-21 NOTE — Assessment & Plan Note (Signed)
Patient has a high platelet count and has to be followed up by the hematologist.

## 2020-04-22 DIAGNOSIS — F411 Generalized anxiety disorder: Secondary | ICD-10-CM | POA: Diagnosis not present

## 2020-04-22 DIAGNOSIS — F3175 Bipolar disorder, in partial remission, most recent episode depressed: Secondary | ICD-10-CM | POA: Diagnosis not present

## 2020-04-22 NOTE — Addendum Note (Signed)
Addended by: Alois Cliche on: 04/22/2020 09:12 AM   Modules accepted: Orders

## 2020-04-22 NOTE — Addendum Note (Signed)
Addended by: Alois Cliche on: 04/22/2020 03:10 PM   Modules accepted: Orders

## 2020-04-22 NOTE — Addendum Note (Signed)
Addended by: Alois Cliche on: 04/22/2020 03:08 PM   Modules accepted: Orders

## 2020-04-23 ENCOUNTER — Ambulatory Visit: Payer: Medicare HMO | Admitting: Internal Medicine

## 2020-04-28 ENCOUNTER — Other Ambulatory Visit: Payer: Self-pay

## 2020-04-28 ENCOUNTER — Ambulatory Visit (INDEPENDENT_AMBULATORY_CARE_PROVIDER_SITE_OTHER): Payer: Medicare HMO

## 2020-04-28 DIAGNOSIS — Z0000 Encounter for general adult medical examination without abnormal findings: Secondary | ICD-10-CM | POA: Diagnosis not present

## 2020-04-29 LAB — CBC WITH DIFFERENTIAL/PLATELET
Absolute Monocytes: 484 cells/uL (ref 200–950)
Basophils Absolute: 94 cells/uL (ref 0–200)
Basophils Relative: 1.2 %
Eosinophils Absolute: 203 cells/uL (ref 15–500)
Eosinophils Relative: 2.6 %
HCT: 39.9 % (ref 35.0–45.0)
Hemoglobin: 13.2 g/dL (ref 11.7–15.5)
Lymphs Abs: 1724 cells/uL (ref 850–3900)
MCH: 28.8 pg (ref 27.0–33.0)
MCHC: 33.1 g/dL (ref 32.0–36.0)
MCV: 86.9 fL (ref 80.0–100.0)
MPV: 11.9 fL (ref 7.5–12.5)
Monocytes Relative: 6.2 %
Neutro Abs: 5296 cells/uL (ref 1500–7800)
Neutrophils Relative %: 67.9 %
Platelets: 257 10*3/uL (ref 140–400)
RBC: 4.59 10*6/uL (ref 3.80–5.10)
RDW: 13.8 % (ref 11.0–15.0)
Total Lymphocyte: 22.1 %
WBC: 7.8 10*3/uL (ref 3.8–10.8)

## 2020-04-29 LAB — COMPLETE METABOLIC PANEL WITH GFR
AG Ratio: 1.5 (calc) (ref 1.0–2.5)
ALT: 6 U/L (ref 6–29)
AST: 14 U/L (ref 10–35)
Albumin: 4 g/dL (ref 3.6–5.1)
Alkaline phosphatase (APISO): 44 U/L (ref 37–153)
BUN: 10 mg/dL (ref 7–25)
CO2: 24 mmol/L (ref 20–32)
Calcium: 8.8 mg/dL (ref 8.6–10.4)
Chloride: 105 mmol/L (ref 98–110)
Creat: 0.87 mg/dL (ref 0.50–1.05)
GFR, Est African American: 90 mL/min/{1.73_m2} (ref >=60)
GFR, Est Non African American: 78 mL/min/{1.73_m2} (ref >=60)
Globulin: 2.6 g/dL (calc) (ref 1.9–3.7)
Glucose, Bld: 92 mg/dL (ref 65–99)
Potassium: 4.1 mmol/L (ref 3.5–5.3)
Sodium: 137 mmol/L (ref 135–146)
Total Bilirubin: 0.4 mg/dL (ref 0.2–1.2)
Total Protein: 6.6 g/dL (ref 6.1–8.1)

## 2020-04-29 LAB — TSH: TSH: 0.41 mIU/L

## 2020-04-29 LAB — HEMOGLOBIN A1C
Hgb A1c MFr Bld: 5.2 % of total Hgb (ref <5.7)
Mean Plasma Glucose: 103 (calc)
eAG (mmol/L): 5.7 (calc)

## 2020-05-01 ENCOUNTER — Encounter: Payer: Self-pay | Admitting: *Deleted

## 2020-05-12 DIAGNOSIS — U071 COVID-19: Secondary | ICD-10-CM | POA: Diagnosis not present

## 2020-06-25 DIAGNOSIS — L650 Telogen effluvium: Secondary | ICD-10-CM | POA: Diagnosis not present

## 2020-07-14 ENCOUNTER — Other Ambulatory Visit: Payer: Self-pay

## 2020-07-14 ENCOUNTER — Encounter: Payer: Self-pay | Admitting: Internal Medicine

## 2020-07-14 ENCOUNTER — Ambulatory Visit
Admission: RE | Admit: 2020-07-14 | Discharge: 2020-07-14 | Disposition: A | Discharge status: Home / Self Care | Payer: Medicare HMO | Attending: Internal Medicine | Admitting: Internal Medicine

## 2020-07-14 ENCOUNTER — Ambulatory Visit (INDEPENDENT_AMBULATORY_CARE_PROVIDER_SITE_OTHER): Payer: Medicare HMO | Admitting: Internal Medicine

## 2020-07-14 ENCOUNTER — Ambulatory Visit
Admission: RE | Admit: 2020-07-14 | Discharge: 2020-07-14 | Disposition: A | Discharge status: Home / Self Care | Payer: Medicare HMO | Source: Ambulatory Visit | Attending: Internal Medicine | Admitting: Internal Medicine

## 2020-07-14 VITALS — BP 132/83 | HR 85 | Ht 69.0 in | Wt 156.9 lb

## 2020-07-14 DIAGNOSIS — I10 Essential (primary) hypertension: Secondary | ICD-10-CM | POA: Diagnosis not present

## 2020-07-14 DIAGNOSIS — K219 Gastro-esophageal reflux disease without esophagitis: Secondary | ICD-10-CM

## 2020-07-14 DIAGNOSIS — E785 Hyperlipidemia, unspecified: Secondary | ICD-10-CM | POA: Diagnosis not present

## 2020-07-14 DIAGNOSIS — F419 Anxiety disorder, unspecified: Secondary | ICD-10-CM

## 2020-07-14 DIAGNOSIS — R251 Tremor, unspecified: Secondary | ICD-10-CM

## 2020-07-14 DIAGNOSIS — L659 Nonscarring hair loss, unspecified: Secondary | ICD-10-CM

## 2020-07-14 DIAGNOSIS — M546 Pain in thoracic spine: Secondary | ICD-10-CM

## 2020-07-14 DIAGNOSIS — M797 Fibromyalgia: Secondary | ICD-10-CM

## 2020-07-14 DIAGNOSIS — M549 Dorsalgia, unspecified: Secondary | ICD-10-CM | POA: Diagnosis not present

## 2020-07-14 NOTE — Assessment & Plan Note (Signed)
-   The patient's GERD is stable on medication.  - Instructed the patient to avoid eating spicy and acidic foods, as well as foods high in fat. - Instructed the patient to avoid eating large meals or meals 2-3 hours prior to sleeping. 

## 2020-07-14 NOTE — Progress Notes (Signed)
Established Patient Office Visit  Subjective:  Patient ID: Melissa [name], female    DOB: 20-Aug-1970  Age: 50 y.o. MRN: [id number]  CC:  Chief Complaint  Patient presents with  . Back Pain    Patient having back pain (nerve pain) at bra strap causing numbness in hand and legs     HPI  Melissa [name] presents fornumbness of rt hand and pain in back  And numbness of rt leg and foot, patient sees her neurologist for the headache, she also has a bipolar disorder chronic muscle spasm and panic disorder  Past Medical History:  Diagnosis Date  . Arthritis   . Bipolar disorder ([place])   . Fibromyalgia    on disability  . Migraine   . Muscle spasm   . Pain of right side of body    chronic  . Panic attack   . POTS (postural orthostatic tachycardia syndrome)   . Syncope    recurrent  . Uterine perforation    hx of    Past Surgical History:  Procedure Laterality Date  . CATARACT EXTRACTION  dec 2014  . DILATION AND CURETTAGE OF UTERUS    . LASIK  dec 2014    Family History  Problem Relation Age of Onset  . Diabetes Mother   . Hypertension Mother   . Brain cancer Father   . Ovarian cancer Maternal Grandmother        metastasized  . Lung cancer Maternal Grandfather   . Cancer Paternal Grandmother        not sure of the type  . Cancer Paternal Grandfather        not sure of the type  . Migraines Neg Hx     Social History   Socioeconomic History  . Marital status: Married    Spouse name: Izell Sehili  . Number of children: 0  . Years of education: some college  . Highest education level: Not on file  Occupational History    Employer: OTHER    Comment: disabled  Tobacco Use  . Smoking status: Never Smoker  . Smokeless tobacco: Never Used  Vaping Use  . Vaping Use: Never used  Substance and Sexual Activity  . Alcohol use: No    Alcohol/week: 0.0 standard drinks  . Drug use: No  . Sexual activity: Yes    Birth control/protection: Surgical    Comment: also on OCPs   Other Topics Concern  . Not on file  Social History Narrative   08/28/19   From: [place], moved because her husband was from [facility]   Living: with husband Izell , since 1990   Work: disability due to fibromyalgia and bipolar disorder      Family: mother in law is nearby, her mother is in [place]; has a sister      Enjoys: crochet, baking, reading      Exercise: walking the dog, yoga - tries to do 3 times a week   Diet: not great, more sweets than she should      Safety   Seat belts: Yes    Guns: Yes  and secure   Safe in relationships: Yes          Right handed   Caffeine: none    Social Determinants of Health   Financial Resource Strain: Not on file  Food Insecurity: Not on file  Transportation Needs: Not on file  Physical Activity: Not on file  Stress: Not on file  Social Connections: Not on file  Intimate Partner Violence: Not on file     Current Outpatient Medications:  .  acetaminophen (TYLENOL) 500 MG tablet, Take 1,000 mg by mouth daily as needed for mild pain., Disp: , Rfl:  .  buPROPion (WELLBUTRIN XL) 150 MG 24 hr tablet, Take 1 tablet (150 mg total) by mouth daily., Disp: 90 tablet, Rfl: 3 .  cholecalciferol (VITAMIN D) 1000 units tablet, Take 5,000 Units by mouth daily., Disp: , Rfl:  .  clonazePAM (KLONOPIN) 1 MG tablet, Take 1 tablet (1 mg total) by mouth 2 (two) times daily as needed. for anxiety, Disp: 60 tablet, Rfl: 0 .  cyclobenzaprine (FLEXERIL) 10 MG tablet, Take 10 mg by mouth 3 (three) times daily as needed for muscle spasms. , Disp: , Rfl:  .  fluvoxaMINE (LUVOX) 50 MG tablet, Take 50-100 mg by mouth at bedtime. , Disp: , Rfl:  .  gabapentin (NEURONTIN) 600 MG tablet, Take 1 tablet (600 mg total) by mouth 3 (three) times daily., Disp: 270 tablet, Rfl: 3 .  Galcanezumab-gnlm (EMGALITY) 120 MG/ML SOAJ, Inject 120 mg into the skin every 30 (thirty) days., Disp: 1 mL, Rfl: 11 .  norgestimate-ethinyl estradiol (SPRINTEC 28) 0.25-35 MG-MCG tablet, Take 1  tablet by mouth daily., Disp: 6 Package, Rfl: 1 .  rizatriptan (MAXALT-MLT) 10 MG disintegrating tablet, Take 1 tablet (10 mg total) by mouth as needed for migraine. May repeat in 2 hours if needed, Disp: 9 tablet, Rfl: 11 .  verapamil (CALAN-SR) 120 MG CR tablet, Hold this until followup with outpatient doctor because your blood pressure has been normal without it in the hospital., Disp: 90 tablet, Rfl: 3 .  vitamin B-12 (CYANOCOBALAMIN) 1000 MCG tablet, Take 1,000 mcg by mouth daily., Disp: , Rfl:  .  ipratropium (ATROVENT HFA) 17 MCG/ACT inhaler, Inhale 2 puffs into the lungs 4 (four) times daily for 10 days., Disp: 1 each, Rfl: 12   Allergies  Allergen Reactions  . Lamotrigine Hives and Swelling  . Abilify [Aripiprazole] Other (See Comments)    Weight gain, did not help,   . Azithromycin Rash  . Depakote [Divalproex Sodium] Other (See Comments)    Hair loss  . Erythromycin Base Swelling  . Lithium Rash  . Penicillins Rash    Has patient had a PCN reaction causing immediate rash, facial/tongue/throat swelling, SOB or lightheadedness with hypotension: Yes- swelling, rash  Has patient had a PCN reaction causing severe rash involving mucus membranes or skin necrosis: Yes Has patient had a PCN reaction that required hospitalization No Has patient had a PCN reaction occurring within the last 10 years: No If all of the above answers are "NO", then may proceed with Cephalosporin use.   . Pregabalin Palpitations    Manic symptoms, no sleep x's 3 days, no appetite.  . Seroquel [Quetiapine Fumarate] Other (See Comments)    Irritable  . Sulfa Antibiotics Rash  . Tetanus Toxoids Other (See Comments)    Swelling to injection site  . Tramadol Nausea And Vomiting  . Ziprasidone Hcl Other (See Comments)    Heat/cold intolerance.     ROS Review of Systems  Constitutional: Negative.   HENT: Negative.   Eyes: Negative.   Respiratory: Negative.   Cardiovascular: Negative.   Gastrointestinal:  Negative.   Endocrine: Negative.   Genitourinary: Negative.  Negative for dysuria and hematuria.  Musculoskeletal: Negative.   Skin: Negative.   Allergic/Immunologic: Negative.   Neurological: Positive for numbness and headaches. Negative for speech difficulty.  Hematological: Negative.  Psychiatric/Behavioral: Negative.  Negative for agitation, confusion, dysphoric mood and hallucinations.  All other systems reviewed and are negative.     Objective:    Physical Exam Vitals reviewed.  Constitutional:      Appearance: Normal appearance.  HENT:     Mouth/Throat:     Mouth: Mucous membranes are moist.  Eyes:     Pupils: Pupils are equal, round, and reactive to light.  Neck:     Vascular: No carotid bruit.  Cardiovascular:     Rate and Rhythm: Normal rate and regular rhythm.     Pulses: Normal pulses.     Heart sounds: Normal heart sounds.  Pulmonary:     Effort: Pulmonary effort is normal.     Breath sounds: Normal breath sounds.  Abdominal:     General: Bowel sounds are normal.     Palpations: Abdomen is soft. There is no hepatomegaly, splenomegaly or mass.     Tenderness: There is no abdominal tenderness.     Hernia: No hernia is present.  Musculoskeletal:        General: No tenderness.     Cervical back: Neck supple.     Right lower leg: No edema.     Left lower leg: No edema.  Skin:    Findings: No rash.  Neurological:     Mental Status: She is alert and oriented to person, place, and time.     Motor: No weakness.  Psychiatric:        Mood and Affect: Mood and affect normal.        Behavior: Behavior normal.     BP 132/83   Pulse 85   Ht 5\' 9"  (1.753 m)   Wt 156 lb 14.4 oz (71.2 kg)   BMI 23.17 kg/m  Wt Readings from Last 3 Encounters:  07/14/20 156 lb 14.4 oz (71.2 kg)  04/21/20 153 lb 9.6 oz (69.7 kg)  03/25/20 152 lb (68.9 kg)     Health Maintenance Due  Topic Date Due  . Hepatitis C Screening  Never done  . COLONOSCOPY (Pts 45-26yrs  Insurance coverage will need to be confirmed)  Never done  . INFLUENZA VACCINE  Never done    There are no preventive care reminders to display for this patient.  Lab Results  Component Value Date   TSH 0.41 04/28/2020   Lab Results  Component Value Date   WBC 7.8 04/28/2020   HGB 13.2 04/28/2020   HCT 39.9 04/28/2020   MCV 86.9 04/28/2020   PLT 257 04/28/2020   Lab Results  Component Value Date   NA 137 04/28/2020   K 4.1 04/28/2020   CO2 24 04/28/2020   GLUCOSE 92 04/28/2020   BUN 10 04/28/2020   CREATININE 0.87 04/28/2020   BILITOT 0.4 04/28/2020   ALKPHOS 68 03/07/2020   AST 14 04/28/2020   ALT 6 04/28/2020   PROT 6.6 04/28/2020   ALBUMIN 2.7 (L) 03/07/2020   CALCIUM 8.8 04/28/2020   ANIONGAP 11 03/12/2020   GFR 68.49 05/09/2017   Lab Results  Component Value Date   CHOL 245 (H) 06/24/2016   Lab Results  Component Value Date   HDL 40.20 06/24/2016   Lab Results  Component Value Date   LDLCALC 114 (H) 02/04/2012   Lab Results  Component Value Date   TRIG 330 (H) 03/03/2020   Lab Results  Component Value Date   CHOLHDL 6 06/24/2016   Lab Results  Component Value Date   HGBA1C 5.2 04/28/2020  Assessment & Plan:   Problem List Items Addressed This Visit      Cardiovascular and Mediastinum   Essential hypertension    - Today, the patient's blood pressure is well managed on present regime. - The patient will continue the current treatment regimen.  - I encouraged the patient to eat a low-sodium diet to help control blood pressure. - I encouraged the patient to live an active lifestyle and complete activities that increases heart rate to 85% target heart rate at least 5 times per week for one hour.            Digestive   Gastroesophageal reflux disease without esophagitis    - The patient's GERD is stable on medication.  - Instructed the patient to avoid eating spicy and acidic foods, as well as foods high in fat. - Instructed the  patient to avoid eating large meals or meals 2-3 hours prior to sleeping.         Musculoskeletal and Integument   Alopecia    Pt was given  A wig  prescription        Other   Fibromyalgia    ch problem      Dyslipidemia     - I encouraged the patient to eat a low-sodium diet to help control blood pressure. - I encouraged the patient to live an active lifestyle and complete activities that increases heart rate to 85% target heart rate at least 5 times per week for one hour.          Tremor of both hands - Primary    Ref to neuro       Anxiety    - Patient experiencing high levels of anxiety.  - Encouraged patient to engage in relaxing activities like yoga, meditation, journaling, going for a walk, or participating in a hobby.  - Encouraged patient to reach out to trusted friends or family members about recent struggles        Other Visit Diagnoses    Acute bilateral thoracic back pain       Relevant Orders   DG Thoracic Spine W/Swimmers      No orders of the defined types were placed in this encounter.   Follow-up: No follow-ups on file. Patient will be referred to neurologist, she was advised to continue taking her present medication.  Try to lose weight and follow low triglyceride diet.  She was given a prescription for a Wig  Cletis Athens, MD

## 2020-07-14 NOTE — Assessment & Plan Note (Signed)
.  -   I encouraged the patient to eat a low-sodium diet to help control blood pressure. - I encouraged the patient to live an active lifestyle and complete activities that increases heart rate to 85% target heart rate at least 5 times per week for one hour.     

## 2020-07-14 NOTE — Assessment & Plan Note (Signed)
-   Today, the patient's blood pressure is well managed on present regime. - The patient will continue the current treatment regimen.  - I encouraged the patient to eat a low-sodium diet to help control blood pressure. - I encouraged the patient to live an active lifestyle and complete activities that increases heart rate to 85% target heart rate at least 5 times per week for one hour.      

## 2020-07-14 NOTE — Assessment & Plan Note (Signed)
Pt was given  A wig  prescription

## 2020-07-14 NOTE — Assessment & Plan Note (Signed)
ch problem 

## 2020-07-14 NOTE — Assessment & Plan Note (Signed)
-   Patient experiencing high levels of anxiety.  - Encouraged patient to engage in relaxing activities like yoga, meditation, journaling, going for a walk, or participating in a hobby.  - Encouraged patient to reach out to trusted friends or family members about recent struggles 

## 2020-07-14 NOTE — Assessment & Plan Note (Signed)
Ref to neuro 

## 2020-07-21 ENCOUNTER — Other Ambulatory Visit: Payer: Self-pay

## 2020-07-21 DIAGNOSIS — R200 Anesthesia of skin: Secondary | ICD-10-CM

## 2020-07-22 ENCOUNTER — Other Ambulatory Visit: Payer: Self-pay | Admitting: Internal Medicine

## 2020-07-28 DIAGNOSIS — F3162 Bipolar disorder, current episode mixed, moderate: Secondary | ICD-10-CM | POA: Diagnosis not present

## 2020-07-28 DIAGNOSIS — F411 Generalized anxiety disorder: Secondary | ICD-10-CM | POA: Diagnosis not present

## 2020-07-29 ENCOUNTER — Emergency Department: Payer: Medicare HMO

## 2020-07-29 ENCOUNTER — Encounter: Payer: Self-pay | Admitting: Emergency Medicine

## 2020-07-29 ENCOUNTER — Emergency Department
Admission: EM | Admit: 2020-07-29 | Discharge: 2020-07-29 | Disposition: A | Discharge status: Home / Self Care | Payer: Medicare HMO | Attending: Emergency Medicine | Admitting: Emergency Medicine

## 2020-07-29 ENCOUNTER — Other Ambulatory Visit: Payer: Self-pay

## 2020-07-29 DIAGNOSIS — R531 Weakness: Secondary | ICD-10-CM | POA: Insufficient documentation

## 2020-07-29 DIAGNOSIS — Z8616 Personal history of COVID-19: Secondary | ICD-10-CM | POA: Insufficient documentation

## 2020-07-29 DIAGNOSIS — Z79899 Other long term (current) drug therapy: Secondary | ICD-10-CM | POA: Insufficient documentation

## 2020-07-29 DIAGNOSIS — M79601 Pain in right arm: Secondary | ICD-10-CM | POA: Diagnosis not present

## 2020-07-29 DIAGNOSIS — R29818 Other symptoms and signs involving the nervous system: Secondary | ICD-10-CM | POA: Diagnosis not present

## 2020-07-29 DIAGNOSIS — R202 Paresthesia of skin: Secondary | ICD-10-CM | POA: Diagnosis not present

## 2020-07-29 DIAGNOSIS — R42 Dizziness and giddiness: Secondary | ICD-10-CM | POA: Diagnosis not present

## 2020-07-29 DIAGNOSIS — M5412 Radiculopathy, cervical region: Secondary | ICD-10-CM | POA: Diagnosis not present

## 2020-07-29 DIAGNOSIS — J189 Pneumonia, unspecified organism: Secondary | ICD-10-CM | POA: Diagnosis not present

## 2020-07-29 DIAGNOSIS — M549 Dorsalgia, unspecified: Secondary | ICD-10-CM | POA: Diagnosis not present

## 2020-07-29 DIAGNOSIS — R200 Anesthesia of skin: Secondary | ICD-10-CM | POA: Insufficient documentation

## 2020-07-29 DIAGNOSIS — M50121 Cervical disc disorder at C4-C5 level with radiculopathy: Secondary | ICD-10-CM | POA: Diagnosis not present

## 2020-07-29 DIAGNOSIS — M50123 Cervical disc disorder at C6-C7 level with radiculopathy: Secondary | ICD-10-CM | POA: Diagnosis not present

## 2020-07-29 DIAGNOSIS — I10 Essential (primary) hypertension: Secondary | ICD-10-CM | POA: Diagnosis not present

## 2020-07-29 LAB — BASIC METABOLIC PANEL
Anion gap: 6 (ref 5–15)
BUN: 10 mg/dL (ref 6–20)
CO2: 27 mmol/L (ref 22–32)
Calcium: 8.6 mg/dL — ABNORMAL LOW (ref 8.9–10.3)
Chloride: 105 mmol/L (ref 98–111)
Creatinine, Ser: 1.05 mg/dL — ABNORMAL HIGH (ref 0.44–1.00)
GFR, Estimated: >60 mL/min (ref >60)
Glucose, Bld: 91 mg/dL (ref 70–99)
Potassium: 4.3 mmol/L (ref 3.5–5.1)
Sodium: 138 mmol/L (ref 135–145)

## 2020-07-29 LAB — CBC
HCT: 41.6 % (ref 36.0–46.0)
Hemoglobin: 13.9 g/dL (ref 12.0–15.0)
MCH: 28.3 pg (ref 26.0–34.0)
MCHC: 33.4 g/dL (ref 30.0–36.0)
MCV: 84.6 fL (ref 80.0–100.0)
Platelets: 285 10*3/uL (ref 150–400)
RBC: 4.92 MIL/uL (ref 3.87–5.11)
RDW: 12.8 % (ref 11.5–15.5)
WBC: 7.9 10*3/uL (ref 4.0–10.5)
nRBC: 0 % (ref 0.0–0.2)

## 2020-07-29 LAB — TROPONIN I (HIGH SENSITIVITY)
Troponin I (High Sensitivity): <2 ng/L (ref <18)
Troponin I (High Sensitivity): <2 ng/L (ref <18)

## 2020-07-29 MED ORDER — SODIUM CHLORIDE 0.9 % IV BOLUS
500.0000 mL | Freq: Once | INTRAVENOUS | Status: AC
  Administered 2020-07-29: 500 mL via INTRAVENOUS

## 2020-07-29 MED ORDER — LIDOCAINE 5 % EX PTCH: 1.0000 | MEDICATED_PATCH | Freq: Two times a day (BID) | CUTANEOUS | 0 refills | Status: DC

## 2020-07-29 MED ORDER — KETOROLAC TROMETHAMINE 30 MG/ML IJ SOLN
10.0000 mg | Freq: Once | INTRAMUSCULAR | Status: AC
  Administered 2020-07-29: 9.9 mg via INTRAVENOUS
  Filled 2020-07-29: qty 1

## 2020-07-29 MED ORDER — ONDANSETRON HCL 4 MG/2ML IJ SOLN
4.0000 mg | Freq: Once | INTRAMUSCULAR | Status: AC
  Administered 2020-07-29: 4 mg via INTRAVENOUS
  Filled 2020-07-29: qty 2

## 2020-07-29 MED ORDER — OXYCODONE-ACETAMINOPHEN 5-325 MG PO TABS: 1.0000 | ORAL_TABLET | ORAL | 0 refills | Status: DC | PRN

## 2020-07-29 MED ORDER — MORPHINE SULFATE (PF) 2 MG/ML IV SOLN
2.0000 mg | Freq: Once | INTRAVENOUS | Status: AC
  Administered 2020-07-29: 2 mg via INTRAVENOUS
  Filled 2020-07-29: qty 1

## 2020-07-29 NOTE — ED Notes (Signed)
Patient transported to MRI 

## 2020-07-29 NOTE — ED Provider Notes (Signed)
[facility] Emergency Department Provider Note   ____________________________________________   Event Date/Time   First MD Initiated Contact with Patient 07/29/20 [phone]     (approximate)  I have reviewed the triage vital signs and the nursing notes.   HISTORY  Chief Complaint Back Pain and Numbness    HPI Melissa [name] is a 50 y.o. female who presents to the ED from home with a chief complaint of right arm numbness/tingling/pain x3.5 weeks and nontraumatic dull mid back pain for several weeks. Has seen her PCP who obtain thoracic x-rays and has referred her to neurology which she has an appointment on February 22. She is here due to escalation of right arm pain which makes her uncomfortable and unable to sleep. Has tried OTCs, muscle relaxers without improvement in symptoms. Has also noted some right leg numbness without weakness.  Denies bowel or bladder incontinence.  Denies slurred speech, altered mentation, facial droop. Over the past several weeks she has noted right hand grip weakness where she is dropping things. Patient had COVID-19 pneumonia in September. Denies recent fever, chest pain, cough, shortness of breath, abdominal pain, nausea, vomiting or dizziness.     Past Medical History:  Diagnosis Date  . Arthritis   . Bipolar disorder ([place])   . Fibromyalgia    on disability  . Migraine   . Muscle spasm   . Pain of right side of body    chronic  . Panic attack   . POTS (postural orthostatic tachycardia syndrome)   . Syncope    recurrent  . Uterine perforation    hx of    Patient Active Problem List   Diagnosis Date Noted  . Thrombocytosis 03/09/2020  . Hyperglycemia 03/09/2020  . Pneumonia due to COVID-19 virus 03/03/2020  . Depression with anxiety 03/03/2020  . Sepsis ([place]) 03/03/2020  . Acute respiratory failure with hypoxia ([place])   . Chronic migraine without aura without status migrainosus, not intractable 02/11/2020  . Anxiety  10/23/2019  . Dizziness 07/02/2019  . Tremor of both hands 03/27/2018  . Syncopal episodes 09/10/2017  . Essential hypertension 05/09/2017  . Menorrhagia 11/11/2016  . Alopecia 03/02/2016  . Bilateral cataracts 08/14/2015  . Acne 10/21/2014  . Liver lesion 09/09/2014  . Vitamin D deficiency 09/09/2014  . Hot flashes 06/10/2014  . Chronic pain syndrome 05/20/2014  . Vitamin B12 deficiency 03/12/2014  . Contraception management 09/20/2013  . IBS (irritable bowel syndrome) 09/20/2013  . Unspecified vitamin D deficiency 04/03/2013  . Dyslipidemia 04/03/2013  . Osteopenia 04/03/2013  . POTS (postural orthostatic tachycardia syndrome) 02/26/2013  . Migraine 11/29/2012  . Allergic rhinitis 10/05/2012  . Mixed bipolar I disorder in remission ([place]) 10/05/2012  . Combined fat and carbohydrate induced hyperlipemia 10/05/2012  . Fibromyalgia   . Bipolar disorder ([place])   . Fatigue 09/10/2011  . Clinical depression 09/10/2011  . Gastroesophageal reflux disease without esophagitis 09/10/2011  . Non-restorative sleep 09/10/2011  . Disturbance in sleep behavior 09/10/2011    Past Surgical History:  Procedure Laterality Date  . CATARACT EXTRACTION  dec 2014  . DILATION AND CURETTAGE OF UTERUS    . LASIK  dec 2014    Prior to Admission medications   Medication Sig Start Date End Date Taking? Authorizing Provider  acetaminophen (TYLENOL) 500 MG tablet Take 1,000 mg by mouth daily as needed for mild pain.    [provider]  buPROPion (WELLBUTRIN XL) 150 MG 24 hr tablet Take 1 tablet (150 mg total)  by mouth daily. 07/17/19   Lucille Passy, MD  cholecalciferol (VITAMIN D) 1000 units tablet Take 5,000 Units by mouth daily.    [provider]  clonazePAM (KLONOPIN) 1 MG tablet Take 1 tablet (1 mg total) by mouth 2 (two) times daily as needed. for anxiety 05/26/16   Lucille Passy, MD  cyclobenzaprine (FLEXERIL) 10 MG tablet Take 10 mg by mouth 3 (three) times daily as needed for  muscle spasms.  11/17/19   [provider]  fluvoxaMINE (LUVOX) 50 MG tablet Take 50-100 mg by mouth at bedtime.  05/23/17   [provider]  gabapentin (NEURONTIN) 600 MG tablet TAKE 1 TABLET BY MOUTH 3 TIMES DAILY 07/22/20   Masoud, Viann Shove, MD  Galcanezumab-gnlm [facility]) 120 MG/ML SOAJ Inject 120 mg into the skin every 30 (thirty) days. 02/11/20   Melvenia Beam, MD  ipratropium (ATROVENT HFA) 17 MCG/ACT inhaler Inhale 2 puffs into the lungs 4 (four) times daily for 10 days. 03/12/20 03/22/20  Enzo Bi, MD  norgestimate-ethinyl estradiol ([place] 28) 0.25-35 MG-MCG tablet Take 1 tablet by mouth daily. 07/17/19   Lucille Passy, MD  rizatriptan (MAXALT-MLT) 10 MG disintegrating tablet Take 1 tablet (10 mg total) by mouth as needed for migraine. May repeat in 2 hours if needed 02/11/20   Melvenia Beam, MD  verapamil (CALAN-SR) 120 MG CR tablet Hold this until followup with outpatient doctor because your blood pressure has been normal without it in the hospital. 03/12/20   Enzo Bi, MD  vitamin B-12 (CYANOCOBALAMIN) 1000 MCG tablet Take 1,000 mcg by mouth daily.    [provider]    Allergies Lamotrigine, Abilify [aripiprazole], Azithromycin, Depakote [divalproex sodium], Erythromycin base, Lithium, Penicillins, Pregabalin, Seroquel [quetiapine fumarate], Sulfa antibiotics, Tetanus toxoids, Tramadol, and Ziprasidone hcl  Family History  Problem Relation Age of Onset  . Diabetes Mother   . Hypertension Mother   . Brain cancer Father   . Ovarian cancer Maternal Grandmother        metastasized  . Lung cancer Maternal Grandfather   . Cancer Paternal Grandmother        not sure of the type  . Cancer Paternal Grandfather        not sure of the type  . Migraines Neg Hx     Social History Social History   Tobacco Use  . Smoking status: Never Smoker  . Smokeless tobacco: Never Used  Vaping Use  . Vaping Use: Never used  Substance Use Topics  . Alcohol use: No     Alcohol/week: 0.0 standard drinks  . Drug use: No    Review of Systems  Constitutional: No fever/chills Eyes: No visual changes. ENT: No sore throat. Cardiovascular: Denies chest pain. Respiratory: Denies shortness of breath. Gastrointestinal: No abdominal pain.  No nausea, no vomiting.  No diarrhea.  No constipation. Genitourinary: Negative for dysuria. Musculoskeletal: Positive for back pain. Skin: Negative for rash. Neurological: Negative for headaches, focal weakness. Positive for right arm numbness.   ____________________________________________   PHYSICAL EXAM:  VITAL SIGNS: ED Triage Vitals [07/29/20 0412]  Enc Vitals Group     BP 122/63     Pulse Rate 100     Resp 18     Temp 98.3 F (36.8 C)     Temp Source Oral     SpO2 99 %     Weight      Height      Head Circumference      Peak Flow  Pain Score      Pain Loc      Pain Edu?      Excl. in [place]?     Constitutional: Alert and oriented. Well appearing and in no acute distress. Eyes: Conjunctivae are normal. PERRL. EOMI. Head: Atraumatic. Nose: No congestion/rhinnorhea. Mouth/Throat: Mucous membranes are moist.   Neck: No stridor.  No cervical spine tenderness to palpation. No carotid bruits. Cardiovascular: Normal rate, regular rhythm. Grossly normal heart sounds.  Good peripheral circulation. Respiratory: Normal respiratory effort.  No retractions. Lungs CTAB. Gastrointestinal: Soft and nontender. No distention. No abdominal bruits. No CVA tenderness. Musculoskeletal: No RUE swelling. 2+ radial pulses. Brisk, less than 5-second capillary refill. Full range of motion RUE without pain. No lower extremity tenderness nor edema.  No joint effusions. Neurologic: Alert and oriented x3. CN II-XII sling intact. Normal speech and language. Very slight right hand grip weakness compared to the left. Diminished RUE sensation compared to the left.  BLE 5/5 motor strength and sensation.  No gait instability. Skin:   Skin is warm, dry and intact. No rash noted. Psychiatric: Mood and affect are normal. Speech and behavior are normal.  ____________________________________________   LABS (all labs ordered are listed, but only abnormal results are displayed)  Labs Reviewed  BASIC METABOLIC PANEL - Abnormal; Notable for the following components:      Result Value   Creatinine, Ser 1.05 (*)    Calcium 8.6 (*)    All other components within normal limits  CBC  POC URINE PREG, ED  TROPONIN I (HIGH SENSITIVITY)  TROPONIN I (HIGH SENSITIVITY)   ____________________________________________  EKG  ED ECG REPORT I, Gerik Coberly J, the attending physician, personally viewed and interpreted this ECG.   Date: 07/29/2020  EKG Time: 0458  Rate: 93  Rhythm: normal EKG, normal sinus rhythm  Axis: Normal  Intervals:none  ST&T Change: Nonspecific  ____________________________________________  RADIOLOGY I, Beulah Matusek J, personally viewed and evaluated these images (plain radiographs) as part of my medical decision making, as well as reviewing the written report by the radiologist.  ED MD interpretation: No acute cardiopulmonary process, no ICH  Official radiology report(s): DG Chest 2 View  Result Date: 07/29/2020 CLINICAL DATA:  50 year old female with chest and mid back pain for several weeks. Tingling. Right upper extremity pain. EXAM: CHEST - 2 VIEW COMPARISON:  Chest CTA 03/03/2020 and earlier. FINDINGS: Lung volumes and mediastinal contours are normal. Visualized tracheal air column is within normal limits. No pneumothorax, pulmonary edema, pleural effusion or confluent pulmonary opacity. Resolved coarse bilateral pulmonary interstitial opacity seen in September and related to COVID-19 positivity at that time. Lung markings now appear at baseline. No acute pulmonary opacity. Negative visible bowel gas and osseous structures. IMPRESSION: Resolved COVID-19 pneumonia since September. No acute cardiopulmonary  abnormality. Electronically Signed   By: Genevie Ann M.D.   On: 07/29/2020 05:02   CT Head Wo Contrast  Result Date: 07/29/2020 CLINICAL DATA:  50 year old female with dizziness. Mid back pain. Right upper extremity numbness and tingling for several weeks. EXAM: CT HEAD WITHOUT CONTRAST TECHNIQUE: Contiguous axial images were obtained from the base of the skull through the vertex without intravenous contrast. COMPARISON:  Brain MRI 04/21/2014.  Head CT 05/09/2017. FINDINGS: Brain: Cerebral volume is stable since 2015. No midline shift, ventriculomegaly, mass effect, evidence of mass lesion, intracranial hemorrhage or evidence of cortically based acute infarction. Gray-white matter differentiation is within normal limits throughout the brain. Vascular: No hyperdense vessel or unexpected calcification. Skull: Stable, negative skull.  Partially visible chronic C1-C2 cervical spine degeneration. Sinuses/Orbits: Visualized paranasal sinuses and mastoids are clear. Tympanic cavities are clear. Other: No acute orbit or scalp soft tissue findings. IMPRESSION: 1. Stable and normal noncontrast CT appearance of the brain. 2. Chronic C1-odontoid degeneration. Electronically Signed   By: Genevie Ann M.D.   On: 07/29/2020 05:26    ____________________________________________   PROCEDURES  Procedure(s) performed (including Critical Care):  Procedures   ____________________________________________   INITIAL IMPRESSION / ASSESSMENT AND PLAN / ED COURSE  As part of my medical decision making, I reviewed the following data within the [place] History obtained from family, Nursing notes reviewed and incorporated, Labs reviewed, EKG interpreted, Old chart reviewed, Patient signed out to Dr. Charna Archer, Radiograph reviewed and Notes from prior ED visits     50 year old female presenting with a several week history of mid back pain, right arm numbness/pain, now with some weakness.  Differential diagnosis  includes but is not limited to CVA, TIA, cervical radiculopathy, demyelinating disease, ACS, PE, dissection, etc.  In the course of our conversation, patient tells me that her D-dimer is always elevated.  We did discuss risk/benefits to CTA chest to evaluate for PE.  However, given patient is not having complaints of chest pain, shortness of breath and is not tachypneic nor hypoxic, will hold off for now.  Will administer IV NSAID, analgesia for right arm pain. Will obtain MRI brain and cervical spine to further evaluate patient's paresthesias.  Clinical Course as of 07/29/20 0703  Tue Jul 29, 2020  0703 Care transferred to Dr. Charna Archer at change of shift pending MRI results and disposition. [JS]    Clinical Course User Index [JS] Paulette Blanch, MD     ____________________________________________   FINAL CLINICAL IMPRESSION(S) / ED DIAGNOSES  Final diagnoses:  Numbness  Right arm pain     ED Discharge Orders    None      *Please note:  Melissa [name] was evaluated in Emergency Department on 07/29/2020 for the symptoms described in the history of present illness. She was evaluated in the context of the global COVID-19 pandemic, which necessitated consideration that the patient might be at risk for infection with the SARS-CoV-2 virus that causes COVID-19. Institutional protocols and algorithms that pertain to the evaluation of patients at risk for COVID-19 are in a state of rapid change based on information released by regulatory bodies including the CDC and federal and state organizations. These policies and algorithms were followed during the patient's care in the ED.  Some ED evaluations and interventions may be delayed as a result of limited staffing during and the pandemic.*   Note:  This document was prepared using Dragon voice recognition software and may include unintentional dictation errors.   Paulette Blanch, MD 07/29/20 [phone]

## 2020-07-29 NOTE — ED Triage Notes (Signed)
Pt in w/complaints of deep, dull midback pain x few weeks, and R arm numbness and tingling x 3.5 wks. States her PCP has done an xray, which was unremarkable. Unable to see a neurologist until next week. No weakness or deficits on neuro assessment, but reports her R arm, elbow down has 6/10 constant pain.

## 2020-07-29 NOTE — ED Notes (Signed)
AAOx3.  Skin warm and dry. NAD.  Ambulates with easy and steady gait.

## 2020-07-29 NOTE — ED Provider Notes (Signed)
-----------------------------------------   7:05 AM on 07/29/2020 -----------------------------------------  Blood pressure 122/63, pulse 100, temperature 98.3 F (36.8 C), temperature source Oral, resp. rate 18, SpO2 99 %.  Assuming care from Dr. Beather Arbour.  In short, Melissa [name] is a 50 y.o. female with a chief complaint of Back Pain and Numbness .  Refer to the original H&P for additional details.  The current plan of care is to follow-up MRI brain and cervical spine results to further assess right arm numbness, paresthesia, and mild weakness.  ----------------------------------------- 8:27 AM on 07/29/2020 -----------------------------------------  MRI brain is negative for acute process, no evidence of acute stroke.  MRI of her cervical spine shows right-sided foraminal stenosis consistent with her right-sided cervical radicular symptoms.  There is no significant central canal stenosis and patient is appropriate for discharge home with neurosurgery follow-up.  She will be prescribed short course of pain medication as well as Lidoderm patches.  She was counseled to follow-up with neurosurgery and otherwise return to the ED for new worsening symptoms, patient agrees with plan.    Blake Divine, MD 07/29/20 [phone]

## 2020-08-03 ENCOUNTER — Other Ambulatory Visit: Payer: Self-pay

## 2020-08-03 ENCOUNTER — Emergency Department: Payer: Medicare HMO

## 2020-08-03 ENCOUNTER — Emergency Department
Admission: EM | Admit: 2020-08-03 | Discharge: 2020-08-03 | Disposition: A | Discharge status: Home / Self Care | Payer: Medicare HMO | Attending: Emergency Medicine | Admitting: Emergency Medicine

## 2020-08-03 ENCOUNTER — Encounter: Payer: Self-pay | Admitting: Emergency Medicine

## 2020-08-03 DIAGNOSIS — M79604 Pain in right leg: Secondary | ICD-10-CM

## 2020-08-03 DIAGNOSIS — W228XXA Striking against or struck by other objects, initial encounter: Secondary | ICD-10-CM | POA: Diagnosis not present

## 2020-08-03 DIAGNOSIS — Z8616 Personal history of COVID-19: Secondary | ICD-10-CM | POA: Diagnosis not present

## 2020-08-03 DIAGNOSIS — S0990XA Unspecified injury of head, initial encounter: Secondary | ICD-10-CM | POA: Diagnosis not present

## 2020-08-03 DIAGNOSIS — R200 Anesthesia of skin: Secondary | ICD-10-CM | POA: Diagnosis not present

## 2020-08-03 DIAGNOSIS — M545 Low back pain, unspecified: Secondary | ICD-10-CM | POA: Diagnosis not present

## 2020-08-03 DIAGNOSIS — M546 Pain in thoracic spine: Secondary | ICD-10-CM | POA: Diagnosis not present

## 2020-08-03 DIAGNOSIS — G8929 Other chronic pain: Secondary | ICD-10-CM | POA: Diagnosis not present

## 2020-08-03 DIAGNOSIS — R000 Tachycardia, unspecified: Secondary | ICD-10-CM | POA: Diagnosis not present

## 2020-08-03 LAB — BASIC METABOLIC PANEL
Anion gap: 9 (ref 5–15)
BUN: 10 mg/dL (ref 6–20)
CO2: 23 mmol/L (ref 22–32)
Calcium: 8.6 mg/dL — ABNORMAL LOW (ref 8.9–10.3)
Chloride: 105 mmol/L (ref 98–111)
Creatinine, Ser: 1.03 mg/dL — ABNORMAL HIGH (ref 0.44–1.00)
GFR, Estimated: >60 mL/min (ref >60)
Glucose, Bld: 105 mg/dL — ABNORMAL HIGH (ref 70–99)
Potassium: 4 mmol/L (ref 3.5–5.1)
Sodium: 137 mmol/L (ref 135–145)

## 2020-08-03 LAB — URINALYSIS, COMPLETE (UACMP) WITH MICROSCOPIC
Bilirubin Urine: NEGATIVE
Glucose, UA: NEGATIVE mg/dL
Ketones, ur: NEGATIVE mg/dL
Nitrite: NEGATIVE
Protein, ur: NEGATIVE mg/dL
Specific Gravity, Urine: 1.011 (ref 1.005–1.030)
pH: 5 (ref 5.0–8.0)

## 2020-08-03 LAB — CBC
HCT: 43.9 % (ref 36.0–46.0)
Hemoglobin: 14.2 g/dL (ref 12.0–15.0)
MCH: 28 pg (ref 26.0–34.0)
MCHC: 32.3 g/dL (ref 30.0–36.0)
MCV: 86.6 fL (ref 80.0–100.0)
Platelets: 313 10*3/uL (ref 150–400)
RBC: 5.07 MIL/uL (ref 3.87–5.11)
RDW: 12.8 % (ref 11.5–15.5)
WBC: 8.4 10*3/uL (ref 4.0–10.5)
nRBC: 0 % (ref 0.0–0.2)

## 2020-08-03 MED ORDER — MORPHINE SULFATE (PF) 4 MG/ML IV SOLN
4.0000 mg | Freq: Once | INTRAVENOUS | Status: AC
  Administered 2020-08-03: 4 mg via INTRAVENOUS
  Filled 2020-08-03: qty 1

## 2020-08-03 MED ORDER — ONDANSETRON HCL 4 MG/2ML IJ SOLN
4.0000 mg | Freq: Once | INTRAMUSCULAR | Status: AC
  Administered 2020-08-03: 4 mg via INTRAVENOUS
  Filled 2020-08-03: qty 2

## 2020-08-03 MED ORDER — LORAZEPAM 2 MG/ML IJ SOLN
1.0000 mg | INTRAMUSCULAR | Status: DC | PRN
  Filled 2020-08-03: qty 1

## 2020-08-03 NOTE — ED Provider Notes (Signed)
[facility] Emergency Department Provider Note   ____________________________________________   Event Date/Time   First MD Initiated Contact with Patient 08/03/20 1851     (approximate)  I have reviewed the triage vital signs and the nursing notes.   HISTORY  Chief Complaint Fall and Leg Problem    HPI Melissa [name] is a 50 y.o. female with past medical history of bipolar disorder, migraines, fibromyalgia, and POTS who presents to the ED complaining of leg pain.  Patient reports that over the past 3 to 4 days she has developed pain shooting down the posterior and lateral portion of her right leg.  She describes it as a constant and sharp pain that is worse with movement.  She deals with chronic pain in her low back that is unchanged and she denies any urinary retention/incontinence or saddle anesthesia.  She does have some numbness and tingling in the leg and feels like it is weaker than usual.  She was seen last week for pain in her upper back and neck that radiated down her right arm, diagnosed with cervical radiculopathy at that time.  She has follow-up scheduled with neurosurgery in 2 days.  She reports 2 falls over the past 2 days with her leg issues where she feels weak in the right leg and it seems to give out on her.        Past Medical History:  Diagnosis Date  . Arthritis   . Bipolar disorder ([place])   . Fibromyalgia    on disability  . Migraine   . Muscle spasm   . Pain of right side of body    chronic  . Panic attack   . POTS (postural orthostatic tachycardia syndrome)   . Syncope    recurrent  . Uterine perforation    hx of    Patient Active Problem List   Diagnosis Date Noted  . Thrombocytosis 03/09/2020  . Hyperglycemia 03/09/2020  . Pneumonia due to COVID-19 virus 03/03/2020  . Depression with anxiety 03/03/2020  . Sepsis ([place]) 03/03/2020  . Acute respiratory failure with hypoxia ([place])   . Chronic migraine without aura without  status migrainosus, not intractable 02/11/2020  . Anxiety 10/23/2019  . Dizziness 07/02/2019  . Tremor of both hands 03/27/2018  . Syncopal episodes 09/10/2017  . Essential hypertension 05/09/2017  . Menorrhagia 11/11/2016  . Alopecia 03/02/2016  . Bilateral cataracts 08/14/2015  . Acne 10/21/2014  . Liver lesion 09/09/2014  . Vitamin D deficiency 09/09/2014  . Hot flashes 06/10/2014  . Chronic pain syndrome 05/20/2014  . Vitamin B12 deficiency 03/12/2014  . Contraception management 09/20/2013  . IBS (irritable bowel syndrome) 09/20/2013  . Unspecified vitamin D deficiency 04/03/2013  . Dyslipidemia 04/03/2013  . Osteopenia 04/03/2013  . POTS (postural orthostatic tachycardia syndrome) 02/26/2013  . Migraine 11/29/2012  . Allergic rhinitis 10/05/2012  . Mixed bipolar I disorder in remission ([place]) 10/05/2012  . Combined fat and carbohydrate induced hyperlipemia 10/05/2012  . Fibromyalgia   . Bipolar disorder ([place])   . Fatigue 09/10/2011  . Clinical depression 09/10/2011  . Gastroesophageal reflux disease without esophagitis 09/10/2011  . Non-restorative sleep 09/10/2011  . Disturbance in sleep behavior 09/10/2011    Past Surgical History:  Procedure Laterality Date  . CATARACT EXTRACTION  dec 2014  . DILATION AND CURETTAGE OF UTERUS    . LASIK  dec 2014    Prior to Admission medications   Medication Sig Start Date End Date Taking? Authorizing Provider  acetaminophen (  TYLENOL) 500 MG tablet Take 1,000 mg by mouth daily as needed for mild pain.    [provider]  buPROPion (WELLBUTRIN XL) 150 MG 24 hr tablet Take 1 tablet (150 mg total) by mouth daily. 07/17/19   Lucille Passy, MD  cholecalciferol (VITAMIN D) 1000 units tablet Take 5,000 Units by mouth daily.    [provider]  clonazePAM (KLONOPIN) 1 MG tablet Take 1 tablet (1 mg total) by mouth 2 (two) times daily as needed. for anxiety 05/26/16   Lucille Passy, MD  cyclobenzaprine (FLEXERIL) 10 MG  tablet Take 10 mg by mouth 3 (three) times daily as needed for muscle spasms.  11/17/19   [provider]  fluvoxaMINE (LUVOX) 50 MG tablet Take 50-100 mg by mouth at bedtime.  05/23/17   [provider]  gabapentin (NEURONTIN) 600 MG tablet TAKE 1 TABLET BY MOUTH 3 TIMES DAILY 07/22/20   Masoud, Viann Shove, MD  Galcanezumab-gnlm [facility]) 120 MG/ML SOAJ Inject 120 mg into the skin every 30 (thirty) days. 02/11/20   Melvenia Beam, MD  ipratropium (ATROVENT HFA) 17 MCG/ACT inhaler Inhale 2 puffs into the lungs 4 (four) times daily for 10 days. 03/12/20 03/22/20  Enzo Bi, MD  lidocaine (LIDODERM) 5 % Place 1 patch onto the skin every 12 (twelve) hours. Remove & Discard patch within 12 hours or as directed by MD 07/29/20 07/29/21  Blake Divine, MD  norgestimate-ethinyl estradiol ([place] 28) 0.25-35 MG-MCG tablet Take 1 tablet by mouth daily. 07/17/19   Lucille Passy, MD  oxyCODONE-acetaminophen (PERCOCET) 5-325 MG tablet Take 1 tablet by mouth every 4 (four) hours as needed for severe pain. 07/29/20 07/29/21  Blake Divine, MD  rizatriptan (MAXALT-MLT) 10 MG disintegrating tablet Take 1 tablet (10 mg total) by mouth as needed for migraine. May repeat in 2 hours if needed 02/11/20   Melvenia Beam, MD  verapamil (CALAN-SR) 120 MG CR tablet Hold this until followup with outpatient doctor because your blood pressure has been normal without it in the hospital. 03/12/20   Enzo Bi, MD  vitamin B-12 (CYANOCOBALAMIN) 1000 MCG tablet Take 1,000 mcg by mouth daily.    [provider]    Allergies Lamotrigine, Abilify [aripiprazole], Azithromycin, Depakote [divalproex sodium], Erythromycin base, Lithium, Penicillins, Pregabalin, Seroquel [quetiapine fumarate], Sulfa antibiotics, Tetanus toxoids, Tramadol, and Ziprasidone hcl  Family History  Problem Relation Age of Onset  . Diabetes Mother   . Hypertension Mother   . Brain cancer Father   . Ovarian cancer Maternal Grandmother         metastasized  . Lung cancer Maternal Grandfather   . Cancer Paternal Grandmother        not sure of the type  . Cancer Paternal Grandfather        not sure of the type  . Migraines Neg Hx     Social History Social History   Tobacco Use  . Smoking status: Never Smoker  . Smokeless tobacco: Never Used  Vaping Use  . Vaping Use: Never used  Substance Use Topics  . Alcohol use: No    Alcohol/week: 0.0 standard drinks  . Drug use: No    Review of Systems  Constitutional: No fever/chills Eyes: No visual changes. ENT: No sore throat. Cardiovascular: Denies chest pain. Respiratory: Denies shortness of breath. Gastrointestinal: No abdominal pain.  No nausea, no vomiting.  No diarrhea.  No constipation. Genitourinary: Negative for dysuria. Musculoskeletal: Positive for for back pain. Skin: Negative for rash. Neurological: Negative  for headaches, positive for right leg numbness and weakness.  ____________________________________________   PHYSICAL EXAM:  VITAL SIGNS: ED Triage Vitals  Enc Vitals Group     BP 08/03/20 1444 100/78     Pulse Rate 08/03/20 1444 (!) 116     Resp 08/03/20 1444 16     Temp 08/03/20 1444 98.6 F (37 C)     Temp Source 08/03/20 1444 Oral     SpO2 08/03/20 1444 97 %     Weight 08/03/20 1445 155 lb (70.3 kg)     Height 08/03/20 1445 5\' 9"  (1.753 m)     Head Circumference --      Peak Flow --      Pain Score 08/03/20 1444 7     Pain Loc --      Pain Edu? --      Excl. in [place]? --     Constitutional: Alert and oriented. Eyes: Conjunctivae are normal. Head: Atraumatic. Nose: No congestion/rhinnorhea. Mouth/Throat: Mucous membranes are moist. Neck: Normal ROM Cardiovascular: Normal rate, regular rhythm. Grossly normal heart sounds.  2+ DP pulses bilaterally.  2+ radial pulses bilaterally. Respiratory: Normal respiratory effort.  No retractions. Lungs CTAB. Gastrointestinal: Soft and nontender. No distention. Genitourinary:  deferred Musculoskeletal: No lower extremity tenderness nor edema.  Midline tenderness in thoracic and lumbar spine. Neurologic:  Normal speech and language.  5 out of 5 strength in bilateral upper and lower extremities. Skin:  Skin is warm, dry and intact. No rash noted. Psychiatric: Mood and affect are normal. Speech and behavior are normal.  ____________________________________________   LABS (all labs ordered are listed, but only abnormal results are displayed)  Labs Reviewed  BASIC METABOLIC PANEL - Abnormal; Notable for the following components:      Result Value   Glucose, Bld 105 (*)    Creatinine, Ser 1.03 (*)    Calcium 8.6 (*)    All other components within normal limits  URINALYSIS, COMPLETE (UACMP) WITH MICROSCOPIC - Abnormal; Notable for the following components:   Color, Urine YELLOW (*)    APPearance HAZY (*)    Hgb urine dipstick SMALL (*)    Leukocytes,Ua MODERATE (*)    Bacteria, UA RARE (*)    All other components within normal limits  CBC  POC URINE PREG, ED   ____________________________________________  EKG  ED ECG REPORT I, Blake Divine, the attending physician, personally viewed and interpreted this ECG.   Date: 08/03/2020  EKG Time: 14:45  Rate: 114  Rhythm: sinus tachycardia  Axis: RAD  Intervals:none  ST&T Change: None   PROCEDURES  Procedure(s) performed (including Critical Care):  Procedures   ____________________________________________   INITIAL IMPRESSION / ASSESSMENT AND PLAN / ED COURSE       50 year old female with past medical history of bipolar disorder, migraines, fibromyalgia, and POTS who presents to the ED with pain shooting down her right leg for the past 3 to 4 days associated with weakness and 2 separate falls.  She reports hitting her head and CT head was performed and negative for acute process.  Lab work is unremarkable, EKG shows no evidence of arrhythmia or ischemia.  She was seen for similar symptoms  affecting her right arm last week, MRI of her cervical spine at that time was consistent with radiculopathy with no central stenosis.  MRI brain at that time was negative for stroke or other acute process.  Given symptoms now seem to be affecting her leg, we will check MRI of thoracic and  lumbar spine.  I doubt she would have developed significant central stenosis of her cervical spine since her prior MRI.  We will treat symptoms with morphine and Zofran, reassess following MRI.  MRI of thoracic spine is unremarkable, MRI of lumbar spine shows mild disc bulging but no significant central stenosis.  Patient is appropriate for discharge home with previously scheduled neurosurgery follow-up in 2 days.  She was counseled to return to the ED for any new or worsening symptoms, patient agrees with plan.      ____________________________________________   FINAL CLINICAL IMPRESSION(S) / ED DIAGNOSES  Final diagnoses:  Chronic midline low back pain without sciatica  Right leg pain     ED Discharge Orders    None       Note:  This document was prepared using Dragon voice recognition software and may include unintentional dictation errors.   Blake Divine, MD 08/03/20 2204

## 2020-08-03 NOTE — ED Triage Notes (Signed)
Patient presents to the ED post 2 falls today.  Patient states she noticed left leg numbness that has been increasing over the past week.  Patient states she had a lot of pain to left leg last night and today and has had two falls today due to problems with her leg.  Patient states with her second fall, approx. 2 hours before arrival she hit her head and since then she has noticed some vision changes to her left eye since her fall.  She states she is seeing floaters.

## 2020-08-05 DIAGNOSIS — M5412 Radiculopathy, cervical region: Secondary | ICD-10-CM | POA: Diagnosis not present

## 2020-08-08 DIAGNOSIS — Z113 Encounter for screening for infections with a predominantly sexual mode of transmission: Secondary | ICD-10-CM | POA: Diagnosis not present

## 2020-08-08 DIAGNOSIS — Z6823 Body mass index (BMI) 23.0-23.9, adult: Secondary | ICD-10-CM | POA: Diagnosis not present

## 2020-08-08 DIAGNOSIS — Z3041 Encounter for surveillance of contraceptive pills: Secondary | ICD-10-CM | POA: Diagnosis not present

## 2020-08-08 DIAGNOSIS — Z124 Encounter for screening for malignant neoplasm of cervix: Secondary | ICD-10-CM | POA: Diagnosis not present

## 2020-08-08 DIAGNOSIS — Z01419 Encounter for gynecological examination (general) (routine) without abnormal findings: Secondary | ICD-10-CM | POA: Diagnosis not present

## 2020-08-08 DIAGNOSIS — Z01411 Encounter for gynecological examination (general) (routine) with abnormal findings: Secondary | ICD-10-CM | POA: Diagnosis not present

## 2020-08-12 ENCOUNTER — Ambulatory Visit: Payer: Medicare HMO | Admitting: Neurology

## 2020-08-12 ENCOUNTER — Encounter: Payer: Self-pay | Admitting: Neurology

## 2020-08-12 VITALS — BP 113/70 | HR 96 | Ht 69.0 in | Wt 154.0 lb

## 2020-08-12 DIAGNOSIS — G43709 Chronic migraine without aura, not intractable, without status migrainosus: Secondary | ICD-10-CM | POA: Diagnosis not present

## 2020-08-12 MED ORDER — AJOVY 225 MG/1.5ML ~~LOC~~ SOAJ: 225.0000 mg | SUBCUTANEOUS | 0 refills | Status: DC

## 2020-08-12 NOTE — Progress Notes (Signed)
[facility]    Provider:  Dr Jaynee Eagles Requesting Provider: Cletis Athens, MD Primary Care Provider:  Cletis Athens, MD  CC:  Migraine  She did great on Emgality bu it is $85 and she can't afford it. She had covid in the meantime. We will give her 6 months of samples of Ajovy. She only had one migraine, we discussed aimovig but she is having hair loss due to covid so gave ajovy.   HPI:  Melissa [name] is a 50 y.o. female here as requested by Cletis Athens, MD for migraines. PMHx POTS, panic attack, migraine, fibromyalgia, bipolar, arthritis, HTN, IBS, depression, B12 deficiency, tremor, anxiety. She is having stress with her mother who is 56 and refuses to leave her home, so there is stress affecting. Been doing fine, restarted 3-4 months ago in the setting of stress, unilateral, mostly sound sensitivity and mild light sensitivity, movement makes it worse, nausea, no vomiting, getting a migraine every week now, can last 2-3 days, up to 12 migraine days a month for the last 3 months and pulsating/pounding, starts in the left in the back and radiates to the front, can be severe, a quiet room helps, OTC has not helped, hydrocodone has not helped significantly, had to go to the walk-in clinic recently, can affect her life and work. These are similar to prior migraines, vision is affected with the severe migraines, no new sensory changes or new vision changes, same quality and severity just more frequent, they can happen any time of the day. Not positional or exertional or anything of concern ut is the same exact migraine as always. No other focal neurologic deficits, associated symptoms, inciting events or modifiable factors.  Reviewed notes, labs and imaging from outside physicians, which showed:  Cbc/bmp unremarkable 06/26/2019  CT head 08/2018 showed No acute intracranial abnormalities including mass lesion or mass effect, hydrocephalus, extra-axial fluid collection, midline shift,  hemorrhage, or acute infarction, large ischemic events (personally reviewed images)  Medications that patient has use that can be used in migraine management include: Tylenol, Elavil, Wellbutrin, Tegretol, Flexeril, diclofenac tablet, Benadryl, Depakote, Prozac, gabapentin, Toradol injections, meclizine, Robaxin, Reglan injections, Zofran injections and tablets, prednisone tablets, Phenergan tablets and suppositories, Maxalt, Imitrex tablets and injections, tizanidine, tramadol, Effexor, verapamil, topamax, propranolol contraindicated due to POTS.    Review of Systems: Patient complains of symptoms per HPI as well as the following symptoms: hair loss. Pertinent negatives and positives per HPI. All others negative    Social History   Socioeconomic History  . Marital status: Married    Spouse name: Melissa [name]  . Number of children: 0  . Years of education: some college  . Highest education level: Not on file  Occupational History    Employer: OTHER    Comment: disabled  Tobacco Use  . Smoking status: Never Smoker  . Smokeless tobacco: Never Used  Vaping Use  . Vaping Use: Never used  Substance and Sexual Activity  . Alcohol use: No    Alcohol/week: 0.0 standard drinks  . Drug use: No  . Sexual activity: Yes    Birth control/protection: Surgical    Comment: also on OCPs  Other Topics Concern  . Not on file  Social History Narrative   08/28/19   From: [place], moved because her husband was from [facility]   Living: with husband [place], since 1990   Work: disability due to fibromyalgia and bipolar disorder      Family: mother in law is nearby, her mother is  in [place]; has a sister      Enjoys: crochet, baking, reading      Exercise: walking the dog, yoga - tries to do 3 times a week   Diet: not great, more sweets than she should      Safety   Seat belts: Yes    Guns: Yes  and secure   Safe in relationships: Yes          Right handed   Caffeine: none    Social Determinants of  Health   Financial Resource Strain: Not on file  Food Insecurity: Not on file  Transportation Needs: Not on file  Physical Activity: Not on file  Stress: Not on file  Social Connections: Not on file  Intimate Partner Violence: Not on file    Family History  Problem Relation Age of Onset  . Diabetes Mother   . Hypertension Mother   . Brain cancer Father   . Ovarian cancer Maternal Grandmother        metastasized  . Lung cancer Maternal Grandfather   . Cancer Paternal Grandmother        not sure of the type  . Cancer Paternal Grandfather        not sure of the type  . Migraines Neg Hx     Past Medical History:  Diagnosis Date  . Arthritis   . Bipolar disorder ([place])   . Fibromyalgia    on disability  . Migraine   . Muscle spasm   . Pain of right side of body    chronic  . Panic attack   . POTS (postural orthostatic tachycardia syndrome)   . Syncope    recurrent  . Uterine perforation    hx of    Patient Active Problem List   Diagnosis Date Noted  . Thrombocytosis 03/09/2020  . Hyperglycemia 03/09/2020  . Pneumonia due to COVID-19 virus 03/03/2020  . Depression with anxiety 03/03/2020  . Sepsis ([place]) 03/03/2020  . Acute respiratory failure with hypoxia ([place])   . Chronic migraine without aura without status migrainosus, not intractable 02/11/2020  . Anxiety 10/23/2019  . Dizziness 07/02/2019  . Tremor of both hands 03/27/2018  . Syncopal episodes 09/10/2017  . Essential hypertension 05/09/2017  . Menorrhagia 11/11/2016  . Alopecia 03/02/2016  . Bilateral cataracts 08/14/2015  . Acne 10/21/2014  . Liver lesion 09/09/2014  . Vitamin D deficiency 09/09/2014  . Hot flashes 06/10/2014  . Chronic pain syndrome 05/20/2014  . Vitamin B12 deficiency 03/12/2014  . Contraception management 09/20/2013  . IBS (irritable bowel syndrome) 09/20/2013  . Unspecified vitamin D deficiency 04/03/2013  . Dyslipidemia 04/03/2013  . Osteopenia 04/03/2013  . POTS (postural  orthostatic tachycardia syndrome) 02/26/2013  . Migraine 11/29/2012  . Allergic rhinitis 10/05/2012  . Mixed bipolar I disorder in remission ([place]) 10/05/2012  . Combined fat and carbohydrate induced hyperlipemia 10/05/2012  . Fibromyalgia   . Bipolar disorder ([place])   . Fatigue 09/10/2011  . Clinical depression 09/10/2011  . Gastroesophageal reflux disease without esophagitis 09/10/2011  . Non-restorative sleep 09/10/2011  . Disturbance in sleep behavior 09/10/2011    Past Surgical History:  Procedure Laterality Date  . CATARACT EXTRACTION  dec 2014  . DILATION AND CURETTAGE OF UTERUS    . LASIK  dec 2014    Current Outpatient Medications  Medication Sig Dispense Refill  . acetaminophen (TYLENOL) 500 MG tablet Take 1,000 mg by mouth daily as needed for mild pain.    Marland Kitchen buPROPion (WELLBUTRIN XL)  150 MG 24 hr tablet Take 1 tablet (150 mg total) by mouth daily. 90 tablet 3  . cholecalciferol (VITAMIN D) 1000 units tablet Take 5,000 Units by mouth daily.    . clonazePAM (KLONOPIN) 1 MG tablet Take 1 tablet (1 mg total) by mouth 2 (two) times daily as needed. for anxiety 60 tablet 0  . cyclobenzaprine (FLEXERIL) 10 MG tablet Take 10 mg by mouth 3 (three) times daily as needed for muscle spasms.     . fluvoxaMINE (LUVOX) 50 MG tablet Take 50-100 mg by mouth at bedtime.     . Fremanezumab-vfrm (AJOVY) 225 MG/1.5ML SOAJ Inject 225 mg into the skin every 30 (thirty) days. 9 mL 0  . gabapentin (NEURONTIN) 600 MG tablet TAKE 1 TABLET BY MOUTH 3 TIMES DAILY 90 tablet 3  . Galcanezumab-gnlm (EMGALITY) 120 MG/ML SOAJ Inject 120 mg into the skin every 30 (thirty) days. 1 mL 11  . lidocaine (LIDODERM) 5 % Place 1 patch onto the skin every 12 (twelve) hours. Remove & Discard patch within 12 hours or as directed by MD 10 patch 0  . norgestimate-ethinyl estradiol (SPRINTEC 28) 0.25-35 MG-MCG tablet Take 1 tablet by mouth daily. 6 Package 1  . oxyCODONE-acetaminophen (PERCOCET) 5-325 MG tablet Take 1  tablet by mouth every 4 (four) hours as needed for severe pain. 12 tablet 0  . rizatriptan (MAXALT-MLT) 10 MG disintegrating tablet Take 1 tablet (10 mg total) by mouth as needed for migraine. May repeat in 2 hours if needed 9 tablet 11  . verapamil (CALAN-SR) 120 MG CR tablet Hold this until followup with outpatient doctor because your blood pressure has been normal without it in the hospital. 90 tablet 3  . vitamin B-12 (CYANOCOBALAMIN) 1000 MCG tablet Take 1,000 mcg by mouth daily.     No current facility-administered medications for this visit.    Allergies as of 08/12/2020 - Review Complete 08/12/2020  Allergen Reaction Noted  . Lamotrigine Hives and Swelling 06/03/2015  . Abilify [aripiprazole] Other (See Comments) 07/17/2015  . Azithromycin Rash 09/12/2012  . Depakote [divalproex sodium] Other (See Comments) 11/28/2012  . Erythromycin base Swelling 01/03/2015  . Lithium Rash 11/28/2012  . Penicillins Rash 09/12/2012  . Pregabalin Palpitations 11/23/2013  . Seroquel [quetiapine fumarate] Other (See Comments) 11/28/2012  . Sulfa antibiotics Rash 09/12/2012  . Tetanus toxoids Other (See Comments) 06/03/2015  . Tramadol Nausea And Vomiting 03/12/2015  . Ziprasidone hcl Other (See Comments) 01/03/2015    Vitals: BP 113/70 (BP Location: Left Arm, Patient Position: Sitting)   Pulse 96   Ht 5\' 9"  (1.753 m)   Wt 154 lb (69.9 kg)   BMI 22.74 kg/m  Last Weight:  Wt Readings from Last 1 Encounters:  08/12/20 154 lb (69.9 kg)   Last Height:   Ht Readings from Last 1 Encounters:  08/12/20 5\' 9"  (1.753 m)   Exam: NAD, pleasant                  Speech:    Speech is normal; fluent and spontaneous with normal comprehension.  Cognition:    The patient is oriented to person, place, and time;     recent and remote memory intact;     language fluent;    Cranial Nerves:    The pupils are equal, round, and reactive to light.Trigeminal sensation is intact and the muscles of mastication  are normal. The face is symmetric. The palate elevates in the midline. Hearing intact. Voice is normal. Shoulder shrug is  normal. The tongue has normal motion without fasciculations.   Coordination:  No dysmetria  Motor Observation:    No asymmetry, no atrophy, and no involuntary movements noted. Tone:    Normal muscle tone.     Strength:    Strength is V/V in the upper and lower limbs.      Sensation: intact to LT    Assessment/Plan:  50 year old with chronic migraines recently worsening in the setting of stress. She has tried and failed multiple medications, we discussed options.   She did great on Emgality but it is $85 and she can't afford it. She had covid in the meantime. We will give her 6 months of samples of Ajovy. She only had one migraine, we discussed aimovig but she is having hair loss due to covid so gave ajovy.   Rizatriptan: Please take one tablet at the onset of your headache. If it does not improve the symptoms please take one additional tablet. Do not take more then 2 tablets in 24hrs. Do not take use more then 2 to 3 times in a week.  Discussed: To prevent or relieve headaches, try the following: Cool Compress. Lie down and place a cool compress on your head.  Avoid headache triggers. If certain foods or odors seem to have triggered your migraines in the past, avoid them. A headache diary might help you identify triggers.  Include physical activity in your daily routine. Try a daily walk or other moderate aerobic exercise.  Manage stress. Find healthy ways to cope with the stressors, such as delegating tasks on your to-do list.  Practice relaxation techniques. Try deep breathing, yoga, massage and visualization.  Eat regularly. Eating regularly scheduled meals and maintaining a healthy diet might help prevent headaches. Also, drink plenty of fluids.  Follow a regular sleep schedule. Sleep deprivation might contribute to headaches Consider biofeedback. With this  mind-body technique, you learn to control certain bodily functions -- such as muscle tension, heart rate and blood pressure -- to prevent headaches or reduce headache pain.    Proceed to emergency room if you experience new or worsening symptoms or symptoms do not resolve, if you have new neurologic symptoms or if headache is severe, or for any concerning symptom.   Provided education and documentation from American headache Society toolbox including articles on: chronic migraine medication overuse headache, chronic migraines, prevention of migraines, behavioral and other nonpharmacologic treatments for headache.  Meds ordered this encounter  Medications  . Fremanezumab-vfrm (AJOVY) 225 MG/1.5ML SOAJ    Sig: Inject 225 mg into the skin every 30 (thirty) days.    Dispense:  9 mL    Refill:  0    [id number] 10/2020    Cc: Cletis Athens, MD,  Cletis Athens, MD   I spent 20 minutes of face-to-face and non-face-to-face time with patient on the  1. Chronic migraine without aura without status migrainosus, not intractable    diagnosis.  This included previsit chart review, lab review, study review, order entry, electronic health record documentation, patient education on the different diagnostic and therapeutic options, counseling and coordination of care, risks and benefits of management, compliance, or risk factor reduction   Sarina Ill, MD  [facility] [address] [place], [place] [address]  Phone [phone] Fax [phone]

## 2020-08-21 DIAGNOSIS — M5020 Other cervical disc displacement, unspecified cervical region: Secondary | ICD-10-CM | POA: Diagnosis not present

## 2020-08-21 DIAGNOSIS — M5412 Radiculopathy, cervical region: Secondary | ICD-10-CM | POA: Diagnosis not present

## 2020-09-02 DIAGNOSIS — H524 Presbyopia: Secondary | ICD-10-CM | POA: Diagnosis not present

## 2020-09-02 DIAGNOSIS — H43392 Other vitreous opacities, left eye: Secondary | ICD-10-CM | POA: Diagnosis not present

## 2020-09-02 DIAGNOSIS — Z961 Presence of intraocular lens: Secondary | ICD-10-CM | POA: Diagnosis not present

## 2020-09-02 DIAGNOSIS — H52223 Regular astigmatism, bilateral: Secondary | ICD-10-CM | POA: Diagnosis not present

## 2020-09-02 DIAGNOSIS — H5203 Hypermetropia, bilateral: Secondary | ICD-10-CM | POA: Diagnosis not present

## 2020-09-02 DIAGNOSIS — H40029 Open angle with borderline findings, high risk, unspecified eye: Secondary | ICD-10-CM | POA: Diagnosis not present

## 2020-09-15 ENCOUNTER — Other Ambulatory Visit: Payer: Self-pay | Admitting: Internal Medicine

## 2020-09-15 DIAGNOSIS — M79641 Pain in right hand: Secondary | ICD-10-CM | POA: Diagnosis not present

## 2020-09-19 DIAGNOSIS — M5412 Radiculopathy, cervical region: Secondary | ICD-10-CM | POA: Diagnosis not present

## 2020-09-19 DIAGNOSIS — M5020 Other cervical disc displacement, unspecified cervical region: Secondary | ICD-10-CM | POA: Diagnosis not present

## 2020-09-19 DIAGNOSIS — M5030 Other cervical disc degeneration, unspecified cervical region: Secondary | ICD-10-CM | POA: Diagnosis not present

## 2020-09-23 ENCOUNTER — Other Ambulatory Visit: Payer: Self-pay | Admitting: Internal Medicine

## 2020-09-29 DIAGNOSIS — M5412 Radiculopathy, cervical region: Secondary | ICD-10-CM | POA: Diagnosis not present

## 2020-10-10 DIAGNOSIS — M5412 Radiculopathy, cervical region: Secondary | ICD-10-CM | POA: Diagnosis not present

## 2020-10-14 DIAGNOSIS — M5412 Radiculopathy, cervical region: Secondary | ICD-10-CM | POA: Diagnosis not present

## 2020-10-17 DIAGNOSIS — M5412 Radiculopathy, cervical region: Secondary | ICD-10-CM | POA: Diagnosis not present

## 2020-10-17 DIAGNOSIS — M5030 Other cervical disc degeneration, unspecified cervical region: Secondary | ICD-10-CM | POA: Diagnosis not present

## 2020-10-17 DIAGNOSIS — M5020 Other cervical disc displacement, unspecified cervical region: Secondary | ICD-10-CM | POA: Diagnosis not present

## 2020-10-25 ENCOUNTER — Other Ambulatory Visit: Payer: Self-pay

## 2020-10-25 ENCOUNTER — Emergency Department
Admission: EM | Admit: 2020-10-25 | Discharge: 2020-10-25 | Disposition: A | Discharge status: Home / Self Care | Payer: Medicare HMO | Attending: Emergency Medicine | Admitting: Emergency Medicine

## 2020-10-25 DIAGNOSIS — I10 Essential (primary) hypertension: Secondary | ICD-10-CM | POA: Diagnosis not present

## 2020-10-25 DIAGNOSIS — M79601 Pain in right arm: Secondary | ICD-10-CM | POA: Insufficient documentation

## 2020-10-25 DIAGNOSIS — R202 Paresthesia of skin: Secondary | ICD-10-CM | POA: Insufficient documentation

## 2020-10-25 DIAGNOSIS — M25511 Pain in right shoulder: Secondary | ICD-10-CM | POA: Diagnosis not present

## 2020-10-25 DIAGNOSIS — E860 Dehydration: Secondary | ICD-10-CM

## 2020-10-25 DIAGNOSIS — Z8616 Personal history of COVID-19: Secondary | ICD-10-CM | POA: Insufficient documentation

## 2020-10-25 DIAGNOSIS — M79604 Pain in right leg: Secondary | ICD-10-CM | POA: Diagnosis not present

## 2020-10-25 DIAGNOSIS — Z79899 Other long term (current) drug therapy: Secondary | ICD-10-CM | POA: Insufficient documentation

## 2020-10-25 DIAGNOSIS — R110 Nausea: Secondary | ICD-10-CM | POA: Insufficient documentation

## 2020-10-25 DIAGNOSIS — M542 Cervicalgia: Secondary | ICD-10-CM | POA: Insufficient documentation

## 2020-10-25 LAB — BASIC METABOLIC PANEL
Anion gap: 8 (ref 5–15)
BUN: 12 mg/dL (ref 6–20)
CO2: 21 mmol/L — ABNORMAL LOW (ref 22–32)
Calcium: 8.2 mg/dL — ABNORMAL LOW (ref 8.9–10.3)
Chloride: 109 mmol/L (ref 98–111)
Creatinine, Ser: 0.97 mg/dL (ref 0.44–1.00)
GFR, Estimated: >60 mL/min (ref >60)
Glucose, Bld: 84 mg/dL (ref 70–99)
Potassium: 3.9 mmol/L (ref 3.5–5.1)
Sodium: 138 mmol/L (ref 135–145)

## 2020-10-25 MED ORDER — KETOROLAC TROMETHAMINE 30 MG/ML IJ SOLN
30.0000 mg | Freq: Once | INTRAMUSCULAR | Status: AC
  Administered 2020-10-25: 30 mg via INTRAVENOUS
  Filled 2020-10-25: qty 1

## 2020-10-25 MED ORDER — ONDANSETRON 4 MG PO TBDP: 4.0000 mg | ORAL_TABLET | Freq: Three times a day (TID) | ORAL | 0 refills | Status: AC | PRN

## 2020-10-25 MED ORDER — OXYCODONE-ACETAMINOPHEN 5-325 MG PO TABS
1.0000 | ORAL_TABLET | Freq: Once | ORAL | Status: AC
  Administered 2020-10-25: 1 via ORAL
  Filled 2020-10-25: qty 1

## 2020-10-25 MED ORDER — ONDANSETRON 4 MG PO TBDP: 4.0000 mg | ORAL_TABLET | Freq: Three times a day (TID) | ORAL | 0 refills | Status: DC | PRN

## 2020-10-25 MED ORDER — LACTATED RINGERS IV BOLUS
1000.0000 mL | Freq: Once | INTRAVENOUS | Status: AC
  Administered 2020-10-25: 1000 mL via INTRAVENOUS

## 2020-10-25 MED ORDER — OXYCODONE-ACETAMINOPHEN 5-325 MG PO TABS: 1.0000 | ORAL_TABLET | Freq: Three times a day (TID) | ORAL | 0 refills | Status: AC | PRN

## 2020-10-25 MED ORDER — ONDANSETRON HCL 4 MG/2ML IJ SOLN
4.0000 mg | Freq: Once | INTRAMUSCULAR | Status: AC
  Administered 2020-10-25: 4 mg via INTRAVENOUS
  Filled 2020-10-25: qty 2

## 2020-10-25 NOTE — ED Triage Notes (Signed)
Pt to ED for concerns of neck pain now radiating down into back and down right shoulder. Pt reports hx of pinched nerve in her neck, had steroid injection done last Friday as well as seeing physical therapy. Reports pain is now worsening, no relief from all prescribed pain medications at home.

## 2020-10-25 NOTE — ED Provider Notes (Signed)
[facility] Emergency Department Provider Note  ____________________________________________   Event Date/Time   First MD Initiated Contact with Patient 10/25/20 [phone]     (approximate)  I have reviewed the triage vital signs and the nursing notes.   HISTORY  Chief Complaint Back Pain and Neck Pain   HPI Melissa [name] is a 50 y.o. female cervical radiculitis, POTS, panic attack, migraine, fibromyalgia, bipolar, arthritis, HTN, IBS, depression, B12 deficiency, tremor, anxiety and recent steroid injection on 4/29 for her cervical radiculitis who presents for assessment approximately 3 days of acute on chronic pain in her right shoulder radiating down her right arm as well as down her right leg.  Patient states she typically will have some pain rating down her right arm and intermittent numbness in her right hand but that is much worse than usual over the last 3 nights and that she almost never has rating down her right leg.  She states she has been taking Tylenol, Flexeril, lidocaine patches and heating pads but these have not helped.  States she has had some nausea from her pain whenever she is trying to take her pain medications and that because of this she has not been able to eat or drink much and has felt a little dizzy which she suspects is from her POTS getting a little dehydrated.  She has not had any loss of consciousness, chest pain, cough, shortness of breath abdominal pain, nausea or vomiting, diarrhea, dysuria, incontinence, headache, earache, sore throat, vertigo or any other clear acute associate symptoms.  No other acute concerns at this time.         Past Medical History:  Diagnosis Date  . Arthritis   . Bipolar disorder ([place])   . Fibromyalgia    on disability  . Migraine   . Muscle spasm   . Pain of right side of body    chronic  . Panic attack   . POTS (postural orthostatic tachycardia syndrome)   . Syncope    recurrent  . Uterine  perforation    hx of    Patient Active Problem List   Diagnosis Date Noted  . Thrombocytosis 03/09/2020  . Hyperglycemia 03/09/2020  . Pneumonia due to COVID-19 virus 03/03/2020  . Depression with anxiety 03/03/2020  . Sepsis ([place]) 03/03/2020  . Acute respiratory failure with hypoxia ([employer])   . Chronic migraine without aura without status migrainosus, not intractable 02/11/2020  . Anxiety 10/23/2019  . Dizziness 07/02/2019  . Tremor of both hands 03/27/2018  . Syncopal episodes 09/10/2017  . Essential hypertension 05/09/2017  . Menorrhagia 11/11/2016  . Alopecia 03/02/2016  . Bilateral cataracts 08/14/2015  . Acne 10/21/2014  . Liver lesion 09/09/2014  . Vitamin D deficiency 09/09/2014  . Hot flashes 06/10/2014  . Chronic pain syndrome 05/20/2014  . Vitamin B12 deficiency 03/12/2014  . Contraception management 09/20/2013  . IBS (irritable bowel syndrome) 09/20/2013  . Unspecified vitamin D deficiency 04/03/2013  . Dyslipidemia 04/03/2013  . Osteopenia 04/03/2013  . POTS (postural orthostatic tachycardia syndrome) 02/26/2013  . Migraine 11/29/2012  . Allergic rhinitis 10/05/2012  . Mixed bipolar I disorder in remission ([place]) 10/05/2012  . Combined fat and carbohydrate induced hyperlipemia 10/05/2012  . Fibromyalgia   . Bipolar disorder ([place])   . Fatigue 09/10/2011  . Clinical depression 09/10/2011  . Gastroesophageal reflux disease without esophagitis 09/10/2011  . Non-restorative sleep 09/10/2011  . Disturbance in sleep behavior 09/10/2011    Past Surgical History:  Procedure Laterality Date  .  CATARACT EXTRACTION  dec 2014  . DILATION AND CURETTAGE OF UTERUS    . LASIK  dec 2014    Prior to Admission medications   Medication Sig Start Date End Date Taking? Authorizing Provider  ondansetron (ZOFRAN ODT) 4 MG disintegrating tablet Take 1 tablet (4 mg total) by mouth every 8 (eight) hours as needed for up to 3 days for nausea or vomiting. 10/25/20 10/28/20 Yes Lucrezia Starch, MD  oxyCODONE-acetaminophen (PERCOCET) 5-325 MG tablet Take 1 tablet by mouth every 8 (eight) hours as needed for up to 3 days for severe pain. 10/25/20 10/28/20 Yes Lucrezia Starch, MD  acetaminophen (TYLENOL) 500 MG tablet Take 1,000 mg by mouth daily as needed for mild pain.    [provider]  buPROPion (WELLBUTRIN XL) 150 MG 24 hr tablet Take 1 tablet (150 mg total) by mouth daily. 07/17/19   Lucille Passy, MD  cholecalciferol (VITAMIN D) 1000 units tablet Take 5,000 Units by mouth daily.    [provider]  clonazePAM (KLONOPIN) 1 MG tablet Take 1 tablet (1 mg total) by mouth 2 (two) times daily as needed. for anxiety 05/26/16   Lucille Passy, MD  cyclobenzaprine (FLEXERIL) 10 MG tablet TAKE 1 TABLET BY MOUTH 3 TIMES A DAY AS NEEDED FOR MUSCLE SPASM 09/23/20   Cletis Athens, MD  fluvoxaMINE (LUVOX) 50 MG tablet Take 50-100 mg by mouth at bedtime.  05/23/17   [provider]  Fremanezumab-vfrm (AJOVY) 225 MG/1.5ML SOAJ Inject 225 mg into the skin every 30 (thirty) days. 08/12/20   Melvenia Beam, MD  gabapentin (NEURONTIN) 600 MG tablet TAKE 1 TABLET BY MOUTH 3 TIMES DAILY 07/22/20   Masoud, Viann Shove, MD  Galcanezumab-gnlm [facility]) 120 MG/ML SOAJ Inject 120 mg into the skin every 30 (thirty) days. 02/11/20   Melvenia Beam, MD  lidocaine (LIDODERM) 5 % Place 1 patch onto the skin every 12 (twelve) hours. Remove & Discard patch within 12 hours or as directed by MD 07/29/20 07/29/21  Blake Divine, MD  norgestimate-ethinyl estradiol ([place] 28) 0.25-35 MG-MCG tablet Take 1 tablet by mouth daily. 07/17/19   Lucille Passy, MD  rizatriptan (MAXALT-MLT) 10 MG disintegrating tablet Take 1 tablet (10 mg total) by mouth as needed for migraine. May repeat in 2 hours if needed 02/11/20   Melvenia Beam, MD  verapamil (CALAN-SR) 120 MG CR tablet TAKE ONE TABLET BY MOUTH AT BEDTIME 09/15/20   Cletis Athens, MD  vitamin B-12 (CYANOCOBALAMIN) 1000 MCG tablet Take 1,000 mcg by mouth  daily.    [provider]    Allergies Lamotrigine, Abilify [aripiprazole], Azithromycin, Depakote [divalproex sodium], Erythromycin base, Lithium, Penicillins, Pregabalin, Seroquel [quetiapine fumarate], Sulfa antibiotics, Tetanus toxoids, Tramadol, and Ziprasidone hcl  Family History  Problem Relation Age of Onset  . Diabetes Mother   . Hypertension Mother   . Brain cancer Father   . Ovarian cancer Maternal Grandmother        metastasized  . Lung cancer Maternal Grandfather   . Cancer Paternal Grandmother        not sure of the type  . Cancer Paternal Grandfather        not sure of the type  . Migraines Neg Hx     Social History Social History   Tobacco Use  . Smoking status: Never Smoker  . Smokeless tobacco: Never Used  Vaping Use  . Vaping Use: Never used  Substance Use Topics  . Alcohol use: No  Alcohol/week: 0.0 standard drinks  . Drug use: No    Review of Systems  Review of Systems  Constitutional: Positive for malaise/fatigue. Negative for chills and fever.  HENT: Negative for sore throat.   Eyes: Negative for pain.  Respiratory: Negative for cough and stridor.   Cardiovascular: Negative for chest pain.  Gastrointestinal: Positive for nausea. Negative for vomiting.  Genitourinary: Positive for dysuria.  Musculoskeletal: Positive for back pain, myalgias and neck pain.  Skin: Negative for rash.  Neurological: Positive for dizziness. Negative for seizures, loss of consciousness and headaches.  Psychiatric/Behavioral: Negative for suicidal ideas.  All other systems reviewed and are negative.     ____________________________________________   PHYSICAL EXAM:  VITAL SIGNS: ED Triage Vitals  Enc Vitals Group     BP      Pulse      Resp      Temp      Temp src      SpO2      Weight      Height      Head Circumference      Peak Flow      Pain Score      Pain Loc      Pain Edu?      Excl. in [place]?    Vitals:   10/25/20 0446 10/25/20  0525  BP:  (!) 99/59  Pulse:  79  Resp:  14  Temp: 98.3 F (36.8 C)   SpO2:  100%   Physical Exam Vitals and nursing note reviewed.  Constitutional:      General: She is not in acute distress.    Appearance: She is well-developed.  HENT:     Head: Normocephalic and atraumatic.     Right Ear: External ear normal.     Left Ear: External ear normal.     Nose: Nose normal.  Eyes:     Conjunctiva/sclera: Conjunctivae normal.  Cardiovascular:     Rate and Rhythm: Normal rate and regular rhythm.     Heart sounds: No murmur heard.   Pulmonary:     Effort: Pulmonary effort is normal. No respiratory distress.     Breath sounds: Normal breath sounds.  Abdominal:     Palpations: Abdomen is soft.     Tenderness: There is no abdominal tenderness.  Musculoskeletal:     Cervical back: Neck supple.  Skin:    General: Skin is warm and dry.     Capillary Refill: Capillary refill takes less than 2 seconds.  Neurological:     Mental Status: She is alert and oriented to person, place, and time.  Psychiatric:        Mood and Affect: Mood normal.     Patient has full strength and sensation throughout her bilateral upper and lower extremities including the distribution of the radial ulnar and median nerves in the right upper extremity.  Patient's neck has no induration erythema fluctuance warmth or point tenderness and she is not meningitic. ____________________________________________   LABS (all labs ordered are listed, but only abnormal results are displayed)  Labs Reviewed  BASIC METABOLIC PANEL - Abnormal; Notable for the following components:      Result Value   CO2 21 (*)    Calcium 8.2 (*)    All other components within normal limits   ____________________________________________  EKG  Sinus rhythm with ventricular rate of 77, normal axis, unremarkable intervals and no clear evidence of acute ischemia or other significant underlying arrhythmia.   ____________________________________________  RADIOLOGY  ED MD interpretation:   Official radiology report(s): No results found.  ____________________________________________   PROCEDURES  Procedure(s) performed (including Critical Care):  Procedures   ____________________________________________   INITIAL IMPRESSION / ASSESSMENT AND PLAN / ED COURSE        Patient presents with above-stated history and exam for assessment of some acute on chronic right-sided neck pain rating down her right arm as well as some paresthesias in the right arm.  She now has some pain rating down her right leg which is abnormal for her.  Seems her symptoms have been going on over the last 3 days and started after recent right-sided cervical neck injection.  She has history of pots and has been a little nauseous over the last couple days think she is little dehydrated as well because she has not been able to eat or drink much from her nausea.  On arrival she is afebrile with borderline low diastolic pressure at at [id number] with otherwise stable vital signs on room air.  Exam of her neck shows no overlying skin changes decreased range of motion or other findings suggest a cellulitis or abscess.  She has no evidence of focal deficit or objective sensory deficit to suggest acute cord compression or acute cervical nerve compression.  Shoulder itself is unremarkable without effusion or decreased range of motion and exam is not consistent with a septic joint.  Patient states she was told that she should expect her pain to get a little worse before it gets better certainly possible her pain is secondary to recent injection versus progression of her radiculopathy.  Low suspicion for acute cord compression at this time.  Given concern for some mild dehydration patient treated with IV fluids and Zofran.  Following this she was able tolerate p.o.  Will write short course of Zofran and instruct patient to make sure she is  staying adequately hydrated as suspect her decreased p.o. intake is likely why she has been little to the last couple days.  BMP shows no significant electrolyte or metabolic derangements.  Patient given a dose of Toradol and 1 Percocet emergency room as well as some IV Zofran.  She stated her nausea was much improved on assessment was able to tolerate p.o.  Chest x-ray her pain is much improved.  Given she has not been able to sleep last 3 nights taking car Flexeril Tylenol and lidocaine patch will prescribe 2 days worth of Percocet until she can see physician in 48 hours on 5/9.  Also written Rx for Zofran.  Discharged stable condition.  Strict return precautions advised and discussed.       ____________________________________________   FINAL CLINICAL IMPRESSION(S) / ED DIAGNOSES  Final diagnoses:  Neck pain  Dehydration    Medications  ketorolac (TORADOL) 30 MG/ML injection 30 mg (has no administration in time range)  lactated ringers bolus 1,000 mL (1,000 mLs Intravenous New Bag/Given 10/25/20 0514)  ondansetron (ZOFRAN) injection 4 mg (4 mg Intravenous Given 10/25/20 0511)  oxyCODONE-acetaminophen (PERCOCET/ROXICET) 5-325 MG per tablet 1 tablet (1 tablet Oral Given 10/25/20 0515)     ED Discharge Orders         Ordered    oxyCODONE-acetaminophen (PERCOCET) 5-325 MG tablet  Every 8 hours PRN        10/25/20 0555    ondansetron (ZOFRAN ODT) 4 MG disintegrating tablet  Every 8 hours PRN        10/25/20 0555  Note:  This document was prepared using Dragon voice recognition software and may include unintentional dictation errors.   Lucrezia Starch, MD 10/25/20 [phone]

## 2020-10-27 DIAGNOSIS — F411 Generalized anxiety disorder: Secondary | ICD-10-CM | POA: Diagnosis not present

## 2020-10-27 DIAGNOSIS — M5020 Other cervical disc displacement, unspecified cervical region: Secondary | ICD-10-CM | POA: Diagnosis not present

## 2020-10-27 DIAGNOSIS — F3132 Bipolar disorder, current episode depressed, moderate: Secondary | ICD-10-CM | POA: Diagnosis not present

## 2020-10-27 DIAGNOSIS — M5030 Other cervical disc degeneration, unspecified cervical region: Secondary | ICD-10-CM | POA: Diagnosis not present

## 2020-10-27 DIAGNOSIS — M5412 Radiculopathy, cervical region: Secondary | ICD-10-CM | POA: Diagnosis not present

## 2020-11-06 ENCOUNTER — Ambulatory Visit: Payer: Medicare HMO | Admitting: Family Medicine

## 2020-11-07 ENCOUNTER — Other Ambulatory Visit: Payer: Self-pay | Admitting: Internal Medicine

## 2020-11-09 ENCOUNTER — Emergency Department
Admission: EM | Admit: 2020-11-09 | Discharge: 2020-11-09 | Disposition: A | Discharge status: Home / Self Care | Payer: Medicare HMO | Attending: Emergency Medicine | Admitting: Emergency Medicine

## 2020-11-09 ENCOUNTER — Encounter: Payer: Self-pay | Admitting: Emergency Medicine

## 2020-11-09 ENCOUNTER — Other Ambulatory Visit: Payer: Self-pay

## 2020-11-09 DIAGNOSIS — R42 Dizziness and giddiness: Secondary | ICD-10-CM | POA: Diagnosis not present

## 2020-11-09 DIAGNOSIS — F419 Anxiety disorder, unspecified: Secondary | ICD-10-CM | POA: Diagnosis not present

## 2020-11-09 DIAGNOSIS — I159 Secondary hypertension, unspecified: Secondary | ICD-10-CM

## 2020-11-09 DIAGNOSIS — I10 Essential (primary) hypertension: Secondary | ICD-10-CM | POA: Insufficient documentation

## 2020-11-09 DIAGNOSIS — Z79899 Other long term (current) drug therapy: Secondary | ICD-10-CM | POA: Diagnosis not present

## 2020-11-09 LAB — TROPONIN I (HIGH SENSITIVITY): Troponin I (High Sensitivity): <2 ng/L (ref <18)

## 2020-11-09 LAB — CBC
HCT: 38.9 % (ref 36.0–46.0)
Hemoglobin: 13.2 g/dL (ref 12.0–15.0)
MCH: 29.7 pg (ref 26.0–34.0)
MCHC: 33.9 g/dL (ref 30.0–36.0)
MCV: 87.6 fL (ref 80.0–100.0)
Platelets: 267 10*3/uL (ref 150–400)
RBC: 4.44 MIL/uL (ref 3.87–5.11)
RDW: 13.1 % (ref 11.5–15.5)
WBC: 7.3 10*3/uL (ref 4.0–10.5)
nRBC: 0 % (ref 0.0–0.2)

## 2020-11-09 MED ORDER — SODIUM CHLORIDE 0.9 % IV BOLUS
500.0000 mL | Freq: Once | INTRAVENOUS | Status: AC
  Administered 2020-11-09: 500 mL via INTRAVENOUS

## 2020-11-09 NOTE — ED Provider Notes (Addendum)
[facility] Emergency Department Provider Note  ____________________________________________  Time seen: Approximately 7:59 AM  I have reviewed the triage vital signs and the nursing notes.   HISTORY  Chief Complaint Hypertension and Anxiety    HPI Melissa [name] is a 50 y.o. female with a history of anxiety, fibromyalgia, bipolar disorder who comes the ED complaining of elevated blood pressure and feeling stressed.  Within the past week her mother-in-law has become very ill and passed away, and now there is conflict in the family as a result.  She has been compliant with her medications.  Denies chest pain shortness of breath or exertional symptoms.  No belly pain back pain vomiting or diarrhea.  Eating and drinking normally.  She sometimes feels lightheaded when she stands up.  No vision changes, paresthesias or weakness.  At home she noted that her blood pressure seemed very high which made her more worried.      Past Medical History:  Diagnosis Date  . Arthritis   . Bipolar disorder ([place])   . Fibromyalgia    on disability  . Migraine   . Muscle spasm   . Pain of right side of body    chronic  . Panic attack   . POTS (postural orthostatic tachycardia syndrome)   . Syncope    recurrent  . Uterine perforation    hx of     Patient Active Problem List   Diagnosis Date Noted  . Thrombocytosis 03/09/2020  . Hyperglycemia 03/09/2020  . Pneumonia due to COVID-19 virus 03/03/2020  . Depression with anxiety 03/03/2020  . Sepsis ([place]) 03/03/2020  . Acute respiratory failure with hypoxia ([place])   . Chronic migraine without aura without status migrainosus, not intractable 02/11/2020  . Anxiety 10/23/2019  . Dizziness 07/02/2019  . Tremor of both hands 03/27/2018  . Syncopal episodes 09/10/2017  . Essential hypertension 05/09/2017  . Menorrhagia 11/11/2016  . Alopecia 03/02/2016  . Bilateral cataracts 08/14/2015  . Acne 10/21/2014  . Liver lesion  09/09/2014  . Vitamin D deficiency 09/09/2014  . Hot flashes 06/10/2014  . Chronic pain syndrome 05/20/2014  . Vitamin B12 deficiency 03/12/2014  . Contraception management 09/20/2013  . IBS (irritable bowel syndrome) 09/20/2013  . Unspecified vitamin D deficiency 04/03/2013  . Dyslipidemia 04/03/2013  . Osteopenia 04/03/2013  . POTS (postural orthostatic tachycardia syndrome) 02/26/2013  . Migraine 11/29/2012  . Allergic rhinitis 10/05/2012  . Mixed bipolar I disorder in remission ([place]) 10/05/2012  . Combined fat and carbohydrate induced hyperlipemia 10/05/2012  . Fibromyalgia   . Bipolar disorder ([place])   . Fatigue 09/10/2011  . Clinical depression 09/10/2011  . Gastroesophageal reflux disease without esophagitis 09/10/2011  . Non-restorative sleep 09/10/2011  . Disturbance in sleep behavior 09/10/2011     Past Surgical History:  Procedure Laterality Date  . CATARACT EXTRACTION  dec 2014  . DILATION AND CURETTAGE OF UTERUS    . LASIK  dec 2014     Prior to Admission medications   Medication Sig Start Date End Date Taking? Authorizing Provider  acetaminophen (TYLENOL) 500 MG tablet Take 1,000 mg by mouth daily as needed for mild pain.    [provider]  buPROPion (WELLBUTRIN XL) 150 MG 24 hr tablet Take 1 tablet (150 mg total) by mouth daily. 07/17/19   Lucille Passy, MD  cholecalciferol (VITAMIN D) 1000 units tablet Take 5,000 Units by mouth daily.    [provider]  clonazePAM (KLONOPIN) 1 MG tablet Take 1 tablet (  1 mg total) by mouth 2 (two) times daily as needed. for anxiety 05/26/16   Lucille Passy, MD  cyclobenzaprine (FLEXERIL) 10 MG tablet TAKE 1 TABLET BY MOUTH 3 TIMES A DAY AS NEEDED FOR MUSCLE SPASM 11/07/20   Beckie Salts, FNP  fluvoxaMINE (LUVOX) 50 MG tablet Take 50-100 mg by mouth at bedtime.  05/23/17   [provider]  Fremanezumab-vfrm (AJOVY) 225 MG/1.5ML SOAJ Inject 225 mg into the skin every 30 (thirty) days. 08/12/20   Melvenia Beam, MD  gabapentin (NEURONTIN) 600 MG tablet TAKE 1 TABLET BY MOUTH 3 TIMES DAILY 07/22/20   Masoud, Viann Shove, MD  Galcanezumab-gnlm [facility]) 120 MG/ML SOAJ Inject 120 mg into the skin every 30 (thirty) days. 02/11/20   Melvenia Beam, MD  lidocaine (LIDODERM) 5 % Place 1 patch onto the skin every 12 (twelve) hours. Remove & Discard patch within 12 hours or as directed by MD 07/29/20 07/29/21  Blake Divine, MD  norgestimate-ethinyl estradiol ([place] 28) 0.25-35 MG-MCG tablet Take 1 tablet by mouth daily. 07/17/19   Lucille Passy, MD  rizatriptan (MAXALT-MLT) 10 MG disintegrating tablet Take 1 tablet (10 mg total) by mouth as needed for migraine. May repeat in 2 hours if needed 02/11/20   Melvenia Beam, MD  verapamil (CALAN-SR) 120 MG CR tablet TAKE ONE TABLET BY MOUTH AT BEDTIME 09/15/20   Cletis Athens, MD  vitamin B-12 (CYANOCOBALAMIN) 1000 MCG tablet Take 1,000 mcg by mouth daily.    [provider]     Allergies Lamotrigine, Abilify [aripiprazole], Azithromycin, Depakote [divalproex sodium], Erythromycin base, Lithium, Penicillins, Pregabalin, Seroquel [quetiapine fumarate], Sulfa antibiotics, Tetanus toxoids, Tramadol, and Ziprasidone hcl   Family History  Problem Relation Age of Onset  . Diabetes Mother   . Hypertension Mother   . Brain cancer Father   . Ovarian cancer Maternal Grandmother        metastasized  . Lung cancer Maternal Grandfather   . Cancer Paternal Grandmother        not sure of the type  . Cancer Paternal Grandfather        not sure of the type  . Migraines Neg Hx     Social History Social History   Tobacco Use  . Smoking status: Never Smoker  . Smokeless tobacco: Never Used  Vaping Use  . Vaping Use: Never used  Substance Use Topics  . Alcohol use: No    Alcohol/week: 0.0 standard drinks  . Drug use: No    Review of Systems  Constitutional:   No fever or chills.  ENT:   No sore throat. No rhinorrhea. Cardiovascular:   No chest  pain or syncope. Respiratory:   No dyspnea or cough. Gastrointestinal:   Negative for abdominal pain, vomiting and diarrhea.  Musculoskeletal:   Chronic right neck and right arm pain, seeing PCP and neurology All other systems reviewed and are negative except as documented above in ROS and HPI.  ____________________________________________   PHYSICAL EXAM:  VITAL SIGNS: ED Triage Vitals  Enc Vitals Group     BP 11/09/20 0410 135/70     Pulse Rate 11/09/20 0410 96     Resp 11/09/20 0410 18     Temp 11/09/20 0407 98.6 F (37 C)     Temp Source 11/09/20 0407 Oral     SpO2 11/09/20 0410 100 %     Weight 11/09/20 0409 149 lb 14.6 oz (68 kg)     Height 11/09/20 0409 5\' 9"  (1.753 m)  Head Circumference --      Peak Flow --      Pain Score 11/09/20 0409 6     Pain Loc --      Pain Edu? --      Excl. in [place]? --     Vital signs reviewed, nursing assessments reviewed.   Constitutional:   Alert and oriented. Non-toxic appearance. Eyes:   Conjunctivae are normal. EOMI. PERRL. ENT      Head:   Normocephalic and atraumatic.      Nose: Normal.      Mouth/Throat: Moist mucosa      Neck:   No meningismus. Full ROM.  Thyroid nonpalpable.  Nontender Hematological/Lymphatic/Immunilogical:   No cervical lymphadenopathy. Cardiovascular:   RRR. Symmetric bilateral radial and DP pulses.  No murmurs. Cap refill less than 2 seconds. Respiratory:   Normal respiratory effort without tachypnea/retractions. Breath sounds are clear and equal bilaterally. No wheezes/rales/rhonchi. Gastrointestinal:   Soft and nontender. Non distended. There is no CVA tenderness.  No rebound, rigidity, or guarding. Genitourinary:   deferred Musculoskeletal:   Normal range of motion in all extremities. No joint effusions.  No lower extremity tenderness.  No edema. Neurologic:   Normal speech and language.  Motor grossly intact. No acute focal neurologic deficits are appreciated.  Skin:    Skin is warm, dry and  intact. No rash noted.  No petechiae, purpura, or bullae.  ____________________________________________    LABS (pertinent positives/negatives) (all labs ordered are listed, but only abnormal results are displayed) Labs Reviewed  CBC  BASIC METABOLIC PANEL  TROPONIN I (HIGH SENSITIVITY)   ____________________________________________   EKG  Interpreted by me Normal sinus rhythm rate of 82, right axis, normal intervals.  Normal QRS ST segments and T waves.  ____________________________________________    RADIOLOGY  No results found.  ____________________________________________   PROCEDURES Procedures  ____________________________________________  DIFFERENTIAL DIAGNOSIS   Dehydration, electrolyte abnormality, anemia, AKI  CLINICAL IMPRESSION / ASSESSMENT AND PLAN / ED COURSE  Medications ordered in the ED: Medications  sodium chloride 0.9 % bolus 500 mL (500 mLs Intravenous New Bag/Given 11/09/20 0757)    Pertinent labs & imaging results that were available during my care of the patient were reviewed by me and considered in my medical decision making (see chart for details).  Melissa [name] was evaluated in Emergency Department on 11/09/2020 for the symptoms described in the history of present illness. She was evaluated in the context of the global COVID-19 pandemic, which necessitated consideration that the patient might be at risk for infection with the SARS-CoV-2 virus that causes COVID-19. Institutional protocols and algorithms that pertain to the evaluation of patients at risk for COVID-19 are in a state of rapid change based on information released by regulatory bodies including the CDC and federal and state organizations. These policies and algorithms were followed during the patient's care in the ED.   Patient presents with vague symptoms of feeling stressed and anxious, reporting high blood pressure at home.  Here her vital signs are normal, blood pressure is  normal.  She is nontoxic, well-appearing, normal physical exam.  EKG unremarkable.  Symptoms are noncardiac.  Will check screening labs and give IV fluids for hydration and symptomatic relief.  Anticipate discharge home to outpatient follow-up.  ----------------------------------------- 9:14 AM on 11/09/2020 -----------------------------------------  Patient left AMA while waiting for lab results.  CBC and troponin normal.  Chemistry not resulted.  Overall the evaluation is reassuring, she has medical decision-making capacity.  She felt  she needed to leave right away to keep an appointment to pick out a casket for her mother-in-law.      ____________________________________________   FINAL CLINICAL IMPRESSION(S) / ED DIAGNOSES    Final diagnoses:  Secondary hypertension  Anxiety     ED Discharge Orders    None      Portions of this note were generated with dragon dictation software. Dictation errors may occur despite best attempts at proofreading.   Carrie Mew, MD 11/09/20 [id number]    Carrie Mew, MD 11/09/20 [phone]

## 2020-11-09 NOTE — ED Triage Notes (Signed)
Pt reports that she has a history of HTN, she has medication that usually keeps it under control. Her mother in law passed away and is under stress because the family is fighting. She also reports that she had shoulder problems and is pain from that. She knows that this is what is causing her B/P to be high. She just is anxious because of the B/P

## 2020-11-09 NOTE — ED Notes (Signed)
Janace Hoard EDT called this RN to bedside. Pt stated that she needed to leave to go to the funeral home with family. Informed pt that she is welcome to come back if she feels worse. Pt verbalized understanding of leaving before care complete. Pt ambulated toward lobby with steady gait. No distress noted upon leaving AMA.

## 2020-11-09 NOTE — ED Notes (Signed)
Light green redraw sent to lab

## 2020-11-14 ENCOUNTER — Encounter: Payer: Self-pay | Admitting: Family Medicine

## 2020-11-14 ENCOUNTER — Ambulatory Visit (INDEPENDENT_AMBULATORY_CARE_PROVIDER_SITE_OTHER): Payer: Medicare HMO | Admitting: Family Medicine

## 2020-11-14 ENCOUNTER — Other Ambulatory Visit: Payer: Self-pay

## 2020-11-14 VITALS — BP 132/72 | HR 93 | Ht 69.0 in | Wt 155.6 lb

## 2020-11-14 DIAGNOSIS — M5410 Radiculopathy, site unspecified: Secondary | ICD-10-CM

## 2020-11-14 MED ORDER — BACLOFEN 10 MG PO TABS: 10.0000 mg | ORAL_TABLET | Freq: Three times a day (TID) | ORAL | 0 refills | Status: DC

## 2020-11-14 NOTE — Assessment & Plan Note (Signed)
For 3 months patient has had worsening r arm Radiculopathy that she has been treated by [facility] Neuro[facility] and received 2  Injections, after the 2 nd injection her symptoms had worsened.  She is requesting a second opinion on options including surgery options.

## 2020-11-14 NOTE — Progress Notes (Signed)
Established Patient Office Visit  SUBJECTIVE:  Subjective  Patient ID: Melissa [name], female    DOB: 22-Jul-1970  Age: 50 y.o. MRN: [id number]  CC:  Chief Complaint  Patient presents with  . pinched nerve    Patient has pinched nerve in neck and has been seen by neurosurgeon, but would like to be referred for second opinion,     HP Melissa [name] is a 50 y.o. female presenting today for     Past Medical History:  Diagnosis Date  . Arthritis   . Bipolar disorder ([place])   . Fibromyalgia    on disability  . Migraine   . Muscle spasm   . Pain of right side of body    chronic  . Panic attack   . POTS (postural orthostatic tachycardia syndrome)   . Syncope    recurrent  . Uterine perforation    hx of    Past Surgical History:  Procedure Laterality Date  . CATARACT EXTRACTION  dec 2014  . DILATION AND CURETTAGE OF UTERUS    . LASIK  dec 2014    Family History  Problem Relation Age of Onset  . Diabetes Mother   . Hypertension Mother   . Brain cancer Father   . Ovarian cancer Maternal Grandmother        metastasized  . Lung cancer Maternal Grandfather   . Cancer Paternal Grandmother        not sure of the type  . Cancer Paternal Grandfather        not sure of the type  . Migraines Neg Hx     Social History   Socioeconomic History  . Marital status: Married    Spouse name: Izell Wenden  . Number of children: 0  . Years of education: some college  . Highest education level: Not on file  Occupational History    Employer: OTHER    Comment: disabled  Tobacco Use  . Smoking status: Never Smoker  . Smokeless tobacco: Never Used  Vaping Use  . Vaping Use: Never used  Substance and Sexual Activity  . Alcohol use: No    Alcohol/week: 0.0 standard drinks  . Drug use: No  . Sexual activity: Yes    Birth control/protection: Surgical    Comment: also on OCPs  Other Topics Concern  . Not on file  Social History Narrative   08/28/19   From: [place], moved because  her husband was from [facility]   Living: with husband Izell Ferguson, since 1990   Work: disability due to fibromyalgia and bipolar disorder      Family: mother in law is nearby, her mother is in [place]; has a sister      Enjoys: crochet, baking, reading      Exercise: walking the dog, yoga - tries to do 3 times a week   Diet: not great, more sweets than she should      Safety   Seat belts: Yes    Guns: Yes  and secure   Safe in relationships: Yes          Right handed   Caffeine: none    Social Determinants of Health   Financial Resource Strain: Not on file  Food Insecurity: Not on file  Transportation Needs: Not on file  Physical Activity: Not on file  Stress: Not on file  Social Connections: Not on file  Intimate Partner Violence: Not on file     Current Outpatient Medications:  .  acetaminophen (  TYLENOL) 500 MG tablet, Take 1,000 mg by mouth daily as needed for mild pain., Disp: , Rfl:  .  baclofen (LIORESAL) 10 MG tablet, Take 1 tablet (10 mg total) by mouth 3 (three) times daily., Disp: 30 each, Rfl: 0 .  buPROPion (WELLBUTRIN XL) 150 MG 24 hr tablet, Take 1 tablet (150 mg total) by mouth daily., Disp: 90 tablet, Rfl: 3 .  cholecalciferol (VITAMIN D) 1000 units tablet, Take 5,000 Units by mouth daily., Disp: , Rfl:  .  clonazePAM (KLONOPIN) 1 MG tablet, Take 1 tablet (1 mg total) by mouth 2 (two) times daily as needed. for anxiety, Disp: 60 tablet, Rfl: 0 .  cyclobenzaprine (FLEXERIL) 10 MG tablet, TAKE 1 TABLET BY MOUTH 3 TIMES A DAY AS NEEDED FOR MUSCLE SPASM, Disp: 90 tablet, Rfl: 0 .  fluvoxaMINE (LUVOX) 50 MG tablet, Take 50-100 mg by mouth at bedtime. , Disp: , Rfl:  .  Fremanezumab-vfrm (AJOVY) 225 MG/1.5ML SOAJ, Inject 225 mg into the skin every 30 (thirty) days., Disp: 9 mL, Rfl: 0 .  gabapentin (NEURONTIN) 600 MG tablet, TAKE 1 TABLET BY MOUTH 3 TIMES DAILY, Disp: 90 tablet, Rfl: 3 .  Galcanezumab-gnlm (EMGALITY) 120 MG/ML SOAJ, Inject 120 mg into the skin every 30 (thirty)  days., Disp: 1 mL, Rfl: 11 .  lidocaine (LIDODERM) 5 %, Place 1 patch onto the skin every 12 (twelve) hours. Remove & Discard patch within 12 hours or as directed by MD, Disp: 10 patch, Rfl: 0 .  norgestimate-ethinyl estradiol (SPRINTEC 28) 0.25-35 MG-MCG tablet, Take 1 tablet by mouth daily., Disp: 6 Package, Rfl: 1 .  rizatriptan (MAXALT-MLT) 10 MG disintegrating tablet, Take 1 tablet (10 mg total) by mouth as needed for migraine. May repeat in 2 hours if needed, Disp: 9 tablet, Rfl: 11 .  verapamil (CALAN-SR) 120 MG CR tablet, TAKE ONE TABLET BY MOUTH AT BEDTIME, Disp: 90 tablet, Rfl: 3 .  vitamin B-12 (CYANOCOBALAMIN) 1000 MCG tablet, Take 1,000 mcg by mouth daily., Disp: , Rfl:    Allergies  Allergen Reactions  . Lamotrigine Hives and Swelling  . Abilify [Aripiprazole] Other (See Comments)    Weight gain, did not help,   . Azithromycin Rash  . Depakote [Divalproex Sodium] Other (See Comments)    Hair loss  . Erythromycin Base Swelling  . Lithium Rash  . Penicillins Rash    Has patient had a PCN reaction causing immediate rash, facial/tongue/throat swelling, SOB or lightheadedness with hypotension: Yes- swelling, rash  Has patient had a PCN reaction causing severe rash involving mucus membranes or skin necrosis: Yes Has patient had a PCN reaction that required hospitalization No Has patient had a PCN reaction occurring within the last 10 years: No If all of the above answers are "NO", then may proceed with Cephalosporin use.   . Pregabalin Palpitations    Manic symptoms, no sleep x's 3 days, no appetite.  . Seroquel [Quetiapine Fumarate] Other (See Comments)    Irritable  . Sulfa Antibiotics Rash  . Tetanus Toxoids Other (See Comments)    Swelling to injection site  . Tramadol Nausea And Vomiting  . Ziprasidone Hcl Other (See Comments)    Heat/cold intolerance.     ROS Review of Systems  Constitutional: Negative.   HENT: Negative.   Respiratory: Negative.    Cardiovascular: Negative.   Neurological: Negative.   Psychiatric/Behavioral: Negative.      OBJECTIVE:    Physical Exam Vitals and nursing note reviewed.  Constitutional:  Appearance: Normal appearance.  Cardiovascular:     Rate and Rhythm: Normal rate and regular rhythm.  Neurological:     General: No focal deficit present.     Mental Status: She is alert and oriented to person, place, and time.  Psychiatric:        Mood and Affect: Mood normal.     BP 132/72   Pulse 93   Ht 5\' 9"  (1.753 m)   Wt 155 lb 9.6 oz (70.6 kg)   BMI 22.98 kg/m  Wt Readings from Last 3 Encounters:  11/14/20 155 lb 9.6 oz (70.6 kg)  11/09/20 149 lb 14.6 oz (68 kg)  10/25/20 150 lb (68 kg)    Health Maintenance Due  Topic Date Due  . Hepatitis C Screening  Never done  . COLONOSCOPY (Pts 45-59yrs Insurance coverage will need to be confirmed)  Never done  . Zoster Vaccines- Shingrix (1 of 2) Never done    There are no preventive care reminders to display for this patient.  CBC Latest Ref Rng & Units 11/09/2020 08/03/2020 07/29/2020  WBC 4.0 - 10.5 K/uL 7.3 8.4 7.9  Hemoglobin 12.0 - 15.0 g/dL 13.2 14.2 13.9  Hematocrit 36.0 - 46.0 % 38.9 43.9 41.6  Platelets 150 - 400 K/uL 267 313 285   CMP Latest Ref Rng & Units 10/25/2020 08/03/2020 07/29/2020  Glucose 70 - 99 mg/dL 84 105(H) 91  BUN 6 - 20 mg/dL 12 10 10   Creatinine 0.44 - 1.00 mg/dL 0.97 1.03(H) 1.05(H)  Sodium 135 - 145 mmol/L 138 137 138  Potassium 3.5 - 5.1 mmol/L 3.9 4.0 4.3  Chloride 98 - 111 mmol/L 109 105 105  CO2 22 - 32 mmol/L 21(L) 23 27  Calcium 8.9 - 10.3 mg/dL 8.2(L) 8.6(L) 8.6(L)  Total Protein 6.1 - 8.1 g/dL - - -  Total Bilirubin 0.2 - 1.2 mg/dL - - -  Alkaline Phos 38 - 126 U/L - - -  AST 10 - 35 U/L - - -  ALT 6 - 29 U/L - - -    Lab Results  Component Value Date   TSH 0.41 04/28/2020   Lab Results  Component Value Date   ALBUMIN 2.7 (L) 03/07/2020   ANIONGAP 8 10/25/2020   GFR 68.49 05/09/2017   Lab  Results  Component Value Date   CHOL 245 (H) 06/24/2016   CHOL 245 (H) 02/26/2013   CHOL 203 (H) 02/04/2012   HDL 40.20 06/24/2016   HDL 34.20 (L) 02/26/2013   HDL 29 (L) 02/04/2012   LDLCALC 114 (H) 02/04/2012   CHOLHDL 6 06/24/2016   CHOLHDL 7 02/26/2013   Lab Results  Component Value Date   TRIG 330 (H) 03/03/2020   Lab Results  Component Value Date   HGBA1C 5.2 04/28/2020      ASSESSMENT & PLAN:   Problem List Items Addressed This Visit      Nervous and Auditory   Radiculopathy affecting upper extremity - Primary    For 3 months patient has had worsening r arm Radiculopathy that she has been treated by [facility] Neurosurgery and received 2  Injections, after the 2 nd injection her symptoms had worsened.  She is requesting a second opinion on options including surgery options.       Relevant Medications   baclofen (LIORESAL) 10 MG tablet   Other Relevant Orders   Ambulatory referral to Neurosurgery      Meds ordered this encounter  Medications  . baclofen (LIORESAL) 10 MG tablet  Sig: Take 1 tablet (10 mg total) by mouth 3 (three) times daily.    Dispense:  30 each    Refill:  0      Follow-up: No follow-ups on file.    Beckie Salts, [place] [address] [place], [place] [address]

## 2020-12-10 ENCOUNTER — Encounter: Payer: Self-pay | Admitting: Internal Medicine

## 2020-12-10 ENCOUNTER — Other Ambulatory Visit: Payer: Self-pay

## 2020-12-10 ENCOUNTER — Ambulatory Visit (INDEPENDENT_AMBULATORY_CARE_PROVIDER_SITE_OTHER): Payer: Medicare HMO | Admitting: Internal Medicine

## 2020-12-10 VITALS — BP 123/70 | HR 68 | Ht 69.0 in | Wt 157.4 lb

## 2020-12-10 DIAGNOSIS — G90A Postural orthostatic tachycardia syndrome (POTS): Secondary | ICD-10-CM

## 2020-12-10 DIAGNOSIS — E785 Hyperlipidemia, unspecified: Secondary | ICD-10-CM | POA: Diagnosis not present

## 2020-12-10 DIAGNOSIS — M797 Fibromyalgia: Secondary | ICD-10-CM

## 2020-12-10 DIAGNOSIS — M5410 Radiculopathy, site unspecified: Secondary | ICD-10-CM | POA: Diagnosis not present

## 2020-12-10 DIAGNOSIS — I10 Essential (primary) hypertension: Secondary | ICD-10-CM

## 2020-12-10 DIAGNOSIS — I498 Other specified cardiac arrhythmias: Secondary | ICD-10-CM | POA: Diagnosis not present

## 2020-12-10 DIAGNOSIS — K580 Irritable bowel syndrome with diarrhea: Secondary | ICD-10-CM

## 2020-12-14 ENCOUNTER — Encounter: Payer: Self-pay | Admitting: Internal Medicine

## 2020-12-14 NOTE — Assessment & Plan Note (Signed)
Stable at the present time. 

## 2020-12-14 NOTE — Assessment & Plan Note (Signed)
Chronic problem. 

## 2020-12-14 NOTE — Assessment & Plan Note (Signed)
Patient will be referred to neurologist for further evaluation of the right arm pain and follow-up on cervical spine disease

## 2020-12-14 NOTE — Assessment & Plan Note (Signed)

## 2020-12-14 NOTE — Progress Notes (Signed)
Established Patient Office Visit  Subjective:  Patient ID: Melissa [name], female    DOB: 1970-08-21  Age: 50 y.o. MRN: [id number]  CC:  Chief Complaint  Patient presents with   Follow-up    HPI  Melissa [name] presents for weakness of the right arm, she has cervical spine disease as well as orthopedic surgeon told her that she may have a carpal tunnel syndrome.  I am planning to refer her to a neurosurgeon or neurologist for further opinion.  MRI was reviewed with the patient.   also has a history of fibromyalgia fatigue mild depression gastroesophageal reflux disease dyslipidemia as well as irritable bowel syndrome.  She has a history of right scapular pain gabapentin does not give her much relief.  EMG has been done by the neurologist recently about 3 months ago there is no evidence of cervical radiculopathy.  Mild carpal tunnel is noted.      Past Medical History:  Diagnosis Date   Arthritis    Bipolar disorder ([place])    Fibromyalgia    on disability   Migraine    Muscle spasm    Pain of right side of body    chronic   Panic attack    POTS (postural orthostatic tachycardia syndrome)    Syncope    recurrent   Uterine perforation    hx of    Past Surgical History:  Procedure Laterality Date   CATARACT EXTRACTION  dec 2014   DILATION AND CURETTAGE OF UTERUS     LASIK  dec 2014    Family History  Problem Relation Age of Onset   Diabetes Mother    Hypertension Mother    Brain cancer Father    Ovarian cancer Maternal Grandmother        metastasized   Lung cancer Maternal Grandfather    Cancer Paternal Grandmother        not sure of the type   Cancer Paternal Grandfather        not sure of the type   Migraines Neg Hx     Social History   Socioeconomic History   Marital status: Married    Spouse name: Izell Crystal   Number of children: 0   Years of education: some college   Highest education level: Not on file  Occupational History    Employer: OTHER     Comment: disabled  Tobacco Use   Smoking status: Never   Smokeless tobacco: Never  Vaping Use   Vaping Use: Never used  Substance and Sexual Activity   Alcohol use: No    Alcohol/week: 0.0 standard drinks   Drug use: No   Sexual activity: Yes    Birth control/protection: Surgical    Comment: also on OCPs  Other Topics Concern   Not on file  Social History Narrative   08/28/19   From: [place], moved because her husband was from [place]   Living: with husband Izell Pennington, since 1990   Work: disability due to fibromyalgia and bipolar disorder      Family: mother in law is nearby, her mother is in [place]; has a sister      Enjoys: crochet, baking, reading      Exercise: walking the dog, yoga - tries to do 3 times a week   Diet: not great, more sweets than she should      Safety   Seat belts: Yes    Guns: Yes  and secure   Safe in relationships: Yes  Right handed   Caffeine: none    Social Determinants of Health   Financial Resource Strain: Not on file  Food Insecurity: Not on file  Transportation Needs: Not on file  Physical Activity: Not on file  Stress: Not on file  Social Connections: Not on file  Intimate Partner Violence: Not on file     Current Outpatient Medications:    acetaminophen (TYLENOL) 500 MG tablet, Take 1,000 mg by mouth daily as needed for mild pain., Disp: , Rfl:    baclofen (LIORESAL) 10 MG tablet, Take 1 tablet (10 mg total) by mouth 3 (three) times daily., Disp: 30 each, Rfl: 0   buPROPion (WELLBUTRIN XL) 150 MG 24 hr tablet, Take 1 tablet (150 mg total) by mouth daily., Disp: 90 tablet, Rfl: 3   cholecalciferol (VITAMIN D) 1000 units tablet, Take 5,000 Units by mouth daily., Disp: , Rfl:    clonazePAM (KLONOPIN) 1 MG tablet, Take 1 tablet (1 mg total) by mouth 2 (two) times daily as needed. for anxiety, Disp: 60 tablet, Rfl: 0   cyclobenzaprine (FLEXERIL) 10 MG tablet, TAKE 1 TABLET BY MOUTH 3 TIMES A DAY AS NEEDED FOR MUSCLE SPASM, Disp: 90  tablet, Rfl: 0   fluvoxaMINE (LUVOX) 50 MG tablet, Take 50-100 mg by mouth at bedtime. , Disp: , Rfl:    Fremanezumab-vfrm (AJOVY) 225 MG/1.5ML SOAJ, Inject 225 mg into the skin every 30 (thirty) days., Disp: 9 mL, Rfl: 0   gabapentin (NEURONTIN) 600 MG tablet, TAKE 1 TABLET BY MOUTH 3 TIMES DAILY, Disp: 90 tablet, Rfl: 3   Galcanezumab-gnlm (EMGALITY) 120 MG/ML SOAJ, Inject 120 mg into the skin every 30 (thirty) days., Disp: 1 mL, Rfl: 11   lidocaine (LIDODERM) 5 %, Place 1 patch onto the skin every 12 (twelve) hours. Remove & Discard patch within 12 hours or as directed by MD, Disp: 10 patch, Rfl: 0   norgestimate-ethinyl estradiol (SPRINTEC 28) 0.25-35 MG-MCG tablet, Take 1 tablet by mouth daily., Disp: 6 Package, Rfl: 1   rizatriptan (MAXALT-MLT) 10 MG disintegrating tablet, Take 1 tablet (10 mg total) by mouth as needed for migraine. May repeat in 2 hours if needed, Disp: 9 tablet, Rfl: 11   verapamil (CALAN-SR) 120 MG CR tablet, TAKE ONE TABLET BY MOUTH AT BEDTIME, Disp: 90 tablet, Rfl: 3   vitamin B-12 (CYANOCOBALAMIN) 1000 MCG tablet, Take 1,000 mcg by mouth daily., Disp: , Rfl:    Allergies  Allergen Reactions   Lamotrigine Hives and Swelling   Abilify [Aripiprazole] Other (See Comments)    Weight gain, did not help,    Azithromycin Rash   Depakote [Divalproex Sodium] Other (See Comments)    Hair loss   Erythromycin Base Swelling   Lithium Rash   Penicillins Rash    Has patient had a PCN reaction causing immediate rash, facial/tongue/throat swelling, SOB or lightheadedness with hypotension: Yes- swelling, rash  Has patient had a PCN reaction causing severe rash involving mucus membranes or skin necrosis: Yes Has patient had a PCN reaction that required hospitalization No Has patient had a PCN reaction occurring within the last 10 years: No If all of the above answers are "NO", then may proceed with Cephalosporin use.    Pregabalin Palpitations    Manic symptoms, no sleep x's 3  days, no appetite.   Seroquel [Quetiapine Fumarate] Other (See Comments)    Irritable   Sulfa Antibiotics Rash   Tetanus Toxoids Other (See Comments)    Swelling to injection site   Tramadol  Nausea And Vomiting   Ziprasidone Hcl Other (See Comments)    Heat/cold intolerance.     ROS Review of Systems  Constitutional: Negative.   HENT: Negative.    Eyes: Negative.   Respiratory: Negative.    Cardiovascular: Negative.   Gastrointestinal: Negative.   Endocrine: Negative.   Genitourinary: Negative.   Musculoskeletal: Negative.   Skin: Negative.   Allergic/Immunologic: Negative.   Neurological:  Positive for weakness (Weakness of the right arm) and numbness. Negative for syncope, facial asymmetry, speech difficulty, light-headedness and headaches.  Hematological: Negative.   Psychiatric/Behavioral: Negative.    All other systems reviewed and are negative.    Objective:    Physical Exam Vitals reviewed.  Constitutional:      Appearance: Normal appearance.  HENT:     Mouth/Throat:     Mouth: Mucous membranes are moist.  Eyes:     Pupils: Pupils are equal, round, and reactive to light.  Neck:     Vascular: No carotid bruit.  Cardiovascular:     Rate and Rhythm: Normal rate and regular rhythm.     Pulses: Normal pulses.     Heart sounds: Normal heart sounds.  Pulmonary:     Effort: Pulmonary effort is normal.     Breath sounds: Normal breath sounds.  Abdominal:     General: Bowel sounds are normal.     Palpations: Abdomen is soft. There is no hepatomegaly, splenomegaly or mass.     Tenderness: There is no abdominal tenderness.     Hernia: No hernia is present.  Musculoskeletal:        General: No tenderness.     Cervical back: Neck supple.     Right lower leg: No edema.     Left lower leg: No edema.  Skin:    Findings: No rash.  Neurological:     Mental Status: She is alert and oriented to person, place, and time.     Motor: No weakness.  Psychiatric:         Mood and Affect: Mood and affect normal.        Behavior: Behavior normal.    BP 123/70   Pulse 68   Ht 5\' 9"  (1.753 m)   Wt 157 lb 6.4 oz (71.4 kg)   BMI 23.24 kg/m  Wt Readings from Last 3 Encounters:  12/10/20 157 lb 6.4 oz (71.4 kg)  11/14/20 155 lb 9.6 oz (70.6 kg)  11/09/20 149 lb 14.6 oz (68 kg)     Health Maintenance Due  Topic Date Due   Hepatitis C Screening  Never done   COLONOSCOPY (Pts 45-80yrs Insurance coverage will need to be confirmed)  Never done   Zoster Vaccines- Shingrix (1 of 2) Never done    There are no preventive care reminders to display for this patient.  Lab Results  Component Value Date   TSH 0.41 04/28/2020   Lab Results  Component Value Date   WBC 7.3 11/09/2020   HGB 13.2 11/09/2020   HCT 38.9 11/09/2020   MCV 87.6 11/09/2020   PLT 267 11/09/2020   Lab Results  Component Value Date   NA 138 10/25/2020   K 3.9 10/25/2020   CO2 21 (L) 10/25/2020   GLUCOSE 84 10/25/2020   BUN 12 10/25/2020   CREATININE 0.97 10/25/2020   BILITOT 0.4 04/28/2020   ALKPHOS 68 03/07/2020   AST 14 04/28/2020   ALT 6 04/28/2020   PROT 6.6 04/28/2020   ALBUMIN 2.7 (L) 03/07/2020  CALCIUM 8.2 (L) 10/25/2020   ANIONGAP 8 10/25/2020   GFR 68.49 05/09/2017   Lab Results  Component Value Date   CHOL 245 (H) 06/24/2016   Lab Results  Component Value Date   HDL 40.20 06/24/2016   Lab Results  Component Value Date   LDLCALC 114 (H) 02/04/2012   Lab Results  Component Value Date   TRIG 330 (H) 03/03/2020   Lab Results  Component Value Date   CHOLHDL 6 06/24/2016   Lab Results  Component Value Date   HGBA1C 5.2 04/28/2020      Assessment & Plan:   Problem List Items Addressed This Visit       Cardiovascular and Mediastinum   POTS (postural orthostatic tachycardia syndrome)    Stable at the present time.       Essential hypertension     Patient denies any chest pain or shortness of breath there is no history of palpitation or  paroxysmal nocturnal dyspnea   patient was advised to follow low-salt low-cholesterol diet    ideally I want to keep systolic blood pressure below 130 mmHg, patient was asked to check blood pressure one times a week and give me a report on that.  Patient will be follow-up in 3 months  or earlier as needed, patient will call me back for any change in the cardiovascular symptoms Patient was advised to buy a book from local bookstore concerning blood pressure and read several chapters  every day.  This will be supplemented by some of the material we will give him from the office.  Patient should also utilize other resources like YouTube and Internet to learn more about the blood pressure and the diet.         Digestive   IBS (irritable bowel syndrome)    Stable at the present time.         Nervous and Auditory   Radiculopathy affecting upper extremity    Patient will be referred to neurologist for further evaluation of the right arm pain and follow-up on cervical spine disease         Other   Fibromyalgia    Chronic problem       Dyslipidemia    Hypercholesterolemia  I advised the patient to follow Mediterranean diet This diet is rich in fruits vegetables and whole grain, and This diet is also rich in fish and lean meat Patient should also eat a handful of almonds or walnuts daily Recent heart study indicated that average follow-up on this kind of diet reduces the cardiovascular mortality by 50 to 70%==       Other Visit Diagnoses     Radiculopathy, unspecified spinal region    -  Primary   Relevant Orders   Ambulatory referral to Neurology       No orders of the defined types were placed in this encounter.   Follow-up: No follow-ups on file.    Cletis Athens, MD

## 2020-12-14 NOTE — Assessment & Plan Note (Signed)
Hypercholesterolemia  I advised the patient to follow Mediterranean diet This diet is rich in fruits vegetables and whole grain, and This diet is also rich in fish and lean meat Patient should also eat a handful of almonds or walnuts daily Recent heart study indicated that average follow-up on this kind of diet reduces the cardiovascular mortality by 50 to 70%== 

## 2020-12-16 ENCOUNTER — Other Ambulatory Visit: Payer: Self-pay | Admitting: *Deleted

## 2020-12-16 DIAGNOSIS — M542 Cervicalgia: Secondary | ICD-10-CM

## 2021-01-13 DIAGNOSIS — H43392 Other vitreous opacities, left eye: Secondary | ICD-10-CM | POA: Diagnosis not present

## 2021-01-13 DIAGNOSIS — H40023 Open angle with borderline findings, high risk, bilateral: Secondary | ICD-10-CM | POA: Diagnosis not present

## 2021-01-13 DIAGNOSIS — Z961 Presence of intraocular lens: Secondary | ICD-10-CM | POA: Diagnosis not present

## 2021-01-19 ENCOUNTER — Other Ambulatory Visit: Payer: Self-pay | Admitting: Family Medicine

## 2021-01-19 ENCOUNTER — Other Ambulatory Visit: Payer: Self-pay | Admitting: Internal Medicine

## 2021-01-26 DIAGNOSIS — F3162 Bipolar disorder, current episode mixed, moderate: Secondary | ICD-10-CM | POA: Diagnosis not present

## 2021-01-26 DIAGNOSIS — F411 Generalized anxiety disorder: Secondary | ICD-10-CM | POA: Diagnosis not present

## 2021-01-27 ENCOUNTER — Ambulatory Visit: Payer: Medicare HMO | Admitting: Internal Medicine

## 2021-02-10 ENCOUNTER — Ambulatory Visit: Payer: Medicare HMO | Admitting: Internal Medicine

## 2021-02-10 ENCOUNTER — Telehealth: Payer: Self-pay | Admitting: *Deleted

## 2021-02-10 NOTE — Telephone Encounter (Signed)
-----   Message from Melvenia Beam, MD sent at 02/02/2021  9:47 AM EDT ----- Regarding: FW: sHE IS COMING IN 2 WEEKS, GATHER SAMPLES I have been giving patient samples. Can you see if she is still on the Ajovy and if she is still having problems affordig it? Also ask if she has tried to get the ajovy through their foundation. If she has not, have her start the process and I will try and gather her some samples until she can get that done. Let me know what she says thanks    ----- Message ----- From: Melvenia Beam, MD Sent: 02/02/2021  12:00 AM EDT To: Melvenia Beam, MD Subject: sHE IS COMING IN 2 WEEKS, GATHER SAMPLES       We gave her samples, she could not afford CGRPs , emgality worked great, we gave her 6 months of Ajovy. She is having hair loss due to covid so did not give her aimovig. She will be in the office aug 29th, gather either ajovy or emgality for another 6 months for her.

## 2021-02-10 NOTE — Telephone Encounter (Signed)
LMVM for pt to return call about her migraine injectables.

## 2021-02-11 ENCOUNTER — Ambulatory Visit: Payer: Medicare HMO | Admitting: Internal Medicine

## 2021-02-11 NOTE — Telephone Encounter (Signed)
Mailed this note for pt since she has not responded.

## 2021-02-11 NOTE — Telephone Encounter (Signed)
LMVM for pt to return call (2nd call).

## 2021-02-12 NOTE — Telephone Encounter (Signed)
Pt has called Lovey Newcomer, RN back.  Pt will not be available for a call back today, she said she will call back on Monday since the office is not open on Fridays.

## 2021-02-12 NOTE — Telephone Encounter (Signed)
Noted  

## 2021-02-16 ENCOUNTER — Telehealth: Payer: Self-pay | Admitting: *Deleted

## 2021-02-16 ENCOUNTER — Ambulatory Visit: Payer: Medicare HMO | Admitting: Neurology

## 2021-02-16 ENCOUNTER — Encounter: Payer: Self-pay | Admitting: Neurology

## 2021-02-16 ENCOUNTER — Other Ambulatory Visit: Payer: Self-pay

## 2021-02-16 VITALS — BP 115/77 | HR 103 | Ht 69.0 in | Wt 157.0 lb

## 2021-02-16 DIAGNOSIS — G43709 Chronic migraine without aura, not intractable, without status migrainosus: Secondary | ICD-10-CM

## 2021-02-16 MED ORDER — AJOVY 225 MG/1.5ML ~~LOC~~ SOAJ: 225.0000 mg | SUBCUTANEOUS | 0 refills | Status: DC

## 2021-02-16 MED ORDER — ELETRIPTAN HYDROBROMIDE 40 MG PO TABS: 40.0000 mg | ORAL_TABLET | ORAL | 11 refills | Status: DC | PRN

## 2021-02-16 MED ORDER — ONDANSETRON 4 MG PO TBDP: 4.0000 mg | ORAL_TABLET | Freq: Three times a day (TID) | ORAL | 11 refills | Status: DC | PRN

## 2021-02-16 NOTE — Patient Instructions (Signed)
Continue Ajovy At inset: Zofran, eletriptan: Please take one tablet at the onset of your headache. If it does not improve the symptoms please take one additional tablet. Do not take more then 2 tablets in 24hrs. Do not take use more then 2 to 3 times in a week. May also take muscle relaxer and ibuprofen/tylenol at onset  Eletriptan Tablets What is this medication? ELETRIPTAN (el ih TRIP tan) treats migraines. It works by blocking pain signals and narrowing blood vessels in the brain. It belongs to a group of medicationscalled triptans. It is not used to prevent migraines. This medicine may be used for other purposes; ask your health care provider orpharmacist if you have questions. COMMON BRAND NAME(S): Relpax What should I tell my care team before I take this medication? They need to know if you have any of these conditions: Cigarette smoker Circulation problems in fingers and toes Diabetes Heart disease High blood pressure High cholesterol History of irregular heartbeat History of stroke Kidney disease Liver disease Stomach or intestine problems An unusual or allergic reaction to eletriptan, other medications, foods, dyes, or preservatives Pregnant or trying to get pregnant Breast-feeding How should I use this medication? Take this medication by mouth with a glass of water. Follow the directions onthe prescription label. Do not take it more often than directed. Talk to your care team regarding the use of this medication in children.Special care may be needed. Overdosage: If you think you have taken too much of this medicine contact apoison control center or emergency room at once. NOTE: This medicine is only for you. Do not share this medicine with others. What if I miss a dose? This does not apply. This medication is not for regular use. What may interact with this medication? Do not take this medication with any of the following: Ceritinib Certain antibiotics like  clarithromycin or telithromycin Certain antivirals for HIV or hepatitis Certain medications for fungal infections like ketoconazole, itraconazole, or posaconazole Chloramphenicol Ergot alkaloids like dihydroergotamine, ergonovine, ergotamine, methylergonovine Idelalisib Mifepristone Nefazodone Other medications for migraine headache like almotriptan, frovatriptan, naratriptan, rizatriptan, sumatriptan, zolmitriptan Ribociclib This medication may also interact with the following: Certain medications for depression, anxiety, or psychotic disorders MAOIs like Carbex, Eldepryl, Marplan, Nardil, and Parnate This list may not describe all possible interactions. Give your health care provider a list of all the medicines, herbs, non-prescription drugs, or dietary supplements you use. Also tell them if you smoke, drink alcohol, or use illegaldrugs. Some items may interact with your medicine. What should I watch for while using this medication? Visit your care team for regular checks on your progress. Tell your care teamif your symptoms do not start to get better or if they get worse. You may get drowsy or dizzy. Do not drive, use machinery, or do anything that needs mental alertness until you know how this medication affects you. Do not stand up or sit up quickly, especially if you are an older patient. This reduces the risk of dizzy or fainting spells. Alcohol may interfere with theeffect of this medication. Your mouth may get dry. Chewing sugarless gum or sucking hard candy and drinking plenty of water may help. Contact your care team if the problem doesnot go away or is severe. If you take migraine medications for 10 or more days a month, your migraines may get worse. Keep a diary of headache days and medication use. Contact yourcare team if your migraine attacks occur more frequently. What side effects may I notice from receiving this  medication? Side effects that you should report to your care team  as soon as possible: Allergic reactions-skin rash, itching, hives, swelling of the face, lips, tongue, or throat Burning, pain, tingling, or color changes in the legs or feet Heart attack-pain or tightness in the chest, shoulders, arms, or jaw, nausea, shortness of breath, cold or clammy skin, feeling faint or lightheaded Heart rhythm changes-fast or irregular heartbeat, dizziness, feeling faint or lightheaded, chest pain, trouble breathing Increase in blood pressure Irritability, confusion, fast or irregular heartbeat, muscle stiffness, twitching muscles, sweating, high fever, seizure, chills, vomiting, diarrhea, which may be signs of serotonin syndrome Raynaud's-cool, numb, or painful fingers or toes that may change color from pale, to blue, to red Seizures Stroke-sudden numbness or weakness of the face, arm, or leg, trouble speaking, confusion, trouble walking, loss of balance or coordination, dizziness, severe headache, change in vision Sudden or severe stomach pain, nausea, vomiting, fever, or bloody diarrhea Vision loss Side effects that usually do not require medical attention (report to your careteam if they continue or are bothersome): Dizziness General discomfort or fatigue This list may not describe all possible side effects. Call your doctor for medical advice about side effects. You may report side effects to FDA at1-800-FDA-1088. Where should I keep my medication? Keep out of the reach of children and pets. Store at room temperature between 15 and 30 degrees C (59 and 86 degrees F).Throw away any unused medication after the expiration date. NOTE: This sheet is a summary. It may not cover all possible information. If you have questions about this medicine, talk to your doctor, pharmacist, orhealth care provider.  2022 Elsevier/Gold Standard (2020-07-01 16:32:43)

## 2021-02-16 NOTE — Telephone Encounter (Signed)
Marlies Demore Key: [id number] - PA Case ID: [id number] - Rx #[id number]  Sent PA Eletriptan Hydrobromide '40MG'$  tablets Waiting on approval

## 2021-02-16 NOTE — Telephone Encounter (Signed)
I spoke with the pt's pharmacy and was advised the prescription technically is written for 6 tablets per day. If the Rx can be changed insurance may cover. One of our staff members had already completed a PA today and is waiting to hear back from insurance.

## 2021-02-16 NOTE — Progress Notes (Signed)
[id number]    Provider:  Dr Jaynee Eagles Requesting Provider: Cletis Athens, MD Primary Care Provider:  Cletis Athens, MD  CC:  Migraine  02/16/2021: DOing great on Ajovy. Only 6 migraines in 6 months. Takes maxalt and baclofen acutely or flexeril, but when she gets a migraine it is hard to get rid of, the last one was 3 days, even though frequency is greatly improved acute management is still a problem. She did great on Emgality bu it is $85 and she can't afford it. She had covid in the meantime. We will give her 6 months of samples of Ajovy. She only had one migraine, we discussed aimovig but she is having hair loss due to covid so gave ajovy.     Acutely: Tried maxalt, zolmitriptan, ondansetron helps significantly for the nausea, sumatriptan,   HPI:  Melissa [name] is a 50 y.o. female here as requested by Cletis Athens, MD for migraines. PMHx POTS, panic attack, migraine, fibromyalgia, bipolar, arthritis, HTN, IBS, depression, B12 deficiency, tremor, anxiety. She is having stress with her mother who is 25 and refuses to leave her home, so there is stress affecting. Been doing fine, restarted 3-4 months ago in the setting of stress, unilateral, mostly sound sensitivity and mild light sensitivity, movement makes it worse, nausea, no vomiting, getting a migraine every week now, can last 2-3 days, up to 12 migraine days a month for the last 3 months and pulsating/pounding, starts in the left in the back and radiates to the front, can be severe, a quiet room helps, OTC has not helped, hydrocodone has not helped significantly, had to go to the walk-in clinic recently, can affect her life and work. These are similar to prior migraines, vision is affected with the severe migraines, no new sensory changes or new vision changes, same quality and severity just more frequent, they can happen any time of the day. Not positional or exertional or anything of concern ut is the same exact migraine as  always. No other focal neurologic deficits, associated symptoms, inciting events or modifiable factors.  Reviewed notes, labs and imaging from outside physicians, which showed:  Cbc/bmp unremarkable 06/26/2019  CT head 08/2018 showed No acute intracranial abnormalities including mass lesion or mass effect, hydrocephalus, extra-axial fluid collection, midline shift, hemorrhage, or acute infarction, large ischemic events (personally reviewed images)  Medications that patient has use that can be used in migraine management include: Tylenol, Elavil, Wellbutrin, Tegretol, Flexeril, diclofenac tablet, Benadryl, Depakote, Prozac, gabapentin, Toradol injections, meclizine, Robaxin, Reglan injections, Zofran injections and tablets, prednisone tablets, Phenergan tablets and suppositories, Maxalt, Imitrex tablets and injections, tizanidine, tramadol, Effexor, verapamil, topamax, propranolol contraindicated due to POTS.    Review of Systems: Patient complains of symptoms per HPI as well as the following symptoms: hnausea. Pertinent negatives and positives per HPI. All others negative    Social History   Socioeconomic History   Marital status: Married    Spouse name: Izell Redbird Smith   Number of children: 0   Years of education: some college   Highest education level: Not on file  Occupational History    Employer: OTHER    Comment: disabled  Tobacco Use   Smoking status: Never   Smokeless tobacco: Never  Vaping Use   Vaping Use: Never used  Substance and Sexual Activity   Alcohol use: No    Alcohol/week: 0.0 standard drinks   Drug use: No   Sexual activity: Yes    Birth control/protection: Surgical    Comment: also  on OCPs  Other Topics Concern   Not on file  Social History Narrative   08/28/19   From: [place], moved because her husband was from [place]   Living: with husband Izell [place], since 1990   Work: disability due to fibromyalgia and bipolar disorder      Family: mother in law is nearby, her mother  is in [place]; has a sister      Enjoys: crochet, baking, reading      Exercise: walking the dog, yoga - tries to do 3 times a week   Diet: not great, more sweets than she should      Safety   Seat belts: Yes    Guns: Yes  and secure   Safe in relationships: Yes          Right handed   Caffeine: none    Social Determinants of Health   Financial Resource Strain: Not on file  Food Insecurity: Not on file  Transportation Needs: Not on file  Physical Activity: Not on file  Stress: Not on file  Social Connections: Not on file  Intimate Partner Violence: Not on file    Family History  Problem Relation Age of Onset   Diabetes Mother    Hypertension Mother    Brain cancer Father    Ovarian cancer Maternal Grandmother        metastasized   Lung cancer Maternal Grandfather    Cancer Paternal Grandmother        not sure of the type   Cancer Paternal Grandfather        not sure of the type   Migraines Neg Hx     Past Medical History:  Diagnosis Date   Arthritis    Bipolar disorder ([place])    Fibromyalgia    on disability   Migraine    Muscle spasm    Pain of right side of body    chronic   Panic attack    POTS (postural orthostatic tachycardia syndrome)    Syncope    recurrent   Uterine perforation    hx of    Patient Active Problem List   Diagnosis Date Noted   Radiculopathy affecting upper extremity 11/14/2020   Thrombocytosis 03/09/2020   Hyperglycemia 03/09/2020   Pneumonia due to COVID-19 virus 03/03/2020   Depression with anxiety 03/03/2020   Sepsis ([place]) 03/03/2020   Acute respiratory failure with hypoxia ([facility])    Chronic migraine without aura without status migrainosus, not intractable 02/11/2020   Anxiety 10/23/2019   Dizziness 07/02/2019   Tremor of both hands 03/27/2018   Syncopal episodes 09/10/2017   Essential hypertension 05/09/2017   Menorrhagia 11/11/2016   Alopecia 03/02/2016   Bilateral cataracts 08/14/2015   Acne 10/21/2014   Liver  lesion 09/09/2014   Vitamin D deficiency 09/09/2014   Hot flashes 06/10/2014   Chronic pain syndrome 05/20/2014   Vitamin B12 deficiency 03/12/2014   Contraception management 09/20/2013   IBS (irritable bowel syndrome) 09/20/2013   Unspecified vitamin D deficiency 04/03/2013   Dyslipidemia 04/03/2013   Osteopenia 04/03/2013   POTS (postural orthostatic tachycardia syndrome) 02/26/2013   Migraine 11/29/2012   Allergic rhinitis 10/05/2012   Mixed bipolar I disorder in remission ([place]) 10/05/2012   Combined fat and carbohydrate induced hyperlipemia 10/05/2012   Fibromyalgia    Bipolar disorder ([place])    Fatigue 09/10/2011   Clinical depression 09/10/2011   Gastroesophageal reflux disease without esophagitis 09/10/2011   Non-restorative sleep 09/10/2011   Disturbance in sleep behavior 09/10/2011  Past Surgical History:  Procedure Laterality Date   CATARACT EXTRACTION  dec 2014   DILATION AND CURETTAGE OF UTERUS     LASIK  dec 2014    Current Outpatient Medications  Medication Sig Dispense Refill   acetaminophen (TYLENOL) 500 MG tablet Take 1,000 mg by mouth daily as needed for mild pain.     baclofen (LIORESAL) 10 MG tablet Take 1 tablet (10 mg total) by mouth 3 (three) times daily. 30 each 0   buPROPion (WELLBUTRIN XL) 150 MG 24 hr tablet Take 1 tablet (150 mg total) by mouth daily. 90 tablet 3   cholecalciferol (VITAMIN D) 1000 units tablet Take 5,000 Units by mouth daily.     clonazePAM (KLONOPIN) 1 MG tablet Take 1 tablet (1 mg total) by mouth 2 (two) times daily as needed. for anxiety 60 tablet 0   cyclobenzaprine (FLEXERIL) 10 MG tablet TAKE 1 TABLET BY MOUTH 3 TIMES A DAY AS NEEDED FOR MUSCLE SPASM 90 tablet 0   eletriptan (RELPAX) 40 MG tablet Take 1 tablet (40 mg total) by mouth as needed for migraine or headache. May repeat in 2 hours if headache persists or recurs. 9 tablet 11   fluvoxaMINE (LUVOX) 50 MG tablet Take 50-100 mg by mouth at bedtime.      Fremanezumab-vfrm  (AJOVY) 225 MG/1.5ML SOAJ Inject 225 mg into the skin every 30 (thirty) days. 9 mL 0   gabapentin (NEURONTIN) 600 MG tablet TAKE 1 TABLET BY MOUTH 3 TIMES DAILY 90 tablet 3   norgestimate-ethinyl estradiol (SPRINTEC 28) 0.25-35 MG-MCG tablet Take 1 tablet by mouth daily. 6 Package 1   ondansetron (ZOFRAN-ODT) 4 MG disintegrating tablet Take 1-2 tablets (4-8 mg total) by mouth every 8 (eight) hours as needed. 30 tablet 11   verapamil (CALAN-SR) 120 MG CR tablet TAKE ONE TABLET BY MOUTH AT BEDTIME 90 tablet 3   vitamin B-12 (CYANOCOBALAMIN) 1000 MCG tablet Take 1,000 mcg by mouth daily.     No current facility-administered medications for this visit.    Allergies as of 02/16/2021 - Review Complete 02/16/2021  Allergen Reaction Noted   Lamotrigine Hives and Swelling 06/03/2015   Abilify [aripiprazole] Other (See Comments) 07/17/2015   Azithromycin Rash 09/12/2012   Depakote [divalproex sodium] Other (See Comments) 11/28/2012   Erythromycin base Swelling 01/03/2015   Lithium Rash 11/28/2012   Penicillins Rash 09/12/2012   Pregabalin Palpitations 11/23/2013   Seroquel [quetiapine fumarate] Other (See Comments) 11/28/2012   Sulfa antibiotics Rash 09/12/2012   Tetanus toxoids Other (See Comments) 06/03/2015   Tramadol Nausea And Vomiting 03/12/2015   Ziprasidone hcl Other (See Comments) 01/03/2015    Vitals: BP 115/77   Pulse (!) 103   Ht '5\' 9"'$  (1.753 m)   Wt 157 lb (71.2 kg)   BMI 23.18 kg/m  Last Weight:  Wt Readings from Last 1 Encounters:  02/16/21 157 lb (71.2 kg)   Last Height:   Ht Readings from Last 1 Encounters:  02/16/21 '5\' 9"'$  (1.753 m)   Exam: NAD, pleasant                  Speech:    Speech is normal; fluent and spontaneous with normal comprehension.  Cognition:    The patient is oriented to person, place, and time;     recent and remote memory intact;     language fluent;    Cranial Nerves:    The pupils are equal, round, and reactive to light.Trigeminal  sensation is intact and the  muscles of mastication are normal. The face is symmetric. The palate elevates in the midline. Hearing intact. Voice is normal. Shoulder shrug is normal. The tongue has normal motion without fasciculations.   Coordination:  No dysmetria  Motor Observation:    No asymmetry, no atrophy, and no involuntary movements noted. Tone:    Normal muscle tone.     Strength:    Strength is V/V in the upper and lower limbs.      Sensation: intact to LT   Assessment/Plan:  50 year old with chronic migraines recently worsening in the setting of stress. She has tried and failed multiple medications, we discussed options.   Continue Ajovy At inset: Zofran, eletriptan: Please take one tablet at the onset of your headache. If it does not improve the symptoms please take one additional tablet. Do not take more then 2 tablets in 24hrs. Do not take use more then 2 to 3 times in a week. May also take muscle relaxer and ibuprofen/tylenol at onset  She did great on Emgality but it is $85 and she can't afford it. She had covid in the meantime. We will give her 6 months of samples of Ajovy. She only had one migraine, we discussed aimovig but she is having hair loss due to covid so gave ajovy.  To prevent or relieve headaches, try the following: Cool Compress. Lie down and place a cool compress on your head.  Avoid headache triggers. If certain foods or odors seem to have triggered your migraines in the past, avoid them. A headache diary might help you identify triggers.  Include physical activity in your daily routine. Try a daily walk or other moderate aerobic exercise.  Manage stress. Find healthy ways to cope with the stressors, such as delegating tasks on your to-do list.  Practice relaxation techniques. Try deep breathing, yoga, massage and visualization.  Eat regularly. Eating regularly scheduled meals and maintaining a healthy diet might help prevent headaches. Also, drink plenty  of fluids.  Follow a regular sleep schedule. Sleep deprivation might contribute to headaches Consider biofeedback. With this mind-body technique, you learn to control certain bodily functions -- such as muscle tension, heart rate and blood pressure -- to prevent headaches or reduce headache pain.    Proceed to emergency room if you experience new or worsening symptoms or symptoms do not resolve, if you have new neurologic symptoms or if headache is severe, or for any concerning symptom.    Meds ordered this encounter  Medications   eletriptan (RELPAX) 40 MG tablet    Sig: Take 1 tablet (40 mg total) by mouth as needed for migraine or headache. May repeat in 2 hours if headache persists or recurs.    Dispense:  9 tablet    Refill:  11   ondansetron (ZOFRAN-ODT) 4 MG disintegrating tablet    Sig: Take 1-2 tablets (4-8 mg total) by mouth every 8 (eight) hours as needed.    Dispense:  30 tablet    Refill:  11   Fremanezumab-vfrm (AJOVY) 225 MG/1.5ML SOAJ    Sig: Inject 225 mg into the skin every 30 (thirty) days.    Dispense:  9 mL    Refill:  0    Patient has copay card; she can have medication regardless of insurance approval or copay amount.     Cc: Cletis Athens, MD,  Cletis Athens, MD   I spent 20 minutes of face-to-face and non-face-to-face time with patient on the  1. Chronic migraine without  aura without status migrainosus, not intractable    diagnosis.  This included previsit chart review, lab review, study review, order entry, electronic health record documentation, patient education on the different diagnostic and therapeutic options, counseling and coordination of care, risks and benefits of management, compliance, or risk factor reduction    Sarina Ill, MD  [facility] [address] [place], [place] [address]  Phone [phone] Fax [phone]

## 2021-02-16 NOTE — Telephone Encounter (Signed)
Maryjane Schwebach Key: [id number] - PA Case ID: [id number] - Rx #[id number]  Sent PA for Ondansetron '4mg'$  waiting for approval

## 2021-02-17 ENCOUNTER — Other Ambulatory Visit: Payer: Self-pay | Admitting: Neurology

## 2021-02-17 MED ORDER — ONDANSETRON 8 MG PO TBDP: 4.0000 mg | ORAL_TABLET | Freq: Three times a day (TID) | ORAL | 0 refills | Status: DC | PRN

## 2021-02-17 MED ORDER — NARATRIPTAN HCL 2.5 MG PO TABS: 2.5000 mg | ORAL_TABLET | ORAL | 11 refills | Status: DC | PRN

## 2021-02-17 NOTE — Addendum Note (Signed)
Addended by: Gildardo Griffes on: 02/17/2021 10:59 AM   Modules accepted: Orders

## 2021-02-17 NOTE — Telephone Encounter (Signed)
Thanks, I am addressing the prescription with Dr Jaynee Eagles to see if the adjustment will fit insurance requirements.

## 2021-02-17 NOTE — Telephone Encounter (Signed)
Insurance denied this medication.   You asked for the drug above for your general nausea/vomiting and other nausea associated with migraine headaches. This is an off-label use that is not medically accepted. The Medicare rule in the Prescription Drug Benefit Manual (Chapter 6, Section 10.6) says a drug must be used for a medically accepted indication (covered use). Off-label use is medically accepted when there is proof in one or more of the drug guides that the drug works for your condition. We look at the two major drug guides (compendia): the [place] and the [place] (AHFS-DI). [employer] has decided that there is no proof in either drug guide that this drug works for your condition. Per Medicare rules, it is not covered.

## 2021-02-17 NOTE — Telephone Encounter (Signed)
Insurance has denied this medication.   The drug you asked for is not listed in your preferred drug list (formulary). The preferred drug(s), you may not have tried are: naratriptan tablet. Your provider needs to give [place] medical reasons why the preferred drug(s) would not work for you and/or would have bad side effects. Sometimes a preferred drug needs more review for approval. Additionally, some preferred drugs listed may be the same drugs with different strengths or forms. [employer] may only require one strength or form of that drug to be tried. This decision was from [employer] and [employer] Non-Formulary Exceptions Coverage Policy.

## 2021-02-21 ENCOUNTER — Emergency Department
Admission: EM | Admit: 2021-02-21 | Discharge: 2021-02-21 | Disposition: A | Discharge status: Home / Self Care | Payer: Medicare HMO | Attending: Emergency Medicine | Admitting: Emergency Medicine

## 2021-02-21 DIAGNOSIS — Z8616 Personal history of COVID-19: Secondary | ICD-10-CM | POA: Diagnosis not present

## 2021-02-21 DIAGNOSIS — M791 Myalgia, unspecified site: Secondary | ICD-10-CM | POA: Diagnosis not present

## 2021-02-21 DIAGNOSIS — Z79899 Other long term (current) drug therapy: Secondary | ICD-10-CM | POA: Diagnosis not present

## 2021-02-21 DIAGNOSIS — I10 Essential (primary) hypertension: Secondary | ICD-10-CM | POA: Diagnosis not present

## 2021-02-21 DIAGNOSIS — Z20822 Contact with and (suspected) exposure to covid-19: Secondary | ICD-10-CM | POA: Diagnosis not present

## 2021-02-21 LAB — RESP PANEL BY RT-PCR (FLU A&B, COVID) ARPGX2
Influenza A by PCR: NEGATIVE
Influenza B by PCR: NEGATIVE
SARS Coronavirus 2 by RT PCR: NEGATIVE

## 2021-02-21 LAB — CBC WITH DIFFERENTIAL/PLATELET
Abs Immature Granulocytes: 0.03 10*3/uL (ref 0.00–0.07)
Basophils Absolute: 0.1 10*3/uL (ref 0.0–0.1)
Basophils Relative: 1 %
Eosinophils Absolute: 0.2 10*3/uL (ref 0.0–0.5)
Eosinophils Relative: 2 %
HCT: 41 % (ref 36.0–46.0)
Hemoglobin: 14 g/dL (ref 12.0–15.0)
Immature Granulocytes: 0 %
Lymphocytes Relative: 33 %
Lymphs Abs: 2.5 10*3/uL (ref 0.7–4.0)
MCH: 29.7 pg (ref 26.0–34.0)
MCHC: 34.1 g/dL (ref 30.0–36.0)
MCV: 87 fL (ref 80.0–100.0)
Monocytes Absolute: 0.5 10*3/uL (ref 0.1–1.0)
Monocytes Relative: 7 %
Neutro Abs: 4.2 10*3/uL (ref 1.7–7.7)
Neutrophils Relative %: 57 %
Platelets: 273 10*3/uL (ref 150–400)
RBC: 4.71 MIL/uL (ref 3.87–5.11)
RDW: 13.3 % (ref 11.5–15.5)
WBC: 7.4 10*3/uL (ref 4.0–10.5)
nRBC: 0 % (ref 0.0–0.2)

## 2021-02-21 LAB — URINALYSIS, COMPLETE (UACMP) WITH MICROSCOPIC
Bacteria, UA: NONE SEEN
Bilirubin Urine: NEGATIVE
Glucose, UA: NEGATIVE mg/dL
Ketones, ur: NEGATIVE mg/dL
Nitrite: NEGATIVE
Protein, ur: NEGATIVE mg/dL
Specific Gravity, Urine: 1.025 (ref 1.005–1.030)
pH: 5.5 (ref 5.0–8.0)

## 2021-02-21 LAB — COMPREHENSIVE METABOLIC PANEL
ALT: 12 U/L (ref 0–44)
AST: 19 U/L (ref 15–41)
Albumin: 3.7 g/dL (ref 3.5–5.0)
Alkaline Phosphatase: 44 U/L (ref 38–126)
Anion gap: 7 (ref 5–15)
BUN: 13 mg/dL (ref 6–20)
CO2: 24 mmol/L (ref 22–32)
Calcium: 8.4 mg/dL — ABNORMAL LOW (ref 8.9–10.3)
Chloride: 106 mmol/L (ref 98–111)
Creatinine, Ser: 1 mg/dL (ref 0.44–1.00)
GFR, Estimated: >60 mL/min (ref >60)
Glucose, Bld: 93 mg/dL (ref 70–99)
Potassium: 4 mmol/L (ref 3.5–5.1)
Sodium: 137 mmol/L (ref 135–145)
Total Bilirubin: 0.5 mg/dL (ref 0.3–1.2)
Total Protein: 7 g/dL (ref 6.5–8.1)

## 2021-02-21 LAB — SEDIMENTATION RATE: Sed Rate: 9 mm/hr (ref 0–30)

## 2021-02-21 LAB — CK: Total CK: 53 U/L (ref 38–234)

## 2021-02-21 MED ORDER — HYDROCODONE-ACETAMINOPHEN 5-325 MG PO TABS
2.0000 | ORAL_TABLET | Freq: Once | ORAL | Status: AC
Start: 2021-02-21 — End: 2021-02-21
  Administered 2021-02-21: 2 via ORAL
  Filled 2021-02-21: qty 2

## 2021-02-21 MED ORDER — SODIUM CHLORIDE 0.9 % IV BOLUS
1000.0000 mL | Freq: Once | INTRAVENOUS | Status: AC
  Administered 2021-02-21: 1000 mL via INTRAVENOUS

## 2021-02-21 NOTE — ED Provider Notes (Signed)
[facility] Emergency Department Provider Note   ____________________________________________   Event Date/Time   First MD Initiated Contact with Patient 02/21/21 0404     (approximate)  I have reviewed the triage vital signs and the nursing notes.   HISTORY  Chief Complaint Pain    HPI Melissa [name] is a 50 y.o. female history of POTS, chronic migraines bipolar disorder fibromyalgia  Patient reports that for about 3 to 4 days now she been experiencing a flareup of pain.  She reports that somewhat hard to describe but describes like it almost feeling of a "bone pain" feeling that runs through her shoulders upper back into her arms and sometimes down into her legs.  She had areas across her shoulders that will flareup when her fibromyalgia flares up.  She denies having a headache and reports her migraines are well controlled.  She is receiving AJovy injections for the last 6 months and had one on Monday. (Of note, patient and I reviewed reported side effects from this injection, and I do not see a particular association with these type of symptoms)  No fevers or chills.  No chest pain denies any rash.  No insect bites.  No headache no neck pain.  No pain or burning with urination.  Denies pregnancy, currently takes birth control.  No chest pains or shortness of breath.  No Agbata nausea vomiting.  Reports slight decrease in appetite not eating as much last couple days due to just being in generalized pain and achiness.  She is still able to ambulate.  Patient reports she does use muscle relaxant in the evening to help her sleep including baclofen, use lorazepam at home.  Has previously had hydrocodone reports that this is generally been a helpful pain medicine for her  Past Medical History:  Diagnosis Date   Arthritis    Bipolar disorder ([place])    Fibromyalgia    on disability   Migraine    Muscle spasm    Pain of right side of body    chronic   Panic  attack    POTS (postural orthostatic tachycardia syndrome)    Syncope    recurrent   Uterine perforation    hx of    Patient Active Problem List   Diagnosis Date Noted   Radiculopathy affecting upper extremity 11/14/2020   Thrombocytosis 03/09/2020   Hyperglycemia 03/09/2020   Pneumonia due to COVID-19 virus 03/03/2020   Depression with anxiety 03/03/2020   Sepsis ([place]) 03/03/2020   Acute respiratory failure with hypoxia (HCC)    Chronic migraine without aura without status migrainosus, not intractable 02/11/2020   Anxiety 10/23/2019   Dizziness 07/02/2019   Tremor of both hands 03/27/2018   Syncopal episodes 09/10/2017   Essential hypertension 05/09/2017   Menorrhagia 11/11/2016   Alopecia 03/02/2016   Bilateral cataracts 08/14/2015   Acne 10/21/2014   Liver lesion 09/09/2014   Vitamin D deficiency 09/09/2014   Hot flashes 06/10/2014   Chronic pain syndrome 05/20/2014   Vitamin B12 deficiency 03/12/2014   Contraception management 09/20/2013   IBS (irritable bowel syndrome) 09/20/2013   Unspecified vitamin D deficiency 04/03/2013   Dyslipidemia 04/03/2013   Osteopenia 04/03/2013   POTS (postural orthostatic tachycardia syndrome) 02/26/2013   Migraine 11/29/2012   Allergic rhinitis 10/05/2012   Mixed bipolar I disorder in remission ([place]) 10/05/2012   Combined fat and carbohydrate induced hyperlipemia 10/05/2012   Fibromyalgia    Bipolar disorder ([place])    Fatigue 09/10/2011  Clinical depression 09/10/2011   Gastroesophageal reflux disease without esophagitis 09/10/2011   Non-restorative sleep 09/10/2011   Disturbance in sleep behavior 09/10/2011    Past Surgical History:  Procedure Laterality Date   CATARACT EXTRACTION  dec 2014   DILATION AND CURETTAGE OF UTERUS     LASIK  dec 2014    Prior to Admission medications   Medication Sig Start Date End Date Taking? Authorizing Provider  acetaminophen (TYLENOL) 500 MG tablet Take 1,000 mg by mouth daily as needed  for mild pain.    [provider]  baclofen (LIORESAL) 10 MG tablet Take 1 tablet (10 mg total) by mouth 3 (three) times daily. 11/14/20   Beckie Salts, FNP  buPROPion (WELLBUTRIN XL) 150 MG 24 hr tablet Take 1 tablet (150 mg total) by mouth daily. 07/17/19   Lucille Passy, MD  cholecalciferol (VITAMIN D) 1000 units tablet Take 5,000 Units by mouth daily.    [provider]  clonazePAM (KLONOPIN) 1 MG tablet Take 1 tablet (1 mg total) by mouth 2 (two) times daily as needed. for anxiety 05/26/16   Lucille Passy, MD  cyclobenzaprine (FLEXERIL) 10 MG tablet TAKE 1 TABLET BY MOUTH 3 TIMES A DAY AS NEEDED FOR MUSCLE SPASM 01/19/21   Cletis Athens, MD  fluvoxaMINE (LUVOX) 50 MG tablet Take 50-100 mg by mouth at bedtime.  05/23/17   [provider]  Fremanezumab-vfrm (AJOVY) 225 MG/1.5ML SOAJ Inject 225 mg into the skin every 30 (thirty) days. 02/16/21   Melvenia Beam, MD  gabapentin (NEURONTIN) 600 MG tablet TAKE 1 TABLET BY MOUTH 3 TIMES DAILY 01/19/21   Cletis Athens, MD  naratriptan (AMERGE) 2.5 MG tablet Take 1 tablet (2.5 mg total) by mouth as needed for migraine. Take one (1) tablet at onset of headache; if returns or does not resolve, may repeat after 4 hours; do not exceed five (5) mg in 24 hours. 02/17/21   Melvenia Beam, MD  norgestimate-ethinyl estradiol ([place] 28) 0.25-35 MG-MCG tablet Take 1 tablet by mouth daily. 07/17/19   Lucille Passy, MD  ondansetron (ZOFRAN-ODT) 8 MG disintegrating tablet Take 0.5 tablets (4 mg total) by mouth every 8 (eight) hours as needed for nausea or vomiting. 02/17/21   Melvenia Beam, MD  verapamil (CALAN-SR) 120 MG CR tablet TAKE ONE TABLET BY MOUTH AT BEDTIME 09/15/20   Cletis Athens, MD  vitamin B-12 (CYANOCOBALAMIN) 1000 MCG tablet Take 1,000 mcg by mouth daily.    [provider]    Allergies Lamotrigine, Abilify [aripiprazole], Azithromycin, Depakote [divalproex sodium], Erythromycin base, Lithium, Penicillins,  Pregabalin, Seroquel [quetiapine fumarate], Sulfa antibiotics, Tetanus toxoids, Tramadol, and Ziprasidone hcl  Family History  Problem Relation Age of Onset   Diabetes Mother    Hypertension Mother    Brain cancer Father    Ovarian cancer Maternal Grandmother        metastasized   Lung cancer Maternal Grandfather    Cancer Paternal Grandmother        not sure of the type   Cancer Paternal Grandfather        not sure of the type   Migraines Neg Hx     Social History Social History   Tobacco Use   Smoking status: Never   Smokeless tobacco: Never  Vaping Use   Vaping Use: Never used  Substance Use Topics   Alcohol use: No    Alcohol/week: 0.0 standard drinks   Drug use: No    Review of Systems Constitutional:  No fever/chills Eyes: No visual changes. ENT: No sore throat. Cardiovascular: Denies chest pain. Respiratory: Denies shortness of breath. Gastrointestinal: No abdominal pain.   Genitourinary: Negative for dysuria. Musculoskeletal: Feels like the muscles across the upper back are sore pain in her forearms bilaterally.  Sometimes pain in her legs everything feels very achy in nature.  Worse with movements Skin: Negative for rash. Neurological: Negative for headaches, areas of focal weakness or numbness except for some chronic numbness from pinched nerve in her neck, unchanged has previously seen neurosurgery for same.    ____________________________________________   PHYSICAL EXAM:  VITAL SIGNS: ED Triage Vitals  Enc Vitals Group     BP 02/21/21 0355 114/62     Pulse Rate 02/21/21 0355 (!) 113     Resp 02/21/21 0355 16     Temp 02/21/21 0355 98.2 F (36.8 C)     Temp src --      SpO2 02/21/21 0355 98 %     Weight 02/21/21 0354 155 lb (70.3 kg)     Height 02/21/21 0354 '5\' 9"'  (1.753 m)     Head Circumference --      Peak Flow --      Pain Score 02/21/21 0354 6     Pain Loc --      Pain Edu? --      Excl. in [place]? --     Constitutional: Alert and  oriented. Well appearing and in no acute distress.  She does appear to have pain and discomfort when she sits up and moves her arms and hands through range of motion.  Seems to have generalized myalgia like discomfort Eyes: Conjunctivae are normal. Head: Atraumatic. Nose: No congestion/rhinnorhea. Mouth/Throat: Mucous membranes are just slightly dry. Neck: No stridor.  Cardiovascular: Minimally tachycardic rate, regular rhythm. Grossly normal heart sounds.  Good peripheral circulation. Respiratory: Normal respiratory effort.  No retractions. Lungs CTAB. Gastrointestinal: Soft and nontender. No distention. Musculoskeletal: No lower extremity tenderness nor edema.  Normal use of the hands bilaterally 5-5 strength in all extremities.  Reports some achiness through range of motion without any joint effusions, no noted areas of erythema or rash. Neurologic:  Normal speech and language. No gross focal neurologic deficits are appreciated.  Skin:  Skin is warm, dry and intact. No rash noted. Psychiatric: Mood and affect are normal. Speech and behavior are normal.  ____________________________________________   LABS (all labs ordered are listed, but only abnormal results are displayed)  Labs Reviewed  COMPREHENSIVE METABOLIC PANEL - Abnormal; Notable for the following components:      Result Value   Calcium 8.4 (*)    All other components within normal limits  URINALYSIS, COMPLETE (UACMP) WITH MICROSCOPIC - Abnormal; Notable for the following components:   Color, Urine YELLOW (*)    Hgb urine dipstick SMALL (*)    Leukocytes,Ua LARGE (*)    All other components within normal limits  RESP PANEL BY RT-PCR (FLU A&B, COVID) ARPGX2  URINE CULTURE  CBC WITH DIFFERENTIAL/PLATELET  SEDIMENTATION RATE  CK   ____________________________________________  EKG  ED ECG REPORT I, Delman Kitten, the attending physician, personally viewed and interpreted this ECG.  Date: 02/21/2021 EKG Time: 440 Rate:  80 Rhythm: normal sinus rhythm QRS Axis: normal Intervals: normal ST/T Wave abnormalities: normal Narrative Interpretation: no evidence of acute ischemia  ____________________________________________  RADIOLOGY   ____________________________________________   PROCEDURES  Procedure(s) performed: None  Procedures  Critical Care performed: No  ____________________________________________   INITIAL IMPRESSION / ASSESSMENT AND  PLAN / ED COURSE  Pertinent labs & imaging results that were available during my care of the patient were reviewed by me and considered in my medical decision making (see chart for details).   Patient presents with generalized aches and pains.  The etiology is not exactly clear.  Will screen evaluate for inflammatory and infectious etiologies though none is obviously apparent by clinical exam.  She appears hemodynamically stabile with just slight tachycardia, no acute distress.  Reassuring clinical exam, given her complaint will initiate laboratory analysis check CK, provide hydrocodone for pain relief here, and monitor closely.  My overall gestalt is that this may be representative rheumatic type etiology, possibly flareup of her fibromyalgia related this is not certain at this time.  Denies any insect bites or infectious symptoms.  Reassuring clinical exam.    Clinical Course as of 02/21/21 0547  Sat Feb 21, 2021  6184 Urinalysis reviewed, notable leukocytes and white blood cells but no bacteria are noted.  In addition appears somewhat contaminated.  Given the patient's lack of fever and dysuria, at this point we will order culture.  Would recommend treatment if culture yields bacteria consistent with possible UTI [MQ]  0540 Medications reviewed, I do not see an obvious cause but could be medication related.  Not any statins. [MQ]    Clinical Course User Index [MQ] Delman Kitten, MD   ----------------------------------------- 5:48 AM on  02/21/2021 ----------------------------------------- Labs reviewed, quite reassuring.  Normal CBC white count ESR and metabolic panel.  Very minimally reduced calcium which I do not think would be contributory to symptoms  Patient resting comfortably, feels improved after receiving medication and fluids.  Husband will be driving her home, discussed careful return precautions and will follow up with Dr. Rebecka Apley.  Return precautions and treatment recommendations and follow-up discussed with the patient who is agreeable with the plan.   ____________________________________________   FINAL CLINICAL IMPRESSION(S) / ED DIAGNOSES  Final diagnoses:  Myalgia, unspecified site        Note:  This document was prepared using Dragon voice recognition software and may include unintentional dictation errors       Delman Kitten, MD 02/21/21 [phone]

## 2021-02-21 NOTE — ED Triage Notes (Addendum)
C/o general pain to bone x3days, 'feels different from my fibromyalgia,' denies fevers nor n/v/d

## 2021-02-21 NOTE — Discharge Instructions (Addendum)
Please follow-up closely with your primary care physician, please call their office on Monday.  Return to the ER right away if you develop worsening or new concerning symptoms such as a fever, severe fatigue or weakness, headache, chest pain trouble breathing or other new concerns arise.  No driving this morning is you are given potentially sedating pain medicine here at the ER

## 2021-02-23 LAB — URINE CULTURE

## 2021-02-24 DIAGNOSIS — F3162 Bipolar disorder, current episode mixed, moderate: Secondary | ICD-10-CM | POA: Diagnosis not present

## 2021-02-24 DIAGNOSIS — F411 Generalized anxiety disorder: Secondary | ICD-10-CM | POA: Diagnosis not present

## 2021-02-24 DIAGNOSIS — G478 Other sleep disorders: Secondary | ICD-10-CM | POA: Diagnosis not present

## 2021-03-03 ENCOUNTER — Other Ambulatory Visit: Payer: Self-pay

## 2021-03-03 ENCOUNTER — Ambulatory Visit (INDEPENDENT_AMBULATORY_CARE_PROVIDER_SITE_OTHER): Payer: Medicare HMO | Admitting: Internal Medicine

## 2021-03-03 ENCOUNTER — Encounter: Payer: Self-pay | Admitting: Internal Medicine

## 2021-03-03 VITALS — BP 110/74 | HR 100 | Ht 69.0 in

## 2021-03-03 DIAGNOSIS — M791 Myalgia, unspecified site: Secondary | ICD-10-CM | POA: Diagnosis not present

## 2021-03-03 DIAGNOSIS — M797 Fibromyalgia: Secondary | ICD-10-CM

## 2021-03-03 DIAGNOSIS — G90A Postural orthostatic tachycardia syndrome (POTS): Secondary | ICD-10-CM

## 2021-03-03 DIAGNOSIS — G43801 Other migraine, not intractable, with status migrainosus: Secondary | ICD-10-CM | POA: Diagnosis not present

## 2021-03-03 DIAGNOSIS — R232 Flushing: Secondary | ICD-10-CM | POA: Diagnosis not present

## 2021-03-03 DIAGNOSIS — K580 Irritable bowel syndrome with diarrhea: Secondary | ICD-10-CM | POA: Diagnosis not present

## 2021-03-03 DIAGNOSIS — I10 Essential (primary) hypertension: Secondary | ICD-10-CM

## 2021-03-03 DIAGNOSIS — M5410 Radiculopathy, site unspecified: Secondary | ICD-10-CM | POA: Diagnosis not present

## 2021-03-03 DIAGNOSIS — F310 Bipolar disorder, current episode hypomanic: Secondary | ICD-10-CM

## 2021-03-03 DIAGNOSIS — I498 Other specified cardiac arrhythmias: Secondary | ICD-10-CM | POA: Diagnosis not present

## 2021-03-03 DIAGNOSIS — Z1211 Encounter for screening for malignant neoplasm of colon: Secondary | ICD-10-CM

## 2021-03-03 DIAGNOSIS — G43709 Chronic migraine without aura, not intractable, without status migrainosus: Secondary | ICD-10-CM

## 2021-03-03 DIAGNOSIS — E785 Hyperlipidemia, unspecified: Secondary | ICD-10-CM

## 2021-03-03 MED ORDER — METHYLPREDNISOLONE 4 MG PO TBPK: ORAL_TABLET | ORAL | 1 refills | Status: DC

## 2021-03-03 NOTE — Assessment & Plan Note (Signed)
We will do a Cologuard test because patient did not want to have a colonoscopy, she is already seeing GI specialist for irritable bowel syndrome

## 2021-03-03 NOTE — Assessment & Plan Note (Signed)
We will treat the patient with tapering doses of prednisone.  We will see her back in a week.  I will do a limited rheumatological work-up, her sedimentation rate is within normal limits which we will check CRP antinuclear antibody and serum protein electrophoresis

## 2021-03-03 NOTE — Assessment & Plan Note (Signed)
Cannot tolerate statin advised her to take fish oil 2 capsules a day

## 2021-03-03 NOTE — Assessment & Plan Note (Signed)
Refer to the neurosurgeon.

## 2021-03-03 NOTE — Assessment & Plan Note (Signed)

## 2021-03-03 NOTE — Assessment & Plan Note (Signed)
Chronic problem. 

## 2021-03-03 NOTE — Assessment & Plan Note (Signed)
We will try a prednisone taper

## 2021-03-03 NOTE — Assessment & Plan Note (Signed)
Patient is under care of a psychiatrist, she does not have any manic symptoms

## 2021-03-03 NOTE — Assessment & Plan Note (Signed)
Stable with hydration

## 2021-03-03 NOTE — Progress Notes (Signed)
Established Patient Office Visit  Subjective:  Patient ID: Melissa [name], female    DOB: 05/22/1971  Age: 50 y.o. MRN: [place]:[id number]  CC:  Chief Complaint  Patient presents with   Pain    Patient complains of generalized pain and discomfort x 2-3 weeks. Visit to [facility] ED on 9/3. Blood work and EKG done with normal results. Patient also states that she is having trouble sleeping.     Insomnia Primary symptoms: sleep disturbance, difficulty falling asleep, malaise/fatigue.   The current episode started one month. The onset quality is gradual. The symptoms are aggravated by anxiety and pain. Nothing relieves the symptoms.   [name] presents for  Past Medical History:  Diagnosis Date   Arthritis    Bipolar disorder ([place])    Fibromyalgia    on disability   Migraine    Muscle spasm    Pain of right side of body    chronic   Panic attack    POTS (postural orthostatic tachycardia syndrome)    Syncope    recurrent   Uterine perforation    hx of    Past Surgical History:  Procedure Laterality Date   CATARACT EXTRACTION  dec 2014   DILATION AND CURETTAGE OF UTERUS     LASIK  dec 2014    Family History  Problem Relation Age of Onset   Diabetes Mother    Hypertension Mother    Brain cancer Father    Ovarian cancer Maternal Grandmother        metastasized   Lung cancer Maternal Grandfather    Cancer Paternal Grandmother        not sure of the type   Cancer Paternal Grandfather        not sure of the type   Migraines Neg Hx     Social History   Socioeconomic History   Marital status: Married    Spouse name: Izell [place]   Number of children: 0   Years of education: some college   Highest education level: Not on file  Occupational History    Employer: OTHER    Comment: disabled  Tobacco Use   Smoking status: Never   Smokeless tobacco: Never  Vaping Use   Vaping Use: Never used  Substance and Sexual Activity   Alcohol use: No    Alcohol/week: 0.0 standard  drinks   Drug use: No   Sexual activity: Yes    Birth control/protection: Surgical    Comment: also on OCPs  Other Topics Concern   Not on file  Social History Narrative   08/28/19   From: [place], moved because her husband was from [place]   Living: with husband Izell [place], since 1990   Work: disability due to fibromyalgia and bipolar disorder      Family: mother in law is nearby, her mother is in [place]; has a sister      Enjoys: crochet, baking, reading      Exercise: walking the dog, yoga - tries to do 3 times a week   Diet: not great, more sweets than she should      Safety   Seat belts: Yes    Guns: Yes  and secure   Safe in relationships: Yes          Right handed   Caffeine: none    Social Determinants of Health   Financial Resource Strain: Not on file  Food Insecurity: Not on file  Transportation Needs: Not on file  Physical  Activity: Not on file  Stress: Not on file  Social Connections: Not on file  Intimate Partner Violence: Not on file     Current Outpatient Medications:    methylPREDNISolone (MEDROL DOSEPAK) 4 MG TBPK tablet, Medrol Dosepak 4 mg as directed, Disp: 21 tablet, Rfl: 1   acetaminophen (TYLENOL) 500 MG tablet, Take 1,000 mg by mouth daily as needed for mild pain., Disp: , Rfl:    baclofen (LIORESAL) 10 MG tablet, Take 1 tablet (10 mg total) by mouth 3 (three) times daily., Disp: 30 each, Rfl: 0   buPROPion (WELLBUTRIN XL) 150 MG 24 hr tablet, Take 1 tablet (150 mg total) by mouth daily., Disp: 90 tablet, Rfl: 3   cholecalciferol (VITAMIN D) 1000 units tablet, Take 5,000 Units by mouth daily., Disp: , Rfl:    clonazePAM (KLONOPIN) 1 MG tablet, Take 1 tablet (1 mg total) by mouth 2 (two) times daily as needed. for anxiety, Disp: 60 tablet, Rfl: 0   cyclobenzaprine (FLEXERIL) 10 MG tablet, TAKE 1 TABLET BY MOUTH 3 TIMES A DAY AS NEEDED FOR MUSCLE SPASM, Disp: 90 tablet, Rfl: 0   fluvoxaMINE (LUVOX) 50 MG tablet, Take 50-100 mg by mouth at bedtime. , Disp: ,  Rfl:    Fremanezumab-vfrm (AJOVY) 225 MG/1.5ML SOAJ, Inject 225 mg into the skin every 30 (thirty) days., Disp: 9 mL, Rfl: 0   gabapentin (NEURONTIN) 600 MG tablet, TAKE 1 TABLET BY MOUTH 3 TIMES DAILY, Disp: 90 tablet, Rfl: 3   naratriptan (AMERGE) 2.5 MG tablet, Take 1 tablet (2.5 mg total) by mouth as needed for migraine. Take one (1) tablet at onset of headache; if returns or does not resolve, may repeat after 4 hours; do not exceed five (5) mg in 24 hours., Disp: 9 tablet, Rfl: 11   norgestimate-ethinyl estradiol (SPRINTEC 28) 0.25-35 MG-MCG tablet, Take 1 tablet by mouth daily., Disp: 6 Package, Rfl: 1   ondansetron (ZOFRAN-ODT) 8 MG disintegrating tablet, Take 0.5 tablets (4 mg total) by mouth every 8 (eight) hours as needed for nausea or vomiting., Disp: 30 tablet, Rfl: 0   verapamil (CALAN-SR) 120 MG CR tablet, TAKE ONE TABLET BY MOUTH AT BEDTIME, Disp: 90 tablet, Rfl: 3   vitamin B-12 (CYANOCOBALAMIN) 1000 MCG tablet, Take 1,000 mcg by mouth daily., Disp: , Rfl:    Allergies  Allergen Reactions   Lamotrigine Hives and Swelling   Abilify [Aripiprazole] Other (See Comments)    Weight gain, did not help,    Azithromycin Rash   Depakote [Divalproex Sodium] Other (See Comments)    Hair loss   Erythromycin Base Swelling   Lithium Rash   Penicillins Rash    Has patient had a PCN reaction causing immediate rash, facial/tongue/throat swelling, SOB or lightheadedness with hypotension: Yes- swelling, rash  Has patient had a PCN reaction causing severe rash involving mucus membranes or skin necrosis: Yes Has patient had a PCN reaction that required hospitalization No Has patient had a PCN reaction occurring within the last 10 years: No If all of the above answers are "NO", then may proceed with Cephalosporin use.    Pregabalin Palpitations    Manic symptoms, no sleep x's 3 days, no appetite.   Seroquel [Quetiapine Fumarate] Other (See Comments)    Irritable   Sulfa Antibiotics Rash    Tetanus Toxoids Other (See Comments)    Swelling to injection site   Tramadol Nausea And Vomiting   Ziprasidone Hcl Other (See Comments)    Heat/cold intolerance.  ROS Review of Systems  Constitutional:  Positive for malaise/fatigue.  HENT: Negative.    Eyes: Negative.   Respiratory: Negative.  Negative for cough, choking, chest tightness, shortness of breath and wheezing.   Cardiovascular:  Positive for palpitations. Negative for chest pain and leg swelling.  Gastrointestinal:  Positive for constipation. Negative for blood in stool.  Endocrine: Negative.   Genitourinary: Negative.  Negative for dysuria, hematuria, pelvic pain and vaginal bleeding.  Musculoskeletal:  Positive for arthralgias, back pain, myalgias, neck pain and neck stiffness. Negative for gait problem and joint swelling.  Skin: Negative.  Negative for rash.  Allergic/Immunologic: Negative.   Neurological: Negative.   Hematological: Negative.   Psychiatric/Behavioral:  Positive for sleep disturbance. Negative for agitation, behavioral problems, hallucinations, self-injury and suicidal ideas. The patient has insomnia. The patient is not hyperactive.   All other systems reviewed and are negative.    Objective:    Physical Exam Vitals reviewed.  Constitutional:      Appearance: Normal appearance.  HENT:     Mouth/Throat:     Mouth: Mucous membranes are moist.  Eyes:     Pupils: Pupils are equal, round, and reactive to light.  Neck:     Vascular: No carotid bruit.  Cardiovascular:     Rate and Rhythm: Normal rate and regular rhythm.     Pulses: Normal pulses.     Heart sounds: Normal heart sounds.  Pulmonary:     Effort: Pulmonary effort is normal.     Breath sounds: Normal breath sounds.  Abdominal:     General: Bowel sounds are normal.     Palpations: Abdomen is soft. There is no hepatomegaly, splenomegaly or mass.     Tenderness: There is no abdominal tenderness.     Hernia: No hernia is present.   Musculoskeletal:        General: No tenderness.     Cervical back: Neck supple.     Right lower leg: No edema.     Left lower leg: No edema.  Skin:    Coloration: Skin is not jaundiced.     Findings: No bruising, erythema or rash.  Neurological:     Mental Status: She is alert and oriented to person, place, and time.     Motor: No weakness.     Gait: Gait normal.  Psychiatric:        Mood and Affect: Mood and affect normal.        Behavior: Behavior normal.        Judgment: Judgment normal.    BP 110/74   Pulse 100   Ht '5\' 9"'$  (1.753 m)   BMI 22.89 kg/m  Wt Readings from Last 3 Encounters:  02/21/21 155 lb (70.3 kg)  02/16/21 157 lb (71.2 kg)  12/10/20 157 lb 6.4 oz (71.4 kg)     Health Maintenance Due  Topic Date Due   Hepatitis C Screening  Never done   COLONOSCOPY (Pts 45-1yr Insurance coverage will need to be confirmed)  Never done   Zoster Vaccines- Shingrix (1 of 2) Never done   INFLUENZA VACCINE  Never done    There are no preventive care reminders to display for this patient.  Lab Results  Component Value Date   TSH 0.41 04/28/2020   Lab Results  Component Value Date   WBC 7.4 02/21/2021   HGB 14.0 02/21/2021   HCT 41.0 02/21/2021   MCV 87.0 02/21/2021   PLT 273 02/21/2021   Lab Results  Component Value  Date   NA 137 02/21/2021   K 4.0 02/21/2021   CO2 24 02/21/2021   GLUCOSE 93 02/21/2021   BUN 13 02/21/2021   CREATININE 1.00 02/21/2021   BILITOT 0.5 02/21/2021   ALKPHOS 44 02/21/2021   AST 19 02/21/2021   ALT 12 02/21/2021   PROT 7.0 02/21/2021   ALBUMIN 3.7 02/21/2021   CALCIUM 8.4 (L) 02/21/2021   ANIONGAP 7 02/21/2021   GFR 68.49 05/09/2017   Lab Results  Component Value Date   CHOL 245 (H) 06/24/2016   Lab Results  Component Value Date   HDL 40.20 06/24/2016   Lab Results  Component Value Date   LDLCALC 114 (H) 02/04/2012   Lab Results  Component Value Date   TRIG 330 (H) 03/03/2020   Lab Results  Component Value  Date   CHOLHDL 6 06/24/2016   Lab Results  Component Value Date   HGBA1C 5.2 04/28/2020      Assessment & Plan:   Problem List Items Addressed This Visit       Cardiovascular and Mediastinum   Migraine    Chronic problem      POTS (postural orthostatic tachycardia syndrome)    Stable with hydration      Hot flashes   Essential hypertension     Patient denies any chest pain or shortness of breath there is no history of palpitation or paroxysmal nocturnal dyspnea   patient was advised to follow low-salt low-cholesterol diet    ideally I want to keep systolic blood pressure below 130 mmHg, patient was asked to check blood pressure one times a week and give me a report on that.  Patient will be follow-up in 3 months  or earlier as needed, patient will call me back for any change in the cardiovascular symptoms Patient was advised to buy a book from local bookstore concerning blood pressure and read several chapters  every day.  This will be supplemented by some of the material we will give him from the office.  Patient should also utilize other resources like YouTube and Internet to learn more about the blood pressure and the diet.       Chronic migraine without aura without status migrainosus, not intractable   Relevant Medications   methylPREDNISolone (MEDROL DOSEPAK) 4 MG TBPK tablet     Digestive   IBS (irritable bowel syndrome)    We will do a Cologuard test because patient did not want to have a colonoscopy, she is already seeing GI specialist for irritable bowel syndrome        Nervous and Auditory   Radiculopathy affecting upper extremity    Refer to the neurosurgeon.        Other   Fibromyalgia    We will try a prednisone taper      Relevant Medications   methylPREDNISolone (MEDROL DOSEPAK) 4 MG TBPK tablet   Bipolar disorder ([place])    Patient is under care of a psychiatrist, she does not have any manic symptoms      Dyslipidemia    Cannot tolerate  statin advised her to take fish oil 2 capsules a day      Myalgia - Primary    We will treat the patient with tapering doses of prednisone.  We will see her back in a week.  I will do a limited rheumatological work-up, her sedimentation rate is within normal limits which we will check CRP antinuclear antibody and serum protein electrophoresis      Relevant  Orders   ANA   C-reactive protein   Serum protein electrophoresis with reflex   Other Visit Diagnoses     Screening for colon cancer       Relevant Orders   Cologuard       Meds ordered this encounter  Medications   methylPREDNISolone (MEDROL DOSEPAK) 4 MG TBPK tablet    Sig: Medrol Dosepak 4 mg as directed    Dispense:  21 tablet    Refill:  1     Follow-up: No follow-ups on file.    Cletis Athens, MD

## 2021-03-07 ENCOUNTER — Other Ambulatory Visit: Payer: Self-pay | Admitting: Internal Medicine

## 2021-03-08 DIAGNOSIS — Z1211 Encounter for screening for malignant neoplasm of colon: Secondary | ICD-10-CM | POA: Diagnosis not present

## 2021-03-09 LAB — PROTEIN ELECTROPHORESIS, SERUM, WITH REFLEX
Albumin ELP: 4.4 g/dL (ref 3.8–4.8)
Alpha 1: 0.3 g/dL (ref 0.2–0.3)
Alpha 2: 0.7 g/dL (ref 0.5–0.9)
Beta 2: 0.3 g/dL (ref 0.2–0.5)
Beta Globulin: 0.5 g/dL (ref 0.4–0.6)
Gamma Globulin: 0.8 g/dL (ref 0.8–1.7)
Total Protein: 7 g/dL (ref 6.1–8.1)

## 2021-03-09 LAB — IFE INTERPRETATION: Immunofix Electr Int: NOT DETECTED

## 2021-03-09 LAB — ANA: Anti Nuclear Antibody (ANA): NEGATIVE

## 2021-03-09 LAB — C-REACTIVE PROTEIN: CRP: 3.1 mg/L (ref <8.0)

## 2021-03-10 ENCOUNTER — Encounter: Payer: Self-pay | Admitting: Internal Medicine

## 2021-03-10 ENCOUNTER — Ambulatory Visit (INDEPENDENT_AMBULATORY_CARE_PROVIDER_SITE_OTHER): Payer: Medicare HMO | Admitting: Internal Medicine

## 2021-03-10 ENCOUNTER — Other Ambulatory Visit: Payer: Self-pay

## 2021-03-10 VITALS — BP 122/80 | HR 96 | Ht 69.0 in | Wt 155.2 lb

## 2021-03-10 DIAGNOSIS — M791 Myalgia, unspecified site: Secondary | ICD-10-CM

## 2021-03-10 DIAGNOSIS — I498 Other specified cardiac arrhythmias: Secondary | ICD-10-CM | POA: Diagnosis not present

## 2021-03-10 DIAGNOSIS — G478 Other sleep disorders: Secondary | ICD-10-CM | POA: Diagnosis not present

## 2021-03-10 DIAGNOSIS — F3170 Bipolar disorder, currently in remission, most recent episode unspecified: Secondary | ICD-10-CM | POA: Diagnosis not present

## 2021-03-10 DIAGNOSIS — K580 Irritable bowel syndrome with diarrhea: Secondary | ICD-10-CM

## 2021-03-10 DIAGNOSIS — I10 Essential (primary) hypertension: Secondary | ICD-10-CM

## 2021-03-10 DIAGNOSIS — G90A Postural orthostatic tachycardia syndrome (POTS): Secondary | ICD-10-CM

## 2021-03-10 NOTE — Assessment & Plan Note (Addendum)
Patient was advised sleep hygiene, she is taking temazepam 15 mg p.o. at bedtime at the present time she does not smoke does not drink.  Lab tests were reviewed and they were found to be within normal range.

## 2021-03-10 NOTE — Assessment & Plan Note (Signed)
Stable at the present time. 

## 2021-03-10 NOTE — Progress Notes (Signed)
Established Patient Office Visit  Subjective:  Patient ID: Melissa [name], female    DOB: 01/02/1971  Age: 50 y.o. MRN: [id number]  CC:  Chief Complaint  Patient presents with   Follow-up    1 week follow up    HPI  Melissa [name] presents for insomnia and lab check  Past Medical History:  Diagnosis Date   Arthritis    Bipolar disorder ([place])    Fibromyalgia    on disability   Migraine    Muscle spasm    Pain of right side of body    chronic   Panic attack    POTS (postural orthostatic tachycardia syndrome)    Syncope    recurrent   Uterine perforation    hx of    Past Surgical History:  Procedure Laterality Date   CATARACT EXTRACTION  dec 2014   DILATION AND CURETTAGE OF UTERUS     LASIK  dec 2014    Family History  Problem Relation Age of Onset   Diabetes Mother    Hypertension Mother    Brain cancer Father    Ovarian cancer Maternal Grandmother        metastasized   Lung cancer Maternal Grandfather    Cancer Paternal Grandmother        not sure of the type   Cancer Paternal Grandfather        not sure of the type   Migraines Neg Hx     Social History   Socioeconomic History   Marital status: Married    Spouse name: [name]   Number of children: 0   Years of education: some college   Highest education level: Not on file  Occupational History    Employer: OTHER    Comment: disabled  Tobacco Use   Smoking status: Never   Smokeless tobacco: Never  Vaping Use   Vaping Use: Never used  Substance and Sexual Activity   Alcohol use: No    Alcohol/week: 0.0 standard drinks   Drug use: No   Sexual activity: Yes    Birth control/protection: Surgical    Comment: also on OCPs  Other Topics Concern   Not on file  Social History Narrative   08/28/19   From: [place], moved because her husband was from [place]: with husband Izell , since 1990   Work: disability due to fibromyalgia and bipolar disorder      Family: mother in law is nearby, her  mother is in [place]; has a sister      Enjoys: crochet, baking, reading      Exercise: walking the dog, yoga - tries to do 3 times a week   Diet: not great, more sweets than she should      Safety   Seat belts: Yes    Guns: Yes  and secure   Safe in relationships: Yes          Right handed   Caffeine: none    Social Determinants of Radio broadcast assistant Strain: Not on file  Food Insecurity: Not on file  Transportation Needs: Not on file  Physical Activity: Not on file  Stress: Not on file  Social Connections: Not on file  Intimate Partner Violence: Not on file     Current Outpatient Medications:    acetaminophen (TYLENOL) 500 MG tablet, Take 1,000 mg by mouth daily as needed for mild pain., Disp: , Rfl:    baclofen (LIORESAL) 10 MG tablet, Take  1 tablet (10 mg total) by mouth 3 (three) times daily., Disp: 30 each, Rfl: 0   buPROPion (WELLBUTRIN XL) 150 MG 24 hr tablet, Take 1 tablet (150 mg total) by mouth daily., Disp: 90 tablet, Rfl: 3   cholecalciferol (VITAMIN D) 1000 units tablet, Take 5,000 Units by mouth daily., Disp: , Rfl:    clonazePAM (KLONOPIN) 1 MG tablet, Take 1 tablet (1 mg total) by mouth 2 (two) times daily as needed. for anxiety, Disp: 60 tablet, Rfl: 0   cyclobenzaprine (FLEXERIL) 10 MG tablet, TAKE 1 TABLET BY MOUTH 3 TIMES A DAY AS NEEDED FOR MUSCLE SPASM, Disp: 90 tablet, Rfl: 0   fluvoxaMINE (LUVOX) 50 MG tablet, Take 50-100 mg by mouth at bedtime. , Disp: , Rfl:    Fremanezumab-vfrm (AJOVY) 225 MG/1.5ML SOAJ, Inject 225 mg into the skin every 30 (thirty) days., Disp: 9 mL, Rfl: 0   gabapentin (NEURONTIN) 600 MG tablet, TAKE 1 TABLET BY MOUTH 3 TIMES DAILY, Disp: 90 tablet, Rfl: 3   methylPREDNISolone (MEDROL DOSEPAK) 4 MG TBPK tablet, Medrol Dosepak 4 mg as directed, Disp: 21 tablet, Rfl: 1   naratriptan (AMERGE) 2.5 MG tablet, Take 1 tablet (2.5 mg total) by mouth as needed for migraine. Take one (1) tablet at onset of headache; if returns or does  not resolve, may repeat after 4 hours; do not exceed five (5) mg in 24 hours., Disp: 9 tablet, Rfl: 11   norgestimate-ethinyl estradiol (SPRINTEC 28) 0.25-35 MG-MCG tablet, Take 1 tablet by mouth daily., Disp: 6 Package, Rfl: 1   ondansetron (ZOFRAN-ODT) 8 MG disintegrating tablet, Take 0.5 tablets (4 mg total) by mouth every 8 (eight) hours as needed for nausea or vomiting., Disp: 30 tablet, Rfl: 0   verapamil (CALAN-SR) 120 MG CR tablet, TAKE ONE TABLET BY MOUTH AT BEDTIME, Disp: 90 tablet, Rfl: 3   vitamin B-12 (CYANOCOBALAMIN) 1000 MCG tablet, Take 1,000 mcg by mouth daily., Disp: , Rfl:    Allergies  Allergen Reactions   Lamotrigine Hives and Swelling   Abilify [Aripiprazole] Other (See Comments)    Weight gain, did not help,    Azithromycin Rash   Depakote [Divalproex Sodium] Other (See Comments)    Hair loss   Erythromycin Base Swelling   Lithium Rash   Penicillins Rash    Has patient had a PCN reaction causing immediate rash, facial/tongue/throat swelling, SOB or lightheadedness with hypotension: Yes- swelling, rash  Has patient had a PCN reaction causing severe rash involving mucus membranes or skin necrosis: Yes Has patient had a PCN reaction that required hospitalization No Has patient had a PCN reaction occurring within the last 10 years: No If all of the above answers are "NO", then may proceed with Cephalosporin use.    Pregabalin Palpitations    Manic symptoms, no sleep x's 3 days, no appetite.   Seroquel [Quetiapine Fumarate] Other (See Comments)    Irritable   Sulfa Antibiotics Rash   Tetanus Toxoids Other (See Comments)    Swelling to injection site   Tramadol Nausea And Vomiting   Ziprasidone Hcl Other (See Comments)    Heat/cold intolerance.     ROS Review of Systems  Constitutional: Negative.   HENT: Negative.    Eyes: Negative.   Respiratory: Negative.    Cardiovascular: Negative.   Gastrointestinal: Negative.   Endocrine: Negative.   Genitourinary:  Negative.   Musculoskeletal: Negative.   Skin: Negative.   Allergic/Immunologic: Negative.   Neurological: Negative.   Hematological: Negative.   Psychiatric/Behavioral:  Negative.    All other systems reviewed and are negative.    Objective:    Physical Exam  BP 122/80   Pulse 96   Ht 5\' 9"  (1.753 m)   Wt 155 lb 3.2 oz (70.4 kg)   BMI 22.92 kg/m  Wt Readings from Last 3 Encounters:  03/10/21 155 lb 3.2 oz (70.4 kg)  02/21/21 155 lb (70.3 kg)  02/16/21 157 lb (71.2 kg)     Health Maintenance Due  Topic Date Due   Hepatitis C Screening  Never done   COLONOSCOPY (Pts 45-50yrs Insurance coverage will need to be confirmed)  Never done   Zoster Vaccines- Shingrix (1 of 2) Never done   INFLUENZA VACCINE  Never done    There are no preventive care reminders to display for this patient.  Lab Results  Component Value Date   TSH 0.41 04/28/2020   Lab Results  Component Value Date   WBC 7.4 02/21/2021   HGB 14.0 02/21/2021   HCT 41.0 02/21/2021   MCV 87.0 02/21/2021   PLT 273 02/21/2021   Lab Results  Component Value Date   NA 137 02/21/2021   K 4.0 02/21/2021   CO2 24 02/21/2021   GLUCOSE 93 02/21/2021   BUN 13 02/21/2021   CREATININE 1.00 02/21/2021   BILITOT 0.5 02/21/2021   ALKPHOS 44 02/21/2021   AST 19 02/21/2021   ALT 12 02/21/2021   PROT 7.0 03/03/2021   ALBUMIN 3.7 02/21/2021   CALCIUM 8.4 (L) 02/21/2021   ANIONGAP 7 02/21/2021   GFR 68.49 05/09/2017   Lab Results  Component Value Date   CHOL 245 (H) 06/24/2016   Lab Results  Component Value Date   HDL 40.20 06/24/2016   Lab Results  Component Value Date   LDLCALC 114 (H) 02/04/2012   Lab Results  Component Value Date   TRIG 330 (H) 03/03/2020   Lab Results  Component Value Date   CHOLHDL 6 06/24/2016   Lab Results  Component Value Date   HGBA1C 5.2 04/28/2020      Assessment & Plan:   Problem List Items Addressed This Visit       Cardiovascular and Mediastinum   POTS  (postural orthostatic tachycardia syndrome) - Primary    Stable at the present time      Essential hypertension     Patient denies any chest pain or shortness of breath there is no history of palpitation or paroxysmal nocturnal dyspnea   patient was advised to follow low-salt low-cholesterol diet    ideally I want to keep systolic blood pressure below 130 mmHg, patient was asked to check blood pressure one times a week and give me a report on that.  Patient will be follow-up in 3 months  or earlier as needed, patient will call me back for any change in the cardiovascular symptoms Patient was advised to buy a book from local bookstore concerning blood pressure and read several chapters  every day.  This will be supplemented by some of the material we will give him from the office.  Patient should also utilize other resources like YouTube and Internet to learn more about the blood pressure and the diet.        Digestive   IBS (irritable bowel syndrome)    Avoid Garlic.  And onion        Nervous and Auditory   Non-restorative sleep    Patient was advised sleep hygiene, she is taking temazepam 15 mg p.o. at  bedtime at the present time she does not smoke does not drink.  Lab tests were reviewed and they were found to be within normal range.        Other   Mixed bipolar I disorder in remission ([place])    Stable at the present time      Myalgia    Patient has a post COVID fatigue syndrome       No orders of the defined types were placed in this encounter.   Follow-up: No follow-ups on file.    Cletis Athens, MD

## 2021-03-10 NOTE — Assessment & Plan Note (Signed)

## 2021-03-10 NOTE — Assessment & Plan Note (Signed)
Patient has a post COVID fatigue syndrome

## 2021-03-10 NOTE — Assessment & Plan Note (Signed)
Avoid Garlic.  And onion

## 2021-03-11 ENCOUNTER — Ambulatory Visit: Payer: Medicare HMO | Admitting: Internal Medicine

## 2021-03-14 LAB — COLOGUARD: Cologuard: NEGATIVE

## 2021-03-17 DIAGNOSIS — F3162 Bipolar disorder, current episode mixed, moderate: Secondary | ICD-10-CM | POA: Diagnosis not present

## 2021-03-17 DIAGNOSIS — G478 Other sleep disorders: Secondary | ICD-10-CM | POA: Diagnosis not present

## 2021-03-17 DIAGNOSIS — F411 Generalized anxiety disorder: Secondary | ICD-10-CM | POA: Diagnosis not present

## 2021-04-16 ENCOUNTER — Other Ambulatory Visit: Payer: Self-pay | Admitting: Internal Medicine

## 2021-04-28 DIAGNOSIS — G478 Other sleep disorders: Secondary | ICD-10-CM | POA: Diagnosis not present

## 2021-04-28 DIAGNOSIS — F411 Generalized anxiety disorder: Secondary | ICD-10-CM | POA: Diagnosis not present

## 2021-04-28 DIAGNOSIS — F3162 Bipolar disorder, current episode mixed, moderate: Secondary | ICD-10-CM | POA: Diagnosis not present

## 2021-06-06 IMAGING — CT CT HEAD W/O CM
3 series · 16 of 46 positions shown, 19 images · non-contrast
Comparison: 07/29/2020

CLINICAL DATA: Recent fall, head trauma

EXAM:
CT HEAD WITHOUT CONTRAST
TECHNIQUE: Contiguous axial images were obtained from the base of the skull
through the vertex without intravenous contrast.

[Series 2: head wo · axial · 0.40mm/px · z∈[-64,+56]mm · 10 of 29 slices shown, 13 images]
[im 3/29  brain]
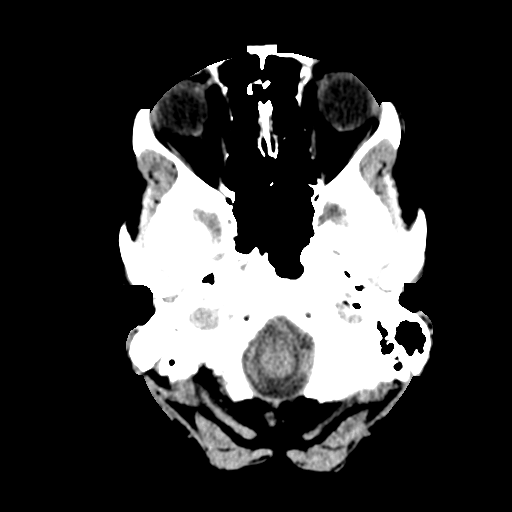
[im 3/29  bone]
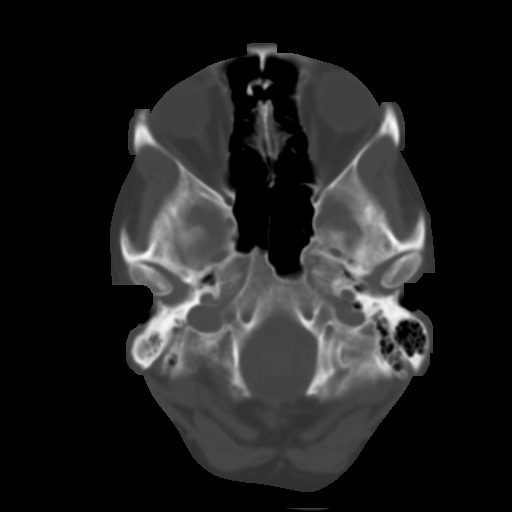
[im 6/29  brain]
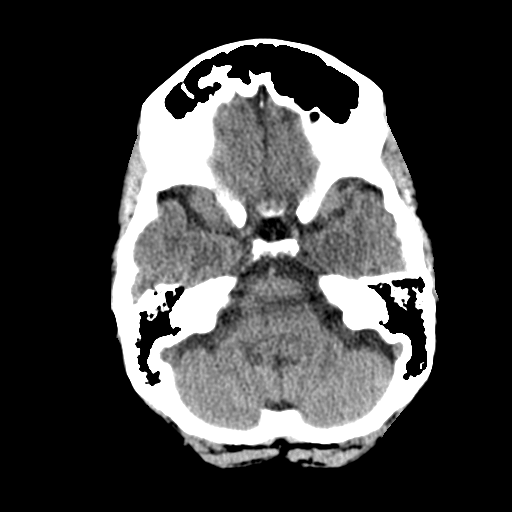
[im 8/29  brain]
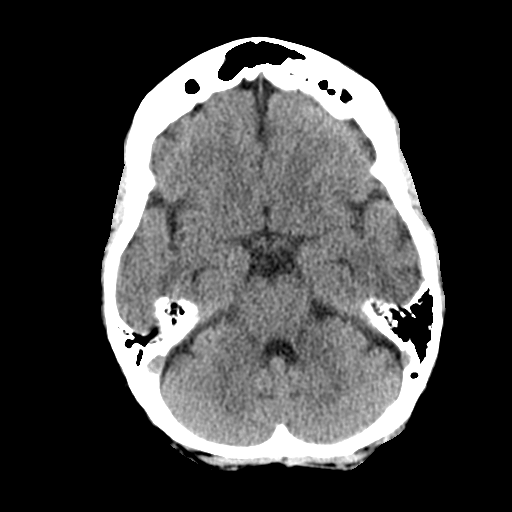
[im 11/29  brain]
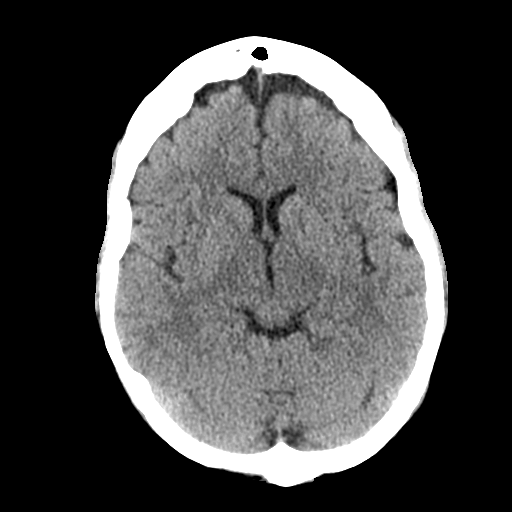
[im 14/29  brain]
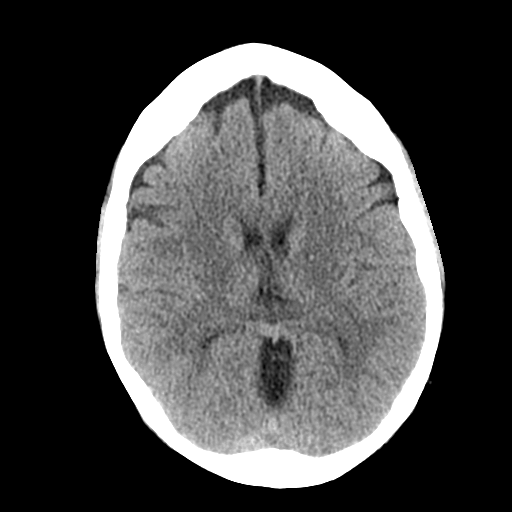
[im 14/29  bone]
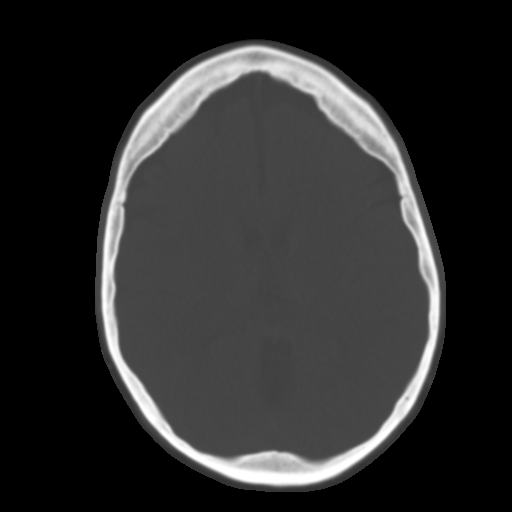
[im 16/29  brain]
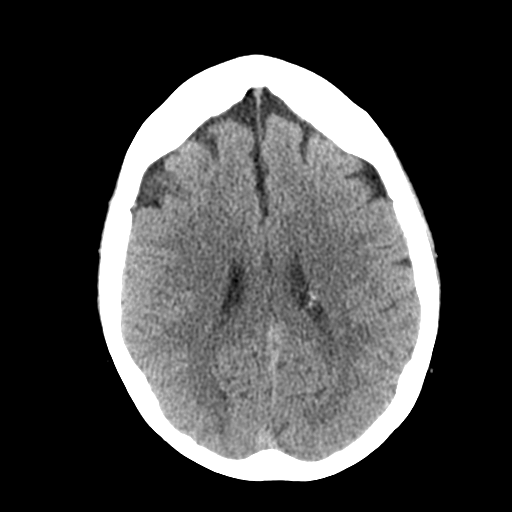
[im 19/29  brain]
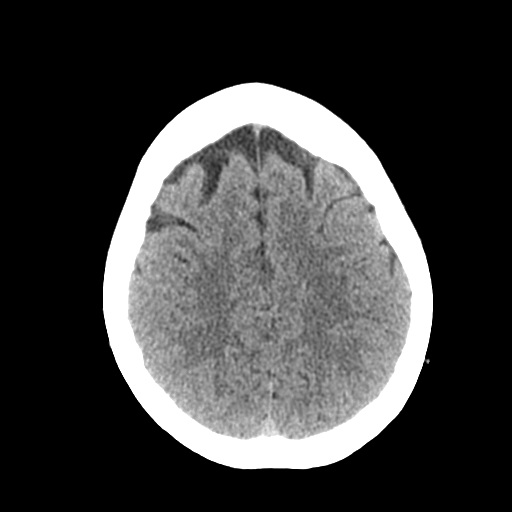
[im 22/29  brain]
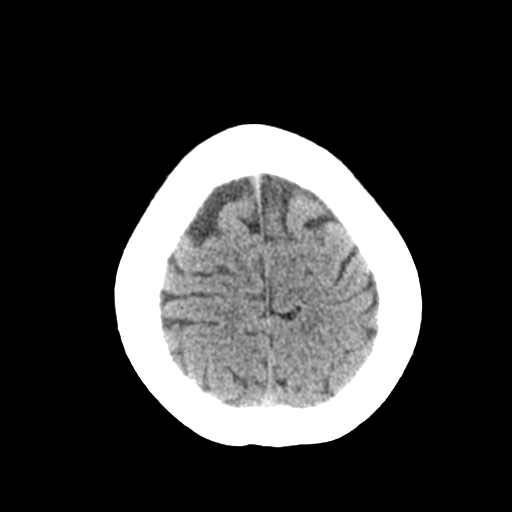
[im 24/29  brain]
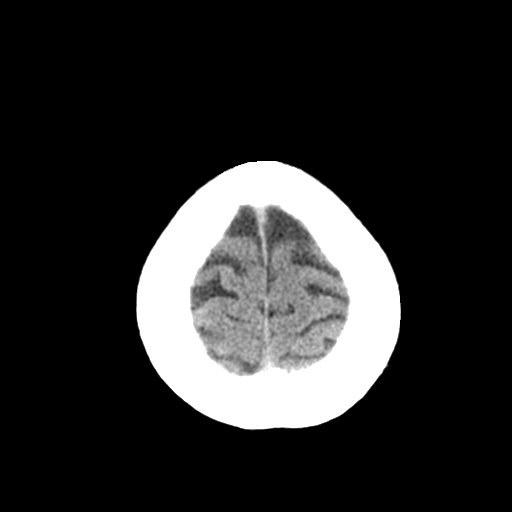
[im 24/29  bone]
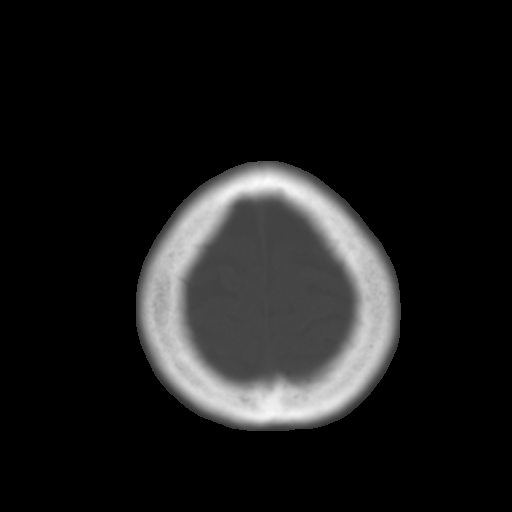
[im 27/29  brain]
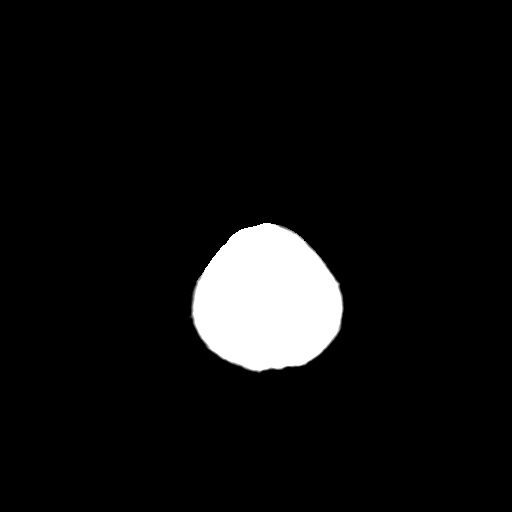

[Series 4: coronal soft tissue · coronal · 0.30mm/px · 3 of 64 slices shown]
[im 22/64  brain]
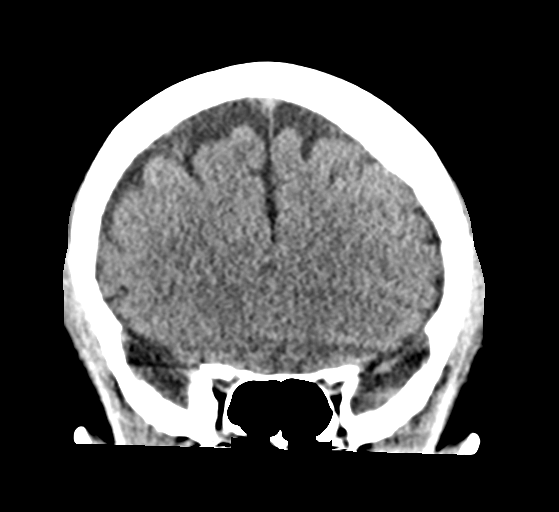
[im 29/64  brain]
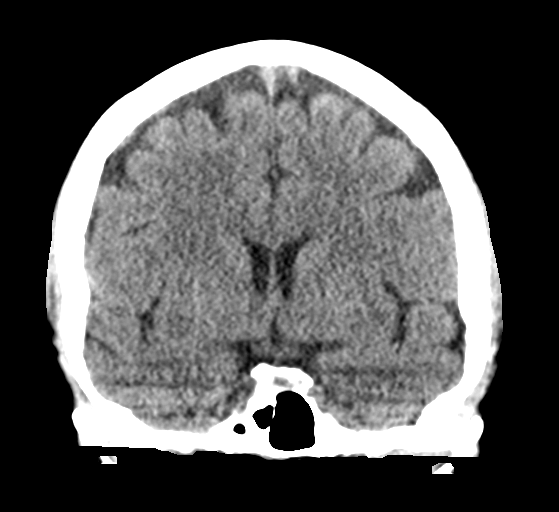
[im 36/64  brain]
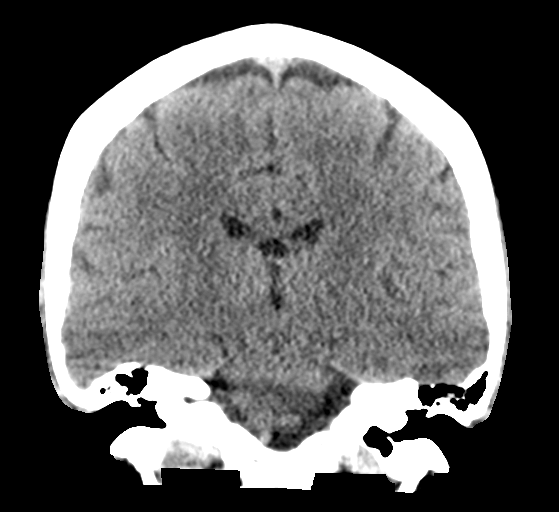

[Series 5: sagittal soft tissue · sagittal · 0.29mm/px · 3 of 55 slices shown]
[im 19/55  brain]
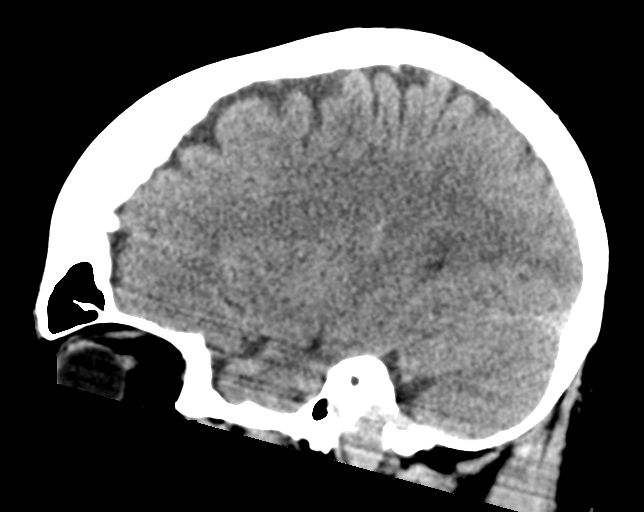
[im 28/55  brain]
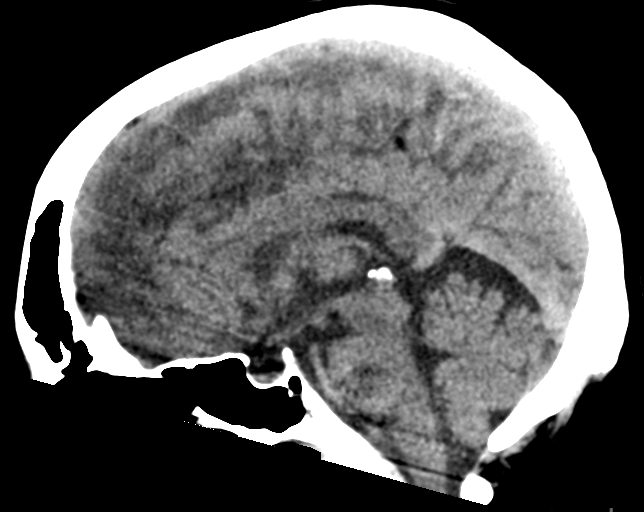
[im 37/55  brain]
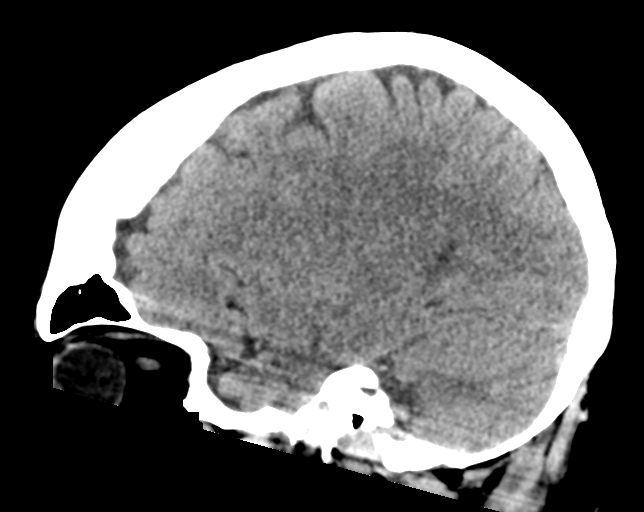

[16 of 46 positions shown; findings below may reference images not displayed]

FINDINGS: Brain: No evidence of acute infarction, hemorrhage, hydrocephalus,
extra-axial collection or mass lesion/mass effect.

Vascular: No hyperdense vessel or unexpected calcification.

Skull: Normal. Negative for fracture or focal lesion.

Sinuses/Orbits: No acute finding.

Other: None.
IMPRESSION: Normal head CT without contrast for age.  No interval change.

## 2021-06-06 IMAGING — MR MR LUMBAR SPINE W/O CM
5 series · 31 of 48 positions shown · non-contrast
Comparison: None.

CLINICAL DATA: Low back pain.  Fall.  Left leg numbness.

EXAM:
MRI LUMBAR SPINE WITHOUT CONTRAST
TECHNIQUE: Multiplanar, multisequence MR imaging of the lumbar spine was
performed. No intravenous contrast was administered.

[Series 16: T2 · sagittal · 4.0mm · 0.81mm/px · 6 of 17 slices shown (1 of 2)]
[im 1/17]
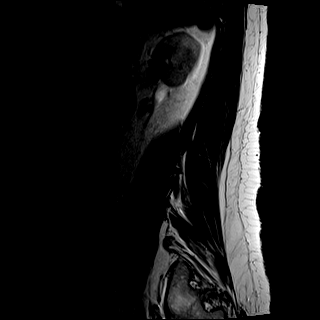
[im 4/17]
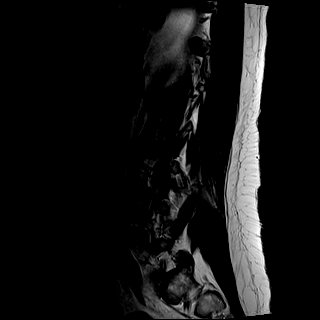
[im 7/17]
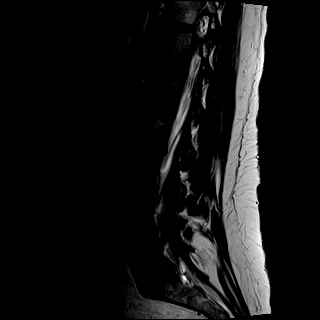
[im 10/17]
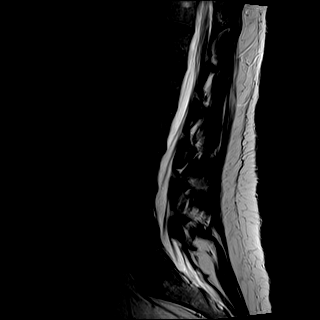
[im 13/17]
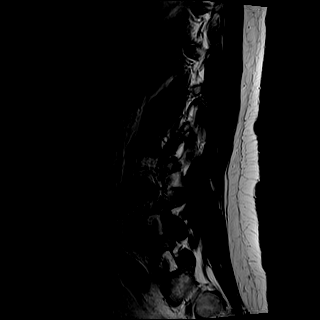
[im 17/17]
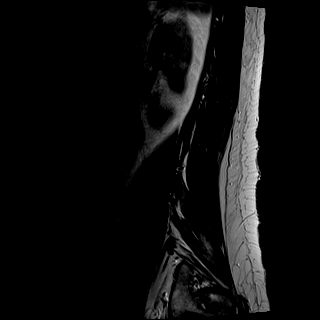

[Series 17: T1 · sagittal · 4.0mm · 0.81mm/px · 7 of 17 slices shown (1 of 2)]
[im 1/17]
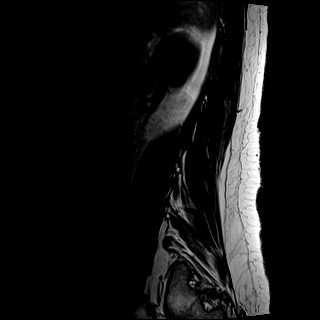
[im 3/17]
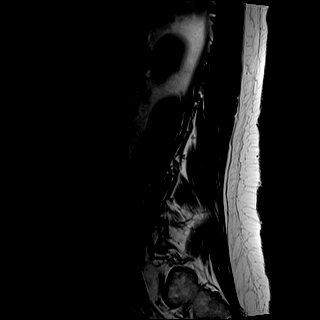
[im 6/17]
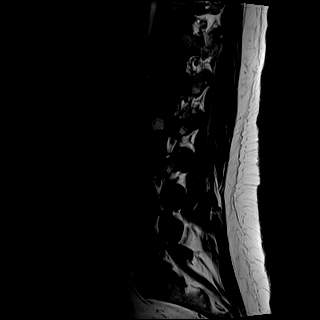
[im 9/17]
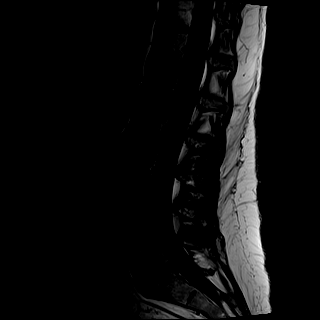
[im 11/17]
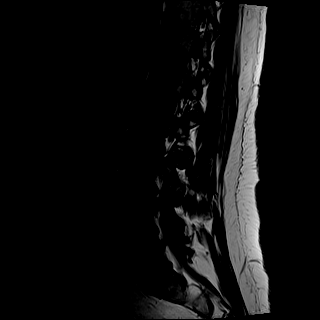
[im 14/17]
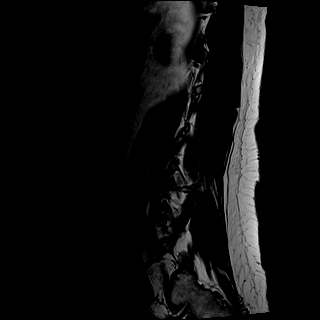
[im 17/17]
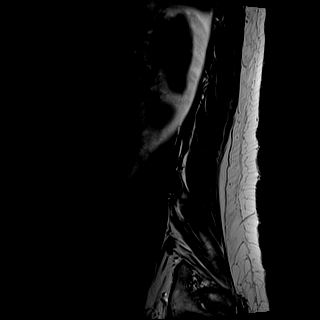

[Series 18: STIR · sagittal · 4.0mm · 0.41mm/px · 2 of 17 slices shown]
[im 1/17]
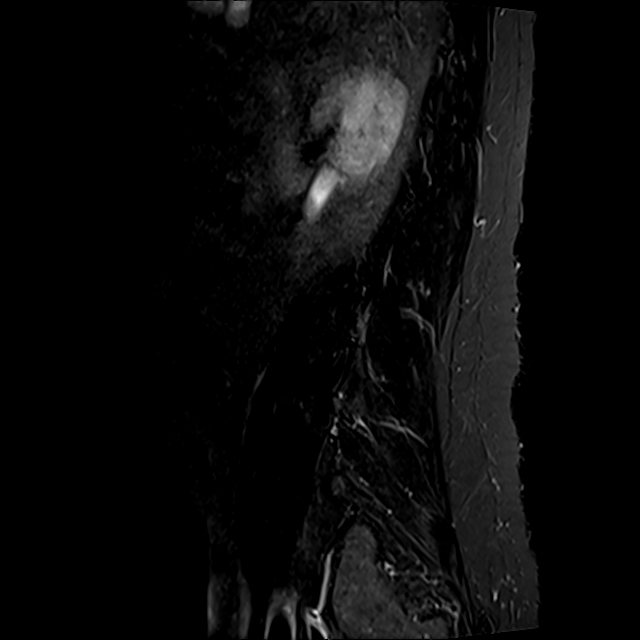
[im 3/17]
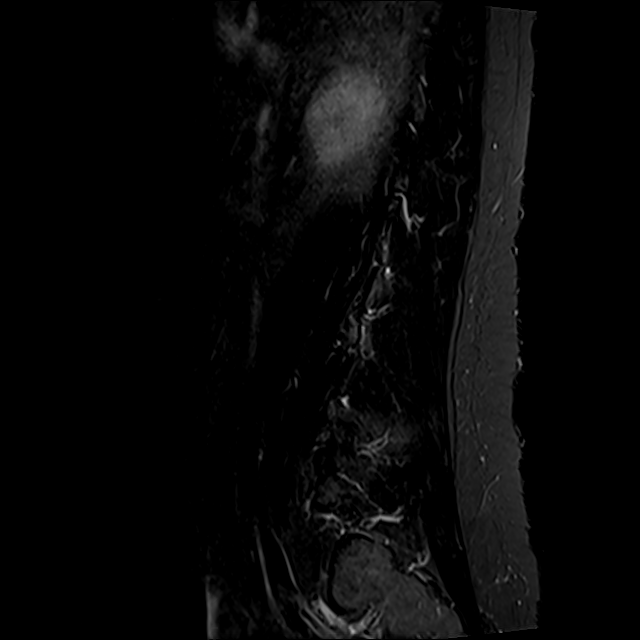

[Series 19: T2 · axial · 4.0mm · 0.78mm/px · z∈[-554,-329]mm · 8 of 36 slices shown (2 of 2)]
[im 1/36]
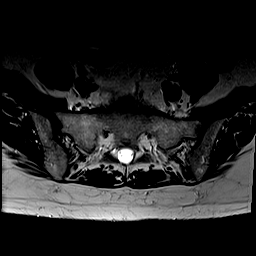
[im 6/36]
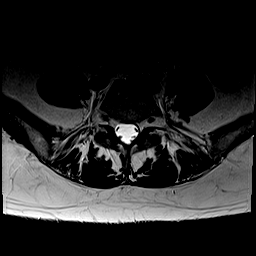
[im 11/36]
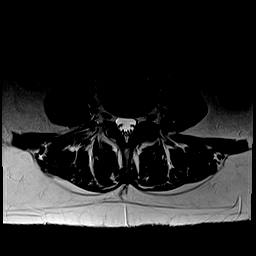
[im 17/36]
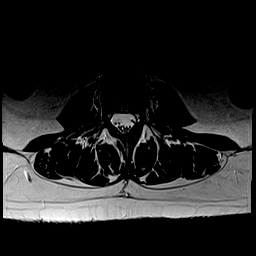
[im 19/36]
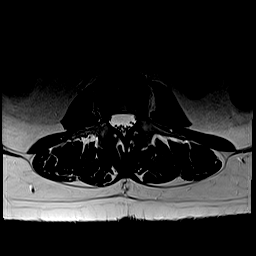
[im 25/36]
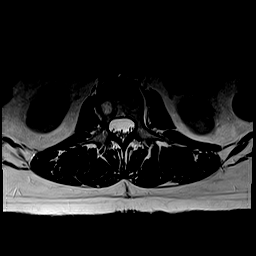
[im 30/36]
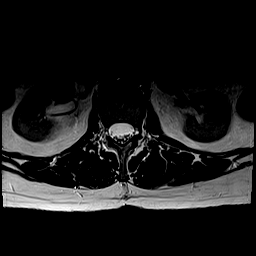
[im 36/36]
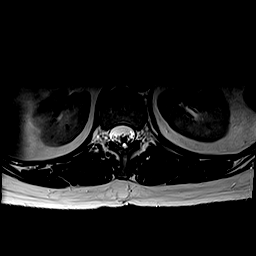

[Series 20: T1 · axial · 4.0mm · 0.39mm/px · z∈[-554,-329]mm · 8 of 36 slices shown (2 of 2)]
[im 1/36]
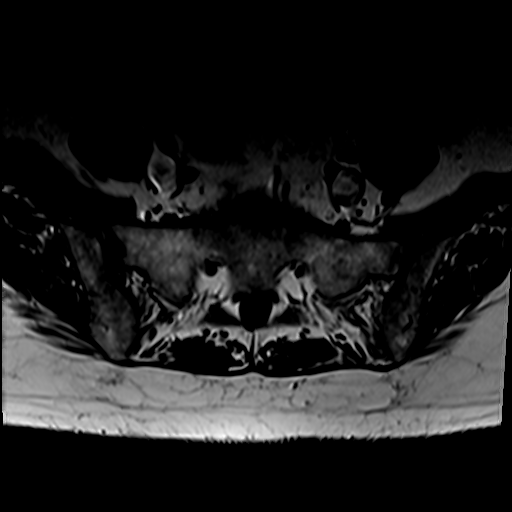
[im 6/36]
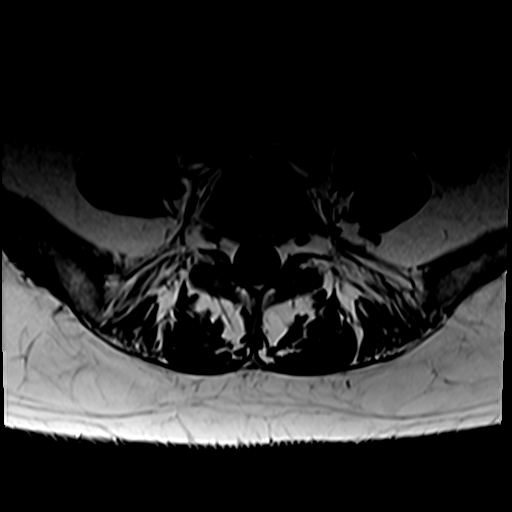
[im 11/36]
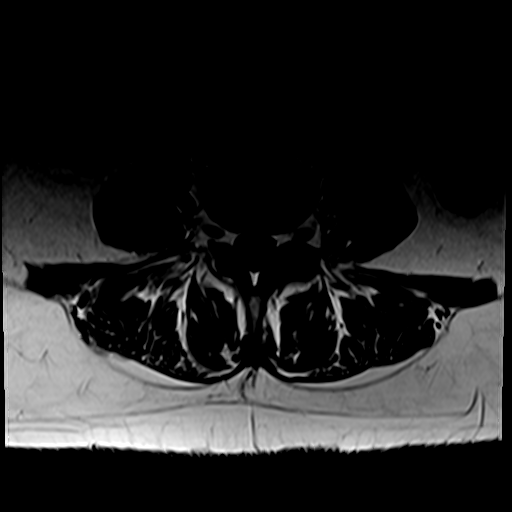
[im 17/36]
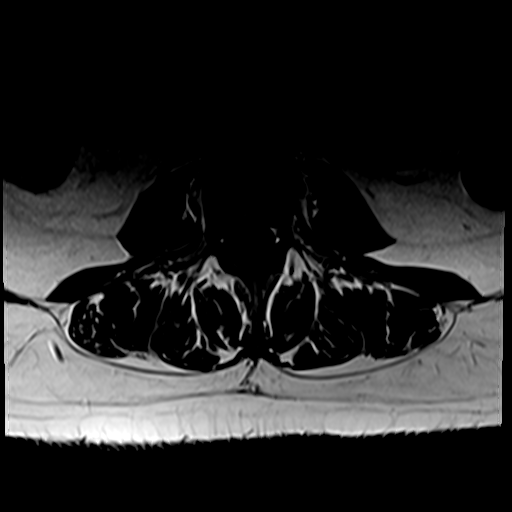
[im 19/36]
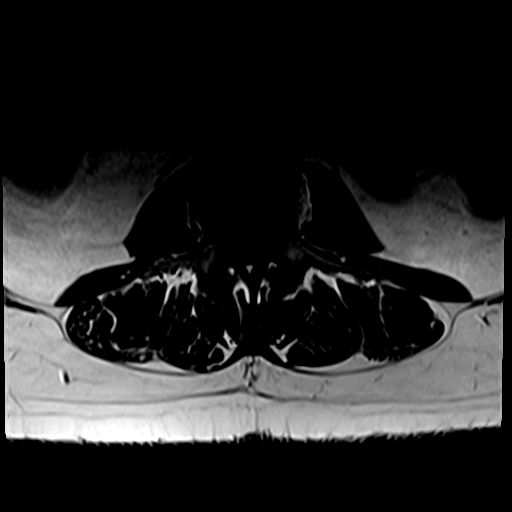
[im 25/36]
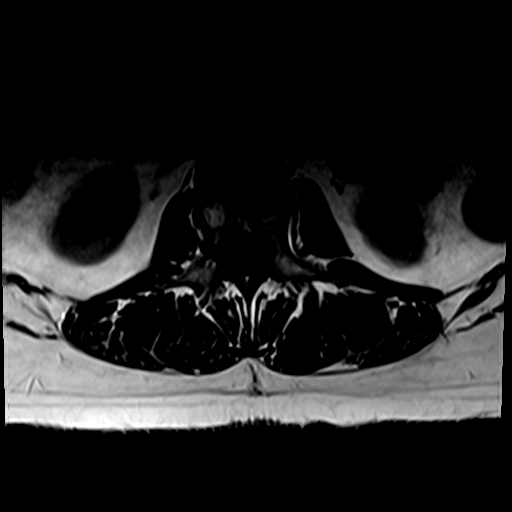
[im 30/36]
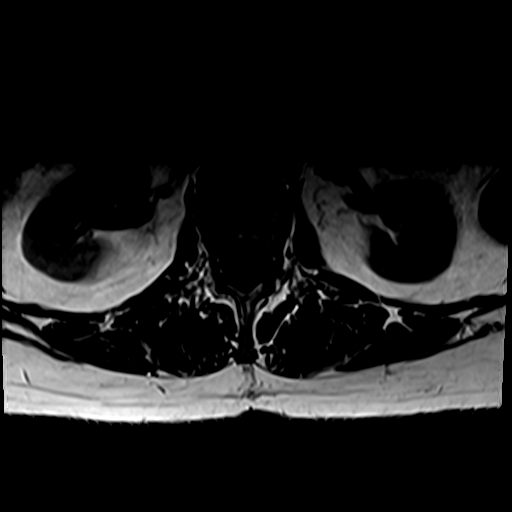
[im 36/36]
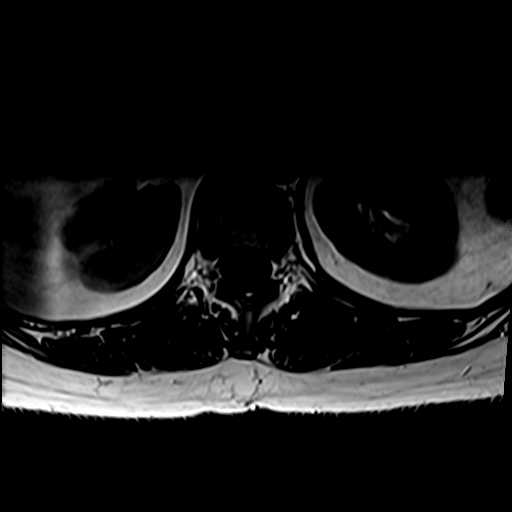

[31 of 48 positions shown; findings below may reference images not displayed]

FINDINGS: Segmentation:  5 lumbar type vertebral bodies.

Alignment:  Normal

Vertebrae: No significant bone finding. Benign appearing hemangioma
within the L2 vertebral body.

Conus medullaris and cauda equina: Conus extends to the T12-L1
level. Conus and cauda equina appear normal.

Paraspinal and other soft tissues: Negative

Disc levels:

T12-L1 and L1-2 are normal.

L2-3: Minimal disc bulge.  No stenosis or neural compression.

L3-4: Mild disc bulge, focally prominent in the left foraminal to
extraforaminal region. No canal stenosis. The left L3 nerve is
adjacent to the disc bulge but does not appear compressed.

L4-5: Mild bulging of the disc. Minimal facet and ligamentous
prominence. No compressive stenosis.

L5-S1: Normal interspace.
IMPRESSION: 1. No acute or traumatic finding.
2. L3-4: Mild disc bulge, focally prominent in the left foraminal to
extraforaminal region. The left L3 nerve is adjacent to the disc
bulge but does not appear compressed.
3. L4-5: Mild disc bulge. Mild facet and ligamentous prominence. No
compressive stenosis.

## 2021-06-09 ENCOUNTER — Other Ambulatory Visit: Payer: Self-pay | Admitting: *Deleted

## 2021-06-09 MED ORDER — BACLOFEN 10 MG PO TABS: 10.0000 mg | ORAL_TABLET | Freq: Three times a day (TID) | ORAL | 1 refills | Status: DC

## 2021-06-22 ENCOUNTER — Other Ambulatory Visit: Payer: Self-pay

## 2021-06-22 ENCOUNTER — Emergency Department
Admission: EM | Admit: 2021-06-22 | Discharge: 2021-06-22 | Disposition: A | Discharge status: Home / Self Care | Payer: Medicare HMO | Attending: Emergency Medicine | Admitting: Emergency Medicine

## 2021-06-22 ENCOUNTER — Emergency Department: Payer: Medicare HMO

## 2021-06-22 DIAGNOSIS — I951 Orthostatic hypotension: Secondary | ICD-10-CM | POA: Diagnosis not present

## 2021-06-22 DIAGNOSIS — R0602 Shortness of breath: Secondary | ICD-10-CM | POA: Diagnosis not present

## 2021-06-22 DIAGNOSIS — R42 Dizziness and giddiness: Secondary | ICD-10-CM

## 2021-06-22 DIAGNOSIS — E860 Dehydration: Secondary | ICD-10-CM | POA: Insufficient documentation

## 2021-06-22 LAB — CBC
HCT: 40.5 % (ref 36.0–46.0)
Hemoglobin: 13.4 g/dL (ref 12.0–15.0)
MCH: 28.9 pg (ref 26.0–34.0)
MCHC: 33.1 g/dL (ref 30.0–36.0)
MCV: 87.5 fL (ref 80.0–100.0)
Platelets: 302 10*3/uL (ref 150–400)
RBC: 4.63 MIL/uL (ref 3.87–5.11)
RDW: 13.2 % (ref 11.5–15.5)
WBC: 10.3 10*3/uL (ref 4.0–10.5)
nRBC: 0 % (ref 0.0–0.2)

## 2021-06-22 LAB — TROPONIN I (HIGH SENSITIVITY)
Troponin I (High Sensitivity): 3 ng/L (ref <18)
Troponin I (High Sensitivity): 2 ng/L (ref <18)

## 2021-06-22 LAB — BASIC METABOLIC PANEL
Anion gap: 7 (ref 5–15)
BUN: 13 mg/dL (ref 6–20)
CO2: 28 mmol/L (ref 22–32)
Calcium: 8.5 mg/dL — ABNORMAL LOW (ref 8.9–10.3)
Chloride: 103 mmol/L (ref 98–111)
Creatinine, Ser: 0.99 mg/dL (ref 0.44–1.00)
GFR, Estimated: >60 mL/min (ref >60)
Glucose, Bld: 95 mg/dL (ref 70–99)
Potassium: 4 mmol/L (ref 3.5–5.1)
Sodium: 138 mmol/L (ref 135–145)

## 2021-06-22 MED ORDER — LACTATED RINGERS IV BOLUS
1000.0000 mL | Freq: Once | INTRAVENOUS | Status: AC
Start: 2021-06-22 — End: 2021-06-22
  Administered 2021-06-22: 1000 mL via INTRAVENOUS

## 2021-06-22 MED ORDER — ACETAMINOPHEN 325 MG PO TABS
650.0000 mg | ORAL_TABLET | Freq: Once | ORAL | Status: AC
  Administered 2021-06-22: 650 mg via ORAL

## 2021-06-22 MED ORDER — ACETAMINOPHEN 325 MG PO TABS
ORAL_TABLET | ORAL | Status: AC
  Filled 2021-06-22: qty 2

## 2021-06-22 NOTE — ED Notes (Signed)
See triage note  presents with several days of dizziness  states had sinus pressure for 1 day  then had diarrhea for 1 day  no fever  states she has been fatigued

## 2021-06-22 NOTE — ED Notes (Signed)
ED Provider at bedside. 

## 2021-06-22 NOTE — Discharge Instructions (Signed)
It is very important to continue drinking enough water, ideally 6-8 glasses per day.  I'd recommend increasing your sodium/salt intake for the next few days for your POTS.  No heavy lifting or exertion.  Be careful when going from sitting to standing.

## 2021-06-22 NOTE — ED Provider Notes (Signed)
[facility] Provider Note    Event Date/Time   First MD Initiated Contact with Patient 06/22/21 [phone]     (approximate)   History   Dizziness   HPI  Melissa [name] is a 51 y.o. female   here with multiple complaints.  The patient states that approximately 2 weeks ago, she had nasal congestion, sore throat, and body aches.  She had some decreased appetite.  She took Sudafed and over-the-counter medications with moderate improvement.  However, she has been generally fatigued and slightly weak since then.  She states that over the last several days, she has felt lightheaded when she stands up.  She has history of POTS as well as syncope.  Denies any chest pain.  She states she has been very weak, fatigued, otherwise denies complaints.  No overt chest pain.  No palpitations.  No recent medication changes.      Physical Exam   Triage Vital Signs: ED Triage Vitals [06/22/21 0729]  Enc Vitals Group     BP 111/83     Pulse Rate 94     Resp 20     Temp 98.1 F (36.7 C)     Temp Source Oral     SpO2 96 %     Weight 155 lb (70.3 kg)     Height 5\' 9"  (1.753 m)     Head Circumference      Peak Flow      Pain Score 6     Pain Loc      Pain Edu?      Excl. in [place]?     Most recent vital signs: Vitals:   06/22/21 0729 06/22/21 1126  BP: 111/83 (!) 109/58  Pulse: 94 78  Resp: 20 18  Temp: 98.1 F (36.7 C) 98.1 F (36.7 C)  SpO2: 96% 96%     General: Awake, no distress.  CV:  Good peripheral perfusion.  Pulses 2+ and symmetric bilateral upper and lower extremities. Resp:  Normal effort.  No wheezes or rales.  Speaking in full sentences. Abd:  No distention.  Nontender Other:  Strength out of 5 bilateral upper and lower extremities.  Cranial nerves grossly intact.  No apparent focal neurological deficits.     ED Results / Procedures / Treatments   Labs (all labs ordered are listed, but only abnormal results are displayed) Labs Reviewed  BASIC  METABOLIC PANEL - Abnormal; Notable for the following components:      Result Value   Calcium 8.5 (*)    All other components within normal limits  CBC  TROPONIN I (HIGH SENSITIVITY)  TROPONIN I (HIGH SENSITIVITY)     EKG  Normal sinus rhythm, ventricular 99.  PR 140, QRS 70, QTc 469.  No acute ST elevations or depressions.  EKG evidence of acute ischemia 4.   RADIOLOGY Chest x-ray: No active disease, reviewed by me.  Agree with radiology interpretation as well.    PROCEDURES:  Critical Care performed: No  Procedures   MEDICATIONS ORDERED IN ED: Medications  lactated ringers bolus 1,000 mL (1,000 mLs Intravenous New Bag/Given 06/22/21 1023)  acetaminophen (TYLENOL) tablet 650 mg (650 mg Oral See Procedure Record 06/22/21 1122)     IMPRESSION / MDM / ASSESSMENT AND PLAN / ED COURSE  I reviewed the triage vital signs and the nursing notes.  Differential diagnosis includes, but is not limited to, POTS, orthostasis, vertigo related to sinusitis, less likely ACS, PE.  The patient is on the cardiac monitor to evaluate for evidence of arrhythmia and/or significant heart rate changes.  51 year old female with past medical history as above including history of IBS, bipolar disorder, POTS, chronic migraines, here with syncopal episode and generalized lightheadedness for the last several days.  The patient had a likely viral sinusitis over the weekend I suspect her symptoms are due to mild dehydration related to this as well as use of Sudafed and Mucinex for her symptoms.  She has a history of syncope and POTS syndrome and her symptoms are highly consistent with this.  She otherwise is hemodynamically very stable.  She has no lower extremity swelling, tachycardia, tachypnea, hypoxia, or signs to suggest PE.  EKG is nonischemic and troponins are negative x2, do not suspect ACS.  CBC shows no evidence of anemia.  No leukocytosis noted.  BMP shows normal renal  function and electrolytes.  Chest x-ray unremarkable on my review, no evidence of pneumonia, pneumothorax, or alternative etiology.  Given reassuring exam and lab work with history of similar symptoms in the setting of dehydration and recent sinusitis type illness, patient was given fluids and feels better.  She is amatory in the ED without difficulty.  No apparent acute emergent pathology.  Will discharge with encouraged hydration at home and good return precautions.     FINAL CLINICAL IMPRESSION(S) / ED DIAGNOSES   Final diagnoses:  Dizziness  Dehydration     Rx / DC Orders   ED Discharge Orders     None        Note:  This document was prepared using Dragon voice recognition software and may include unintentional dictation errors.   Duffy Bruce, MD 06/22/21 [phone]

## 2021-06-22 NOTE — ED Triage Notes (Signed)
Pt to ED for dizziness, fatigue, headache, bodyaches, syncope this am. Did not hit head with syncope. Hx POTS Sinus drainage last week.  Shob over the past week with exertion

## 2021-07-02 DIAGNOSIS — F3162 Bipolar disorder, current episode mixed, moderate: Secondary | ICD-10-CM | POA: Diagnosis not present

## 2021-07-02 DIAGNOSIS — G478 Other sleep disorders: Secondary | ICD-10-CM | POA: Diagnosis not present

## 2021-07-02 DIAGNOSIS — F411 Generalized anxiety disorder: Secondary | ICD-10-CM | POA: Diagnosis not present

## 2021-07-02 DIAGNOSIS — Z79899 Other long term (current) drug therapy: Secondary | ICD-10-CM | POA: Diagnosis not present

## 2021-07-07 DIAGNOSIS — F3162 Bipolar disorder, current episode mixed, moderate: Secondary | ICD-10-CM | POA: Diagnosis not present

## 2021-07-24 ENCOUNTER — Other Ambulatory Visit: Payer: Self-pay | Admitting: Internal Medicine

## 2021-07-30 DIAGNOSIS — F411 Generalized anxiety disorder: Secondary | ICD-10-CM | POA: Diagnosis not present

## 2021-07-30 DIAGNOSIS — G478 Other sleep disorders: Secondary | ICD-10-CM | POA: Diagnosis not present

## 2021-07-30 DIAGNOSIS — F3162 Bipolar disorder, current episode mixed, moderate: Secondary | ICD-10-CM | POA: Diagnosis not present

## 2021-07-30 DIAGNOSIS — R5382 Chronic fatigue, unspecified: Secondary | ICD-10-CM | POA: Diagnosis not present

## 2021-07-30 DIAGNOSIS — F900 Attention-deficit hyperactivity disorder, predominantly inattentive type: Secondary | ICD-10-CM | POA: Diagnosis not present

## 2021-07-30 DIAGNOSIS — Z79899 Other long term (current) drug therapy: Secondary | ICD-10-CM | POA: Diagnosis not present

## 2021-08-17 NOTE — Progress Notes (Signed)
[facility]    Provider:  Dr Jaynee Eagles Requesting Provider: Cletis Athens, MD Primary Care Provider:  Cletis Athens, MD  CC:  Migraine   August 17, 2021: Patient here for follow-up of migraines, was doing great on Ajovy 6 months ago, acutely using Maxalt and baclofen or Flexeril.  She did great on Emgality but she could not afford it so we gave her Ajovy samples at last appointment, here for follow-up. She is having 5-6 migraine days a month. 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 A total of 5 migraine days a month and 5 total headache days a month. Her baseline migraine frequency was Lots of stress. She takes amerge and ondansetron acutely and helps a lot, may still last for hours but much better. We will try to prescribe the Ajovy and see if insurance will pay for it. At baseline she had 12 migraine days a month and > 15 headache days a month and has done tremendously well on Ajovy.   Patient complains of symptoms per HPI as well as the following symptoms: stress . Pertinent negatives and positives per HPI. All others negative   02/16/2021: DOing great on Ajovy. Only 6 migraines in 6 months. Takes maxalt and baclofen acutely or flexeril, but when she gets a migraine it is hard to get rid of, the last one was 3 days, even though frequency is greatly improved acute management is still a problem. She did well on Emgality bu it is $85 and she can't afford it but Ajovy is better. She had covid in the meantime. We will give her 6 months of samples of Ajovy. She only had one migraine, we discussed aimovig but she is having hair loss due to covid so gave ajovy.     Acutely: Tried maxalt, zolmitriptan, ondansetron helps significantly for the nausea, sumatriptan,   HPI:  Melissa [name] is a 51 y.o. female here as requested by Cletis Athens, MD for migraines. PMHx POTS, panic attack, migraine, fibromyalgia, bipolar, arthritis, HTN, IBS, depression, B12 deficiency, tremor, anxiety. She is having  stress with her mother who is 31 and refuses to leave her home, so there is stress affecting. Been doing fine, restarted 3-4 months ago in the setting of stress, unilateral, mostly sound sensitivity and mild light sensitivity, movement makes it worse, nausea, no vomiting, getting a migraine every week now, can last 2-3 days, up to 12 migraine days a month for the last 3 months and pulsating/pounding, starts in the left in the back and radiates to the front, can be severe, a quiet room helps, OTC has not helped, hydrocodone has not helped significantly, had to go to the walk-in clinic recently, can affect her life and work. These are similar to prior migraines, vision is affected with the severe migraines, no new sensory changes or new vision changes, same quality and severity just more frequent, they can happen any time of the day. Not positional or exertional or anything of concern ut is the same exact migraine as always. No other focal neurologic deficits, associated symptoms, inciting events or modifiable factors.  Reviewed notes, labs and imaging from outside physicians, which showed:  Cbc/bmp unremarkable 06/26/2019  CT head 08/2018 showed No acute intracranial abnormalities including mass lesion or mass effect, hydrocephalus, extra-axial fluid collection, midline shift, hemorrhage, or acute infarction, large ischemic events (personally reviewed images)  Medications that patient has use that can be used in migraine management include: Tylenol, Elavil, Wellbutrin, Tegretol, Flexeril, diclofenac tablet, Benadryl, Depakote, Prozac, gabapentin, Toradol injections,  meclizine, Robaxin, Reglan injections, Zofran injections and tablets, prednisone tablets, Phenergan tablets and suppositories, Maxalt, Imitrex tablets and injections, tizanidine, tramadol, Effexor, verapamil, topamax, propranolol contraindicated due to POTS.    Review of Systems: Patient complains of symptoms per HPI as well as the following  symptoms: hnausea. Pertinent negatives and positives per HPI. All others negative    Social History   Socioeconomic History   Marital status: Married    Spouse name: Izell Greenback   Number of children: 0   Years of education: some college   Highest education level: Not on file  Occupational History    Employer: OTHER    Comment: disabled  Tobacco Use   Smoking status: Never   Smokeless tobacco: Never  Vaping Use   Vaping Use: Never used  Substance and Sexual Activity   Alcohol use: No    Alcohol/week: 0.0 standard drinks   Drug use: No   Sexual activity: Yes    Birth control/protection: Surgical    Comment: also on OCPs  Other Topics Concern   Not on file  Social History Narrative   08/28/19   From: [place], moved because her husband was from [place]   Living: with husband Izell [place]ubon, since 1990   Work: disability due to fibromyalgia and bipolar disorder      Family: mother in law is nearby, her mother is in [place]; has a sister      Enjoys: crochet, baking, reading      Exercise: walking the dog, yoga - tries to do 3 times a week   Diet: not great, more sweets than she should      Safety   Seat belts: Yes    Guns: Yes  and secure   Safe in relationships: Yes          Right handed   Caffeine: none    Social Determinants of Health   Financial Resource Strain: Not on file  Food Insecurity: Not on file  Transportation Needs: Not on file  Physical Activity: Not on file  Stress: Not on file  Social Connections: Not on file  Intimate Partner Violence: Not on file    Family History  Problem Relation Age of Onset   Diabetes Mother    Hypertension Mother    Brain cancer Father    Ovarian cancer Maternal Grandmother        metastasized   Lung cancer Maternal Grandfather    Cancer Paternal Grandmother        not sure of the type   Cancer Paternal Grandfather        not sure of the type   Migraines Neg Hx     Past Medical History:  Diagnosis Date   Arthritis     Bipolar disorder ([place])    Fibromyalgia    on disability   Migraine    Muscle spasm    Pain of right side of body    chronic   Panic attack    POTS (postural orthostatic tachycardia syndrome)    Syncope    recurrent   Uterine perforation    hx of    Patient Active Problem List   Diagnosis Date Noted   Myalgia 03/03/2021   Radiculopathy affecting upper extremity 11/14/2020   Thrombocytosis 03/09/2020   Hyperglycemia 03/09/2020   Pneumonia due to COVID-19 virus 03/03/2020   Depression with anxiety 03/03/2020   Sepsis ([place]) 03/03/2020   Acute respiratory failure with hypoxia (HCC)    Chronic migraine without aura without status migrainosus,  not intractable 02/11/2020   Anxiety 10/23/2019   Dizziness 07/02/2019   Tremor of both hands 03/27/2018   Syncopal episodes 09/10/2017   Essential hypertension 05/09/2017   Menorrhagia 11/11/2016   Alopecia 03/02/2016   Bilateral cataracts 08/14/2015   Acne 10/21/2014   Liver lesion 09/09/2014   Vitamin D deficiency 09/09/2014   Hot flashes 06/10/2014   Chronic pain syndrome 05/20/2014   Vitamin B12 deficiency 03/12/2014   Contraception management 09/20/2013   IBS (irritable bowel syndrome) 09/20/2013   Unspecified vitamin D deficiency 04/03/2013   Dyslipidemia 04/03/2013   Osteopenia 04/03/2013   POTS (postural orthostatic tachycardia syndrome) 02/26/2013   Migraine 11/29/2012   Allergic rhinitis 10/05/2012   Mixed bipolar I disorder in remission ([place]) 10/05/2012   Combined fat and carbohydrate induced hyperlipemia 10/05/2012   Fibromyalgia    Bipolar disorder ([place])    Fatigue 09/10/2011   Clinical depression 09/10/2011   Gastroesophageal reflux disease without esophagitis 09/10/2011   Non-restorative sleep 09/10/2011   Disturbance in sleep behavior 09/10/2011    Past Surgical History:  Procedure Laterality Date   CATARACT EXTRACTION  dec 2014   DILATION AND CURETTAGE OF UTERUS     LASIK  dec 2014    Current  Outpatient Medications  Medication Sig Dispense Refill   acetaminophen (TYLENOL) 500 MG tablet Take 1,000 mg by mouth daily as needed for mild pain.     baclofen (LIORESAL) 10 MG tablet Take 1 tablet (10 mg total) by mouth 3 (three) times daily. 90 each 1   buPROPion (WELLBUTRIN XL) 150 MG 24 hr tablet Take 1 tablet (150 mg total) by mouth daily. 90 tablet 3   clonazePAM (KLONOPIN) 1 MG tablet Take 1 tablet (1 mg total) by mouth 2 (two) times daily as needed. for anxiety 60 tablet 0   cyclobenzaprine (FLEXERIL) 10 MG tablet TAKE 1 TABLET BY MOUTH 3 TIMES A DAY AS NEEDED FOR MUSCLE SPASM 90 tablet 0   fluvoxaMINE (LUVOX) 50 MG tablet Take 50-100 mg by mouth at bedtime.      Fremanezumab-vfrm (AJOVY) 225 MG/1.5ML SOAJ Inject 225 mg into the skin every 30 (thirty) days. 1.5 mL 11   gabapentin (NEURONTIN) 600 MG tablet TAKE 1 TABLET BY MOUTH 3 TIMES DAILY 90 tablet 3   norgestimate-ethinyl estradiol (SPRINTEC 28) 0.25-35 MG-MCG tablet Take 1 tablet by mouth daily. 6 Package 1   verapamil (CALAN-SR) 120 MG CR tablet TAKE ONE TABLET BY MOUTH AT BEDTIME 90 tablet 3   naratriptan (AMERGE) 2.5 MG tablet Take 1 tablet (2.5 mg total) by mouth as needed for migraine. Take one (1) tablet at onset of headache; if returns or does not resolve, may repeat after 4 hours; do not exceed five (5) mg in 24 hours. 9 tablet 11   ondansetron (ZOFRAN-ODT) 8 MG disintegrating tablet Take 0.5 tablets (4 mg total) by mouth every 8 (eight) hours as needed for nausea or vomiting. 30 tablet 11   No current facility-administered medications for this visit.    Allergies as of 08/18/2021 - Review Complete 08/18/2021  Allergen Reaction Noted   Lamotrigine Hives and Swelling 06/03/2015   Abilify [aripiprazole] Other (See Comments) 07/17/2015   Azithromycin Rash 09/12/2012   Depakote [divalproex sodium] Other (See Comments) 11/28/2012   Erythromycin base Swelling 01/03/2015   Lithium Rash 11/28/2012   Penicillins Rash  09/12/2012   Pregabalin Palpitations 11/23/2013   Seroquel [quetiapine fumarate] Other (See Comments) 11/28/2012   Sulfa antibiotics Rash 09/12/2012   Tetanus toxoids Other (See Comments)  06/03/2015   Tramadol Nausea And Vomiting 03/12/2015   Ziprasidone hcl Other (See Comments) 01/03/2015    Vitals: BP 116/72    Pulse 98    Ht 5\' 9"  (1.753 m)    Wt 158 lb (71.7 kg)    BMI 23.33 kg/m  Last Weight:  Wt Readings from Last 1 Encounters:  08/18/21 158 lb (71.7 kg)   Last Height:   Ht Readings from Last 1 Encounters:  08/18/21 5\' 9"  (1.753 m)  Exam: NAD, pleasant                  Speech:    Speech is normal; fluent and spontaneous with normal comprehension.  Cognition:    The patient is oriented to person, place, and time;     recent and remote memory intact;     language fluent;    Cranial Nerves:    The pupils are equal, round, and reactive to light.Trigeminal sensation is intact and the muscles of mastication are normal. The face is symmetric. The palate elevates in the midline. Hearing intact. Voice is normal. Shoulder shrug is normal. The tongue has normal motion without fasciculations.   Coordination:  No dysmetria  Motor Observation:    No asymmetry, no atrophy, and no involuntary movements noted. Tone:    Normal muscle tone.     Strength:    Strength is V/V in the upper and lower limbs.      Sensation: intact to LT     Assessment/Plan:  51 year old with chronic migraines recently worsening in the setting of stress. She has tried and failed multiple medications, we discussed options.   Continue Ajovy, discussed their foundation and gave her instructions on how to apply At inset: Zofran, amerge: Please take one tablet at the onset of your headache. If it does not improve the symptoms please take one additional tablet. Do not take more then 2 tablets in 24hrs. Do not take use more then 2 to 3 times in a week. May also take muscle relaxer and ibuprofen/tylenol at  onset  She did ok on Emgality but it is $85 and she can't afford it not as effective, Ajovy much better. She had covid in the meantime. Aimovig contraindicated due to constipation. Tried Topramate(side effects confusion), propranolol(side effects hypotension), amitriptyline/nortriptyline(sedaton), depakote, imitrex, relpax, can't take blood pressure mes due to POTS ad already on verapamil (Prior Medications that patient has use that can be used in migraine management include: Tylenol, Elavil, Wellbutrin, Tegretol, Flexeril, diclofenac tablet, Benadryl, Depakote, Prozac, gabapentin, Toradol injections, meclizine, Robaxin, Reglan injections, Zofran injections and tablets, prednisone tablets, Phenergan tablets and suppositories, Maxalt, Imitrex tablets and injections, tizanidine, tramadol, Effexor, verapamil, topamax, propranolol contraindicated due to POTS.)   To prevent or relieve headaches, try the following: Cool Compress. Lie down and place a cool compress on your head.  Avoid headache triggers. If certain foods or odors seem to have triggered your migraines in the past, avoid them. A headache diary might help you identify triggers.  Include physical activity in your daily routine. Try a daily walk or other moderate aerobic exercise.  Manage stress. Find healthy ways to cope with the stressors, such as delegating tasks on your to-do list.  Practice relaxation techniques. Try deep breathing, yoga, massage and visualization.  Eat regularly. Eating regularly scheduled meals and maintaining a healthy diet might help prevent headaches. Also, drink plenty of fluids.  Follow a regular sleep schedule. Sleep deprivation might contribute to headaches Consider biofeedback. With this  mind-body technique, you learn to control certain bodily functions -- such as muscle tension, heart rate and blood pressure -- to prevent headaches or reduce headache pain.    Proceed to emergency room if you experience new or  worsening symptoms or symptoms do not resolve, if you have new neurologic symptoms or if headache is severe, or for any concerning symptom.    Meds ordered this encounter  Medications   Fremanezumab-vfrm (AJOVY) 225 MG/1.5ML SOAJ    Sig: Inject 225 mg into the skin every 30 (thirty) days.    Dispense:  1.5 mL    Refill:  11    Patient has copay card; she can have medication regardless of insurance approval or copay amount.   naratriptan (AMERGE) 2.5 MG tablet    Sig: Take 1 tablet (2.5 mg total) by mouth as needed for migraine. Take one (1) tablet at onset of headache; if returns or does not resolve, may repeat after 4 hours; do not exceed five (5) mg in 24 hours.    Dispense:  9 tablet    Refill:  11   ondansetron (ZOFRAN-ODT) 8 MG disintegrating tablet    Sig: Take 0.5 tablets (4 mg total) by mouth every 8 (eight) hours as needed for nausea or vomiting.    Dispense:  30 tablet    Refill:  11    Cancel ondansetron 4 mg Rx     Cc: Cletis Athens, MD,  Cletis Athens, MD   I spent over 20 minutes of face-to-face and non-face-to-face time with patient on the  1. Chronic migraine without aura without status migrainosus, not intractable     diagnosis.  This included previsit chart review, lab review, study review, order entry, electronic health record documentation, patient education on the different diagnostic and therapeutic options, counseling and coordination of care, risks and benefits of management, compliance, or risk factor reduction    Sarina Ill, MD  [facility] [address] [place], [place] [address]  Phone [phone] Fax [phone]

## 2021-08-18 ENCOUNTER — Encounter: Payer: Self-pay | Admitting: Neurology

## 2021-08-18 ENCOUNTER — Ambulatory Visit: Payer: Medicare HMO | Admitting: Neurology

## 2021-08-18 VITALS — BP 116/72 | HR 98 | Ht 69.0 in | Wt 158.0 lb

## 2021-08-18 DIAGNOSIS — G43709 Chronic migraine without aura, not intractable, without status migrainosus: Secondary | ICD-10-CM

## 2021-08-18 MED ORDER — NARATRIPTAN HCL 2.5 MG PO TABS: 2.5000 mg | ORAL_TABLET | ORAL | 11 refills | Status: DC | PRN

## 2021-08-18 MED ORDER — AJOVY 225 MG/1.5ML ~~LOC~~ SOAJ: 225.0000 mg | SUBCUTANEOUS | 11 refills | Status: DC

## 2021-08-18 MED ORDER — ONDANSETRON 8 MG PO TBDP: 4.0000 mg | ORAL_TABLET | Freq: Three times a day (TID) | ORAL | 11 refills | Status: AC | PRN

## 2021-08-27 DIAGNOSIS — R5382 Chronic fatigue, unspecified: Secondary | ICD-10-CM | POA: Diagnosis not present

## 2021-08-27 DIAGNOSIS — F3162 Bipolar disorder, current episode mixed, moderate: Secondary | ICD-10-CM | POA: Diagnosis not present

## 2021-08-27 DIAGNOSIS — Z79899 Other long term (current) drug therapy: Secondary | ICD-10-CM | POA: Diagnosis not present

## 2021-08-27 DIAGNOSIS — F900 Attention-deficit hyperactivity disorder, predominantly inattentive type: Secondary | ICD-10-CM | POA: Diagnosis not present

## 2021-08-27 DIAGNOSIS — G478 Other sleep disorders: Secondary | ICD-10-CM | POA: Diagnosis not present

## 2021-08-27 DIAGNOSIS — F411 Generalized anxiety disorder: Secondary | ICD-10-CM | POA: Diagnosis not present

## 2021-09-03 ENCOUNTER — Telehealth: Payer: Self-pay | Admitting: *Deleted

## 2021-09-03 NOTE — Telephone Encounter (Signed)
Ajovy PA on Cover My Meds. Key: [id number]. ? ?Approved today ?PA Case: [id number], Status: Approved, Coverage Starts on: 06/21/2021 12:00:00 AM, Coverage Ends on: 06/20/2022 12:00:00 AM. Questions? Contact [phone]. ?  ?Received pt's portion of Ajovy assistance application from patient via fax. Physician portion (prescription) completed, signed, and faxed to [employer] along with [facility] approval letter and copy of pt's insurance card. Received a receipt of confirmation. ?Updated pt via mychart.  ?

## 2021-09-15 ENCOUNTER — Other Ambulatory Visit: Payer: Self-pay | Admitting: Internal Medicine

## 2021-09-17 ENCOUNTER — Encounter: Payer: Self-pay | Admitting: Nurse Practitioner

## 2021-09-17 ENCOUNTER — Ambulatory Visit (INDEPENDENT_AMBULATORY_CARE_PROVIDER_SITE_OTHER): Payer: Medicare HMO | Admitting: Nurse Practitioner

## 2021-09-17 VITALS — BP 118/70 | HR 76 | Ht 69.0 in | Wt 154.5 lb

## 2021-09-17 DIAGNOSIS — R5383 Other fatigue: Secondary | ICD-10-CM

## 2021-09-17 DIAGNOSIS — K219 Gastro-esophageal reflux disease without esophagitis: Secondary | ICD-10-CM

## 2021-09-17 DIAGNOSIS — F419 Anxiety disorder, unspecified: Secondary | ICD-10-CM | POA: Diagnosis not present

## 2021-09-17 MED ORDER — OMEPRAZOLE 40 MG PO CPDR: 40.0000 mg | DELAYED_RELEASE_CAPSULE | Freq: Every day | ORAL | 3 refills | Status: DC

## 2021-09-17 NOTE — Progress Notes (Signed)
? ?Established Patient Office Visit ? ?Subjective:  ?Patient ID: Melissa [name], female    DOB: [birth year]  Age: 51 y.o. MRN: [id number] ? ?CC:  ?Chief Complaint  ?Patient presents with  ? Gastroesophageal Reflux  ?  Patient states that she has been taking otc anti acid but now it no longer helps. She feels that the hear burn is much worse and now come on after food or drink.   ? Sore Throat  ?  Patient has had sore throat x 2 days   ? ? ? ?HPI ? ?Melissa [name] presents for: ? ?Gastroesophageal Reflux ?She complains of a sore throat. She reports no chest pain or no coughing. Associated symptoms include fatigue.  ?Sore Throat  ?Pertinent negatives include no congestion, coughing, diarrhea, ear discharge, headaches or shortness of breath.   ? ?Past Medical History:  ?Diagnosis Date  ? Arthritis   ? Bipolar disorder ([place])   ? Fibromyalgia   ? on disability  ? Migraine   ? Muscle spasm   ? Pain of right side of body   ? chronic  ? Panic attack   ? POTS (postural orthostatic tachycardia syndrome)   ? Syncope   ? recurrent  ? Uterine perforation   ? hx of  ? ? ?Past Surgical History:  ?Procedure Laterality Date  ? CATARACT EXTRACTION  dec 2014  ? DILATION AND CURETTAGE OF UTERUS    ? LASIK  dec 2014  ? ? ?Family History  ?Problem Relation Age of Onset  ? Diabetes Mother   ? Hypertension Mother   ? Brain cancer Father   ? Ovarian cancer Maternal Grandmother   ?     metastasized  ? Lung cancer Maternal Grandfather   ? Cancer Paternal Grandmother   ?     not sure of the type  ? Cancer Paternal Grandfather   ?     not sure of the type  ? Migraines Neg Hx   ? ? ?Social History  ? ?Socioeconomic History  ? Marital status: Married  ?  Spouse name: Izell [place]  ? Number of children: 0  ? Years of education: some college  ? Highest education level: Not on file  ?Occupational History  ?  Employer: OTHER  ?  Comment: disabled  ?Tobacco Use  ? Smoking status: Never  ? Smokeless tobacco: Never  ?Vaping Use  ? Vaping Use: Never used   ?Substance and Sexual Activity  ? Alcohol use: No  ?  Alcohol/week: 0.0 standard drinks  ? Drug use: No  ? Sexual activity: Yes  ?  Birth control/protection: Surgical  ?  Comment: also on OCPs  ?Other Topics Concern  ? Not on file  ?Social History Narrative  ? 08/28/19  ? From: [place], moved because her husband was from [place]  ? Living: with husband Izell , since 1990  ? Work: disability due to fibromyalgia and bipolar disorder  ?   ? Family: mother in law is nearby, her mother is in [place]; has a sister  ?   ? Enjoys: crochet, baking, reading  ?   ? Exercise: walking the dog, yoga - tries to do 3 times a week  ? Diet: not great, more sweets than she should  ?   ? Safety  ? Seat belts: Yes   ? Guns: Yes  and secure  ? Safe in relationships: Yes   ?   ?   ? Right handed  ? Caffeine: none   ? ?Social  Determinants of Health  ? ?Financial Resource Strain: Not on file  ?Food Insecurity: Not on file  ?Transportation Needs: Not on file  ?Physical Activity: Not on file  ?Stress: Not on file  ?Social Connections: Not on file  ?Intimate Partner Violence: Not on file  ? ? ? ?Outpatient Medications Prior to Visit  ?Medication Sig Dispense Refill  ? acetaminophen (TYLENOL) 500 MG tablet Take 1,000 mg by mouth daily as needed for mild pain.    ? baclofen (LIORESAL) 10 MG tablet Take 1 tablet (10 mg total) by mouth 3 (three) times daily. 90 each 1  ? buPROPion (WELLBUTRIN XL) 150 MG 24 hr tablet Take 1 tablet (150 mg total) by mouth daily. 90 tablet 3  ? clonazePAM (KLONOPIN) 1 MG tablet Take 1 tablet (1 mg total) by mouth 2 (two) times daily as needed. for anxiety 60 tablet 0  ? cyclobenzaprine (FLEXERIL) 10 MG tablet TAKE 1 TABLET BY MOUTH 3 TIMES A DAY AS NEEDED FOR MUSCLE SPASM 90 tablet 0  ? fluvoxaMINE (LUVOX) 50 MG tablet Take 50-100 mg by mouth at bedtime.     ? Fremanezumab-vfrm (AJOVY) 225 MG/1.5ML SOAJ Inject 225 mg into the skin every 30 (thirty) days. 1.5 mL 11  ? gabapentin (NEURONTIN) 600 MG tablet TAKE 1 TABLET BY  MOUTH 3 TIMES DAILY 90 tablet 3  ? naratriptan (AMERGE) 2.5 MG tablet Take 1 tablet (2.5 mg total) by mouth as needed for migraine. Take one (1) tablet at onset of headache; if returns or does not resolve, may repeat after 4 hours; do not exceed five (5) mg in 24 hours. 9 tablet 11  ? norgestimate-ethinyl estradiol (SPRINTEC 28) 0.25-35 MG-MCG tablet Take 1 tablet by mouth daily. 6 Package 1  ? ondansetron (ZOFRAN-ODT) 8 MG disintegrating tablet Take 0.5 tablets (4 mg total) by mouth every 8 (eight) hours as needed for nausea or vomiting. 30 tablet 11  ? verapamil (CALAN-SR) 120 MG CR tablet TAKE 1 TABLET BY MOUTH AT BEDTIME 90 tablet 3  ? ?No facility-administered medications prior to visit.  ? ? ?Allergies  ?Allergen Reactions  ? Lamotrigine Hives and Swelling  ? Abilify [Aripiprazole] Other (See Comments)  ?  Weight gain, did not help,   ? Azithromycin Rash  ? Depakote [Divalproex Sodium] Other (See Comments)  ?  Hair loss  ? Erythromycin Base Swelling  ? Lithium Rash  ? Penicillins Rash  ?  Has patient had a PCN reaction causing immediate rash, facial/tongue/throat swelling, SOB or lightheadedness with hypotension: Yes- swelling, rash  ?Has patient had a PCN reaction causing severe rash involving mucus membranes or skin necrosis: Yes ?Has patient had a PCN reaction that required hospitalization No ?Has patient had a PCN reaction occurring within the last 10 years: No ?If all of the above answers are "NO", then may proceed with Cephalosporin use. ?  ? Pregabalin Palpitations  ?  Manic symptoms, no sleep x's 3 days, no appetite.  ? Seroquel [Quetiapine Fumarate] Other (See Comments)  ?  Irritable  ? Sulfa Antibiotics Rash  ? Tetanus Toxoids Other (See Comments)  ?  Swelling to injection site  ? Tramadol Nausea And Vomiting  ? Ziprasidone Hcl Other (See Comments)  ?  Heat/cold intolerance.   ? ? ?ROS ?Review of Systems  ?Constitutional:  Positive for fatigue. Negative for activity change and appetite change.   ?HENT:  Positive for sore throat. Negative for congestion and ear discharge.   ?Respiratory:  Positive for chest tightness. Negative for  cough and shortness of breath.   ?Cardiovascular:  Negative for chest pain and palpitations.  ?Gastrointestinal:  Negative for abdominal distention, constipation and diarrhea.  ?Genitourinary: Negative.  Negative for difficulty urinating, enuresis and pelvic pain.  ?Musculoskeletal:  Positive for myalgias.  ?Neurological:  Negative for dizziness, light-headedness, numbness and headaches.  ?Psychiatric/Behavioral:  Negative for agitation, behavioral problems and confusion.   ? ?  ?Objective:  ?  ?Physical Exam ?Constitutional:   ?   Appearance: She is well-developed and normal weight.  ?HENT:  ?   Head: Normocephalic.  ?   Right Ear: Tympanic membrane normal.  ?   Left Ear: Tympanic membrane normal.  ?   Nose: No congestion or rhinorrhea.  ?   Mouth/Throat:  ?   Mouth: Mucous membranes are moist.  ?   Pharynx: Oropharynx is clear. No posterior oropharyngeal erythema or uvula swelling.  ?   Tonsils: No tonsillar exudate.  ?Eyes:  ?   Extraocular Movements: Extraocular movements intact.  ?   Conjunctiva/sclera: Conjunctivae normal.  ?   Pupils: Pupils are equal, round, and reactive to light.  ?Cardiovascular:  ?   Rate and Rhythm: Normal rate and regular rhythm.  ?   Pulses: Normal pulses.  ?   Heart sounds: Normal heart sounds.  ?Pulmonary:  ?   Effort: Pulmonary effort is normal.  ?   Breath sounds: Normal breath sounds.  ?Abdominal:  ?   General: Abdomen is flat. Bowel sounds are normal.  ?   Palpations: Abdomen is soft.  ?Musculoskeletal:     ?   General: Normal range of motion.  ?   Cervical back: Normal range of motion.  ?Skin: ?   General: Skin is warm.  ?   Capillary Refill: Capillary refill takes less than 2 seconds.  ?Neurological:  ?   General: No focal deficit present.  ?   Mental Status: She is alert and oriented to person, place, and time. Mental status is at baseline.   ?Psychiatric:     ?   Mood and Affect: Mood normal.     ?   Behavior: Behavior normal.     ?   Thought Content: Thought content normal.     ?   Judgment: Judgment normal.  ? ? ?BP 118/70   Pulse 76   Ht '5\' 9"'$  (1.753 m)

## 2021-09-17 NOTE — Assessment & Plan Note (Signed)
Patient complains of unusual fatigue. ?Would check TSH and CBC. ? ?

## 2021-09-17 NOTE — Assessment & Plan Note (Signed)
Stable on medication

## 2021-09-17 NOTE — Assessment & Plan Note (Signed)
Started patient on omprazole 40 mg once daily before breakfast. ?Advise her to eat dinner at least 2-3 hours before going to bed or lying down.  ?Will continue to monitor. ? ?

## 2021-09-18 LAB — CBC WITH DIFFERENTIAL/PLATELET
Absolute Monocytes: 781 cells/uL (ref 200–950)
Basophils Absolute: 112 cells/uL (ref 0–200)
Basophils Relative: 1.2 %
Eosinophils Absolute: 102 cells/uL (ref 15–500)
Eosinophils Relative: 1.1 %
HCT: 38.5 % (ref 35.0–45.0)
Hemoglobin: 13 g/dL (ref 11.7–15.5)
Lymphs Abs: 1274 cells/uL (ref 850–3900)
MCH: 29.3 pg (ref 27.0–33.0)
MCHC: 33.8 g/dL (ref 32.0–36.0)
MCV: 86.7 fL (ref 80.0–100.0)
MPV: 11.6 fL (ref 7.5–12.5)
Monocytes Relative: 8.4 %
Neutro Abs: 7031 cells/uL (ref 1500–7800)
Neutrophils Relative %: 75.6 %
Platelets: 275 10*3/uL (ref 140–400)
RBC: 4.44 10*6/uL (ref 3.80–5.10)
RDW: 12.5 % (ref 11.0–15.0)
Total Lymphocyte: 13.7 %
WBC: 9.3 10*3/uL (ref 3.8–10.8)

## 2021-09-18 LAB — TSH: TSH: 0.89 mIU/L

## 2021-09-21 DIAGNOSIS — J329 Chronic sinusitis, unspecified: Secondary | ICD-10-CM | POA: Diagnosis not present

## 2021-09-23 ENCOUNTER — Ambulatory Visit: Payer: Medicare HMO | Admitting: Internal Medicine

## 2021-09-25 DIAGNOSIS — F900 Attention-deficit hyperactivity disorder, predominantly inattentive type: Secondary | ICD-10-CM | POA: Diagnosis not present

## 2021-09-25 DIAGNOSIS — F3162 Bipolar disorder, current episode mixed, moderate: Secondary | ICD-10-CM | POA: Diagnosis not present

## 2021-09-25 DIAGNOSIS — G478 Other sleep disorders: Secondary | ICD-10-CM | POA: Diagnosis not present

## 2021-09-25 DIAGNOSIS — R5382 Chronic fatigue, unspecified: Secondary | ICD-10-CM | POA: Diagnosis not present

## 2021-09-25 DIAGNOSIS — Z79899 Other long term (current) drug therapy: Secondary | ICD-10-CM | POA: Diagnosis not present

## 2021-09-25 DIAGNOSIS — F411 Generalized anxiety disorder: Secondary | ICD-10-CM | POA: Diagnosis not present

## 2021-10-12 DIAGNOSIS — Z1231 Encounter for screening mammogram for malignant neoplasm of breast: Secondary | ICD-10-CM | POA: Diagnosis not present

## 2021-10-12 DIAGNOSIS — Z124 Encounter for screening for malignant neoplasm of cervix: Secondary | ICD-10-CM | POA: Diagnosis not present

## 2021-10-12 DIAGNOSIS — Z01411 Encounter for gynecological examination (general) (routine) with abnormal findings: Secondary | ICD-10-CM | POA: Diagnosis not present

## 2021-10-12 DIAGNOSIS — Z01419 Encounter for gynecological examination (general) (routine) without abnormal findings: Secondary | ICD-10-CM | POA: Diagnosis not present

## 2021-10-12 DIAGNOSIS — Z0142 Encounter for cervical smear to confirm findings of recent normal smear following initial abnormal smear: Secondary | ICD-10-CM | POA: Diagnosis not present

## 2021-10-12 DIAGNOSIS — Z6823 Body mass index (BMI) 23.0-23.9, adult: Secondary | ICD-10-CM | POA: Diagnosis not present

## 2021-10-12 DIAGNOSIS — Z3041 Encounter for surveillance of contraceptive pills: Secondary | ICD-10-CM | POA: Diagnosis not present

## 2021-10-12 LAB — HM MAMMOGRAPHY

## 2021-10-23 DIAGNOSIS — Z79899 Other long term (current) drug therapy: Secondary | ICD-10-CM | POA: Diagnosis not present

## 2021-10-23 DIAGNOSIS — G478 Other sleep disorders: Secondary | ICD-10-CM | POA: Diagnosis not present

## 2021-10-23 DIAGNOSIS — F900 Attention-deficit hyperactivity disorder, predominantly inattentive type: Secondary | ICD-10-CM | POA: Diagnosis not present

## 2021-10-23 DIAGNOSIS — F3162 Bipolar disorder, current episode mixed, moderate: Secondary | ICD-10-CM | POA: Diagnosis not present

## 2021-10-23 DIAGNOSIS — R5382 Chronic fatigue, unspecified: Secondary | ICD-10-CM | POA: Diagnosis not present

## 2021-10-23 DIAGNOSIS — F411 Generalized anxiety disorder: Secondary | ICD-10-CM | POA: Diagnosis not present

## 2021-12-08 ENCOUNTER — Other Ambulatory Visit: Payer: Self-pay | Admitting: Internal Medicine

## 2021-12-31 DIAGNOSIS — F411 Generalized anxiety disorder: Secondary | ICD-10-CM | POA: Diagnosis not present

## 2021-12-31 DIAGNOSIS — G478 Other sleep disorders: Secondary | ICD-10-CM | POA: Diagnosis not present

## 2021-12-31 DIAGNOSIS — R5382 Chronic fatigue, unspecified: Secondary | ICD-10-CM | POA: Diagnosis not present

## 2021-12-31 DIAGNOSIS — F3162 Bipolar disorder, current episode mixed, moderate: Secondary | ICD-10-CM | POA: Diagnosis not present

## 2021-12-31 DIAGNOSIS — Z79899 Other long term (current) drug therapy: Secondary | ICD-10-CM | POA: Diagnosis not present

## 2021-12-31 DIAGNOSIS — F900 Attention-deficit hyperactivity disorder, predominantly inattentive type: Secondary | ICD-10-CM | POA: Diagnosis not present

## 2022-01-07 ENCOUNTER — Other Ambulatory Visit: Payer: Self-pay | Admitting: Nurse Practitioner

## 2022-01-27 ENCOUNTER — Other Ambulatory Visit: Payer: Self-pay | Admitting: Internal Medicine

## 2022-02-05 ENCOUNTER — Other Ambulatory Visit: Payer: Self-pay

## 2022-02-05 NOTE — Patient Outreach (Signed)
[place] [facility]) Care Management  02/05/2022  Melissa [name] Apr 26, 1971 [id number]   Telephone Screen    Outreach call to patient to introduce [facility] services and assess care needs as part of benefit of PCP office and insurance plan. No answer. RN CM left HIPAA compliant voicemail message along with contact info.   Plan: RN CM will make outreach attempt to patient within 4 business days.   Enzo Montgomery, RN,BSN,CCM [place] Management Telephonic Care Management Coordinator Direct Phone: [phone] Toll Free: [phone] Fax: [phone]

## 2022-02-09 ENCOUNTER — Other Ambulatory Visit: Payer: Self-pay

## 2022-02-09 NOTE — Patient Outreach (Signed)
[facility]) Care Management  02/09/2022  Melissa [name] 05/16/1971 [id number]   Telephone Screen       Unsuccessful outreach call to patient to introduce [facility] services and assess care needs as part of benefit of PCP office and insurance plan.     Plan: RN CM will make outreach attempt to patient within 4 business days.  Enzo Montgomery, RN,BSN,CCM [place] Management Telephonic Care Management Coordinator Direct Phone: [phone] Toll Free: [phone] Fax: [phone]

## 2022-02-11 ENCOUNTER — Other Ambulatory Visit: Payer: Self-pay

## 2022-02-11 NOTE — Patient Outreach (Signed)
[facility] [facility]) Care Management  02/11/2022  Melissa [name] Mar 14, 1971 [id number]     Telephone Screen        Outreach call to patient to introduce [facility] services and assess care needs as part of benefit of PCP office and insurance plan. Spoke with patient. She voices things are going well. Denies any RN CM needs or concerns at this time. Patient aware she may contact [facility] for any future needs or concerns.    Main healthcare issue/concern today: None per patient.    Health Maintenance/Care Gaps: -Last AWV: Patient had physical on 424/23.      Enzo Montgomery, RN,BSN,CCM [place] Management Telephonic Care Management Coordinator Direct Phone: [phone] Toll Free: [phone] Fax: [phone]

## 2022-02-27 ENCOUNTER — Other Ambulatory Visit: Payer: Self-pay | Admitting: *Deleted

## 2022-02-27 DIAGNOSIS — Z1231 Encounter for screening mammogram for malignant neoplasm of breast: Secondary | ICD-10-CM

## 2022-03-19 ENCOUNTER — Ambulatory Visit
Admission: RE | Admit: 2022-03-19 | Discharge: 2022-03-19 | Disposition: A | Discharge status: Home / Self Care | Payer: Medicare HMO | Source: Ambulatory Visit | Attending: Nurse Practitioner | Admitting: Nurse Practitioner

## 2022-03-19 VITALS — BP 124/75 | HR 88 | Temp 98.6°F | Resp 18

## 2022-03-19 DIAGNOSIS — R21 Rash and other nonspecific skin eruption: Secondary | ICD-10-CM | POA: Diagnosis not present

## 2022-03-19 MED ORDER — VALACYCLOVIR HCL 1 G PO TABS: 1000.0000 mg | ORAL_TABLET | Freq: Three times a day (TID) | ORAL | 0 refills | Status: AC

## 2022-03-19 MED ORDER — TRIAMCINOLONE ACETONIDE 0.1 % EX OINT: 1.0000 | TOPICAL_OINTMENT | Freq: Two times a day (BID) | CUTANEOUS | 0 refills | Status: DC

## 2022-03-19 MED ORDER — LEVOCETIRIZINE DIHYDROCHLORIDE 5 MG PO TABS: 5.0000 mg | ORAL_TABLET | Freq: Every evening | ORAL | 0 refills | Status: DC

## 2022-03-19 NOTE — ED Provider Notes (Signed)
[facility]    CSN: [id number] Arrival date & time: 03/19/22  8756      History   Chief Complaint Chief Complaint  Patient presents with   Rash    Entered by patient   Insect Bite    HPI Melissa [name] is a 51 y.o. female.   Patient presents for rash that has been present under her right breast for the past couple of days.  She reports the rash is red and itchy.  Denies burning, oozing, blistering, or pain.  No fevers, or nausea/vomiting.  She denies any recent change in detergents, soaps, personal care products.  No known exposures to contact irritants like poison ivy, sumac, or oak.  She does not garden.  No shortness of breath, throat or tongue swelling, or new muscle pain/joint aches.  She reports she was bit by an insect in the middle of her back last week and does not know if this is related.  No known contacts with similar symptoms.    Past Medical History:  Diagnosis Date   Arthritis    Bipolar disorder ([place])    Fibromyalgia    on disability   Migraine    Muscle spasm    Pain of right side of body    chronic   Panic attack    POTS (postural orthostatic tachycardia syndrome)    Syncope    recurrent   Uterine perforation    hx of    Patient Active Problem List   Diagnosis Date Noted   Myalgia 03/03/2021   Radiculopathy affecting upper extremity 11/14/2020   Thrombocytosis 03/09/2020   Hyperglycemia 03/09/2020   Pneumonia due to COVID-19 virus 03/03/2020   Depression with anxiety 03/03/2020   Sepsis ([place]) 03/03/2020   Acute respiratory failure with hypoxia (HCC)    Chronic migraine without aura without status migrainosus, not intractable 02/11/2020   Anxiety 10/23/2019   Dizziness 07/02/2019   Tremor of both hands 03/27/2018   Syncopal episodes 09/10/2017   Essential hypertension 05/09/2017   Menorrhagia 11/11/2016   Alopecia 03/02/2016   Bilateral cataracts 08/14/2015   Acne 10/21/2014   Liver lesion 09/09/2014   Vitamin D deficiency  09/09/2014   Hot flashes 06/10/2014   Chronic pain syndrome 05/20/2014   Vitamin B12 deficiency 03/12/2014   Contraception management 09/20/2013   IBS (irritable bowel syndrome) 09/20/2013   Unspecified vitamin D deficiency 04/03/2013   Dyslipidemia 04/03/2013   Osteopenia 04/03/2013   POTS (postural orthostatic tachycardia syndrome) 02/26/2013   Migraine 11/29/2012   Allergic rhinitis 10/05/2012   Mixed bipolar I disorder in remission ([place]) 10/05/2012   Combined fat and carbohydrate induced hyperlipemia 10/05/2012   Fibromyalgia    Bipolar disorder ([place])    Fatigue 09/10/2011   Clinical depression 09/10/2011   Gastroesophageal reflux disease without esophagitis 09/10/2011   Non-restorative sleep 09/10/2011   Disturbance in sleep behavior 09/10/2011    Past Surgical History:  Procedure Laterality Date   CATARACT EXTRACTION  dec 2014   DILATION AND CURETTAGE OF UTERUS     LASIK  dec 2014    OB History   No obstetric history on file.      Home Medications    Prior to Admission medications   Medication Sig Start Date End Date Taking? Authorizing Provider  levocetirizine (XYZAL) 5 MG tablet Take 1 tablet (5 mg total) by mouth every evening. 03/19/22  Yes Noemi Chapel A, NP  triamcinolone ointment (KENALOG) 0.1 % Apply 1 Application topically 2 (two) times daily.  Apply sparingly to rash twice daily 03/19/22  Yes Eulogio Bear, NP  valACYclovir (VALTREX) 1000 MG tablet Take 1 tablet (1,000 mg total) by mouth 3 (three) times daily for 10 days. 03/19/22 03/29/22 Yes Eulogio Bear, NP  acetaminophen (TYLENOL) 500 MG tablet Take 1,000 mg by mouth daily as needed for mild pain.    [provider]  baclofen (LIORESAL) 10 MG tablet TAKE 1 TABLET BY MOUTH 3 TIMES DAILY 12/08/21   Cletis Athens, MD  buPROPion (WELLBUTRIN XL) 150 MG 24 hr tablet Take 1 tablet (150 mg total) by mouth daily. 07/17/19   Lucille Passy, MD  clonazePAM (KLONOPIN) 1 MG tablet Take 1 tablet  (1 mg total) by mouth 2 (two) times daily as needed. for anxiety 05/26/16   Lucille Passy, MD  cyclobenzaprine (FLEXERIL) 10 MG tablet TAKE 1 TABLET BY MOUTH 3 TIMES A DAY AS NEEDED FOR MUSCLE SPASM 07/26/21   Cletis Athens, MD  fluvoxaMINE (LUVOX) 50 MG tablet Take 50-100 mg by mouth at bedtime.  05/23/17   [provider]  Fremanezumab-vfrm (AJOVY) 225 MG/1.5ML SOAJ Inject 225 mg into the skin every 30 (thirty) days. 08/18/21   Melvenia Beam, MD  gabapentin (NEURONTIN) 600 MG tablet TAKE 1 TABLET BY MOUTH 3 TIMES DAILY 01/27/22   Cletis Athens, MD  naratriptan (AMERGE) 2.5 MG tablet Take 1 tablet (2.5 mg total) by mouth as needed for migraine. Take one (1) tablet at onset of headache; if returns or does not resolve, may repeat after 4 hours; do not exceed five (5) mg in 24 hours. 08/18/21   Melvenia Beam, MD  norgestimate-ethinyl estradiol ([place] 28) 0.25-35 MG-MCG tablet Take 1 tablet by mouth daily. 07/17/19   Lucille Passy, MD  omeprazole (PRILOSEC) 40 MG capsule TAKE 1 CAPSULE BY MOUTH DAILY 01/07/22   Cletis Athens, MD  ondansetron (ZOFRAN-ODT) 8 MG disintegrating tablet Take 0.5 tablets (4 mg total) by mouth every 8 (eight) hours as needed for nausea or vomiting. 08/18/21   Melvenia Beam, MD  verapamil (CALAN-SR) 120 MG CR tablet TAKE 1 TABLET BY MOUTH AT BEDTIME 09/15/21   Cletis Athens, MD    Family History Family History  Problem Relation Age of Onset   Diabetes Mother    Hypertension Mother    Brain cancer Father    Ovarian cancer Maternal Grandmother        metastasized   Lung cancer Maternal Grandfather    Cancer Paternal Grandmother        not sure of the type   Cancer Paternal Grandfather        not sure of the type   Migraines Neg Hx     Social History Social History   Tobacco Use   Smoking status: Never   Smokeless tobacco: Never  Vaping Use   Vaping Use: Never used  Substance Use Topics   Alcohol use: No    Alcohol/week: 0.0 standard drinks of alcohol    Drug use: No     Allergies   Lamotrigine, Abilify [aripiprazole], Azithromycin, Depakote [divalproex sodium], Erythromycin base, Lithium, Penicillins, Pregabalin, Seroquel [quetiapine fumarate], Sulfa antibiotics, Tetanus toxoids, Tramadol, and Ziprasidone hcl   Review of Systems Review of Systems Per HPI  Physical Exam Triage Vital Signs ED Triage Vitals  Enc Vitals Group     BP 03/19/22 0958 124/75     Pulse Rate 03/19/22 0958 88     Resp 03/19/22 0958 18     Temp 03/19/22  0958 98.6 F (37 C)     Temp src --      SpO2 03/19/22 0958 97 %     Weight --      Height --      Head Circumference --      Peak Flow --      Pain Score 03/19/22 0957 0     Pain Loc --      Pain Edu? --      Excl. in [place]? --    No data found.  Updated Vital Signs BP 124/75   Pulse 88   Temp 98.6 F (37 C)   Resp 18   SpO2 97%   Visual Acuity Right Eye Distance:   Left Eye Distance:   Bilateral Distance:    Right Eye Near:   Left Eye Near:    Bilateral Near:     Physical Exam Vitals and nursing note reviewed.  Constitutional:      General: She is not in acute distress.    Appearance: Normal appearance. She is not toxic-appearing.  HENT:     Head: Normocephalic and atraumatic.     Mouth/Throat:     Mouth: Mucous membranes are moist.     Pharynx: Oropharynx is clear.  Cardiovascular:     Rate and Rhythm: Normal rate and regular rhythm.  Pulmonary:     Effort: Pulmonary effort is normal. No respiratory distress.     Breath sounds: No wheezing, rhonchi or rales.  Skin:    Findings: Erythema and rash present.          Comments: Multiple areas of vesicular-like lesions with surrounding blanchable erythema to right trunk in approximately area marked.  Rash does not cross midline.  No active drainage, oozing, swelling, or fluctuance.  There is a singular slightly erythematous papule approximately 1 cm x 1 cm to the right mid back in approximately area marked.  No surrounding  erythema, fluctuance, active drainage, or tenderness to palpation.  Neurological:     Mental Status: She is alert and oriented to person, place, and time.  Psychiatric:        Behavior: Behavior is cooperative.      UC Treatments / Results  Labs (all labs ordered are listed, but only abnormal results are displayed) Labs Reviewed - No data to display  EKG   Radiology No results found.  Procedures Procedures (including critical care time)  Medications Ordered in UC Medications - No data to display  Initial Impression / Assessment and Plan / UC Course  I have reviewed the triage vital signs and the nursing notes.  Pertinent labs & imaging results that were available during my care of the patient were reviewed by me and considered in my medical decision making (see chart for details).    Patient is well-appearing, normotensive, afebrile, not tachycardic, not tachypneic, oxygenating well on room air.  Differentials include contact dermatitis versus shingles of rash on trunk.  Recommended switching oral antihistamine and using topical steroid ointment twice daily to help with the itching.  Will cover for shingles since it has been 3 days since symptoms started with Valtrex 3 times daily for 10 days.  ER and return precautions discussed.  The patient was given the opportunity to ask questions.  All questions answered to their satisfaction.  The patient is in agreement to this plan.    Final Clinical Impressions(s) / UC Diagnoses   Final diagnoses:  Rash and nonspecific skin eruption     Discharge  Instructions      The rash is consistent with contact dermatitis vs. Shingles  For the contact dermatitis, switch the Allegra to Xyzal and take it daily.  You can also use the steroid ointment that I sent to the pharmacy apply to the rash twice daily.  For possible shingles, start Valtrex 3 times daily starting today for 10 days.  With no improvement or worsening of symptoms after  1-2 weeks, follow-up here with primary care provider     ED Prescriptions     Medication Sig Dispense Auth. Provider   valACYclovir (VALTREX) 1000 MG tablet Take 1 tablet (1,000 mg total) by mouth 3 (three) times daily for 10 days. 30 tablet Noemi Chapel A, NP   levocetirizine (XYZAL) 5 MG tablet Take 1 tablet (5 mg total) by mouth every evening. 30 tablet Noemi Chapel A, NP   triamcinolone ointment (KENALOG) 0.1 % Apply 1 Application topically 2 (two) times daily. Apply sparingly to rash twice daily 30 g Eulogio Bear, NP      PDMP not reviewed this encounter.   Eulogio Bear, NP 03/19/22 1108

## 2022-03-19 NOTE — ED Triage Notes (Signed)
Pt presents has c/o insect bite on back and then developed rash on right abdomen under breast since Tuesday

## 2022-03-19 NOTE — Discharge Instructions (Addendum)
The rash is consistent with contact dermatitis vs. Shingles  For the contact dermatitis, switch the Allegra to Xyzal and take it daily.  You can also use the steroid ointment that I sent to the pharmacy apply to the rash twice daily.  For possible shingles, start Valtrex 3 times daily starting today for 10 days.  With no improvement or worsening of symptoms after 1-2 weeks, follow-up here with primary care provider

## 2022-03-22 ENCOUNTER — Ambulatory Visit: Payer: Medicare HMO | Admitting: Internal Medicine

## 2022-04-01 DIAGNOSIS — F900 Attention-deficit hyperactivity disorder, predominantly inattentive type: Secondary | ICD-10-CM | POA: Diagnosis not present

## 2022-04-01 DIAGNOSIS — F3162 Bipolar disorder, current episode mixed, moderate: Secondary | ICD-10-CM | POA: Diagnosis not present

## 2022-04-01 DIAGNOSIS — Z79899 Other long term (current) drug therapy: Secondary | ICD-10-CM | POA: Diagnosis not present

## 2022-04-01 DIAGNOSIS — G478 Other sleep disorders: Secondary | ICD-10-CM | POA: Diagnosis not present

## 2022-04-01 DIAGNOSIS — R5382 Chronic fatigue, unspecified: Secondary | ICD-10-CM | POA: Diagnosis not present

## 2022-04-01 DIAGNOSIS — F411 Generalized anxiety disorder: Secondary | ICD-10-CM | POA: Diagnosis not present

## 2022-05-20 ENCOUNTER — Other Ambulatory Visit: Payer: Self-pay | Admitting: Internal Medicine

## 2022-06-08 ENCOUNTER — Other Ambulatory Visit: Payer: Self-pay | Admitting: Internal Medicine

## 2022-06-10 ENCOUNTER — Ambulatory Visit (INDEPENDENT_AMBULATORY_CARE_PROVIDER_SITE_OTHER): Payer: Medicare HMO | Admitting: Internal Medicine

## 2022-06-10 ENCOUNTER — Encounter: Payer: Self-pay | Admitting: Internal Medicine

## 2022-06-10 VITALS — BP 134/62 | HR 85 | Temp 97.1°F | Ht 69.0 in | Wt 157.0 lb

## 2022-06-10 DIAGNOSIS — G43709 Chronic migraine without aura, not intractable, without status migrainosus: Secondary | ICD-10-CM | POA: Diagnosis not present

## 2022-06-10 DIAGNOSIS — K582 Mixed irritable bowel syndrome: Secondary | ICD-10-CM | POA: Diagnosis not present

## 2022-06-10 DIAGNOSIS — M797 Fibromyalgia: Secondary | ICD-10-CM

## 2022-06-10 DIAGNOSIS — M8588 Other specified disorders of bone density and structure, other site: Secondary | ICD-10-CM | POA: Diagnosis not present

## 2022-06-10 DIAGNOSIS — I10 Essential (primary) hypertension: Secondary | ICD-10-CM | POA: Diagnosis not present

## 2022-06-10 DIAGNOSIS — G479 Sleep disorder, unspecified: Secondary | ICD-10-CM | POA: Diagnosis not present

## 2022-06-10 DIAGNOSIS — F419 Anxiety disorder, unspecified: Secondary | ICD-10-CM | POA: Diagnosis not present

## 2022-06-10 DIAGNOSIS — K219 Gastro-esophageal reflux disease without esophagitis: Secondary | ICD-10-CM | POA: Diagnosis not present

## 2022-06-10 DIAGNOSIS — E785 Hyperlipidemia, unspecified: Secondary | ICD-10-CM

## 2022-06-10 DIAGNOSIS — G90A Postural orthostatic tachycardia syndrome (POTS): Secondary | ICD-10-CM | POA: Diagnosis not present

## 2022-06-10 DIAGNOSIS — F310 Bipolar disorder, current episode hypomanic: Secondary | ICD-10-CM | POA: Diagnosis not present

## 2022-06-10 MED ORDER — BACLOFEN 10 MG PO TABS: 10.0000 mg | ORAL_TABLET | Freq: Three times a day (TID) | ORAL | 1 refills | Status: DC

## 2022-06-10 NOTE — Assessment & Plan Note (Signed)
Continue omeprazole 

## 2022-06-10 NOTE — Assessment & Plan Note (Signed)
Stable on fluvoxamine, bupropion and clonazepam She will continue to follow with psychiatry

## 2022-06-10 NOTE — Assessment & Plan Note (Signed)
Continue ajovy and naratriptan  she will continue to follow with neurology

## 2022-06-10 NOTE — Assessment & Plan Note (Signed)
No recent episodes on verapamil We will monitor

## 2022-06-10 NOTE — Assessment & Plan Note (Signed)
Not medicated 

## 2022-06-10 NOTE — Patient Instructions (Signed)

## 2022-06-10 NOTE — Assessment & Plan Note (Signed)
Continue calcium and vitamin D Encourage daily weightbearing exercise 

## 2022-06-10 NOTE — Assessment & Plan Note (Signed)
Controlled on verapamil Reinforced DASH diet

## 2022-06-10 NOTE — Assessment & Plan Note (Signed)
Continue Colace and Imodium as needed

## 2022-06-10 NOTE — Assessment & Plan Note (Signed)
Continue cyclobenzaprine, baclofen and gabapentin Encourage regular stretching and core strengthening

## 2022-06-10 NOTE — Assessment & Plan Note (Signed)
Will check lipid profile at annual exam Encourage low-fat diet

## 2022-06-10 NOTE — Progress Notes (Signed)
HPI  Patient presents to clinic today to establish care and for management of the conditions listed below.  Anxiety and Bipolar Depression: Chronic, managed on Fluvoxamine, Bupropion and Clonazepam.  She is currently seeing a therapist and a psychiatrist.  She denies SI/HI.  Migraines: These occur 3 x month.  Triggered by stress.  She is taking Ajovy, and Naratriptan as prescribed.  She follows with neurology.  POTS: She denies recent syncopal episode. She is taking Verapamil as prescribed. She follows with neurology.  Fibromyalgia: Managed with Baclofen, Cyclobenzaprine and Gabapentin.  She does not follow with pain management.  HTN: Her BP today is 134/62.  She is taking Verapamil as prescribed.  ECG from 06/2021 reviewed.  GERD: Triggered by bending forward.  She denies breakthrough on Omeprazole.  There is no upper GI on file.  IBS: She reports alternating constipation or diarrhea.  She takes Colace or Imodium as needed with good relief of symptoms.  There is no colonoscopy on file.  Insomnia: She has difficulty falling and staying asleep.  She is not currently taking any medications for this. There is no sleep study on file.  Osteopenia: She is taking Calcium and Vitamin D OTC.  Bone density from 02/2013 reviewed.  HLD: There is no recent lipid profile on file.  She is not taking any cholesterol-lowering medication at this time.  She tries to consume low-fat diet.  Past Medical History:  Diagnosis Date   Arthritis    Bipolar disorder ([place])    Fibromyalgia    on disability   Migraine    Muscle spasm    Pain of right side of body    chronic   Panic attack    POTS (postural orthostatic tachycardia syndrome)    Syncope    recurrent   Uterine perforation    hx of    Current Outpatient Medications  Medication Sig Dispense Refill   acetaminophen (TYLENOL) 500 MG tablet Take 1,000 mg by mouth daily as needed for mild pain.     baclofen (LIORESAL) 10 MG tablet TAKE 1 TABLET BY  MOUTH 3 TIMES DAILY 90 each 1   buPROPion (WELLBUTRIN XL) 150 MG 24 hr tablet Take 1 tablet (150 mg total) by mouth daily. 90 tablet 3   clonazePAM (KLONOPIN) 1 MG tablet Take 1 tablet (1 mg total) by mouth 2 (two) times daily as needed. for anxiety 60 tablet 0   cyclobenzaprine (FLEXERIL) 10 MG tablet TAKE 1 TABLET BY MOUTH 3 TIMES A DAY AS NEEDED FOR MUSCLE SPASM 90 tablet 0   fluvoxaMINE (LUVOX) 50 MG tablet Take 50-100 mg by mouth at bedtime.      Fremanezumab-vfrm (AJOVY) 225 MG/1.5ML SOAJ Inject 225 mg into the skin every 30 (thirty) days. 1.5 mL 11   gabapentin (NEURONTIN) 600 MG tablet TAKE 1 TABLET BY MOUTH 3 TIMES DAILY 90 tablet 3   levocetirizine (XYZAL) 5 MG tablet Take 1 tablet (5 mg total) by mouth every evening. 30 tablet 0   naratriptan (AMERGE) 2.5 MG tablet Take 1 tablet (2.5 mg total) by mouth as needed for migraine. Take one (1) tablet at onset of headache; if returns or does not resolve, may repeat after 4 hours; do not exceed five (5) mg in 24 hours. 9 tablet 11   norgestimate-ethinyl estradiol (SPRINTEC 28) 0.25-35 MG-MCG tablet Take 1 tablet by mouth daily. 6 Package 1   omeprazole (PRILOSEC) 40 MG capsule TAKE 1 CAPSULE BY MOUTH DAILY 90 capsule 0   ondansetron (ZOFRAN-ODT) 8  MG disintegrating tablet Take 0.5 tablets (4 mg total) by mouth every 8 (eight) hours as needed for nausea or vomiting. 30 tablet 11   triamcinolone ointment (KENALOG) 0.1 % Apply 1 Application topically 2 (two) times daily. Apply sparingly to rash twice daily 30 g 0   verapamil (CALAN-SR) 120 MG CR tablet TAKE 1 TABLET BY MOUTH AT BEDTIME 90 tablet 3   No current facility-administered medications for this visit.    Allergies  Allergen Reactions   Lamotrigine Hives and Swelling   Abilify [Aripiprazole] Other (See Comments)    Weight gain, did not help,    Azithromycin Rash   Depakote [Divalproex Sodium] Other (See Comments)    Hair loss   Erythromycin Base Swelling   Lithium Rash    Penicillins Rash    Has patient had a PCN reaction causing immediate rash, facial/tongue/throat swelling, SOB or lightheadedness with hypotension: Yes- swelling, rash  Has patient had a PCN reaction causing severe rash involving mucus membranes or skin necrosis: Yes Has patient had a PCN reaction that required hospitalization No Has patient had a PCN reaction occurring within the last 10 years: No If all of the above answers are "NO", then may proceed with Cephalosporin use.    Pregabalin Palpitations    Manic symptoms, no sleep x's 3 days, no appetite.   Seroquel [Quetiapine Fumarate] Other (See Comments)    Irritable   Sulfa Antibiotics Rash   Tetanus Toxoids Other (See Comments)    Swelling to injection site   Tramadol Nausea And Vomiting   Ziprasidone Hcl Other (See Comments)    Heat/cold intolerance.     Family History  Problem Relation Age of Onset   Diabetes Mother    Hypertension Mother    Brain cancer Father    Ovarian cancer Maternal Grandmother        metastasized   Lung cancer Maternal Grandfather    Cancer Paternal Grandmother        not sure of the type   Cancer Paternal Grandfather        not sure of the type   Migraines Neg Hx     Social History   Socioeconomic History   Marital status: Married    Spouse name: Izell [place]   Number of children: 0   Years of education: some college   Highest education level: Not on file  Occupational History    Employer: OTHER    Comment: disabled  Tobacco Use   Smoking status: Never   Smokeless tobacco: Never  Vaping Use   Vaping Use: Never used  Substance and Sexual Activity   Alcohol use: No    Alcohol/week: 0.0 standard drinks of alcohol   Drug use: No   Sexual activity: Yes    Birth control/protection: Surgical    Comment: also on OCPs  Other Topics Concern   Not on file  Social History Narrative   08/28/19   From: [place], moved because her husband was from [place]   Living: with husband Izell [place], since 1990    Work: disability due to fibromyalgia and bipolar disorder      Family: mother in law is nearby, her mother is in [place]; has a sister      Enjoys: crochet, baking, reading      Exercise: walking the dog, yoga - tries to do 3 times a week   Diet: not great, more sweets than she should      Safety   Seat belts: Yes  Guns: Yes  and secure   Safe in relationships: Yes          Right handed   Caffeine: none    Social Determinants of Health   Financial Resource Strain: Not on file  Food Insecurity: No Food Insecurity (02/11/2022)   Hunger Vital Sign    Worried About Running Out of Food in the Last Year: Never true    Ran Out of Food in the Last Year: Never true  Transportation Needs: No Transportation Needs (02/11/2022)   PRAPARE - Hydrologist (Medical): No    Lack of Transportation (Non-Medical): No  Physical Activity: Not on file  Stress: Not on file  Social Connections: Not on file  Intimate Partner Violence: Not on file    ROS:  Constitutional: Patient reports intermittent headaches.  Denies fever, malaise, fatigue, or abrupt weight changes.  HEENT: Denies eye pain, eye redness, ear pain, ringing in the ears, wax buildup, runny nose, nasal congestion, bloody nose, or sore throat. Respiratory: Denies difficulty breathing, shortness of breath, cough or sputum production.   Cardiovascular: Denies chest pain, chest tightness, palpitations or swelling in the hands or feet.  Gastrointestinal: Denies abdominal pain, bloating, constipation, diarrhea or blood in the stool.  GU: Denies frequency, urgency, pain with urination, blood in urine, odor or discharge. Musculoskeletal: Patient reports chronic muscle pain.  Denies decrease in range of motion, difficulty with gait, muscle pain or joint swelling.  Skin: Denies redness, rashes, lesions or ulcercations.  Neurological: Patient reports tremor, insomnia.  Denies dizziness, difficulty with memory,  difficulty with speech or problems with balance and coordination.  Psych: Patient has a history of anxiety and depression.  Denies SI/HI.  No other specific complaints in a complete review of systems (except as listed in HPI above).  PE: BP 134/62 (BP Location: Left Arm, Patient Position: Sitting, Cuff Size: Normal)   Pulse 85   Temp (!) 97.1 F (36.2 C) (Temporal)   Ht '5\' 9"'$  (1.753 m)   Wt 157 lb (71.2 kg)   SpO2 99%   BMI 23.18 kg/m   Wt Readings from Last 3 Encounters:  09/17/21 154 lb 8 oz (70.1 kg)  08/18/21 158 lb (71.7 kg)  06/22/21 155 lb (70.3 kg)    General: Appears her stated age, well developed, well nourished in NAD. HEENT: Head: normal shape and size; Eyes: sclera white, no icterus, conjunctiva pink, PERRLA and EOMs intact;  Cardiovascular: Normal rate and rhythm. S1,S2 noted.  No murmur, rubs or gallops noted. No JVD or BLE edema. No carotid bruits noted. Pulmonary/Chest: Normal effort and positive vesicular breath sounds. No respiratory distress. No wheezes, rales or ronchi noted.  Abdomen: Normal bowel sounds. Musculoskeletal: No difficulty with gait.  Neurological: Alert and oriented. Coordination normal.  Psychiatric: Mood and affect normal. Behavior is normal. Judgment and thought content normal.    BMET    Component Value Date/Time   NA 138 06/22/2021 0730   NA 141 06/17/2014 1511   K 4.0 06/22/2021 0730   K 4.0 06/17/2014 1511   CL 103 06/22/2021 0730   CL 109 (H) 06/17/2014 1511   CO2 28 06/22/2021 0730   CO2 23 06/17/2014 1511   GLUCOSE 95 06/22/2021 0730   GLUCOSE 88 06/17/2014 1511   BUN 13 06/22/2021 0730   BUN 11 06/17/2014 1511   CREATININE 0.99 06/22/2021 0730   CREATININE 0.87 04/28/2020 0905   CALCIUM 8.5 (L) 06/22/2021 0730   CALCIUM 8.4 (L) 06/17/2014  [place] 06/22/2021 0730   GFRNONAA 78 04/28/2020 0905   GFRAA 90 04/28/2020 0905    Lipid Panel     Component Value Date/Time   CHOL 245 (H) 06/24/2016 0852   CHOL  203 (H) 02/04/2012 0540   TRIG 330 (H) 03/03/2020 1304   TRIG 300 (H) 02/04/2012 0540   HDL 40.20 06/24/2016 0852   HDL 29 (L) 02/04/2012 0540   CHOLHDL 6 06/24/2016 0852   VLDL 72.6 (H) 06/24/2016 0852   VLDL 60 (H) 02/04/2012 0540   LDLCALC 114 (H) 02/04/2012 0540    CBC    Component Value Date/Time   WBC 9.3 09/17/2021 1059   RBC 4.44 09/17/2021 1059   HGB 13.0 09/17/2021 1059   HGB 14.9 06/17/2014 1511   HCT 38.5 09/17/2021 1059   HCT 46.4 06/17/2014 1511   PLT 275 09/17/2021 1059   PLT 290 06/17/2014 1511   MCV 86.7 09/17/2021 1059   MCV 85 06/17/2014 1511   MCH 29.3 09/17/2021 1059   MCHC 33.8 09/17/2021 1059   RDW 12.5 09/17/2021 1059   RDW 14.7 (H) 06/17/2014 1511   LYMPHSABS 1,274 09/17/2021 1059   LYMPHSABS 4.2 (H) 11/05/2012 0547   MONOABS 0.5 02/21/2021 0445   MONOABS 1.0 (H) 11/05/2012 0547   EOSABS 102 09/17/2021 1059   EOSABS 0.1 11/05/2012 0547   BASOSABS 112 09/17/2021 1059   BASOSABS 0.1 11/05/2012 0547    Hgb A1C Lab Results  Component Value Date   HGBA1C 5.2 04/28/2020     Assessment and Plan:  RTC in 6 months for follow up chronic conditions Webb Silversmith, NP

## 2022-07-01 DIAGNOSIS — F411 Generalized anxiety disorder: Secondary | ICD-10-CM | POA: Diagnosis not present

## 2022-07-01 DIAGNOSIS — F3162 Bipolar disorder, current episode mixed, moderate: Secondary | ICD-10-CM | POA: Diagnosis not present

## 2022-07-01 DIAGNOSIS — G478 Other sleep disorders: Secondary | ICD-10-CM | POA: Diagnosis not present

## 2022-07-01 DIAGNOSIS — Z79899 Other long term (current) drug therapy: Secondary | ICD-10-CM | POA: Diagnosis not present

## 2022-07-01 DIAGNOSIS — R5382 Chronic fatigue, unspecified: Secondary | ICD-10-CM | POA: Diagnosis not present

## 2022-07-20 ENCOUNTER — Encounter: Payer: Self-pay | Admitting: Internal Medicine

## 2022-08-05 ENCOUNTER — Telehealth: Payer: Self-pay | Admitting: Neurology

## 2022-08-05 NOTE — Telephone Encounter (Signed)
Sharyn Lull from [employer] [phone] option 3 states a PA is needed for pt's Ajovy

## 2022-08-06 ENCOUNTER — Ambulatory Visit (INDEPENDENT_AMBULATORY_CARE_PROVIDER_SITE_OTHER): Payer: Medicare HMO | Admitting: Internal Medicine

## 2022-08-06 ENCOUNTER — Encounter: Payer: Self-pay | Admitting: Internal Medicine

## 2022-08-06 VITALS — BP 122/76 | HR 84 | Temp 97.3°F | Wt 157.0 lb

## 2022-08-06 DIAGNOSIS — M797 Fibromyalgia: Secondary | ICD-10-CM

## 2022-08-06 MED ORDER — GABAPENTIN 600 MG PO TABS: 600.0000 mg | ORAL_TABLET | Freq: Three times a day (TID) | ORAL | 1 refills | Status: DC

## 2022-08-06 MED ORDER — TRAMADOL HCL 50 MG PO TABS: 50.0000 mg | ORAL_TABLET | Freq: Three times a day (TID) | ORAL | 0 refills | Status: DC | PRN

## 2022-08-06 NOTE — Progress Notes (Signed)
Subjective:    Patient ID: Melissa [name], female    DOB: 1970/11/02, 52 y.o.   MRN: [id number]  HPI  Patient presents to clinic today with complaint of fatigue, muscle pain, joint pain and difficulty staying asleep. She reports this started 3 weeks ago. She denies joint swelling, redness or rashes. She has a history of fibromyalgia, managed on Tylenol, Baclofen, Flexeril and Gabapentin. She has tried a Engineer, materials, Icy Hot with minimal relief of symptoms.  Review of Systems     Past Medical History:  Diagnosis Date   Arthritis    Bipolar disorder ([place])    Fibromyalgia    on disability   Migraine    Muscle spasm    Pain of right side of body    chronic   Panic attack    POTS (postural orthostatic tachycardia syndrome)    Syncope    recurrent   Uterine perforation    hx of    Current Outpatient Medications  Medication Sig Dispense Refill   acetaminophen (TYLENOL) 500 MG tablet Take 1,000 mg by mouth daily as needed for mild pain.     baclofen (LIORESAL) 10 MG tablet Take 1 tablet (10 mg total) by mouth 3 (three) times daily. 90 each 1   buPROPion (WELLBUTRIN XL) 150 MG 24 hr tablet Take 1 tablet (150 mg total) by mouth daily. 90 tablet 3   clonazePAM (KLONOPIN) 1 MG tablet Take 1 tablet (1 mg total) by mouth 2 (two) times daily as needed. for anxiety 60 tablet 0   cyclobenzaprine (FLEXERIL) 10 MG tablet TAKE 1 TABLET BY MOUTH 3 TIMES A DAY AS NEEDED FOR MUSCLE SPASM 90 tablet 0   fexofenadine (ALLEGRA) 180 MG tablet Take 180 mg by mouth daily.     fluvoxaMINE (LUVOX) 50 MG tablet Take 50-100 mg by mouth at bedtime.      Fremanezumab-vfrm (AJOVY) 225 MG/1.5ML SOAJ Inject 225 mg into the skin every 30 (thirty) days. 1.5 mL 11   gabapentin (NEURONTIN) 600 MG tablet TAKE 1 TABLET BY MOUTH 3 TIMES DAILY 90 tablet 3   naratriptan (AMERGE) 2.5 MG tablet Take 1 tablet (2.5 mg total) by mouth as needed for migraine. Take one (1) tablet at onset of headache; if returns or does not  resolve, may repeat after 4 hours; do not exceed five (5) mg in 24 hours. 9 tablet 11   norgestimate-ethinyl estradiol (SPRINTEC 28) 0.25-35 MG-MCG tablet Take 1 tablet by mouth daily. 6 Package 1   omeprazole (PRILOSEC) 40 MG capsule TAKE 1 CAPSULE BY MOUTH DAILY 90 capsule 0   ondansetron (ZOFRAN-ODT) 8 MG disintegrating tablet Take 0.5 tablets (4 mg total) by mouth every 8 (eight) hours as needed for nausea or vomiting. 30 tablet 11   verapamil (CALAN-SR) 120 MG CR tablet TAKE 1 TABLET BY MOUTH AT BEDTIME 90 tablet 3   No current facility-administered medications for this visit.    Allergies  Allergen Reactions   Lamotrigine Hives and Swelling   Abilify [Aripiprazole] Other (See Comments)    Weight gain, did not help,    Azithromycin Rash   Depakote [Divalproex Sodium] Other (See Comments)    Hair loss   Erythromycin Base Swelling   Lithium Rash   Penicillins Rash    Has patient had a PCN reaction causing immediate rash, facial/tongue/throat swelling, SOB or lightheadedness with hypotension: Yes- swelling, rash  Has patient had a PCN reaction causing severe rash involving mucus membranes or skin necrosis: Yes Has patient  had a PCN reaction that required hospitalization No Has patient had a PCN reaction occurring within the last 10 years: No If all of the above answers are "NO", then may proceed with Cephalosporin use.    Pregabalin Palpitations    Manic symptoms, no sleep x's 3 days, no appetite.   Seroquel [Quetiapine Fumarate] Other (See Comments)    Irritable   Sulfa Antibiotics Rash   Tetanus Toxoids Other (See Comments)    Swelling to injection site   Tramadol Nausea And Vomiting   Ziprasidone Hcl Other (See Comments)    Heat/cold intolerance.     Family History  Problem Relation Age of Onset   Diabetes Mother    Hypertension Mother    Brain cancer Father    Healthy Sister    Ovarian cancer Maternal Grandmother        metastasized   Lung cancer Maternal  Grandfather    Cancer Paternal Grandmother        not sure of the type   Cancer Paternal Grandfather        not sure of the type   Migraines Neg Hx     Social History   Socioeconomic History   Marital status: Married    Spouse name: Izell [place]   Number of children: 0   Years of education: some college   Highest education level: Not on file  Occupational History    Employer: OTHER    Comment: disabled  Tobacco Use   Smoking status: Never   Smokeless tobacco: Never  Vaping Use   Vaping Use: Never used  Substance and Sexual Activity   Alcohol use: No    Alcohol/week: 0.0 standard drinks of alcohol   Drug use: No   Sexual activity: Yes    Birth control/protection: Surgical    Comment: also on OCPs  Other Topics Concern   Not on file  Social History Narrative   08/28/19   From: [place], moved because her husband was from [place]: with husband Izell [place], since 1990   Work: disability due to fibromyalgia and bipolar disorder      Family: mother in law is nearby, her mother is in [place]; has a sister      Enjoys: crochet, baking, reading      Exercise: walking the dog, yoga - tries to do 3 times a week   Diet: not great, more sweets than she should      Safety   Seat belts: Yes    Guns: Yes  and secure   Safe in relationships: Yes          Right handed   Caffeine: none    Social Determinants of Health   Financial Resource Strain: Not on file  Food Insecurity: No Food Insecurity (02/11/2022)   Hunger Vital Sign    Worried About Running Out of Food in the Last Year: Never true    [place] in the Last Year: Never true  Transportation Needs: No Transportation Needs (02/11/2022)   PRAPARE - Hydrologist (Medical): No    Lack of Transportation (Non-Medical): No  Physical Activity: Not on file  Stress: Not on file  Social Connections: Not on file  Intimate Partner Violence: Not on file     Constitutional: Patient reports fatigue.   Denies fever, malaise, headache or abrupt weight changes.  Respiratory: Denies difficulty breathing, shortness of breath, cough or sputum production.   Cardiovascular: Denies chest pain, chest tightness,  palpitations or swelling in the hands or feet.  Musculoskeletal: Patient reports chronic muscle and joint pain.  Denies decrease in range of motion, difficulty with gait, or joint swelling.  Skin: Denies redness, rashes, lesions or ulcercations.  Neurological: Patient reports neuropathic pain and insomnia.  Denies dizziness, difficulty with memory, difficulty with speech or problems with balance and coordination.    No other specific complaints in a complete review of systems (except as listed in HPI above).  Objective:   Physical Exam  BP 122/76 (BP Location: Right Arm, Patient Position: Sitting, Cuff Size: Normal)   Pulse 84   Temp (!) 97.3 F (36.3 C) (Temporal)   Wt 157 lb (71.2 kg)   SpO2 100%   BMI 23.18 kg/m   Wt Readings from Last 3 Encounters:  06/10/22 157 lb (71.2 kg)  09/17/21 154 lb 8 oz (70.1 kg)  08/18/21 158 lb (71.7 kg)    General: Appears her stated age, well developed, well nourished in NAD. Skin: Warm, dry and intact.  HEENT: Head: normal shape and size; Eyes: sclera white, no icterus, conjunctiva pink, PERRLA and EOMs intact;  Cardiovascular: Normal rate and rhythm. S1,S2 noted.  No murmur, rubs or gallops noted.  Pulmonary/Chest: Normal effort and positive vesicular breath sounds. No respiratory distress. No wheezes, rales or ronchi noted.  Musculoskeletal: Multiple tender spots noted on the upper chest, entire back, upper arms and thighs.  No joint swelling noted.  Strength 5/5 BUE.  Handgrips equal.  No difficulty with gait.  Neurological: Alert and oriented.     BMET    Component Value Date/Time   NA 138 06/22/2021 0730   NA 141 06/17/2014 1511   K 4.0 06/22/2021 0730   K 4.0 06/17/2014 1511   CL 103 06/22/2021 0730   CL 109 (H) 06/17/2014 1511    CO2 28 06/22/2021 0730   CO2 23 06/17/2014 1511   GLUCOSE 95 06/22/2021 0730   GLUCOSE 88 06/17/2014 1511   BUN 13 06/22/2021 0730   BUN 11 06/17/2014 1511   CREATININE 0.99 06/22/2021 0730   CREATININE 0.87 04/28/2020 0905   CALCIUM 8.5 (L) 06/22/2021 0730   CALCIUM 8.4 (L) 06/17/2014 1511   GFRNONAA >60 06/22/2021 0730   GFRNONAA 78 04/28/2020 0905   GFRAA 90 04/28/2020 0905    Lipid Panel     Component Value Date/Time   CHOL 245 (H) 06/24/2016 0852   CHOL 203 (H) 02/04/2012 0540   TRIG 330 (H) 03/03/2020 1304   TRIG 300 (H) 02/04/2012 0540   HDL 40.20 06/24/2016 0852   HDL 29 (L) 02/04/2012 0540   CHOLHDL 6 06/24/2016 0852   VLDL 72.6 (H) 06/24/2016 0852   VLDL 60 (H) 02/04/2012 0540   LDLCALC 114 (H) 02/04/2012 0540    CBC    Component Value Date/Time   WBC 9.3 09/17/2021 1059   RBC 4.44 09/17/2021 1059   HGB 13.0 09/17/2021 1059   HGB 14.9 06/17/2014 1511   HCT 38.5 09/17/2021 1059   HCT 46.4 06/17/2014 1511   PLT 275 09/17/2021 1059   PLT 290 06/17/2014 1511   MCV 86.7 09/17/2021 1059   MCV 85 06/17/2014 1511   MCH 29.3 09/17/2021 1059   MCHC 33.8 09/17/2021 1059   RDW 12.5 09/17/2021 1059   RDW 14.7 (H) 06/17/2014 1511   LYMPHSABS 1,274 09/17/2021 1059   LYMPHSABS 4.2 (H) 11/05/2012 0547   MONOABS 0.5 02/21/2021 0445   MONOABS 1.0 (H) 11/05/2012 0547   EOSABS 102 09/17/2021 1059  EOSABS 0.1 11/05/2012 0547   BASOSABS 112 09/17/2021 1059   BASOSABS 0.1 11/05/2012 0547    Hgb A1C Lab Results  Component Value Date   HGBA1C 5.2 04/28/2020           Assessment & Plan:   Fibromyalgia Flare:  Continue Tylenol, Baclofen, Flexeril and Gabapentin Will give limited supply of tramadol 50 mg every 8 hours as needed for severe pain Encourage stretching with yoga or Pilates Recommended trying a hot or ice bath to see if this helps  RTC in 4 months for your annual exam Webb Silversmith, NP

## 2022-08-06 NOTE — Patient Instructions (Signed)
Myofascial Pain Syndrome and Fibromyalgia ?Myofascial pain syndrome and fibromyalgia are both pain disorders. You may feel this pain mainly in your muscles. ?Myofascial pain syndrome: ?Always has tender points in the muscles that will cause pain when pressed (trigger points). The pain may come and go. ?Usually affects your neck, upper back, and shoulder areas. The pain often moves into your arms and hands. ?Fibromyalgia: ?Has muscle pains and tenderness that come and go. ?Is often associated with tiredness (fatigue) and sleep problems. ?Has trigger points. ?Tends to be long-lasting (chronic), but is not life-threatening. ?Fibromyalgia and myofascial pain syndrome are not the same. However, they often occur together. If you have both conditions, each can make the other worse. Both are common and can cause enough pain and fatigue to make day-to-day activities difficult. Both can be hard to diagnose because their symptoms are common in many other conditions. ?What are the causes? ?The exact causes of these conditions are not known. ?What increases the risk? ?You are more likely to develop either of these conditions if: ?You have a family history of the condition. ?You are female. ?You have certain triggers, such as: ?Spine disorders. ?An injury (trauma) or other physical stressors. ?Being under a lot of stress. ?Medical conditions such as osteoarthritis, rheumatoid arthritis, or lupus. ?What are the signs or symptoms? ?Fibromyalgia ?The main symptom of fibromyalgia is widespread pain and tenderness in your muscles. Pain is sometimes described as stabbing, shooting, or burning. ?You may also have: ?Tingling or numbness. ?Sleep problems and fatigue. ?Problems with attention and concentration (fibro fog). ?Other symptoms may include: ?Bowel and bladder problems. ?Headaches. ?Vision problems. ?Sensitivity to odors and noises. ?Depression or mood changes. ?Painful menstrual periods (dysmenorrhea). ?Dry skin or eyes. ?These  symptoms can vary over time. ?Myofascial pain syndrome ?Symptoms of myofascial pain syndrome include: ?Tight, ropy bands of muscle. ?Uncomfortable sensations in muscle areas. These may include aching, cramping, burning, numbness, tingling, and weakness. ?Difficulty moving certain parts of the body freely (poor range of motion). ?How is this diagnosed? ?This condition may be diagnosed by your symptoms and medical history. You will also have a physical exam. In general: ?Fibromyalgia is diagnosed if you have pain, fatigue, and other symptoms for more than 3 months, and symptoms cannot be explained by another condition. ?Myofascial pain syndrome is diagnosed if you have trigger points in your muscles, and those trigger points are tender and cause pain elsewhere in your body (referred pain). ?How is this treated? ?Treatment for these conditions depends on the type that you have. ?For fibromyalgia a healthy lifestyle is the most important treatment including aerobic and strength exercises. Different types of medicines are used to help treat pain and include: ?NSAIDs. ?Medicines for treating depression. ?Medicines that help control seizures. ?Medicines that relax the muscles. ?Treatment for myofascial pain syndrome includes: ?Pain medicines, such as NSAIDs. ?Cooling and stretching of muscles. ?Massage therapy with myofascial release technique. ?Trigger point injections. ?Treating these conditions often requires a team of health care providers. These may include: ?Your primary care provider. ?A physical therapist. ?Complementary health care providers, such as massage therapists or acupuncturists. ?A psychiatrist for cognitive behavioral therapy. ?Follow these instructions at home: ?Medicines ?Take over-the-counter and prescription medicines only as told by your health care provider. ?Ask your health care provider if the medicine prescribed to you: ?Requires you to avoid driving or using machinery. ?Can cause constipation.  You may need to take these actions to prevent or treat constipation: ?Drink enough fluid to keep your urine pale   yellow. ?Take over-the-counter or prescription medicines. ?Eat foods that are high in fiber, such as beans, whole grains, and fresh fruits and vegetables. ?Limit foods that are high in fat and processed sugars, such as fried or sweet foods. ?Lifestyle ? ?Do exercises as told by your health care provider or physical therapist. ?Practice relaxation techniques to control your stress. You may want to try: ?Biofeedback. ?Visual imagery. ?Hypnosis. ?Muscle relaxation. ?Yoga. ?Meditation. ?Maintain a healthy lifestyle. This includes eating a healthy diet and getting enough sleep. ?Do not use any products that contain nicotine or tobacco. These products include cigarettes, chewing tobacco, and vaping devices, such as e-cigarettes. If you need help quitting, ask your health care provider. ?General instructions ?Talk to your health care provider about complementary treatments, such as acupuncture or massage. ?Do not do activities that stress or strain your muscles. This includes repetitive motions and heavy lifting. ?Keep all follow-up visits. This is important. ?Where to find support ?Consider joining a support group with others who are diagnosed with this condition. ?National Fibromyalgia Association: www.fmaware.org ?Where to find more information ?American Chronic Pain Association: www.theacpa.org ?Contact a health care provider if: ?You have new symptoms. ?Your symptoms get worse or your pain is severe. ?You have side effects from your medicines. ?You have trouble sleeping. ?Your condition is causing depression or anxiety. ?Get help right away if: ?You have thoughts of hurting yourself or others. ?Get help right awayif you feel like you may hurt yourself or others, or have thoughts about taking your own life. Go to your nearest emergency room or: ?Call 911. ?Call the National Suicide Prevention Lifeline at  1-800-273-8255 or 988. This is open 24 hours a day. ?Text the Crisis Text Line at 741741. ?Summary ?Myofascial pain syndrome and fibromyalgia are pain disorders. ?Myofascial pain syndrome has tender points in the muscles that will cause pain when pressed (trigger points). Fibromyalgia also has muscle pains and tenderness that come and go, but this condition is often associated with fatigue and sleep disturbances. ?Fibromyalgia and myofascial pain syndrome are not the same but often occur together, causing pain and fatigue that make day-to-day activities difficult. ?Follow your health care provider's instructions for taking medicines and maintaining a healthy lifestyle. ?This information is not intended to replace advice given to you by your health care provider. Make sure you discuss any questions you have with your health care provider. ?Document Revised: 05/08/2021 Document Reviewed: 05/08/2021 ?Elsevier Patient Education ? 2023 Elsevier Inc. ? ?

## 2022-08-09 NOTE — Telephone Encounter (Signed)
I initiated a PA on CMM but we can't complete it yet until hearing from patient. Key: Melissa [name]

## 2022-08-10 ENCOUNTER — Ambulatory Visit: Payer: Self-pay

## 2022-08-10 ENCOUNTER — Encounter: Payer: Self-pay | Admitting: Family Medicine

## 2022-08-10 ENCOUNTER — Ambulatory Visit (INDEPENDENT_AMBULATORY_CARE_PROVIDER_SITE_OTHER): Payer: Medicare HMO | Admitting: Family Medicine

## 2022-08-10 VITALS — BP 114/72 | HR 77 | Temp 96.8°F | Wt 154.0 lb

## 2022-08-10 DIAGNOSIS — F310 Bipolar disorder, current episode hypomanic: Secondary | ICD-10-CM

## 2022-08-10 DIAGNOSIS — F5104 Psychophysiologic insomnia: Secondary | ICD-10-CM | POA: Diagnosis not present

## 2022-08-10 DIAGNOSIS — M797 Fibromyalgia: Secondary | ICD-10-CM | POA: Diagnosis not present

## 2022-08-10 MED ORDER — KETOROLAC TROMETHAMINE 60 MG/2ML IM SOLN: 60.0000 mg | Freq: Once | INTRAMUSCULAR | Status: AC

## 2022-08-10 MED ORDER — MIRTAZAPINE 7.5 MG PO TABS: 7.5000 mg | ORAL_TABLET | Freq: Every day | ORAL | 0 refills | Status: DC

## 2022-08-10 MED ORDER — METHOCARBAMOL 750 MG PO TABS: 750.0000 mg | ORAL_TABLET | Freq: Three times a day (TID) | ORAL | 0 refills | Status: DC | PRN

## 2022-08-10 NOTE — Telephone Encounter (Signed)
Ajovy PA completed on CMM. Key: [id number]. Awaiting determination from [facility]. This is non-formulary but hopefully [employer] will approve anyway.

## 2022-08-10 NOTE — Patient Instructions (Addendum)
Thank you for coming to the office today.  Discontinue Baclofen Switch to Robaxin (Methocarbamol) 723m can take 1 or 2, up to 3 times a day.  Start Mirtazapine 7.556m(remeron generic) nightly 30 min before bed, has higher sedation at low dose. May take a few doses for best result. Can take for 2+ weeks and see, and consider pause or restart again in future.  Toradol 6049mnjection today  HOLD Ibuprofen Advil Mobic for now 24-48 hours.  -----  Consider options for future pain management referral for more longer term solutions.  [facility] Address: [address] [id number]: [phone] BilGillis Santa FraMilinda Pointer-----   [facility] [place]  [id number]: [phone] Address: [address] [id number]: [phone]  Please schedule a Follow-up Appointment to: Return if symptoms worsen or fail to improve.  If you have any other questions or concerns, please feel free to call the office or send a message through [facility] [place] may also schedule an earlier appointment if necessary.  Additionally, you may be receiving a survey about your experience at our office within a few days to 1 week by e-mail or mail. We value your feedback.  AleNobie PutnamO [place] 

## 2022-08-10 NOTE — Telephone Encounter (Signed)
I called pt and she said she is doing well on ajovy has decreased her frequency and intensity of her migraines for 2 per week to now 2 per month.  She appreciated the call.  Will finish the PA, hopefully will approve.

## 2022-08-10 NOTE — Progress Notes (Signed)
Subjective:    Patient ID: Melissa [name], female    DOB: 1970-09-23, 52 y.o.   MRN: [id number]  Melissa [name] is a 52 y.o. female presenting on 08/10/2022 for Insomnia (/)  Patient presents for a same day appointment.  PCP Webb Silversmith, FNP   HPI  Fibromyalgia Insomnia Bipolar  Last seen by PCP Webb Silversmith, FNP here 4 days ago on 08/06/22. For same issue with fibromyalgia pain forearms, legs various location and secondary insomnia worsening. She was given rx Tramadol short term temporary pain relief to help resolve current flare. It was unsuccessful and patient called back today in triage with worsening pain 8-9/10 arms and legs, only sleeping 2-4 hours at a time, poor sleep, impacting her function.  She has a long history of mental health disorder with bipolar and has had this chronic fibromyalgia for 30+ years reported as well.  She is followed by [facility] in [place]. They have been managing her mental health medications.  Currently taking - Wellbutrin XL 150 daily - Clonazepam 54m TWICE A DAY AS NEEDED - Fluvoxamine 559mdaily  Admits difficulty with Poor sleep insomnia Sleep medication has often made her more hyper.  Previous meds Tried Ambien, Trazodone, among others.  Previous meds: Tylenol, Advil, Mobic, Baclofen, Flexeril and Gabapentin , Hydrocodone, Cymbalta (Duloxetine), Amitriptyline (Elavil), Nortriptyline, Tizanidine, Lyrica (Pregabalin), difficulty with meds over the years. Some side effects or ineffective over few months up.  In past was at [facility] [place] [facility] [place] since they mostly managed the pain medications only. Also [facility] had [facility] but it was difficult since they had a visiting doctor and they were not covered by insurance. That was years ago.  She does stretching and yoga to help      08/06/2022   10:15 AM 06/10/2022   10:07 AM 11/14/2020   10:04 AM  Depression screen PHQ 2/9   Decreased Interest 0 0 0  Down, Depressed, Hopeless 0 1 0  PHQ - 2 Score 0 1 0  Difficult doing work/chores   Not difficult at all    Social History   Tobacco Use   Smoking status: Never   Smokeless tobacco: Never  Vaping Use   Vaping Use: Never used  Substance Use Topics   Alcohol use: No    Alcohol/week: 0.0 standard drinks of alcohol   Drug use: No    Review of Systems Per HPI unless specifically indicated above     Objective:    BP 114/72 (BP Location: Right Arm, Patient Position: Sitting, Cuff Size: Normal)   Pulse 77   Temp (!) 96.8 F (36 C) (Temporal)   Wt 154 lb (69.9 kg)   SpO2 99%   BMI 22.74 kg/m   Wt Readings from Last 3 Encounters:  08/10/22 154 lb (69.9 kg)  08/06/22 157 lb (71.2 kg)  06/10/22 157 lb (71.2 kg)    Physical Exam Vitals and nursing note reviewed.  Constitutional:      General: She is not in acute distress.    Appearance: Normal appearance. She is well-developed. She is not diaphoretic.     Comments: Well-appearing, uncomfortable due to generalized pain, cooperative  HENT:     Head: Normocephalic and atraumatic.  Eyes:     General:        Right eye: No discharge.        Left eye: No discharge.     Conjunctiva/sclera: Conjunctivae normal.  Cardiovascular:     Rate and Rhythm: Normal rate.  Pulmonary:     Effort: Pulmonary effort is normal.  Skin:    General: Skin is warm and dry.     Findings: No erythema or rash.  Neurological:     Mental Status: She is alert and oriented to person, place, and time.  Psychiatric:        Mood and Affect: Mood normal.        Behavior: Behavior normal.        Thought Content: Thought content normal.     Comments: Well groomed, good eye contact, normal speech and thoughts    Results for orders placed or performed in visit on 07/20/22  HM MAMMOGRAPHY  Result Value Ref Range   HM Mammogram 0-4 Bi-Rad 0-4 Bi-Rad, Self Reported Normal      Assessment & Plan:   Problem List Items  Addressed This Visit     Bipolar disorder ([place])   Fibromyalgia - Primary   Relevant Medications   mirtazapine (REMERON) 7.5 MG tablet   methocarbamol (ROBAXIN) 750 MG tablet   ketorolac (TORADOL) injection 60 mg   Other Visit Diagnoses     Psychophysiological insomnia       Relevant Medications   mirtazapine (REMERON) 7.5 MG tablet   methocarbamol (ROBAXIN) 750 MG tablet       Chronic pain / Fibromyalgia Bipolar / Insomnia  Chronic conditions. Today is an acute flare of her pain fibromyalgia  She saw PCP on 2/16  Ineffective Tramadol and Baclofen  Today we discussed in detail difficulty with the chronic nature of fibromyalgia and her pain/mood/sleep complaints.  We reviewed list of past tried failed medications. We have limited options today. She is in agreement that we are not able to use other opiate or narcotic therapy to manage this type of pain.  We will try to improve her current level of pain control and help her sleep.  Stop Baclofen - ineffective Switch to Robaxin (Methocarbamol) 727m can take 1 or 2, up to 3 times a day.  Add for insomnia Start Mirtazapine 7.544m(remeron generic) nightly 30 min before bed, has higher sedation at low dose. May take a few doses for best result. Can take for 2+ weeks and see, and consider pause or restart again in future. - Caution seratonin syndrome risk while on Luvox, but it is low dose. Should be okay to take 2-4 week and see if effective or can use more short term and repeat in future.  Also add Toradol 603mnjection today HOLD Ibuprofen Advil Mobic for now 24-48 hours.   Consider options for future pain management referral for more longer term solutions. AVS info on referral options [facility] and [facility] - as future option to consider, she can notify us [place] want to pursue referral.  Meds ordered this encounter  Medications   mirtazapine (REMERON) 7.5 MG tablet    Sig: Take 1 tablet (7.5 mg total) by mouth at bedtime.     Dispense:  30 tablet    Refill:  0   methocarbamol (ROBAXIN) 750 MG tablet    Sig: Take 1-2 tablets (750-1,500 mg total) by mouth every 8 (eight) hours as needed for muscle spasms (fibromyalgia).    Dispense:  90 tablet    Refill:  0   ketorolac (TORADOL) injection 60 mg      Follow up plan: Return if symptoms worsen or fail to improve.  AleNobie PutnamO [facility] 08/10/2022, 1:25 PM

## 2022-08-10 NOTE — Telephone Encounter (Signed)
I called pt and LMVM for her regarding approval.  I called [employer] and spoke to [place], I relayed PA case # approval for ajovy with effective dates.   She will move forward on approval for pt.

## 2022-08-10 NOTE — Telephone Encounter (Signed)
  Chief Complaint: Fibromyalgia  pain/not sleeping Symptoms: Pain in forearms, legs back and neck, unable to sleep from pain Frequency: ongoing Pertinent Negatives: Patient denies  Disposition: []$ ED /[]$ Urgent Care (no appt availability in office) / [x]$ Appointment(In office/virtual)/ []$  [facility]/ []$ Home Care/ []$ Refused Recommended Disposition /[]$ [place] [facility]/ []$  Follow-up with PCP Additional Notes: Pt states that she has Fibromyalgia. She was seen on 08/06/2022 for this pain. Pt was given medicine which has been ineffective at controlling pain. PT states that the pain goes "right down to her bones". Pain 8-9/10. Pt states that she is sleeping about 4 hours a night because she cannot get comfortable from the pain. Full triage not performed as this is an ongoing problem for pt with little change from previous appt.  Reason for Disposition  [1] MODERATE pain (e.g., interferes with normal activities) AND [2] present > 3 days  Answer Assessment - Initial Assessment Questions 1. ONSET: "When did the pain start?"     Ongoing 2. LOCATION: "Where is the pain located?"     Forearms, legs back and neck 3. PAIN: "How bad is the pain?" (Scale 1-10; or mild, moderate, severe)   - MILD (1-3): Doesn't interfere with normal activities.   - MODERATE (4-7): Interferes with normal activities (e.g., work or school) or awakens from sleep.   - SEVERE (8-10): Excruciating pain, unable to do any normal activities, unable to hold a cup of water.     Severe 4. WORK OR EXERCISE: "Has there been any recent work or exercise that involved this part of the body?"      5. CAUSE: "What do you think is causing the arm pain?"     Fibromyalgia 6. OTHER SYMPTOMS: "Do you have any other symptoms?" (e.g., neck pain, swelling, rash, fever, numbness, weakness)      7. PREGNANCY: "Is there any chance you are pregnant?" "When was your last menstrual period?"  Protocols used: Arm Pain-A-AH

## 2022-08-12 DIAGNOSIS — H40023 Open angle with borderline findings, high risk, bilateral: Secondary | ICD-10-CM | POA: Diagnosis not present

## 2022-08-12 DIAGNOSIS — Z0100 Encounter for examination of eyes and vision without abnormal findings: Secondary | ICD-10-CM | POA: Diagnosis not present

## 2022-08-12 DIAGNOSIS — Z961 Presence of intraocular lens: Secondary | ICD-10-CM | POA: Diagnosis not present

## 2022-08-12 DIAGNOSIS — H52223 Regular astigmatism, bilateral: Secondary | ICD-10-CM | POA: Diagnosis not present

## 2022-08-12 DIAGNOSIS — H5203 Hypermetropia, bilateral: Secondary | ICD-10-CM | POA: Diagnosis not present

## 2022-08-12 DIAGNOSIS — H43392 Other vitreous opacities, left eye: Secondary | ICD-10-CM | POA: Diagnosis not present

## 2022-08-12 DIAGNOSIS — H524 Presbyopia: Secondary | ICD-10-CM | POA: Diagnosis not present

## 2022-08-23 ENCOUNTER — Ambulatory Visit: Payer: Medicare HMO | Admitting: Adult Health

## 2022-08-27 ENCOUNTER — Other Ambulatory Visit: Payer: Self-pay

## 2022-08-27 MED ORDER — OMEPRAZOLE 40 MG PO CPDR: 40.0000 mg | DELAYED_RELEASE_CAPSULE | Freq: Every day | ORAL | 1 refills | Status: DC

## 2022-08-30 ENCOUNTER — Telehealth: Payer: Self-pay | Admitting: Neurology

## 2022-08-30 ENCOUNTER — Other Ambulatory Visit: Payer: Self-pay | Admitting: *Deleted

## 2022-08-30 MED ORDER — NARATRIPTAN HCL 2.5 MG PO TABS: 2.5000 mg | ORAL_TABLET | ORAL | 0 refills | Status: DC | PRN

## 2022-08-30 NOTE — Telephone Encounter (Signed)
Called and LVM for pt to call back to move her appt sooner. NP has several cx this week that were offered.

## 2022-08-30 NOTE — Telephone Encounter (Signed)
Pt is requesting a refill for naratriptan (AMERGE) 2.5 MG tablet.  Pharmacy: [facility]

## 2022-08-30 NOTE — Telephone Encounter (Signed)
One refill given. Can we get patient in sooner than September for follow-up? Either Melissa [name] or Dr Jaynee Eagles. Patient hasn't been seen since Feb 2023.

## 2022-09-17 ENCOUNTER — Other Ambulatory Visit: Payer: Self-pay | Admitting: Family Medicine

## 2022-09-17 DIAGNOSIS — F5104 Psychophysiologic insomnia: Secondary | ICD-10-CM

## 2022-09-17 DIAGNOSIS — M797 Fibromyalgia: Secondary | ICD-10-CM

## 2022-09-17 NOTE — Telephone Encounter (Signed)
Requested medication (s) are due for refill today: yes  Requested medication (s) are on the active medication list: yes    Last refill: 08/10/22  #90  0 refills  Future visit scheduled yes 11/20/22  Notes to clinic:Not delegated, please review. Thank you.  Requested Prescriptions  Pending Prescriptions Disp Refills   methocarbamol (ROBAXIN) 750 MG tablet [Pharmacy Med Name: METHOCARBAMOL 750 MG TAB] 90 tablet 0    Sig: TAKE 1 TO 2 TABLETS BY MOUTH EVERY 8 HOURS AS NEEDED FOR MUSCLE SPASMS (FIBROMYALGIA)     Not Delegated - Analgesics:  Muscle Relaxants Failed - 09/17/2022 10:05 AM      Failed - This refill cannot be delegated      Passed - Valid encounter within last 6 months    Recent Outpatient Visits           1 month ago [place], DO   1 month ago [facility] [address], [place] W, NP   3 months ago Chronic migraine without aura without status migrainosus, not intractable   [facility] [place], Coralie Keens, NP       Future Appointments             In 2 months Baity, Coralie Keens, NP [facility], [facility]

## 2022-09-20 ENCOUNTER — Other Ambulatory Visit: Payer: Self-pay | Admitting: Neurology

## 2022-09-20 ENCOUNTER — Other Ambulatory Visit: Payer: Self-pay | Admitting: Internal Medicine

## 2022-09-20 NOTE — Telephone Encounter (Signed)
Requested medications are due for refill today.  unsure  Requested medications are on the active medications list.  no  Last refill. Medication was d/c'd 07/2022  Future visit scheduled.   yes  Notes to clinic.  Refill or refusal not delegated.    Requested Prescriptions  Pending Prescriptions Disp Refills   traMADol (ULTRAM) 50 MG tablet [Pharmacy Med Name: TRAMADOL HCL 50 MG TAB] 15 tablet     Sig: TAKE 1 TABLET BY MOUTH EVERY 8 HOURS AS NEEDED     Not Delegated - Analgesics:  Opioid Agonists Failed - 09/20/2022  9:06 AM      Failed - This refill cannot be delegated      Failed - Urine Drug Screen completed in last 360 days      Passed - Valid encounter within last 3 months    Recent Outpatient Visits           1 month ago [place], DO   1 month ago [facility] [place], [place], NP   3 months ago Chronic migraine without aura without status migrainosus, not intractable   [facility] [place], Coralie Keens, NP       Future Appointments             In 2 months Baity, Coralie Keens, NP [facility], [facility]

## 2022-09-23 DIAGNOSIS — R5382 Chronic fatigue, unspecified: Secondary | ICD-10-CM | POA: Diagnosis not present

## 2022-09-23 DIAGNOSIS — Z79899 Other long term (current) drug therapy: Secondary | ICD-10-CM | POA: Diagnosis not present

## 2022-09-23 DIAGNOSIS — G478 Other sleep disorders: Secondary | ICD-10-CM | POA: Diagnosis not present

## 2022-09-23 DIAGNOSIS — F3177 Bipolar disorder, in partial remission, most recent episode mixed: Secondary | ICD-10-CM | POA: Diagnosis not present

## 2022-09-23 DIAGNOSIS — F411 Generalized anxiety disorder: Secondary | ICD-10-CM | POA: Diagnosis not present

## 2022-09-28 ENCOUNTER — Other Ambulatory Visit: Payer: Self-pay | Admitting: Internal Medicine

## 2022-09-29 NOTE — Telephone Encounter (Signed)
Requested medication (s) are due for refill today: yes  Requested medication (s) are on the active medication list: yes  Last refill:  09/15/21  Future visit scheduled: yes  Notes to clinic:  Unable to refill per protocol, last refill by another provider not at this practice.     Requested Prescriptions  Pending Prescriptions Disp Refills   verapamil (CALAN-SR) 120 MG CR tablet [Pharmacy Med Name: VERAPAMIL HCL ER 120 MG TAB] 90 tablet 3    Sig: TAKE 1 TABLET BY MOUTH AT BEDTIME     Cardiovascular: Calcium Channel Blockers 3 Failed - 09/28/2022  5:10 PM      Failed - ALT in normal range and within 360 days    ALT  Date Value Ref Range Status  02/21/2021 12 0 - 44 U/L Final   SGPT (ALT)  Date Value Ref Range Status  06/17/2014 16 U/L Final    Comment:    14-63 NOTE: New Reference Range 01/08/14          Failed - AST in normal range and within 360 days    AST  Date Value Ref Range Status  02/21/2021 19 15 - 41 U/L Final   SGOT(AST)  Date Value Ref Range Status  06/17/2014 11 (L) 15 - 37 Unit/L Final         Failed - Cr in normal range and within 360 days    Creat  Date Value Ref Range Status  04/28/2020 0.87 0.50 - 1.05 mg/dL Final    Comment:    For patients >2 years of age, the reference limit for Creatinine is approximately 13% higher for people identified as African-American. .    Creatinine, Ser  Date Value Ref Range Status  06/22/2021 0.99 0.44 - 1.00 mg/dL Final         Passed - Last BP in normal range    BP Readings from Last 1 Encounters:  08/10/22 114/72         Passed - Last Heart Rate in normal range    Pulse Readings from Last 1 Encounters:  08/10/22 77         Passed - Valid encounter within last 6 months    Recent Outpatient Visits           1 month ago Fibromyalgia   [place] [facility] [place], Netta Neat, DO   1 month ago Fibromyalgia   [place] [facility] [place], [name], NP   3  months ago Chronic migraine without aura without status migrainosus, not intractable   [place] [facility] [place], [name], NP       Future Appointments             In 2 months Baity, [name], NP [facility], [facility]

## 2022-10-06 ENCOUNTER — Telehealth: Payer: Self-pay | Admitting: Internal Medicine

## 2022-10-06 NOTE — Telephone Encounter (Signed)
Called patient to schedule Medicare Annual Wellness Visit (AWV). Left message for patient to call back and schedule Medicare Annual Wellness Visit (AWV).  Last date of AWV: NONE  Please schedule an appointment at any time with Lorrie Barnes, LPN .  If any questions, please contact me.  Thank you ,  Erlean Mealor Harris-Coley; Care Guide Ambulatory Clinical Support [facility] Direct Dial: [phone]   

## 2022-10-08 ENCOUNTER — Ambulatory Visit (INDEPENDENT_AMBULATORY_CARE_PROVIDER_SITE_OTHER): Payer: Medicare HMO | Admitting: Internal Medicine

## 2022-10-08 ENCOUNTER — Encounter: Payer: Self-pay | Admitting: Internal Medicine

## 2022-10-08 VITALS — BP 122/67 | HR 105 | Temp 98.4°F | Ht 69.0 in | Wt 158.4 lb

## 2022-10-08 DIAGNOSIS — M797 Fibromyalgia: Secondary | ICD-10-CM

## 2022-10-08 MED ORDER — CELECOXIB 100 MG PO CAPS: 100.0000 mg | ORAL_CAPSULE | Freq: Two times a day (BID) | ORAL | 0 refills | Status: DC

## 2022-10-08 NOTE — Progress Notes (Signed)
Subjective:    Patient ID: Volney Presser, female    DOB: 1970/09/30, 52 y.o.   MRN: [id number]  HPI  Patient presents to clinic today for follow-up of fibromyalgia pain. This is a chronic issue that worsened in the last 2 months. She reports the pain seem concentrated to her arms and leg. She describes the pain as achy but can be sharp at time. She reports intermittent numbness in her fingers. This is currently managed on Gabapentin and Methocarbamol as needed but was on Cylclobenzaprine and Baclofen prior to that. She does feel like the Methocarbamol has helped. She has also tried Ibuprofen and Tylenol OTC with minimal relief of symptoms. She has failed Lyrica in the past. She thinks she may have tried Duloxetine in the past. She is walking but is not doing any stretching or yoga.  She has been under some stress lately.  Review of Systems  Past Medical History:  Diagnosis Date   Arthritis    Bipolar disorder (HCC)    Fibromyalgia    on disability   Migraine    Muscle spasm    Pain of right side of body    chronic   Panic attack    POTS (postural orthostatic tachycardia syndrome)    Syncope    recurrent   Uterine perforation    hx of    Current Outpatient Medications  Medication Sig Dispense Refill   acetaminophen (TYLENOL) 500 MG tablet Take 1,000 mg by mouth daily as needed for mild pain.     buPROPion (WELLBUTRIN XL) 150 MG 24 hr tablet Take 1 tablet (150 mg total) by mouth daily. 90 tablet 3   clonazePAM (KLONOPIN) 1 MG tablet Take 1 tablet (1 mg total) by mouth 2 (two) times daily as needed. for anxiety 60 tablet 0   fexofenadine (ALLEGRA) 180 MG tablet Take 180 mg by mouth daily.     fluvoxaMINE (LUVOX) 50 MG tablet Take 50-100 mg by mouth at bedtime.      Fremanezumab-vfrm (AJOVY) 225 MG/1.5ML SOAJ Inject 225 mg into the skin every 30 (thirty) days. 1.5 mL 11   gabapentin (NEURONTIN) 600 MG tablet Take 1 tablet (600 mg total) by mouth 3 (three) times daily. 270 tablet 1    methocarbamol (ROBAXIN) 750 MG tablet TAKE 1 TO 2 TABLETS BY MOUTH EVERY 8 HOURS AS NEEDED FOR MUSCLE SPASMS (FIBROMYALGIA) 90 tablet 0   mirtazapine (REMERON) 7.5 MG tablet Take 1 tablet (7.5 mg total) by mouth at bedtime. 30 tablet 0   naratriptan (AMERGE) 2.5 MG tablet TAKE 1 TABLET BY MOUTH AT ONSET OF MIGRAINE. IF RETURNS OR DOES NOT RESOLVE, MAY REPEAT AFTER 4 HOURS. DO NOT EXCEED 2 TABS (5 MG) IN 24 HOURS. 9 tablet 0   norgestimate-ethinyl estradiol (SPRINTEC 28) 0.25-35 MG-MCG tablet Take 1 tablet by mouth daily. 6 Package 1   omeprazole (PRILOSEC) 40 MG capsule Take 1 capsule (40 mg total) by mouth daily. 90 capsule 1   ondansetron (ZOFRAN-ODT) 8 MG disintegrating tablet Take 0.5 tablets (4 mg total) by mouth every 8 (eight) hours as needed for nausea or vomiting. 30 tablet 11   verapamil (CALAN-SR) 120 MG CR tablet TAKE 1 TABLET BY MOUTH AT BEDTIME 90 tablet 0   No current facility-administered medications for this visit.    Allergies  Allergen Reactions   Lamotrigine Hives and Swelling   Abilify [Aripiprazole] Other (See Comments)    Weight gain, did not help,    Azithromycin Rash  Depakote [Divalproex Sodium] Other (See Comments)    Hair loss   Erythromycin Base Swelling   Lithium Rash   Penicillins Rash    Has patient had a PCN reaction causing immediate rash, facial/tongue/throat swelling, SOB or lightheadedness with hypotension: Yes- swelling, rash  Has patient had a PCN reaction causing severe rash involving mucus membranes or skin necrosis: Yes Has patient had a PCN reaction that required hospitalization No Has patient had a PCN reaction occurring within the last 10 years: No If all of the above answers are "NO", then may proceed with Cephalosporin use.    Pregabalin Palpitations    Manic symptoms, no sleep x's 3 days, no appetite.   Seroquel [Quetiapine Fumarate] Other (See Comments)    Irritable   Sulfa Antibiotics Rash   Tetanus Toxoids Other (See Comments)     Swelling to injection site   Tramadol Nausea And Vomiting   Ziprasidone Hcl Other (See Comments)    Heat/cold intolerance.     Family History  Problem Relation Age of Onset   Diabetes Mother    Hypertension Mother    Brain cancer Father    Healthy Sister    Ovarian cancer Maternal Grandmother        metastasized   Lung cancer Maternal Grandfather    Cancer Paternal Grandmother        not sure of the type   Cancer Paternal Grandfather        not sure of the type   Migraines Neg Hx     Social History   Socioeconomic History   Marital status: Married    Spouse name: [name]   Number of children: 0   Years of education: some college   Highest education level: Not on file  Occupational History    Employer: OTHER    Comment: disabled  Tobacco Use   Smoking status: Never   Smokeless tobacco: Never  Vaping Use   Vaping Use: Never used  Substance and Sexual Activity   Alcohol use: No    Alcohol/week: 0.0 standard drinks of alcohol   Drug use: No   Sexual activity: Yes    Birth control/protection: Surgical    Comment: also on OCPs  Other Topics Concern   Not on file  Social History Narrative   08/28/19   From: [place], moved because her husband was from [place]   Living: with husband [name], since 1990   Work: disability due to fibromyalgia and bipolar disorder      Family: mother in law is nearby, her mother is in [place]; has a sister      Enjoys: crochet, baking, reading      Exercise: walking the dog, yoga - tries to do 3 times a week   Diet: not great, more sweets than she should      Safety   Seat belts: Yes    Guns: Yes  and secure   Safe in relationships: Yes          Right handed   Caffeine: none    Social Determinants of Health   Financial Resource Strain: Not on file  Food Insecurity: No Food Insecurity (02/11/2022)   Hunger Vital Sign    Worried About Running Out of Food in the Last Year: Never true    Ran Out of Food in the Last Year: Never true   Transportation Needs: No Transportation Needs (02/11/2022)   PRAPARE - Transportation    Lack of Transportation (Medical): No    Lack  of Transportation (Non-Medical): No  Physical Activity: Not on file  Stress: Not on file  Social Connections: Not on file  Intimate Partner Violence: Not on file     Constitutional: Denies fever, malaise, fatigue, headache or abrupt weight changes.  Respiratory: Denies difficulty breathing, shortness of breath, cough or sputum production.   Cardiovascular: Denies chest pain, chest tightness, palpitations or swelling in the hands or feet.  Musculoskeletal: Patient reports chronic muscle pain.  Denies decrease in range of motion, difficulty with gait, or joint pain and swelling.  Skin: Denies redness, rashes, lesions or ulcercations.  Neurological: Patient reports intermittent numbness in her fingertips.,  Insomnia denies dizziness, difficulty with memory, difficulty with speech or problems with balance and coordination.  Psych: Patient has a history of anxiety and depression.  Denies SI/HI.  No other specific complaints in a complete review of systems (except as listed in HPI above).     Objective:   Physical Exam  BP 122/67 (BP Location: Right Arm, Patient Position: Sitting, Cuff Size: Normal)   Pulse (!) 105   Temp 98.4 F (36.9 C) (Oral)   Ht 5\' 9"  (1.753 m)   Wt 158 lb 6.4 oz (71.8 kg)   SpO2 99%   BMI 23.39 kg/m   Wt Readings from Last 3 Encounters:  08/10/22 154 lb (69.9 kg)  08/06/22 157 lb (71.2 kg)  06/10/22 157 lb (71.2 kg)    General: Appears her stated age, well developed, well nourished in NAD. Skin: Warm, dry and intact. No rashes, lesions or ulcerations noted. Cardiovascular: Tachycardic with normal rhythm. S1,S2 noted.  No murmur, rubs or gallops noted.  Pulmonary/Chest: Normal effort and positive vesicular breath sounds. No respiratory distress. No wheezes, rales or ronchi noted.  Musculoskeletal: She has multiple tender  spots of her upper and lower extremities.  Shoulder shrug equal.  Strength 4/5 BUE/BLE.  No difficulty with gait.  Neurological: Alert and oriented.  Psychiatric: Mood and affect normal. Behavior is normal. Judgment and thought content normal.    BMET    Component Value Date/Time   NA 138 06/22/2021 0730   NA 141 06/17/2014 1511   K 4.0 06/22/2021 0730   K 4.0 06/17/2014 1511   CL 103 06/22/2021 0730   CL 109 (H) 06/17/2014 1511   CO2 28 06/22/2021 0730   CO2 23 06/17/2014 1511   GLUCOSE 95 06/22/2021 0730   GLUCOSE 88 06/17/2014 1511   BUN 13 06/22/2021 0730   BUN 11 06/17/2014 1511   CREATININE 0.99 06/22/2021 0730   CREATININE 0.87 04/28/2020 0905   CALCIUM 8.5 (L) 06/22/2021 0730   CALCIUM 8.4 (L) 06/17/2014 1511   GFRNONAA >60 06/22/2021 0730   GFRNONAA 78 04/28/2020 0905   GFRAA 90 04/28/2020 0905    Lipid Panel     Component Value Date/Time   CHOL 245 (H) 06/24/2016 0852   CHOL 203 (H) 02/04/2012 0540   TRIG 330 (H) 03/03/2020 1304   TRIG 300 (H) 02/04/2012 0540   HDL 40.20 06/24/2016 0852   HDL 29 (L) 02/04/2012 0540   CHOLHDL 6 06/24/2016 0852   VLDL 72.6 (H) 06/24/2016 0852   VLDL 60 (H) 02/04/2012 0540   LDLCALC 114 (H) 02/04/2012 0540    CBC    Component Value Date/Time   WBC 9.3 09/17/2021 1059   RBC 4.44 09/17/2021 1059   HGB 13.0 09/17/2021 1059   HGB 14.9 06/17/2014 1511   HCT 38.5 09/17/2021 1059   HCT 46.4 06/17/2014 1511   PLT  275 09/17/2021 1059   PLT 290 06/17/2014 1511   MCV 86.7 09/17/2021 1059   MCV 85 06/17/2014 1511   MCH 29.3 09/17/2021 1059   MCHC 33.8 09/17/2021 1059   RDW 12.5 09/17/2021 1059   RDW 14.7 (H) 06/17/2014 1511   LYMPHSABS 1,274 09/17/2021 1059   LYMPHSABS 4.2 (H) 11/05/2012 0547   MONOABS 0.5 02/21/2021 0445   MONOABS 1.0 (H) 11/05/2012 0547   EOSABS 102 09/17/2021 1059   EOSABS 0.1 11/05/2012 0547   BASOSABS 112 09/17/2021 1059   BASOSABS 0.1 11/05/2012 0547    Hgb A1C Lab Results  Component Value  Date   HGBA1C 5.2 04/28/2020            Assessment & Plan:   RTC in 2 months for your annual exam [name], NP

## 2022-10-08 NOTE — Patient Instructions (Signed)
Myofascial Pain Syndrome and Fibromyalgia Myofascial pain syndrome and fibromyalgia are both pain disorders. You may feel this pain mainly in your muscles. Myofascial pain syndrome: Always has tender points in the muscles that will cause pain when pressed (trigger points). The pain may come and go. Usually affects your neck, upper back, and shoulder areas. The pain often moves into your arms and hands. Fibromyalgia: Has muscle pains and tenderness that come and go. Is often associated with tiredness (fatigue) and sleep problems. Has trigger points. Tends to be long-lasting (chronic), but is not life-threatening. Fibromyalgia and myofascial pain syndrome are not the same. However, they often occur together. If you have both conditions, each can make the other worse. Both are common and can cause enough pain and fatigue to make day-to-day activities difficult. Both can be hard to diagnose because their symptoms are common in many other conditions. What are the causes? The exact causes of these conditions are not known. What increases the risk? You are more likely to develop either of these conditions if: You have a family history of the condition. You are female. You have certain triggers, such as: Spine disorders. An injury (trauma) or other physical stressors. Being under a lot of stress. Medical conditions such as osteoarthritis, rheumatoid arthritis, or lupus. What are the signs or symptoms? Fibromyalgia The main symptom of fibromyalgia is widespread pain and tenderness in your muscles. Pain is sometimes described as stabbing, shooting, or burning. You may also have: Tingling or numbness. Sleep problems and fatigue. Problems with attention and concentration (fibro fog). Other symptoms may include: Bowel and bladder problems. Headaches. Vision problems. Sensitivity to odors and noises. Depression or mood changes. Painful menstrual periods (dysmenorrhea). Dry skin or eyes. These  symptoms can vary over time. Myofascial pain syndrome Symptoms of myofascial pain syndrome include: Tight, ropy bands of muscle. Uncomfortable sensations in muscle areas. These may include aching, cramping, burning, numbness, tingling, and weakness. Difficulty moving certain parts of the body freely (poor range of motion). How is this diagnosed? This condition may be diagnosed by your symptoms and medical history. You will also have a physical exam. In general: Fibromyalgia is diagnosed if you have pain, fatigue, and other symptoms for more than 3 months, and symptoms cannot be explained by another condition. Myofascial pain syndrome is diagnosed if you have trigger points in your muscles, and those trigger points are tender and cause pain elsewhere in your body (referred pain). How is this treated? Treatment for these conditions depends on the type that you have. For fibromyalgia, a healthy lifestyle is the most important treatment including aerobic and strength exercises. Different types of medicines are used to help treat pain and include: NSAIDs. Medicines for treating depression. Medicines that help control seizures. Medicines that relax the muscles. Treatment for myofascial pain syndrome includes: Pain medicines, such as NSAIDs. Cooling and stretching of muscles. Massage therapy with myofascial release technique. Trigger point injections. Treating these conditions often requires a team of health care providers. These may include: Your primary care provider. A physical therapist. Complementary health care providers, such as massage therapists or acupuncturists. A psychiatrist for cognitive behavioral therapy. Follow these instructions at home: Medicines Take over-the-counter and prescription medicines only as told by your health care provider. Ask your health care provider if the medicine prescribed to you: Requires you to avoid driving or using machinery. Can cause constipation.  You may need to take these actions to prevent or treat constipation: Drink enough fluid to keep your urine pale   yellow. Take over-the-counter or prescription medicines. Eat foods that are high in fiber, such as beans, whole grains, and fresh fruits and vegetables. Limit foods that are high in fat and processed sugars, such as fried or sweet foods. Lifestyle  Do exercises as told by your health care provider or physical therapist. Practice relaxation techniques to control your stress. You may want to try: Biofeedback. Visual imagery. Hypnosis. Muscle relaxation. Yoga. Meditation. Maintain a healthy lifestyle. This includes eating a healthy diet and getting enough sleep. Do not use any products that contain nicotine or tobacco. These products include cigarettes, chewing tobacco, and vaping devices, such as e-cigarettes. If you need help quitting, ask your health care provider. General instructions Talk to your health care provider about complementary treatments, such as acupuncture or massage. Do not do activities that stress or strain your muscles. This includes repetitive motions and heavy lifting. Keep all follow-up visits. This is important. Where to find support Consider joining a support group with others who are diagnosed with this condition. National Fibromyalgia Association: fmaware.org Where to find more information U.S. Pain Foundation: uspainfoundation.org Contact a health care provider if: You have new symptoms. Your symptoms get worse or your pain is severe. You have side effects from your medicines. You have trouble sleeping. Your condition is causing depression or anxiety. Get help right away if: You have thoughts of hurting yourself or others. Get help right away if you feel like you may hurt yourself or others, or have thoughts about taking your own life. Go to your nearest emergency room or: Call 911. Call the National Suicide Prevention Lifeline at 1-800-273-8255  or 988. This is open 24 hours a day. Text the Crisis Text Line at 741741. This information is not intended to replace advice given to you by your health care provider. Make sure you discuss any questions you have with your health care provider. Document Revised: 03/15/2022 Document Reviewed: 05/08/2021 Elsevier Patient Education  2023 Elsevier Inc.  

## 2022-10-08 NOTE — Assessment & Plan Note (Signed)
Deteriorated over the last few months Continue methocarbamol and gabapentin We will add Celebrex 100 mg twice daily, stop ibuprofen OTC Can take Tylenol 1000 mg every 6 hours Encourage regular stretching or yoga Consider adding duloxetine versus changing gabapentin to Lyrica if symptoms persist

## 2022-10-19 ENCOUNTER — Other Ambulatory Visit: Payer: Self-pay

## 2022-10-19 ENCOUNTER — Emergency Department: Payer: Medicare HMO

## 2022-10-19 ENCOUNTER — Encounter: Payer: Self-pay | Admitting: Emergency Medicine

## 2022-10-19 ENCOUNTER — Emergency Department
Admission: EM | Admit: 2022-10-19 | Discharge: 2022-10-19 | Disposition: A | Discharge status: Home / Self Care | Payer: Medicare HMO | Attending: Emergency Medicine | Admitting: Emergency Medicine

## 2022-10-19 DIAGNOSIS — X58XXXA Exposure to other specified factors, initial encounter: Secondary | ICD-10-CM | POA: Insufficient documentation

## 2022-10-19 DIAGNOSIS — M545 Low back pain, unspecified: Secondary | ICD-10-CM | POA: Diagnosis not present

## 2022-10-19 DIAGNOSIS — S39012A Strain of muscle, fascia and tendon of lower back, initial encounter: Secondary | ICD-10-CM

## 2022-10-19 DIAGNOSIS — M47816 Spondylosis without myelopathy or radiculopathy, lumbar region: Secondary | ICD-10-CM | POA: Diagnosis not present

## 2022-10-19 DIAGNOSIS — Y92007 Garden or yard of unspecified non-institutional (private) residence as the place of occurrence of the external cause: Secondary | ICD-10-CM | POA: Insufficient documentation

## 2022-10-19 DIAGNOSIS — Y93H2 Activity, gardening and landscaping: Secondary | ICD-10-CM | POA: Diagnosis not present

## 2022-10-19 MED ORDER — OXYCODONE-ACETAMINOPHEN 5-325 MG PO TABS
1.0000 | ORAL_TABLET | Freq: Once | ORAL | Status: AC
  Administered 2022-10-19: 1 via ORAL
  Filled 2022-10-19: qty 1

## 2022-10-19 MED ORDER — METHOCARBAMOL 500 MG PO TABS: 500.0000 mg | ORAL_TABLET | Freq: Four times a day (QID) | ORAL | 0 refills | Status: DC

## 2022-10-19 MED ORDER — OXYCODONE-ACETAMINOPHEN 5-325 MG PO TABS: 1.0000 | ORAL_TABLET | Freq: Four times a day (QID) | ORAL | 0 refills | Status: DC | PRN

## 2022-10-19 MED ORDER — KETOROLAC TROMETHAMINE 30 MG/ML IJ SOLN
30.0000 mg | Freq: Once | INTRAMUSCULAR | Status: AC
  Administered 2022-10-19: 30 mg via INTRAMUSCULAR
  Filled 2022-10-19: qty 1

## 2022-10-19 MED ORDER — METHOCARBAMOL 500 MG PO TABS
500.0000 mg | ORAL_TABLET | Freq: Once | ORAL | Status: AC
  Administered 2022-10-19: 500 mg via ORAL
  Filled 2022-10-19: qty 1

## 2022-10-19 MED ORDER — ETODOLAC 400 MG PO TABS: 400.0000 mg | ORAL_TABLET | Freq: Two times a day (BID) | ORAL | 0 refills | Status: DC

## 2022-10-19 NOTE — ED Notes (Signed)
68 yof with a c/c of right sided leg pain since Sunday. The pt advised she has two falls as a result of the pain. The pt advised she does have a hx of a pinched nerve in her neck. The pt was warm, pink, and dry. The pt is alert and oriented x 4. The pt's smile was equal, grip strength weaker ion right, and equal bilateral leg lift.

## 2022-10-19 NOTE — Discharge Instructions (Signed)
Follow-up with your primary care provider if any continued problems or concerns.  A prescription for the anti-inflammatory, muscle relaxant and pain medication was sent to your pharmacy.  Take only as directed.  As we discussed the pain medication and muscle relaxant could cause drowsiness and increase your risk for injury.  Ice or heat to your back as needed for discomfort.

## 2022-10-19 NOTE — ED Triage Notes (Signed)
Patient ambulatory to triage with steady gait, without difficulty or distress noted; pt reports began having lower back pain on Sunday after working outside; denies any specific injury; last night pain began shooting down rt leg and cause her to fall twice; denies hx of back issues

## 2022-10-19 NOTE — ED Provider Notes (Signed)
[facility] Provider Note    None    (approximate)   History   Back Pain   HPI  Melissa [name] is a 52 y.o. female   presents to the ED with complaint of low back pain that started Sunday after working in the yard and planting flowers.  Patient denies any specific injury to her back.  She states that the pain got worse last evening with radiation into her right leg.  She denies any previous injury to her back and no urinary symptoms.  Patient reports that she did take one of her husbands tramadol without any relief.  Patient has a history of bipolar disorder, fibromyalgia, muscle spasms, POTS, and migraine.      Physical Exam   Triage Vital Signs: ED Triage Vitals  Enc Vitals Group     BP 10/19/22 0332 138/68     Pulse Rate 10/19/22 0332 98     Resp 10/19/22 0332 18     Temp 10/19/22 0332 98.3 F (36.8 C)     Temp Source 10/19/22 0332 Oral     SpO2 10/19/22 0332 99 %     Weight 10/19/22 0331 155 lb (70.3 kg)     Height 10/19/22 0331 5\' 9"  (1.753 m)     Head Circumference --      Peak Flow --      Pain Score 10/19/22 0331 7     Pain Loc --      Pain Edu? --      Excl. in GC? --     Most recent vital signs: Vitals:   10/19/22 0332  BP: 138/68  Pulse: 98  Resp: 18  Temp: 98.3 F (36.8 C)  SpO2: 99%     General: Awake, no distress.  CV:  Good peripheral perfusion.  Heart regular rate and rhythm. Resp:  Normal effort.  Lungs are clear bilaterally. Abd:  No distention.  Other:  Tenderness is noted on palpation of the lower lumbar and sacral area.  Right paravertebral and right SI joint muscle area is moderately tender.  Increased tenderness with movement of the right lower extremity.  Good muscle strength bilaterally at 5/5.  Patient is able to stand and ambulate without any assistance and gait is slow and guarded.   ED Results / Procedures / Treatments   Labs (all labs ordered are listed, but only abnormal results are displayed) Labs  Reviewed - No data to display    RADIOLOGY  Lumbar spine x-ray images were reviewed and interpreted by myself independent of the radiologist and was negative for fracture but did show some degenerative changes.   PROCEDURES:  Critical Care performed:   Procedures   MEDICATIONS ORDERED IN ED: Medications  ketorolac (TORADOL) 30 MG/ML injection 30 mg (30 mg Intramuscular Given 10/19/22 0752)  methocarbamol (ROBAXIN) tablet 500 mg (500 mg Oral Given 10/19/22 0751)  oxyCODONE-acetaminophen (PERCOCET/ROXICET) 5-325 MG per tablet 1 tablet (1 tablet Oral Given 10/19/22 0752)     IMPRESSION / MDM / ASSESSMENT AND PLAN / ED COURSE  I reviewed the triage vital signs and the nursing notes.   Differential diagnosis includes, but is not limited to, compression fracture, muscle skeletal strain, degenerative disc disease, muscle spasms.  52 year old female presents to the ED with complaint of low back pain with radiation into her right lower extremity without direct trauma to her back.  X-rays noted above were reassuring and patient was made aware.  Prior to x-ray patient was given  Toradol 30 mg IM, Percocet 5 mg and methocarbamol for 500 mg p.o.  Patient was able to move more easily and with less pain prior to discharge.  She was encouraged to use ice or heat to her back as needed for discomfort and to follow-up with her PCP if any continued problems.  Was made aware that the combination of the muscle relaxant and pain medication could cause increased drowsiness and increase for injury.      Patient's presentation is most consistent with acute complicated illness / injury requiring diagnostic workup.  FINAL CLINICAL IMPRESSION(S) / ED DIAGNOSES   Final diagnoses:  Lumbar strain, initial encounter     Rx / DC Orders   ED Discharge Orders          Ordered    etodolac (LODINE) 400 MG tablet  2 times daily        10/19/22 0913    methocarbamol (ROBAXIN) 500 MG tablet  4 times daily         10/19/22 0913    oxyCODONE-acetaminophen (PERCOCET) 5-325 MG tablet  Every 6 hours PRN        10/19/22 0913             Note:  This document was prepared using Dragon voice recognition software and may include unintentional dictation errors.   [name], PA-C 10/19/22 1302    [name], MD 10/19/22 [phone]

## 2022-10-29 ENCOUNTER — Telehealth: Payer: Self-pay

## 2022-10-29 NOTE — Telephone Encounter (Signed)
Transition Care Management Unsuccessful Follow-up Telephone Call  Date of discharge and from where:  [place] 4/30  Attempts:  1st Attempt  Reason for unsuccessful TCM follow-up call:  Left voice message   Melissa [name] [facility], [place] [phone] [address], [place], [place] [address] Phone: [phone] Email: [email or web address]       

## 2022-10-29 NOTE — Telephone Encounter (Signed)
Transition Care Management Unsuccessful Follow-up Telephone Call  Date of discharge and from where:  [place] 4/30  Attempts:  2nd Attempt  Reason for unsuccessful TCM follow-up call:  Left voice message   Melissa [name] [facility] Guide, [facility] [phone] [address] [place], [place], [place] [address] Phone: [phone] Email: [email or web address]

## 2022-11-04 ENCOUNTER — Other Ambulatory Visit: Payer: Self-pay | Admitting: Internal Medicine

## 2022-11-04 MED ORDER — METHOCARBAMOL 750 MG PO TABS: 750.0000 mg | ORAL_TABLET | Freq: Three times a day (TID) | ORAL | 0 refills | Status: DC | PRN

## 2022-11-04 NOTE — Telephone Encounter (Signed)
Requested medication (s) are due for refill today - unsure  Requested medication (s) are on the active medication list -yes  Future visit scheduled - yes  Last refill: 10/19/22 #20  Notes to clinic: non delegated Rx  Requested Prescriptions  Pending Prescriptions Disp Refills   methocarbamol (ROBAXIN) 750 MG tablet [Pharmacy Med Name: METHOCARBAMOL 750 MG TAB] 90 tablet     Sig: TAKE 1 TO 2 TABLETS BY MOUTH EVERY 8 HOURS AS NEEDED FOR MUSCLE SPASMS (FIBROMYALGIA)     Not Delegated - Analgesics:  Muscle Relaxants Failed - 11/04/2022  9:41 AM      Failed - This refill cannot be delegated      Passed - Valid encounter within last 6 months    Recent Outpatient Visits           3 weeks ago Fibromyalgia   [place] [facility], [name], NP   2 months ago Fibromyalgia   [place] [facility] [name], DO   3 months ago Fibromyalgia   [place] [facility] [place], [name], NP   4 months ago Chronic migraine without aura without status migrainosus, not intractable   [place] [facility] [place], [name], NP       Future Appointments             In 1 month [place], [name], NP [facility], [facility]               Requested Prescriptions  Pending Prescriptions Disp Refills   methocarbamol (ROBAXIN) 750 MG tablet [Pharmacy Med Name: METHOCARBAMOL 750 MG TAB] 90 tablet     Sig: TAKE 1 TO 2 TABLETS BY MOUTH EVERY 8 HOURS AS NEEDED FOR MUSCLE SPASMS (FIBROMYALGIA)     Not Delegated - Analgesics:  Muscle Relaxants Failed - 11/04/2022  9:41 AM      Failed - This refill cannot be delegated      Passed - Valid encounter within last 6 months    Recent Outpatient Visits           3 weeks ago Fibromyalgia   [place] [facility] [place], [name], NP   2 months ago Fibromyalgia   [place] [facility] [name], DO    3 months ago Fibromyalgia   [place] [facility] [place], [name], NP   4 months ago Chronic migraine without aura without status migrainosus, not intractable   [place] [facility] [place], [name], NP       Future Appointments             In 1 month [place], [name], NP [facility], [facility]

## 2022-11-06 ENCOUNTER — Other Ambulatory Visit: Payer: Self-pay | Admitting: Neurology

## 2022-12-10 ENCOUNTER — Encounter: Payer: Self-pay | Admitting: Internal Medicine

## 2022-12-10 ENCOUNTER — Ambulatory Visit (INDEPENDENT_AMBULATORY_CARE_PROVIDER_SITE_OTHER): Payer: Medicare HMO | Admitting: Internal Medicine

## 2022-12-10 VITALS — BP 116/78 | HR 98 | Temp 95.7°F | Ht 69.0 in | Wt 161.0 lb

## 2022-12-10 DIAGNOSIS — L659 Nonscarring hair loss, unspecified: Secondary | ICD-10-CM | POA: Diagnosis not present

## 2022-12-10 DIAGNOSIS — E559 Vitamin D deficiency, unspecified: Secondary | ICD-10-CM

## 2022-12-10 DIAGNOSIS — Z0001 Encounter for general adult medical examination with abnormal findings: Secondary | ICD-10-CM

## 2022-12-10 DIAGNOSIS — E785 Hyperlipidemia, unspecified: Secondary | ICD-10-CM | POA: Diagnosis not present

## 2022-12-10 DIAGNOSIS — E538 Deficiency of other specified B group vitamins: Secondary | ICD-10-CM | POA: Diagnosis not present

## 2022-12-10 DIAGNOSIS — R7309 Other abnormal glucose: Secondary | ICD-10-CM

## 2022-12-10 LAB — COMPLETE METABOLIC PANEL WITH GFR
ALT: 15 U/L (ref 6–29)
AST: 15 U/L (ref 10–35)
BUN/Creatinine Ratio: 9 (calc) (ref 6–22)
Chloride: 106 mmol/L (ref 98–110)
Total Bilirubin: 0.3 mg/dL (ref 0.2–1.2)

## 2022-12-10 LAB — VITAMIN B12: Vitamin B-12: 452 pg/mL (ref 200–1100)

## 2022-12-10 LAB — VITAMIN D 25 HYDROXY (VIT D DEFICIENCY, FRACTURES): Vit D, 25-Hydroxy: 39 ng/mL (ref 30–100)

## 2022-12-10 LAB — LIPID PANEL
Cholesterol: 287 mg/dL — ABNORMAL HIGH (ref <200)
HDL: 58 mg/dL (ref >=50)
LDL Cholesterol (Calc): 179 mg/dL (calc) — ABNORMAL HIGH
Total CHOL/HDL Ratio: 4.9 (calc) (ref <5.0)

## 2022-12-10 LAB — CBC
Hemoglobin: 13.5 g/dL (ref 11.7–15.5)
MCHC: 33.3 g/dL (ref 32.0–36.0)
MPV: 11.1 fL (ref 7.5–12.5)
Platelets: 285 10*3/uL (ref 140–400)
RBC: 4.67 10*6/uL (ref 3.80–5.10)
WBC: 6.1 10*3/uL (ref 3.8–10.8)

## 2022-12-10 NOTE — Progress Notes (Signed)
Subjective:    Patient ID: Melissa [name], female    DOB: [birth year], 52 y.o.   MRN: [id number]  HPI  Patient presents to clinic today for her annual exam.  Flu: Never Tetanus: 11/2011 COVID: Never Shingrix: Never Pap smear: 07/2019 Mammogram: 09/2021 Colon screening: 02/2021, Cologuard Vision screening: annually Dentist: biannually  Diet: She does eat meat. She consumes more fruits than veggies. She tries to avoid fried foods. She drinks mostly flavored water, 2 sodas Exercise: None  Review of Systems     Past Medical History:  Diagnosis Date   Arthritis    Bipolar disorder (HCC)    Fibromyalgia    on disability   Migraine    Muscle spasm    Pain of right side of body    chronic   Panic attack    POTS (postural orthostatic tachycardia syndrome)    Syncope    recurrent   Uterine perforation    hx of    Current Outpatient Medications  Medication Sig Dispense Refill   acetaminophen (TYLENOL) 500 MG tablet Take 1,000 mg by mouth daily as needed for mild pain.     buPROPion (WELLBUTRIN XL) 150 MG 24 hr tablet Take 1 tablet (150 mg total) by mouth daily. 90 tablet 3   celecoxib (CELEBREX) 100 MG capsule Take 1 capsule (100 mg total) by mouth 2 (two) times daily. 180 capsule 0   clonazePAM (KLONOPIN) 1 MG tablet Take 1 tablet (1 mg total) by mouth 2 (two) times daily as needed. for anxiety 60 tablet 0   etodolac (LODINE) 400 MG tablet Take 1 tablet (400 mg total) by mouth 2 (two) times daily. 20 tablet 0   fexofenadine (ALLEGRA) 180 MG tablet Take 180 mg by mouth daily.     fluvoxaMINE (LUVOX) 50 MG tablet Take 50-100 mg by mouth at bedtime.      Fremanezumab-vfrm (AJOVY) 225 MG/1.5ML SOAJ Inject 225 mg into the skin every 30 (thirty) days. 1.5 mL 11   gabapentin (NEURONTIN) 600 MG tablet Take 1 tablet (600 mg total) by mouth 3 (three) times daily. 270 tablet 1   methocarbamol (ROBAXIN) 750 MG tablet Take 1 tablet (750 mg total) by mouth every 8 (eight) hours as needed for  muscle spasms. 90 tablet 0   mirtazapine (REMERON) 7.5 MG tablet Take 1 tablet (7.5 mg total) by mouth at bedtime. 30 tablet 0   naratriptan (AMERGE) 2.5 MG tablet TAKE 1 TABLET BY MOUTH AT ONSET OF MIGRAINE. IF RETURNS OR DOES NOT RESOLVE, MAY REPEAT AFTER 4 HOURS. DO NOT EXCEED 2 TABS (5 MG) IN 24 HOURS. 9 tablet 3   norgestimate-ethinyl estradiol (SPRINTEC 28) 0.25-35 MG-MCG tablet Take 1 tablet by mouth daily. 6 Package 1   omeprazole (PRILOSEC) 40 MG capsule Take 1 capsule (40 mg total) by mouth daily. 90 capsule 1   ondansetron (ZOFRAN-ODT) 8 MG disintegrating tablet Take 0.5 tablets (4 mg total) by mouth every 8 (eight) hours as needed for nausea or vomiting. 30 tablet 11   oxyCODONE-acetaminophen (PERCOCET) 5-325 MG tablet Take 1 tablet by mouth every 6 (six) hours as needed for severe pain. 15 tablet 0   verapamil (CALAN-SR) 120 MG CR tablet TAKE 1 TABLET BY MOUTH AT BEDTIME 90 tablet 0   No current facility-administered medications for this visit.    Allergies  Allergen Reactions   Lamotrigine Hives and Swelling   Abilify [Aripiprazole] Other (See Comments)    Weight gain, did not help,    Azithromycin  Rash   Depakote [Divalproex Sodium] Other (See Comments)    Hair loss   Erythromycin Base Swelling   Lithium Rash   Penicillins Rash    Has patient had a PCN reaction causing immediate rash, facial/tongue/throat swelling, SOB or lightheadedness with hypotension: Yes- swelling, rash  Has patient had a PCN reaction causing severe rash involving mucus membranes or skin necrosis: Yes Has patient had a PCN reaction that required hospitalization No Has patient had a PCN reaction occurring within the last 10 years: No If all of the above answers are "NO", then may proceed with Cephalosporin use.    Pregabalin Palpitations    Manic symptoms, no sleep x's 3 days, no appetite.   Seroquel [Quetiapine Fumarate] Other (See Comments)    Irritable   Sulfa Antibiotics Rash   Tetanus Toxoids  Other (See Comments)    Swelling to injection site   Tramadol Nausea And Vomiting   Ziprasidone Hcl Other (See Comments)    Heat/cold intolerance.     Family History  Problem Relation Age of Onset   Diabetes Mother    Hypertension Mother    Brain cancer Father    Healthy Sister    Ovarian cancer Maternal Grandmother        metastasized   Lung cancer Maternal Grandfather    Cancer Paternal Grandmother        not sure of the type   Cancer Paternal Grandfather        not sure of the type   Migraines Neg Hx     Social History   Socioeconomic History   Marital status: Married    Spouse name: [name]   Number of children: 0   Years of education: some college   Highest education level: Not on file  Occupational History    Employer: OTHER    Comment: disabled  Tobacco Use   Smoking status: Never   Smokeless tobacco: Never  Vaping Use   Vaping Use: Never used  Substance and Sexual Activity   Alcohol use: No    Alcohol/week: 0.0 standard drinks of alcohol   Drug use: No   Sexual activity: Yes    Birth control/protection: Surgical    Comment: also on OCPs  Other Topics Concern   Not on file  Social History Narrative   08/28/19   From: [place], moved because her husband was from [place]   Living: with husband [name], since 1990   Work: disability due to fibromyalgia and bipolar disorder      Family: mother in law is nearby, her mother is in [place]; has a sister      Enjoys: crochet, baking, reading      Exercise: walking the dog, yoga - tries to do 3 times a week   Diet: not great, more sweets than she should      Safety   Seat belts: Yes    Guns: Yes  and secure   Safe in relationships: Yes          Right handed   Caffeine: none    Social Determinants of Health   Financial Resource Strain: Not on file  Food Insecurity: No Food Insecurity (02/11/2022)   Hunger Vital Sign    Worried About Running Out of Food in the Last Year: Never true    Ran Out of Food in the  Last Year: Never true  Transportation Needs: No Transportation Needs (02/11/2022)   PRAPARE - Administrator, Civil Service (Medical): No  Lack of Transportation (Non-Medical): No  Physical Activity: Not on file  Stress: Not on file  Social Connections: Not on file  Intimate Partner Violence: Not on file     Constitutional: Patient reports intermittent headaches, fatigue.  Denies fever, malaise, fatigue, or abrupt weight changes.  HEENT: Pt reports thinning hair. Denies eye pain, eye redness, ear pain, ringing in the ears, wax buildup, runny nose, nasal congestion, bloody nose, or sore throat. Respiratory: Denies difficulty breathing, shortness of breath, cough or sputum production.   Cardiovascular: Denies chest pain, chest tightness, palpitations or swelling in the hands or feet.  Gastrointestinal: Patient reports alternating constipation and diarrhea. Denies abdominal pain, bloating, or blood in the stool.  GU: Pt reports irregular periods. Denies urgency, frequency, pain with urination, burning sensation, blood in urine, odor or discharge. Musculoskeletal: Patient reports chronic muscle and joint pain.  Denies decrease in range of motion, difficulty with gait, or joint swelling.  Skin: Denies redness, rashes, lesions or ulcercations.  Neurological: Patient reports insomnia.  Denies dizziness, difficulty with memory, difficulty with speech or problems with balance and coordination.  Psych: Patient has a history of anxiety and depression.  Denies SI/HI.  No other specific complaints in a complete review of systems (except as listed in HPI above).  Objective:   Physical Exam BP 116/78 (BP Location: Left Arm, Patient Position: Sitting, Cuff Size: Normal)   Pulse 98   Temp (!) 95.7 F (35.4 C) (Temporal)   Ht 5\' 9"  (1.753 m)   Wt 161 lb (73 kg)   SpO2 100%   BMI 23.78 kg/m   Wt Readings from Last 3 Encounters:  10/19/22 155 lb (70.3 kg)  10/08/22 158 lb 6.4 oz (71.8  kg)  08/10/22 154 lb (69.9 kg)    General: Appears her stated age, well developed, well nourished in NAD. Skin: Warm, dry and intact.  HEENT: Head: normal shape and size; Eyes: sclera white, no icterus, conjunctiva pink, PERRLA and EOMs intact;  Neck:  Neck supple, trachea midline. No masses, lumps or thyromegaly present.  Cardiovascular: Normal rate and rhythm. S1,S2 noted.  No murmur, rubs or gallops noted. No JVD or BLE edema. No carotid bruits noted. Pulmonary/Chest: Normal effort and positive vesicular breath sounds. No respiratory distress. No wheezes, rales or ronchi noted.  Abdomen: Normal bowel sounds. Musculoskeletal: Strength 5/5 BUE./BLE. No difficulty with gait.  Neurological: Alert and oriented. Cranial nerves II-XII grossly intact. Coordination normal.  Psychiatric: Mood and affect normal. Behavior is normal. Judgment and thought content normal.    BMET    Component Value Date/Time   NA 138 06/22/2021 0730   NA 141 06/17/2014 1511   K 4.0 06/22/2021 0730   K 4.0 06/17/2014 1511   CL 103 06/22/2021 0730   CL 109 (H) 06/17/2014 1511   CO2 28 06/22/2021 0730   CO2 23 06/17/2014 1511   GLUCOSE 95 06/22/2021 0730   GLUCOSE 88 06/17/2014 1511   BUN 13 06/22/2021 0730   BUN 11 06/17/2014 1511   CREATININE 0.99 06/22/2021 0730   CREATININE 0.87 04/28/2020 0905   CALCIUM 8.5 (L) 06/22/2021 0730   CALCIUM 8.4 (L) 06/17/2014 1511   GFRNONAA >60 06/22/2021 0730   GFRNONAA 78 04/28/2020 0905   GFRAA 90 04/28/2020 0905    Lipid Panel     Component Value Date/Time   CHOL 245 (H) 06/24/2016 0852   CHOL 203 (H) 02/04/2012 0540   TRIG 330 (H) 03/03/2020 1304   TRIG 300 (H) 02/04/2012 0540  HDL 40.20 06/24/2016 0852   HDL 29 (L) 02/04/2012 0540   CHOLHDL 6 06/24/2016 0852   VLDL 72.6 (H) 06/24/2016 0852   VLDL 60 (H) 02/04/2012 0540   LDLCALC 114 (H) 02/04/2012 0540    CBC    Component Value Date/Time   WBC 9.3 09/17/2021 1059   RBC 4.44 09/17/2021 1059   HGB  13.0 09/17/2021 1059   HGB 14.9 06/17/2014 1511   HCT 38.5 09/17/2021 1059   HCT 46.4 06/17/2014 1511   PLT 275 09/17/2021 1059   PLT 290 06/17/2014 1511   MCV 86.7 09/17/2021 1059   MCV 85 06/17/2014 1511   MCH 29.3 09/17/2021 1059   MCHC 33.8 09/17/2021 1059   RDW 12.5 09/17/2021 1059   RDW 14.7 (H) 06/17/2014 1511   LYMPHSABS 1,274 09/17/2021 1059   LYMPHSABS 4.2 (H) 11/05/2012 0547   MONOABS 0.5 02/21/2021 0445   MONOABS 1.0 (H) 11/05/2012 0547   EOSABS 102 09/17/2021 1059   EOSABS 0.1 11/05/2012 0547   BASOSABS 112 09/17/2021 1059   BASOSABS 0.1 11/05/2012 0547    Hgb A1C Lab Results  Component Value Date   HGBA1C 5.2 04/28/2020            Assessment & Plan:   Preventative health maintenance:  Encouraged her to get a flu shot in the fall Tetanus UTD Encouraged her to get her COVID-vaccine Discussed Shingrix vaccine, she will check coverage with her insurance company and schedule visit she would like to have this done Pap smear UTD Mammogram ordered by gyn Colon screening UTD Encouraged her to consume a balanced diet and exercise regimen Asked her to see an eye doctor and dentist annually We will check CBC, c-Met, lipid, A1c today  Thinning hair, Vit d deficiency, Vit b 12 deficiency:  Will check TSH, vitamin D and B12 today  RTC in 6 months, follow-up chronic conditions [name], NP

## 2022-12-10 NOTE — Patient Instructions (Signed)

## 2022-12-11 LAB — COMPLETE METABOLIC PANEL WITH GFR
AG Ratio: 1.6 (calc) (ref 1.0–2.5)
Albumin: 4 g/dL (ref 3.6–5.1)
Alkaline phosphatase (APISO): 60 U/L (ref 37–153)
BUN: 10 mg/dL (ref 7–25)
CO2: 24 mmol/L (ref 20–32)
Calcium: 8.7 mg/dL (ref 8.6–10.4)
Creat: 1.15 mg/dL — ABNORMAL HIGH (ref 0.50–1.03)
Globulin: 2.5 g/dL (calc) (ref 1.9–3.7)
Glucose, Bld: 96 mg/dL (ref 65–99)
Potassium: 3.8 mmol/L (ref 3.5–5.3)
Sodium: 139 mmol/L (ref 135–146)
Total Protein: 6.5 g/dL (ref 6.1–8.1)
eGFR: 57 mL/min/{1.73_m2} — ABNORMAL LOW (ref >=60)

## 2022-12-11 LAB — TSH: TSH: 1.04 mIU/L

## 2022-12-11 LAB — LIPID PANEL
Non-HDL Cholesterol (Calc): 229 mg/dL (calc) — ABNORMAL HIGH (ref <130)
Triglycerides: 296 mg/dL — ABNORMAL HIGH (ref <150)

## 2022-12-11 LAB — CBC
HCT: 40.6 % (ref 35.0–45.0)
MCH: 28.9 pg (ref 27.0–33.0)
MCV: 86.9 fL (ref 80.0–100.0)
RDW: 13.3 % (ref 11.0–15.0)

## 2022-12-11 LAB — HEMOGLOBIN A1C
Hgb A1c MFr Bld: 5.5 % of total Hgb (ref <5.7)
Mean Plasma Glucose: 111 mg/dL
eAG (mmol/L): 6.2 mmol/L

## 2022-12-15 ENCOUNTER — Other Ambulatory Visit: Payer: Self-pay | Admitting: Internal Medicine

## 2022-12-16 DIAGNOSIS — R5382 Chronic fatigue, unspecified: Secondary | ICD-10-CM | POA: Diagnosis not present

## 2022-12-16 DIAGNOSIS — G478 Other sleep disorders: Secondary | ICD-10-CM | POA: Diagnosis not present

## 2022-12-16 DIAGNOSIS — F3132 Bipolar disorder, current episode depressed, moderate: Secondary | ICD-10-CM | POA: Diagnosis not present

## 2022-12-16 DIAGNOSIS — Z79899 Other long term (current) drug therapy: Secondary | ICD-10-CM | POA: Diagnosis not present

## 2022-12-16 DIAGNOSIS — F411 Generalized anxiety disorder: Secondary | ICD-10-CM | POA: Diagnosis not present

## 2022-12-19 ENCOUNTER — Emergency Department: Payer: Medicare HMO

## 2022-12-19 ENCOUNTER — Inpatient Hospital Stay
Admission: EM | Admit: 2022-12-19 | Discharge: 2022-12-21 | DRG: 313 | Disposition: A | Discharge status: Home / Self Care | Payer: Medicare HMO | Attending: Internal Medicine | Admitting: Internal Medicine

## 2022-12-19 ENCOUNTER — Other Ambulatory Visit: Payer: Self-pay

## 2022-12-19 DIAGNOSIS — A09 Infectious gastroenteritis and colitis, unspecified: Secondary | ICD-10-CM | POA: Diagnosis not present

## 2022-12-19 DIAGNOSIS — F419 Anxiety disorder, unspecified: Secondary | ICD-10-CM | POA: Diagnosis not present

## 2022-12-19 DIAGNOSIS — I10 Essential (primary) hypertension: Secondary | ICD-10-CM | POA: Diagnosis not present

## 2022-12-19 DIAGNOSIS — Z634 Disappearance and death of family member: Secondary | ICD-10-CM

## 2022-12-19 DIAGNOSIS — Z8249 Family history of ischemic heart disease and other diseases of the circulatory system: Secondary | ICD-10-CM | POA: Diagnosis not present

## 2022-12-19 DIAGNOSIS — E860 Dehydration: Secondary | ICD-10-CM | POA: Diagnosis not present

## 2022-12-19 DIAGNOSIS — N179 Acute kidney failure, unspecified: Secondary | ICD-10-CM | POA: Diagnosis present

## 2022-12-19 DIAGNOSIS — G43709 Chronic migraine without aura, not intractable, without status migrainosus: Secondary | ICD-10-CM | POA: Diagnosis not present

## 2022-12-19 DIAGNOSIS — R197 Diarrhea, unspecified: Secondary | ICD-10-CM | POA: Diagnosis not present

## 2022-12-19 DIAGNOSIS — I119 Hypertensive heart disease without heart failure: Secondary | ICD-10-CM | POA: Diagnosis present

## 2022-12-19 DIAGNOSIS — F319 Bipolar disorder, unspecified: Secondary | ICD-10-CM | POA: Diagnosis present

## 2022-12-19 DIAGNOSIS — G90A Postural orthostatic tachycardia syndrome (POTS): Secondary | ICD-10-CM | POA: Diagnosis not present

## 2022-12-19 DIAGNOSIS — Z887 Allergy status to serum and vaccine status: Secondary | ICD-10-CM | POA: Diagnosis not present

## 2022-12-19 DIAGNOSIS — K589 Irritable bowel syndrome without diarrhea: Secondary | ICD-10-CM | POA: Diagnosis present

## 2022-12-19 DIAGNOSIS — R0789 Other chest pain: Secondary | ICD-10-CM | POA: Diagnosis not present

## 2022-12-19 DIAGNOSIS — R42 Dizziness and giddiness: Secondary | ICD-10-CM

## 2022-12-19 DIAGNOSIS — M797 Fibromyalgia: Secondary | ICD-10-CM | POA: Diagnosis present

## 2022-12-19 DIAGNOSIS — Z881 Allergy status to other antibiotic agents status: Secondary | ICD-10-CM | POA: Diagnosis not present

## 2022-12-19 DIAGNOSIS — K219 Gastro-esophageal reflux disease without esophagitis: Secondary | ICD-10-CM | POA: Diagnosis present

## 2022-12-19 DIAGNOSIS — I959 Hypotension, unspecified: Secondary | ICD-10-CM | POA: Diagnosis not present

## 2022-12-19 DIAGNOSIS — R918 Other nonspecific abnormal finding of lung field: Secondary | ICD-10-CM | POA: Diagnosis not present

## 2022-12-19 DIAGNOSIS — G8929 Other chronic pain: Secondary | ICD-10-CM | POA: Diagnosis present

## 2022-12-19 DIAGNOSIS — F410 Panic disorder [episodic paroxysmal anxiety] without agoraphobia: Secondary | ICD-10-CM | POA: Diagnosis present

## 2022-12-19 DIAGNOSIS — Z79899 Other long term (current) drug therapy: Secondary | ICD-10-CM

## 2022-12-19 DIAGNOSIS — Z888 Allergy status to other drugs, medicaments and biological substances status: Secondary | ICD-10-CM | POA: Diagnosis not present

## 2022-12-19 DIAGNOSIS — Z880 Allergy status to penicillin: Secondary | ICD-10-CM

## 2022-12-19 DIAGNOSIS — E876 Hypokalemia: Secondary | ICD-10-CM | POA: Diagnosis not present

## 2022-12-19 DIAGNOSIS — R079 Chest pain, unspecified: Secondary | ICD-10-CM | POA: Diagnosis not present

## 2022-12-19 DIAGNOSIS — R55 Syncope and collapse: Secondary | ICD-10-CM | POA: Diagnosis not present

## 2022-12-19 DIAGNOSIS — E785 Hyperlipidemia, unspecified: Secondary | ICD-10-CM | POA: Diagnosis present

## 2022-12-19 DIAGNOSIS — J984 Other disorders of lung: Secondary | ICD-10-CM | POA: Diagnosis not present

## 2022-12-19 DIAGNOSIS — R200 Anesthesia of skin: Secondary | ICD-10-CM | POA: Diagnosis not present

## 2022-12-19 DIAGNOSIS — R9431 Abnormal electrocardiogram [ECG] [EKG]: Secondary | ICD-10-CM | POA: Diagnosis not present

## 2022-12-19 LAB — CBC
HCT: 44.1 % (ref 36.0–46.0)
Hemoglobin: 14.3 g/dL (ref 12.0–15.0)
MCH: 28.7 pg (ref 26.0–34.0)
MCHC: 32.4 g/dL (ref 30.0–36.0)
MCV: 88.4 fL (ref 80.0–100.0)
Platelets: 290 10*3/uL (ref 150–400)
RBC: 4.99 MIL/uL (ref 3.87–5.11)
RDW: 13.5 % (ref 11.5–15.5)
WBC: 8.5 10*3/uL (ref 4.0–10.5)
nRBC: 0 % (ref 0.0–0.2)

## 2022-12-19 LAB — URINALYSIS, W/ REFLEX TO CULTURE (INFECTION SUSPECTED)
Bilirubin Urine: NEGATIVE
Glucose, UA: NEGATIVE mg/dL
Ketones, ur: NEGATIVE mg/dL
Nitrite: NEGATIVE
Protein, ur: NEGATIVE mg/dL
Specific Gravity, Urine: 1.011 (ref 1.005–1.030)
WBC, UA: >50 WBC/hpf (ref 0–5)
pH: 5 (ref 5.0–8.0)

## 2022-12-19 LAB — BASIC METABOLIC PANEL
Anion gap: 7 (ref 5–15)
BUN: 10 mg/dL (ref 6–20)
CO2: 22 mmol/L (ref 22–32)
Calcium: 8.3 mg/dL — ABNORMAL LOW (ref 8.9–10.3)
Chloride: 109 mmol/L (ref 98–111)
Creatinine, Ser: 1.22 mg/dL — ABNORMAL HIGH (ref 0.44–1.00)
GFR, Estimated: 53 mL/min — ABNORMAL LOW (ref >60)
Glucose, Bld: 158 mg/dL — ABNORMAL HIGH (ref 70–99)
Potassium: 3.4 mmol/L — ABNORMAL LOW (ref 3.5–5.1)
Sodium: 138 mmol/L (ref 135–145)

## 2022-12-19 LAB — LIPID PANEL
Cholesterol: 312 mg/dL — ABNORMAL HIGH (ref 0–200)
HDL: 56 mg/dL (ref >40)
LDL Cholesterol: 188 mg/dL — ABNORMAL HIGH (ref 0–99)
Total CHOL/HDL Ratio: 5.6 RATIO
Triglycerides: 341 mg/dL — ABNORMAL HIGH (ref <150)
VLDL: 68 mg/dL — ABNORMAL HIGH (ref 0–40)

## 2022-12-19 LAB — HEPATIC FUNCTION PANEL
ALT: 15 U/L (ref 0–44)
AST: 21 U/L (ref 15–41)
Albumin: 3.7 g/dL (ref 3.5–5.0)
Alkaline Phosphatase: 58 U/L (ref 38–126)
Bilirubin, Direct: <0.1 mg/dL (ref 0.0–0.2)
Total Bilirubin: 0.5 mg/dL (ref 0.3–1.2)
Total Protein: 6.8 g/dL (ref 6.5–8.1)

## 2022-12-19 LAB — URINE DRUG SCREEN, QUALITATIVE (ARMC ONLY)
Amphetamines, Ur Screen: NOT DETECTED
Barbiturates, Ur Screen: NOT DETECTED
Benzodiazepine, Ur Scrn: NOT DETECTED
Cannabinoid 50 Ng, Ur ~~LOC~~: NOT DETECTED
Cocaine Metabolite,Ur ~~LOC~~: NOT DETECTED
MDMA (Ecstasy)Ur Screen: NOT DETECTED
Methadone Scn, Ur: NOT DETECTED
Opiate, Ur Screen: NOT DETECTED
Phencyclidine (PCP) Ur S: NOT DETECTED
Tricyclic, Ur Screen: NOT DETECTED

## 2022-12-19 LAB — LIPASE, BLOOD: Lipase: 44 U/L (ref 11–51)

## 2022-12-19 LAB — HIV ANTIBODY (ROUTINE TESTING W REFLEX): HIV Screen 4th Generation wRfx: NONREACTIVE

## 2022-12-19 LAB — TROPONIN I (HIGH SENSITIVITY)
Troponin I (High Sensitivity): <2 ng/L (ref <18)
Troponin I (High Sensitivity): <2 ng/L (ref <18)
Troponin I (High Sensitivity): <2 ng/L (ref <18)

## 2022-12-19 LAB — MAGNESIUM: Magnesium: 2.2 mg/dL (ref 1.7–2.4)

## 2022-12-19 LAB — CBG MONITORING, ED: Glucose-Capillary: 91 mg/dL (ref 70–99)

## 2022-12-19 LAB — POC URINE PREG, ED: Preg Test, Ur: NEGATIVE

## 2022-12-19 MED ORDER — ENOXAPARIN SODIUM 40 MG/0.4ML IJ SOSY
40.0000 mg | PREFILLED_SYRINGE | INTRAMUSCULAR | Status: DC
  Filled 2022-12-19 (×2): qty 0.4

## 2022-12-19 MED ORDER — ALUM & MAG HYDROXIDE-SIMETH 200-200-20 MG/5ML PO SUSP
30.0000 mL | Freq: Four times a day (QID) | ORAL | Status: DC | PRN
  Administered 2022-12-19: 30 mL via ORAL
  Filled 2022-12-19: qty 30

## 2022-12-19 MED ORDER — ASPIRIN 81 MG PO CHEW
324.0000 mg | CHEWABLE_TABLET | Freq: Once | ORAL | Status: AC
  Administered 2022-12-19: 324 mg via ORAL
  Filled 2022-12-19: qty 4

## 2022-12-19 MED ORDER — FLUVOXAMINE MALEATE 50 MG PO TABS
50.0000 mg | ORAL_TABLET | Freq: Every day | ORAL | Status: DC
  Filled 2022-12-19 (×2): qty 2

## 2022-12-19 MED ORDER — ACETAMINOPHEN 325 MG PO TABS: 650.0000 mg | ORAL_TABLET | Freq: Four times a day (QID) | ORAL | Status: DC | PRN

## 2022-12-19 MED ORDER — SODIUM CHLORIDE 0.9 % IV BOLUS
1000.0000 mL | Freq: Once | INTRAVENOUS | Status: AC
  Administered 2022-12-19: 1000 mL via INTRAVENOUS

## 2022-12-19 MED ORDER — NORETHIN ACE-ETH ESTRAD-FE 1-20 MG-MCG PO TABS
1.0000 | ORAL_TABLET | Freq: Every day | ORAL | Status: DC
  Administered 2022-12-20: 1 via ORAL

## 2022-12-19 MED ORDER — SODIUM CHLORIDE 0.9 % IV BOLUS
500.0000 mL | Freq: Once | INTRAVENOUS | Status: AC
  Administered 2022-12-19: 500 mL via INTRAVENOUS

## 2022-12-19 MED ORDER — NITROGLYCERIN 0.4 MG SL SUBL: 0.4000 mg | SUBLINGUAL_TABLET | SUBLINGUAL | Status: DC | PRN

## 2022-12-19 MED ORDER — ONDANSETRON HCL 4 MG/2ML IJ SOLN: 4.0000 mg | Freq: Three times a day (TID) | INTRAMUSCULAR | Status: DC | PRN

## 2022-12-19 MED ORDER — FLUVOXAMINE MALEATE 50 MG PO TABS
50.0000 mg | ORAL_TABLET | Freq: Every day | ORAL | Status: DC
  Administered 2022-12-20 – 2022-12-21 (×2): 50 mg via ORAL
  Filled 2022-12-19 (×2): qty 2

## 2022-12-19 MED ORDER — PANTOPRAZOLE SODIUM 40 MG PO TBEC
40.0000 mg | DELAYED_RELEASE_TABLET | Freq: Every day | ORAL | Status: DC
  Administered 2022-12-20 – 2022-12-21 (×2): 40 mg via ORAL
  Filled 2022-12-19 (×2): qty 1

## 2022-12-19 MED ORDER — IOHEXOL 350 MG/ML SOLN
1.0000 mL | Freq: Once | INTRAVENOUS | Status: AC | PRN
  Administered 2022-12-19: 1 mL via INTRAVENOUS

## 2022-12-19 MED ORDER — HYDRALAZINE HCL 20 MG/ML IJ SOLN: 5.0000 mg | INTRAMUSCULAR | Status: DC | PRN

## 2022-12-19 MED ORDER — CLONAZEPAM 1 MG PO TABS: 1.0000 mg | ORAL_TABLET | Freq: Two times a day (BID) | ORAL | Status: DC | PRN

## 2022-12-19 MED ORDER — METHOCARBAMOL 500 MG PO TABS
750.0000 mg | ORAL_TABLET | Freq: Three times a day (TID) | ORAL | Status: DC | PRN
  Administered 2022-12-19 – 2022-12-20 (×2): 750 mg via ORAL
  Filled 2022-12-19 (×2): qty 2

## 2022-12-19 MED ORDER — SODIUM CHLORIDE 0.9 % IV SOLN: INTRAVENOUS | Status: DC

## 2022-12-19 MED ORDER — IOHEXOL 350 MG/ML SOLN
75.0000 mL | Freq: Once | INTRAVENOUS | Status: AC | PRN
  Administered 2022-12-19: 75 mL via INTRAVENOUS

## 2022-12-19 MED ORDER — SUMATRIPTAN SUCCINATE 50 MG PO TABS
50.0000 mg | ORAL_TABLET | ORAL | Status: DC | PRN
  Administered 2022-12-19: 50 mg via ORAL
  Filled 2022-12-19 (×2): qty 1

## 2022-12-19 MED ORDER — GABAPENTIN 300 MG PO CAPS
600.0000 mg | ORAL_CAPSULE | Freq: Three times a day (TID) | ORAL | Status: DC
  Administered 2022-12-19 – 2022-12-21 (×5): 600 mg via ORAL
  Filled 2022-12-19 (×5): qty 2

## 2022-12-19 MED ORDER — POTASSIUM CHLORIDE CRYS ER 20 MEQ PO TBCR
40.0000 meq | EXTENDED_RELEASE_TABLET | Freq: Once | ORAL | Status: AC
  Administered 2022-12-19: 40 meq via ORAL
  Filled 2022-12-19: qty 2

## 2022-12-19 MED ORDER — MECLIZINE HCL 25 MG PO TABS
25.0000 mg | ORAL_TABLET | Freq: Three times a day (TID) | ORAL | Status: DC | PRN
  Administered 2022-12-19 – 2022-12-20 (×2): 25 mg via ORAL
  Filled 2022-12-19 (×3): qty 1

## 2022-12-19 NOTE — Plan of Care (Signed)

## 2022-12-19 NOTE — ED Notes (Signed)
Pt in bed, pt has a twitch in her R face and possibly a facial droop, sig other states that smile looks normal.  Pt reports headache and some dizziness, states that she also has some decreased sensation on the R face and arm, pt states that she has a hx of headaches and gets the decreased sensation several times a day, md at bedside evaluating pt.  Pt states that she is not concerned about a stoke, states that she sees a neurologist for her headaches.

## 2022-12-19 NOTE — ED Provider Notes (Signed)
Patient reports very lightheaded whenever she attempts to sit up.  I checked her blood pressure with her sitting her blood pressure 132 systolic.  Heart rate does not rise.  Then attempted to check her blood pressure with her standing at the side of the bed but she was unable to stand without feeling as though she is dizzy or about to faint.  At this juncture, she has received a liter and a half of fluid she does not show obvious orthostatic abnormality, but is unable to stand.  She continues to have mild chest pressure.  Thus far her cardiac workup has been negative.  I question if this could be a potential side effect of her increased Wellbutrin.  Given her inability to stand, ambulate, and ongoing symptoms I placed a consult with the hospitalist service.  Patient and her husband both understand agreeable with plan for further consultation with the hospitalist   Sharyn Creamer, MD 12/19/22 1525

## 2022-12-19 NOTE — ED Provider Notes (Signed)
[facility] Provider Note    Event Date/Time   First MD Initiated Contact with Patient 12/19/22 [phone]     (approximate)   History   Chest Pain   HPI  Melissa [name] is a 52 y.o. female  She went up and had breakfast today, she was able to eat pancakes and eggs.  She ate a full meal.  While doing breakfast though she started feeling a sense of chest tightness and feeling lightheaded.  Did not pass out or otherwise, but continues to have a slight feeling of a tightness or a slight pressure feeling over the front of her chest.  Does not radiate.  There is no nausea or vomiting.  No shortness of breath.  Review of primary care note from June 21 has a history of bipolar disorder, fibromyalgia, migraines, POTS, syncope   The patient does report that her depression has worsened slightly and she saw Dr. Jan 04, 2023 due to the death of one of their animals and also death of a family member.  Her doctor increased her Wellbutrin dose, and doubling it.  She started taking that yesterday  Patient yesterday had loose watery diarrhea, total of about 10 times throughout the day.  No black or bloody.  She reports that that is completely resolved.  Had no abdominal pain but just loose stool.  Her husband experienced the same symptoms roughly the day before, and he reports he got better.  Physical Exam   Triage Vital Signs: ED Triage Vitals  Enc Vitals Group     BP 12/19/22 0845 118/71     Pulse Rate 12/19/22 0845 100     Resp 12/19/22 0845 20     Temp 12/19/22 0845 98.3 F (36.8 C)     Temp src --      SpO2 12/19/22 0845 100 %     Weight 12/19/22 0843 160 lb (72.6 kg)     Height 12/19/22 0843 5\' 9"  (1.753 m)     Head Circumference --      Peak Flow --      Pain Score 12/19/22 0842 6     Pain Loc --      Pain Edu? --      Excl. in GC? --     Most recent vital signs: Vitals:   12/19/22 1300 12/19/22 1407  BP: 108/69 121/69  Pulse: 77 71  Resp: 17 18  Temp:  98.3  F (36.8 C)  SpO2: 100% 100%     General: Awake, no distress.  CV:  Good peripheral perfusion.  Normal rate and tone Resp:  Normal effort.  Clear bilateral Abd:  No distention.  Soft nontender nondistended throughout Other:  No lower extremity edema   ED Results / Procedures / Treatments   Labs (all labs ordered are listed, but only abnormal results are displayed) Labs Reviewed  BASIC METABOLIC PANEL - Abnormal; Notable for the following components:      Result Value   Potassium 3.4 (*)    Glucose, Bld 158 (*)    Creatinine, Ser 1.22 (*)    Calcium 8.3 (*)    GFR, Estimated 53 (*)    All other components within normal limits  CBC  MAGNESIUM  HEPATIC FUNCTION PANEL  LIPASE, BLOOD  POC URINE PREG, ED  CBG MONITORING, ED  TROPONIN I (HIGH SENSITIVITY)  TROPONIN I (HIGH SENSITIVITY)     EKG  Initial triage EKG interpreted at 844 heart rate 100 QRS 80 QTc  470 Normal sinus rhythm, strange perhaps artifactual abnormalities seen throughout the ECG.  T waves appear biphasic in multiple regions, suspect element of artifact.  No obvious STEMI.  This does not seem to likely represent an ischemic picture  I had the EKG repeated just a few minutes later Heart rate 90 QRS 85 QTc 440 Normal sinus rhythm no evidence of ischemia or ectopy.  On her repeat ECG I do not see acute abnormality or concern to suggest ischemia  Alternative consideration is that she could be experiencing intermittent coronary vasospasm given the initial triage EKG versus repeat EKG.  I am however somewhat suspicious there may be some element of artifact.  Will certainly follow with troponin testing.  Unstable angina, NSTEMI, ACS, coronary vasospasm, medication side effect especially given increased dose of Wellbutrin, electrolyte abnormality, etc. all considered   RADIOLOGY    Chest x-ray interpreted me as negative for acute finding   CT HEAD W CONTRAST ( )  Result Date: 12/19/2022 CLINICAL DATA:   Lightheadedness and dizziness. EXAM: CT HEAD WITH CONTRAST TECHNIQUE: Contiguous axial images were obtained from the base of the skull through the vertex with intravenous contrast. RADIATION DOSE REDUCTION: This exam was performed according to the departmental dose-optimization program which includes automated exposure control, adjustment of the mA and/or kV according to patient size and/or use of iterative reconstruction technique. CONTRAST:  75 mL Omnipaque 350 given for immediately preceding CTA chest COMPARISON:  Head CT 08/03/2020 and MRI 07/29/2020 FINDINGS: Brain: There is no evidence of an acute infarct, intracranial hemorrhage, mass, midline shift, or extra-axial fluid collection. The ventricles and sulci are normal. Vascular: Vascular contrast from the preceding chest CTA. Gross patency of the dural venous sinuses. Skull: No acute fracture or suspicious osseous lesion. Sinuses/Orbits: Visualized paranasal sinuses and mastoid air cells are clear. Bilateral cataract extraction. Other: None. IMPRESSION: Negative head CT. Electronically Signed   By: Sebastian Ache M.D.   On: 12/19/2022 15:19   CT Angio Chest PE W and/or Wo Contrast  Result Date: 12/19/2022 CLINICAL DATA:  Hypotension, chest pain EXAM: CT ANGIOGRAPHY CHEST WITH CONTRAST TECHNIQUE: Multidetector CT imaging of the chest was performed using the standard protocol during bolus administration of intravenous contrast. Multiplanar CT image reconstructions and MIPs were obtained to evaluate the vascular anatomy. RADIATION DOSE REDUCTION: This exam was performed according to the departmental dose-optimization program which includes automated exposure control, adjustment of the mA and/or kV according to patient size and/or use of iterative reconstruction technique. CONTRAST:  75mL OMNIPAQUE IOHEXOL 350 MG/ML SOLN COMPARISON:  Previous CT done on 03/03/2020, chest radiographs done today FINDINGS: Cardiovascular: There is homogeneous enhancement in  thoracic aorta. There are no intraluminal filling defects in pulmonary artery branches. Mediastinum/Nodes: No significant lymphadenopathy is seen. Lungs/Pleura: There are small patchy ground-glass densities in both lungs suggesting scarring or interstitial pneumonia. There is significant interval decrease in extensive patchy ground-glass densities in both lungs. There is no focal consolidation. There is no pleural effusion or pneumothorax. Upper Abdomen: No acute findings are seen. Musculoskeletal: Unremarkable. Review of the MIP images confirms the above findings. IMPRESSION: There is no evidence of pulmonary artery embolism. There is no evidence of thoracic aortic dissection. There is no focal pulmonary consolidation. There is no pleural effusion or pneumothorax. There are faint ground-glass densities with mosaic pattern in the lung fields, more so in the upper lung fields. This finding may suggest scarring and small airway disease. There is significant interval improvement in aeration in both lungs in comparison with  the previous CT. Electronically Signed   By: Ernie Avena M.D.   On: 12/19/2022 15:13   DG Chest 2 View  Result Date: 12/19/2022 CLINICAL DATA:  52 year old female with chest pain and heaviness onset today. Diarrhea onset yesterday. EXAM: CHEST - 2 VIEW COMPARISON:  Chest radiographs 06/22/2021 and earlier. FINDINGS: PA and lateral views at 0850 hours. Lung volumes and mediastinal contours remain normal. Visualized tracheal air column is within normal limits. Mild thoracolumbar scoliosis. Lung volumes are stable and within normal limits. Both lungs appear clear. No pneumothorax or pleural effusion. No acute osseous abnormality identified. Negative visible bowel gas. IMPRESSION: No acute cardiopulmonary abnormality. Electronically Signed   By: Odessa Fleming M.D.   On: 12/19/2022 09:08      PROCEDURES:  Critical Care performed: No  Procedures   MEDICATIONS ORDERED IN ED: Medications   sodium chloride 0.9 % bolus 500 mL (has no administration in time range)  aspirin chewable tablet 324 mg (324 mg Oral Given 12/19/22 0919)  potassium chloride SA (KLOR-CON M) CR tablet 40 mEq (40 mEq Oral Given 12/19/22 0948)  sodium chloride 0.9 % bolus 1,000 mL (0 mLs Intravenous Stopped 12/19/22 1212)  sodium chloride 0.9 % bolus 500 mL (0 mLs Intravenous Stopped 12/19/22 1326)  iohexol (OMNIPAQUE) 350 MG/ML injection 75 mL (75 mLs Intravenous Contrast Given 12/19/22 1451)  iohexol (OMNIPAQUE) 350 MG/ML injection 1 mL (1 mL Intravenous Contrast Given 12/19/22 1510)     IMPRESSION / MDM / ASSESSMENT AND PLAN / ED COURSE  I reviewed the triage vital signs and the nursing notes.                              Differential diagnosis includes, but is not limited to, ACS, aortic dissection, pulmonary embolism, cardiac tamponade, pneumothorax, pneumonia, pericarditis, myocarditis, medication effect related to Wellbutrin, GI-related causes including esophagitis/gastritis, and musculoskeletal chest wall pain.     Patient's presentation is most consistent with acute complicated illness / injury requiring diagnostic workup.  CBC interpreted as normal  Metabolic panel notable for mild hypokalemia.  Creatinine slightly elevated from baseline.  The patient is on the cardiac monitor to evaluate for evidence of arrhythmia and/or significant heart rate changes.     With regard to the diarrhea, seems she likely had a acute self-limited diarrheal illness.  She has no associate abdominal pain.  No nausea no vomiting no fevers no chills.  Reassuring normal abdominal examination at this time.  She was able to eat a full breakfast today and has had no further diarrhea.   Patient also reported to nursing tingling over the right face and arm.  On exam she has slight diminishment in sensation of the right face and right hand compared to the left arm.  As she reports that this is a common daily occurrence she has  seen neurology in the past, and this is not a new item for her.  She relates that for the last 6 months she will daily experience a tingling over the right face and right arm.  Her neurologic exam including modified NIH score is 1 based on the paresthesia only, but given the clinical history this does not represent stroke   Consulted with patient accepted in consult to the hospitalist by Dr. Clyde Lundborg  FINAL CLINICAL IMPRESSION(S) / ED DIAGNOSES   Final diagnoses:  Chest pain with low risk for cardiac etiology  Dehydration, mild  Diarrhea of presumed infectious origin  Hypokalemia  Postural dizziness with near syncope     Rx / DC Orders   ED Discharge Orders     None        Note:  This document was prepared using Dragon voice recognition software and may include unintentional dictation errors.   Sharyn Creamer, MD 12/19/22 [phone]

## 2022-12-19 NOTE — ED Triage Notes (Signed)
Pt states coming in with chest pain that started today and is heavy. Pt states yesterday she started with diarrhea. Pt also states feeling lightheaded this morning when she woke up. Pt states walking makes the chest pain worse.

## 2022-12-19 NOTE — ED Notes (Signed)
Advised nurse that patient has ready bed 

## 2022-12-19 NOTE — ED Notes (Signed)
Pt in bed, pt reports 6/10 chest tightness, pt states that she doesn't want anything for pain at this time, pt awaits ct.

## 2022-12-19 NOTE — H&P (Signed)
History and Physical    Melissa [name] WGN:[id number] DOB: 20-Jan-1971 DOA: 12/19/2022  Referring MD/NP/PA:   PCP: Lorre Munroe, NP   Patient coming from:  The patient is coming from home.     Chief Complaint: Dizziness, chest pain, diarrhea  HPI: Melissa [name] is a 52 y.o. female with medical history significant of hypertension, hyperlipidemia, GERD, IBS, POTS (postural orthostatic tachycardia syndrome), chronic pain, migraine, fibromyalgia, anxiety, bipolar, panic disorder, who presents with dizziness, chest pain, diarrhea.  Patient states that she had more than 10 episodes of watery diarrhea yesterday, which is resolved now.  She has nausea, no vomiting or abdominal pain.  No fever or chills.  She developed lightheadedness and dizziness today.  She also reports chest pain, which is located in front chest, 6 out of 10 in severity, pressure-like, heaviness, nonradiating, aggravated by walking.  Denies shortness of breath, cough.  Denies symptoms of UTI.  Patient states that her mother passed away in November 203, and recently had funeral.  She feels stressful.  Her doctor increased her Wellbutrin dose from 150 to 300 mg since Thursday.  Patient states that she has chronic intermittent right facial numbness, sometimes with right arm numbness, which is normal to her.  She is seeing neurologist for this issue.  Data reviewed independently and ED Course: pt was found to have trop negative x 3, WBC 8.5, negative pregnancy test, mild AKI with creatinine 1.22, BUN 10, GFR 53 (recent baseline creatinine 1.15 on 12/10/2022), temperature normal, soft blood pressure 89/47 --> 121/69, heart rate 100 --> 71, heart 25, oxygen saturation 100% on room air.  Chest x-ray negative.  CT of head is negative.  CTA negative for PE.  Patient is placed on telemetry bed for observation.  CTA: There is no evidence of pulmonary artery embolism. There is no evidence of thoracic aortic dissection. There is no focal  pulmonary consolidation. There is no pleural effusion or pneumothorax.   There are faint ground-glass densities with mosaic pattern in the lung fields, more so in the upper lung fields. This finding may suggest scarring and small airway disease. There is significant interval improvement in aeration in both lungs in comparison with the previous CT.   EKG: I have personally reviewed.  First EKG has unstable baseline, showed mild T wave inversion in lateral leads and V3-V6, but the repeated EKG with stable baseline, showed no T wave inversion.  Has left atrial enlargement.   Review of Systems:   General: no fevers, chills, no body weight gain, has fatigue HEENT: no blurry vision, hearing changes or sore throat Respiratory: no dyspnea, coughing, wheezing CV: has chest pain, no palpitations GI: has nausea,  diarrhea, no constipation, vomiting, abdominal pain, GU: no dysuria, burning on urination, increased urinary frequency, hematuria  Ext: no leg edema Neuro: no vision change or hearing loss. has chronic intermittent right facial numbness and right arm numbness Skin: no rash, no skin tear. MSK: No muscle spasm, no deformity, no limitation of range of movement in spin Heme: No easy bruising.  Travel history: No recent long distant travel.   Allergy:  Allergies  Allergen Reactions   Lamotrigine Hives and Swelling   Abilify [Aripiprazole] Other (See Comments)    Weight gain, did not help,    Azithromycin Rash   Depakote [Divalproex Sodium] Other (See Comments)    Hair loss   Erythromycin Base Swelling   Lithium Rash   Penicillins Rash    Has patient had a PCN reaction  causing immediate rash, facial/tongue/throat swelling, SOB or lightheadedness with hypotension: Yes- swelling, rash  Has patient had a PCN reaction causing severe rash involving mucus membranes or skin necrosis: Yes Has patient had a PCN reaction that required hospitalization No Has patient had a PCN reaction  occurring within the last 10 years: No If all of the above answers are "NO", then may proceed with Cephalosporin use.    Pregabalin Palpitations    Manic symptoms, no sleep x's 3 days, no appetite.   Seroquel [Quetiapine Fumarate] Other (See Comments)    Irritable   Sulfa Antibiotics Rash   Tetanus Toxoids Other (See Comments)    Swelling to injection site   Tramadol Nausea And Vomiting   Ziprasidone Hcl Other (See Comments)    Heat/cold intolerance.     Past Medical History:  Diagnosis Date   Arthritis    Bipolar disorder (HCC)    Fibromyalgia    on disability   Migraine    Muscle spasm    Pain of right side of body    chronic   Panic attack    POTS (postural orthostatic tachycardia syndrome)    Syncope    recurrent   Uterine perforation    hx of    Past Surgical History:  Procedure Laterality Date   CATARACT EXTRACTION  dec 2014   DILATION AND CURETTAGE OF UTERUS     LASIK  dec 2014    Social History:  reports that she has never smoked. She has never used smokeless tobacco. She reports that she does not drink alcohol and does not use drugs.  Family History:  Family History  Problem Relation Age of Onset   Diabetes Mother    Hypertension Mother    Brain cancer Father    Healthy Sister    Ovarian cancer Maternal Grandmother        metastasized   Lung cancer Maternal Grandfather    Cancer Paternal Grandmother        not sure of the type   Cancer Paternal Grandfather        not sure of the type   Migraines Neg Hx      Prior to Admission medications   Medication Sig Start Date End Date Taking? Authorizing Provider  acetaminophen (TYLENOL) 500 MG tablet Take 1,000 mg by mouth daily as needed for mild pain.   Yes [provider]  buPROPion (WELLBUTRIN XL) 150 MG 24 hr tablet Take 1 tablet (150 mg total) by mouth daily. 07/17/19  Yes Dianne Dun, MD  clonazePAM (KLONOPIN) 1 MG tablet Take 1 tablet (1 mg total) by mouth 2 (two) times daily as needed.  for anxiety 05/26/16  Yes Dianne Dun, MD  fexofenadine (ALLEGRA) 180 MG tablet Take 180 mg by mouth daily.   Yes [provider]  fluvoxaMINE (LUVOX) 50 MG tablet Take 50-100 mg by mouth at bedtime.  05/23/17  Yes [provider]  gabapentin (NEURONTIN) 600 MG tablet Take 1 tablet (600 mg total) by mouth 3 (three) times daily. 08/06/22  Yes Lorre Munroe, NP  JUNEL FE 1/20 1-20 MG-MCG tablet Take 1 tablet by mouth daily. 11/25/22  Yes [provider]  methocarbamol (ROBAXIN) 750 MG tablet TAKE 1 TABLET BY MOUTH EVERY 8 HOURS AS NEEDED FOR MUSCLE SPASMS 12/16/22  Yes Baity, [name], NP  naratriptan (AMERGE) 2.5 MG tablet TAKE 1 TABLET BY MOUTH AT ONSET OF MIGRAINE. IF RETURNS OR DOES NOT RESOLVE, MAY REPEAT AFTER 4 HOURS.  DO NOT EXCEED 2 TABS (5 MG) IN 24 HOURS. 11/09/22  Yes Anson Fret, MD  omeprazole (PRILOSEC) 40 MG capsule Take 1 capsule (40 mg total) by mouth daily. 08/27/22  Yes Lorre Munroe, NP  ondansetron (ZOFRAN-ODT) 8 MG disintegrating tablet Take 0.5 tablets (4 mg total) by mouth every 8 (eight) hours as needed for nausea or vomiting. 08/18/21  Yes Anson Fret, MD  verapamil (CALAN-SR) 120 MG CR tablet TAKE 1 TABLET BY MOUTH AT BEDTIME 09/29/22  Yes Baity, Regina W, NP  Fremanezumab-vfrm (AJOVY) 225 MG/1.5ML SOAJ Inject 225 mg into the skin every 30 (thirty) days. 08/18/21   Anson Fret, MD  mirtazapine (REMERON) 7.5 MG tablet Take 1 tablet (7.5 mg total) by mouth at bedtime. Patient not taking: Reported on 12/19/2022 08/10/22   [name], DO  norgestimate-ethinyl estradiol (SPRINTEC 28) 0.25-35 MG-MCG tablet Take 1 tablet by mouth daily. Patient not taking: Reported on 12/19/2022 07/17/19   Dianne Dun, MD    Physical Exam: Vitals:   12/19/22 1615 12/19/22 1730 12/19/22 1745 12/19/22 1831  BP: (!) 134/59   (!) 141/81  Pulse: 81 81 78 95  Resp: 16 (!) 23 (!) 24 18  Temp:    98.3 F (36.8 C)  TempSrc:    Oral  SpO2: 100% 100%  100% 99%  Weight:      Height:       General: Not in acute distress HEENT:       Eyes: PERRL, EOMI, no jaundice       ENT: No discharge from the ears and nose, no pharynx injection, no tonsillar enlargement.        Neck: No JVD, no bruit, no mass felt. Heme: No neck lymph node enlargement. Cardiac: S1/S2, RRR, No murmurs, No gallops or rubs. Respiratory: No rales, wheezing, rhonchi or rubs. GI: Soft, nondistended, nontender, no rebound pain, no organomegaly, BS present. GU: No hematuria Ext: No pitting leg edema bilaterally. 1+DP/PT pulse bilaterally. Musculoskeletal: No joint deformities, No joint redness or warmth, no limitation of ROM in spin. Skin: No rashes.  Neuro: Alert, oriented X3, cranial nerves II-XII grossly intact, moves all extremities normally. Muscle strength 5/5 in all extremities, sensation to light touch intact. Brachial reflex 2+ bilaterally. Knee reflex 1+ bilaterally. Negative Babinski's sign. Normal finger to nose test. Psych: Patient is not psychotic, no suicidal or hemocidal ideation.  Labs on Admission: I have personally reviewed following labs and imaging studies  CBC: Recent Labs  Lab 12/19/22 0846  WBC 8.5  HGB 14.3  HCT 44.1  MCV 88.4  PLT 290   Basic Metabolic Panel: Recent Labs  Lab 12/19/22 0846  NA 138  K 3.4*  CL 109  CO2 22  GLUCOSE 158*  BUN 10  CREATININE 1.22*  CALCIUM 8.3*  MG 2.2   GFR: Estimated Creatinine Clearance: 55.7 mL/min (A) (by C-G formula based on SCr of 1.22 mg/dL (H)). Liver Function Tests: Recent Labs  Lab 12/19/22 0846  AST 21  ALT 15  ALKPHOS 58  BILITOT 0.5  PROT 6.8  ALBUMIN 3.7   Recent Labs  Lab 12/19/22 0846  LIPASE 44   No results for input(s): "AMMONIA" in the last 168 hours. Coagulation Profile: No results for input(s): "INR", "PROTIME" in the last 168 hours. Cardiac Enzymes: No results for input(s): "CKTOTAL", "CKMB", "CKMBINDEX", "TROPONINI" in the last 168 hours. BNP (last 3  results) No results for input(s): "PROBNP" in the last 8760 hours. HbA1C: No results  for input(s): "HGBA1C" in the last 72 hours. CBG: Recent Labs  Lab 12/19/22 1209  GLUCAP 91   Lipid Profile: Recent Labs    12/19/22 0846  CHOL 312*  HDL 56  LDLCALC 188*  TRIG 341*  CHOLHDL 5.6   Thyroid Function Tests: No results for input(s): "TSH", "T4TOTAL", "FREET4", "T3FREE", "THYROIDAB" in the last 72 hours. Anemia Panel: No results for input(s): "VITAMINB12", "FOLATE", "FERRITIN", "TIBC", "IRON", "RETICCTPCT" in the last 72 hours. Urine analysis:    Component Value Date/Time   COLORURINE YELLOW (A) 12/19/2022 1614   APPEARANCEUR HAZY (A) 12/19/2022 1614   APPEARANCEUR Clear 11/04/2012 1650   LABSPEC 1.011 12/19/2022 1614   LABSPEC 1.012 11/04/2012 1650   PHURINE 5.0 12/19/2022 1614   GLUCOSEU NEGATIVE 12/19/2022 1614   GLUCOSEU Negative 11/04/2012 1650   HGBUR SMALL (A) 12/19/2022 1614   BILIRUBINUR NEGATIVE 12/19/2022 1614   BILIRUBINUR Negative 09/08/2017 0904   BILIRUBINUR Negative 11/04/2012 1650   KETONESUR NEGATIVE 12/19/2022 1614   PROTEINUR NEGATIVE 12/19/2022 1614   UROBILINOGEN 0.2 09/08/2017 0904   UROBILINOGEN 0.2 10/21/2014 1321   NITRITE NEGATIVE 12/19/2022 1614   LEUKOCYTESUR LARGE (A) 12/19/2022 1614   LEUKOCYTESUR Trace 11/04/2012 1650   Sepsis Labs: @LABRCNTIP (procalcitonin:4,lacticidven:4) )No results found for this or any previous visit (from the past 240 hour(s)).   Radiological Exams on Admission: CT HEAD W CONTRAST ( )  Result Date: 12/19/2022 CLINICAL DATA:  Lightheadedness and dizziness. EXAM: CT HEAD WITH CONTRAST TECHNIQUE: Contiguous axial images were obtained from the base of the skull through the vertex with intravenous contrast. RADIATION DOSE REDUCTION: This exam was performed according to the departmental dose-optimization program which includes automated exposure control, adjustment of the mA and/or kV according to patient size and/or  use of iterative reconstruction technique. CONTRAST:  75 mL Omnipaque 350 given for immediately preceding CTA chest COMPARISON:  Head CT 08/03/2020 and MRI 07/29/2020 FINDINGS: Brain: There is no evidence of an acute infarct, intracranial hemorrhage, mass, midline shift, or extra-axial fluid collection. The ventricles and sulci are normal. Vascular: Vascular contrast from the preceding chest CTA. Gross patency of the dural venous sinuses. Skull: No acute fracture or suspicious osseous lesion. Sinuses/Orbits: Visualized paranasal sinuses and mastoid air cells are clear. Bilateral cataract extraction. Other: None. IMPRESSION: Negative head CT. Electronically Signed   By: Sebastian Ache M.D.   On: 12/19/2022 15:19   CT Angio Chest PE W and/or Wo Contrast  Result Date: 12/19/2022 CLINICAL DATA:  Hypotension, chest pain EXAM: CT ANGIOGRAPHY CHEST WITH CONTRAST TECHNIQUE: Multidetector CT imaging of the chest was performed using the standard protocol during bolus administration of intravenous contrast. Multiplanar CT image reconstructions and MIPs were obtained to evaluate the vascular anatomy. RADIATION DOSE REDUCTION: This exam was performed according to the departmental dose-optimization program which includes automated exposure control, adjustment of the mA and/or kV according to patient size and/or use of iterative reconstruction technique. CONTRAST:  75mL OMNIPAQUE IOHEXOL 350 MG/ML SOLN COMPARISON:  Previous CT done on 03/03/2020, chest radiographs done today FINDINGS: Cardiovascular: There is homogeneous enhancement in thoracic aorta. There are no intraluminal filling defects in pulmonary artery branches. Mediastinum/Nodes: No significant lymphadenopathy is seen. Lungs/Pleura: There are small patchy ground-glass densities in both lungs suggesting scarring or interstitial pneumonia. There is significant interval decrease in extensive patchy ground-glass densities in both lungs. There is no focal consolidation.  There is no pleural effusion or pneumothorax. Upper Abdomen: No acute findings are seen. Musculoskeletal: Unremarkable. Review of the MIP images confirms the above findings.  IMPRESSION: There is no evidence of pulmonary artery embolism. There is no evidence of thoracic aortic dissection. There is no focal pulmonary consolidation. There is no pleural effusion or pneumothorax. There are faint ground-glass densities with mosaic pattern in the lung fields, more so in the upper lung fields. This finding may suggest scarring and small airway disease. There is significant interval improvement in aeration in both lungs in comparison with the previous CT. Electronically Signed   By: Ernie Avena M.D.   On: 12/19/2022 15:13   DG Chest 2 View  Result Date: 12/19/2022 CLINICAL DATA:  52 year old female with chest pain and heaviness onset today. Diarrhea onset yesterday. EXAM: CHEST - 2 VIEW COMPARISON:  Chest radiographs 06/22/2021 and earlier. FINDINGS: PA and lateral views at 0850 hours. Lung volumes and mediastinal contours remain normal. Visualized tracheal air column is within normal limits. Mild thoracolumbar scoliosis. Lung volumes are stable and within normal limits. Both lungs appear clear. No pneumothorax or pleural effusion. No acute osseous abnormality identified. Negative visible bowel gas. IMPRESSION: No acute cardiopulmonary abnormality. Electronically Signed   By: Odessa Fleming M.D.   On: 12/19/2022 09:08      Assessment/Plan Principal Problem:   Chest pain Active Problems:   POTS (postural orthostatic tachycardia syndrome)   Dizziness   Chronic migraine without aura without status migrainosus, not intractable   Essential hypertension   Right facial numbness   AKI (acute kidney injury) (HCC)   Diarrhea   Bipolar disorder (HCC)   Anxiety   Assessment and Plan:  Chest pain: Patient has very atypical chest pain, possibly related to stress.  Troponin negative x 3.  The repeated EKG showed  no T wave inversion. -Placed on telemetry for observation -Check A1c, FLP -Check UDS -As needed nitroglycerin  History of POTS (postural orthostatic tachycardia syndrome) and Dizziness: CT head negative.  Her dizziness likely related to dehydration secondary to diarrhea. -Fall precaution -IV fluid: 1.5 L normal saline, NS at 100 cc/h -As needed meclizine  Chronic migraine without aura without status migrainosus, not intractable: Patient is getting Ajovy injection at home -As needed sumatriptan  Essential hypertension -Hold verapamil due to soft blood pressure -IV hydralazine as needed  Right facial numbness: This is chronic intermittent issue, patient is following up with neurologist -Follow-up with neurology  AKI (acute kidney injury) [facility]): Mild AKI.  Likely due to dehydration -IV fluid as above -Avoid using renal toxic medications  Diarrhea -Check C. difficile if patient has diarrhea again  Bipolar disorder (HCC) and anxiety: -Hold Wellbutrin since we are not sure if the increased Wellbutrin dose caused her symptoms -Continue Klonopin, Luvox,       DVT ppx:  SQ Lovenox  Code Status: Full code    Family Communication: not done, no family member is at bed side.     Disposition Plan:  Anticipate discharge back to previous environment  Consults called:  none  Admission status and Level of care: Telemetry Cardiac:    for obs    Dispo: The patient is from: Home              Anticipated d/c is to: Home              Anticipated d/c date is: 1 day              Patient currently is not medically stable to d/c.    Severity of Illness:  The appropriate patient status for this patient is OBSERVATION. Observation status is judged to  be reasonable and necessary in order to provide the required intensity of service to ensure the patient's safety. The patient's presenting symptoms, physical exam findings, and initial radiographic and laboratory data in the context of their  medical condition is felt to place them at decreased risk for further clinical deterioration. Furthermore, it is anticipated that the patient will be medically stable for discharge from the hospital within 2 midnights of admission.        Date of Service 12/19/2022    Lorretta Harp [facility]   If 7PM-7AM, please contact night-coverage [email or web address] 12/19/2022, 6:36 PM

## 2022-12-19 NOTE — ED Notes (Signed)
Patient called out reporting new onset of nausea, MD made aware of BP and symptoms.

## 2022-12-20 ENCOUNTER — Other Ambulatory Visit (HOSPITAL_COMMUNITY): Payer: Self-pay

## 2022-12-20 DIAGNOSIS — R42 Dizziness and giddiness: Secondary | ICD-10-CM | POA: Diagnosis not present

## 2022-12-20 LAB — CBC
HCT: 37.9 % (ref 36.0–46.0)
Hemoglobin: 12.4 g/dL (ref 12.0–15.0)
MCH: 28.7 pg (ref 26.0–34.0)
MCHC: 32.7 g/dL (ref 30.0–36.0)
MCV: 87.7 fL (ref 80.0–100.0)
Platelets: 266 10*3/uL (ref 150–400)
RBC: 4.32 MIL/uL (ref 3.87–5.11)
RDW: 13.2 % (ref 11.5–15.5)
WBC: 8.1 10*3/uL (ref 4.0–10.5)
nRBC: 0 % (ref 0.0–0.2)

## 2022-12-20 LAB — BASIC METABOLIC PANEL
Anion gap: 7 (ref 5–15)
BUN: 8 mg/dL (ref 6–20)
CO2: 22 mmol/L (ref 22–32)
Calcium: 7.9 mg/dL — ABNORMAL LOW (ref 8.9–10.3)
Chloride: 111 mmol/L (ref 98–111)
Creatinine, Ser: 0.97 mg/dL (ref 0.44–1.00)
GFR, Estimated: >60 mL/min (ref >60)
Glucose, Bld: 84 mg/dL (ref 70–99)
Potassium: 3.5 mmol/L (ref 3.5–5.1)
Sodium: 140 mmol/L (ref 135–145)

## 2022-12-20 MED ORDER — NORETHIN ACE-ETH ESTRAD-FE 1-20 MG-MCG PO TABS
1.0000 | ORAL_TABLET | Freq: Every day | ORAL | Status: DC
  Administered 2022-12-21: 1 via ORAL
  Filled 2022-12-20: qty 1

## 2022-12-20 MED ORDER — MECLIZINE HCL 25 MG PO TABS: 25.0000 mg | ORAL_TABLET | Freq: Three times a day (TID) | ORAL | 0 refills | Status: DC | PRN

## 2022-12-20 MED ORDER — BUPROPION HCL ER (XL) 150 MG PO TB24
150.0000 mg | ORAL_TABLET | Freq: Every day | ORAL | Status: DC
  Administered 2022-12-20 – 2022-12-21 (×2): 150 mg via ORAL
  Filled 2022-12-20 (×2): qty 1

## 2022-12-20 NOTE — Progress Notes (Signed)
Progress Note   Patient: Melissa [name]:[id number] DOB: 1970/10/06 DOA: 12/19/2022     0 DOS: the patient was seen and examined on 12/20/2022    Subjective:  Patient had some episode of dizziness when she walked Denies nausea vomiting chest pain palpitation Orthostatic vitals were negative  Brief hospital course: From HPI Melissa [name] is a 52 y.o. female with medical history significant of hypertension, hyperlipidemia, GERD, IBS, POTS (postural orthostatic tachycardia syndrome), chronic pain, migraine, fibromyalgia, anxiety, bipolar, panic disorder, who presents with dizziness, chest pain, diarrhea.   Patient states that she had more than 10 episodes of watery diarrhea yesterday, which is resolved now.  She has nausea, no vomiting or abdominal pain.  No fever or chills.  She developed lightheadedness and dizziness today.  She also reports chest pain, which is located in front chest, 6 out of 10 in severity, pressure-like, heaviness, nonradiating, aggravated by walking.  Denies shortness of breath, cough.  Denies symptoms of UTI.  Patient states that her mother passed away in November 203, and recently had funeral.  She feels stressful.  Her doctor increased her Wellbutrin dose from 150 to 300 mg since Thursday.  Patient states that she has chronic intermittent right facial numbness, sometimes with right arm numbness, which is normal to her.  She is seeing neurologist for this issue.   Data reviewed independently and ED Course: pt was found to have trop negative x 3, WBC 8.5, negative pregnancy test, mild AKI with creatinine 1.22, BUN 10, GFR 53 (recent baseline creatinine 1.15 on 12/10/2022), temperature normal, soft blood pressure 89/47 --> 121/69, heart rate 100 --> 71, heart 25, oxygen saturation 100% on room air.  Chest x-ray negative.  CT of head is negative.  CTA negative for PE.  Patient is placed on telemetry bed for observation.  Assessment and Plan:  Chest pain: Patient has very  atypical chest pain, possibly related to stress.  Troponin negative x 3.  The repeated EKG showed no T wave inversion. -Placed on telemetry for observation -Check A1c, FLP -Check UDS -As needed nitroglycerin   History of POTS (postural orthostatic tachycardia syndrome) and Dizziness: CT head negative.  Her dizziness likely related to dehydration secondary to diarrhea. -Fall precaution Continue IV fluid Continue meclizine Continue to monitor orthostatic vitals   Chronic migraine without aura without status migrainosus, not intractable: Patient is getting Ajovy injection at home -Continue sumatriptan   Essential hypertension We will hold verapamil due to soft blood pressure IV hydralazine as needed   Right facial numbness: This is chronic intermittent issue, patient is following up with neurologist Outpatient follow-up with neurologist   AKI (acute kidney injury) [facility]): Mild AKI.  Likely due to dehydration -IV fluid as above -Avoid using renal toxic medications   Diarrhea -Check C. difficile if patient has diarrhea again   Bipolar disorder (HCC) and anxiety: -Hold Wellbutrin since we are not sure if the increased Wellbutrin dose caused her symptoms -Continue Klonopin, Luvox,    DVT ppx:  SQ Lovenox   Code Status: Full code     Family Communication: not done, no family member is at bed side.      Disposition Plan:  Anticipate discharge back to previous environment   Consults called:  none    Physical Exam:   General: Not in acute distress HEENT: Atraumatic normocephalic Heme: No neck lymph node enlargement. Cardiac: S1/S2, RRR, No murmurs, No gallops or rubs. Respiratory: No rales, wheezing, rhonchi or rubs. GI: Soft, nondistended,  Ext: No pitting leg edema bilaterally. 1+DP/PT pulse bilaterally. Musculoskeletal: No joint deformities, No joint redness or warmth, no limitation of ROM in spin. Skin: No rashes.  Neuro: Alert, oriented X3, cranial nerves II-XII  grossly intact, moves all  Psych: Patient is not psychotic, no suicidal or hemocidal ideation  Vitals:   12/20/22 0451 12/20/22 0805 12/20/22 1203 12/20/22 1659  BP: 129/66 (!) 134/59 (!) 111/53 124/73  Pulse: 68 69 81 82  Resp: 16 18 18 20   Temp: 98.1 F (36.7 C) 98.8 F (37.1 C) 97.9 F (36.6 C) 98.2 F (36.8 C)  TempSrc:      SpO2: 100% 98% 100% 100%  Weight:      Height:        Data Reviewed: I have reviewed patient's CT scan of the brain that did not show acute intracranial pathology as well as BMP and CBC results   Author: Loyce Dys, MD 12/20/2022 5:15 PM  For on call review [email or web address].

## 2022-12-20 NOTE — Progress Notes (Signed)
Chap visited Pt while rounding the unit, introduced Spiritual care to the Pt. And offered calming presence, active listening as the patient reflected how she is feeling.      12/20/22 1300  Spiritual Encounters  Type of Visit Initial  Care provided to: Patient  Referral source Chaplain assessment  Reason for visit Routine spiritual support  OnCall Visit Yes  Spiritual Framework  Presenting Themes Courage hope and growth;Meaning/purpose/sources of inspiration  Community/Connection Significant other  Interventions  Spiritual Care Interventions Made Established relationship of care and support;Narrative/life review  Intervention Outcomes  Outcomes Awareness of support;Reduced isolation  Spiritual Care Plan  Spiritual Care Issues Still Outstanding No further spiritual care needs at this time (see row info)

## 2022-12-21 DIAGNOSIS — R42 Dizziness and giddiness: Secondary | ICD-10-CM | POA: Diagnosis not present

## 2022-12-21 DIAGNOSIS — Z888 Allergy status to other drugs, medicaments and biological substances status: Secondary | ICD-10-CM | POA: Diagnosis not present

## 2022-12-21 DIAGNOSIS — Z634 Disappearance and death of family member: Secondary | ICD-10-CM | POA: Diagnosis not present

## 2022-12-21 DIAGNOSIS — K589 Irritable bowel syndrome without diarrhea: Secondary | ICD-10-CM | POA: Diagnosis present

## 2022-12-21 DIAGNOSIS — Z881 Allergy status to other antibiotic agents status: Secondary | ICD-10-CM | POA: Diagnosis not present

## 2022-12-21 DIAGNOSIS — R197 Diarrhea, unspecified: Secondary | ICD-10-CM | POA: Diagnosis present

## 2022-12-21 DIAGNOSIS — A09 Infectious gastroenteritis and colitis, unspecified: Secondary | ICD-10-CM | POA: Diagnosis present

## 2022-12-21 DIAGNOSIS — G90A Postural orthostatic tachycardia syndrome (POTS): Secondary | ICD-10-CM | POA: Diagnosis not present

## 2022-12-21 DIAGNOSIS — F319 Bipolar disorder, unspecified: Secondary | ICD-10-CM | POA: Diagnosis present

## 2022-12-21 DIAGNOSIS — E860 Dehydration: Secondary | ICD-10-CM

## 2022-12-21 DIAGNOSIS — G8929 Other chronic pain: Secondary | ICD-10-CM | POA: Diagnosis present

## 2022-12-21 DIAGNOSIS — R0789 Other chest pain: Secondary | ICD-10-CM | POA: Diagnosis present

## 2022-12-21 DIAGNOSIS — G43709 Chronic migraine without aura, not intractable, without status migrainosus: Secondary | ICD-10-CM | POA: Diagnosis not present

## 2022-12-21 DIAGNOSIS — F410 Panic disorder [episodic paroxysmal anxiety] without agoraphobia: Secondary | ICD-10-CM | POA: Diagnosis present

## 2022-12-21 DIAGNOSIS — Z887 Allergy status to serum and vaccine status: Secondary | ICD-10-CM | POA: Diagnosis not present

## 2022-12-21 DIAGNOSIS — Z79899 Other long term (current) drug therapy: Secondary | ICD-10-CM | POA: Diagnosis not present

## 2022-12-21 DIAGNOSIS — K219 Gastro-esophageal reflux disease without esophagitis: Secondary | ICD-10-CM | POA: Diagnosis present

## 2022-12-21 DIAGNOSIS — R55 Syncope and collapse: Secondary | ICD-10-CM | POA: Diagnosis present

## 2022-12-21 DIAGNOSIS — Z8249 Family history of ischemic heart disease and other diseases of the circulatory system: Secondary | ICD-10-CM | POA: Diagnosis not present

## 2022-12-21 DIAGNOSIS — Z880 Allergy status to penicillin: Secondary | ICD-10-CM | POA: Diagnosis not present

## 2022-12-21 DIAGNOSIS — M797 Fibromyalgia: Secondary | ICD-10-CM | POA: Diagnosis present

## 2022-12-21 DIAGNOSIS — E876 Hypokalemia: Secondary | ICD-10-CM | POA: Diagnosis present

## 2022-12-21 DIAGNOSIS — E785 Hyperlipidemia, unspecified: Secondary | ICD-10-CM | POA: Diagnosis present

## 2022-12-21 DIAGNOSIS — R200 Anesthesia of skin: Secondary | ICD-10-CM | POA: Diagnosis not present

## 2022-12-21 DIAGNOSIS — N179 Acute kidney failure, unspecified: Secondary | ICD-10-CM | POA: Diagnosis present

## 2022-12-21 DIAGNOSIS — I119 Hypertensive heart disease without heart failure: Secondary | ICD-10-CM | POA: Diagnosis present

## 2022-12-21 LAB — HEMOGLOBIN A1C
Hgb A1c MFr Bld: 5.4 % (ref 4.8–5.6)
Mean Plasma Glucose: 108 mg/dL

## 2022-12-21 MED ORDER — KETOROLAC TROMETHAMINE 30 MG/ML IJ SOLN
30.0000 mg | Freq: Once | INTRAMUSCULAR | Status: AC
  Administered 2022-12-21: 30 mg via INTRAVENOUS
  Filled 2022-12-21: qty 1

## 2022-12-21 NOTE — Progress Notes (Signed)
Physical Therapy Evaluation Patient Details Name: Melissa [name] MRN: [id number] DOB: 1970-06-26 Today's Date: 12/21/2022  History of Present Illness  Melissa [name] is a 52 y.o. female with medical history significant of hypertension, hyperlipidemia, GERD, IBS, POTS (postural orthostatic tachycardia syndrome), chronic pain, migraine, fibromyalgia, anxiety, bipolar, panic disorder, who presents with dizziness, chest pain, diarrhea.   Clinical Impression  Patient seated at EOB upon arrival, agreeable to PT evaluation. Patient's functional baseline is IND. With oculomotor testing, patient demonstrating a positive HIT noted on L side. Patient reporting increased increased sensitivity with head/body movement, visually stimulating environments (stairwells), and with VOR activities. In terms of functional mobility, patient is supervision with transfers, ambulation, and stairs. Mild instability noted initially but improvements noted with increased time/distance. Pt having increased symptoms with dynamic gait tasks (head movement). Retropulsion with romberg eyes closed testing. Patient demonstrating no acute skilled PT needs, and will d/c in house. Patient will benefit from continued skilled PT services to address impairments at next level of care.       Assistance Recommended at Discharge Intermittent Supervision/Assistance  If plan is discharge home, recommend the following:  Can travel by private vehicle  A little help with walking and/or transfers;A little help with bathing/dressing/bathroom;Assist for transportation;Help with stairs or ramp for entrance        Equipment Recommendations None recommended by PT  Recommendations for Other Services       Functional Status Assessment Patient has had a recent decline in their functional status and demonstrates the ability to make significant improvements in function in a reasonable and predictable amount of time.     Precautions / Restrictions  Precautions Precautions: None Restrictions Weight Bearing Restrictions: No      Mobility  Bed Mobility Overal bed mobility: Independent             General bed mobility comments: Patient supine to sit to EOB with IND; denies increase in dizziness with bed mobility    Transfers Overall transfer level: Needs assistance Equipment used: None Transfers: Sit to/from Stand Sit to Stand: Supervision           General transfer comment: supervision for sit > stand from EOB, increased postural sway noted.    Ambulation/Gait Ambulation/Gait assistance: Supervision Gait Distance (Feet): 150 Feet Assistive device: None Gait Pattern/deviations: Step-through pattern, Staggering right, Staggering left Gait velocity: decreased     General Gait Details: mild intermittent staggers noted, stability improved with increasing distance.  Stairs Stairs: Yes Stairs assistance: Supervision Stair Management: One rail Right, Forwards, Alternating pattern Number of Stairs: 14 General stair comments: able to physically complete stairs with Min Guard for stability and use of rail; Pt reports verbal increase in visual senstivity while present in stairwell.  Wheelchair Mobility     Tilt Bed    Modified Rankin (Stroke Patients Only)       Balance Overall balance assessment: Needs assistance Sitting-balance support: No upper extremity supported, Feet supported Sitting balance-Leahy Scale: Normal     Standing balance support: No upper extremity supported, During functional activity Standing balance-Leahy Scale: Good Standing balance comment: mild postural sway noted           Rhomberg - Eyes Closed:  (unable to complete; heavy posterior lean required Min A to maintain balance. minimal balance strategies) High level balance activites: Head turns High Level Balance Comments: gait with horizontal/vertical head turns in hallway, increase in imbalance with increased symptoms. pt  symptomatic with horizontal > vertical  Vestibular Asssessment    Non-Vestibular symptoms: headaches   Aggravating factors: Induced by motion: turning body quickly and turning head quickly and Worse outside or in busy environment   Relieving factors: head stationary and lying supine   Oculomotor Exam:   Ocular Alignment: normal   Ocular ROM: No Limitations   Spontaneous Nystagmus: absent   Gaze-Induced Nystagmus: absent   Smooth Pursuits: intact   Saccades: hypometric/undershoots and inconsistent finding    Vestibular-Ocular Reflex (VOR):   Slow VOR: Normal   VOR Cancellation: Comment: normal; patient symptomatic with completion   Head-Impulse Test: HIT Right: negative HIT Left: positive     Orthostatics: not done; per chart negative this AM       Pertinent Vitals/Pain Pain Assessment Pain Assessment: No/denies pain    Home Living Family/patient expects to be discharged to:: Private residence Living Arrangements: Spouse/significant other Available Help at Discharge: Family Type of Home: House Home Access: Stairs to enter   Secretary/administrator of Steps: 1   Home Layout: One level Home Equipment: None      Prior Function Prior Level of Function : Independent/Modified Independent             Mobility Comments: Per patient reports she is IND, but does endorse history of imbalance with eyes closed; "thats been present for as long as I can remember".       Hand Dominance   Dominant Hand: Right    Extremity/Trunk Assessment                Communication   Communication: No difficulties  Cognition Arousal/Alertness: Awake/alert Behavior During Therapy: WFL for tasks assessed/performed Overall Cognitive Status: Within Functional Limits for tasks assessed                                          General Comments      Exercises     Assessment/Plan    PT Assessment All further PT needs can be met in the next venue  of care  PT Problem List Decreased activity tolerance;Decreased balance       PT Treatment Interventions      PT Goals (Current goals can be found in the Care Plan section)       Frequency       Co-evaluation               AM-PAC PT "6 Clicks" Mobility  Outcome Measure Help needed turning from your back to your side while in a flat bed without using bedrails?: None Help needed moving from lying on your back to sitting on the side of a flat bed without using bedrails?: None Help needed moving to and from a bed to a chair (including a wheelchair)?: A Little Help needed standing up from a chair using your arms (e.g., wheelchair or bedside chair)?: A Little Help needed to walk in hospital room?: A Little Help needed climbing 3-5 steps with a railing? : A Little 6 Click Score: 20    End of Session Equipment Utilized During Treatment: Gait belt Activity Tolerance: Patient tolerated treatment well;Other (comment) (intermittent increase in dizziness) Patient left: in bed;Other (comment);with call bell/phone within reach;with family/visitor present (MD present in room) Nurse Communication: Mobility status PT Visit Diagnosis: Unsteadiness on feet (R26.81);Dizziness and giddiness (R42)    Time: 7829-5621 PT Time Calculation (min) (ACUTE ONLY): 23 min   Charges:  PT Evaluation $PT Eval Moderate Complexity: 1 Mod   PT General Charges $$ ACUTE PT VISIT: 1 Visit         Creed Copper Fairly, PT, DPT 12/21/22 2:56 PM

## 2022-12-21 NOTE — Discharge Summary (Signed)
Physician Discharge Summary   Patient: Melissa [name] MRN: [id number] DOB: 1970-08-10  Admit date:     12/19/2022  Discharge date: 12/21/22  Discharge Physician: Loyce Dys   PCP: Lorre Munroe, NP    Chest pain   History of POTS (postural orthostatic tachycardia syndrome) and Dizziness: CT head negative.  Her dizziness likely related to dehydration secondary to diarrhea. -Fall precaution Continue IV fluid Continue meclizine Continue to monitor orthostatic vitals   Chronic migraine without aura without status migrainosus, not intractable: Patient is getting Ajovy injection at home -Continue sumatriptan   Essential hypertension We will hold verapamil due to soft blood pressure IV hydralazine as needed   Right facial numbness: This is chronic intermittent issue, patient is following up with neurologist Outpatient follow-up with neurologist   AKI (acute kidney injury) [facility]): Mild AKI.  Likely due to dehydration -IV fluid as above -Avoid using renal toxic medications   Diarrhea -Check C. difficile if patient has diarrhea again   Bipolar disorder (HCC) and anxiety: -Hold Wellbutrin since we are not sure if the increased Wellbutrin dose caused her symptoms -Continue Klonopin, Luvox,    DVT ppx:  SQ Lovenox  Hospital Course: From HPI DORRI BERGARA is a 51 y.o. female with medical history significant of hypertension, hyperlipidemia, GERD, IBS, POTS (postural orthostatic tachycardia syndrome), chronic pain, migraine, fibromyalgia, anxiety, bipolar, panic disorder, who presents with dizziness, chest pain, diarrhea.   Patient states that she had more than 10 episodes of watery diarrhea the day before presentation which is resolved now.  She developed lightheadedness and dizziness today.  Denies symptoms of UTI.  Patient states that her mother passed away in 11/18/2023and recently had funeral.  She feels stressful.  Her doctor increased her Wellbutrin dose from 150 to 300 mg  since Thursday.  Chest x-ray negative.  CT of head is negative.  CTA negative for PE.  She underwent IV fluid hydration as well as telemetry monitoring with some improvement to her dizziness.  Seen by PT underwent vestibular evaluation that was normal.  Patient was also seen by a neurologist and has been cleared for discharge today to follow-up with her outpatient neurologist and psychiatrist    Consultants: Neurology Procedures performed: None Disposition: Home Diet recommendation:  Discharge Diet Orders (From admission, onward)     Start     Ordered   12/21/22 0000  Diet - low sodium heart healthy        12/21/22 1456           Cardiac diet DISCHARGE MEDICATION: Allergies as of 12/21/2022       Reactions   Lamotrigine Hives, Swelling   Abilify [aripiprazole] Other (See Comments)   Weight gain, did not help,    Azithromycin Rash   Depakote [divalproex Sodium] Other (See Comments)   Hair loss   Erythromycin Base Swelling   Lithium Rash   Penicillins Rash   Has patient had a PCN reaction causing immediate rash, facial/tongue/throat swelling, SOB or lightheadedness with hypotension: Yes- swelling, rash  Has patient had a PCN reaction causing severe rash involving mucus membranes or skin necrosis: Yes Has patient had a PCN reaction that required hospitalization No Has patient had a PCN reaction occurring within the last 10 years: No If all of the above answers are "NO", then may proceed with Cephalosporin use.   Pregabalin Palpitations   Manic symptoms, no sleep x's 3 days, no appetite.   Seroquel [quetiapine Fumarate] Other (See Comments)  Irritable   Sulfa Antibiotics Rash   Tetanus Toxoids Other (See Comments)   Swelling to injection site   Tramadol Nausea And Vomiting   Ziprasidone Hcl Other (See Comments)   Heat/cold intolerance.         Medication List     STOP taking these medications    mirtazapine 7.5 MG tablet Commonly known as: REMERON    norgestimate-ethinyl estradiol 0.25-35 MG-MCG tablet Commonly known as: Sprintec 28       TAKE these medications    acetaminophen 500 MG tablet Commonly known as: TYLENOL Take 1,000 mg by mouth daily as needed for mild pain.   Ajovy 225 MG/1.5ML Soaj Generic drug: Fremanezumab-vfrm Inject 225 mg into the skin every 30 (thirty) days.   buPROPion 150 MG 24 hr tablet Commonly known as: WELLBUTRIN XL Take 1 tablet (150 mg total) by mouth daily.   clonazePAM 1 MG tablet Commonly known as: KLONOPIN Take 1 tablet (1 mg total) by mouth 2 (two) times daily as needed. for anxiety   fexofenadine 180 MG tablet Commonly known as: ALLEGRA Take 180 mg by mouth daily.   fluvoxaMINE 50 MG tablet Commonly known as: LUVOX Take 50-100 mg by mouth at bedtime.   gabapentin 600 MG tablet Commonly known as: NEURONTIN Take 1 tablet (600 mg total) by mouth 3 (three) times daily.   Junel FE 1/20 1-20 MG-MCG tablet Generic drug: norethindrone-ethinyl estradiol-FE Take 1 tablet by mouth daily.   meclizine 25 MG tablet Commonly known as: ANTIVERT Take 1 tablet (25 mg total) by mouth 3 (three) times daily as needed for dizziness.   methocarbamol 750 MG tablet Commonly known as: ROBAXIN TAKE 1 TABLET BY MOUTH EVERY 8 HOURS AS NEEDED FOR MUSCLE SPASMS   naratriptan 2.5 MG tablet Commonly known as: AMERGE TAKE 1 TABLET BY MOUTH AT ONSET OF MIGRAINE. IF RETURNS OR DOES NOT RESOLVE, MAY REPEAT AFTER 4 HOURS. DO NOT EXCEED 2 TABS (5 MG) IN 24 HOURS.   omeprazole 40 MG capsule Commonly known as: PRILOSEC Take 1 capsule (40 mg total) by mouth daily.   ondansetron 8 MG disintegrating tablet Commonly known as: ZOFRAN-ODT Take 0.5 tablets (4 mg total) by mouth every 8 (eight) hours as needed for nausea or vomiting.   verapamil 120 MG CR tablet Commonly known as: CALAN-SR TAKE 1 TABLET BY MOUTH AT BEDTIME        Discharge Exam: Filed Weights   12/19/22 0843  Weight: 72.6 kg   General:  Not in acute distress HEENT: Atraumatic normocephalic Heme: No neck lymph node enlargement. Cardiac: S1/S2, RRR, No murmurs, No gallops or rubs. Respiratory: No rales, wheezing, rhonchi or rubs. GI: Soft, nondistended,  Ext: No pitting leg edema bilaterally. 1+DP/PT pulse bilaterally. Musculoskeletal: No joint deformities, No joint redness or warmth, no limitation of ROM in spin. Skin: No rashes.  Neuro: Alert, oriented X3, cranial nerves II-XII grossly intact, moves all  Psych: Patient is not psychotic, no suicidal or hemocidal ideation  Condition at discharge: good    Discharge time spent: 40 minutes  Signed: Loyce Dys, MD [facility] 12/21/2022

## 2022-12-21 NOTE — Consult Note (Signed)
Neurology Consultation Reason for Consult: Dizziness Requesting Physician: Rosezetta Schlatter  CC: Atypical chest pain initially, and then dizziness  History is obtained from: Patient and chart review  HPI: Melissa [name] is a 52 y.o. female with a past medical history significant for bipolar disorder, fibromyalgia, POTS (patient reports confirmed with tilt table testing), hypertension, irritable bowel syndrome, hypertension on verapamil, chronic migraines (patient of Dr. Lucia Gaskins), IBS  Her Wellbutrin was recently increased from 150-300 in the setting of recent stressors.  She did have some diarrhea prior to admission which self resolved.  However she initially presented to the ED for feeling of chest heaviness.  CTA chest with contrast was obtained for diagnostic evaluation.  Just after this when she attempted to stand up from the CT scanner she had severe dizziness and shakiness with the room spinning sensation.  She was immediately laid back down on the scanner and head CT was performed to rule out acute intracranial process which was negative.  Subsequently she remained quite dizzy particularly with movements or ambulation, but improved with rest, the severity of the dizziness/shakiness has been steadily improving on repeated attempts to move and ambulate; at this time she feels nearly at her baseline.  Cardiac workup was negative (troponins, repeat EKG).  However yesterday while watching HGTV and sitting in a chair she suddenly felt a sensation that all the blood was draining from her head down to her toes.  She called nursing for assistance and reported they told her she looked very pale, but her blood pressure and vital signs were otherwise normal and stable.  The episode self resolved after less than 10 minutes but then recurred involving just her head later in the evening while laying in bed lasting only 1 to 2 minutes.  Otherwise she has not had a recurrence of this sensation.  She did feel like her  fibromyalgia symptoms were worsening and due to being confined on bedrest and did ambulate today.  I evaluated her just after PT evaluation when she was noted to have possible left vestibular hypofunction.  Regarding her history of POTS, she notes that she had been drinking excessive amounts of caffeine to try to self manage her symptoms and after her positive tilt table testing with illumination of caffeine and increase of her salt intake her symptoms essentially resolved.  However since developing hypertension she has been trying to be a little bit more careful about salt.  She has been tolerating verapamil well.  She does still notice that her symptoms will recur when she gets dehydrated as has happened just prior to this admission  She does report a slight headache but this is a typical stress-induced headache, and does not feel migrainous to her and has no alarm signs on review of systems  She does have some chronic intermittent tingling/numbness of her right face and bilateral hands right more than left but notes this has been going on for many years, stable, felt to be possibly related to her fibromyalgia and not concerning to her in any way.  She also reports enhanced physiologic tremor whenever she is in the ED or other stressful medical environments  Premorbid modified rankin scale:      2 - Slight disability. Able to look after own affairs without assistance, but unable to carry out all previous activities.  On disability due to fibromyalgia and POTS  ROS: All other review of systems was negative except as noted in the HPI.   Past Medical History:  Diagnosis  Date   Arthritis    Bipolar disorder (HCC)    Fibromyalgia    on disability   Migraine    Muscle spasm    Pain of right side of body    chronic   Panic attack    POTS (postural orthostatic tachycardia syndrome)    Syncope    recurrent   Uterine perforation    hx of   Past Surgical History:  Procedure Laterality Date    CATARACT EXTRACTION  dec 2014   DILATION AND CURETTAGE OF UTERUS     LASIK  dec 2014   Current Outpatient Medications  Medication Instructions   acetaminophen (TYLENOL) 1,000 mg, Oral, Daily PRN   Ajovy 225 mg, Subcutaneous, Every 30 days   buPROPion (WELLBUTRIN XL) 150 mg, Oral, Daily   clonazePAM (KLONOPIN) 1 mg, Oral, 2 times daily PRN, for anxiety   fexofenadine (ALLEGRA) 180 mg, Oral, Daily   fluvoxaMINE (LUVOX) 50-100 mg, Oral, Daily at bedtime   gabapentin (NEURONTIN) 600 mg, Oral, 3 times daily   JUNEL FE 1/20 1-20 MG-MCG tablet 1 tablet, Oral, Daily   meclizine (ANTIVERT) 25 mg, Oral, 3 times daily PRN   methocarbamol (ROBAXIN) 750 mg, Oral, Every 8 hours PRN   mirtazapine (REMERON) 7.5 mg, Oral, Daily at bedtime   naratriptan (AMERGE) 2.5 MG tablet TAKE 1 TABLET BY MOUTH AT ONSET OF MIGRAINE. IF RETURNS OR DOES NOT RESOLVE, MAY REPEAT AFTER 4 HOURS. DO NOT EXCEED 2 TABS (5 MG) IN 24 HOURS.   norgestimate-ethinyl estradiol (SPRINTEC 28) 0.25-35 MG-MCG tablet 1 tablet, Oral, Daily   omeprazole (PRILOSEC) 40 mg, Oral, Daily   ondansetron (ZOFRAN-ODT) 4 mg, Oral, Every 8 hours PRN   verapamil (CALAN-SR) 120 MG CR tablet TAKE 1 TABLET BY MOUTH AT BEDTIME    Family History  Problem Relation Age of Onset   Diabetes Mother    Hypertension Mother    Brain cancer Father    Healthy Sister    Ovarian cancer Maternal Grandmother        metastasized   Lung cancer Maternal Grandfather    Cancer Paternal Grandmother        not sure of the type   Cancer Paternal Grandfather        not sure of the type   Migraines Neg Hx     Social History:  reports that she has never smoked. She has never used smokeless tobacco. She reports that she does not drink alcohol and does not use drugs.   Exam: Current vital signs: BP 122/67 (BP Location: Right Arm)   Pulse 92   Temp 98.4 F (36.9 C)   Resp 20   Ht 5\' 9"  (1.753 m)   Wt 72.6 kg   SpO2 100%   BMI 23.63 kg/m  Vital signs in last  24 hours: Temp:  [98.1 F (36.7 C)-98.4 F (36.9 C)] 98.4 F (36.9 C) (07/02 1236) Pulse Rate:  [64-92] 92 (07/02 1236) Resp:  [18-20] 20 (07/02 1236) BP: (122-135)/(57-73) 122/67 (07/02 1236) SpO2:  [99 %-100 %] 100 % (07/02 1236)   Physical Exam  Constitutional: Appears well-developed and well-nourished.  Psych: Affect appropriate to situation, mildly anxious but cooperative and pleasant Eyes: No scleral injection HENT: No oropharyngeal obstruction.  MSK: no joint deformities.  Cardiovascular: Perfusing extremities well Respiratory: Effort normal, non-labored breathing GI: No distension. There is no tenderness.  Skin: Warm dry and intact visible skin  Neuro: Mental Status: Patient is awake, alert, oriented to person, place, month,  year, and situation. Patient is able to give a clear and coherent history. No signs of aphasia or neglect Cranial Nerves: II: Visual Fields are full. Pupils are equal, round, and reactive to light, 5.5 mm to 2 mm bilaterally.  Normal funduscopic exam bilaterally III,IV, VI: EOMI without ptosis or diploplia.  No nystagmus. V: Facial sensation is symmetric to temperature VII: Facial movement is symmetric.  VIII: hearing is intact to voice.  Symmetric on tuning fork testing X: Uvula elevates symmetrically XI: Shoulder shrug is symmetric. XII: tongue is midline without atrophy or fasciculations.   Vestibular testing: Inconsistent head impulse testing on my evaluation, inconsistent catch-up saccades on both directions.  Negative test of skew, no nystagmus.  Negative Dix-Hallpike bilaterally.  With eyes closed sitting on the side of the bed patient leans backwards, but reports this is chronic and baseline for her  Motor: Tone is normal. Bulk is normal. 5/5 strength was present in all four extremities.  Sensory: Sensation is symmetric to light touch and temperature in the arms and legs. Deep Tendon Reflexes: 2+ and symmetric in the brachioradialis and  patellae, brisk but not pathologic Cerebellar: FNF and HKS are intact bilaterally with a baseline enhanced physiologic tremor which she reports is common during medical evaluations Gait:  Steady gait walking with physical therapy which I observed.  She did report symptoms of dizziness with shaking her head side-to-side while walking more than shaking her head up and down while walking.  However otherwise she is ambulating at baseline per her report and no deficits on my observation.  No significant retropulsion seen on casual observation of her gait with eyes open   I have reviewed labs in epic and the results pertinent to this consultation are:  Basic Metabolic Panel: Recent Labs  Lab 12/19/22 0846 12/20/22 0456  NA 138 140  K 3.4* 3.5  CL 109 111  CO2 22 22  GLUCOSE 158* 84  BUN 10 8  CREATININE 1.22* 0.97  CALCIUM 8.3* 7.9*  MG 2.2  --     CBC: Recent Labs  Lab 12/19/22 0846 12/20/22 0456  WBC 8.5 8.1  HGB 14.3 12.4  HCT 44.1 37.9  MCV 88.4 87.7  PLT 290 266    Coagulation Studies: No results for input(s): "LABPROT", "INR" in the last 72 hours.    I have reviewed the images obtained:  Head CT personally reviewed, agree with radiology no acute intracranial process  CTA chest There is no evidence of pulmonary artery embolism. There is no evidence of thoracic aortic dissection. There is no focal pulmonary consolidation. There is no pleural effusion or pneumothorax. There are faint ground-glass densities with mosaic pattern in the lung fields, more so in the upper lung fields. This finding may suggest scarring and small airway disease. There is significant interval improvement in aeration in both lungs in comparison with the previous CT.  Impression: Atypical movement induced dizziness.  Suspect sympathetic overactivity in the setting of her known POTS and fibromyalgia, exacerbated by recent dehydration potentially due to sensation from iodinated contrast given  the onset of symptoms just after CTA chest was completed.  Overall she has had steady gradual improvement of her symptoms other than an episode of flushing with a minor recurrence.  Her neurological examination is very reassuring and not consistent with a central etiology  Recommendations: -Vestibular PT evaluation -Continued outpatient follow-up with Dr. Lucia Gaskins -Return precautions, impression, plan and exam findings discussed with patient and she is in agreement with discharge from neurologic  perspective -Inpatient neurology will sign off at this time but will be available as needed, patient discussed with Dr. Meriam Sprague initially in person and then via secure chat  Brooke Dare MD-PhD [facility] [phone] [facility] coverage for [facility] is from 8 AM to 4 AM in-house and 4 PM to 8 PM by telephone/video. 8 PM to 8 AM emergent questions or overnight urgent questions should be addressed to Teleneurology On-call or [facility] neurohospitalist; contact information can be found on AMION  Greater than 80 minutes spent in care of this patient, majority at bedside

## 2022-12-22 ENCOUNTER — Telehealth: Payer: Self-pay | Admitting: *Deleted

## 2022-12-22 NOTE — Transitions of Care (Post Inpatient/ED Visit) (Signed)
12/22/2022  Name: Melissa [name] MRN: [id number] DOB: [birth year]  Today's TOC FU Call Status: Today's TOC FU Call Status:: Successful TOC FU Call Competed TOC FU Call Complete Date: 12/22/22  Transition Care Management Follow-up Telephone Call Date of Discharge: 12/21/22 Discharge Facility: [facility]) Type of Discharge: Inpatient Admission Primary Inpatient Discharge Diagnosis:: chest pain How have you been since you were released from the hospital?: Better Any questions or concerns?: No  Items Reviewed: Did you receive and understand the discharge instructions provided?: Yes Medications obtained,verified, and reconciled?: Partial Review Completed Reason for Partial Mediation Review: patient has medication supplied. Stated she was doing something and dceclined a call back Any new allergies since your discharge?: No Dietary orders reviewed?: No Do you have support at home?: Yes People in Home: spouse Name of Support/Comfort Primary Source: [name]  Medications Reviewed Today: Medications Reviewed Today     Reviewed by Shon Hale, CPhT (Pharmacy Technician) on 12/19/22 at 1532  Med List Status: Complete   Medication Order Taking? Sig Documenting Provider Last Dose Status Informant  acetaminophen (TYLENOL) 500 MG tablet [id number] Yes Take 1,000 mg by mouth daily as needed for mild pain. [provider] unk Active Self  buPROPion (WELLBUTRIN XL) 150 MG 24 hr tablet [id number] Yes Take 1 tablet (150 mg total) by mouth daily. Dianne Dun, MD 12/19/2022 Active Self  clonazePAM (KLONOPIN) 1 MG tablet [id number] Yes Take 1 tablet (1 mg total) by mouth 2 (two) times daily as needed. for anxiety Dianne Dun, MD unk Active Self  fexofenadine [facility]) 180 MG tablet [id number] Yes Take 180 mg by mouth daily. [provider] unk Active   fluvoxaMINE (LUVOX) 50 MG tablet [id number] Yes Take 50-100 mg by mouth at bedtime.  [provider]  12/18/2022 Active Self  Fremanezumab-vfrm (AJOVY) 225 MG/1.5ML SOAJ [id number]  Inject 225 mg into the skin every 30 (thirty) days. Anson Fret, MD  Active   gabapentin (NEURONTIN) 600 MG tablet [id number] Yes Take 1 tablet (600 mg total) by mouth 3 (three) times daily. Lorre Munroe, NP 12/19/2022 Active   JUNEL FE 1/20 1-20 MG-MCG tablet [id number] Yes Take 1 tablet by mouth daily. [provider] 12/19/2022 Active   methocarbamol (ROBAXIN) 750 MG tablet [id number] Yes TAKE 1 TABLET BY MOUTH EVERY 8 HOURS AS NEEDED FOR MUSCLE SPASMS Baity, [name], NP unk Active   mirtazapine (REMERON) 7.5 MG tablet [id number] No Take 1 tablet (7.5 mg total) by mouth at bedtime.  Patient not taking: Reported on 12/19/2022   [name], DO Completed Course Active   naratriptan (AMERGE) 2.5 MG tablet [id number] Yes TAKE 1 TABLET BY MOUTH AT ONSET OF MIGRAINE. IF RETURNS OR DOES NOT RESOLVE, MAY REPEAT AFTER 4 HOURS. DO NOT EXCEED 2 TABS (5 MG) IN 24 HOURS. Anson Fret, MD unk Active   norgestimate-ethinyl estradiol (SPRINTEC 28) 0.25-35 MG-MCG tablet [id number] No Take 1 tablet by mouth daily.  Patient not taking: Reported on 12/19/2022   Dianne Dun, MD Not Taking Active Self  omeprazole (PRILOSEC) 40 MG capsule [id number] Yes Take 1 capsule (40 mg total) by mouth daily. Lorre Munroe, NP 12/19/2022 Active   ondansetron (ZOFRAN-ODT) 8 MG disintegrating tablet [id number] Yes Take 0.5 tablets (4 mg total) by mouth every 8 (eight) hours as needed for nausea or vomiting. Anson Fret, MD unk Active   verapamil (CALAN-SR) 120 MG CR tablet [id number] Yes TAKE 1 TABLET BY MOUTH AT BEDTIME  Lorre Munroe, NP 12/18/2022 Active   Med List Note Concepcion Elk, RN 06/02/15 1023): 06-02-15 Refuses UDS            Home Care and Equipment/Supplies: Were Home Health Services Ordered?: NA Any new equipment or medical supplies ordered?: NA  Functional Questionnaire: Do you need assistance  with bathing/showering or dressing?: No Do you need assistance with meal preparation?: No Do you need assistance with eating?: No Do you have difficulty maintaining continence: No Do you need assistance with getting out of bed/getting out of a chair/moving?: No Do you have difficulty managing or taking your medications?: No  Follow up appointments reviewed: PCP Follow-up appointment confirmed?: No MD Provider Line [phone] Given: No (Patient refused RN to assist with appointment. She stated she will follow up with PCP) Specialist Hospital Follow-up appointment confirmed?: NA Do you need transportation to your follow-up appointment?: No Do you understand care options if your condition(s) worsen?: Yes-patient verbalized understanding   Interventions Today    Flowsheet Row Most Recent Value  General Interventions   General Interventions Discussed/Reviewed General Interventions Discussed, General Interventions Reviewed  Pharmacy Interventions   Pharmacy Dicussed/Reviewed Pharmacy Topics Discussed  [patient has medication. declined callback to go over medications]      TOC Interventions Today    Flowsheet Row Most Recent Value  TOC Interventions   TOC Interventions Discussed/Reviewed TOC Interventions Discussed, TOC Interventions Reviewed  [Patient stated she will follow up PCP herself. Declined RN to assist]        Gean Maidens BSN RN [facility] [phone]

## 2022-12-27 ENCOUNTER — Encounter: Payer: Self-pay | Admitting: Internal Medicine

## 2022-12-27 ENCOUNTER — Other Ambulatory Visit: Payer: Self-pay | Admitting: Internal Medicine

## 2022-12-27 MED ORDER — ETODOLAC ER 400 MG PO TB24: 400.0000 mg | ORAL_TABLET | Freq: Every day | ORAL | 1 refills | Status: DC | PRN

## 2022-12-28 NOTE — Telephone Encounter (Signed)
Requested by interface surescripts. Last OV 2 months ago . Future visit in 5 months. Requested Prescriptions  Pending Prescriptions Disp Refills   verapamil (CALAN-SR) 120 MG CR tablet [Pharmacy Med Name: VERAPAMIL HCL ER 120 MG TAB] 90 tablet 0    Sig: TAKE 1 TABLET BY MOUTH AT BEDTIME     Cardiovascular: Calcium Channel Blockers 3 Passed - 12/27/2022 12:39 PM      Passed - ALT in normal range and within 360 days    ALT  Date Value Ref Range Status  12/19/2022 15 0 - 44 U/L Final   SGPT (ALT)  Date Value Ref Range Status  06/17/2014 16 U/L Final    Comment:    14-63 NOTE: New Reference Range 01/08/14          Passed - AST in normal range and within 360 days    AST  Date Value Ref Range Status  12/19/2022 21 15 - 41 U/L Final   SGOT(AST)  Date Value Ref Range Status  06/17/2014 11 (L) 15 - 37 Unit/L Final         Passed - Cr in normal range and within 360 days    Creat  Date Value Ref Range Status  12/10/2022 1.15 (H) 0.50 - 1.03 mg/dL Final   Creatinine, Ser  Date Value Ref Range Status  12/20/2022 0.97 0.44 - 1.00 mg/dL Final         Passed - Last BP in normal range    BP Readings from Last 1 Encounters:  12/21/22 122/67         Passed - Last Heart Rate in normal range    Pulse Readings from Last 1 Encounters:  12/21/22 92         Passed - Valid encounter within last 6 months    Recent Outpatient Visits           2 weeks ago Encounter for general adult medical examination with abnormal findings   [place] [facility] [place], [name], NP   2 months ago Fibromyalgia   [place] [facility] [place], [name], NP   4 months ago Fibromyalgia   [place] [facility] [name], DO   4 months ago Fibromyalgia   [place] [facility] [place], [name], NP   6 months ago Chronic migraine without aura without status migrainosus, not intractable   [place] [facility] [place], [name], NP       Future Appointments             In 5 months Baity, [name], NP [place] [facility]

## 2022-12-30 ENCOUNTER — Other Ambulatory Visit: Payer: Self-pay | Admitting: Internal Medicine

## 2022-12-30 MED ORDER — ETODOLAC 400 MG PO TABS: 400.0000 mg | ORAL_TABLET | Freq: Every day | ORAL | 0 refills | Status: DC | PRN

## 2023-01-13 DIAGNOSIS — F3132 Bipolar disorder, current episode depressed, moderate: Secondary | ICD-10-CM | POA: Diagnosis not present

## 2023-01-13 DIAGNOSIS — Z79899 Other long term (current) drug therapy: Secondary | ICD-10-CM | POA: Diagnosis not present

## 2023-01-13 DIAGNOSIS — G478 Other sleep disorders: Secondary | ICD-10-CM | POA: Diagnosis not present

## 2023-01-13 DIAGNOSIS — R5382 Chronic fatigue, unspecified: Secondary | ICD-10-CM | POA: Diagnosis not present

## 2023-01-13 DIAGNOSIS — F411 Generalized anxiety disorder: Secondary | ICD-10-CM | POA: Diagnosis not present

## 2023-01-27 DIAGNOSIS — N644 Mastodynia: Secondary | ICD-10-CM | POA: Diagnosis not present

## 2023-01-27 DIAGNOSIS — Z3041 Encounter for surveillance of contraceptive pills: Secondary | ICD-10-CM | POA: Diagnosis not present

## 2023-02-03 ENCOUNTER — Other Ambulatory Visit: Payer: Self-pay | Admitting: Obstetrics and Gynecology

## 2023-02-03 DIAGNOSIS — N644 Mastodynia: Secondary | ICD-10-CM

## 2023-02-10 ENCOUNTER — Other Ambulatory Visit: Payer: Self-pay | Admitting: *Deleted

## 2023-02-10 ENCOUNTER — Inpatient Hospital Stay
Admission: RE | Admit: 2023-02-10 | Discharge: 2023-02-10 | Disposition: A | Discharge status: Home / Self Care | Payer: Self-pay | Source: Ambulatory Visit | Attending: Internal Medicine | Admitting: Internal Medicine

## 2023-02-10 DIAGNOSIS — Z1231 Encounter for screening mammogram for malignant neoplasm of breast: Secondary | ICD-10-CM

## 2023-02-17 ENCOUNTER — Other Ambulatory Visit: Payer: Self-pay | Admitting: Internal Medicine

## 2023-02-17 DIAGNOSIS — F3132 Bipolar disorder, current episode depressed, moderate: Secondary | ICD-10-CM | POA: Diagnosis not present

## 2023-02-17 DIAGNOSIS — F411 Generalized anxiety disorder: Secondary | ICD-10-CM | POA: Diagnosis not present

## 2023-02-17 DIAGNOSIS — Z79899 Other long term (current) drug therapy: Secondary | ICD-10-CM | POA: Diagnosis not present

## 2023-02-17 DIAGNOSIS — G478 Other sleep disorders: Secondary | ICD-10-CM | POA: Diagnosis not present

## 2023-02-17 DIAGNOSIS — R5382 Chronic fatigue, unspecified: Secondary | ICD-10-CM | POA: Diagnosis not present

## 2023-02-18 ENCOUNTER — Ambulatory Visit (INDEPENDENT_AMBULATORY_CARE_PROVIDER_SITE_OTHER): Payer: Medicare HMO | Admitting: Internal Medicine

## 2023-02-18 ENCOUNTER — Encounter: Payer: Self-pay | Admitting: Internal Medicine

## 2023-02-18 VITALS — BP 130/72 | HR 93 | Temp 96.2°F | Wt 156.0 lb

## 2023-02-18 DIAGNOSIS — L659 Nonscarring hair loss, unspecified: Secondary | ICD-10-CM | POA: Diagnosis not present

## 2023-02-18 DIAGNOSIS — L658 Other specified nonscarring hair loss: Secondary | ICD-10-CM | POA: Diagnosis not present

## 2023-02-18 NOTE — Progress Notes (Signed)
Subjective:    Patient ID: Volney Presser, female    DOB: [birth year], 52 y.o.   MRN: [id number]  HPI  Patient presents to clinic today with complaints of her hair falling out.  She noticed this since may.  She has not noticed any bald spots. She has tried 3 different shampoos, biotin with minimal relief of symptoms. She dyes her hair every 6 weeks but has been doing this for years and had not changed products.  Review of Systems     Past Medical History:  Diagnosis Date   Arthritis    Bipolar disorder (HCC)    Fibromyalgia    on disability   Migraine    Muscle spasm    Pain of right side of body    chronic   Panic attack    POTS (postural orthostatic tachycardia syndrome)    Syncope    recurrent   Uterine perforation    hx of    Current Outpatient Medications  Medication Sig Dispense Refill   acetaminophen (TYLENOL) 500 MG tablet Take 1,000 mg by mouth daily as needed for mild pain.     buPROPion (WELLBUTRIN XL) 150 MG 24 hr tablet Take 1 tablet (150 mg total) by mouth daily. 90 tablet 3   clonazePAM (KLONOPIN) 1 MG tablet Take 1 tablet (1 mg total) by mouth 2 (two) times daily as needed. for anxiety 60 tablet 0   etodolac (LODINE) 400 MG tablet Take 1 tablet (400 mg total) by mouth daily as needed. 30 tablet 0   fexofenadine (ALLEGRA) 180 MG tablet Take 180 mg by mouth daily.     fluvoxaMINE (LUVOX) 50 MG tablet Take 50-100 mg by mouth at bedtime.      Fremanezumab-vfrm (AJOVY) 225 MG/1.5ML SOAJ Inject 225 mg into the skin every 30 (thirty) days. 1.5 mL 11   gabapentin (NEURONTIN) 600 MG tablet TAKE 1 TABLET BY MOUTH 3 TIMES DAILY 270 tablet 1   JUNEL FE 1/20 1-20 MG-MCG tablet Take 1 tablet by mouth daily.     meclizine (ANTIVERT) 25 MG tablet Take 1 tablet (25 mg total) by mouth 3 (three) times daily as needed for dizziness. 30 tablet 0   methocarbamol (ROBAXIN) 750 MG tablet TAKE 1 TABLET BY MOUTH EVERY 8 HOURS AS NEEDED FOR MUSCLE SPASMS 270 tablet 0   naratriptan  (AMERGE) 2.5 MG tablet TAKE 1 TABLET BY MOUTH AT ONSET OF MIGRAINE. IF RETURNS OR DOES NOT RESOLVE, MAY REPEAT AFTER 4 HOURS. DO NOT EXCEED 2 TABS (5 MG) IN 24 HOURS. 9 tablet 3   omeprazole (PRILOSEC) 40 MG capsule Take 1 capsule (40 mg total) by mouth daily. 90 capsule 1   ondansetron (ZOFRAN-ODT) 8 MG disintegrating tablet Take 0.5 tablets (4 mg total) by mouth every 8 (eight) hours as needed for nausea or vomiting. 30 tablet 11   verapamil (CALAN-SR) 120 MG CR tablet TAKE 1 TABLET BY MOUTH AT BEDTIME 90 tablet 0   No current facility-administered medications for this visit.    Allergies  Allergen Reactions   Lamotrigine Hives and Swelling   Abilify [Aripiprazole] Other (See Comments)    Weight gain, did not help,    Azithromycin Rash   Depakote [Divalproex Sodium] Other (See Comments)    Hair loss   Erythromycin Base Swelling   Lithium Rash   Penicillins Rash    Has patient had a PCN reaction causing immediate rash, facial/tongue/throat swelling, SOB or lightheadedness with hypotension: Yes- swelling, rash  Has patient had  a PCN reaction causing severe rash involving mucus membranes or skin necrosis: Yes Has patient had a PCN reaction that required hospitalization No Has patient had a PCN reaction occurring within the last 10 years: No If all of the above answers are "NO", then may proceed with Cephalosporin use.    Pregabalin Palpitations    Manic symptoms, no sleep x's 3 days, no appetite.   Seroquel [Quetiapine Fumarate] Other (See Comments)    Irritable   Sulfa Antibiotics Rash   Tetanus Toxoids Other (See Comments)    Swelling to injection site   Tramadol Nausea And Vomiting   Ziprasidone Hcl Other (See Comments)    Heat/cold intolerance.     Family History  Problem Relation Age of Onset   Diabetes Mother    Hypertension Mother    Brain cancer Father    Healthy Sister    Ovarian cancer Maternal Grandmother        metastasized   Lung cancer Maternal Grandfather     Cancer Paternal Grandmother        not sure of the type   Cancer Paternal Grandfather        not sure of the type   Migraines Neg Hx     Social History   Socioeconomic History   Marital status: Married    Spouse name: [name]   Number of children: 0   Years of education: some college   Highest education level: Not on file  Occupational History    Employer: OTHER    Comment: disabled  Tobacco Use   Smoking status: Never   Smokeless tobacco: Never  Vaping Use   Vaping status: Never Used  Substance and Sexual Activity   Alcohol use: No    Alcohol/week: 0.0 standard drinks of alcohol   Drug use: No   Sexual activity: Yes    Birth control/protection: Surgical    Comment: also on OCPs  Other Topics Concern   Not on file  Social History Narrative   08/28/19   From: [place], moved because her husband was from [place]   Living: with husband [name], since 1990   Work: disability due to fibromyalgia and bipolar disorder      Family: mother in law is nearby, her mother is in [place]; has a sister      Enjoys: crochet, baking, reading      Exercise: walking the dog, yoga - tries to do 3 times a week   Diet: not great, more sweets than she should      Safety   Seat belts: Yes    Guns: Yes  and secure   Safe in relationships: Yes          Right handed   Caffeine: none    Social Determinants of Health   Financial Resource Strain: Not on file  Food Insecurity: No Food Insecurity (12/19/2022)   Hunger Vital Sign    Worried About Running Out of Food in the Last Year: Never true    Ran Out of Food in the Last Year: Never true  Transportation Needs: No Transportation Needs (12/19/2022)   PRAPARE - Administrator, Civil Service (Medical): No    Lack of Transportation (Non-Medical): No  Physical Activity: Not on file  Stress: Not on file  Social Connections: Unknown (02/01/2023)   [facility]   Social Network    Social Network: Not on file  Intimate  Partner Violence: Unknown (02/01/2023)   [facility]  HITS    Physically Hurt: Not on file    Insult or Talk Down To: Not on file    Threaten Physical Harm: Not on file    Scream or Curse: Not on file     Constitutional: Patient reports intermittent headaches, fatigue.  Denies fever, malaise, or abrupt weight changes.  Respiratory: Denies difficulty breathing, shortness of breath, cough or sputum production.   Cardiovascular: Denies chest pain, chest tightness, palpitations or swelling in the hands or feet.  Skin: Patient reports thinning hair.  Denies redness, rashes, lesions or ulcercations.  Neurological: Patient report intermittent dizziness.  Denies difficulty with memory, difficulty with speech or problems with balance and coordination.  Psych: Patient has a history of anxiety and depression.  Denies SI/HI.  No other specific complaints in a complete review of systems (except as listed in HPI above).  Objective:   Physical Exam  BP 130/72 (BP Location: Right Arm, Patient Position: Sitting, Cuff Size: Normal)   Pulse 93   Temp (!) 96.2 F (35.7 C) (Temporal)   Wt 156 lb (70.8 kg)   SpO2 97%   BMI 23.04 kg/m   Wt Readings from Last 3 Encounters:  12/19/22 160 lb (72.6 kg)  12/10/22 161 lb (73 kg)  10/19/22 155 lb (70.3 kg)    General: Appears her stated age, well developed, well nourished in NAD. Cardiovascular: Normal rate. Pulmonary/Chest: Normal effort. Neurological: Alert and oriented.    BMET    Component Value Date/Time   NA 140 12/20/2022 0456   NA 141 06/17/2014 1511   K 3.5 12/20/2022 0456   K 4.0 06/17/2014 1511   CL 111 12/20/2022 0456   CL 109 (H) 06/17/2014 1511   CO2 22 12/20/2022 0456   CO2 23 06/17/2014 1511   GLUCOSE 84 12/20/2022 0456   GLUCOSE 88 06/17/2014 1511   BUN 8 12/20/2022 0456   BUN 11 06/17/2014 1511   CREATININE 0.97 12/20/2022 0456   CREATININE 1.15 (H) 12/10/2022 0821   CALCIUM 7.9 (L) 12/20/2022 0456    CALCIUM 8.4 (L) 06/17/2014 1511   GFRNONAA >60 12/20/2022 0456   GFRNONAA 78 04/28/2020 0905   GFRAA 90 04/28/2020 0905    Lipid Panel     Component Value Date/Time   CHOL 312 (H) 12/19/2022 0846   CHOL 203 (H) 02/04/2012 0540   TRIG 341 (H) 12/19/2022 0846   TRIG 300 (H) 02/04/2012 0540   HDL 56 12/19/2022 0846   HDL 29 (L) 02/04/2012 0540   CHOLHDL 5.6 12/19/2022 0846   VLDL 68 (H) 12/19/2022 0846   VLDL 60 (H) 02/04/2012 0540   LDLCALC 188 (H) 12/19/2022 0846   LDLCALC 179 (H) 12/10/2022 0821   LDLCALC 114 (H) 02/04/2012 0540    CBC    Component Value Date/Time   WBC 8.1 12/20/2022 0456   RBC 4.32 12/20/2022 0456   HGB 12.4 12/20/2022 0456   HGB 14.9 06/17/2014 1511   HCT 37.9 12/20/2022 0456   HCT 46.4 06/17/2014 1511   PLT 266 12/20/2022 0456   PLT 290 06/17/2014 1511   MCV 87.7 12/20/2022 0456   MCV 85 06/17/2014 1511   MCH 28.7 12/20/2022 0456   MCHC 32.7 12/20/2022 0456   RDW 13.2 12/20/2022 0456   RDW 14.7 (H) 06/17/2014 1511   LYMPHSABS 1,274 09/17/2021 1059   LYMPHSABS 4.2 (H) 11/05/2012 0547   MONOABS 0.5 02/21/2021 0445   MONOABS 1.0 (H) 11/05/2012 0547   EOSABS 102 09/17/2021 1059   EOSABS 0.1 11/05/2012  0547   BASOSABS 112 09/17/2021 1059   BASOSABS 0.1 11/05/2012 0547    Hgb A1C Lab Results  Component Value Date   HGBA1C 5.4 12/19/2022           Assessment & Plan:   Thinning hair:  TSH was checked 2 months ago and normal, no indication to repeat We will check iron, serum ferritin and testosterone Discuss options of rogaine versus spironolactone although I do not feel external acting would be in her best interest given her history of POTS Reviewed meds, it appears fluvoxamine and bupropion can cause alopecia but not hair thinning Will follow-up after labs with further recommendation and treatment plan  RTC in 4 months for follow-up of chronic conditions [name], NP

## 2023-02-18 NOTE — Patient Instructions (Signed)
Hair Loss Cover-Ups  There are a variety of products available over-the-counter to camouflage hair loss by covering the bare scalp and coating existing hairs. These products can be used in all types of hair loss, for both men and women. They are intended to stay in until they are washed out. Many of them are also swim-, sweat-, and rub-resistant.   They come in a variety of shades to match your hair color (some even can be used for salt-and-pepper). These products can be purchased at hair salons and over the internet. Here are some of the available choices: Keratin fibers: These bind to the existing hairs through static electricity. They come in a sprinkle can, although you also can buy a pump applicator. Brands include Toppik, XFusion, Megathik, and Nanogen. You can also buy sprays to make them stay better in windy conditions, and applicator combs to use when applying to the frontal hairline. Powder cakes: These are applied using a sponge stick applicator. You dampen the applicator, rub it on the hard-packed powder, and then apply to the scalp. You can shade it at the hairline. An example is DermMatch Topical Shading. Sprays: These are sprayed on from a close distance, and they are very useful for larger bald spots. Brands include Fullmore, ProThik, Spray On Hair, and GLH (Good Looking Hair).    Creams: These are daubed on with your fingers to darken the scalp. You can also use sponge applicators. An example is Couvr Masking Lotion.  

## 2023-02-18 NOTE — Telephone Encounter (Signed)
Requested Prescriptions  Pending Prescriptions Disp Refills   gabapentin (NEURONTIN) 600 MG tablet [Pharmacy Med Name: GABAPENTIN 600 MG TAB] 270 tablet 1    Sig: TAKE 1 TABLET BY MOUTH 3 TIMES DAILY     Neurology: Anticonvulsants - gabapentin Passed - 02/17/2023 10:25 AM      Passed - Cr in normal range and within 360 days    Creat  Date Value Ref Range Status  12/10/2022 1.15 (H) 0.50 - 1.03 mg/dL Final   Creatinine, Ser  Date Value Ref Range Status  12/20/2022 0.97 0.44 - 1.00 mg/dL Final         Passed - Completed PHQ-2 or PHQ-9 in the last 360 days      Passed - Valid encounter within last 12 months    Recent Outpatient Visits           2 months ago Encounter for general adult medical examination with abnormal findings   [place] [facility] [name], [name], NP   4 months ago Fibromyalgia   [place] [facility] [place], [name], NP   6 months ago Fibromyalgia   [place] [facility] [name], DO   6 months ago Fibromyalgia   [place] [facility] [name], [name], NP   8 months ago Chronic migraine without aura without status migrainosus, not intractable   [place] [facility] [place], [name], NP       Future Appointments             Today [place], [name], NP [place] [facility]   In 3 months [place], [name], NP [facility], [name]

## 2023-02-21 LAB — IRON,TIBC AND FERRITIN PANEL
%SAT: 27 % (ref 16–45)
Ferritin: 40 ng/mL (ref 16–232)
Iron: 92 ug/dL (ref 45–160)
TIBC: 338 ug/dL (ref 250–450)

## 2023-02-21 LAB — TESTOSTERONE, TOTAL, LC/MS/MS: Testosterone, Total, LC-MS-MS: 7 ng/dL (ref 2–45)

## 2023-02-25 ENCOUNTER — Ambulatory Visit
Admission: RE | Admit: 2023-02-25 | Discharge: 2023-02-25 | Disposition: A | Discharge status: Home / Self Care | Payer: Medicare HMO | Source: Ambulatory Visit | Attending: Obstetrics and Gynecology

## 2023-02-25 ENCOUNTER — Ambulatory Visit
Admission: RE | Admit: 2023-02-25 | Discharge: 2023-02-25 | Disposition: A | Discharge status: Home / Self Care | Payer: Medicare HMO | Source: Ambulatory Visit | Attending: Obstetrics and Gynecology | Admitting: Obstetrics and Gynecology

## 2023-02-25 DIAGNOSIS — N644 Mastodynia: Secondary | ICD-10-CM | POA: Diagnosis not present

## 2023-02-25 DIAGNOSIS — R92323 Mammographic fibroglandular density, bilateral breasts: Secondary | ICD-10-CM | POA: Diagnosis not present

## 2023-02-28 ENCOUNTER — Other Ambulatory Visit: Payer: Self-pay | Admitting: Internal Medicine

## 2023-02-28 DIAGNOSIS — Z01411 Encounter for gynecological examination (general) (routine) with abnormal findings: Secondary | ICD-10-CM | POA: Diagnosis not present

## 2023-02-28 DIAGNOSIS — Z113 Encounter for screening for infections with a predominantly sexual mode of transmission: Secondary | ICD-10-CM | POA: Diagnosis not present

## 2023-02-28 DIAGNOSIS — Z01419 Encounter for gynecological examination (general) (routine) without abnormal findings: Secondary | ICD-10-CM | POA: Diagnosis not present

## 2023-02-28 DIAGNOSIS — Z309 Encounter for contraceptive management, unspecified: Secondary | ICD-10-CM | POA: Diagnosis not present

## 2023-02-28 DIAGNOSIS — Z124 Encounter for screening for malignant neoplasm of cervix: Secondary | ICD-10-CM | POA: Diagnosis not present

## 2023-02-28 NOTE — Group Note (Deleted)

## 2023-03-01 NOTE — Telephone Encounter (Signed)
Requested Prescriptions  Pending Prescriptions Disp Refills   omeprazole (PRILOSEC) 40 MG capsule [Pharmacy Med Name: OMEPRAZOLE DR 40 MG CAP] 90 capsule 0    Sig: TAKE 1 CAPSULE BY MOUTH DAILY     Gastroenterology: Proton Pump Inhibitors Passed - 02/28/2023 11:00 AM      Passed - Valid encounter within last 12 months    Recent Outpatient Visits           1 week ago Thinning hair   [place] [facility] [place], [name], NP   2 months ago Encounter for general adult medical examination with abnormal findings   [place] [facility] [place], [name], NP   4 months ago Fibromyalgia   [place] [facility] [place], [name], NP   6 months ago Fibromyalgia   [place] [facility] [name], DO   6 months ago Fibromyalgia   [place] [facility] [place], [name], NP       Future Appointments             In 3 months [place], [name], NP  [facility], [facility]

## 2023-03-05 ENCOUNTER — Ambulatory Visit
Admission: EM | Admit: 2023-03-05 | Discharge: 2023-03-05 | Disposition: A | Discharge status: Home / Self Care | Payer: Medicare HMO | Attending: Nurse Practitioner | Admitting: Nurse Practitioner

## 2023-03-05 DIAGNOSIS — K0889 Other specified disorders of teeth and supporting structures: Secondary | ICD-10-CM

## 2023-03-05 MED ORDER — CLINDAMYCIN HCL 300 MG PO CAPS: 300.0000 mg | ORAL_CAPSULE | Freq: Three times a day (TID) | ORAL | 0 refills | Status: AC

## 2023-03-05 MED ORDER — IBUPROFEN 800 MG PO TABS: 800.0000 mg | ORAL_TABLET | Freq: Three times a day (TID) | ORAL | 0 refills | Status: DC

## 2023-03-05 NOTE — Discharge Instructions (Addendum)
Take medication as prescribed. May take over-the-counter Tylenol Extra Strength  or Arthritis Strength tablet 1 to 2 hours after ibuprofen for breakthrough pain. Warm salt water gargles 3-4 times daily until symptoms improve. Continue warm compresses to the affected area to help with pain and discomfort. Please follow-up with your dentist within the next 7 to 10 days if symptoms fail to improve. Follow-up as needed.

## 2023-03-05 NOTE — ED Triage Notes (Signed)
Pt reports right side jaw pain x 1 day

## 2023-03-05 NOTE — ED Provider Notes (Signed)
[facility]    CSN: [id number] Arrival date & time: 03/05/23  8295      History   Chief Complaint No chief complaint on file.   HPI Melissa [name] is a 52 y.o. female.   The history is provided by the patient.   Patient presents for complaints of pain in the right lower portion of her mouth that started over the past 24 hours.  Patient states that she has pain when she chews in the right lower portion of her mouth.  She states that the pain became more intense last evening, causing disruption to her sleep.  She states that she cannot pinpoint a specific tooth that is bothersome for her.  She states that she has taken ibuprofen and Tylenol with minimal relief of her symptoms.  She denies fever, chills, headache, throat pain, jaw swelling, chest pain, abdominal pain, nausea, vomiting, or diarrhea.  Past Medical History:  Diagnosis Date   Arthritis    Bipolar disorder (HCC)    Fibromyalgia    on disability   Migraine    Muscle spasm    Pain of right side of body    chronic   Panic attack    POTS (postural orthostatic tachycardia syndrome)    Syncope    recurrent   Uterine perforation    hx of    Patient Active Problem List   Diagnosis Date Noted   Chronic migraine without aura without status migrainosus, not intractable 02/11/2020   Anxiety 10/23/2019   Essential hypertension 05/09/2017   IBS (irritable bowel syndrome) 09/20/2013   Dyslipidemia 04/03/2013   Osteopenia 04/03/2013   POTS (postural orthostatic tachycardia syndrome) 02/26/2013   Fibromyalgia    Bipolar disorder (HCC)    Gastroesophageal reflux disease without esophagitis 09/10/2011    Past Surgical History:  Procedure Laterality Date   CATARACT EXTRACTION  dec 2014   DILATION AND CURETTAGE OF UTERUS     LASIK  dec 2014    OB History   No obstetric history on file.      Home Medications    Prior to Admission medications   Medication Sig Start Date End Date Taking? Authorizing  Provider  clindamycin (CLEOCIN) 300 MG capsule Take 1 capsule (300 mg total) by mouth 3 (three) times daily for 7 days. 03/05/23 03/12/23 Yes Lynnae Ludemann-Warren, Sadie Haber, NP  ibuprofen (ADVIL) 800 MG tablet Take 1 tablet (800 mg total) by mouth 3 (three) times daily. 03/05/23  Yes Zacary Bauer-Warren, Sadie Haber, NP  acetaminophen (TYLENOL) 500 MG tablet Take 1,000 mg by mouth daily as needed for mild pain.    [provider]  buPROPion (WELLBUTRIN XL) 150 MG 24 hr tablet Take 1 tablet (150 mg total) by mouth daily. 07/17/19   Dianne Dun, MD  clonazePAM (KLONOPIN) 1 MG tablet Take 1 tablet (1 mg total) by mouth 2 (two) times daily as needed. for anxiety 05/26/16   Dianne Dun, MD  etodolac (LODINE) 400 MG tablet Take 1 tablet (400 mg total) by mouth daily as needed. 12/30/22   Lorre Munroe, NP  fexofenadine (ALLEGRA) 180 MG tablet Take 180 mg by mouth daily.    [provider]  fluvoxaMINE (LUVOX) 50 MG tablet Take 50-100 mg by mouth at bedtime.  05/23/17   [provider]  Fremanezumab-vfrm (AJOVY) 225 MG/1.5ML SOAJ Inject 225 mg into the skin every 30 (thirty) days. 08/18/21   Anson Fret, MD  gabapentin (NEURONTIN) 600 MG tablet TAKE 1 TABLET BY  MOUTH 3 TIMES DAILY 02/18/23   Lorre Munroe, NP  JUNEL FE 1/20 1-20 MG-MCG tablet Take 1 tablet by mouth daily. 11/25/22   [provider]  meclizine (ANTIVERT) 25 MG tablet Take 1 tablet (25 mg total) by mouth 3 (three) times daily as needed for dizziness. 12/20/22   Loyce Dys, MD  methocarbamol (ROBAXIN) 750 MG tablet TAKE 1 TABLET BY MOUTH EVERY 8 HOURS AS NEEDED FOR MUSCLE SPASMS 12/16/22   Lorre Munroe, NP  naratriptan (AMERGE) 2.5 MG tablet TAKE 1 TABLET BY MOUTH AT ONSET OF MIGRAINE. IF RETURNS OR DOES NOT RESOLVE, MAY REPEAT AFTER 4 HOURS. DO NOT EXCEED 2 TABS (5 MG) IN 24 HOURS. 11/09/22   Anson Fret, MD  omeprazole (PRILOSEC) 40 MG capsule TAKE 1 CAPSULE BY MOUTH DAILY 03/01/23   Lorre Munroe, NP   ondansetron (ZOFRAN-ODT) 8 MG disintegrating tablet Take 0.5 tablets (4 mg total) by mouth every 8 (eight) hours as needed for nausea or vomiting. 08/18/21   Anson Fret, MD  verapamil (CALAN-SR) 120 MG CR tablet TAKE 1 TABLET BY MOUTH AT BEDTIME 12/28/22   Lorre Munroe, NP    Family History Family History  Problem Relation Age of Onset   Diabetes Mother    Hypertension Mother    Brain cancer Father    Healthy Sister    Ovarian cancer Maternal Grandmother        metastasized   Lung cancer Maternal Grandfather    Cancer Paternal Grandmother        not sure of the type   Cancer Paternal Grandfather        not sure of the type   Migraines Neg Hx     Social History Social History   Tobacco Use   Smoking status: Never   Smokeless tobacco: Never  Vaping Use   Vaping status: Never Used  Substance Use Topics   Alcohol use: No    Alcohol/week: 0.0 standard drinks of alcohol   Drug use: No     Allergies   Lamotrigine, Abilify [aripiprazole], Azithromycin, Depakote [divalproex sodium], Erythromycin base, Lithium, Penicillins, Pregabalin, Seroquel [quetiapine fumarate], Sulfa antibiotics, Tetanus toxoids, Tramadol, and Ziprasidone hcl   Review of Systems Review of Systems Per HPI  Physical Exam Triage Vital Signs ED Triage Vitals  Encounter Vitals Group     BP 03/05/23 0852 113/72     Systolic BP Percentile --      Diastolic BP Percentile --      Pulse Rate 03/05/23 0852 (!) 102     Resp 03/05/23 0852 20     Temp 03/05/23 0852 98.2 F (36.8 C)     Temp Source 03/05/23 0852 Oral     SpO2 03/05/23 0852 98 %     Weight --      Height --      Head Circumference --      Peak Flow --      Pain Score 03/05/23 0853 6     Pain Loc --      Pain Education --      Exclude from Growth Chart --    No data found.  Updated Vital Signs BP 113/72 (BP Location: Right Arm)   Pulse (!) 102   Temp 98.2 F (36.8 C) (Oral)   Resp 20   SpO2 98%   Visual Acuity Right Eye  Distance:   Left Eye Distance:   Bilateral Distance:    Right Eye Near:  Left Eye Near:    Bilateral Near:     Physical Exam Vitals and nursing note reviewed.  Constitutional:      General: She is not in acute distress.    Appearance: Normal appearance.  HENT:     Head: Normocephalic.     Jaw: Tenderness (Right lower jaw) and pain on movement present. No swelling.     Right Ear: Tympanic membrane, ear canal and external ear normal.     Left Ear: Tympanic membrane, ear canal and external ear normal.     Nose: Nose normal.     Mouth/Throat:     Mouth: Mucous membranes are moist.     Pharynx: No posterior oropharyngeal erythema.  Eyes:     Extraocular Movements: Extraocular movements intact.     Conjunctiva/sclera: Conjunctivae normal.     Pupils: Pupils are equal, round, and reactive to light.  Cardiovascular:     Rate and Rhythm: Regular rhythm. Tachycardia present.     Pulses: Normal pulses.     Heart sounds: Normal heart sounds.  Pulmonary:     Effort: Pulmonary effort is normal. No respiratory distress.     Breath sounds: Normal breath sounds. No stridor. No wheezing, rhonchi or rales.  Abdominal:     General: Bowel sounds are normal.     Palpations: Abdomen is soft.     Tenderness: There is no abdominal tenderness.  Musculoskeletal:     Cervical back: Normal range of motion.  Lymphadenopathy:     Cervical: No cervical adenopathy.  Skin:    General: Skin is warm and dry.  Neurological:     General: No focal deficit present.     Mental Status: She is alert and oriented to person, place, and time.  Psychiatric:        Mood and Affect: Mood normal.        Behavior: Behavior normal.      UC Treatments / Results  Labs (all labs ordered are listed, but only abnormal results are displayed) Labs Reviewed - No data to display  EKG   Radiology No results found.  Procedures Procedures (including critical care time)  Medications Ordered in UC Medications -  No data to display  Initial Impression / Assessment and Plan / UC Course  I have reviewed the triage vital signs and the nursing notes.  Pertinent labs & imaging results that were available during my care of the patient were reviewed by me and considered in my medical decision making (see chart for details).  The patient is well-appearing, she is in no acute distress, vital signs are stable.  Suspect dental involvement given the patient's pain with chewing.  She has no obvious swelling, erythema, or redness noted to the right jaw.  Will treat empirically for dentalgia with 300 mg 3 times daily for the next 7 days.  For pain, ibuprofen 800 mg was prescribed.  Patient was given supportive care recommendations to include supplementing with over-the-counter Tylenol, warm salt water gargles, warm or cool compresses, and to follow-up with a dentist within the next 7 to 10 days.  Patient is in agreement with this plan of care and verbalizes understanding.  All questions were answered.  Patient stable for discharge.  Final Clinical Impressions(s) / UC Diagnoses   Final diagnoses:  Dentalgia     Discharge Instructions      Take medication as prescribed. May take over-the-counter Tylenol Extra Strength  or Arthritis Strength tablet 1 to 2 hours after ibuprofen for breakthrough  pain. Warm salt water gargles 3-4 times daily until symptoms improve. Continue warm compresses to the affected area to help with pain and discomfort. Please follow-up with your dentist within the next 7 to 10 days if symptoms fail to improve. Follow-up as needed.     ED Prescriptions     Medication Sig Dispense Auth. Provider   clindamycin (CLEOCIN) 300 MG capsule Take 1 capsule (300 mg total) by mouth 3 (three) times daily for 7 days. 21 capsule Kimla Furth-Warren, Sadie Haber, NP   ibuprofen (ADVIL) 800 MG tablet Take 1 tablet (800 mg total) by mouth 3 (three) times daily. 21 tablet Ajamu Maxon-Warren, Sadie Haber, NP       PDMP not reviewed this encounter.   Abran Cantor, NP 03/05/23 [phone]

## 2023-03-10 ENCOUNTER — Ambulatory Visit: Payer: Medicare HMO | Admitting: Adult Health

## 2023-03-10 ENCOUNTER — Encounter: Payer: Self-pay | Admitting: Adult Health

## 2023-03-10 VITALS — BP 124/71 | HR 79 | Ht 69.0 in | Wt 155.0 lb

## 2023-03-10 DIAGNOSIS — G43709 Chronic migraine without aura, not intractable, without status migrainosus: Secondary | ICD-10-CM

## 2023-03-10 MED ORDER — UBRELVY 100 MG PO TABS: ORAL_TABLET | ORAL | Status: DC

## 2023-03-10 NOTE — Progress Notes (Addendum)
PATIENT: Melissa [name] DOB: 1971-04-13  REASON FOR VISIT: follow up HISTORY FROM: patient PRIMARY NEUROLOGIST: Dr. Lucia Gaskins   Chief Complaint  Patient presents with   RM 19    Patient is here alone for yearly follow-up. She states she is doing well. She continues to be happy on Ajovy, Naratriptan, and Ondansetron. Ajovy has decreased the frequency and severity of her migraines.      HISTORY OF PRESENT ILLNESS: Today 03/10/23  Melissa [name] is a 52 y.o. female who has been followed in this office for Migraines. Returns today for follow-up. Reports having 4 migraines a month. Severity is better. Naratriptan helps- takes the edge off but can last a couple of days or hours. Its not severe and can still function. Tried Nurtec in the past but it was not helpful.   HISTORY  August 17, 2021: Patient here for follow-up of migraines, was doing great on Ajovy 6 months ago, acutely using Maxalt and baclofen or Flexeril.  She did great on Emgality but she could not afford it so we gave her Ajovy samples at last appointment, here for follow-up. She is having 5-6 migraine days a month. Tried [employer] and [employer]. A total of 5 migraine days a month and 5 total headache days a month. Her baseline migraine frequency was Lots of stress. She takes amerge and ondansetron acutely and helps a lot, may still last for hours but much better. We will try to prescribe the Ajovy and see if insurance will pay for it. At baseline she had 12 migraine days a month and > 15 headache days a month and has done tremendously well on Ajovy.    Patient complains of symptoms per HPI as well as the following symptoms: stress . Pertinent negatives and positives per HPI. All others negative     02/16/2021: DOing great on Ajovy. Only 6 migraines in 6 months. Takes maxalt and baclofen acutely or flexeril, but when she gets a migraine it is hard to get rid of, the last one was 3 days, even though frequency is greatly improved acute  management is still a problem. She did well on Emgality bu it is $85 and she can't afford it but Ajovy is better. She had covid in the meantime. We will give her 6 months of samples of Ajovy. She only had one migraine, we discussed aimovig but she is having hair loss due to covid so gave ajovy.        Acutely: Tried maxalt, zolmitriptan, ondansetron helps significantly for the nausea, sumatriptan,    HPI:  Melissa [name] is a 52 y.o. female here as requested by Corky Downs, MD for migraines. PMHx POTS, panic attack, migraine, fibromyalgia, bipolar, arthritis, HTN, IBS, depression, B12 deficiency, tremor, anxiety. She is having stress with her mother who is 10 and refuses to leave her home, so there is stress affecting. Been doing fine, restarted 3-4 months ago in the setting of stress, unilateral, mostly sound sensitivity and mild light sensitivity, movement makes it worse, nausea, no vomiting, getting a migraine every week now, can last 2-3 days, up to 12 migraine days a month for the last 3 months and pulsating/pounding, starts in the left in the back and radiates to the front, can be severe, a quiet room helps, OTC has not helped, hydrocodone has not helped significantly, had to go to the walk-in clinic recently, can affect her life and work. These are similar to prior migraines, vision is affected with the  severe migraines, no new sensory changes or new vision changes, same quality and severity just more frequent, they can happen any time of the day. Not positional or exertional or anything of concern ut is the same exact migraine as always. No other focal neurologic deficits, associated symptoms, inciting events or modifiable factors.   Reviewed notes, labs and imaging from outside physicians, which showed:   Cbc/bmp unremarkable 06/26/2019   CT head 08/2018 showed No acute intracranial abnormalities including mass lesion or mass effect, hydrocephalus, extra-axial fluid collection, midline shift,  hemorrhage, or acute infarction, large ischemic events (personally reviewed images)   Medications that patient has use that can be used in migraine management include: Tylenol, Elavil, Wellbutrin, Tegretol, Flexeril, diclofenac tablet, Benadryl, Depakote, Prozac, gabapentin, Toradol injections, meclizine, Robaxin, Reglan injections, Zofran injections and tablets, prednisone tablets, Phenergan tablets and suppositories, Maxalt, Imitrex tablets and injections, tizanidine, tramadol, Effexor, verapamil, topamax, propranolol contraindicated due to POTS.  REVIEW OF SYSTEMS: Out of a complete 14 system review of symptoms, the patient complains only of the following symptoms, and all other reviewed systems are negative.  ALLERGIES: Allergies  Allergen Reactions   Lamotrigine Hives and Swelling   Abilify [Aripiprazole] Other (See Comments)    Weight gain, did not help,    Azithromycin Rash   Depakote [Divalproex Sodium] Other (See Comments)    Hair loss   Erythromycin Base Swelling   Lithium Rash   Penicillins Rash    Has patient had a PCN reaction causing immediate rash, facial/tongue/throat swelling, SOB or lightheadedness with hypotension: Yes- swelling, rash  Has patient had a PCN reaction causing severe rash involving mucus membranes or skin necrosis: Yes Has patient had a PCN reaction that required hospitalization No Has patient had a PCN reaction occurring within the last 10 years: No If all of the above answers are "NO", then may proceed with Cephalosporin use.    Pregabalin Palpitations    Manic symptoms, no sleep x's 3 days, no appetite.   Seroquel [Quetiapine Fumarate] Other (See Comments)    Irritable   Sulfa Antibiotics Rash   Tetanus Toxoids Other (See Comments)    Swelling to injection site   Tramadol Nausea And Vomiting   Ziprasidone Hcl Other (See Comments)    Heat/cold intolerance.     HOME MEDICATIONS: Outpatient Medications Prior to Visit  Medication Sig Dispense  Refill   acetaminophen (TYLENOL) 500 MG tablet Take 1,000 mg by mouth daily as needed for mild pain.     buPROPion (WELLBUTRIN XL) 150 MG 24 hr tablet Take 1 tablet (150 mg total) by mouth daily. 90 tablet 3   clindamycin (CLEOCIN) 300 MG capsule Take 1 capsule (300 mg total) by mouth 3 (three) times daily for 7 days. 21 capsule 0   clonazePAM (KLONOPIN) 1 MG tablet Take 1 tablet (1 mg total) by mouth 2 (two) times daily as needed. for anxiety 60 tablet 0   etodolac (LODINE) 400 MG tablet Take 1 tablet (400 mg total) by mouth daily as needed. 30 tablet 0   fexofenadine (ALLEGRA) 180 MG tablet Take 180 mg by mouth daily.     fluvoxaMINE (LUVOX) 50 MG tablet Take 50-100 mg by mouth at bedtime.      Fremanezumab-vfrm (AJOVY) 225 MG/1.5ML SOAJ Inject 225 mg into the skin every 30 (thirty) days. 1.5 mL 11   gabapentin (NEURONTIN) 600 MG tablet TAKE 1 TABLET BY MOUTH 3 TIMES DAILY 270 tablet 1   ibuprofen (ADVIL) 800 MG tablet Take 1 tablet (800  mg total) by mouth 3 (three) times daily. 21 tablet 0   JUNEL FE 1/20 1-20 MG-MCG tablet Take 1 tablet by mouth daily.     meclizine (ANTIVERT) 25 MG tablet Take 1 tablet (25 mg total) by mouth 3 (three) times daily as needed for dizziness. 30 tablet 0   methocarbamol (ROBAXIN) 750 MG tablet TAKE 1 TABLET BY MOUTH EVERY 8 HOURS AS NEEDED FOR MUSCLE SPASMS 270 tablet 0   naratriptan (AMERGE) 2.5 MG tablet TAKE 1 TABLET BY MOUTH AT ONSET OF MIGRAINE. IF RETURNS OR DOES NOT RESOLVE, MAY REPEAT AFTER 4 HOURS. DO NOT EXCEED 2 TABS (5 MG) IN 24 HOURS. 9 tablet 3   omeprazole (PRILOSEC) 40 MG capsule TAKE 1 CAPSULE BY MOUTH DAILY 90 capsule 0   ondansetron (ZOFRAN-ODT) 8 MG disintegrating tablet Take 0.5 tablets (4 mg total) by mouth every 8 (eight) hours as needed for nausea or vomiting. 30 tablet 11   verapamil (CALAN-SR) 120 MG CR tablet TAKE 1 TABLET BY MOUTH AT BEDTIME 90 tablet 0   No facility-administered medications prior to visit.    PAST MEDICAL  HISTORY: Past Medical History:  Diagnosis Date   Arthritis    Bipolar disorder (HCC)    Fibromyalgia    on disability   Migraine    Muscle spasm    Pain of right side of body    chronic   Panic attack    POTS (postural orthostatic tachycardia syndrome)    Syncope    recurrent   Uterine perforation    hx of    PAST SURGICAL HISTORY: Past Surgical History:  Procedure Laterality Date   CATARACT EXTRACTION  dec 2014   DILATION AND CURETTAGE OF UTERUS     LASIK  dec 2014    FAMILY HISTORY: Family History  Problem Relation Age of Onset   Stroke Mother    Diabetes Mother    Hypertension Mother    Brain cancer Father    Healthy Sister    Ovarian cancer Maternal Grandmother        metastasized   Lung cancer Maternal Grandfather    Cancer Paternal Grandmother        not sure of the type   Cancer Paternal Grandfather        not sure of the type   Migraines Neg Hx     SOCIAL HISTORY: Social History   Socioeconomic History   Marital status: Married    Spouse name: [name]   Number of children: 0   Years of education: some college   Highest education level: Not on file  Occupational History    Employer: OTHER    Comment: disabled  Tobacco Use   Smoking status: Never   Smokeless tobacco: Never  Vaping Use   Vaping status: Never Used  Substance and Sexual Activity   Alcohol use: No    Alcohol/week: 0.0 standard drinks of alcohol   Drug use: No   Sexual activity: Yes    Birth control/protection: Other-see comments    Comment: also on OCPs, husband had vasectomy  Other Topics Concern   Not on file  Social History Narrative   From: [place], moved because her husband was from [place]   Living: with husband [name], since 1990   Work: disability due to fibromyalgia and bipolar disorder   Works part time 2 days per week in [place]       Family:has a sister who lives in [place]      Enjoys: crochet,  baking, reading      Exercise: walking 19,000 steps 2 days per week,  yoga - tries to do 3 times a week   Diet: not great, more sweets than she should      Safety   Seat belts: Yes    Guns: Yes  and secure   Safe in relationships: Yes          Right handed   Caffeine: none    Social Determinants of Health   Financial Resource Strain: Not on file  Food Insecurity: No Food Insecurity (12/19/2022)   Hunger Vital Sign    Worried About Running Out of Food in the Last Year: Never true    Ran Out of Food in the Last Year: Never true  Transportation Needs: No Transportation Needs (12/19/2022)   PRAPARE - Administrator, Civil Service (Medical): No    Lack of Transportation (Non-Medical): No  Physical Activity: Not on file  Stress: Not on file  Social Connections: Unknown (02/01/2023)   Received from [facility]   Social Network    Social Network: Not on file  Intimate Partner Violence: Unknown (02/01/2023)   Received from [facility]   HITS    Physically Hurt: Not on file    Insult or Talk Down To: Not on file    Threaten Physical Harm: Not on file    Scream or Curse: Not on file      PHYSICAL EXAM  Vitals:   03/10/23 1347  BP: 124/71  Pulse: 79  Weight: 155 lb (70.3 kg)  Height: 5\' 9"  (1.753 m)   Body mass index is 22.89 kg/m.  Generalized: Well developed, in no acute distress   Neurological examination  Mentation: Alert oriented to time, place, history taking. Follows all commands speech and language fluent Cranial nerve II-XII: Pupils were equal round reactive to light. Extraocular movements were full, visual field were full on confrontational test. Facial sensation and strength were normal.  Motor: The motor testing reveals 5 over 5 strength of all 4 extremities. Good symmetric motor tone is noted throughout.  Sensory: Sensory testing is intact to soft touch on all 4 extremities. No evidence of extinction is noted.  Coordination: Cerebellar testing reveals good finger-nose-finger and heel-to-shin bilaterally.  Gait and  station: Gait is normal.   DIAGNOSTIC DATA (LABS, IMAGING, TESTING) - I reviewed patient records, labs, notes, testing and imaging myself where available.  Lab Results  Component Value Date   WBC 8.1 12/20/2022   HGB 12.4 12/20/2022   HCT 37.9 12/20/2022   MCV 87.7 12/20/2022   PLT 266 12/20/2022      Component Value Date/Time   NA 140 12/20/2022 0456   NA 141 06/17/2014 1511   K 3.5 12/20/2022 0456   K 4.0 06/17/2014 1511   CL 111 12/20/2022 0456   CL 109 (H) 06/17/2014 1511   CO2 22 12/20/2022 0456   CO2 23 06/17/2014 1511   GLUCOSE 84 12/20/2022 0456   GLUCOSE 88 06/17/2014 1511   BUN 8 12/20/2022 0456   BUN 11 06/17/2014 1511   CREATININE 0.97 12/20/2022 0456   CREATININE 1.15 (H) 12/10/2022 0821   CALCIUM 7.9 (L) 12/20/2022 0456   CALCIUM 8.4 (L) 06/17/2014 1511   PROT 6.8 12/19/2022 0846   PROT 7.5 06/17/2014 1511   ALBUMIN 3.7 12/19/2022 0846   ALBUMIN 4.0 06/17/2014 1511   AST 21 12/19/2022 0846   AST 11 (L) 06/17/2014 1511   ALT 15 12/19/2022 0846  ALT 16 06/17/2014 1511   ALKPHOS 58 12/19/2022 0846   ALKPHOS 74 06/17/2014 1511   BILITOT 0.5 12/19/2022 0846   BILITOT 0.2 06/17/2014 1511   GFRNONAA >60 12/20/2022 0456   GFRNONAA 78 04/28/2020 0905   GFRAA 90 04/28/2020 0905   Lab Results  Component Value Date   CHOL 312 (H) 12/19/2022   HDL 56 12/19/2022   LDLCALC 188 (H) 12/19/2022   LDLDIRECT 144.0 06/24/2016   TRIG 341 (H) 12/19/2022   CHOLHDL 5.6 12/19/2022   Lab Results  Component Value Date   HGBA1C 5.4 12/19/2022   Lab Results  Component Value Date   VITAMINB12 452 12/10/2022   Lab Results  Component Value Date   TSH 1.04 12/10/2022      ASSESSMENT AND PLAN 52 y.o. year old female  has a past medical history of Arthritis, Bipolar disorder (HCC), Fibromyalgia, Migraine, Muscle spasm, Pain of right side of body, Panic attack, POTS (postural orthostatic tachycardia syndrome), Syncope, and Uterine perforation. here with:  Migraines    - Continue Ajovy monthly injection - Try Ubrevly 100 mg take at the onset of migraine- repeat in 2 hours if needed. Sample given today. - Ok to continue naratriptan but advised not to take with Bernita Raisin.  - FU in 1 year or sooner if needed.    Butch Penny, MSN, NP-C 03/10/2023, 2:10 PM [facility] [address] [place], [place] [address] [phone]

## 2023-03-10 NOTE — Patient Instructions (Addendum)
Your Plan:  Continue Ajovy Try Bernita Raisin. Take 1 tablet at the onset of migraine. Can repeat in 2 hours if needed.  Can continue Naratriptan but Don't take Ubrelvy with naratriptan  If your symptoms worsen or you develop new symptoms please let us know.    Thank you for coming to see [place] at [facility]. I hope we have been able to provide you high quality care today.  You may receive a patient satisfaction survey over the next few weeks. We would appreciate your feedback and comments so that we may continue to improve ourselves and the health of our patients.

## 2023-03-12 ENCOUNTER — Other Ambulatory Visit: Payer: Self-pay

## 2023-03-12 ENCOUNTER — Emergency Department
Admission: EM | Admit: 2023-03-12 | Discharge: 2023-03-13 | Disposition: A | Discharge status: Home / Self Care | Payer: Medicare HMO | Attending: Emergency Medicine | Admitting: Emergency Medicine

## 2023-03-12 ENCOUNTER — Emergency Department: Payer: Medicare HMO

## 2023-03-12 DIAGNOSIS — R519 Headache, unspecified: Secondary | ICD-10-CM | POA: Diagnosis not present

## 2023-03-12 DIAGNOSIS — R0789 Other chest pain: Secondary | ICD-10-CM | POA: Insufficient documentation

## 2023-03-12 DIAGNOSIS — M79601 Pain in right arm: Secondary | ICD-10-CM | POA: Insufficient documentation

## 2023-03-12 DIAGNOSIS — I10 Essential (primary) hypertension: Secondary | ICD-10-CM | POA: Insufficient documentation

## 2023-03-12 DIAGNOSIS — R42 Dizziness and giddiness: Secondary | ICD-10-CM | POA: Insufficient documentation

## 2023-03-12 DIAGNOSIS — R531 Weakness: Secondary | ICD-10-CM | POA: Insufficient documentation

## 2023-03-12 DIAGNOSIS — R5383 Other fatigue: Secondary | ICD-10-CM | POA: Insufficient documentation

## 2023-03-12 DIAGNOSIS — Z20822 Contact with and (suspected) exposure to covid-19: Secondary | ICD-10-CM | POA: Insufficient documentation

## 2023-03-12 DIAGNOSIS — G43909 Migraine, unspecified, not intractable, without status migrainosus: Secondary | ICD-10-CM | POA: Diagnosis not present

## 2023-03-12 LAB — CBC
HCT: 38.3 % (ref 36.0–46.0)
Hemoglobin: 12.6 g/dL (ref 12.0–15.0)
MCH: 28.8 pg (ref 26.0–34.0)
MCHC: 32.9 g/dL (ref 30.0–36.0)
MCV: 87.6 fL (ref 80.0–100.0)
Platelets: 283 10*3/uL (ref 150–400)
RBC: 4.37 MIL/uL (ref 3.87–5.11)
RDW: 13.2 % (ref 11.5–15.5)
WBC: 9 10*3/uL (ref 4.0–10.5)
nRBC: 0 % (ref 0.0–0.2)

## 2023-03-12 LAB — BASIC METABOLIC PANEL
Anion gap: 9 (ref 5–15)
BUN: 13 mg/dL (ref 6–20)
CO2: 24 mmol/L (ref 22–32)
Calcium: 8.4 mg/dL — ABNORMAL LOW (ref 8.9–10.3)
Chloride: 108 mmol/L (ref 98–111)
Creatinine, Ser: 1.04 mg/dL — ABNORMAL HIGH (ref 0.44–1.00)
GFR, Estimated: >60 mL/min (ref >60)
Glucose, Bld: 98 mg/dL (ref 70–99)
Potassium: 3.9 mmol/L (ref 3.5–5.1)
Sodium: 141 mmol/L (ref 135–145)

## 2023-03-12 LAB — TROPONIN I (HIGH SENSITIVITY): Troponin I (High Sensitivity): <2 ng/L (ref <18)

## 2023-03-12 NOTE — ED Triage Notes (Signed)
Pt to ED c/o "chest pressure" x 3 days; intermittent in nature. Pt reports "not feeling well" x months, low energy, evaluated for this at PCP with no explanation. No s/s of acute distress noted in triage.

## 2023-03-13 ENCOUNTER — Emergency Department: Payer: Medicare HMO

## 2023-03-13 DIAGNOSIS — R519 Headache, unspecified: Secondary | ICD-10-CM | POA: Diagnosis not present

## 2023-03-13 LAB — URINALYSIS, ROUTINE W REFLEX MICROSCOPIC
Bilirubin Urine: NEGATIVE
Glucose, UA: NEGATIVE mg/dL
Hgb urine dipstick: NEGATIVE
Ketones, ur: NEGATIVE mg/dL
Leukocytes,Ua: NEGATIVE
Nitrite: NEGATIVE
Protein, ur: NEGATIVE mg/dL
Specific Gravity, Urine: 1.02 (ref 1.005–1.030)
pH: 5 (ref 5.0–8.0)

## 2023-03-13 LAB — HEPATIC FUNCTION PANEL
ALT: 12 U/L (ref 0–44)
AST: 16 U/L (ref 15–41)
Albumin: 3.5 g/dL (ref 3.5–5.0)
Alkaline Phosphatase: 41 U/L (ref 38–126)
Bilirubin, Direct: <0.1 mg/dL (ref 0.0–0.2)
Total Bilirubin: 0.6 mg/dL (ref 0.3–1.2)
Total Protein: 6.4 g/dL — ABNORMAL LOW (ref 6.5–8.1)

## 2023-03-13 LAB — RESP PANEL BY RT-PCR (RSV, FLU A&B, COVID)  RVPGX2
Influenza A by PCR: NEGATIVE
Influenza B by PCR: NEGATIVE
Resp Syncytial Virus by PCR: NEGATIVE
SARS Coronavirus 2 by RT PCR: NEGATIVE

## 2023-03-13 LAB — C-REACTIVE PROTEIN: CRP: <0.5 mg/dL (ref <1.0)

## 2023-03-13 LAB — T4, FREE: Free T4: 0.75 ng/dL (ref 0.61–1.12)

## 2023-03-13 LAB — SEDIMENTATION RATE: Sed Rate: 9 mm/hr (ref 0–30)

## 2023-03-13 LAB — TROPONIN I (HIGH SENSITIVITY): Troponin I (High Sensitivity): <2 ng/L (ref <18)

## 2023-03-13 LAB — LIPASE, BLOOD: Lipase: 69 U/L — ABNORMAL HIGH (ref 11–51)

## 2023-03-13 LAB — TSH: TSH: 3.799 u[IU]/mL (ref 0.350–4.500)

## 2023-03-13 MED ORDER — DIAZEPAM 5 MG/ML IJ SOLN
2.0000 mg | Freq: Once | INTRAMUSCULAR | Status: AC
  Administered 2023-03-13: 2 mg via INTRAVENOUS
  Filled 2023-03-13: qty 2

## 2023-03-13 MED ORDER — SODIUM CHLORIDE 0.9 % IV BOLUS
1000.0000 mL | Freq: Once | INTRAVENOUS | Status: AC
  Administered 2023-03-13: 1000 mL via INTRAVENOUS

## 2023-03-13 MED ORDER — ONDANSETRON HCL 4 MG/2ML IJ SOLN
4.0000 mg | Freq: Once | INTRAMUSCULAR | Status: AC
  Administered 2023-03-13: 4 mg via INTRAVENOUS
  Filled 2023-03-13: qty 2

## 2023-03-13 MED ORDER — KETOROLAC TROMETHAMINE 30 MG/ML IJ SOLN
15.0000 mg | Freq: Once | INTRAMUSCULAR | Status: AC
  Administered 2023-03-13: 15 mg via INTRAVENOUS
  Filled 2023-03-13: qty 1

## 2023-03-13 NOTE — ED Provider Notes (Signed)
[facility] Provider Note    Event Date/Time   First MD Initiated Contact with Patient 03/13/23 0000     (approximate)   History   Chest Pain and Fatigue   HPI  Melissa [name] is a 52 y.o. female who presents to the ED from home with multiple medical complaints of 6+ months duration: Generalized weakness and fatigue, low energy, hair falling out, occasional chest and right arm pain, migraine headaches.  Patient does see a neurologist for migraine headaches and was placed on a new medication last week; states she has not had a migraine since Thursday.  Reports a 3-day history of feeling dizzy/lightheaded with generalized chest pressure which is intermittent in nature.  Denies associated fever/chills, cough, abdominal pain, nausea or vomiting.  States her PCP has done a slew of lab work and cannot figure out what is wrong with her.  She was dismissed by cardiology which she was seeing for POTS approximately 1 year ago because she was doing so well.  No history of tick bite.     Past Medical History   Past Medical History:  Diagnosis Date   Arthritis    Bipolar disorder (HCC)    Fibromyalgia    on disability   Migraine    Muscle spasm    Pain of right side of body    chronic   Panic attack    POTS (postural orthostatic tachycardia syndrome)    Syncope    recurrent   Uterine perforation    hx of     Active Problem List   Patient Active Problem List   Diagnosis Date Noted   Chronic migraine without aura without status migrainosus, not intractable 02/11/2020   Anxiety 10/23/2019   Essential hypertension 05/09/2017   IBS (irritable bowel syndrome) 09/20/2013   Dyslipidemia 04/03/2013   Osteopenia 04/03/2013   POTS (postural orthostatic tachycardia syndrome) 02/26/2013   Fibromyalgia    Bipolar disorder (HCC)    Gastroesophageal reflux disease without esophagitis 09/10/2011     Past Surgical History   Past Surgical History:  Procedure  Laterality Date   CATARACT EXTRACTION  dec 2014   DILATION AND CURETTAGE OF UTERUS     LASIK  dec 2014     Home Medications   Prior to Admission medications   Medication Sig Start Date End Date Taking? Authorizing Provider  acetaminophen (TYLENOL) 500 MG tablet Take 1,000 mg by mouth daily as needed for mild pain.    [provider]  buPROPion (WELLBUTRIN XL) 150 MG 24 hr tablet Take 1 tablet (150 mg total) by mouth daily. 07/17/19   Dianne Dun, MD  clonazePAM (KLONOPIN) 1 MG tablet Take 1 tablet (1 mg total) by mouth 2 (two) times daily as needed. for anxiety 05/26/16   Dianne Dun, MD  etodolac (LODINE) 400 MG tablet Take 1 tablet (400 mg total) by mouth daily as needed. 12/30/22   Lorre Munroe, NP  fexofenadine (ALLEGRA) 180 MG tablet Take 180 mg by mouth daily.    [provider]  fluvoxaMINE (LUVOX) 50 MG tablet Take 50-100 mg by mouth at bedtime.  05/23/17   [provider]  Fremanezumab-vfrm (AJOVY) 225 MG/1.5ML SOAJ Inject 225 mg into the skin every 30 (thirty) days. 08/18/21   Anson Fret, MD  gabapentin (NEURONTIN) 600 MG tablet TAKE 1 TABLET BY MOUTH 3 TIMES DAILY 02/18/23   Lorre Munroe, NP  ibuprofen (ADVIL) 800 MG tablet Take 1 tablet (800  mg total) by mouth 3 (three) times daily. 03/05/23   Leath-Warren, Sadie Haber, NP  JUNEL FE 1/20 1-20 MG-MCG tablet Take 1 tablet by mouth daily. 11/25/22   [provider]  meclizine (ANTIVERT) 25 MG tablet Take 1 tablet (25 mg total) by mouth 3 (three) times daily as needed for dizziness. 12/20/22   Loyce Dys, MD  methocarbamol (ROBAXIN) 750 MG tablet TAKE 1 TABLET BY MOUTH EVERY 8 HOURS AS NEEDED FOR MUSCLE SPASMS 12/16/22   Lorre Munroe, NP  naratriptan (AMERGE) 2.5 MG tablet TAKE 1 TABLET BY MOUTH AT ONSET OF MIGRAINE. IF RETURNS OR DOES NOT RESOLVE, MAY REPEAT AFTER 4 HOURS. DO NOT EXCEED 2 TABS (5 MG) IN 24 HOURS. 11/09/22   Anson Fret, MD  omeprazole (PRILOSEC) 40 MG capsule TAKE 1  CAPSULE BY MOUTH DAILY 03/01/23   Lorre Munroe, NP  ondansetron (ZOFRAN-ODT) 8 MG disintegrating tablet Take 0.5 tablets (4 mg total) by mouth every 8 (eight) hours as needed for nausea or vomiting. 08/18/21   Anson Fret, MD  Ubrogepant (UBRELVY) 100 MG TABS Take 1 tablet (100 mg) by mouth at onset of migraine. May take an additional tablet in 2 hours if needed. Max 200 mg per day 03/10/23   Butch Penny, NP  verapamil (CALAN-SR) 120 MG CR tablet TAKE 1 TABLET BY MOUTH AT BEDTIME 12/28/22   Lorre Munroe, NP     Allergies  Lamotrigine, Abilify [aripiprazole], Azithromycin, Depakote [divalproex sodium], Erythromycin base, Lithium, Penicillins, Pregabalin, Seroquel [quetiapine fumarate], Sulfa antibiotics, Tetanus toxoids, Tramadol, and Ziprasidone hcl   Family History   Family History  Problem Relation Age of Onset   Stroke Mother    Diabetes Mother    Hypertension Mother    Brain cancer Father    Healthy Sister    Ovarian cancer Maternal Grandmother        metastasized   Lung cancer Maternal Grandfather    Cancer Paternal Grandmother        not sure of the type   Cancer Paternal Grandfather        not sure of the type   Migraines Neg Hx      Physical Exam  Triage Vital Signs: ED Triage Vitals  Encounter Vitals Group     BP 03/12/23 2149 131/81     Systolic BP Percentile --      Diastolic BP Percentile --      Pulse Rate 03/12/23 2149 93     Resp 03/12/23 2149 14     Temp 03/12/23 2149 98.4 F (36.9 C)     Temp Source 03/12/23 2149 Oral     SpO2 03/12/23 2149 100 %     Weight 03/12/23 2149 155 lb (70.3 kg)     Height 03/12/23 2149 5\' 9"  (1.753 m)     Head Circumference --      Peak Flow --      Pain Score 03/12/23 2149 8     Pain Loc --      Pain Education --      Exclude from Growth Chart --     Updated Vital Signs: BP (!) 110/58   Pulse 78   Temp 98.4 F (36.9 C) (Oral)   Resp 17   Ht 5\' 9"  (1.753 m)   Wt 70.3 kg   SpO2 100%   BMI 22.89 kg/m     General: Awake, no distress.  Mildly dry mucous membranes. CV:  RRR.  Good peripheral  perfusion.  Resp:  Normal effort.  CTAB. Abd:  Nontender.  No distention.  Other:  No thyromegaly.  Alert and oriented x 3.  CN II-XII roughly intact.  5/5 motor strength and sensation all extremities.  M AE x 4.   ED Results / Procedures / Treatments  Labs (all labs ordered are listed, but only abnormal results are displayed) Labs Reviewed  BASIC METABOLIC PANEL - Abnormal; Notable for the following components:      Result Value   Creatinine, Ser 1.04 (*)    Calcium 8.4 (*)    All other components within normal limits  HEPATIC FUNCTION PANEL - Abnormal; Notable for the following components:   Total Protein 6.4 (*)    All other components within normal limits  LIPASE, BLOOD - Abnormal; Notable for the following components:   Lipase 69 (*)    All other components within normal limits  URINALYSIS, ROUTINE W REFLEX MICROSCOPIC - Abnormal; Notable for the following components:   Color, Urine YELLOW (*)    APPearance HAZY (*)    All other components within normal limits  RESP PANEL BY RT-PCR (RSV, FLU A&B, COVID)  [id number]  CBC  SEDIMENTATION RATE  TSH  T4, FREE  C-REACTIVE PROTEIN  TROPONIN I (HIGH SENSITIVITY)  TROPONIN I (HIGH SENSITIVITY)     EKG  ED ECG REPORT I, Jelani Vreeland J, the attending physician, personally viewed and interpreted this ECG.   Date: 03/13/2023  EKG Time: 2151  Rate: 100  Rhythm: normal sinus rhythm  Axis: RAD  Intervals:none  ST&T Change: Nonspecific    RADIOLOGY I have independently visualized and interpreted patient's imaging studies as well as noted the radiology interpretation:  CT head: No ICH  Chest x-ray: No acute cardiopulmonary process  Official radiology report(s): CT Head Wo Contrast  Result Date: 03/13/2023 CLINICAL DATA:  Headache, increasing frequency or severity EXAM: CT HEAD WITHOUT CONTRAST TECHNIQUE: Contiguous axial images were  obtained from the base of the skull through the vertex without intravenous contrast. RADIATION DOSE REDUCTION: This exam was performed according to the departmental dose-optimization program which includes automated exposure control, adjustment of the mA and/or kV according to patient size and/or use of iterative reconstruction technique. COMPARISON:  12/19/2022 FINDINGS: Brain: No evidence of acute infarction, hemorrhage, mass, mass effect, or midline shift. No hydrocephalus or extra-axial fluid collection. Vascular: No hyperdense vessel. Skull: Negative for fracture or focal lesion. Hyperostosis frontalis. Sinuses/Orbits: No acute finding. Other: The mastoid air cells are well aerated. IMPRESSION: No acute intracranial process. Electronically Signed   By: Wiliam Ke M.D.   On: 03/13/2023 01:12   DG Chest Portable 1 View  Result Date: 03/13/2023 CLINICAL DATA:  Chest pressure x3 days. EXAM: PORTABLE CHEST 1 VIEW COMPARISON:  December 19, 2022 FINDINGS: The heart size and mediastinal contours are within normal limits. Very mild right apical scarring and/or atelectasis is seen. There is no evidence of an acute infiltrate, pleural effusion or pneumothorax. The visualized skeletal structures are unremarkable. IMPRESSION: No active disease. Electronically Signed   By: Aram Candela M.D.   On: 03/13/2023 00:08     PROCEDURES:  Critical Care performed: No  .1-3 Lead EKG Interpretation  Performed by: Irean Hong, MD Authorized by: Irean Hong, MD     Interpretation: normal     ECG rate:  98   ECG rate assessment: normal     Rhythm: sinus rhythm     Ectopy: none     Conduction: normal   Comments:  Patient placed on cardiac monitor to evaluate for arrhythmias    MEDICATIONS ORDERED IN ED: Medications  sodium chloride 0.9 % bolus 1,000 mL (0 mLs Intravenous Stopped 03/13/23 0127)  diazepam (VALIUM) injection 2 mg (2 mg Intravenous Given 03/13/23 0024)  ondansetron (ZOFRAN) injection 4 mg (4  mg Intravenous Given 03/13/23 0024)  ketorolac (TORADOL) 30 MG/ML injection 15 mg (15 mg Intravenous Given 03/13/23 0024)     IMPRESSION / MDM / ASSESSMENT AND PLAN / ED COURSE  I reviewed the triage vital signs and the nursing notes.                             52 year old female presenting with chronic medical complaints of 6 months duration.  Diagnosis includes but is not limited to infectious, metabolic, endocrinology, rheumatologic etiologies, etc.  I have personally reviewed patient's records and note her neurology office visit from 03/10/2023.  Patient's presentation is most consistent with exacerbation of chronic illness.  The patient is on the cardiac monitor to evaluate for evidence of arrhythmia and/or significant heart rate changes.  Laboratory results demonstrate normal WBC, unremarkable electrolytes, negative troponin and chest x-ray.  Will check thyroid panel, inflammatory markers, UA, CT head, respiratory panel.  I have set realistic expectations with the patient and her spouse that the emergency department will not be making a diagnosis for her today but hopefully will symptomatically get her feeling better and will refer her out to appropriate specialist.  Clinical Course as of 03/13/23 0316  [facility] Mar 13, 2023  0315 Patient feeling significantly better after IV fluids.  Has meclizine and Zofran for home use.  Updated her of additional negative test results.  She will follow-up with her established neurologist and cardiologist.  I will refer patient to endocrinology for follow-up.  Strict return precautions given.  Patient verbalizes understanding and agrees with plan of care. [JS]    Clinical Course User Index [JS] Irean Hong, MD     FINAL CLINICAL IMPRESSION(S) / ED DIAGNOSES   Final diagnoses:  Other fatigue  Dizzy     Rx / DC Orders   ED Discharge Orders     None        Note:  This document was prepared using Dragon voice recognition software and may  include unintentional dictation errors.   Irean Hong, MD 03/13/23 [phone]

## 2023-03-13 NOTE — ED Notes (Signed)
ED Provider at bedside. 

## 2023-03-13 NOTE — Discharge Instructions (Signed)
Please follow-up with your neurologist and cardiologist.  You may call the number provided to establish care with an endocrinologist to further investigate your symptoms and thinning hair.  Return to the ER for worsening symptoms, persistent vomiting, difficulty breathing or other concerns.

## 2023-03-13 NOTE — ED Notes (Signed)
Provided pt with discharge instructions and education. All of pt questions answered. Pt in possession of all belongings. Pt AAOX4 and stable at time of discharge.Pt ambulated w/ steady gait towards ED exit. Pt accompanied by family member.

## 2023-03-14 ENCOUNTER — Ambulatory Visit: Payer: Self-pay

## 2023-03-14 NOTE — Telephone Encounter (Signed)
Apt scheduled for 09/24

## 2023-03-14 NOTE — Telephone Encounter (Signed)
Summary: Seeking appt, symptomatic, nothing available today   Pt called requesting to be seen today for an appt. Her symptoms include that she has not slept since Saturday morning, says she is in pain all over, and feels terrible overall.     Chief Complaint: Body aches all over, not sleeping. Went to ED over the weekend.Appointment tomorrow, but asking to be worked in today. Symptoms: Above Frequency: This weekend Pertinent Negatives: Patient denies fever Disposition: [] ED /[] Urgent Care (no appt availability in office) / [x] Appointment(In office/virtual)/ []  [place] [facility]/ [] Home Care/ [] Refused Recommended Disposition /[] [place] [facility]/ []  Follow-up with PCP Additional Notes: Please advise pt.  Reason for Disposition  [1] SEVERE pain (e.g., excruciating, unable to do any normal activities) AND [2] not improved 2 hours after pain medicine  Answer Assessment - Initial Assessment Questions 1. ONSET: "When did the muscle aches or body pains start?"      This weekend 2. LOCATION: "What part of your body is hurting?" (e.g., entire body, arms, legs)      All over 3. SEVERITY: "How bad is the pain?" (Scale 1-10; or mild, moderate, severe)   - MILD (1-3): doesn't interfere with normal activities    - MODERATE (4-7): interferes with normal activities or awakens from sleep    - SEVERE (8-10):  excruciating pain, unable to do any normal activities      Moderate 4. CAUSE: "What do you think is causing the pains?"     Unsure 5. FEVER: "Have you been having fever?"     No 6. OTHER SYMPTOMS: "Do you have any other symptoms?" (e.g., chest pain, weakness, rash, cold or flu symptoms, weight loss)     Not sleeping 7. PREGNANCY: "Is there any chance you are pregnant?" "When was your last menstrual period?"     No 8. TRAVEL: "Have you traveled out of the country in the last month?" (e.g., travel history, exposures)     No  Protocols used: Muscle Aches and Body Pain-A-AH

## 2023-03-15 ENCOUNTER — Ambulatory Visit (INDEPENDENT_AMBULATORY_CARE_PROVIDER_SITE_OTHER): Payer: Medicare HMO | Admitting: Internal Medicine

## 2023-03-15 ENCOUNTER — Encounter: Payer: Self-pay | Admitting: Internal Medicine

## 2023-03-15 VITALS — BP 108/72 | HR 91 | Temp 96.4°F | Wt 164.0 lb

## 2023-03-15 DIAGNOSIS — K0889 Other specified disorders of teeth and supporting structures: Secondary | ICD-10-CM

## 2023-03-15 DIAGNOSIS — M797 Fibromyalgia: Secondary | ICD-10-CM | POA: Diagnosis not present

## 2023-03-15 DIAGNOSIS — G90A Postural orthostatic tachycardia syndrome (POTS): Secondary | ICD-10-CM | POA: Diagnosis not present

## 2023-03-15 DIAGNOSIS — M79604 Pain in right leg: Secondary | ICD-10-CM

## 2023-03-15 DIAGNOSIS — G43709 Chronic migraine without aura, not intractable, without status migrainosus: Secondary | ICD-10-CM

## 2023-03-15 DIAGNOSIS — M79601 Pain in right arm: Secondary | ICD-10-CM | POA: Diagnosis not present

## 2023-03-15 DIAGNOSIS — T148XXA Other injury of unspecified body region, initial encounter: Secondary | ICD-10-CM | POA: Diagnosis not present

## 2023-03-15 MED ORDER — TRAMADOL HCL 50 MG PO TABS: 50.0000 mg | ORAL_TABLET | Freq: Three times a day (TID) | ORAL | 0 refills | Status: AC | PRN

## 2023-03-15 MED ORDER — MIRTAZAPINE 15 MG PO TABS
15.0000 mg | ORAL_TABLET | Freq: Every day | ORAL | 0 refills | Status: DC
Start: 2023-03-15 — End: 2023-10-03

## 2023-03-15 NOTE — Progress Notes (Signed)
Subjective:    Patient ID: Melissa [name], female    DOB: [birth year], 52 y.o.   MRN: [id number]  HPI  Patient presents to clinic today for multiple urgent care and ER follow-ups.  She presented to urgent care 9/14 with dental pain.  She was treated for a possible abscess with clindamycin and ibuprofen.  She subsequently presented to the ER 9/21 with multiple complaints of fatigue, dizziness, migraines, chest tightness, hair falling out and generalized weakness.  Lab work revealed a slightly elevated creatinine and lipase but otherwise unremarkable.  ECG was normal.  Chest x-ray was normal. Head CT was normal.  She was treated with IV fluids.  She reports she already had meclizine and zofran at home.  She was advised to follow-up with her neurologist and cardiologist as an outpatient.  She was also referred to endocrinology for further evaluation. Since discharge, she reports she is having trouble sleeping. She is having difficulty falling and staying asleep. She attributes this to pain in her right arm and right leg pain. She describes the pain as a deep "bone pain", sharp pain that is constantly there. She denies numbness or tingling in her right upper or lower extremity. She does have chronic neck pain that she describes as sore but has not been worse lately.  MRI of the cervical spine from 2022 showed:   IMPRESSION: Comparison is made to the prior cervical spine MRI of 11/29/2013. Progressive C5-C6 spondylosis as described. At this level, a new right center/foraminal disc protrusion with associated osteophyte ridge contributes to moderate/severe right neural foraminal narrowing. Correlate for right C6 radiculopathy. Multifactorial mild spinal canal and left neural foraminal narrowing is also new from the prior exam. At C6-C7, there is a shallow disc bulge with progressive mild right-sided disc osteophyte ridge/uncinate hypertrophy. However, no significant spinal canal or foraminal stenosis at this  level. No significant spinal canal or foraminal stenosis at the remaining levels.  MRI of the lumbar spine from 2022 showed: IMPRESSION: 1. No acute or traumatic finding. 2. L3-4: Mild disc bulge, focally prominent in the left foraminal to extraforaminal region. The left L3 nerve is adjacent to the disc bulge but does not appear compressed. 3. L4-5: Mild disc bulge. Mild facet and ligamentous prominence. No compressive stenosis.  She also reports bruising of her lower extremities. She is not sure how she got these bruises. She has been taking ibuprofen, tylenol OTC  and methocarbamol with minimal relief of symptoms.   Review of Systems     Past Medical History:  Diagnosis Date   Arthritis    Bipolar disorder (HCC)    Fibromyalgia    on disability   Migraine    Muscle spasm    Pain of right side of body    chronic   Panic attack    POTS (postural orthostatic tachycardia syndrome)    Syncope    recurrent   Uterine perforation    hx of    Current Outpatient Medications  Medication Sig Dispense Refill   acetaminophen (TYLENOL) 500 MG tablet Take 1,000 mg by mouth daily as needed for mild pain.     buPROPion (WELLBUTRIN XL) 150 MG 24 hr tablet Take 1 tablet (150 mg total) by mouth daily. 90 tablet 3   clonazePAM (KLONOPIN) 1 MG tablet Take 1 tablet (1 mg total) by mouth 2 (two) times daily as needed. for anxiety 60 tablet 0   etodolac (LODINE) 400 MG tablet Take 1 tablet (400 mg total) by mouth  daily as needed. 30 tablet 0   fexofenadine (ALLEGRA) 180 MG tablet Take 180 mg by mouth daily.     fluvoxaMINE (LUVOX) 50 MG tablet Take 50-100 mg by mouth at bedtime.      Fremanezumab-vfrm (AJOVY) 225 MG/1.5ML SOAJ Inject 225 mg into the skin every 30 (thirty) days. 1.5 mL 11   gabapentin (NEURONTIN) 600 MG tablet TAKE 1 TABLET BY MOUTH 3 TIMES DAILY 270 tablet 1   ibuprofen (ADVIL) 800 MG tablet Take 1 tablet (800 mg total) by mouth 3 (three) times daily. 21 tablet 0   JUNEL FE 1/20  1-20 MG-MCG tablet Take 1 tablet by mouth daily.     meclizine (ANTIVERT) 25 MG tablet Take 1 tablet (25 mg total) by mouth 3 (three) times daily as needed for dizziness. 30 tablet 0   methocarbamol (ROBAXIN) 750 MG tablet TAKE 1 TABLET BY MOUTH EVERY 8 HOURS AS NEEDED FOR MUSCLE SPASMS 270 tablet 0   naratriptan (AMERGE) 2.5 MG tablet TAKE 1 TABLET BY MOUTH AT ONSET OF MIGRAINE. IF RETURNS OR DOES NOT RESOLVE, MAY REPEAT AFTER 4 HOURS. DO NOT EXCEED 2 TABS (5 MG) IN 24 HOURS. 9 tablet 3   omeprazole (PRILOSEC) 40 MG capsule TAKE 1 CAPSULE BY MOUTH DAILY 90 capsule 0   ondansetron (ZOFRAN-ODT) 8 MG disintegrating tablet Take 0.5 tablets (4 mg total) by mouth every 8 (eight) hours as needed for nausea or vomiting. 30 tablet 11   Ubrogepant (UBRELVY) 100 MG TABS Take 1 tablet (100 mg) by mouth at onset of migraine. May take an additional tablet in 2 hours if needed. Max 200 mg per day     verapamil (CALAN-SR) 120 MG CR tablet TAKE 1 TABLET BY MOUTH AT BEDTIME 90 tablet 0   No current facility-administered medications for this visit.    Allergies  Allergen Reactions   Lamotrigine Hives and Swelling   Abilify [Aripiprazole] Other (See Comments)    Weight gain, did not help,    Azithromycin Rash   Depakote [Divalproex Sodium] Other (See Comments)    Hair loss   Erythromycin Base Swelling   Lithium Rash   Penicillins Rash    Has patient had a PCN reaction causing immediate rash, facial/tongue/throat swelling, SOB or lightheadedness with hypotension: Yes- swelling, rash  Has patient had a PCN reaction causing severe rash involving mucus membranes or skin necrosis: Yes Has patient had a PCN reaction that required hospitalization No Has patient had a PCN reaction occurring within the last 10 years: No If all of the above answers are "NO", then may proceed with Cephalosporin use.    Pregabalin Palpitations    Manic symptoms, no sleep x's 3 days, no appetite.   Seroquel [Quetiapine Fumarate]  Other (See Comments)    Irritable   Sulfa Antibiotics Rash   Tetanus Toxoids Other (See Comments)    Swelling to injection site   Tramadol Nausea And Vomiting   Ziprasidone Hcl Other (See Comments)    Heat/cold intolerance.     Family History  Problem Relation Age of Onset   Stroke Mother    Diabetes Mother    Hypertension Mother    Brain cancer Father    Healthy Sister    Ovarian cancer Maternal Grandmother        metastasized   Lung cancer Maternal Grandfather    Cancer Paternal Grandmother        not sure of the type   Cancer Paternal Grandfather  not sure of the type   Migraines Neg Hx     Social History   Socioeconomic History   Marital status: Married    Spouse name: [name]   Number of children: 0   Years of education: some college   Highest education level: Not on file  Occupational History    Employer: OTHER    Comment: disabled  Tobacco Use   Smoking status: Never   Smokeless tobacco: Never  Vaping Use   Vaping status: Never Used  Substance and Sexual Activity   Alcohol use: No    Alcohol/week: 0.0 standard drinks of alcohol   Drug use: No   Sexual activity: Yes    Birth control/protection: Other-see comments    Comment: also on OCPs, husband had vasectomy  Other Topics Concern   Not on file  Social History Narrative   From: [place], moved because her husband was from [place]   Living: with husband [name], since 1990   Work: disability due to fibromyalgia and bipolar disorder   Works part time 2 days per week in [employer]       Family:has a sister who lives in [place]      Enjoys: crochet, baking, reading      Exercise: walking 19,000 steps 2 days per week, yoga - tries to do 3 times a week   Diet: not great, more sweets than she should      Safety   Seat belts: Yes    Guns: Yes  and secure   Safe in relationships: Yes          Right handed   Caffeine: none    Social Determinants of Health   Financial Resource Strain: Not on file   Food Insecurity: No Food Insecurity (12/19/2022)   Hunger Vital Sign    Worried About Running Out of Food in the Last Year: Never true    Ran Out of Food in the Last Year: Never true  Transportation Needs: No Transportation Needs (12/19/2022)   PRAPARE - Administrator, Civil Service (Medical): No    Lack of Transportation (Non-Medical): No  Physical Activity: Not on file  Stress: Not on file  Social Connections: Unknown (02/01/2023)   [facility]   Social Network    Social Network: Not on file  Intimate Partner Violence: Unknown (02/01/2023)   Received from [facility]   HITS    Physically Hurt: Not on file    Insult or Talk Down To: Not on file    Threaten Physical Harm: Not on file    Scream or Curse: Not on file     Constitutional: Patient reports intermittent headaches, chronic fatigue.  Denies fever, malaise, or abrupt weight changes.  HEENT: Denies eye pain, eye redness, ear pain, ringing in the ears, wax buildup, runny nose, nasal congestion, bloody nose, or sore throat. Respiratory: Denies difficulty breathing, shortness of breath, cough or sputum production.   Cardiovascular: Denies chest pain, chest tightness, palpitations or swelling in the hands or feet.  Gastrointestinal: Denies abdominal pain, bloating, constipation, diarrhea or blood in the stool.  GU: Denies urgency, frequency, pain with urination, burning sensation, blood in urine, odor or discharge. Musculoskeletal: Patient reports chronic muscle pain, right upper and lower extremity pain.  Denies decrease in range of motion, difficulty with gait, or joint pain and swelling.  Skin: Patient reports easy bruising.  Denies redness, rashes, lesions or ulcercations.  Neurological: Patient reports dizziness.  Denies difficulty with memory, difficulty  with speech or problems with balance and coordination.  Psych: Patient has a history of anxiety and depression.  Denies SI/HI.  No other  specific complaints in a complete review of systems (except as listed in HPI above).  Objective:   Physical Exam  BP 108/72 (BP Location: Right Arm, Patient Position: Sitting, Cuff Size: Normal)   Pulse 91   Temp (!) 96.4 F (35.8 C) (Temporal)   Wt 164 lb (74.4 kg)   SpO2 98%   BMI 24.22 kg/m   Wt Readings from Last 3 Encounters:  03/12/23 155 lb (70.3 kg)  03/10/23 155 lb (70.3 kg)  02/18/23 156 lb (70.8 kg)    General: Appears her stated age, well developed, well nourished in NAD. Skin: Warm, dry and intact.  Healing bruise noted on the posterior left calf. HEENT: Head: normal shape and size; Eyes: sclera white, no icterus, conjunctiva pink, PERRLA and EOMs intact;  Cardiovascular: Normal rate and rhythm. S1,S2 noted.  No murmur, rubs or gallops noted. No JVD or BLE edema.  Radial and pedal pulse 2+ on the right.  Cap refill <3 seconds. Pulmonary/Chest: Normal effort and positive vesicular breath sounds. No respiratory distress. No wheezes, rales or ronchi noted.  Musculoskeletal: Normal flexion, extension, rotation and lateral bending of the spine.  Normal internal and external rotation of the right shoulder.  Shoulder shrug equal.  Strength 5/5 BUE.  Handgrips equal.  No difficulty with gait.  Neurological: Alert and oriented. Coordination normal.  Psychiatric: Mood and affect normal. Behavior is normal. Judgment and thought content normal.    BMET    Component Value Date/Time   NA 141 03/12/2023 2151   NA 141 06/17/2014 1511   K 3.9 03/12/2023 2151   K 4.0 06/17/2014 1511   CL 108 03/12/2023 2151   CL 109 (H) 06/17/2014 1511   CO2 24 03/12/2023 2151   CO2 23 06/17/2014 1511   GLUCOSE 98 03/12/2023 2151   GLUCOSE 88 06/17/2014 1511   BUN 13 03/12/2023 2151   BUN 11 06/17/2014 1511   CREATININE 1.04 (H) 03/12/2023 2151   CREATININE 1.15 (H) 12/10/2022 0821   CALCIUM 8.4 (L) 03/12/2023 2151   CALCIUM 8.4 (L) 06/17/2014 1511   GFRNONAA >60 03/12/2023 2151    GFRNONAA 78 04/28/2020 0905   GFRAA 90 04/28/2020 0905    Lipid Panel     Component Value Date/Time   CHOL 312 (H) 12/19/2022 0846   CHOL 203 (H) 02/04/2012 0540   TRIG 341 (H) 12/19/2022 0846   TRIG 300 (H) 02/04/2012 0540   HDL 56 12/19/2022 0846   HDL 29 (L) 02/04/2012 0540   CHOLHDL 5.6 12/19/2022 0846   VLDL 68 (H) 12/19/2022 0846   VLDL 60 (H) 02/04/2012 0540   LDLCALC 188 (H) 12/19/2022 0846   LDLCALC 179 (H) 12/10/2022 0821   LDLCALC 114 (H) 02/04/2012 0540    CBC    Component Value Date/Time   WBC 9.0 03/12/2023 2151   RBC 4.37 03/12/2023 2151   HGB 12.6 03/12/2023 2151   HGB 14.9 06/17/2014 1511   HCT 38.3 03/12/2023 2151   HCT 46.4 06/17/2014 1511   PLT 283 03/12/2023 2151   PLT 290 06/17/2014 1511   MCV 87.6 03/12/2023 2151   MCV 85 06/17/2014 1511   MCH 28.8 03/12/2023 2151   MCHC 32.9 03/12/2023 2151   RDW 13.2 03/12/2023 2151   RDW 14.7 (H) 06/17/2014 1511   LYMPHSABS 1,274 09/17/2021 1059   LYMPHSABS 4.2 (H) 11/05/2012 1610  MONOABS 0.5 02/21/2021 0445   MONOABS 1.0 (H) 11/05/2012 0547   EOSABS 102 09/17/2021 1059   EOSABS 0.1 11/05/2012 0547   BASOSABS 112 09/17/2021 1059   BASOSABS 0.1 11/05/2012 0547    Hgb A1C Lab Results  Component Value Date   HGBA1C 5.4 12/19/2022            Assessment & Plan:   Urgent care and ER follow-up for dental pain, abscessed tooth, migraines, POTS, fibromyalgia:  ER notes, labs and imaging reviewed Will check CK and GGT for further evaluation of her symptoms Rx for mirtazapine 50 mg nightly as needed to help her sleep Rx for Ultram 50 mg every 8 hours as needed for severe pain only Consider repeat MRI cervical and lumbar spine versus referral to PT or neurosurgery for further evaluation She will follow-up with neurology and cardiology as previously scheduled recommended by hospital MD  RTC in 3 months, follow-up chronic conditions [name], NP

## 2023-03-15 NOTE — Patient Instructions (Signed)
Myofascial Pain Syndrome and Fibromyalgia Myofascial pain syndrome and fibromyalgia are both pain disorders. You may feel this pain mainly in your muscles. Myofascial pain syndrome: Always has tender points in the muscles that will cause pain when pressed (trigger points). The pain may come and go. Usually affects your neck, upper back, and shoulder areas. The pain often moves into your arms and hands. Fibromyalgia: Has muscle pains and tenderness that come and go. Is often associated with tiredness (fatigue) and sleep problems. Has trigger points. Tends to be long-lasting (chronic), but is not life-threatening. Fibromyalgia and myofascial pain syndrome are not the same. However, they often occur together. If you have both conditions, each can make the other worse. Both are common and can cause enough pain and fatigue to make day-to-day activities difficult. Both can be hard to diagnose because their symptoms are common in many other conditions. What are the causes? The exact causes of these conditions are not known. What increases the risk? You are more likely to develop either of these conditions if: You have a family history of the condition. You are female. You have certain triggers, such as: Spine disorders. An injury (trauma) or other physical stressors. Being under a lot of stress. Medical conditions such as osteoarthritis, rheumatoid arthritis, or lupus. What are the signs or symptoms? Fibromyalgia The main symptom of fibromyalgia is widespread pain and tenderness in your muscles. Pain is sometimes described as stabbing, shooting, or burning. You may also have: Tingling or numbness. Sleep problems and fatigue. Problems with attention and concentration (fibro fog). Other symptoms may include: Bowel and bladder problems. Headaches. Vision problems. Sensitivity to odors and noises. Depression or mood changes. Painful menstrual periods (dysmenorrhea). Dry skin or eyes. These  symptoms can vary over time. Myofascial pain syndrome Symptoms of myofascial pain syndrome include: Tight, ropy bands of muscle. Uncomfortable sensations in muscle areas. These may include aching, cramping, burning, numbness, tingling, and weakness. Difficulty moving certain parts of the body freely (poor range of motion). How is this diagnosed? This condition may be diagnosed by your symptoms and medical history. You will also have a physical exam. In general: Fibromyalgia is diagnosed if you have pain, fatigue, and other symptoms for more than 3 months, and symptoms cannot be explained by another condition. Myofascial pain syndrome is diagnosed if you have trigger points in your muscles, and those trigger points are tender and cause pain elsewhere in your body (referred pain). How is this treated? Treatment for these conditions depends on the type that you have. For fibromyalgia, a healthy lifestyle is the most important treatment including aerobic and strength exercises. Different types of medicines are used to help treat pain and include: NSAIDs. Medicines for treating depression. Medicines that help control seizures. Medicines that relax the muscles. Treatment for myofascial pain syndrome includes: Pain medicines, such as NSAIDs. Cooling and stretching of muscles. Massage therapy with myofascial release technique. Trigger point injections. Treating these conditions often requires a team of health care providers. These may include: Your primary care provider. A physical therapist. Complementary health care providers, such as massage therapists or acupuncturists. A psychiatrist for cognitive behavioral therapy. Follow these instructions at home: Medicines Take over-the-counter and prescription medicines only as told by your health care provider. Ask your health care provider if the medicine prescribed to you: Requires you to avoid driving or using machinery. Can cause constipation.  You may need to take these actions to prevent or treat constipation: Drink enough fluid to keep your urine pale  yellow. Take over-the-counter or prescription medicines. Eat foods that are high in fiber, such as beans, whole grains, and fresh fruits and vegetables. Limit foods that are high in fat and processed sugars, such as fried or sweet foods. Lifestyle  Do exercises as told by your health care provider or physical therapist. Practice relaxation techniques to control your stress. You may want to try: Biofeedback. Visual imagery. Hypnosis. Muscle relaxation. Yoga. Meditation. Maintain a healthy lifestyle. This includes eating a healthy diet and getting enough sleep. Do not use any products that contain nicotine or tobacco. These products include cigarettes, chewing tobacco, and vaping devices, such as e-cigarettes. If you need help quitting, ask your health care provider. General instructions Talk to your health care provider about complementary treatments, such as acupuncture or massage. Do not do activities that stress or strain your muscles. This includes repetitive motions and heavy lifting. Keep all follow-up visits. This is important. Where to find support Consider joining a support group with others who are diagnosed with this condition. National Fibromyalgia Association: fmaware.org Where to find more information U.S. Pain Foundation: uspainfoundation.org Contact a health care provider if: You have new symptoms. Your symptoms get worse or your pain is severe. You have side effects from your medicines. You have trouble sleeping. Your condition is causing depression or anxiety. Get help right away if: You have thoughts of hurting yourself or others. Get help right away if you feel like you may hurt yourself or others, or have thoughts about taking your own life. Go to your nearest emergency room or: Call 911. Call the National Suicide Prevention Lifeline at (828)061-7040  or 988. This is open 24 hours a day. Text the Crisis Text Line at 915-142-9823. This information is not intended to replace advice given to you by your health care provider. Make sure you discuss any questions you have with your health care provider. Document Revised: 03/15/2022 Document Reviewed: 05/08/2021 Elsevier Patient Education  2024 [employer].

## 2023-03-16 LAB — GAMMA GT: GGT: 17 U/L (ref 3–70)

## 2023-03-16 LAB — CK: Total CK: 42 U/L (ref 29–143)

## 2023-03-17 ENCOUNTER — Encounter: Payer: Self-pay | Admitting: Internal Medicine

## 2023-03-17 DIAGNOSIS — F411 Generalized anxiety disorder: Secondary | ICD-10-CM | POA: Diagnosis not present

## 2023-03-17 DIAGNOSIS — G894 Chronic pain syndrome: Secondary | ICD-10-CM

## 2023-03-17 DIAGNOSIS — G478 Other sleep disorders: Secondary | ICD-10-CM | POA: Diagnosis not present

## 2023-03-17 DIAGNOSIS — F3132 Bipolar disorder, current episode depressed, moderate: Secondary | ICD-10-CM | POA: Diagnosis not present

## 2023-03-17 DIAGNOSIS — R5382 Chronic fatigue, unspecified: Secondary | ICD-10-CM | POA: Diagnosis not present

## 2023-03-17 DIAGNOSIS — Z79899 Other long term (current) drug therapy: Secondary | ICD-10-CM | POA: Diagnosis not present

## 2023-03-17 DIAGNOSIS — M797 Fibromyalgia: Secondary | ICD-10-CM

## 2023-03-24 ENCOUNTER — Encounter: Payer: Self-pay | Admitting: Internal Medicine

## 2023-03-24 DIAGNOSIS — R42 Dizziness and giddiness: Secondary | ICD-10-CM

## 2023-03-28 ENCOUNTER — Other Ambulatory Visit: Payer: Self-pay | Admitting: Internal Medicine

## 2023-03-29 ENCOUNTER — Other Ambulatory Visit: Payer: Self-pay | Admitting: Internal Medicine

## 2023-03-29 MED ORDER — MECLIZINE HCL 25 MG PO TABS: 25.0000 mg | ORAL_TABLET | Freq: Three times a day (TID) | ORAL | 0 refills | Status: DC | PRN

## 2023-03-29 MED ORDER — TRAMADOL HCL 50 MG PO TABS
50.0000 mg | ORAL_TABLET | Freq: Every day | ORAL | 0 refills | Status: AC | PRN
Start: 2023-03-29 — End: 2023-04-28

## 2023-03-29 NOTE — Telephone Encounter (Signed)
Requested medication (s) are due for refill today:   Requested medication (s) are on the active medication list: No  Last refill:  03/15/23  Future visit scheduled: Yes  Notes to clinic:  Not delegated.    Requested Prescriptions  Pending Prescriptions Disp Refills   traMADol (ULTRAM) 50 MG tablet [Pharmacy Med Name: TRAMADOL HCL 50 MG TAB] 15 tablet     Sig: TAKE 1 TABLET BY MOUTH EVERY 8 HOURS AS NEEDED     Not Delegated - Analgesics:  Opioid Agonists Failed - 03/28/2023 10:53 AM      Failed - This refill cannot be delegated      Passed - Urine Drug Screen completed in last 360 days      Passed - Valid encounter within last 3 months    Recent Outpatient Visits           2 weeks ago Right arm pain   [facility] [place], [name], NP   1 month ago Thinning hair   [facility] [place], [name], NP   3 months ago Encounter for general adult medical examination with abnormal findings   [place] [facility] [place], [name], NP   5 months ago Fibromyalgia   [place] [facility] [place], [name], NP   7 months ago Fibromyalgia   [place] [facility] [name], DO       Future Appointments             In 2 months [place], [name], NP [facility]            Signed Prescriptions Disp Refills   verapamil (CALAN-SR) 120 MG CR tablet 90 tablet 0    Sig: TAKE 1 TABLET BY MOUTH AT BEDTIME     Cardiovascular: Calcium Channel Blockers 3 Failed - 03/28/2023 10:53 AM      Failed - Cr in normal range and within 360 days    Creat  Date Value Ref Range Status  12/10/2022 1.15 (H) 0.50 - 1.03 mg/dL Final   Creatinine, Ser  Date Value Ref Range Status  03/12/2023 1.04 (H) 0.44 - 1.00 mg/dL Final         Passed - ALT in normal range and within 360 days    ALT  Date Value Ref Range Status  03/13/2023 12 0 - 44 U/L Final    SGPT (ALT)  Date Value Ref Range Status  06/17/2014 16 U/L Final    Comment:    14-63 NOTE: New Reference Range 01/08/14          Passed - AST in normal range and within 360 days    AST  Date Value Ref Range Status  03/13/2023 16 15 - 41 U/L Final   SGOT(AST)  Date Value Ref Range Status  06/17/2014 11 (L) 15 - 37 Unit/L Final         Passed - Last BP in normal range    BP Readings from Last 1 Encounters:  03/15/23 108/72         Passed - Last Heart Rate in normal range    Pulse Readings from Last 1 Encounters:  03/15/23 91         Passed - Valid encounter within last 6 months    Recent Outpatient Visits           2 weeks ago Right arm  pain   [place] [facility] [place], [name], NP   1 month ago Thinning hair   [place] [facility] [place], [name], NP   3 months ago Encounter for general adult medical examination with abnormal findings   [place] [facility] [place], [name], NP   5 months ago Fibromyalgia   [place] [facility] [place], [name], NP   7 months ago Fibromyalgia   [place] [facility] [name], DO       Future Appointments             In 2 months Baity, [name], NP [facility]

## 2023-03-29 NOTE — Telephone Encounter (Signed)
Requested Prescriptions  Pending Prescriptions Disp Refills   verapamil (CALAN-SR) 120 MG CR tablet [Pharmacy Med Name: VERAPAMIL HCL ER 120 MG TAB] 90 tablet 0    Sig: TAKE 1 TABLET BY MOUTH AT BEDTIME     Cardiovascular: Calcium Channel Blockers 3 Failed - 03/28/2023 10:53 AM      Failed - Cr in normal range and within 360 days    Creat  Date Value Ref Range Status  12/10/2022 1.15 (H) 0.50 - 1.03 mg/dL Final   Creatinine, Ser  Date Value Ref Range Status  03/12/2023 1.04 (H) 0.44 - 1.00 mg/dL Final         Passed - ALT in normal range and within 360 days    ALT  Date Value Ref Range Status  03/13/2023 12 0 - 44 U/L Final   SGPT (ALT)  Date Value Ref Range Status  06/17/2014 16 U/L Final    Comment:    14-63 NOTE: New Reference Range 01/08/14          Passed - AST in normal range and within 360 days    AST  Date Value Ref Range Status  03/13/2023 16 15 - 41 U/L Final   SGOT(AST)  Date Value Ref Range Status  06/17/2014 11 (L) 15 - 37 Unit/L Final         Passed - Last BP in normal range    BP Readings from Last 1 Encounters:  03/15/23 108/72         Passed - Last Heart Rate in normal range    Pulse Readings from Last 1 Encounters:  03/15/23 91         Passed - Valid encounter within last 6 months    Recent Outpatient Visits           2 weeks ago Right arm pain   [facility] [place], [name], NP   1 month ago Thinning hair   [place] [facility] [place], [name], NP   3 months ago Encounter for general adult medical examination with abnormal findings   [place] [facility] [place], [name], NP   5 months ago Fibromyalgia   [facility] [place], [name], NP   7 months ago Fibromyalgia   [facility] [name], DO       Future Appointments             In 2 months Baity, [name], NP [facility] [place]             traMADol (ULTRAM) 50 MG tablet [Pharmacy Med Name: TRAMADOL HCL 50 MG TAB] 15 tablet     Sig: TAKE 1 TABLET BY MOUTH EVERY 8 HOURS AS NEEDED     Not Delegated - Analgesics:  Opioid Agonists Failed - 03/28/2023 10:53 AM      Failed - This refill cannot be delegated      Passed - Urine Drug Screen completed in last 360 days      Passed - Valid encounter within last 3 months    Recent Outpatient Visits           2 weeks ago Right arm pain   [place] [facility] [place], [name], NP   1 month ago Thinning hair    [facility] [place], [name], NP   3 months  ago Encounter for general adult medical examination with abnormal findings   [place] [facility] [place], [name], NP   5 months ago Fibromyalgia   [place] [facility] [place], [name], NP   7 months ago Fibromyalgia    [facility] [name], DO       Future Appointments             In 2 months Baity, [name], NP [facility], [facility]

## 2023-04-14 ENCOUNTER — Telehealth: Payer: Self-pay

## 2023-04-14 NOTE — Telephone Encounter (Signed)
Transition Care Management Unsuccessful Follow-up Telephone Call  Date of discharge and from where:  03/13/2023 [facility]  Attempts:  1st Attempt  Reason for unsuccessful TCM follow-up call:  Left voice message  Wrenn Willcox [facility]  [facility], [facility] Resource Care Guide Direct Dial: [phone]  Website: [email or web address]

## 2023-04-15 ENCOUNTER — Encounter: Payer: Self-pay | Admitting: Internal Medicine

## 2023-04-15 ENCOUNTER — Telehealth: Payer: Self-pay

## 2023-04-15 DIAGNOSIS — M797 Fibromyalgia: Secondary | ICD-10-CM

## 2023-04-15 NOTE — Telephone Encounter (Signed)
Transition Care Management Follow-up Telephone Call Date of discharge and from where: 03/13/2023 [facility] How have you been since you were released from the hospital? Patient stated she is feeling better. Declined to participate further.  Hamilton Marinello [facility]  [facility], [facility] Guide Direct Dial: [phone]  Website: [email or web address]

## 2023-04-18 ENCOUNTER — Other Ambulatory Visit: Payer: Self-pay | Admitting: Internal Medicine
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
## 2023-04-19 ENCOUNTER — Encounter: Payer: Self-pay | Admitting: Internal Medicine

## 2023-04-19 MED ORDER — TRAMADOL HCL 50 MG PO TABS
50.0000 mg | ORAL_TABLET | Freq: Three times a day (TID) | ORAL | 0 refills | Status: AC | PRN
Start: 2023-04-19 — End: 2023-04-24

## 2023-04-19 NOTE — Telephone Encounter (Signed)
Requested medications are due for refill today.  no  Requested medications are on the active medications list.  yes  Last refill. today  Future visit scheduled.   yes  Notes to clinic.  Refill/refusal not delegated.    Requested Prescriptions  Pending Prescriptions Disp Refills   traMADol (ULTRAM) 50 MG tablet [Pharmacy Med Name: TRAMADOL HCL 50 MG TAB] 15 tablet     Sig: TAKE 1 TABLET BY MOUTH DAILY AS NEEDED     Not Delegated - Analgesics:  Opioid Agonists Failed - 04/18/2023 12:20 PM      Failed - This refill cannot be delegated      Passed - Urine Drug Screen completed in last 360 days      Passed - Valid encounter within last 3 months    Recent Outpatient Visits           1 month ago Right arm pain   [place] [facility] [place], [name], NP   2 months ago Thinning hair   [place] [facility] [place], [name], NP   4 months ago Encounter for general adult medical examination with abnormal findings   [place] [facility] [place], [name], NP   6 months ago Fibromyalgia   [place] [facility] [place], [name], NP   8 months ago Fibromyalgia   [place] [facility] [name], DO       Future Appointments             In 1 month Baity, [name], NP [facility], [facility]

## 2023-04-20 ENCOUNTER — Ambulatory Visit: Payer: Medicare HMO | Admitting: Pain Medicine

## 2023-04-21 DIAGNOSIS — F411 Generalized anxiety disorder: Secondary | ICD-10-CM | POA: Diagnosis not present

## 2023-04-21 DIAGNOSIS — G478 Other sleep disorders: Secondary | ICD-10-CM | POA: Diagnosis not present

## 2023-04-21 DIAGNOSIS — R5382 Chronic fatigue, unspecified: Secondary | ICD-10-CM | POA: Diagnosis not present

## 2023-04-21 DIAGNOSIS — Z79899 Other long term (current) drug therapy: Secondary | ICD-10-CM | POA: Diagnosis not present

## 2023-04-21 DIAGNOSIS — F3132 Bipolar disorder, current episode depressed, moderate: Secondary | ICD-10-CM | POA: Diagnosis not present

## 2023-04-28 DIAGNOSIS — M5489 Other dorsalgia: Secondary | ICD-10-CM | POA: Diagnosis not present

## 2023-04-28 DIAGNOSIS — Z6823 Body mass index (BMI) 23.0-23.9, adult: Secondary | ICD-10-CM | POA: Diagnosis not present

## 2023-04-28 DIAGNOSIS — M542 Cervicalgia: Secondary | ICD-10-CM | POA: Diagnosis not present

## 2023-04-28 DIAGNOSIS — G8929 Other chronic pain: Secondary | ICD-10-CM | POA: Diagnosis not present

## 2023-04-28 DIAGNOSIS — M797 Fibromyalgia: Secondary | ICD-10-CM | POA: Diagnosis not present

## 2023-04-28 DIAGNOSIS — Z1159 Encounter for screening for other viral diseases: Secondary | ICD-10-CM | POA: Diagnosis not present

## 2023-04-28 DIAGNOSIS — M129 Arthropathy, unspecified: Secondary | ICD-10-CM | POA: Diagnosis not present

## 2023-04-28 DIAGNOSIS — Z79899 Other long term (current) drug therapy: Secondary | ICD-10-CM | POA: Diagnosis not present

## 2023-04-28 DIAGNOSIS — E559 Vitamin D deficiency, unspecified: Secondary | ICD-10-CM | POA: Diagnosis not present

## 2023-04-28 DIAGNOSIS — Z131 Encounter for screening for diabetes mellitus: Secondary | ICD-10-CM | POA: Diagnosis not present

## 2023-05-02 DIAGNOSIS — Z79899 Other long term (current) drug therapy: Secondary | ICD-10-CM | POA: Diagnosis not present

## 2023-05-03 DIAGNOSIS — M797 Fibromyalgia: Secondary | ICD-10-CM | POA: Diagnosis not present

## 2023-05-03 DIAGNOSIS — Z6823 Body mass index (BMI) 23.0-23.9, adult: Secondary | ICD-10-CM | POA: Diagnosis not present

## 2023-05-03 DIAGNOSIS — G8929 Other chronic pain: Secondary | ICD-10-CM | POA: Diagnosis not present

## 2023-05-03 DIAGNOSIS — N1831 Chronic kidney disease, stage 3a: Secondary | ICD-10-CM | POA: Diagnosis not present

## 2023-05-03 DIAGNOSIS — Z79899 Other long term (current) drug therapy: Secondary | ICD-10-CM | POA: Diagnosis not present

## 2023-05-03 DIAGNOSIS — M542 Cervicalgia: Secondary | ICD-10-CM | POA: Diagnosis not present

## 2023-05-03 DIAGNOSIS — M5489 Other dorsalgia: Secondary | ICD-10-CM | POA: Diagnosis not present

## 2023-05-03 DIAGNOSIS — Z5320 Procedure and treatment not carried out because of patient's decision for unspecified reasons: Secondary | ICD-10-CM | POA: Diagnosis not present

## 2023-05-05 DIAGNOSIS — Z79899 Other long term (current) drug therapy: Secondary | ICD-10-CM | POA: Diagnosis not present

## 2023-05-17 ENCOUNTER — Other Ambulatory Visit: Payer: Self-pay | Admitting: Internal Medicine

## 2023-05-17 NOTE — Telephone Encounter (Signed)
Requested medication (s) are due for refill today - yes  Requested medication (s) are on the active medication list -yes  Future visit scheduled -yes  Last refill: 12/16/22 #270  Notes to clinic: non delegated Rx  Requested Prescriptions  Pending Prescriptions Disp Refills   methocarbamol (ROBAXIN) 750 MG tablet [Pharmacy Med Name: METHOCARBAMOL 750 MG TAB] 270 tablet 0    Sig: TAKE 1 TABLET BY MOUTH EVERY 8 HOURS AS NEEDED FOR MUSCLE SPASMS     Not Delegated - Analgesics:  Muscle Relaxants Failed - 05/17/2023  9:53 AM      Failed - This refill cannot be delegated      Passed - Valid encounter within last 6 months    Recent Outpatient Visits           2 months ago Right arm pain   [place] [facility] [place], [name], NP   2 months ago Thinning hair   [place] [facility] [place], [name], NP   5 months ago Encounter for general adult medical examination with abnormal findings   [place] [facility] [place], [name], NP   7 months ago Fibromyalgia   [place] [facility], [name], NP   9 months ago Fibromyalgia   [place] [facility] [place] [name], DO       Future Appointments             In 3 weeks Sampson Si, [name], NP [facility], [facility]   In 2 months Alfonse Alpers, Wyatt Mage, FNP [facility] [place], [facility]               Requested Prescriptions  Pending Prescriptions Disp Refills   methocarbamol (ROBAXIN) 750 MG tablet [Pharmacy Med Name: METHOCARBAMOL 750 MG TAB] 270 tablet 0    Sig: TAKE 1 TABLET BY MOUTH EVERY 8 HOURS AS NEEDED FOR MUSCLE SPASMS     Not Delegated - Analgesics:  Muscle Relaxants Failed - 05/17/2023  9:53 AM      Failed - This refill cannot be delegated      Passed - Valid encounter within last 6 months    Recent Outpatient Visits           2 months ago Right arm pain   [place] [facility] [place], [name], NP   2 months ago Thinning hair   [place] [facility] [place], [name], NP   5 months ago Encounter for general adult medical examination with abnormal findings   [place] [facility] [place], [name], NP   7 months ago Fibromyalgia   [place] [facility] [place], [name], NP   9 months ago Fibromyalgia   [place] [facility] [name], DO       Future Appointments             In 3 weeks Baity, [name], NP [place] [facility], [facility]   In 2 months Alfonse Alpers, Wyatt Mage, FNP [facility] [place], [facility]

## 2023-05-25 ENCOUNTER — Other Ambulatory Visit: Payer: Self-pay | Admitting: Internal Medicine

## 2023-05-26 NOTE — Telephone Encounter (Signed)
Requested Prescriptions  Pending Prescriptions Disp Refills   omeprazole (PRILOSEC) 40 MG capsule [Pharmacy Med Name: OMEPRAZOLE DR 40 MG CAP] 90 capsule 0    Sig: TAKE 1 CAPSULE BY MOUTH DAILY     Gastroenterology: Proton Pump Inhibitors Passed - 05/25/2023  1:48 PM      Passed - Valid encounter within last 12 months    Recent Outpatient Visits           2 months ago Right arm pain   [place] [facility] [place], [name], NP   3 months ago Thinning hair   [place] [facility] [place], [name], NP   5 months ago Encounter for general adult medical examination with abnormal findings   [place] [facility] [place], [name], NP   7 months ago Fibromyalgia   [place] [facility] [place], [name], NP   9 months ago Fibromyalgia   [place] [facility] [name], DO       Future Appointments             In 2 weeks Sampson Si, [name], NP  [facility], [facility]   In 2 months Alfonse Alpers, Wyatt Mage, FNP [facility] [place], [facility]

## 2023-05-30 DIAGNOSIS — Z79899 Other long term (current) drug therapy: Secondary | ICD-10-CM | POA: Diagnosis not present

## 2023-05-30 DIAGNOSIS — M5489 Other dorsalgia: Secondary | ICD-10-CM | POA: Diagnosis not present

## 2023-05-30 DIAGNOSIS — Z6823 Body mass index (BMI) 23.0-23.9, adult: Secondary | ICD-10-CM | POA: Diagnosis not present

## 2023-05-30 DIAGNOSIS — M797 Fibromyalgia: Secondary | ICD-10-CM | POA: Diagnosis not present

## 2023-05-30 DIAGNOSIS — G8929 Other chronic pain: Secondary | ICD-10-CM | POA: Diagnosis not present

## 2023-05-30 DIAGNOSIS — M542 Cervicalgia: Secondary | ICD-10-CM | POA: Diagnosis not present

## 2023-05-30 DIAGNOSIS — N1831 Chronic kidney disease, stage 3a: Secondary | ICD-10-CM | POA: Diagnosis not present

## 2023-06-01 DIAGNOSIS — Z79899 Other long term (current) drug therapy: Secondary | ICD-10-CM | POA: Diagnosis not present

## 2023-06-07 DIAGNOSIS — M25519 Pain in unspecified shoulder: Secondary | ICD-10-CM | POA: Diagnosis not present

## 2023-06-07 DIAGNOSIS — G8929 Other chronic pain: Secondary | ICD-10-CM | POA: Diagnosis not present

## 2023-06-07 DIAGNOSIS — M542 Cervicalgia: Secondary | ICD-10-CM | POA: Diagnosis not present

## 2023-06-07 DIAGNOSIS — M62838 Other muscle spasm: Secondary | ICD-10-CM | POA: Diagnosis not present

## 2023-06-13 ENCOUNTER — Ambulatory Visit: Payer: Medicare HMO | Admitting: Internal Medicine

## 2023-06-24 ENCOUNTER — Ambulatory Visit (INDEPENDENT_AMBULATORY_CARE_PROVIDER_SITE_OTHER): Payer: Medicare Other | Admitting: Internal Medicine

## 2023-06-24 ENCOUNTER — Encounter: Payer: Self-pay | Admitting: Internal Medicine

## 2023-06-24 VITALS — BP 110/72 | Ht 69.0 in | Wt 158.2 lb

## 2023-06-24 DIAGNOSIS — F5101 Primary insomnia: Secondary | ICD-10-CM

## 2023-06-24 DIAGNOSIS — I10 Essential (primary) hypertension: Secondary | ICD-10-CM | POA: Diagnosis not present

## 2023-06-24 DIAGNOSIS — M8588 Other specified disorders of bone density and structure, other site: Secondary | ICD-10-CM

## 2023-06-24 DIAGNOSIS — M797 Fibromyalgia: Secondary | ICD-10-CM

## 2023-06-24 DIAGNOSIS — G90A Postural orthostatic tachycardia syndrome (POTS): Secondary | ICD-10-CM

## 2023-06-24 DIAGNOSIS — K582 Mixed irritable bowel syndrome: Secondary | ICD-10-CM

## 2023-06-24 DIAGNOSIS — G4700 Insomnia, unspecified: Secondary | ICD-10-CM | POA: Insufficient documentation

## 2023-06-24 DIAGNOSIS — E785 Hyperlipidemia, unspecified: Secondary | ICD-10-CM

## 2023-06-24 DIAGNOSIS — F419 Anxiety disorder, unspecified: Secondary | ICD-10-CM

## 2023-06-24 DIAGNOSIS — K219 Gastro-esophageal reflux disease without esophagitis: Secondary | ICD-10-CM | POA: Diagnosis not present

## 2023-06-24 DIAGNOSIS — G43709 Chronic migraine without aura, not intractable, without status migrainosus: Secondary | ICD-10-CM

## 2023-06-24 DIAGNOSIS — F310 Bipolar disorder, current episode hypomanic: Secondary | ICD-10-CM

## 2023-06-24 MED ORDER — VERAPAMIL HCL ER 120 MG PO TBCR: EXTENDED_RELEASE_TABLET | ORAL | 1 refills | Status: DC

## 2023-06-24 NOTE — Assessment & Plan Note (Signed)
 Continue omeprazole

## 2023-06-24 NOTE — Assessment & Plan Note (Signed)
 Will check lipid profile at annual exam She declines statin therapy or Repatha despite knowing the risk for heart attack, stroke and death Encourage low-fat diet

## 2023-06-24 NOTE — Patient Instructions (Signed)

## 2023-06-24 NOTE — Assessment & Plan Note (Signed)
 Continue mirtazapine per psychiatry

## 2023-06-24 NOTE — Assessment & Plan Note (Signed)
Stable on fluvoxamine, bupropion and clonazepam She will continue to follow with psychiatry

## 2023-06-24 NOTE — Progress Notes (Signed)
 HPI  Patient presents to clinic today to establish care and for management of the conditions listed below.  Anxiety and bipolar depression: Chronic, managed on fluvoxamine , bupropion  and clonazepam .  She is currently seeing a therapist and a psychiatrist.  She denies SI/HI.  Migraines: These occur about 1 x month.  Triggered by stress.  She is taking ajovy , ubrelvy  as prescribed.  She follows with neurology.  POTS: She denies recent syncopal episode. She is taking verapamil  as prescribed. She follows with neurology.  Fibromyalgia: Managed with baclofen , methocarbamol , etodolac  and gabapentin .  She does not follow with pain management.  HTN: Her BP today is 110/72.  She is taking verapamil  as prescribed.  ECG from 02/2023 reviewed.  GERD: Triggered by bending forward.  She denies breakthrough on omeprazole .  There is no upper GI on file.  IBS: She reports alternating constipation or diarrhea.  She takes colace or imodium  as needed with good relief of symptoms.  There is no colonoscopy on file.  Insomnia: She has difficulty falling and staying asleep.  She is taking mirtazapine  as prescribed. There is no sleep study on file.  Osteopenia: She is taking calcium and vitamin d  OTC.  Bone density from 02/2013 reviewed.  HLD: Her last LDL was 188, triglycerides 341, 11/2022.  She is not taking any cholesterol-lowering medication at this time. She is statin intolerant due to myalgias. She tries to consume low-fat diet.  Past Medical History:  Diagnosis Date   Arthritis    Bipolar disorder (HCC)    Fibromyalgia    on disability   Migraine    Muscle spasm    Pain of right side of body    chronic   Panic attack    POTS (postural orthostatic tachycardia syndrome)    Syncope    recurrent   Uterine perforation    hx of    Current Outpatient Medications  Medication Sig Dispense Refill   acetaminophen  (TYLENOL ) 500 MG tablet Take 1,000 mg by mouth daily as needed for mild pain.     buPROPion   (WELLBUTRIN  XL) 150 MG 24 hr tablet Take 1 tablet (150 mg total) by mouth daily. 90 tablet 3   clonazePAM  (KLONOPIN ) 1 MG tablet Take 1 tablet (1 mg total) by mouth 2 (two) times daily as needed. for anxiety 60 tablet 0   etodolac  (LODINE ) 400 MG tablet Take 1 tablet (400 mg total) by mouth daily as needed. 30 tablet 0   fexofenadine (ALLEGRA) 180 MG tablet Take 180 mg by mouth daily.     fluvoxaMINE  (LUVOX ) 50 MG tablet Take 50-100 mg by mouth at bedtime.      Fremanezumab -vfrm (AJOVY ) 225 MG/1.5ML SOAJ Inject 225 mg into the skin every 30 (thirty) days. 1.5 mL 11   gabapentin  (NEURONTIN ) 600 MG tablet TAKE 1 TABLET BY MOUTH 3 TIMES DAILY 270 tablet 1   ibuprofen  (ADVIL ) 800 MG tablet Take 1 tablet (800 mg total) by mouth 3 (three) times daily. 21 tablet 0   JUNEL  FE 1/20 1-20 MG-MCG tablet Take 1 tablet by mouth daily.     meclizine  (ANTIVERT ) 25 MG tablet Take 1 tablet (25 mg total) by mouth 3 (three) times daily as needed for dizziness. 90 tablet 0   methocarbamol  (ROBAXIN ) 750 MG tablet TAKE 1 TABLET BY MOUTH EVERY 8 HOURS AS NEEDED FOR MUSCLE SPASMS 270 tablet 0   mirtazapine  (REMERON ) 15 MG tablet Take 1 tablet (15 mg total) by mouth at bedtime. 30 tablet 0   naratriptan  (AMERGE)  2.5 MG tablet TAKE 1 TABLET BY MOUTH AT ONSET OF MIGRAINE. IF RETURNS OR DOES NOT RESOLVE, MAY REPEAT AFTER 4 HOURS. DO NOT EXCEED 2 TABS (5 MG) IN 24 HOURS. 9 tablet 3   omeprazole  (PRILOSEC) 40 MG capsule TAKE 1 CAPSULE BY MOUTH DAILY 90 capsule 0   ondansetron  (ZOFRAN -ODT) 8 MG disintegrating tablet Take 0.5 tablets (4 mg total) by mouth every 8 (eight) hours as needed for nausea or vomiting. 30 tablet 11   Ubrogepant  (UBRELVY ) 100 MG TABS Take 1 tablet (100 mg) by mouth at onset of migraine. May take an additional tablet in 2 hours if needed. Max 200 mg per day     verapamil  (CALAN -SR) 120 MG CR tablet TAKE 1 TABLET BY MOUTH AT BEDTIME 90 tablet 0   No current facility-administered medications for this visit.     Allergies  Allergen Reactions   Lamotrigine Hives and Swelling   Abilify [Aripiprazole] Other (See Comments)    Weight gain, did not help,    Azithromycin Rash   Depakote [Divalproex Sodium] Other (See Comments)    Hair loss   Erythromycin Base Swelling   Lithium Rash   Penicillins Rash    Has patient had a PCN reaction causing immediate rash, facial/tongue/throat swelling, SOB or lightheadedness with hypotension: Yes- swelling, rash  Has patient had a PCN reaction causing severe rash involving mucus membranes or skin necrosis: Yes Has patient had a PCN reaction that required hospitalization No Has patient had a PCN reaction occurring within the last 10 years: No If all of the above answers are NO, then may proceed with Cephalosporin use.    Pregabalin Palpitations    Manic symptoms, no sleep x's 3 days, no appetite.   Seroquel [Quetiapine Fumarate] Other (See Comments)    Irritable   Sulfa Antibiotics Rash   Tetanus Toxoids Other (See Comments)    Swelling to injection site   Tramadol  Nausea And Vomiting   Ziprasidone Hcl Other (See Comments)    Heat/cold intolerance.     Family History  Problem Relation Age of Onset   Stroke Mother    Diabetes Mother    Hypertension Mother    Brain cancer Father    Healthy Sister    Ovarian cancer Maternal Grandmother        metastasized   Lung cancer Maternal Grandfather    Cancer Paternal Grandmother        not sure of the type   Cancer Paternal Grandfather        not sure of the type   Migraines Neg Hx     Social History   Socioeconomic History   Marital status: Married    Spouse name: Marsha   Number of children: 0   Years of education: some college   Highest education level: Not on file  Occupational History    Employer: OTHER    Comment: disabled  Tobacco Use   Smoking status: Never   Smokeless tobacco: Never  Vaping Use   Vaping status: Never Used  Substance and Sexual Activity   Alcohol use: No     Alcohol/week: 0.0 standard drinks of alcohol   Drug use: No   Sexual activity: Yes    Birth control/protection: Other-see comments    Comment: also on OCPs, husband had vasectomy  Other Topics Concern   Not on file  Social History Narrative   From: [place] , moved because her husband was from [place]   Living: with husband Marsha, since 1990  Work: disability due to fibromyalgia and bipolar disorder   Works part time 2 days per week in [employer]       Family:has a sister who lives in [place]       Enjoys: crochet, baking, reading      Exercise: walking 19,000 steps 2 days per week, yoga - tries to do 3 times a week   Diet: not great, more sweets than she should      Safety   Seat belts: Yes    Guns: Yes  and secure   Safe in relationships: Yes          Right handed   Caffeine: none    Social Drivers of Corporate Investment Banker Strain: Not on file  Food Insecurity: No Food Insecurity (12/19/2022)   Hunger Vital Sign    Worried About Running Out of Food in the Last Year: Never true    Ran Out of Food in the Last Year: Never true  Transportation Needs: No Transportation Needs (12/19/2022)   PRAPARE - Administrator, Civil Service (Medical): No    Lack of Transportation (Non-Medical): No  Physical Activity: Not on file  Stress: Not on file  Social Connections: Unknown (02/01/2023)   Received from [facility]   Social Network    Social Network: Not on file  Intimate Partner Violence: Unknown (02/01/2023)   Received from [facility]   HITS    Physically Hurt: Not on file    Insult or Talk Down To: Not on file    Threaten Physical Harm: Not on file    Scream or Curse: Not on file    ROS:  Constitutional: Patient reports intermittent headaches.  Denies fever, malaise, fatigue, or abrupt weight changes.  HEENT: Denies eye pain, eye redness, ear pain, ringing in the ears, wax buildup, runny nose, nasal congestion, bloody nose, or sore throat. Respiratory: Denies  difficulty breathing, shortness of breath, cough or sputum production.   Cardiovascular: Denies chest pain, chest tightness, palpitations or swelling in the hands or feet.  Gastrointestinal: Patient reports intermittent constipation and diarrhea.  Denies abdominal pain, bloating, or blood in the stool.  GU: Denies frequency, urgency, pain with urination, blood in urine, odor or discharge. Musculoskeletal: Patient reports chronic muscle pain.  Denies decrease in range of motion, difficulty with gait, muscle pain or joint swelling.  Skin: Denies redness, rashes, lesions or ulcercations.  Neurological: Patient reports insomnia.  Denies dizziness, difficulty with memory, difficulty with speech or problems with balance and coordination.  Psych: Patient has a history of anxiety and depression.  Denies SI/HI.  No other specific complaints in a complete review of systems (except as listed in HPI above).  PE: BP 110/72 (BP Location: Left Arm, Patient Position: Sitting, Cuff Size: Normal)   Ht 5' 9 (1.753 m)   Wt 158 lb 3.2 oz (71.8 kg)   BMI 23.36 kg/m    Wt Readings from Last 3 Encounters:  03/15/23 164 lb (74.4 kg)  03/12/23 155 lb (70.3 kg)  03/10/23 155 lb (70.3 kg)    General: Appears her stated age, well developed, well nourished in NAD. HEENT: Head: normal shape and size; Eyes: sclera white, no icterus, conjunctiva pink, PERRLA and EOMs intact;  Cardiovascular: Normal rate and rhythm. S1,S2 noted.  No murmur, rubs or gallops noted. No JVD or BLE edema. No carotid bruits noted. Pulmonary/Chest: Normal effort and positive vesicular breath sounds. No respiratory distress. No wheezes, rales or ronchi noted.  Abdomen:  Normal bowel sounds. Musculoskeletal: Tender points noted of upper back.  Strength 5/5 BUE/BLE.  No difficulty with gait.  Neurological: Alert and oriented. Coordination normal.  Psychiatric: Mood and affect normal. Behavior is normal. Judgment and thought content normal.     BMET    Component Value Date/Time   NA 141 03/12/2023 2151   NA 141 06/17/2014 1511   K 3.9 03/12/2023 2151   K 4.0 06/17/2014 1511   CL 108 03/12/2023 2151   CL 109 (H) 06/17/2014 1511   CO2 24 03/12/2023 2151   CO2 23 06/17/2014 1511   GLUCOSE 98 03/12/2023 2151   GLUCOSE 88 06/17/2014 1511   BUN 13 03/12/2023 2151   BUN 11 06/17/2014 1511   CREATININE 1.04 (H) 03/12/2023 2151   CREATININE 1.15 (H) 12/10/2022 0821   CALCIUM 8.4 (L) 03/12/2023 2151   CALCIUM 8.4 (L) 06/17/2014 1511   GFRNONAA >60 03/12/2023 2151   GFRNONAA 78 04/28/2020 0905   GFRAA 90 04/28/2020 0905    Lipid Panel     Component Value Date/Time   CHOL 312 (H) 12/19/2022 0846   CHOL 203 (H) 02/04/2012 0540   TRIG 341 (H) 12/19/2022 0846   TRIG 300 (H) 02/04/2012 0540   HDL 56 12/19/2022 0846   HDL 29 (L) 02/04/2012 0540   CHOLHDL 5.6 12/19/2022 0846   VLDL 68 (H) 12/19/2022 0846   VLDL 60 (H) 02/04/2012 0540   LDLCALC 188 (H) 12/19/2022 0846   LDLCALC 179 (H) 12/10/2022 0821   LDLCALC 114 (H) 02/04/2012 0540    CBC    Component Value Date/Time   WBC 9.0 03/12/2023 2151   RBC 4.37 03/12/2023 2151   HGB 12.6 03/12/2023 2151   HGB 14.9 06/17/2014 1511   HCT 38.3 03/12/2023 2151   HCT 46.4 06/17/2014 1511   PLT 283 03/12/2023 2151   PLT 290 06/17/2014 1511   MCV 87.6 03/12/2023 2151   MCV 85 06/17/2014 1511   MCH 28.8 03/12/2023 2151   MCHC 32.9 03/12/2023 2151   RDW 13.2 03/12/2023 2151   RDW 14.7 (H) 06/17/2014 1511   LYMPHSABS 1,274 09/17/2021 1059   LYMPHSABS 4.2 (H) 11/05/2012 0547   MONOABS 0.5 02/21/2021 0445   MONOABS 1.0 (H) 11/05/2012 0547   EOSABS 102 09/17/2021 1059   EOSABS 0.1 11/05/2012 0547   BASOSABS 112 09/17/2021 1059   BASOSABS 0.1 11/05/2012 0547    Hgb A1C Lab Results  Component Value Date   HGBA1C 5.4 12/19/2022     Assessment and Plan:  RTC in 6 months for your annual exam Angeline Laura, NP

## 2023-06-24 NOTE — Assessment & Plan Note (Signed)
Continue Colace and Imodium as needed

## 2023-06-24 NOTE — Assessment & Plan Note (Signed)
No recent episodes on verapamil We will monitor

## 2023-06-24 NOTE — Assessment & Plan Note (Signed)
Continue calcium and vitamin D Encourage daily weightbearing exercise 

## 2023-06-24 NOTE — Assessment & Plan Note (Signed)
 Continue baclofen, total methocarbamol and gabapentin Can take Tylenol 1000 mg every 6 hours Encourage regular stretching or yoga

## 2023-06-24 NOTE — Assessment & Plan Note (Signed)
 Continue ajovy and Bernita Raisin she will continue to follow with neurology

## 2023-06-24 NOTE — Assessment & Plan Note (Signed)
Controlled on verapamil Reinforced DASH diet

## 2023-07-14 ENCOUNTER — Telehealth: Payer: Self-pay | Admitting: Neurology

## 2023-07-14 ENCOUNTER — Telehealth: Payer: Medicare Other | Admitting: Neurology

## 2023-07-14 ENCOUNTER — Telehealth: Payer: Self-pay | Admitting: Adult Health

## 2023-07-14 NOTE — Telephone Encounter (Signed)
Called patient, left a message, I'm not feeling well and wanted to see if we could discuss over the phone and possibly I could do anything for her in lieu of our appointment or reschedule I will try her again early this afternoon otherwise will have to reschedule thanks.

## 2023-07-14 NOTE — Telephone Encounter (Signed)
I called patient again, no answer, will have to get her rescheduled thank you

## 2023-07-14 NOTE — Telephone Encounter (Signed)
Pt returned call. Rescheduled and wait listed

## 2023-07-14 NOTE — Telephone Encounter (Signed)
LVM and sent mychart msg informing pt of need to reschedule 07/14/23 appt - MD sick

## 2023-07-21 ENCOUNTER — Encounter: Payer: Self-pay | Admitting: Neurology

## 2023-07-21 DIAGNOSIS — R42 Dizziness and giddiness: Secondary | ICD-10-CM

## 2023-07-21 MED ORDER — MECLIZINE HCL 25 MG PO TABS: 25.0000 mg | ORAL_TABLET | Freq: Three times a day (TID) | ORAL | 1 refills | Status: DC | PRN

## 2023-07-25 ENCOUNTER — Encounter: Payer: Self-pay | Admitting: Internal Medicine

## 2023-07-25 NOTE — Addendum Note (Signed)
Addended by: Lorre Munroe on: 07/25/2023 12:45 PM   Modules accepted: Orders

## 2023-07-29 ENCOUNTER — Ambulatory Visit: Payer: Medicare HMO | Admitting: Family

## 2023-08-18 ENCOUNTER — Other Ambulatory Visit: Payer: Self-pay | Admitting: Internal Medicine

## 2023-08-19 NOTE — Telephone Encounter (Signed)
 Requested by interface surescripts. Future visit in 3 months.  Requested Prescriptions  Pending Prescriptions Disp Refills   omeprazole (PRILOSEC) 40 MG capsule [Pharmacy Med Name: OMEPRAZOLE DR 40 MG CAP] 90 capsule 0    Sig: TAKE 1 CAPSULE BY MOUTH DAILY     Gastroenterology: Proton Pump Inhibitors Passed - 08/19/2023 11:52 AM      Passed - Valid encounter within last 12 months    Recent Outpatient Visits           1 month ago Chronic migraine without aura without status migrainosus, not intractable   [facility] [place], [name], NP   5 months ago Right arm pain   [facility] [place], [name], NP   6 months ago Thinning hair   [facility] [place], [name], NP   8 months ago Encounter for general adult medical examination with abnormal findings   [facility] [place], [name], NP   10 months ago Fibromyalgia    [facility] [place], [name], NP       Future Appointments             In 1 month Alfonse Alpers, [place], FNP [facility] [place], [place]   In 3 months [place], [name], NP [facility] [place], [facility]

## 2023-10-03 ENCOUNTER — Encounter: Payer: Self-pay | Admitting: Family

## 2023-10-03 ENCOUNTER — Ambulatory Visit (INDEPENDENT_AMBULATORY_CARE_PROVIDER_SITE_OTHER): Payer: Medicare HMO | Admitting: Family

## 2023-10-03 VITALS — BP 122/78 | HR 63 | Temp 97.9°F | Ht 69.0 in | Wt 151.0 lb

## 2023-10-03 DIAGNOSIS — G43709 Chronic migraine without aura, not intractable, without status migrainosus: Secondary | ICD-10-CM

## 2023-10-03 DIAGNOSIS — E785 Hyperlipidemia, unspecified: Secondary | ICD-10-CM | POA: Diagnosis not present

## 2023-10-03 DIAGNOSIS — I10 Essential (primary) hypertension: Secondary | ICD-10-CM

## 2023-10-03 DIAGNOSIS — F5101 Primary insomnia: Secondary | ICD-10-CM | POA: Diagnosis not present

## 2023-10-03 DIAGNOSIS — F310 Bipolar disorder, current episode hypomanic: Secondary | ICD-10-CM

## 2023-10-03 DIAGNOSIS — M8588 Other specified disorders of bone density and structure, other site: Secondary | ICD-10-CM

## 2023-10-03 DIAGNOSIS — G90A Postural orthostatic tachycardia syndrome (POTS): Secondary | ICD-10-CM

## 2023-10-03 NOTE — Progress Notes (Signed)
 Established Patient Office Visit  Subjective:  Patient ID: Melissa [name], female    DOB: [birth year]  Age: 53 y.o. MRN: [id number]  CC:  Chief Complaint  Patient presents with  . Establish Care    HPI Melissa [name] is here for a transition of care visit.  Prior provider was: [name], FNP C    chronic concerns:  Bipolar; follows with psychiatrist, [facility] . Does not have a therapist at this time.   Migraines, managed by Dr. Lucia Gaskins, neurology.   Aches and pains, takes tylenol 500 mg once daily. Has arthritis and bone spurs in neck and back.   Fibromyalgia, taking gabapentin 600 mg tid.   HTN: on verapamil 120 mg doing well stable on todays exam.  Has pots but states has been placed on prn with cardiology.   OCP, sees gynecologist. In perimenopause had been spotting constantly. Still with hot flashes.   Sees pain management at [facility], takes daily norco/vicodine 5-325 every six hours.   GERD: still taking prilosec daily, has been on this for some time. Occasional break through heart burn when she bends over she will feel this more especially first thing in the am.  Insomnia, ongoing issue over the last 35 years. Wakes up often at night time, does snore at times. Often with excessive fatigue throughout the day.   Pap: 01/2023 negative  Osteopenia, bone density 53 y/o was placed on fosamax    Past Medical History:  Diagnosis Date  . Arthritis   . Bipolar disorder (HCC)   . Fibromyalgia    on disability  . Migraine   . Muscle spasm   . Pain of right side of body    chronic  . Panic attack   . POTS (postural orthostatic tachycardia syndrome)   . Syncope    recurrent  . Uterine perforation    hx of    Past Surgical History:  Procedure Laterality Date  . CATARACT EXTRACTION  dec 2014  . DILATION AND CURETTAGE OF UTERUS    . LASIK  dec 2014    Family History  Problem Relation Age of Onset  . Stroke Mother   . Diabetes Mother   .  Hypertension Mother   . Brain cancer Father   . Healthy Sister   . Ovarian cancer Maternal Grandmother        metastasized  . Lung cancer Maternal Grandfather        smoker  . Cancer Paternal Grandmother        not sure of the type  . Cancer Paternal Grandfather        not sure of the type  . Migraines Neg Hx     Social History   Socioeconomic History  . Marital status: Married    Spouse name: Melissa [name]  . Number of children: 0  . Years of education: some college  . Highest education level: Not on file  Occupational History    Employer: OTHER    Comment: disabled  Tobacco Use  . Smoking status: Never  . Smokeless tobacco: Never  Vaping Use  . Vaping status: Never Used  Substance and Sexual Activity  . Alcohol use: No    Alcohol/week: 0.0 standard drinks of alcohol  . Drug use: No  . Sexual activity: Yes    Birth control/protection: Other-see comments    Comment: also on OCPs, husband had vasectomy  Other Topics Concern  . Not on file  Social History Narrative  From: [place], moved because her husband was from [place]   Living: with husband Melissa [name], since 1990   Work: disability due to fibromyalgia and bipolar disorder   Works part time 2 days per week in [employer]       Family:has a sister who lives in [place]      Enjoys: crochet, baking, reading      Exercise: walking 19,000 steps 2 days per week, yoga - tries to do 3 times a week   Diet: not great, more sweets than she should      Safety   Seat belts: Yes    Guns: Yes  and secure   Safe in relationships: Yes          Right handed   Caffeine: none    Social Drivers of Corporate investment banker Strain: Not on file  Food Insecurity: No Food Insecurity (12/19/2022)   Hunger Vital Sign   . Worried About Programme researcher, broadcasting/film/video in the Last Year: Never true   . Ran Out of Food in the Last Year: Never true  Transportation Needs: No Transportation Needs (12/19/2022)   PRAPARE - Transportation   . Lack of  Transportation (Medical): No   . Lack of Transportation (Non-Medical): No  Physical Activity: Not on file  Stress: Not on file  Social Connections: Unknown (02/01/2023)   [facility]   Social Network   . Social Network: Not on file  Intimate Partner Violence: Unknown (02/01/2023)   Received from [facility]   HITS   . Physically Hurt: Not on file   . Insult or Talk Down To: Not on file   . Threaten Physical Harm: Not on file   . Scream or Curse: Not on file    Outpatient Medications Prior to Visit  Medication Sig Dispense Refill  . acetaminophen (TYLENOL) 500 MG tablet Take 1,000 mg by mouth daily as needed for mild pain.    Marland Kitchen buPROPion (WELLBUTRIN XL) 150 MG 24 hr tablet Take 1 tablet (150 mg total) by mouth daily. 90 tablet 3  . clonazePAM (KLONOPIN) 1 MG tablet Take 1 tablet (1 mg total) by mouth 2 (two) times daily as needed. for anxiety 60 tablet 0  . fexofenadine (ALLEGRA) 180 MG tablet Take 180 mg by mouth daily.    . fluvoxaMINE (LUVOX) 50 MG tablet Take 50-100 mg by mouth at bedtime.     . gabapentin (NEURONTIN) 600 MG tablet TAKE 1 TABLET BY MOUTH 3 TIMES DAILY 270 tablet 1  . HYDROcodone-acetaminophen (NORCO/VICODIN) 5-325 MG tablet Take 1 tablet by mouth every 6 (six) hours as needed for moderate pain (pain score 4-6).    Colleen Can FE 1/20 1-20 MG-MCG tablet Take 1 tablet by mouth daily.    . meclizine (ANTIVERT) 25 MG tablet Take 1 tablet (25 mg total) by mouth 3 (three) times daily as needed for dizziness. 90 tablet 1  . methocarbamol (ROBAXIN) 750 MG tablet TAKE 1 TABLET BY MOUTH EVERY 8 HOURS AS NEEDED FOR MUSCLE SPASMS 270 tablet 0  . omeprazole (PRILOSEC) 40 MG capsule TAKE 1 CAPSULE BY MOUTH DAILY 90 capsule 0  . ondansetron (ZOFRAN-ODT) 8 MG disintegrating tablet Take 0.5 tablets (4 mg total) by mouth every 8 (eight) hours as needed for nausea or vomiting. 30 tablet 11  . Ubrogepant (UBRELVY) 100 MG TABS Take 1 tablet (100 mg) by mouth at onset of  migraine. May take an additional tablet in 2 hours if needed. Max 200  mg per day    . verapamil (CALAN-SR) 120 MG CR tablet TAKE 1 TABLET BY MOUTH AT BEDTIME 90 tablet 1  . etodolac (LODINE) 400 MG tablet Take 1 tablet (400 mg total) by mouth daily as needed. (Patient not taking: Reported on 10/03/2023) 30 tablet 0  . Fremanezumab-vfrm (AJOVY) 225 MG/1.5ML SOAJ Inject 225 mg into the skin every 30 (thirty) days. (Patient not taking: Reported on 10/03/2023) 1.5 mL 11  . ibuprofen (ADVIL) 800 MG tablet Take 1 tablet (800 mg total) by mouth 3 (three) times daily. (Patient not taking: Reported on 10/03/2023) 21 tablet 0  . mirtazapine (REMERON) 15 MG tablet Take 1 tablet (15 mg total) by mouth at bedtime. (Patient not taking: Reported on 10/03/2023) 30 tablet 0   No facility-administered medications prior to visit.    Allergies  Allergen Reactions  . Lamotrigine Hives and Swelling  . Abilify [Aripiprazole] Other (See Comments)    Weight gain, did not help,   . Azithromycin Rash  . Depakote [Divalproex Sodium] Other (See Comments)    Hair loss  . Erythromycin Base Swelling  . Lithium Rash  . Penicillins Rash    Has patient had a PCN reaction causing immediate rash, facial/tongue/throat swelling, SOB or lightheadedness with hypotension: Yes- swelling, rash  Has patient had a PCN reaction causing severe rash involving mucus membranes or skin necrosis: Yes Has patient had a PCN reaction that required hospitalization No Has patient had a PCN reaction occurring within the last 10 years: No If all of the above answers are "NO", then may proceed with Cephalosporin use.   . Pregabalin Palpitations    Manic symptoms, no sleep x's 3 days, no appetite.  . Seroquel [Quetiapine Fumarate] Other (See Comments)    Irritable  . Sulfa Antibiotics Rash  . Tetanus Toxoids Other (See Comments)    Swelling to injection site  . Tramadol Nausea And Vomiting  . Ziprasidone Hcl Other (See Comments)    Heat/cold  intolerance.     ROS: Pertinent symptoms negative unless otherwise noted in HPI      Objective:    Physical Exam Vitals reviewed.  Constitutional:      Appearance: Normal appearance.  HENT:     Nose:     Right Turbinates: Not swollen.     Left Turbinates: Not swollen.     Right Sinus: No maxillary sinus tenderness or frontal sinus tenderness.     Left Sinus: No maxillary sinus tenderness or frontal sinus tenderness.     Mouth/Throat:     Pharynx: Postnasal drip present.  Cardiovascular:     Rate and Rhythm: Normal rate and regular rhythm.  Pulmonary:     Effort: Pulmonary effort is normal.     Breath sounds: Normal breath sounds.  Neurological:     Mental Status: She is alert.      BP 122/78   Pulse 63   Temp 97.9 F (36.6 C) (Temporal)   Ht 5\' 9"  (1.753 m)   Wt 151 lb (68.5 kg)   SpO2 99%   BMI 22.30 kg/m  Wt Readings from Last 3 Encounters:  10/03/23 151 lb (68.5 kg)  06/24/23 158 lb 3.2 oz (71.8 kg)  03/15/23 164 lb (74.4 kg)     Health Maintenance Due  Topic Date Due  . Medicare Annual Wellness (AWV)  Never done  . Hepatitis C Screening  Never done  . Zoster Vaccines- Shingrix (1 of 2) Never done  . Cervical Cancer Screening (HPV/Pap Cotest)  08/06/2022    There are no preventive care reminders to display for this patient.  Lab Results  Component Value Date   TSH 3.799 03/13/2023   Lab Results  Component Value Date   WBC 9.0 03/12/2023   HGB 12.6 03/12/2023   HCT 38.3 03/12/2023   MCV 87.6 03/12/2023   PLT 283 03/12/2023   Lab Results  Component Value Date   NA 141 03/12/2023   K 3.9 03/12/2023   CO2 24 03/12/2023   GLUCOSE 98 03/12/2023   BUN 13 03/12/2023   CREATININE 1.04 (H) 03/12/2023   BILITOT 0.6 03/13/2023   ALKPHOS 41 03/13/2023   AST 16 03/13/2023   ALT 12 03/13/2023   PROT 6.4 (L) 03/13/2023   ALBUMIN 3.5 03/13/2023   CALCIUM 8.4 (L) 03/12/2023   ANIONGAP 9 03/12/2023   EGFR 57 (L) 12/10/2022   GFR 68.49  05/09/2017   Lab Results  Component Value Date   CHOL 312 (H) 12/19/2022   Lab Results  Component Value Date   HDL 56 12/19/2022   Lab Results  Component Value Date   LDLCALC 188 (H) 12/19/2022   Lab Results  Component Value Date   TRIG 341 (H) 12/19/2022   Lab Results  Component Value Date   CHOLHDL 5.6 12/19/2022   Lab Results  Component Value Date   HGBA1C 5.4 12/19/2022      Assessment & Plan:   Bipolar affective disorder, current episode hypomanic (HCC) Assessment & Plan: Continue current medications and follow-up with psychiatry as scheduled   Primary insomnia Assessment & Plan: Has tried multiple meds in past states too many s/e  Will order sleep study to r/o osa  Orders: -     Ambulatory referral to Sleep Studies  Dyslipidemia Assessment & Plan: In medication therapy recommendations.  Not open to any medication for statin control despite risks discussed to include heart disease/MI and or stroke.  Strict low cholesterol diet to follow Will order lipid panel and LPA next visit.  Could consider repatha and or prauluent in the future.       Essential hypertension Assessment & Plan: Stable without medication at current Will continue to monitor.    Chronic migraine without aura without status migrainosus, not intractable Assessment & Plan: Cont meds as prescribed and f/u with neurology as scheduled.   POTS (postural orthostatic tachycardia syndrome) Assessment & Plan: Cardiology prn  Neurology as scheduled   Osteopenia of spine Assessment & Plan: Discussed when needing mammogram can complete dexa scan as well.       No orders of the defined types were placed in this encounter.   Follow-up: Return for f/u CPE.    Felicita Horns, FNP

## 2023-10-03 NOTE — Assessment & Plan Note (Signed)
 Discussed when needing mammogram can complete dexa scan as well.

## 2023-10-03 NOTE — Assessment & Plan Note (Signed)
 Cardiology prn  Neurology as scheduled

## 2023-10-03 NOTE — Assessment & Plan Note (Signed)
 Continue current medications and follow-up with psychiatry as scheduled

## 2023-10-03 NOTE — Assessment & Plan Note (Signed)
 Cont meds as prescribed and f/u with neurology as scheduled.

## 2023-10-03 NOTE — Assessment & Plan Note (Signed)
 Has tried multiple meds in past states too many s/e  Will order sleep study to r/o osa

## 2023-10-03 NOTE — Assessment & Plan Note (Signed)
 Stable without medication at current Will continue to monitor.

## 2023-10-03 NOTE — Assessment & Plan Note (Signed)
 In medication therapy recommendations.  Not open to any medication for statin control despite risks discussed to include heart disease/MI and or stroke.  Strict low cholesterol diet to follow Will order lipid panel and LPA next visit.  Could consider repatha and or prauluent in the future.

## 2023-10-10 ENCOUNTER — Other Ambulatory Visit: Payer: Self-pay | Admitting: Internal Medicine

## 2023-10-11 ENCOUNTER — Encounter: Payer: Self-pay | Admitting: Family

## 2023-10-12 NOTE — Telephone Encounter (Signed)
 Can we ask her the questions in regards to the muscle relaxer she didn't respond to my message below

## 2023-10-13 NOTE — Telephone Encounter (Signed)
 Can you check out Q to ask pt below  Pharmacy asking for f/u

## 2023-10-14 ENCOUNTER — Telehealth: Payer: Self-pay | Admitting: Family

## 2023-10-14 DIAGNOSIS — F5101 Primary insomnia: Secondary | ICD-10-CM

## 2023-10-14 NOTE — Addendum Note (Signed)
 Addended by: Felicita Horns on: 10/14/2023 10:42 AM   Modules accepted: Orders

## 2023-10-14 NOTE — Telephone Encounter (Signed)
 LM for pt to returncall

## 2023-10-14 NOTE — Telephone Encounter (Signed)
 Copied from CRM [id number]. Topic: Referral - Question >> Oct 14, 2023 10:05 AM Elita Guitar wrote: Reason for CRM: patient called and stated that [facility] placed an order for her to have a sleep study at home. However, she spoke to her insurance and believes It may be $200-300 cheaper to do it at an outpatient facility vs at home. Please call and advise.

## 2023-10-14 NOTE — Telephone Encounter (Signed)
 Let her know that we can place a pulmonary referral to get this set up.   Please let us  know if you have not heard back within 2 weeks about the referral.

## 2023-10-17 NOTE — Telephone Encounter (Signed)
 LM for pt to returncall

## 2023-10-18 NOTE — Telephone Encounter (Signed)
 LM for pt to returncall

## 2023-10-20 ENCOUNTER — Encounter: Payer: Self-pay | Admitting: Sleep Medicine

## 2023-10-20 ENCOUNTER — Ambulatory Visit: Admitting: Sleep Medicine

## 2023-10-20 VITALS — BP 108/60 | HR 81 | Temp 97.1°F | Ht 69.0 in | Wt 152.6 lb

## 2023-10-20 DIAGNOSIS — F5104 Psychophysiologic insomnia: Secondary | ICD-10-CM

## 2023-10-20 DIAGNOSIS — R5383 Other fatigue: Secondary | ICD-10-CM

## 2023-10-20 DIAGNOSIS — R0683 Snoring: Secondary | ICD-10-CM

## 2023-10-20 DIAGNOSIS — G4700 Insomnia, unspecified: Secondary | ICD-10-CM

## 2023-10-20 DIAGNOSIS — G4733 Obstructive sleep apnea (adult) (pediatric): Secondary | ICD-10-CM

## 2023-10-20 NOTE — Progress Notes (Signed)
 Name:Melissa [name] MRN: [id number] DOB: 11-May-1971   CHIEF COMPLAINT:  EXCESSIVE DAYTIME SLEEPINESS   HISTORY OF PRESENT ILLNESS:  Melissa [name] is a 53 y.o. w/ a h/o migraine headaches, fibromyalgia, HTN, anxiety and depression who presents for c/o loud snoring and fatigue which has been present for several years. Reports nocturnal awakenings due to nocturia or unclear reasons, and has difficulty falling back to sleep. Denies any significant weight changes. Admits to dry mouth. Denies morning headaches, RLS symptoms, dream enactment, cataplexy, hypnagogic or hypnapompic hallucinations. Denies a family history of sleep apnea. Denies drowsy driving. Denies caffeine use. Denies alcohol, tobacco or illicit drug use.   Bedtime 8-10 pm Sleep onset 1 hour Rise time 7 am   EPWORTH SLEEP SCORE 1    10/20/2023    9:00 AM  Results of the Epworth flowsheet  Sitting and reading 0  Watching TV 0  Sitting, inactive in a public place (e.g. a theatre or a meeting) 0  As a passenger in a car for an hour without a break 0  Lying down to rest in the afternoon when circumstances permit 1  Sitting and talking to someone 0  Sitting quietly after a lunch without alcohol 0  In a car, while stopped for a few minutes in traffic 0  Total score 1      PAST MEDICAL HISTORY :   has a past medical history of Arthritis, Bipolar disorder (HCC), Fibromyalgia, Migraine, Muscle spasm, Pain of right side of body, Panic attack, POTS (postural orthostatic tachycardia syndrome), Syncope, and Uterine perforation.  has a past surgical history that includes Dilation and curettage of uterus; Cataract extraction (dec 2014); and LASIK (dec 2014). Prior to Admission medications   Medication Sig Start Date End Date Taking? Authorizing Provider  acetaminophen  (TYLENOL ) 500 MG tablet Take 1,000 mg by mouth daily as needed for mild pain.   Yes [provider]  buPROPion  (WELLBUTRIN  XL) 150 MG 24 hr tablet Take 1  tablet (150 mg total) by mouth daily. 07/17/19  Yes Aron, Talia M, MD  clonazePAM  (KLONOPIN ) 1 MG tablet Take 1 tablet (1 mg total) by mouth 2 (two) times daily as needed. for anxiety 05/26/16  Yes Aron, Talia M, MD  fexofenadine (ALLEGRA) 180 MG tablet Take 180 mg by mouth daily.   Yes [provider]  fluvoxaMINE  (LUVOX ) 50 MG tablet Take 50-100 mg by mouth at bedtime.  05/23/17  Yes [provider]  gabapentin  (NEURONTIN ) 600 MG tablet TAKE 1 TABLET BY MOUTH 3 TIMES DAILY 02/18/23  Yes Baity, Rankin Buzzard, NP  HYDROcodone -acetaminophen  (NORCO/VICODIN) 5-325 MG tablet Take 1 tablet by mouth every 6 (six) hours as needed for moderate pain (pain score 4-6).   Yes [provider]  JUNEL  FE 1/20 1-20 MG-MCG tablet Take 1 tablet by mouth daily. 11/25/22  Yes [provider]  meclizine  (ANTIVERT ) 25 MG tablet Take 1 tablet (25 mg total) by mouth 3 (three) times daily as needed for dizziness. 07/21/23  Yes Baity, Rankin Buzzard, NP  methocarbamol  (ROBAXIN ) 750 MG tablet Take 1 tablet (750 mg total) by mouth every 8 (eight) hours as needed for muscle spasms. 10/14/23  Yes Felicita Horns, FNP  omeprazole  (PRILOSEC) 40 MG capsule TAKE 1 CAPSULE BY MOUTH DAILY 08/19/23  Yes Carollynn Cirri, NP  ondansetron  (ZOFRAN -ODT) 8 MG disintegrating tablet Take 0.5 tablets (4 mg total) by mouth every 8 (eight) hours as needed for nausea or vomiting. 08/18/21  Yes Tresia Fruit,  Demetra Filter, MD  Ubrogepant  (UBRELVY ) 100 MG TABS Take 1 tablet (100 mg) by mouth at onset of migraine. May take an additional tablet in 2 hours if needed. Max 200 mg per day 03/10/23  Yes Millikan, Megan, NP  verapamil  (CALAN -SR) 120 MG CR tablet TAKE 1 TABLET BY MOUTH AT BEDTIME 06/24/23  Yes Baity, Rankin Buzzard, NP   Allergies  Allergen Reactions   Lamotrigine Hives and Swelling   Abilify [Aripiprazole] Other (See Comments)    Weight gain, did not help,    Azithromycin Rash   Depakote [Divalproex Sodium] Other (See Comments)    Hair loss    Erythromycin Base Swelling   Lithium Rash   Penicillins Rash    Has patient had a PCN reaction causing immediate rash, facial/tongue/throat swelling, SOB or lightheadedness with hypotension: Yes- swelling, rash  Has patient had a PCN reaction causing severe rash involving mucus membranes or skin necrosis: Yes Has patient had a PCN reaction that required hospitalization No Has patient had a PCN reaction occurring within the last 10 years: No If all of the above answers are "NO", then may proceed with Cephalosporin use.    Pregabalin Palpitations    Manic symptoms, no sleep x's 3 days, no appetite.   Seroquel [Quetiapine Fumarate] Other (See Comments)    Irritable   Sulfa Antibiotics Rash   Tetanus Toxoids Other (See Comments)    Swelling to injection site   Ziprasidone Hcl Other (See Comments)    Heat/cold intolerance.     FAMILY HISTORY:  family history includes Brain cancer in her father; Cancer in her paternal grandfather and paternal grandmother; Diabetes in her mother; Healthy in her sister; Hypertension in her mother; Lung cancer in her maternal grandfather; Ovarian cancer in her maternal grandmother; Stroke in her mother. SOCIAL HISTORY:  reports that she has never smoked. She has never used smokeless tobacco. She reports that she does not drink alcohol and does not use drugs.   Review of Systems:  Gen:  Denies  fever, sweats, chills weight loss  HEENT: Denies blurred vision, double vision, ear pain, eye pain, hearing loss, nose bleeds, sore throat Cardiac:  No dizziness, chest pain or heaviness, chest tightness,edema, No JVD Resp:   No cough, -sputum production, -shortness of breath,-wheezing, -hemoptysis,  Gi: Denies swallowing difficulty, stomach pain, nausea or vomiting, diarrhea, constipation, bowel incontinence Gu:  Denies bladder incontinence, burning urine Ext:   Denies Joint pain, stiffness or swelling Skin: Denies  skin rash, easy bruising or bleeding or  hives Endoc:  Denies polyuria, polydipsia , polyphagia or weight change Psych:   Denies depression, insomnia or hallucinations  Other:  All other systems negative  VITAL SIGNS: BP 108/60 (BP Location: Right Arm, Cuff Size: Normal)   Pulse 81   Temp (!) 97.1 F (36.2 C)   Ht 5\' 9"  (1.753 m)   Wt 152 lb 9.6 oz (69.2 kg)   SpO2 97%   BMI 22.54 kg/m    Physical Examination:   General Appearance: No distress  EYES PERRLA, EOM intact.   NECK Supple, No JVD Pulmonary: normal breath sounds, No wheezing.  CardiovascularNormal S1,S2.  No m/r/g.   Abdomen: Benign, Soft, non-tender. Skin:   warm, no rashes, no ecchymosis  Extremities: normal, no cyanosis, clubbing. Neuro:without focal findings,  speech normal  PSYCHIATRIC: Mood, affect within normal limits.   ASSESSMENT AND PLAN  OSA I suspect that OSA is likely present due to clinical presentation. Discussed the consequences of untreated sleep apnea. Advised  not to drive drowsy for safety of patient and others. Will complete further evaluation with a home sleep study and follow up to review results.    Insomnia Counseled patient on stimulus control and improving sleep hygiene practices.     MEDICATION ADJUSTMENTS/LABS AND TESTS ORDERED: Recommend Sleep Study   Patient  satisfied with Plan of action and management. All questions answered  Follow up to review HST results and treatment plan.   I spent a total of 49 minutes reviewing chart data, face-to-face evaluation with the patient, counseling and coordination of care as detailed above.    Aneth Schlagel, M.D.  Sleep Medicine [facility]

## 2023-10-20 NOTE — Patient Instructions (Signed)
 Melissa [name]

## 2023-10-24 ENCOUNTER — Ambulatory Visit: Payer: Medicare Other | Admitting: Neurology

## 2023-10-24 ENCOUNTER — Encounter: Payer: Self-pay | Admitting: Neurology

## 2023-10-24 VITALS — BP 114/64 | HR 120 | Ht 69.0 in | Wt 152.6 lb

## 2023-10-24 DIAGNOSIS — G43709 Chronic migraine without aura, not intractable, without status migrainosus: Secondary | ICD-10-CM

## 2023-10-24 DIAGNOSIS — R55 Syncope and collapse: Secondary | ICD-10-CM | POA: Diagnosis not present

## 2023-10-24 DIAGNOSIS — R42 Dizziness and giddiness: Secondary | ICD-10-CM | POA: Diagnosis not present

## 2023-10-24 DIAGNOSIS — G90A Postural orthostatic tachycardia syndrome (POTS): Secondary | ICD-10-CM

## 2023-10-24 NOTE — Patient Instructions (Addendum)
 Carotid ultrasound Continue ajovy /migraines Consider decreasing the verapamil 

## 2023-10-24 NOTE — Progress Notes (Unsigned)
 [facility]    Provider:  Dr Tresia Fruit Requesting Provider: Carollynn Cirri, NP Primary Care Provider:  Felicita Horns, FNP  CC:  Migraine  10/24/2023: Last seen by [name] 03/10/2023 and doing well on Ajovy . Patient has POTs and has been lightheaded for a long time. Orthostatics today in the office. More lightheaded than dizzy.  In fact patient was in the emergency room in September 2025 for the same, dizziness, fatigue, chest tightness, hair falling out and generalized weakness, workup including blood work, EKG, chest x-ray head CT was normal she was treated with IV fluids and improved.  Considering  normal neurologic exam, CT of the head without etiology and longstanding symptoms with improvement with IV fluids this is less likely a primary neurologic etiology. She reports fatigue and lightheadedness. She feels episodes of pre-syncope, she is going to pulmonology who has ordered a sleep study, has it all the time sitting or standing worse but laying down is better, she feels like that right now, it just "hits" her and she has been feeling like this since Saturday night and felt like she couldn't breath she may be dehydrated because her pulse went up to 120 she tries to hydrate well all day long so doesn;t think she is dehydrated drinks at least 64-70 ounces a day and powerade/gatorade. Shortness of breath even back to 2017 (see [facility] Angio chest for the same) and she endorses this has been ongoing for years and saw Dr. Rodolfo Clan for POTS in the past. Has had extensive workup. Multiple MRIs of the brain and CT head, no syncopal episodes for 10 years. IV fluids are very helpful. She was put on Verapamil  for mood and pulse possibky consider decreasing verapamil  by primary care but unclear how it will affect pulse, will check carotids.   Reviewed notes, labs and imaging from outside physicians, which showed:  03/13/2023: TSH nml  03/13/2023: CLINICAL DATA:  Headache, increasing frequency  or severity   EXAM: CT HEAD WITHOUT CONTRAST   TECHNIQUE: Contiguous axial images were obtained from the base of the skull through the vertex without intravenous contrast.   RADIATION DOSE REDUCTION: This exam was performed according to the departmental dose-optimization program which includes automated exposure control, adjustment of the mA and/or kV according to patient size and/or use of iterative reconstruction technique.   COMPARISON:  12/19/2022   FINDINGS: Brain: No evidence of acute infarction, hemorrhage, mass, mass effect, or midline shift. No hydrocephalus or extra-axial fluid collection.   Vascular: No hyperdense vessel.   Skull: Negative for fracture or focal lesion. Hyperostosis frontalis.   Sinuses/Orbits: No acute finding.   Other: The mastoid air cells are well aerated.   IMPRESSION: No acute intracranial process.  Patient complains of symptoms per HPI as well as the following symptoms: dizziness . Pertinent negatives and positives per HPI. All others negative  August 17, 2021: Patient here for follow-up of migraines, was doing great on Ajovy  6 months ago, acutely using Maxalt  and baclofen  or Flexeril .  She did great on Emgality  but she could not afford it so we gave her Ajovy  samples at last appointment, here for follow-up. She is having 5-6 migraine days a month. Tried Emgality  and Ajovy . A total of 5 migraine days a month and 5 total headache days a month. Her baseline migraine frequency was Lots of stress. She takes amerge and ondansetron  acutely and helps a lot, may still last for hours but much better. We will try to prescribe the Ajovy  and see  if insurance will pay for it. At baseline she had 12 migraine days a month and > 15 headache days a month and has done tremendously well on Ajovy .   Patient complains of symptoms per HPI as well as the following symptoms: stress . Pertinent negatives and positives per HPI. All others negative   02/16/2021: DOing  great on Ajovy . Only 6 migraines in 6 months. Takes maxalt  and baclofen  acutely or flexeril , but when she gets a migraine it is hard to get rid of, the last one was 3 days, even though frequency is greatly improved acute management is still a problem. She did well on Emgality  bu it is $85 and she can't afford it but Ajovy  is better. She had covid in the meantime. We will give her 6 months of samples of Ajovy . She only had one migraine, we discussed aimovig but she is having hair loss due to covid so gave ajovy .     Acutely: Tried maxalt , zolmitriptan , ondansetron  helps significantly for the nausea, sumatriptan ,   HPI:  Melissa [name] is a 53 y.o. female here as requested by Carollynn Cirri, NP for migraines. PMHx POTS, panic attack, migraine, fibromyalgia, bipolar, arthritis, HTN, IBS, depression, B12 deficiency, tremor, anxiety. She is having stress with her mother who is 36 and refuses to leave her home, so there is stress affecting. Been doing fine, restarted 3-4 months ago in the setting of stress, unilateral, mostly sound sensitivity and mild light sensitivity, movement makes it worse, nausea, no vomiting, getting a migraine every week now, can last 2-3 days, up to 12 migraine days a month for the last 3 months and pulsating/pounding, starts in the left in the back and radiates to the front, can be severe, a quiet room helps, OTC has not helped, hydrocodone  has not helped significantly, had to go to the walk-in clinic recently, can affect her life and work. These are similar to prior migraines, vision is affected with the severe migraines, no new sensory changes or new vision changes, same quality and severity just more frequent, they can happen any time of the day. Not positional or exertional or anything of concern ut is the same exact migraine as always. No other focal neurologic deficits, associated symptoms, inciting events or modifiable factors.  Reviewed notes, labs and imaging from outside  physicians, which showed:  Cbc/bmp unremarkable 06/26/2019  CT head 08/2018 showed No acute intracranial abnormalities including mass lesion or mass effect, hydrocephalus, extra-axial fluid collection, midline shift, hemorrhage, or acute infarction, large ischemic events (personally reviewed images)  Medications that patient has use that can be used in migraine management include: Tylenol , Elavil , Wellbutrin , Tegretol , Flexeril , diclofenac  tablet, Benadryl , Depakote, Prozac, gabapentin , Toradol  injections, meclizine , Robaxin , Reglan  injections, Zofran  injections and tablets, prednisone  tablets, Phenergan  tablets and suppositories, Maxalt , Imitrex  tablets and injections, tizanidine , tramadol , Effexor , verapamil , topamax , propranolol contraindicated due to POTS.    Review of Systems: Patient complains of symptoms per HPI as well as the following symptoms: hnausea. Pertinent negatives and positives per HPI. All others negative    Social History   Socioeconomic History   Marital status: Married    Spouse name: Cindie Credit   Number of children: 0   Years of education: some college   Highest education level: Not on file  Occupational History    Employer: OTHER    Comment: disabled  Tobacco Use   Smoking status: Never   Smokeless tobacco: Never  Vaping Use   Vaping status: Never Used  Substance and  Sexual Activity   Alcohol use: No    Alcohol/week: 0.0 standard drinks of alcohol   Drug use: No   Sexual activity: Yes    Birth control/protection: Other-see comments    Comment: also on OCPs, husband had vasectomy  Other Topics Concern   Not on file  Social History Narrative   From: [place] , moved because her husband was from [place]   Living: with husband Cindie Credit, since 1990   Work: disability due to fibromyalgia and bipolar disorder   Works part time 2 days per week in [employer]       Family:has a sister who lives in [place]       Enjoys: crochet, baking, reading      Exercise: walking 19,000  steps 2 days per week, yoga - tries to do 3 times a week   Diet: not great, more sweets than she should      Safety   Seat belts: Yes    Guns: Yes  and secure   Safe in relationships: Yes          Right handed   Caffeine: none    Social Drivers of Corporate investment banker Strain: Not on file  Food Insecurity: No Food Insecurity (12/19/2022)   Hunger Vital Sign    Worried About Running Out of Food in the Last Year: Never true    Ran Out of Food in the Last Year: Never true  Transportation Needs: No Transportation Needs (12/19/2022)   PRAPARE - Administrator, Civil Service (Medical): No    Lack of Transportation (Non-Medical): No  Physical Activity: Not on file  Stress: Not on file  Social Connections: Unknown (02/01/2023)   Received from [facility]   Social Network    Social Network: Not on file  Intimate Partner Violence: Unknown (02/01/2023)   Received from [facility]   HITS    Physically Hurt: Not on file    Insult or Talk Down To: Not on file    Threaten Physical Harm: Not on file    Scream or Curse: Not on file    Family History  Problem Relation Age of Onset   Stroke Mother    Diabetes Mother    Hypertension Mother    Brain cancer Father    Healthy Sister    Ovarian cancer Maternal Grandmother        metastasized   Lung cancer Maternal Grandfather        smoker   Cancer Paternal Grandmother        not sure of the type   Cancer Paternal Grandfather        not sure of the type   Migraines Neg Hx     Past Medical History:  Diagnosis Date   Arthritis    Bipolar disorder (HCC)    Fibromyalgia    on disability   Migraine    Muscle spasm    Pain of right side of body    chronic   Panic attack    POTS (postural orthostatic tachycardia syndrome)    Syncope    recurrent   Uterine perforation    hx of    Patient Active Problem List   Diagnosis Date Noted   Insomnia 06/24/2023   Chronic migraine without aura without status  migrainosus, not intractable 02/11/2020   Anxiety 10/23/2019   Essential hypertension 05/09/2017   IBS (irritable bowel syndrome) 09/20/2013   Dyslipidemia 04/03/2013   Osteopenia 04/03/2013   POTS (postural orthostatic  tachycardia syndrome) 02/26/2013   Fibromyalgia    Bipolar disorder (HCC)    Gastroesophageal reflux disease without esophagitis 09/10/2011    Past Surgical History:  Procedure Laterality Date   CATARACT EXTRACTION  dec 2014   DILATION AND CURETTAGE OF UTERUS     LASIK  dec 2014    Current Outpatient Medications  Medication Sig Dispense Refill   acetaminophen  (TYLENOL ) 500 MG tablet Take 1,000 mg by mouth daily as needed for mild pain.     buPROPion  (WELLBUTRIN  XL) 150 MG 24 hr tablet Take 1 tablet (150 mg total) by mouth daily. 90 tablet 3   clonazePAM  (KLONOPIN ) 1 MG tablet Take 1 tablet (1 mg total) by mouth 2 (two) times daily as needed. for anxiety 60 tablet 0   fexofenadine (ALLEGRA) 180 MG tablet Take 180 mg by mouth daily.     fluvoxaMINE  (LUVOX ) 50 MG tablet Take 50-100 mg by mouth at bedtime.      gabapentin  (NEURONTIN ) 600 MG tablet TAKE 1 TABLET BY MOUTH 3 TIMES DAILY 270 tablet 1   HYDROcodone -acetaminophen  (NORCO/VICODIN) 5-325 MG tablet Take 1 tablet by mouth every 6 (six) hours as needed for moderate pain (pain score 4-6).     JUNEL  FE 1/20 1-20 MG-MCG tablet Take 1 tablet by mouth daily.     meclizine  (ANTIVERT ) 25 MG tablet Take 1 tablet (25 mg total) by mouth 3 (three) times daily as needed for dizziness. 90 tablet 1   methocarbamol  (ROBAXIN ) 750 MG tablet Take 1 tablet (750 mg total) by mouth every 8 (eight) hours as needed for muscle spasms. 20 tablet 0   omeprazole  (PRILOSEC) 40 MG capsule TAKE 1 CAPSULE BY MOUTH DAILY 90 capsule 0   ondansetron  (ZOFRAN -ODT) 8 MG disintegrating tablet Take 0.5 tablets (4 mg total) by mouth every 8 (eight) hours as needed for nausea or vomiting. 30 tablet 11   Ubrogepant  (UBRELVY ) 100 MG TABS Take 1 tablet (100  mg) by mouth at onset of migraine. May take an additional tablet in 2 hours if needed. Max 200 mg per day     verapamil  (CALAN -SR) 120 MG CR tablet TAKE 1 TABLET BY MOUTH AT BEDTIME 90 tablet 1   No current facility-administered medications for this visit.    Allergies as of 10/24/2023 - Review Complete 10/24/2023  Allergen Reaction Noted   Lamotrigine Hives and Swelling 06/03/2015   Abilify [aripiprazole] Other (See Comments) 07/17/2015   Azithromycin Rash 09/12/2012   Depakote [divalproex sodium] Other (See Comments) 11/28/2012   Erythromycin base Swelling 01/03/2015   Lithium Rash 11/28/2012   Penicillins Rash 09/12/2012   Pregabalin Palpitations 11/23/2013   Seroquel [quetiapine fumarate] Other (See Comments) 11/28/2012   Sulfa antibiotics Rash 09/12/2012   Tetanus toxoids Other (See Comments) 06/03/2015   Ziprasidone hcl Other (See Comments) 01/03/2015    Vitals: BP 114/64 (BP Location: Right Arm, Cuff Size: Normal)   Pulse (!) 120   Ht 5\' 9"  (1.753 m)   Wt 152 lb 9.6 oz (69.2 kg)   BMI 22.54 kg/m  Last Weight:  Wt Readings from Last 1 Encounters:  10/24/23 152 lb 9.6 oz (69.2 kg)   Last Height:   Ht Readings from Last 1 Encounters:  10/24/23 5\' 9"  (1.753 m)   Physical exam: Exam: Gen: NAD, conversant      CV: No palpitations or chest pain or SOB. VS: Breathing at a normal rate. Weight appears within normal limits. Not febrile. Eyes: Conjunctivae clear without exudates or hemorrhage  Neuro: Detailed  Neurologic Exam  Speech:    Speech is normal; fluent and spontaneous with normal comprehension.  Cognition:    The patient is oriented to person, place, and time;     recent and remote memory intact;     language fluent;     normal attention, concentration, fund of knowledge Cranial Nerves:    The pupils are equal, round, and reactive to light. Visual fields are full Extraocular movements are intact.  The face is symmetric with normal sensation. The palate  elevates in the midline. Hearing intact. Voice is normal. Shoulder shrug is normal. The tongue has normal motion without fasciculations.   Coordination: normal  Gait:    No abnormalities noted or reported  Motor Observation:   no involuntary movements noted. Tone:    Appears normal  Posture:    Posture is normal. normal erect    Strength:    Strength is anti-gravity and symmetric in the upper and lower limbs.      Sensation: intact to LT, no reports of numbness or tingling or paresthesias         Assessment/Plan:  53 year old with chronic migraines doing well on Ajovy . But has a hx of chronic lightheadedness, has POTS so may be related and she has seen Dr. Rodolfo Clan in cardiology in the past.  Orthostatics in the office today did not show orthostatic hypotension, she reports that she is more lightheaded and dizzy, it has been ongoing at least since 2018, she was in the emergency room last September 2024 for the same including dizziness, fatigue, generalized weakness, workups have been unrevealing and IV fluids help are improved.  Normal neurologic exam today, CT of the head without etiology and longstanding symptoms with improvement with IV fluids is less likely a primary neurologic etiology however we will order ultrasound carotids just to ensure no vascular etiology.  Carotid ultrasound Migraines: continue Ajovy , migraines are well controlled  Chronic lightheadedness and pre-syncope for years(even back to 2017) states she feels this all the time even currently while sitting here in the office. May consider reducing verapamil , not sure what that may due to her pulse but may help with dizziness especially since her blood pressure can sometimes be low (states was placed on it for elevated blood pressure and elevated pulse) but contacted primary care who prescribes verapamil  to consider and discuss  Has had multiple extensive imaging, but last carotid US  was in 2013 will repeat (but multiple  CTs head, MRI brain and other imaging extensively)  Orders Placed This Encounter  Procedures   VAS US  CAROTID   Followup with [name] NP Continue Ajovy , discussed their foundation and gave her instructions on how to apply At inset: Zofran , amerge: Please take one tablet at the onset of your headache. If it does not improve the symptoms please take one additional tablet. Do not take more then 2 tablets in 24hrs. Do not take use more then 2 to 3 times in a week. May also take muscle relaxer and ibuprofen /tylenol  at onset  She did ok on Emgality  but it is $85 and she can't afford it not as effective, Ajovy  much better. She had covid in the meantime. Aimovig contraindicated due to constipation. Tried Topramate(side effects confusion), propranolol(side effects hypotension), amitriptyline /nortriptyline(sedaton), depakote, imitrex , relpax , can't take blood pressure mes due to POTS ad already on verapamil  (Prior Medications that patient has use that can be used in migraine management include: Tylenol , Elavil , Wellbutrin , Tegretol , Flexeril , diclofenac  tablet, Benadryl , Depakote, Prozac, gabapentin , Toradol   injections, meclizine , Robaxin , Reglan  injections, Zofran  injections and tablets, prednisone  tablets, Phenergan  tablets and suppositories, Maxalt , Imitrex  tablets and injections, tizanidine , tramadol , Effexor , verapamil , topamax , propranolol contraindicated due to POTS.)   To prevent or relieve headaches, try the following: Cool Compress. Lie down and place a cool compress on your head.  Avoid headache triggers. If certain foods or odors seem to have triggered your migraines in the past, avoid them. A headache diary might help you identify triggers.  Include physical activity in your daily routine. Try a daily walk or other moderate aerobic exercise.  Manage stress. Find healthy ways to cope with the stressors, such as delegating tasks on your to-do list.  Practice relaxation techniques. Try deep  breathing, yoga, massage and visualization.  Eat regularly. Eating regularly scheduled meals and maintaining a healthy diet might help prevent headaches. Also, drink plenty of fluids.  Follow a regular sleep schedule. Sleep deprivation might contribute to headaches Consider biofeedback. With this mind-body technique, you learn to control certain bodily functions -- such as muscle tension, heart rate and blood pressure -- to prevent headaches or reduce headache pain.    Proceed to emergency room if you experience new or worsening symptoms or symptoms do not resolve, if you have new neurologic symptoms or if headache is severe, or for any concerning symptom.    No orders of the defined types were placed in this encounter.    Cc: Carollynn Cirri, NP,  Felicita Horns, FNP   I spent over 40 minutes of face-to-face and non-face-to-face time with patient on the  1. Chronic migraine without aura without status migrainosus, not intractable   2. Pre-syncope   3. Dizziness   4. POTS (postural orthostatic tachycardia syndrome)      diagnosis.  This included previsit chart review, lab review, study review, order entry, electronic health record documentation, patient education on the different diagnostic and therapeutic options, counseling and coordination of care, risks and benefits of management, compliance, or risk factor reduction    Aldona Amel, MD  [facility] [address] [place], [place] [address]  Phone [phone] Fax [phone]

## 2023-10-26 ENCOUNTER — Encounter: Payer: Self-pay | Admitting: Family

## 2023-11-11 ENCOUNTER — Ambulatory Visit
Admission: EM | Admit: 2023-11-11 | Discharge: 2023-11-11 | Disposition: A | Discharge status: Home / Self Care | Attending: Nurse Practitioner | Admitting: Nurse Practitioner

## 2023-11-11 ENCOUNTER — Other Ambulatory Visit: Payer: Self-pay

## 2023-11-11 DIAGNOSIS — R55 Syncope and collapse: Secondary | ICD-10-CM

## 2023-11-11 DIAGNOSIS — G90A Postural orthostatic tachycardia syndrome (POTS): Secondary | ICD-10-CM | POA: Diagnosis not present

## 2023-11-11 LAB — POCT FASTING CBG KUC MANUAL ENTRY: POCT Glucose (KUC): 110 mg/dL — AB (ref 70–99)

## 2023-11-11 LAB — POC SARS CORONAVIRUS 2 AG -  ED: SARS Coronavirus 2 Ag: NEGATIVE

## 2023-11-11 NOTE — Discharge Instructions (Signed)
 Go to the emergency department for further evaluation.

## 2023-11-11 NOTE — ED Notes (Signed)
 Patient is being discharged from the Urgent Care and sent to the Emergency Department via POV. Per Chrisite Leath-Warren, patient is in need of higher level of care due to Near Syncope. Patient is aware and verbalizes understanding of plan of care.  Vitals:   11/11/23 1215  BP: 127/87  Pulse: 92  Resp: 14  Temp: 97.8 F (36.6 C)  SpO2: 98%

## 2023-11-11 NOTE — ED Triage Notes (Signed)
 Pt reports lightheaded, dizziness, and shaky x 2 hours.

## 2023-11-11 NOTE — ED Notes (Signed)
 Pt reports lightheaded, dizziness, and shaky x 2 hours.

## 2023-11-11 NOTE — ED Provider Notes (Signed)
 [facility]    CSN: [id number] Arrival date & time: 11/11/23  1211      History   Chief Complaint No chief complaint on file.   HPI Melissa [name] is a 53 y.o. female.   The history is provided by the patient.   Patient presents for sudden onset of lightheadedness, dizziness, and shaking.  Patient states symptoms started approximately 2 hours ago.  Patient states I feel like someone will feel "right before they are going to pass out."  Patient reports underlying history of POTS.  States that she was concerned that her blood pressure may be low.  Patient denies chest pain, shortness of breath, difficulty breathing, headache, blurred vision, light sensitivity, abdominal pain, nausea, vomiting, diarrhea, or rash.  Patient states her last episode that occurred similar to this 1 was several months ago.  She states that she has been hospitalized, and has seen several specialist, but no one can determine why she experiences these symptoms.  Patient states she is to be keeping a blood pressure log for her PCP but states that she may forget from time to time.  Past Medical History:  Diagnosis Date   Arthritis    Bipolar disorder (HCC)    Fibromyalgia    on disability   Migraine    Muscle spasm    Pain of right side of body    chronic   Panic attack    POTS (postural orthostatic tachycardia syndrome)    Syncope    recurrent   Uterine perforation    hx of    Patient Active Problem List   Diagnosis Date Noted   Insomnia 06/24/2023   Chronic migraine without aura without status migrainosus, not intractable 02/11/2020   Anxiety 10/23/2019   Essential hypertension 05/09/2017   IBS (irritable bowel syndrome) 09/20/2013   Dyslipidemia 04/03/2013   Osteopenia 04/03/2013   POTS (postural orthostatic tachycardia syndrome) 02/26/2013   Fibromyalgia    Bipolar disorder (HCC)    Gastroesophageal reflux disease without esophagitis 09/10/2011    Past Surgical History:   Procedure Laterality Date   CATARACT EXTRACTION  dec 2014   DILATION AND CURETTAGE OF UTERUS     LASIK  dec 2014    OB History   No obstetric history on file.      Home Medications    Prior to Admission medications   Medication Sig Start Date End Date Taking? Authorizing Provider  acetaminophen  (TYLENOL ) 500 MG tablet Take 1,000 mg by mouth daily as needed for mild pain.    [provider]  buPROPion  (WELLBUTRIN  XL) 150 MG 24 hr tablet Take 1 tablet (150 mg total) by mouth daily. 07/17/19   Aron, Talia M, MD  clonazePAM  (KLONOPIN ) 1 MG tablet Take 1 tablet (1 mg total) by mouth 2 (two) times daily as needed. for anxiety 05/26/16   Aron, Talia M, MD  fexofenadine (ALLEGRA) 180 MG tablet Take 180 mg by mouth daily.    [provider]  fluvoxaMINE  (LUVOX ) 50 MG tablet Take 50-100 mg by mouth at bedtime.  05/23/17   [provider]  gabapentin  (NEURONTIN ) 600 MG tablet TAKE 1 TABLET BY MOUTH 3 TIMES DAILY 02/18/23   Carollynn Cirri, NP  HYDROcodone -acetaminophen  (NORCO/VICODIN) 5-325 MG tablet Take 1 tablet by mouth every 6 (six) hours as needed for moderate pain (pain score 4-6).    [provider]  JUNEL  FE 1/20 1-20 MG-MCG tablet Take 1 tablet by mouth daily. 11/25/22   [provider]  meclizine  (ANTIVERT ) 25 MG tablet Take 1 tablet (25 mg total) by mouth 3 (three) times daily as needed for dizziness. 07/21/23   Carollynn Cirri, NP  methocarbamol  (ROBAXIN ) 750 MG tablet Take 1 tablet (750 mg total) by mouth every 8 (eight) hours as needed for muscle spasms. 10/14/23   Felicita Horns, FNP  omeprazole  (PRILOSEC) 40 MG capsule TAKE 1 CAPSULE BY MOUTH DAILY 08/19/23   Carollynn Cirri, NP  ondansetron  (ZOFRAN -ODT) 8 MG disintegrating tablet Take 0.5 tablets (4 mg total) by mouth every 8 (eight) hours as needed for nausea or vomiting. 08/18/21   Glory Larsen, MD  Ubrogepant  (UBRELVY ) 100 MG TABS Take 1 tablet (100 mg) by mouth at onset of migraine. May  take an additional tablet in 2 hours if needed. Max 200 mg per day 03/10/23   [name], NP  verapamil  (CALAN -SR) 120 MG CR tablet TAKE 1 TABLET BY MOUTH AT BEDTIME 06/24/23   Carollynn Cirri, NP    Family History Family History  Problem Relation Age of Onset   Stroke Mother    Diabetes Mother    Hypertension Mother    Brain cancer Father    Healthy Sister    Ovarian cancer Maternal Grandmother        metastasized   Lung cancer Maternal Grandfather        smoker   Cancer Paternal Grandmother        not sure of the type   Cancer Paternal Grandfather        not sure of the type   Migraines Neg Hx     Social History Social History   Tobacco Use   Smoking status: Never   Smokeless tobacco: Never  Vaping Use   Vaping status: Never Used  Substance Use Topics   Alcohol use: No    Alcohol/week: 0.0 standard drinks of alcohol   Drug use: No     Allergies   Lamotrigine, Abilify [aripiprazole], Azithromycin, Depakote [divalproex sodium], Erythromycin base, Lithium, Penicillins, Pregabalin, Seroquel [quetiapine fumarate], Sulfa antibiotics, Tetanus toxoids, and Ziprasidone hcl   Review of Systems Review of Systems Per HPI  Physical Exam Triage Vital Signs ED Triage Vitals  Encounter Vitals Group     BP 11/11/23 1215 127/87     Systolic BP Percentile --      Diastolic BP Percentile --      Pulse Rate 11/11/23 1215 92     Resp 11/11/23 1215 14     Temp 11/11/23 1215 97.8 F (36.6 C)     Temp Source 11/11/23 1215 Oral     SpO2 11/11/23 1215 98 %     Weight --      Height --      Head Circumference --      Peak Flow --      Pain Score 11/11/23 1218 0     Pain Loc --      Pain Education --      Exclude from Growth Chart --    Orthostatic VS for the past 24 hrs:  BP- Lying Pulse- Lying BP- Sitting Pulse- Sitting BP- Standing at 0 minutes Pulse- Standing at 0 minutes  11/11/23 1234 124/78 89 122/77 98 124/74 99    Updated Vital Signs BP 127/87 (BP Location:  Right Arm)   Pulse 92   Temp 97.8 F (36.6 C) (Oral)   Resp 14   SpO2 98%   Visual Acuity Right Eye Distance:   Left Eye  Distance:   Bilateral Distance:    Right Eye Near:   Left Eye Near:    Bilateral Near:     Physical Exam Vitals and nursing note reviewed.  Constitutional:      General: She is not in acute distress.    Appearance: Normal appearance.  HENT:     Head: Normocephalic.     Mouth/Throat:     Mouth: Mucous membranes are moist.  Eyes:     Extraocular Movements: Extraocular movements intact.     Conjunctiva/sclera: Conjunctivae normal.     Pupils: Pupils are equal, round, and reactive to light.  Cardiovascular:     Rate and Rhythm: Normal rate and regular rhythm.     Pulses: Normal pulses.     Heart sounds: Normal heart sounds.  Pulmonary:     Effort: Pulmonary effort is normal. No respiratory distress.     Breath sounds: Normal breath sounds. No stridor. No wheezing, rhonchi or rales.  Abdominal:     General: Bowel sounds are normal.     Palpations: Abdomen is soft.     Tenderness: There is no abdominal tenderness.  Musculoskeletal:     Cervical back: Normal range of motion.  Skin:    General: Skin is warm and dry.  Neurological:     General: No focal deficit present.     Mental Status: She is alert and oriented to person, place, and time.     GCS: GCS eye subscore is 4. GCS verbal subscore is 5. GCS motor subscore is 6.     Cranial Nerves: Cranial nerves 2-12 are intact.     Sensory: Sensation is intact.     Motor: Motor function is intact.     Coordination: Coordination is intact.     Gait: Gait is intact.  Psychiatric:        Mood and Affect: Mood normal.        Behavior: Behavior normal.     UC Treatments / Results  Labs (all labs ordered are listed, but only abnormal results are displayed) Labs Reviewed  POCT FASTING CBG [facility] MANUAL ENTRY - Abnormal; Notable for the following components:      Result Value   POCT Glucose ([facility]) 110 (*)     All other components within normal limits  POC SARS CORONAVIRUS 2 AG -  ED - Normal    EKG   Radiology No results found.  Procedures Procedures (including critical care time)  Medications Ordered in UC Medications - No data to display  Initial Impression / Assessment and Plan / UC Course  I have reviewed the triage vital signs and the nursing notes.  Pertinent labs & imaging results that were available during my care of the patient were reviewed by me and considered in my medical decision making (see chart for details).  Patient presents for complaints of dizziness, lightheadedness, and shaking for the past 2 hours.  On exam, lung sounds are clear throughout, room air sats are at 98%, neurological exam is within normal limits.  EKG performed which shows normal sinus rhythm, orthostatics were negative for orthostatic hypertension.  Patient's blood glucose level is 110.  Advised patient that because her workup has been normal and she is continue to experience symptoms, it is recommended that she go to the emergency department for further evaluation.  Patient states "I am not going to the emergency department."  Patient was advised regarding recommendation for same.  Patient ambulatory at discharge.  Patient discharged to the emergency  department.   Final Clinical Impressions(s) / UC Diagnoses   Final diagnoses:  Near syncope  POTS (postural orthostatic tachycardia syndrome)     Discharge Instructions      Go to the emergency department for further evaluation.    ED Prescriptions   None    PDMP not reviewed this encounter.   Hardy Lia, NP 11/12/23 [phone]

## 2023-11-18 ENCOUNTER — Other Ambulatory Visit: Payer: Self-pay | Admitting: Family

## 2023-11-21 ENCOUNTER — Ambulatory Visit

## 2023-12-01 ENCOUNTER — Encounter: Payer: Self-pay | Admitting: Family

## 2023-12-01 ENCOUNTER — Ambulatory Visit: Admitting: Family

## 2023-12-01 VITALS — BP 122/72 | HR 80 | Temp 98.0°F | Ht 69.0 in | Wt 150.2 lb

## 2023-12-01 DIAGNOSIS — I10 Essential (primary) hypertension: Secondary | ICD-10-CM | POA: Diagnosis not present

## 2023-12-01 DIAGNOSIS — E559 Vitamin D deficiency, unspecified: Secondary | ICD-10-CM

## 2023-12-01 DIAGNOSIS — E785 Hyperlipidemia, unspecified: Secondary | ICD-10-CM

## 2023-12-01 DIAGNOSIS — M797 Fibromyalgia: Secondary | ICD-10-CM

## 2023-12-01 DIAGNOSIS — G4733 Obstructive sleep apnea (adult) (pediatric): Secondary | ICD-10-CM

## 2023-12-01 DIAGNOSIS — R5383 Other fatigue: Secondary | ICD-10-CM | POA: Diagnosis not present

## 2023-12-01 DIAGNOSIS — M7918 Myalgia, other site: Secondary | ICD-10-CM

## 2023-12-01 MED ORDER — GABAPENTIN 300 MG PO CAPS: 300.0000 mg | ORAL_CAPSULE | Freq: Three times a day (TID) | ORAL | Status: DC

## 2023-12-01 NOTE — Progress Notes (Signed)
 Established Patient Office Visit  Subjective:   Patient ID: Melissa [name], female    DOB: Nov 27, 1970  Age: 53 y.o. MRN: [id number]  CC:  Chief Complaint  Patient presents with   Medical Management of Chronic Issues    Reports having aches and pain in legs, arms and back and being extremely tired.    HPI: Melissa [name] is a 53 y.o. female presenting on 12/01/2023 for Medical Management of Chronic Issues (Reports having aches and pain in legs, arms and back and being extremely tired.)  Went to urgent care 5/23 for lightheadedness dizziness and shaking. EKG NSR orthostatics negative. Glucose was 110  Recent sleep study with [facility] with total hypopneas at 19 and total apneas at 10. Interepreted as mild obstructive sleep apnea. She has a lot of excessive daytime fatigue, her husband does state that she snores. Denies headaches in the am.  Snored at a rate of 0 snores in an hour. She states that she is unwilling to wear a CPAP she states that she could not handle this on her face, and refuses a dental appliance because she already wears retainers. She states she wants to do without any treatment at this time.   Psychiatry, placed her on latuda recently which has given her a little more energy and was in a depressive funk. This has helped some with the fatigue and helped her mood wise. She does take this at night time and thinks it has also been helping her to sleep a little bit better.   Has noticed some joint pains and muscle pains, more muscle over joints, she states that her muscles will cramp in her arms and her lower legs at times. She does have muscle tenderness often in her mid upper back which she thinks might contribute. She was taking robaxin  but it wasn't really helping so she stopped taking these. She has muscle cramps often, she has been eating bananas and tries to stay hydrated especially with propel/gatorade for electrolytes. She states the cramps are often throughout  the day, cramping up just slightly not a full spasm, just irritating, been going on for about three weeks. She states she has been having more muscle cramps than normal. She states she will often feel as though she is going to faint or pass out but she doesn't. She has discussed this with the neurologist at last visit, and u/s carotid was ordered this is scheduled in the near future. She does have a f/u coming as well with neurology, per her they were also waiting on the sleep study results. She does check her blood pressure and they seem average in the 120s and diastolic in the 70/80. She is up on her feet all day for her job she doesn't sit down often.   She takes gabapentin  twice daily she doesn't much like how it makes her feel. She does state this causes her to have fatigue and drowsiness makes her feel off balance at times     ROS: Negative unless specifically indicated above in HPI.   Relevant past medical history reviewed and updated as indicated.   Allergies and medications reviewed and updated.   Current Outpatient Medications:    acetaminophen  (TYLENOL ) 500 MG tablet, Take 1,000 mg by mouth daily as needed for mild pain., Disp: , Rfl:    buPROPion  (WELLBUTRIN  XL) 150 MG 24 hr tablet, Take 1 tablet (150 mg total) by mouth daily., Disp: 90 tablet, Rfl: 3   clonazePAM  (KLONOPIN ) 1  MG tablet, Take 1 tablet (1 mg total) by mouth 2 (two) times daily as needed. for anxiety, Disp: 60 tablet, Rfl: 0   fexofenadine (ALLEGRA) 180 MG tablet, Take 180 mg by mouth daily., Disp: , Rfl:    fluvoxaMINE  (LUVOX ) 50 MG tablet, Take 50-100 mg by mouth at bedtime. , Disp: , Rfl:    HYDROcodone -acetaminophen  (NORCO/VICODIN) 5-325 MG tablet, Take 1 tablet by mouth every 6 (six) hours as needed for moderate pain (pain score 4-6)., Disp: , Rfl:    JUNEL  FE 1/20 1-20 MG-MCG tablet, Take 1 tablet by mouth daily., Disp: , Rfl:    lurasidone (LATUDA) 20 MG TABS tablet, Take 20 mg by mouth daily., Disp: , Rfl:     meclizine  (ANTIVERT ) 25 MG tablet, Take 1 tablet (25 mg total) by mouth 3 (three) times daily as needed for dizziness., Disp: 90 tablet, Rfl: 1   omeprazole  (PRILOSEC) 40 MG capsule, TAKE 1 CAPSULE BY MOUTH DAILY, Disp: 90 capsule, Rfl: 0   ondansetron  (ZOFRAN -ODT) 8 MG disintegrating tablet, Take 0.5 tablets (4 mg total) by mouth every 8 (eight) hours as needed for nausea or vomiting., Disp: 30 tablet, Rfl: 11   Ubrogepant  (UBRELVY ) 100 MG TABS, Take 1 tablet (100 mg) by mouth at onset of migraine. May take an additional tablet in 2 hours if needed. Max 200 mg per day, Disp: , Rfl:    verapamil  (CALAN -SR) 120 MG CR tablet, TAKE 1 TABLET BY MOUTH AT BEDTIME, Disp: 90 tablet, Rfl: 1   gabapentin  (NEURONTIN ) 300 MG capsule, Take 1 capsule (300 mg total) by mouth 3 (three) times daily. Take one po qam, take two tablets qhs, Disp: 90 capsule, Rfl: 0  Allergies  Allergen Reactions   Lamotrigine Hives and Swelling   Abilify [Aripiprazole] Other (See Comments)    Weight gain, did not help,    Azithromycin Rash   Depakote [Divalproex Sodium] Other (See Comments)    Hair loss   Erythromycin Base Swelling   Lithium Rash   Penicillins Rash    Has patient had a PCN reaction causing immediate rash, facial/tongue/throat swelling, SOB or lightheadedness with hypotension: Yes- swelling, rash  Has patient had a PCN reaction causing severe rash involving mucus membranes or skin necrosis: Yes Has patient had a PCN reaction that required hospitalization No Has patient had a PCN reaction occurring within the last 10 years: No If all of the above answers are NO, then may proceed with Cephalosporin use.    Pregabalin Palpitations    Manic symptoms, no sleep x's 3 days, no appetite.   Seroquel [Quetiapine Fumarate] Other (See Comments)    Irritable   Sulfa Antibiotics Rash   Tetanus Toxoids Other (See Comments)    Swelling to injection site   Ziprasidone Hcl Other (See Comments)    Heat/cold intolerance.      Objective:   BP 122/72 (BP Location: Left Arm, Patient Position: Sitting)   Pulse 80   Temp 98 F (36.7 C) (Temporal)   Ht 5' 9 (1.753 m)   Wt 150 lb 3.2 oz (68.1 kg)   SpO2 98%   BMI 22.18 kg/m    Physical Exam Vitals reviewed.  Constitutional:      General: She is not in acute distress.    Appearance: Normal appearance. She is normal weight. She is not ill-appearing, toxic-appearing or diaphoretic.  HENT:     Head: Normocephalic.   Cardiovascular:     Rate and Rhythm: Normal rate and regular rhythm.  Pulmonary:  Effort: Pulmonary effort is normal.     Breath sounds: Normal breath sounds.   Musculoskeletal:        General: Normal range of motion.   Neurological:     General: No focal deficit present.     Mental Status: She is alert and oriented to person, place, and time. Mental status is at baseline.   Psychiatric:        Mood and Affect: Mood normal.        Behavior: Behavior normal.        Thought Content: Thought content normal.        Judgment: Judgment normal.     Assessment & Plan:  Mild obstructive sleep apnea Assessment & Plan: Reviewed sleep study with [facility]  Did discuss that pt would likely have improvement of overall symptoms of fatigue with use of cpap but pt declines at this time. She also declines a dental appliance as well. She is aware that her symptoms may not improve without these interventions.    Fibromyalgia  Myalgia, multiple sites Assessment & Plan: Workup to include ck sed rate crp magnesium and cmp to r/o hypokalemia, hypomag, elevated ck and or inflammatory markers.  Advised to increase water intake goal 90 oz daily.    Orders: -     CK -     Sedimentation rate -     C-reactive protein -     Magnesium -     Comprehensive metabolic panel with GFR  Other fatigue Assessment & Plan: Suspect gabapentin  is playing a role Advised pt to cut down from bid to once daily and then can drop med completely  If no  worsening of pains / neuralgias then can stay off and see if the drowsiness/fatigue improves.   Orders: -     CK -     Sedimentation rate -     C-reactive protein -     CBC with Differential/Platelet  Essential hypertension Assessment & Plan: Stable today  Advised pt to continue monitoring.  Continue meds as scheduled.   Orders: -     Comprehensive metabolic panel with GFR  Dyslipidemia Assessment & Plan: Pt advised to:  Work on low cholesterol diet and exercise as tolerated    Vitamin D  deficiency -     TSH -     Vitamin B12 -     VITAMIN D  25 Hydroxy (Vit-D Deficiency, Fractures)     Follow up plan: Return in about 2 months (around 01/31/2024) for follow up muscle pains .  Felicita Horns, FNP

## 2023-12-01 NOTE — Assessment & Plan Note (Signed)
 Stable today  Advised pt to continue monitoring.  Continue meds as scheduled.

## 2023-12-02 ENCOUNTER — Encounter: Payer: Self-pay | Admitting: Family

## 2023-12-02 ENCOUNTER — Ambulatory Visit: Payer: Self-pay | Admitting: Family

## 2023-12-02 DIAGNOSIS — M797 Fibromyalgia: Secondary | ICD-10-CM

## 2023-12-02 LAB — COMPREHENSIVE METABOLIC PANEL WITH GFR
AG Ratio: 1.7 (calc) (ref 1.0–2.5)
ALT: 12 U/L (ref 6–29)
AST: 14 U/L (ref 10–35)
Albumin: 4 g/dL (ref 3.6–5.1)
Alkaline phosphatase (APISO): 56 U/L (ref 37–153)
BUN: 12 mg/dL (ref 7–25)
CO2: 27 mmol/L (ref 20–32)
Calcium: 9 mg/dL (ref 8.6–10.4)
Chloride: 105 mmol/L (ref 98–110)
Creat: 0.99 mg/dL (ref 0.50–1.03)
Globulin: 2.4 g/dL (ref 1.9–3.7)
Glucose, Bld: 73 mg/dL (ref 65–99)
Potassium: 3.8 mmol/L (ref 3.5–5.3)
Sodium: 141 mmol/L (ref 135–146)
Total Bilirubin: 0.4 mg/dL (ref 0.2–1.2)
Total Protein: 6.4 g/dL (ref 6.1–8.1)
eGFR: 68 mL/min/{1.73_m2} (ref >=60)

## 2023-12-02 LAB — CBC WITH DIFFERENTIAL/PLATELET
Absolute Lymphocytes: 1692 {cells}/uL (ref 850–3900)
Absolute Monocytes: 655 {cells}/uL (ref 200–950)
Basophils Absolute: 79 {cells}/uL (ref 0–200)
Basophils Relative: 1.1 %
Eosinophils Absolute: 50 {cells}/uL (ref 15–500)
Eosinophils Relative: 0.7 %
HCT: 40.4 % (ref 35.0–45.0)
Hemoglobin: 13.5 g/dL (ref 11.7–15.5)
MCH: 29.3 pg (ref 27.0–33.0)
MCHC: 33.4 g/dL (ref 32.0–36.0)
MCV: 87.8 fL (ref 80.0–100.0)
MPV: 11.4 fL (ref 7.5–12.5)
Monocytes Relative: 9.1 %
Neutro Abs: 4723 {cells}/uL (ref 1500–7800)
Neutrophils Relative %: 65.6 %
Platelets: 313 10*3/uL (ref 140–400)
RBC: 4.6 10*6/uL (ref 3.80–5.10)
RDW: 13 % (ref 11.0–15.0)
Total Lymphocyte: 23.5 %
WBC: 7.2 10*3/uL (ref 3.8–10.8)

## 2023-12-02 LAB — VITAMIN B12: Vitamin B-12: 539 pg/mL (ref 200–1100)

## 2023-12-02 LAB — C-REACTIVE PROTEIN: CRP: 3.9 mg/L (ref <8.0)

## 2023-12-02 LAB — VITAMIN D 25 HYDROXY (VIT D DEFICIENCY, FRACTURES): Vit D, 25-Hydroxy: 59 ng/mL (ref 30–100)

## 2023-12-02 LAB — CK: Total CK: 73 U/L (ref 21–240)

## 2023-12-02 LAB — SEDIMENTATION RATE: Sed Rate: 2 mm/h (ref 0–30)

## 2023-12-02 LAB — MAGNESIUM: Magnesium: 2.2 mg/dL (ref 1.5–2.5)

## 2023-12-02 LAB — TSH: TSH: 0.68 m[IU]/L

## 2023-12-02 MED ORDER — GABAPENTIN 300 MG PO CAPS: 300.0000 mg | ORAL_CAPSULE | Freq: Three times a day (TID) | ORAL | 0 refills | Status: DC

## 2023-12-06 DIAGNOSIS — R5383 Other fatigue: Secondary | ICD-10-CM | POA: Insufficient documentation

## 2023-12-06 DIAGNOSIS — E559 Vitamin D deficiency, unspecified: Secondary | ICD-10-CM | POA: Insufficient documentation

## 2023-12-06 DIAGNOSIS — M7918 Myalgia, other site: Secondary | ICD-10-CM | POA: Insufficient documentation

## 2023-12-06 NOTE — Assessment & Plan Note (Signed)
 Reviewed sleep study with [facility]  Did discuss that pt would likely have improvement of overall symptoms of fatigue with use of cpap but pt declines at this time. She also declines a dental appliance as well. She is aware that her symptoms may not improve without these interventions.

## 2023-12-06 NOTE — Assessment & Plan Note (Signed)
 Workup to include ck sed rate crp magnesium and cmp to r/o hypokalemia, hypomag, elevated ck and or inflammatory markers.  Advised to increase water intake goal 90 oz daily.

## 2023-12-06 NOTE — Assessment & Plan Note (Signed)
 Suspect gabapentin  is playing a role Advised pt to cut down from bid to once daily and then can drop med completely  If no worsening of pains / neuralgias then can stay off and see if the drowsiness/fatigue improves.

## 2023-12-06 NOTE — Assessment & Plan Note (Signed)
Pt advised to:  Work on low cholesterol diet and exercise as tolerated

## 2023-12-15 ENCOUNTER — Ambulatory Visit: Payer: Self-pay | Admitting: Internal Medicine

## 2023-12-29 ENCOUNTER — Other Ambulatory Visit: Payer: Self-pay | Admitting: Family

## 2024-01-02 ENCOUNTER — Ambulatory Visit

## 2024-01-30 ENCOUNTER — Other Ambulatory Visit: Payer: Self-pay | Admitting: Family

## 2024-01-30 DIAGNOSIS — M797 Fibromyalgia: Secondary | ICD-10-CM

## 2024-02-08 ENCOUNTER — Telehealth: Payer: Self-pay | Admitting: Family

## 2024-02-08 NOTE — Telephone Encounter (Signed)
 Called pt back and advised her that we do not have a Stephanie in our office and I am not sure of the nature of the call she received. Pt verbalized understanding. She did not need anything from us  at this time.

## 2024-02-08 NOTE — Telephone Encounter (Signed)
 Copied from CRM #[id number]. Topic: General - Call Back - No Documentation >> Feb 08, 2024 11:17 AM Frederich PARAS wrote: Reason for CRM: pt called back, in regards to a missed call from stephanie, there were no notes regarding the missed call. Please give pt a call back , leave a message  with the reason for the missed call if possible. Pt call back # [phone] as soon as possible.

## 2024-02-22 ENCOUNTER — Other Ambulatory Visit: Payer: Self-pay | Admitting: Family

## 2024-02-26 ENCOUNTER — Other Ambulatory Visit: Payer: Self-pay

## 2024-02-26 ENCOUNTER — Emergency Department
Admission: EM | Admit: 2024-02-26 | Discharge: 2024-02-26 | Disposition: A | Discharge status: Home / Self Care | Attending: Emergency Medicine | Admitting: Emergency Medicine

## 2024-02-26 DIAGNOSIS — R1010 Upper abdominal pain, unspecified: Secondary | ICD-10-CM | POA: Insufficient documentation

## 2024-02-26 DIAGNOSIS — R197 Diarrhea, unspecified: Secondary | ICD-10-CM | POA: Insufficient documentation

## 2024-02-26 DIAGNOSIS — R1013 Epigastric pain: Secondary | ICD-10-CM | POA: Diagnosis present

## 2024-02-26 LAB — COMPREHENSIVE METABOLIC PANEL WITH GFR
ALT: 14 U/L (ref 0–44)
AST: 21 U/L (ref 15–41)
Albumin: 3.8 g/dL (ref 3.5–5.0)
Alkaline Phosphatase: 54 U/L (ref 38–126)
Anion gap: 12 (ref 5–15)
BUN: 13 mg/dL (ref 6–20)
CO2: 21 mmol/L — ABNORMAL LOW (ref 22–32)
Calcium: 8.6 mg/dL — ABNORMAL LOW (ref 8.9–10.3)
Chloride: 106 mmol/L (ref 98–111)
Creatinine, Ser: 1 mg/dL (ref 0.44–1.00)
GFR, Estimated: >60 mL/min (ref >60)
Glucose, Bld: 104 mg/dL — ABNORMAL HIGH (ref 70–99)
Potassium: 4.1 mmol/L (ref 3.5–5.1)
Sodium: 139 mmol/L (ref 135–145)
Total Bilirubin: 0.5 mg/dL (ref 0.0–1.2)
Total Protein: 7 g/dL (ref 6.5–8.1)

## 2024-02-26 LAB — URINALYSIS, ROUTINE W REFLEX MICROSCOPIC
Bilirubin Urine: NEGATIVE
Glucose, UA: NEGATIVE mg/dL
Hgb urine dipstick: NEGATIVE
Ketones, ur: NEGATIVE mg/dL
Leukocytes,Ua: NEGATIVE
Nitrite: NEGATIVE
Protein, ur: NEGATIVE mg/dL
Specific Gravity, Urine: 1.015 (ref 1.005–1.030)
pH: 5 (ref 5.0–8.0)

## 2024-02-26 LAB — CBC
HCT: 43.5 % (ref 36.0–46.0)
Hemoglobin: 14.2 g/dL (ref 12.0–15.0)
MCH: 28.3 pg (ref 26.0–34.0)
MCHC: 32.6 g/dL (ref 30.0–36.0)
MCV: 86.8 fL (ref 80.0–100.0)
Platelets: 358 K/uL (ref 150–400)
RBC: 5.01 MIL/uL (ref 3.87–5.11)
RDW: 12.8 % (ref 11.5–15.5)
WBC: 10 K/uL (ref 4.0–10.5)
nRBC: 0 % (ref 0.0–0.2)

## 2024-02-26 LAB — LIPASE, BLOOD: Lipase: 48 U/L (ref 11–51)

## 2024-02-26 MED ORDER — SUCRALFATE 1 G PO TABS: 1.0000 g | ORAL_TABLET | Freq: Three times a day (TID) | ORAL | 0 refills | Status: DC | PRN

## 2024-02-26 MED ORDER — LIDOCAINE VISCOUS HCL 2 % MT SOLN
15.0000 mL | Freq: Once | OROMUCOSAL | Status: AC
  Administered 2024-02-26: 15 mL via OROMUCOSAL
  Filled 2024-02-26: qty 15

## 2024-02-26 MED ORDER — DICYCLOMINE HCL 20 MG PO TABS: 20.0000 mg | ORAL_TABLET | Freq: Four times a day (QID) | ORAL | 0 refills | Status: DC | PRN

## 2024-02-26 MED ORDER — ALUM & MAG HYDROXIDE-SIMETH 200-200-20 MG/5ML PO SUSP
30.0000 mL | Freq: Once | ORAL | Status: AC
  Administered 2024-02-26: 30 mL via ORAL
  Filled 2024-02-26: qty 30

## 2024-02-26 MED ORDER — SODIUM CHLORIDE 0.9 % IV BOLUS
1000.0000 mL | Freq: Once | INTRAVENOUS | Status: AC
  Administered 2024-02-26: 1000 mL via INTRAVENOUS

## 2024-02-26 NOTE — ED Triage Notes (Signed)
 Patient states mid abdominal pain and diarrhea since Wednesday night, denies vomiting.

## 2024-02-26 NOTE — ED Provider Notes (Signed)
 [facility] Provider Note    Event Date/Time   First MD Initiated Contact with Patient 02/26/24 1402     (approximate)   History   Abdominal Pain   HPI  Melissa [name] is a 53 year old female with history of GERD, POTS presenting to the emergency department for evaluation of abdominal pain and diarrhea.  On Wednesday, patient had onset of upper abdominal pain worse after eating.  She notes that she has nonbloody diarrhea almost every time she eats.  No nausea or vomiting.  Is concerned that she is getting dehydrated and is prone to syncope when this happens related to her POTS.  Has felt lightheaded but has not had a syncopal episode.     Physical Exam   Triage Vital Signs: ED Triage Vitals  Encounter Vitals Group     BP 02/26/24 1348 101/62     Girls Systolic BP Percentile --      Girls Diastolic BP Percentile --      Boys Systolic BP Percentile --      Boys Diastolic BP Percentile --      Pulse Rate 02/26/24 1348 (!) 108     Resp 02/26/24 1348 18     Temp 02/26/24 1348 98.3 F (36.8 C)     Temp Source 02/26/24 1348 Oral     SpO2 02/26/24 1348 99 %     Weight 02/26/24 1347 152 lb (68.9 kg)     Height 02/26/24 1347 5' 9 (1.753 m)     Head Circumference --      Peak Flow --      Pain Score 02/26/24 1347 4     Pain Loc --      Pain Education --      Exclude from Growth Chart --     Most recent vital signs: Vitals:   02/26/24 1348  BP: 101/62  Pulse: (!) 108  Resp: 18  Temp: 98.3 F (36.8 C)  SpO2: 99%     General: Awake, interactive  CV:  Mild tachycardia, good peripheral perfusion.  Resp:  Unlabored respirations.  Abd:  Nondistended, soft, mild tenderness in the epigastric region, remainder of abdomen nontender, negative Murphy sign Neuro:  Symmetric facial movement, fluid speech   ED Results / Procedures / Treatments   Labs (all labs ordered are listed, but only abnormal results are displayed) Labs Reviewed  COMPREHENSIVE  METABOLIC PANEL WITH GFR - Abnormal; Notable for the following components:      Result Value   CO2 21 (*)    Glucose, Bld 104 (*)    Calcium 8.6 (*)    All other components within normal limits  URINALYSIS, ROUTINE W REFLEX MICROSCOPIC - Abnormal; Notable for the following components:   Color, Urine YELLOW (*)    APPearance HAZY (*)    All other components within normal limits  GASTROINTESTINAL PANEL BY PCR, STOOL (REPLACES STOOL CULTURE)  C DIFFICILE QUICK SCREEN W PCR REFLEX    LIPASE, BLOOD  CBC     EKG EKG independently reviewed and interpreted by myself demonstrates:    RADIOLOGY Imaging independently reviewed and interpreted by myself demonstrates:   Formal Radiology Read:  No results found.  PROCEDURES:  Critical Care performed: No  Procedures   MEDICATIONS ORDERED IN ED: Medications  sodium chloride  0.9 % bolus 1,000 mL (1,000 mLs Intravenous New Bag/Given 02/26/24 1456)  alum & mag hydroxide-simeth (MAALOX/MYLANTA) 200-200-20 MG/5ML suspension 30 mL (30 mLs Oral Given 02/26/24 1456)  lidocaine  (  XYLOCAINE ) 2 % viscous mouth solution 15 mL (15 mLs Mouth/Throat Given 02/26/24 1456)     IMPRESSION / MDM / ASSESSMENT AND PLAN / ED COURSE  I reviewed the triage vital signs and the nursing notes.  Differential diagnosis includes, but is not limited to, PUD, gastritis, GERD, lower suspicion biliary pathology, pancreatitis, other acute intra-abdominal process given reassuring abdominal exam, consideration for electrolyte abnormality  Patient's presentation is most consistent with acute presentation with potential threat to life or bodily function.  53 year old female presenting to the emergency department for evaluation of diarrhea and abdominal pain.  Mild tachycardia on presentation.  Overall reassuring abdominal exam.  Labs with reassuring CBC, CMP.  Urine without evidence of infection.  Normal lipase.  With only mild epigastric tenderness, do not think CT imaging  indicated at this time.  Will treat symptomatically with GI cocktail and IV fluids.  If patient able to tolerate p.o. here, suspect she will be stable for discharge home with supportive care measures.  Patient reassessed and did have some recurrent abdominal cramping but did not have further diarrhea after p.o.  She is comfortable discharge home.  Will DC with prescription for sucralfate  and Bentyl .  Strict return precautions provided.  Patient discharged in stable condition.      FINAL CLINICAL IMPRESSION(S) / ED DIAGNOSES   Final diagnoses:  Pain of upper abdomen  Diarrhea, unspecified type     Rx / DC Orders   ED Discharge Orders          Ordered    dicyclomine  (BENTYL ) 20 MG tablet  Every 6 hours PRN        02/26/24 1710    sucralfate  (CARAFATE ) 1 g tablet  Every 8 hours PRN        02/26/24 1710             Note:  This document was prepared using Dragon voice recognition software and may include unintentional dictation errors.   Levander Slate, MD 02/26/24 [phone]

## 2024-02-26 NOTE — Discharge Instructions (Addendum)
 You were seen in the ER today for evaluation of your abdominal pain and diarrhea.  Your exam and labs were fortunately overall reassuring.  Continue to take your Protonix .  I sent a prescription for 2 medications to your pharmacy to help with your symptoms.  Follow-up your primary care doctor for further evaluation.  Return to the ER for new or worsening symptoms.

## 2024-03-12 ENCOUNTER — Telehealth: Payer: Self-pay | Admitting: Adult Health

## 2024-03-12 NOTE — Telephone Encounter (Signed)
 Patient returned phone call. Patient said would like to cancelled appointment because not having the same problems had before.

## 2024-03-12 NOTE — Telephone Encounter (Signed)
 FYI-Called and LVM for pt to call back and let us  know if she can come in Thrusday at 9am instead of 2pm. Please switch pt if she calls back and agree's or let me know so that I can make the switch.

## 2024-03-15 ENCOUNTER — Ambulatory Visit: Payer: Medicare HMO | Admitting: Adult Health

## 2024-03-22 ENCOUNTER — Ambulatory Visit: Admitting: Family

## 2024-03-22 ENCOUNTER — Encounter: Payer: Self-pay | Admitting: Family

## 2024-03-22 VITALS — BP 122/68 | HR 80 | Temp 97.8°F

## 2024-03-22 DIAGNOSIS — R7989 Other specified abnormal findings of blood chemistry: Secondary | ICD-10-CM

## 2024-03-22 DIAGNOSIS — K429 Umbilical hernia without obstruction or gangrene: Secondary | ICD-10-CM | POA: Diagnosis not present

## 2024-03-22 DIAGNOSIS — T7819XA Other adverse food reactions, not elsewhere classified, initial encounter: Secondary | ICD-10-CM | POA: Diagnosis not present

## 2024-03-22 DIAGNOSIS — K219 Gastro-esophageal reflux disease without esophagitis: Secondary | ICD-10-CM | POA: Diagnosis not present

## 2024-03-22 LAB — T4, FREE: Free T4: 0.83 ng/dL (ref 0.60–1.60)

## 2024-03-22 LAB — T3, FREE: T3, Free: 3.5 pg/mL (ref 2.3–4.2)

## 2024-03-22 LAB — TSH: TSH: 0.86 u[IU]/mL (ref 0.35–5.50)

## 2024-03-22 MED ORDER — PANTOPRAZOLE SODIUM 40 MG PO TBEC: 40.0000 mg | DELAYED_RELEASE_TABLET | Freq: Every day | ORAL | 3 refills | Status: DC

## 2024-03-22 NOTE — Progress Notes (Signed)
 Established Patient Office Visit  Subjective:      CC:  Chief Complaint  Patient presents with   Medical Management of Chronic Issues    Having issues with her stomach, went to the ED about this.    HPI: Melissa [name] is a 53 y.o. female presenting on 03/22/2024 for Medical Management of Chronic Issues (Having issues with her stomach, went to the ED about this.) .  Discussed the use of AI scribe software for clinical note transcription with the patient, who gave verbal consent to proceed.  History of Present Illness Melissa [name] is a 53 year old female who presents with persistent abdominal pain and excessive gas.  She has been experiencing significant abdominal pain and excessive gas for approximately eight weeks. The gas is described as 'really bad' and different from normal, with a sensation of tightness and pressure in the lower abdomen, sometimes feeling as if her stomach has been kicked. The pain worsens after eating, and she experiences non-bloody diarrhea almost every time she eats, although the diarrhea has subsided recently.  She visited the ER on September 7th due to dehydration concerns and was treated with a GI cocktail and IV fluids, which provided some relief. She was discharged with sucralfate  and bentyl  which helped slightly with cramping. She has been on Prilosec 40 mg for decades.  She reports feeling tired all the time, sleeping excessively, and having a lack of energy. She works two days a week, with one day involving physical work and the other day being more sedentary. She has a history of depression and her psychiatrist recently changed her medication, adding Latuda to her regimen. She is still in the process of adjusting to the new medication.  No urinary symptoms such as frequency, urgency, or burning. No history of anemia, and her vitamin D  and B12 levels are good. She has been taking vitamin D  supplements and multivitamins regularly.  She has undergone a  sleep study which indicated mild issues, but she did not snore during the study. She experiences significant fatigue, which impacts her daily activities and social interactions. She feels 'so tired all the time' and struggles to engage in social activities beyond work.  Wt Readings from Last 3 Encounters:  02/26/24 152 lb (68.9 kg)  12/01/23 150 lb 3.2 oz (68.1 kg)  10/24/23 152 lb 9.6 oz (69.2 kg)            Social history:  Relevant past medical, surgical, family and social history reviewed and updated as indicated. Interim medical history since our last visit reviewed.  Allergies and medications reviewed and updated.  DATA REVIEWED: CHART IN EPIC     ROS: Negative unless specifically indicated above in HPI.    Current Outpatient Medications:    acetaminophen  (TYLENOL ) 500 MG tablet, Take 1,000 mg by mouth daily as needed for mild pain., Disp: , Rfl:    buPROPion  (WELLBUTRIN  XL) 150 MG 24 hr tablet, Take 1 tablet (150 mg total) by mouth daily., Disp: 90 tablet, Rfl: 3   clonazePAM  (KLONOPIN ) 1 MG tablet, Take 1 tablet (1 mg total) by mouth 2 (two) times daily as needed. for anxiety, Disp: 60 tablet, Rfl: 0   fexofenadine (ALLEGRA) 180 MG tablet, Take 180 mg by mouth daily., Disp: , Rfl:    fluvoxaMINE  (LUVOX ) 50 MG tablet, Take 50-100 mg by mouth at bedtime. , Disp: , Rfl:    gabapentin  (NEURONTIN ) 300 MG capsule, TAKE 1 CAPSULE BY MOUTH EVERY MORNING AND TAKE  2 CAPSULES AT BEDTIME, Disp: 90 capsule, Rfl: 0   HYDROcodone -acetaminophen  (NORCO/VICODIN) 5-325 MG tablet, Take 1 tablet by mouth every 6 (six) hours as needed for moderate pain (pain score 4-6)., Disp: , Rfl:    JUNEL  FE 1/20 1-20 MG-MCG tablet, Take 1 tablet by mouth daily., Disp: , Rfl:    ondansetron  (ZOFRAN -ODT) 8 MG disintegrating tablet, Take 0.5 tablets (4 mg total) by mouth every 8 (eight) hours as needed for nausea or vomiting., Disp: 30 tablet, Rfl: 11   pantoprazole  (PROTONIX ) 40 MG tablet, Take 1 tablet  (40 mg total) by mouth daily., Disp: 30 tablet, Rfl: 3   verapamil  (CALAN -SR) 120 MG CR tablet, TAKE 1 TABLET BY MOUTH AT BEDTIME, Disp: 90 tablet, Rfl: 1        Objective:        BP 122/68   Pulse 80   Temp 97.8 F (36.6 C) (Temporal)   SpO2 98%   Physical Exam ABDOMEN: Abdomen uncomfortable with pressure, tender on movement. No tenderness in right upper quadrant.  Wt Readings from Last 3 Encounters:  02/26/24 152 lb (68.9 kg)  12/01/23 150 lb 3.2 oz (68.1 kg)  10/24/23 152 lb 9.6 oz (69.2 kg)    Physical Exam Vitals reviewed.  Constitutional:      General: She is not in acute distress.    Appearance: Normal appearance. She is normal weight. She is not ill-appearing, toxic-appearing or diaphoretic.  HENT:     Head: Normocephalic.  Cardiovascular:     Rate and Rhythm: Normal rate.  Pulmonary:     Effort: Pulmonary effort is normal.  Abdominal:     Palpations: Abdomen is soft.     Tenderness: There is abdominal tenderness in the epigastric area and periumbilical area. There is no guarding or rebound.     Hernia: A hernia is present. Hernia is present in the umbilical area (slightly reducible approximately .5 cm).  Musculoskeletal:        General: Normal range of motion.  Neurological:     General: No focal deficit present.     Mental Status: She is alert and oriented to person, place, and time. Mental status is at baseline.  Psychiatric:        Mood and Affect: Mood normal.        Behavior: Behavior normal.        Thought Content: Thought content normal.        Judgment: Judgment normal.          Results LABS CBC: No anemia (02/26/2024) Calcium: Low (02/26/2024) Thyroid : Low (12/2023) Vitamin D : Within normal limits (10/2023) B12: Within normal limits  Assessment & Plan:   Assessment and Plan Assessment & Plan Abdominal pain and excessive gas Persistent abdominal pain and excessive gas for 6-8 weeks, with episodes of non-bloody diarrhea. Pain is  described as pressure and discomfort, primarily in the lower abdomen, with no significant tenderness in the right upper quadrant. Differential diagnosis includes H. pylori infection, alpha-gal syndrome, and possible hernia. Previous ER visit ruled out infection and gallbladder issues. Current symptoms not associated with new medication. - Switch omeprazole  to pantoprazole  to assess for improvement in symptoms. - Order H. pylori test to evaluate for potential infection causing symptoms. - Order alpha-gal test to rule out syndrome causing similar symptoms. - Provide dietary guidance for irritable bowel syndrome to identify potential food triggers. - Prepare CT order for potential imaging if symptoms persist, to evaluate for hernia or other structural issues.  Fatigue Chronic  fatigue with no evidence of anemia or thyroid  dysfunction. Mild obstructive sleep apnea previously identified, with CPAP therapy declined. Fatigue may be exacerbated by depression and lifestyle factors. - Consider CPAP therapy for potential improvement in fatigue symptoms. - Monitor for changes in fatigue with ongoing psychiatric medication adjustments.  Major depressive disorder, single episode, unspecified Depression with significant fatigue and lack of motivation. Recent medication adjustment by psychiatrist, currently in titration phase. Symptoms may contribute to physical manifestations such as fatigue and gastrointestinal issues. - Continue current psychiatric medication regimen and monitor for effectiveness. - Encourage engagement in social activities to improve mood and energy levels.  Obstructive sleep apnea, mild Mild obstructive sleep apnea identified, with CPAP therapy previously declined. Persistent fatigue may benefit from reconsideration of CPAP use. - Reconsider CPAP therapy to address fatigue and improve sleep quality.  Dyslipidemia Dyslipidemia managed with Repatha injections. No current issues with  administration, but she experiences bruising at injection site. - Continue Repatha injections and consider rotating injection sites closer to the belly button or using thighs to reduce bruising.        Return in about 2 weeks (around 04/05/2024) for f/u gerd .     Ginger Patrick, MSN, APRN, FNP-C [facility]

## 2024-03-23 ENCOUNTER — Ambulatory Visit: Payer: Self-pay | Admitting: Family

## 2024-03-23 LAB — H. PYLORI BREATH TEST: H. pylori Breath Test: NOT DETECTED

## 2024-03-26 ENCOUNTER — Encounter: Payer: Self-pay | Admitting: Family

## 2024-03-26 LAB — ALPHA-GAL PANEL
Allergen, Mutton, f88: <0.1 kU/L
Allergen, Pork, f26: <0.1 kU/L
Beef: <0.1 kU/L
CLASS: 0
CLASS: 0
Class: 0
GALACTOSE-ALPHA-1,3-GALACTOSE IGE*: <0.1 kU/L (ref <0.10)

## 2024-03-26 LAB — INTERPRETATION:

## 2024-04-03 NOTE — Telephone Encounter (Signed)
 Can we please see about setting up this CT abd pelvis for the patient? Order is in.

## 2024-04-11 ENCOUNTER — Encounter: Payer: Self-pay | Admitting: Neurology

## 2024-04-17 ENCOUNTER — Other Ambulatory Visit: Payer: Self-pay | Admitting: Family

## 2024-04-17 DIAGNOSIS — M797 Fibromyalgia: Secondary | ICD-10-CM

## 2024-04-26 ENCOUNTER — Telehealth: Payer: Self-pay | Admitting: Family

## 2024-04-26 NOTE — Telephone Encounter (Signed)
 I received a personal message on this as well. I am working on reviewing and obtaining Auth

## 2024-04-26 NOTE — Telephone Encounter (Signed)
 Copied from CRM #[id number]. Topic: General - Other >> Apr 26, 2024  1:54 PM Berneda FALCON wrote: Reason for CRM: Melody from [facility] is calling and requested to speak to the person who does the prior auth for radiology for a CT. Called and spoke to [place] who took the call

## 2024-04-30 ENCOUNTER — Ambulatory Visit
Admission: RE | Admit: 2024-04-30 | Discharge: 2024-04-30 | Disposition: A | Discharge status: Home / Self Care | Source: Ambulatory Visit | Attending: Family | Admitting: Family

## 2024-04-30 DIAGNOSIS — K219 Gastro-esophageal reflux disease without esophagitis: Secondary | ICD-10-CM | POA: Diagnosis present

## 2024-04-30 DIAGNOSIS — K429 Umbilical hernia without obstruction or gangrene: Secondary | ICD-10-CM | POA: Diagnosis present

## 2024-04-30 MED ORDER — IOHEXOL 300 MG/ML  SOLN
100.0000 mL | Freq: Once | INTRAMUSCULAR | Status: AC | PRN
  Administered 2024-04-30: 100 mL via INTRAVENOUS

## 2024-06-26 ENCOUNTER — Other Ambulatory Visit: Payer: Self-pay | Admitting: Family

## 2024-07-02 ENCOUNTER — Ambulatory Visit: Payer: Self-pay

## 2024-07-08 ENCOUNTER — Ambulatory Visit
Admission: RE | Admit: 2024-07-08 | Discharge: 2024-07-08 | Disposition: A | Discharge status: Home / Self Care | Payer: Self-pay | Source: Ambulatory Visit | Attending: Emergency Medicine | Admitting: Emergency Medicine

## 2024-07-08 VITALS — BP 113/76 | HR 102 | Temp 98.0°F | Resp 18

## 2024-07-08 DIAGNOSIS — J069 Acute upper respiratory infection, unspecified: Secondary | ICD-10-CM

## 2024-07-08 MED ORDER — DOXYCYCLINE HYCLATE 100 MG PO CAPS: 100.0000 mg | ORAL_CAPSULE | Freq: Two times a day (BID) | ORAL | 0 refills | Status: AC

## 2024-07-08 NOTE — ED Provider Notes (Signed)
 " [facility]    CSN: [id number] Arrival date & time: 07/08/24  1221      History   Chief Complaint Chief Complaint  Patient presents with   Cough    Possible Bronkitis. Been sick 2 weeks - Entered by patient   Nasal Congestion    HPI Melissa [name] is a 54 y.o. female.   Patient presents for evaluation of low-grade fever, nasal congestion, sinus pressure to the bilateral cheeks, productive cough with green sputum, heaviness to the chest and diarrhea present for 2 weeks.  Sinus pressure has improved.  Initial sore throat has improved and is now more so scratchiness.  Endorsing general fullness to the head into the ears.  Denies presence of shortness of breath or wheezing.  Denies respiratory history, non-smoker.  Decreased appetite due to altered taste but tolerable to some food and liquids.  Known sick contacts at work.  Has been using Mucinex  Tylenol  and DayQuil.     Past Medical History:  Diagnosis Date   Arthritis    Bipolar disorder (HCC)    Fibromyalgia    on disability   Migraine    Muscle spasm    Pain of right side of body    chronic   Panic attack    POTS (postural orthostatic tachycardia syndrome)    Syncope    recurrent   Uterine perforation    hx of    Patient Active Problem List   Diagnosis Date Noted   Vitamin D  deficiency 12/06/2023   Myalgia, multiple sites 12/06/2023   Other fatigue 12/06/2023   Mild obstructive sleep apnea 12/01/2023   Insomnia 06/24/2023   Chronic migraine without aura without status migrainosus, not intractable 02/11/2020   Anxiety 10/23/2019   Essential hypertension 05/09/2017   IBS (irritable bowel syndrome) 09/20/2013   Dyslipidemia 04/03/2013   Osteopenia 04/03/2013   POTS (postural orthostatic tachycardia syndrome) 02/26/2013   Fibromyalgia    Bipolar disorder (HCC)    Gastroesophageal reflux disease without esophagitis 09/10/2011    Past Surgical History:  Procedure Laterality Date   CATARACT EXTRACTION   dec 2014   DILATION AND CURETTAGE OF UTERUS     LASIK  dec 2014    OB History   No obstetric history on file.      Home Medications    Prior to Admission medications  Medication Sig Start Date End Date Taking? Authorizing Provider  acetaminophen  (TYLENOL ) 500 MG tablet Take 1,000 mg by mouth daily as needed for mild pain.    [provider]  buPROPion  (WELLBUTRIN  XL) 150 MG 24 hr tablet Take 1 tablet (150 mg total) by mouth daily. 07/17/19   Aron, Talia M, MD  clonazePAM  (KLONOPIN ) 1 MG tablet Take 1 tablet (1 mg total) by mouth 2 (two) times daily as needed. for anxiety 05/26/16   Aron, Talia M, MD  fexofenadine (ALLEGRA) 180 MG tablet Take 180 mg by mouth daily.    [provider]  fluvoxaMINE  (LUVOX ) 50 MG tablet Take 50-100 mg by mouth at bedtime.  05/23/17   [provider]  gabapentin  (NEURONTIN ) 300 MG capsule TAKE 1 CAPSULE BY MOUTH EVERY MORNING AND TAKE 2 CAPSULES AT BEDTIME 04/19/24   Corwin Antu, FNP  HYDROcodone -acetaminophen  (NORCO/VICODIN) 5-325 MG tablet Take 1 tablet by mouth every 6 (six) hours as needed for moderate pain (pain score 4-6).    [provider]  JUNEL  FE 1/20 1-20 MG-MCG tablet Take 1 tablet by mouth daily. 11/25/22   [provider]  ondansetron  (ZOFRAN -ODT) 8 MG disintegrating tablet Take 0.5 tablets (4 mg total) by mouth every 8 (eight) hours as needed for nausea or vomiting. 08/18/21   Ines Onetha NOVAK, MD  pantoprazole  (PROTONIX ) 40 MG tablet Take 1 tablet (40 mg total) by mouth daily. 03/22/24   Corwin Antu, FNP  verapamil  (CALAN -SR) 120 MG CR tablet TAKE 1 TABLET BY MOUTH AT BEDTIME 06/26/24   Corwin Antu, FNP    Family History Family History  Problem Relation Age of Onset   Stroke Mother    Diabetes Mother    Hypertension Mother    Brain cancer Father    Healthy Sister    Ovarian cancer Maternal Grandmother        metastasized   Lung cancer Maternal Grandfather        smoker   Cancer Paternal  Grandmother        not sure of the type   Cancer Paternal Grandfather        not sure of the type   Migraines Neg Hx     Social History Social History[1]   Allergies   Lamotrigine, Abilify [aripiprazole], Azithromycin, Depakote [divalproex sodium], Erythromycin base, Lithium, Penicillins, Pregabalin, Seroquel [quetiapine fumarate], Sulfa antibiotics, Tetanus toxoid-containing vaccines, and Ziprasidone hcl   Review of Systems Review of Systems  Constitutional:  Positive for fever. Negative for activity change, appetite change, chills, diaphoresis, fatigue and unexpected weight change.  HENT:  Positive for congestion and sinus pressure. Negative for dental problem, drooling, ear discharge, ear pain, facial swelling, hearing loss, mouth sores, nosebleeds, postnasal drip, rhinorrhea, sinus pain, sneezing, sore throat, tinnitus, trouble swallowing and voice change.   Respiratory:  Positive for cough. Negative for apnea, choking, chest tightness, shortness of breath, wheezing and stridor.   Gastrointestinal:  Positive for diarrhea. Negative for abdominal distention, abdominal pain, anal bleeding, blood in stool, constipation, nausea, rectal pain and vomiting.     Physical Exam Triage Vital Signs ED Triage Vitals  Encounter Vitals Group     BP 07/08/24 1227 113/76     Girls Systolic BP Percentile --      Girls Diastolic BP Percentile --      Boys Systolic BP Percentile --      Boys Diastolic BP Percentile --      Pulse Rate 07/08/24 1227 (!) 102     Resp 07/08/24 1227 18     Temp 07/08/24 1227 98 F (36.7 C)     Temp Source 07/08/24 1227 Oral     SpO2 07/08/24 1227 98 %     Weight --      Height --      Head Circumference --      Peak Flow --      Pain Score 07/08/24 1231 0     Pain Loc --      Pain Education --      Exclude from Growth Chart --    No data found.  Updated Vital Signs BP 113/76 (BP Location: Right Arm)   Pulse (!) 102   Temp 98 F (36.7 C) (Oral)   Resp  18   SpO2 98%   Visual Acuity Right Eye Distance:   Left Eye Distance:   Bilateral Distance:    Right Eye Near:   Left Eye Near:    Bilateral Near:     Physical Exam Constitutional:      Appearance: Normal appearance.  HENT:     Right Ear: Tympanic membrane, ear canal and  external ear normal.     Left Ear: Tympanic membrane, ear canal and external ear normal.     Nose: Congestion present.     Mouth/Throat:     Pharynx: No oropharyngeal exudate or posterior oropharyngeal erythema.  Eyes:     Extraocular Movements: Extraocular movements intact.  Cardiovascular:     Rate and Rhythm: Normal rate and regular rhythm.     Pulses: Normal pulses.     Heart sounds: Normal heart sounds.  Pulmonary:     Effort: Pulmonary effort is normal.     Breath sounds: Normal breath sounds.  Musculoskeletal:     Cervical back: Normal range of motion and neck supple.  Neurological:     Mental Status: She is alert and oriented to person, place, and time. Mental status is at baseline.      UC Treatments / Results  Labs (all labs ordered are listed, but only abnormal results are displayed) Labs Reviewed - No data to display  EKG   Radiology No results found.  Procedures Procedures (including critical care time)  Medications Ordered in UC Medications - No data to display  Initial Impression / Assessment and Plan / UC Course  I have reviewed the triage vital signs and the nursing notes.  Pertinent labs & imaging results that were available during my care of the patient were reviewed by me and considered in my medical decision making (see chart for details).  Acute URI  Patient is in no signs of distress nor toxic appearing.  Vital signs are stable.  Low suspicion for pneumonia, pneumothorax or bronchitis and therefore will defer imaging.  Symptoms present for 2 weeks, prescribed doxycycline  declined use of prednisone  and cough medicine.May use additional over-the-counter medications  as needed for supportive care.  May follow-up with urgent care as needed if symptoms persist or worsen.   Final Clinical Impressions(s) / UC Diagnoses   Final diagnoses:  None   Discharge Instructions   None    ED Prescriptions   None    PDMP not reviewed this encounter.     [1]  Social History Tobacco Use   Smoking status: Never   Smokeless tobacco: Never  Vaping Use   Vaping status: Never Used  Substance Use Topics   Alcohol use: No    Alcohol/week: 0.0 standard drinks of alcohol   Drug use: No     Teresa Shelba SAUNDERS, NP 07/08/24 1300  "

## 2024-07-08 NOTE — ED Triage Notes (Signed)
 Patient reports cough with green mucus and sinus congestion x 2 weeks. Patient has taken mucinex , Tylenol , and Dayquil/Nyquil with no relief.

## 2024-07-08 NOTE — Discharge Instructions (Signed)
 Symptoms present for 2 weeks, begin doxycycline  twice daily for 7 days for treatment of bacteria causing symptoms to linger    You can take Tylenol  and/or Ibuprofen  as needed for fever reduction and pain relief.   For cough: honey 1/2 to 1 teaspoon (you can dilute the honey in water or another fluid).  You can also use guaifenesin  and dextromethorphan for cough. You can use a humidifier for chest congestion and cough.  If you don't have a humidifier, you can sit in the bathroom with the hot shower running.      For sore throat: try warm salt water gargles, cepacol lozenges, throat spray, warm tea or water with lemon/honey, popsicles or ice, or OTC cold relief medicine for throat discomfort.   For congestion: take a daily anti-histamine like Zyrtec, Claritin, and a oral decongestant, such as pseudoephedrine.  You can also use Flonase 1-2 sprays in each nostril daily.   It is important to stay hydrated: drink plenty of fluids (water, gatorade/powerade/pedialyte, juices, or teas) to keep your throat moisturized and help further relieve irritation/discomfort.

## 2024-07-18 ENCOUNTER — Encounter: Payer: Self-pay | Admitting: Family

## 2024-07-18 DIAGNOSIS — K219 Gastro-esophageal reflux disease without esophagitis: Secondary | ICD-10-CM

## 2024-07-18 MED ORDER — PANTOPRAZOLE SODIUM 40 MG PO TBEC: 40.0000 mg | DELAYED_RELEASE_TABLET | Freq: Every day | ORAL | 1 refills | Status: AC
